# Patient Record
Sex: Female | Born: 1935
Health system: Southern US, Community
[De-identification: ages and names within clinical notes are randomized; demographics above are authoritative.]

## PROBLEM LIST (undated history)

## (undated) DIAGNOSIS — J309 Allergic rhinitis, unspecified: Secondary | ICD-10-CM

## (undated) DIAGNOSIS — H8110 Benign paroxysmal vertigo, unspecified ear: Secondary | ICD-10-CM

## (undated) DIAGNOSIS — K869 Disease of pancreas, unspecified: Secondary | ICD-10-CM

## (undated) DIAGNOSIS — Z780 Asymptomatic menopausal state: Secondary | ICD-10-CM

## (undated) DIAGNOSIS — I10 Essential (primary) hypertension: Secondary | ICD-10-CM

## (undated) DIAGNOSIS — K219 Gastro-esophageal reflux disease without esophagitis: Secondary | ICD-10-CM

## (undated) DIAGNOSIS — E119 Type 2 diabetes mellitus without complications: Secondary | ICD-10-CM

## (undated) DIAGNOSIS — I2510 Atherosclerotic heart disease of native coronary artery without angina pectoris: Secondary | ICD-10-CM

## (undated) DIAGNOSIS — M6580 Other synovitis and tenosynovitis, unspecified site: Secondary | ICD-10-CM

## (undated) DIAGNOSIS — I350 Nonrheumatic aortic (valve) stenosis: Secondary | ICD-10-CM

## (undated) DIAGNOSIS — M8580 Other specified disorders of bone density and structure, unspecified site: Secondary | ICD-10-CM

## (undated) DIAGNOSIS — M069 Rheumatoid arthritis, unspecified: Secondary | ICD-10-CM

## (undated) DIAGNOSIS — D649 Anemia, unspecified: Secondary | ICD-10-CM

## (undated) DIAGNOSIS — M1990 Unspecified osteoarthritis, unspecified site: Secondary | ICD-10-CM

## (undated) DIAGNOSIS — M4800 Spinal stenosis, site unspecified: Secondary | ICD-10-CM

## (undated) DIAGNOSIS — E782 Mixed hyperlipidemia: Secondary | ICD-10-CM

## (undated) DIAGNOSIS — M659 Synovitis and tenosynovitis, unspecified: Secondary | ICD-10-CM

## (undated) DIAGNOSIS — I219 Acute myocardial infarction, unspecified: Secondary | ICD-10-CM

## (undated) DIAGNOSIS — R918 Other nonspecific abnormal finding of lung field: Secondary | ICD-10-CM

## (undated) HISTORY — DX: Other synovitis and tenosynovitis, unspecified site: M65.80

## (undated) HISTORY — DX: Mixed hyperlipidemia: E78.2

## (undated) HISTORY — PX: APPENDECTOMY: SHX54

## (undated) HISTORY — DX: Essential (primary) hypertension: I10

## (undated) HISTORY — DX: Asymptomatic menopausal state: Z78.0

## (undated) HISTORY — DX: Benign paroxysmal vertigo, unspecified ear: H81.10

## (undated) HISTORY — DX: Allergic rhinitis, unspecified: J30.9

## (undated) HISTORY — PX: TONSILLECTOMY: SUR1361

## (undated) HISTORY — DX: Spinal stenosis, site unspecified: M48.00

## (undated) HISTORY — DX: Atherosclerotic heart disease of native coronary artery without angina pectoris: I25.10

## (undated) HISTORY — DX: Synovitis and tenosynovitis, unspecified: M65.9

## (undated) HISTORY — DX: Other specified disorders of bone density and structure, unspecified site: M85.80

## (undated) HISTORY — PX: EYE SURGERY: SHX253

## (undated) HISTORY — PX: OTHER SURGICAL HISTORY: SHX169

## (undated) HISTORY — PX: DILATION AND CURETTAGE OF UTERUS: SHX78

## (undated) HISTORY — DX: Rheumatoid arthritis, unspecified: M06.9

## (undated) HISTORY — PX: ABDOMINAL HYSTERECTOMY: SHX81

---

## 1999-02-17 ENCOUNTER — Ambulatory Visit (HOSPITAL_BASED_OUTPATIENT_CLINIC_OR_DEPARTMENT_OTHER): Admission: RE | Admit: 1999-02-17 | Discharge: 1999-02-17 | Payer: Self-pay | Admitting: Orthopedic Surgery

## 2001-09-07 ENCOUNTER — Inpatient Hospital Stay (HOSPITAL_COMMUNITY): Admission: RE | Admit: 2001-09-07 | Discharge: 2001-09-08 | Payer: Self-pay | Admitting: Internal Medicine

## 2001-09-07 ENCOUNTER — Encounter (INDEPENDENT_AMBULATORY_CARE_PROVIDER_SITE_OTHER): Payer: Self-pay | Admitting: Specialist

## 2001-09-07 ENCOUNTER — Encounter: Payer: Self-pay | Admitting: Internal Medicine

## 2001-10-02 ENCOUNTER — Encounter: Payer: Self-pay | Admitting: Surgery

## 2001-10-02 ENCOUNTER — Ambulatory Visit (HOSPITAL_COMMUNITY): Admission: RE | Admit: 2001-10-02 | Discharge: 2001-10-02 | Payer: Self-pay | Admitting: Surgery

## 2002-02-18 ENCOUNTER — Encounter: Payer: Self-pay | Admitting: Surgery

## 2002-02-18 ENCOUNTER — Ambulatory Visit (HOSPITAL_COMMUNITY): Admission: RE | Admit: 2002-02-18 | Discharge: 2002-02-18 | Payer: Self-pay | Admitting: Surgery

## 2002-05-30 DIAGNOSIS — I219 Acute myocardial infarction, unspecified: Secondary | ICD-10-CM

## 2002-05-30 HISTORY — PX: CARDIAC CATHETERIZATION: SHX172

## 2002-05-30 HISTORY — PX: CORONARY ANGIOPLASTY: SHX604

## 2002-05-30 HISTORY — DX: Acute myocardial infarction, unspecified: I21.9

## 2003-01-14 ENCOUNTER — Inpatient Hospital Stay (HOSPITAL_COMMUNITY): Admission: RE | Admit: 2003-01-14 | Discharge: 2003-01-15 | Payer: Self-pay | Admitting: *Deleted

## 2003-01-27 ENCOUNTER — Encounter (HOSPITAL_COMMUNITY): Admission: RE | Admit: 2003-01-27 | Discharge: 2003-02-27 | Payer: Self-pay | Admitting: *Deleted

## 2004-09-27 ENCOUNTER — Encounter (INDEPENDENT_AMBULATORY_CARE_PROVIDER_SITE_OTHER): Payer: Self-pay | Admitting: *Deleted

## 2004-09-27 ENCOUNTER — Ambulatory Visit (HOSPITAL_COMMUNITY): Admission: RE | Admit: 2004-09-27 | Discharge: 2004-09-27 | Payer: Self-pay | Admitting: Gastroenterology

## 2004-10-28 ENCOUNTER — Encounter: Admission: RE | Admit: 2004-10-28 | Discharge: 2004-10-28 | Payer: Self-pay | Admitting: Orthopaedic Surgery

## 2004-11-16 ENCOUNTER — Ambulatory Visit (HOSPITAL_COMMUNITY): Admission: RE | Admit: 2004-11-16 | Discharge: 2004-11-16 | Payer: Self-pay | Admitting: Orthopaedic Surgery

## 2008-05-09 ENCOUNTER — Encounter: Admission: RE | Admit: 2008-05-09 | Discharge: 2008-05-09 | Payer: Self-pay | Admitting: Internal Medicine

## 2008-10-09 ENCOUNTER — Ambulatory Visit (HOSPITAL_COMMUNITY): Admission: RE | Admit: 2008-10-09 | Discharge: 2008-10-09 | Payer: Self-pay | Admitting: Orthopaedic Surgery

## 2010-01-05 ENCOUNTER — Encounter: Admission: RE | Admit: 2010-01-05 | Discharge: 2010-01-05 | Payer: Self-pay | Admitting: Internal Medicine

## 2010-06-20 ENCOUNTER — Encounter: Payer: Self-pay | Admitting: Internal Medicine

## 2010-09-07 LAB — BASIC METABOLIC PANEL
BUN: 12 mg/dL (ref 6–23)
CO2: 30 mEq/L (ref 19–32)
Calcium: 9.6 mg/dL (ref 8.4–10.5)
Chloride: 99 mEq/L (ref 96–112)
Creatinine, Ser: 0.81 mg/dL (ref 0.4–1.2)
GFR calc Af Amer: >60 mL/min (ref >60)
GFR calc non Af Amer: >60 mL/min (ref >60)
Glucose, Bld: 132 mg/dL — ABNORMAL HIGH (ref 70–99)
Potassium: 3.9 mEq/L (ref 3.5–5.1)
Sodium: 137 mEq/L (ref 135–145)

## 2010-09-07 LAB — CBC
HCT: 42.8 % (ref 36.0–46.0)
Hemoglobin: 14.5 g/dL (ref 12.0–15.0)
MCHC: 33.8 g/dL (ref 30.0–36.0)
MCV: 88.5 fL (ref 78.0–100.0)
Platelets: 297 10*3/uL (ref 150–400)
RBC: 4.83 MIL/uL (ref 3.87–5.11)
RDW: 13.3 % (ref 11.5–15.5)
WBC: 12.3 10*3/uL — ABNORMAL HIGH (ref 4.0–10.5)

## 2010-10-12 NOTE — Op Note (Signed)
Sophia Ward, Sophia Ward            ACCOUNT NO.:  [id number]   MEDICAL RECORD NO.:  [id number]          PATIENT TYPE:  AMB   LOCATION:  SDS                          FACILITY:  [facility]   PHYSICIAN:  Lubertha Basque. Dalldorf, M.D.DATE OF BIRTH:  03-03-1933   DATE OF PROCEDURE:  10/09/2008  DATE OF DISCHARGE:  10/09/2008                               OPERATIVE REPORT   POSTOPERATIVE DIAGNOSES:  1. Right knee torn medial meniscus.  2. Right knee chondromalacia.   POSTOPERATIVE DIAGNOSES:  1. Right knee torn medial and torn lateral meniscus.  2. Right knee chondromalacia.   PROCEDURES:  1. Right knee partial medial and partial lateral meniscectomy.  2. Right knee abrasion chondroplasty patellofemoral.   ANESTHESIA:  General.   ATTENDING SURGEON:  Lubertha Basque. Jerl Santos, MD   ASSISTANT:  Lindwood Qua, PA   INDICATIONS FOR PROCEDURE:  The patient is a 78 year old woman with many  months of right knee pain and swelling.  This has persisted despite oral  anti-inflammatories and rest and bracing.  She has undergone an MRI scan  which shows a medial meniscus tear and some mild degenerative changes.  She had good joint space maintenance on plain films and our hope is that  an arthroscopy will improve her situation significantly.  Informed  operative consent was obtained after discussion of possible  complications including reaction to anesthesia and infection.   SUMMARY OF FINDINGS AND PROCEDURE:  Under general anesthesia, an  arthroscopy of the right knee was performed.  This was accomplished in  the main operating room due to some cardiac concerns.  She had a  posterior horn medial meniscus tear and a free edge lateral meniscus  tear both of which were addressed with partial meniscectomies.  She had  some chondromalacia grade 3 medial and grade 4 patellofemoral addressed  with thorough chondroplasties and abrasion to bleeding bone  patellofemoral.   DESCRIPTION OF PROCEDURE:  The patient was  taken to the operating suite  where a general anesthetic was applied without difficulty.  She was  positioned supine and prepped and draped in normal sterile fashion.  After the administration of IV Kefzol, an arthroscopy of the right knee  was performed with two portals.  Findings were as noted above and  procedure consisted of a partial medial and partial lateral  meniscectomies done with basket and shaver removing no more than 5% of  these structures.  A thorough chondroplasty was done medial and  patellofemoral along with abrasion to bleeding bone patellofemoral in  some small areas.  The knee was thoroughly irrigated followed by  placement of a sterile dressing and Ace wrap.  Estimated blood loss and  intraoperative fluid obtained from anesthesia records.   DISPOSITION:  The patient was extubated in the operating room and taken  to recovery room in stable addition.  Plans were for her to go home the  same-day and follow up in the office next week.  I will contact her by  phone tonight.      Lubertha Basque Jerl Santos, M.D.  Electronically Signed     PGD/MEDQ  D:  10/09/2008  T:  10/09/2008  Job:  [id number]

## 2010-10-15 NOTE — Op Note (Signed)
Sophia Ward, Sophia Ward            ACCOUNT NO.:  [id number]   MEDICAL RECORD NO.:  [id number]          PATIENT TYPE:  OIB   LOCATION:  2892                         FACILITY:  [facility]   PHYSICIAN:  Lubertha Basque. Dalldorf, M.D.DATE OF BIRTH:  1932-09-22   DATE OF PROCEDURE:  11/16/2004  DATE OF DISCHARGE:                                 OPERATIVE REPORT   PREOPERATIVE DIAGNOSES:  1. Right shoulder impingement.  2. Right shoulder partial rotator cuff tear.   POSTOPERATIVE DIAGNOSES:  1. Right shoulder impingement.  2. Right shoulder partial rotator cuff tear.   PROCEDURES:  1. Right shoulder arthroscopic acromioplasty.  2. Right shoulder arthroscopic debridement of partial rotator cuff tear      and partial subscapularis tear.   ANESTHESIA:  General.   ATTENDING SURGEON:  Lubertha Basque. Jerl Santos, M.D.   INDICATIONS FOR PROCEDURE:  The patient is a 77 year old woman with a long  history of bilateral shoulder pain. The left shoulder has done well with two  subacromial injections and an exercise program while on the right she has  not responded to subacromial injection nor to the exercise program. She has  persisted with pain at rest and pain with activities, especially with  overhead use. She has undergone an MRI scan of the right shoulder which  shows some irritation of her rotator cuff consistent with impingement, but  certainly no full-thickness rotator cuff tear. She is offered an  arthroscopy. Informed operative consent was obtained after discussion of  possible complications of reaction to anesthesia and infection.   DESCRIPTION OF PROCEDURE:  The patient was taken to the operating suite  where general anesthetic was supplied without difficulty. She was positioned  in a beach chair position and prepped and draped in normal sterile fashion.  After the administration of IV antibiotic, which was clindamycin, an  arthroscopy of the right shoulder was performed through a total of three  portals. The glenohumeral joint showed no degenerative changes and the  biceps tendon appeared intact. She did have some partial tearing of her  subscapularis and a debridement was done through the additional anterior  portal, sacrificing about 10% of this tendon back to stable tissues. In the  rotator cuff at the insertion site, there was also some fraying consistent  with a partial-thickness tear constituting approximately 25% of the  thickness of the rotator cuff. This too was debrided. In the subacromial  space, no significant tearing could be seen. She had a prominent subacromial  morphology addressed with an acromioplasty back to a flat surface. This was  done with a bur in the lateral position followed by transfer of the bur to  the posterior position. A bursectomy was performed and the cuff again was  examined. No full-thickness nor significant partial-thickness tear from the  bursal side was seen. She did have a mild prominence of the distal clavicle,  which I excised, but I did not perform an AC decompression formally. The  shoulder was thoroughly irrigated, followed by placement of  Marcaine  without epinephrine. Simple sutures of nylon were used to loosely  reapproximate the portals, followed by Adaptic and  dry gauze dressing with  tape. The estimated blood loss and intraoperative fluids can be obtained  from the anesthesia records.   DISPOSITION:  The patient was extubated in the operating room and taken to  the recovery room in stable addition. Plans were for her to go home the same  day and follow up in the office in less than a week. I will contact her by  phone tonight.       PGD/MEDQ  D:  11/16/2004  T:  11/16/2004  Job:  [id number]

## 2010-10-15 NOTE — Discharge Summary (Signed)
Sophia Ward, Sophia Ward                      ACCOUNT NO.:  [id number]   MEDICAL RECORD NO.:  [id number]                   PATIENT TYPE:  INP   LOCATION:  6527                                 FACILITY:  [facility]   PHYSICIAN:  Meade Maw, M.D.                 DATE OF BIRTH:  1933-02-13   DATE OF ADMISSION:  01/13/2003  DATE OF DISCHARGE:  01/15/2003                                 DISCHARGE SUMMARY   ADMISSION DIAGNOSES:  1. Recurrent angina.  2. Coronary artery disease status post left anterior descending and right     coronary artery PCI.  3. Hyperlipidemia.  4. Hypertension.   DISCHARGE DIAGNOSES:  1. Cardiac catheterization, elective, revealing left anterior descending in-     stent restenosis as well as progressive ________ left anterior descending     disease.  2. Vasovagal syncope, resolved.  3. Recurrent angina.  4. Coronary artery disease status post left anterior descending and right     coronary artery PCI.  5. Hyperlipidemia.  6. Hypertension.   HISTORY OF PRESENT ILLNESS:  Sophia Ward is a very pleasant 77 year old  married white female with known coronary artery disease.  She has had a  Cypher stent placed in the LAD and RCA Vision stenting.  She had a stress  test following these interventions (which were performed in [place], [place])  on December 04, 2002 and this revealed minimal decreased uptake in the anterior  wall with minimal reversal noted.  May be a residual effect from her  previously occluded LAD.  Normal wall motion with an EF of 75%.  In that the  patient had not had any typical angina, will not proceed with further work-  up at this time.   The patient was seen again on January 07, 2003 by Meade Maw, M.D. and at  this time was complaining of recurrent chest pain with exertion resolved  with rest.  No associated nausea, vomiting, or diaphoresis.  The pain lasts  less than five minutes in nature and she has not required any nitroglycerin.  However,  this has been ongoing for the last two to three weeks.   Based on this information, abnormal Cardiolite in June, and known coronary  artery disease, Meade Maw, M.D. recommended repeat cardiac  catheterization.  She also recommended Imdur therapy and continuation of  aspirin and Plavix.  The patient was to make a decision about whether or not  she wanted to proceed with repeat catheterization.  She ultimately decided  to do so.  The risks and indications were reviewed with the patient and she  agreed to proceed.   PROCEDURE:  1. Cardiac catheterization by Meade Maw, M.D. on January 13, 2003     revealing left anterior descending disease.  2. Percutaneous intervention by Francisca December, M.D. on January 13, 2003 to     the left anterior descending.   COMPLICATIONS:  None.   CONSULTATIONS:  Cardiac rehabilitation.   HOSPITAL COURSE:  Sophia Ward was admitted to [facility] for  elective cardiac catheterization on January 13, 2003 by Meade Maw, M.D.  Pre procedure laboratory studies were within acceptable limits.   Cardiac catheterization performed by Meade Maw, M.D. revealed a left  main that bifurcated into the LAD and circumflex.  The LAD had 80-90% in-  stent restenosis and had progression of disease towards the proximal portion  of the LAD involving a diagonal 1.  The circumflex had a 20-30% proximal  lesion.  The RCA had 20% proximal lesion.  The stented segment remained  patent.  LV gram revealed an EF of 65-70% and aortic pressures were 158/74.   She consulted with Francisca December, M.D. who agreed to perform intervention  to the LAD.  He performed balloon angioplasty to the in-stent restenosis and  placed a Taxus 3.0 x 20 mm stent in the proximal portion of the LAD.  Typical angina was reproduced.  The patient tolerated procedure well and had  good distal pulses.  Angiomax was used during the procedure.   On January 14, 2003 the patient stated that she was  feeling okay, felt a  little bit gassy/indigestion type symptoms.  Right groin was stable without  hematoma or bruit and minimal ecchymosis.   The patient ambulated in the hallway and tolerated this well.  Because she  did, she was allowed to go and visit her husband on the 5[address] unit by  wheelchair with family.  He was admitted on the same day for abdominal pain.  Apparently, when the patient returned she felt lightheaded, pale, and  clammy.  The patient became hot and nauseated.  When the patient was  returned to her room she had a syncopal spell while sitting in the  wheelchair.  She was placed back in bed by the nursing staff and put in  Trendelenburg.  Blood pressure was 138/57.  Heart rate was 48.  Meade Maw, M.D. was called to see the patient.  Meade Maw, M.D. felt the  patient had a vasovagal episode and recommended additional 24-hour inpatient  monitoring.   The patient had no further episodes of lightheadedness, dizziness, or  syncope.   On January 14, 2003 she was seen by cardiac rehabilitation and ambulated well  without any chest discomfort, lightheadedness, or dizziness.  Hemodynamically stable.   On January 15, 2003 the patient was felt stable for discharge to home by  Meade Maw, M.D.  Right groin stable with trace ecchymosis.   DISCHARGE MEDICATIONS:  1. Altace 10 mg b.i.d.  2. Toprol XL 100 mg daily.  3. Hydrochlorothiazide 25 mg.  4. Aspirin 325 mg.  5. Plavix 75 mg.  6. Prevacid 30 mg.  7. Zocor 40 mg at bedtime.  8. Nitroglycerin 0.4 mg one under the tongue every five minutes as needed     for chest pain.  9. TriCor 160 mg daily.   ACTIVITY:  No strenuous activity, lifting more than 5 pounds, or driving for  two days.   DIET:  Low fat, low cholesterol, low salt diet.   WOUND CARE:  May shower.   DISCHARGE INSTRUCTIONS:  Call the office with problems or questions.   FOLLOWUP:  With Dr Fraser Din on Tuesday, August 31 at 1:00.      Georgiann Cocker  Jernejcic, P.A.                   Meade Maw, M.D.  TCJ/MEDQ  D:  01/15/2003  T:  01/16/2003  Job:  [id number]   cc:   Theressa Millard, M.D.  [address] [place]  [place] [address]  Fax: [phone]

## 2010-10-15 NOTE — Op Note (Signed)
[name], [name]            ACCOUNT NO.:  [id number]   MEDICAL RECORD NO.:  [id number]          PATIENT TYPE:  AMB   LOCATION:  ENDO                         FACILITY:  [facility]   PHYSICIAN:  Danise Edge, M.D.   DATE OF BIRTH:  1932-07-02   DATE OF PROCEDURE:  09/27/2004  DATE OF DISCHARGE:                                 OPERATIVE REPORT   PROCEDURE:  Esophagogastroduodenoscopy colonoscopy and sigmoid polypectomy.   INDICATIONS:  Ms. Sophia Ward is a 77 year old female born May 25, 1933. Sophia Ward has chronic gastroesophageal reflux disease (heartburn)  associated with intermittent dysphagia. She is currently taking Prevacid and  for the most part is heartburn free. She is also experiencing right lower  quadrant abdominal discomfort and is due for a screening colonoscopy with  polypectomy to prevent colon cancer.   ENDOSCOPIST:  Danise Edge, M.D.   PREMEDICATION:  Versed 7 mg, Demerol 70 mg.   PROCEDURE:  DIAGNOSTIC ESOPHAGOGASTRODUODENOSCOPY:  After obtaining informed  consent, Sophia Ward was placed in the left lateral decubitus position. I  administered intravenous Demerol and intravenous Versed to achieve conscious  sedation for the procedure. The patient's blood pressure, oxygen saturation  and cardiac rhythm were monitored throughout the procedure and documented in  the medical record.   The Olympus gastroscope was passed through the posterior hypopharynx into  the proximal esophagus without difficulty. The hypopharynx, larynx and vocal  cords appeared normal.   ESOPHAGOSCOPY:  The proximal, mid and lower segments of the esophageal  mucosa appear completely normal. The squamocolumnar junction and  esophagogastric junction are noted at 38 cm from the incisor teeth. There is  no endoscopic evidence for the presence of Barrett's esophagus, erosive  esophagitis or esophageal mucosal scarring.   GASTROSCOPY:  Retroflex view of the gastric cardia and  fundus was normal.  The gastric body, antrum and pylorus appeared normal.   DUODENOSCOPY:  The duodenal bulb and descending duodenum appear normal.   ASSESSMENT:  Chronic gastroesophageal reflux associated with a completely  normal esophagogastroduodenoscopy.   PROCEDURE:  COLONOSCOPY AND SIGMOID POLYPECTOMY:  Anal inspection and  digital rectal exam were normal. The Olympus adjustable pediatric  colonoscope was introduced into the rectum and easily advanced to the cecum.  A normal appearing ileocecal valve and appendiceal orifice were identified.  Colonic preparation for the exam today was excellent.   RECTUM:  Normal. Retroflexed view of the distal rectum normal.  SIGMOID COLON AND DESCENDING COLON:  From the distal sigmoid colon at 15 cm  from the anal verge, a 1 mm - 2 mm size polyp was removed with cold biopsy  forceps.  SPLENIC FLEXURE:  Normal.  TRANSVERSE COLON:  Normal.  HEPATIC FLEXURE:  Normal.  ASCENDING COLON:  Normal.  CECUM AND ILEOCECAL VALVE:  Normal.   ASSESSMENT:  A diminutive polyp was removed from the distal sigmoid colon;  otherwise normal proctocolonoscopy to the cecum.      MJ/MEDQ  D:  09/27/2004  T:  09/27/2004  Job:  [id number]   cc:   Theressa Millard, M.D.  [address] [place]  [place] [address]  Fax: [phone]

## 2010-10-15 NOTE — H&P (Signed)
NAMEFRONIE, HOLSTEIN                      ACCOUNT NO.:  [id number]   MEDICAL RECORD NO.:  [id number]                   PATIENT TYPE:  INP   LOCATION:  6527                                 FACILITY:  [facility]   PHYSICIAN:  Meade Maw, M.D.                 DATE OF BIRTH:  03/06/1933   DATE OF ADMISSION:  01/13/2003  DATE OF DISCHARGE:  01/15/2003                                HISTORY & PHYSICAL   ADMISSION DIAGNOSES:  1. Recurrent angina.  2. Abnormal Cardiolite in June 2004.  3. Known coronary artery disease.  4. Hyperlipidemia.  5. Hypertension.   HISTORY OF PRESENT ILLNESS:  Ms. Sophia Ward is a very pleasant 77 year old  married white female with known coronary artery disease.  She actually had  LAD and RCA intervention in [place], [place] in March 2004.  The patient then  became established with Meade Maw, M.D. at the end of March who  recommended Cardiolite stress testing in June prior to recommending an  exercise program.  She also recommended continuation on Plavix for six  months in view of her Cypher stent deployment.   The stress test was performed on November 04, 2002 and revealed minimal decreased  uptake in the mid anterior wall with no reversal noted.  This may be  residual effect from a previous occluded LAD.  Normal wall motion with EF  75%.  In that the patient had not had any typical angina, would not proceed  with further work-up at this time.  Of note, the patient exercised for a  total of 5 minutes, 31 seconds and baseline EKG revealed sinus rhythm with  65 beats per minute.  There was 1 mm horizontal ST segment depression noted  with exercise.   The patient remained stable throughout the summer, but presented to Meade Maw, M.D. on January 07, 2003 with complaints of recurrent exertional  chest pain for the last two to three weeks.  Resolved with rest.  No  associated nausea, vomiting, or diaphoresis.  She did not require any  nitroglycerin.   Based  on this information Meade Maw, M.D. recommended proceeding with  cardiac catheterization to define coronary anatomy.  However, the patient  wanted to continue medical therapy at this time.  We asked her to contact us  when she was ready to proceed with catheterization.  In the interim Meade Maw, M.D. started Imdur, continued Plavix and Ecotrin.   The patient ultimately decided to undergo cardiac catheterization and this  was scheduled for January 13, 2003 with Meade Maw, M.D.   ALLERGIES:  CODEINE, PENICILLIN.   MEDICATIONS:  1. Altace 10 mg __________.  2. Plavix 75 mg.  3. Premarin 1.25 mg.  4. Ecotrin 325 mg daily.  5. Toprol XL 100 mg daily.  6. Zocor 40 mg.  7. Prevacid 30 mg.  8. Vitamin E.  9. Hydrochlorothiazide 25 mg daily.  10.  TriCor 160 mg daily.  11.      Imdur 30 mg daily.   PAST MEDICAL HISTORY:  1. Coronary artery disease, catheterization in [place], [place] March 2004     with placement of a 2.5 x 18 mm Cypher stent in the LAD and a 4 x 12 mm     Vision stent placed in the RCA.  2. Hypertension.  3. Dyslipidemia.   PAST SURGICAL HISTORY:  1. Tonsillectomy.  2. Hysterectomy.  3. Right carpal tunnel syndrome surgery.  4. Appendectomy.   SOCIAL HISTORY:  The patient is married.  Lives with her husband.  They  enjoy RV'ing.  They have traveled over most of the country.   FAMILY HISTORY:  Significant for cardiovascular disease.  Mother had an MI  at 58.  Father had an MI at 66.   REVIEW OF SYSTEMS:  The patient does have positive indigestion and joint  pain.  She has had exertional chest pain, but no nausea, vomiting, or  diaphoresis.  No bleeding or blood dyscrasias.  Otherwise negative.   PHYSICAL EXAMINATION:  Performed by Meade Maw, M.D. on January 07, 2003  VITAL SIGNS:  Blood pressure 148/72, heart rate 68, weight 174.  HEENT:  Normocephalic, atraumatic.  PERL.  EOMI.  NECK:  Supple without bruits or masses.  PULMONARY:  Reveals  breath sounds which are equal and clear to auscultation.  No use of accessory muscles.  ABDOMEN:  Soft, nontender.  Positive bowel sounds.  CARDIOVASCULAR:  Regular rate and rhythm without murmur, rub, or gallop.  EXTREMITIES:  Without edema.  Distal pulses intact.    IMPRESSION:  Meade Maw, M.D. recommends proceeding with cardiac  catheterization in view of angina, abnormal stress test in August, and known  coronary artery disease.  Risks and indications for the procedure have been  reviewed and the patient agrees to proceed.      Georgiann Cocker Jernejcic, P.A.                   Meade Maw, M.D.    TCJ/MEDQ  D:  01/15/2003  T:  01/15/2003  Job:  [id number]   cc:   Theressa Millard, M.D.  [address] [place]  [place] [address]  Fax: [phone]

## 2010-10-15 NOTE — Cardiovascular Report (Signed)
[name], Sophia Ward                      ACCOUNT NO.:  [id number]   MEDICAL RECORD NO.:  [id number]                   PATIENT TYPE:  OIB   LOCATION:  6527                                 FACILITY:  [facility]   PHYSICIAN:  Meade Maw, M.D.                 DATE OF BIRTH:  09-07-1932   DATE OF PROCEDURE:  01/13/2003  DATE OF DISCHARGE:                              CARDIAC CATHETERIZATION   INDICATION FOR PROCEDURE:  Recurrent angina.  The patient has known coronary  artery disease status post Cypher stent deployment in her proximal LAD and a  Vision stent in the proximal portion of her right.  Stress Cardiolite  reveals reversible ischemia in the anterior wall.   PROCEDURE:  After obtaining written informed consent, the patient was  brought to the cardiac catheterization lab in a postabsorptive state.  Preop  sedation was achieved using IV Versed.  The patient was also given 50 mcg of  fentanyl.  The right groin was prepped and draped in the usual sterile  fashion.  Local anesthesia was achieved using 1% Xylocaine.  A 6 French  hemostasis sheath was placed into the right femoral artery using the  modified Seldinger technique.  Selective coronary angiography was performed  using a JL-4 and a non-Torque right.  Single plane ventriculogram was  performed in the RAO position using a 6 French pigtail curved catheter.  All catheter exchanges were made over guidewire.  The hemostasis sheath was  flushed following each engagement.   FINDINGS:  1. Aortic pressure was 158/74.  2. LV pressure 162/5.  3. Single plane ventriculogram revealed normal wall motion.  Ejection     fraction of 65-70%.   CORONARY ANGIOGRAPHY:  1. The left main coronary artery bifurcates into the left anterior     descending and circumflex vessel.  There is no disease noted in the left     main coronary artery.  2. Left anterior descending:  Left anterior descending gives rise to a large     D1, goes on in as an  apical branch.  There is an 80-90% in-stent stenosis     which progresses to the proximal LAD.  This also involves a large first     diagonal.  3. Circumflex vessel:  Circumflex vessel is a moderate size vessel.  There     is 20-30% ostial lesion noted.  4. Right coronary artery:  Right coronary artery is a large dominant artery     giving rise to two RV marginals, PDA and PL branch.  There was     significant spasm noted of the ostium of the right coronary artery.     There was a 20% ostial lesion noted.   FINAL IMPRESSION:  1. In-stent stenosis of a Cypher stent in the proximal left anterior     descending.  This extends into the proximal left anterior descending.  2. Patent stent in the right  coronary artery.  3. Normal circumflex.  4. Preserved left ventricular function.    RECOMMENDATIONS:  The films have been reviewed with Dr. Amil Amen.  He will  proceed with percutaneous revascularization.                                                Meade Maw, M.D.    HP/MEDQ  D:  01/13/2003  T:  01/14/2003  Job:  [id number]

## 2010-12-10 ENCOUNTER — Other Ambulatory Visit: Payer: Self-pay | Admitting: Internal Medicine

## 2010-12-10 DIAGNOSIS — Z1231 Encounter for screening mammogram for malignant neoplasm of breast: Secondary | ICD-10-CM

## 2011-01-10 ENCOUNTER — Ambulatory Visit
Admission: RE | Admit: 2011-01-10 | Discharge: 2011-01-10 | Disposition: A | Discharge status: Home / Self Care | Payer: Medicare Other | Source: Ambulatory Visit | Attending: Internal Medicine | Admitting: Internal Medicine

## 2011-01-10 DIAGNOSIS — Z1231 Encounter for screening mammogram for malignant neoplasm of breast: Secondary | ICD-10-CM

## 2011-06-22 DIAGNOSIS — R059 Cough, unspecified: Secondary | ICD-10-CM | POA: Diagnosis not present

## 2011-06-22 DIAGNOSIS — R05 Cough: Secondary | ICD-10-CM | POA: Diagnosis not present

## 2011-06-22 DIAGNOSIS — J1189 Influenza due to unidentified influenza virus with other manifestations: Secondary | ICD-10-CM | POA: Diagnosis not present

## 2011-07-04 DIAGNOSIS — R059 Cough, unspecified: Secondary | ICD-10-CM | POA: Diagnosis not present

## 2011-07-04 DIAGNOSIS — R05 Cough: Secondary | ICD-10-CM | POA: Diagnosis not present

## 2011-07-07 DIAGNOSIS — R059 Cough, unspecified: Secondary | ICD-10-CM | POA: Diagnosis not present

## 2011-07-07 DIAGNOSIS — R05 Cough: Secondary | ICD-10-CM | POA: Diagnosis not present

## 2011-10-17 DIAGNOSIS — Z1331 Encounter for screening for depression: Secondary | ICD-10-CM | POA: Diagnosis not present

## 2011-10-17 DIAGNOSIS — Z0000 Encounter for general adult medical examination without abnormal findings: Secondary | ICD-10-CM | POA: Diagnosis not present

## 2011-10-17 DIAGNOSIS — I10 Essential (primary) hypertension: Secondary | ICD-10-CM | POA: Diagnosis not present

## 2011-10-17 DIAGNOSIS — E119 Type 2 diabetes mellitus without complications: Secondary | ICD-10-CM | POA: Diagnosis not present

## 2011-10-17 DIAGNOSIS — M899 Disorder of bone, unspecified: Secondary | ICD-10-CM | POA: Diagnosis not present

## 2011-11-03 DIAGNOSIS — L821 Other seborrheic keratosis: Secondary | ICD-10-CM | POA: Diagnosis not present

## 2011-11-03 DIAGNOSIS — L570 Actinic keratosis: Secondary | ICD-10-CM | POA: Diagnosis not present

## 2011-11-14 DIAGNOSIS — E119 Type 2 diabetes mellitus without complications: Secondary | ICD-10-CM | POA: Diagnosis not present

## 2011-12-12 DIAGNOSIS — E782 Mixed hyperlipidemia: Secondary | ICD-10-CM | POA: Diagnosis not present

## 2011-12-12 DIAGNOSIS — I2510 Atherosclerotic heart disease of native coronary artery without angina pectoris: Secondary | ICD-10-CM | POA: Diagnosis not present

## 2011-12-12 DIAGNOSIS — I10 Essential (primary) hypertension: Secondary | ICD-10-CM | POA: Diagnosis not present

## 2011-12-12 DIAGNOSIS — R0989 Other specified symptoms and signs involving the circulatory and respiratory systems: Secondary | ICD-10-CM | POA: Diagnosis not present

## 2011-12-20 ENCOUNTER — Other Ambulatory Visit: Payer: Self-pay | Admitting: Internal Medicine

## 2011-12-20 DIAGNOSIS — Z1231 Encounter for screening mammogram for malignant neoplasm of breast: Secondary | ICD-10-CM

## 2012-01-06 DIAGNOSIS — R0989 Other specified symptoms and signs involving the circulatory and respiratory systems: Secondary | ICD-10-CM | POA: Diagnosis not present

## 2012-01-11 ENCOUNTER — Ambulatory Visit
Admission: RE | Admit: 2012-01-11 | Discharge: 2012-01-11 | Disposition: A | Discharge status: Home / Self Care | Payer: Medicare Other | Source: Ambulatory Visit | Attending: Internal Medicine | Admitting: Internal Medicine

## 2012-01-11 DIAGNOSIS — Z1231 Encounter for screening mammogram for malignant neoplasm of breast: Secondary | ICD-10-CM | POA: Diagnosis not present

## 2012-05-01 ENCOUNTER — Other Ambulatory Visit: Payer: Self-pay | Admitting: Internal Medicine

## 2012-05-01 DIAGNOSIS — R109 Unspecified abdominal pain: Secondary | ICD-10-CM

## 2012-05-01 DIAGNOSIS — D649 Anemia, unspecified: Secondary | ICD-10-CM | POA: Diagnosis not present

## 2012-05-01 DIAGNOSIS — I10 Essential (primary) hypertension: Secondary | ICD-10-CM | POA: Diagnosis not present

## 2012-05-01 DIAGNOSIS — E119 Type 2 diabetes mellitus without complications: Secondary | ICD-10-CM | POA: Diagnosis not present

## 2012-05-04 ENCOUNTER — Other Ambulatory Visit: Payer: Medicare Other

## 2012-05-04 ENCOUNTER — Ambulatory Visit
Admission: RE | Admit: 2012-05-04 | Discharge: 2012-05-04 | Disposition: A | Discharge status: Home / Self Care | Payer: Medicare Other | Source: Ambulatory Visit | Attending: Internal Medicine | Admitting: Internal Medicine

## 2012-05-04 DIAGNOSIS — L570 Actinic keratosis: Secondary | ICD-10-CM | POA: Diagnosis not present

## 2012-05-04 DIAGNOSIS — L820 Inflamed seborrheic keratosis: Secondary | ICD-10-CM | POA: Diagnosis not present

## 2012-05-04 DIAGNOSIS — R109 Unspecified abdominal pain: Secondary | ICD-10-CM

## 2012-05-04 DIAGNOSIS — K5730 Diverticulosis of large intestine without perforation or abscess without bleeding: Secondary | ICD-10-CM | POA: Diagnosis not present

## 2012-05-04 MED ORDER — IOHEXOL 300 MG/ML  SOLN
100.0000 mL | Freq: Once | INTRAMUSCULAR | Status: AC | PRN
  Administered 2012-05-04: 100 mL via INTRAVENOUS

## 2012-10-09 ENCOUNTER — Other Ambulatory Visit: Payer: Self-pay | Admitting: Gastroenterology

## 2012-10-15 ENCOUNTER — Encounter (HOSPITAL_COMMUNITY): Payer: Self-pay | Admitting: Pharmacy Technician

## 2012-10-17 ENCOUNTER — Encounter (HOSPITAL_COMMUNITY): Payer: Self-pay | Admitting: *Deleted

## 2012-10-17 DIAGNOSIS — D509 Iron deficiency anemia, unspecified: Secondary | ICD-10-CM | POA: Diagnosis not present

## 2012-10-17 DIAGNOSIS — E119 Type 2 diabetes mellitus without complications: Secondary | ICD-10-CM | POA: Diagnosis not present

## 2012-10-17 DIAGNOSIS — Z0000 Encounter for general adult medical examination without abnormal findings: Secondary | ICD-10-CM | POA: Diagnosis not present

## 2012-10-17 DIAGNOSIS — L608 Other nail disorders: Secondary | ICD-10-CM | POA: Diagnosis not present

## 2012-10-17 DIAGNOSIS — I10 Essential (primary) hypertension: Secondary | ICD-10-CM | POA: Diagnosis not present

## 2012-10-17 DIAGNOSIS — M899 Disorder of bone, unspecified: Secondary | ICD-10-CM | POA: Diagnosis not present

## 2012-10-17 DIAGNOSIS — M949 Disorder of cartilage, unspecified: Secondary | ICD-10-CM | POA: Diagnosis not present

## 2012-10-17 DIAGNOSIS — Z1331 Encounter for screening for depression: Secondary | ICD-10-CM | POA: Diagnosis not present

## 2012-10-17 DIAGNOSIS — M2550 Pain in unspecified joint: Secondary | ICD-10-CM | POA: Diagnosis not present

## 2012-10-17 NOTE — Progress Notes (Signed)
Office visit note from 10/17/2012 from Dr. Margie Ege from [facility] 12/12/2011, Chest PA/LAT from [facility] 05/04/2011, labs from 10/17/2012-Hemoglobin A1C, CBC with diff.,Comp Metabolic Panel,Lipid panel w/direct LDL, Microalbumin panel all on chart.

## 2012-10-18 NOTE — Progress Notes (Signed)
lov note dr Gloriajean Dell cardiology 12-12-2011 on chart ekg 12-02-2011 dr Gloriajean Dell on chart Carotid  Artery duplex 01-06-2012 [facility] physicians on chart

## 2012-10-26 DIAGNOSIS — L570 Actinic keratosis: Secondary | ICD-10-CM | POA: Diagnosis not present

## 2012-10-26 DIAGNOSIS — D485 Neoplasm of uncertain behavior of skin: Secondary | ICD-10-CM | POA: Diagnosis not present

## 2012-10-26 DIAGNOSIS — L821 Other seborrheic keratosis: Secondary | ICD-10-CM | POA: Diagnosis not present

## 2012-10-29 NOTE — Anesthesia Preprocedure Evaluation (Addendum)
Anesthesia Evaluation  Patient identified by MRN, date of birth, ID band Patient awake    Reviewed: Allergy & Precautions, H&P , NPO status , Patient's Chart, lab work & pertinent test results  Airway Mallampati: II TM Distance: >3 FB Neck ROM: Full    Dental  (+) Caps, Teeth Intact and Dental Advisory Given   Pulmonary neg pulmonary ROS,  breath sounds clear to auscultation  Pulmonary exam normal       Cardiovascular hypertension, Pt. on medications + CAD, + Past MI and + Cardiac Stents Rhythm:Regular Rate:Normal     Neuro/Psych negative neurological ROS  negative psych ROS   GI/Hepatic negative GI ROS, Neg liver ROS,   Endo/Other  diabetes, Type 2, Oral Hypoglycemic Agents  Renal/GU negative Renal ROS  negative genitourinary   Musculoskeletal negative musculoskeletal ROS (+)   Abdominal   Peds  Hematology  (+) Blood dyscrasia, anemia ,   Anesthesia Other Findings Multiple caps  Reproductive/Obstetrics                         Anesthesia Physical Anesthesia Plan  ASA: III  Anesthesia Plan: MAC   Post-op Pain Management:    Induction: Intravenous  Airway Management Planned: Simple Face Mask  Additional Equipment:   Intra-op Plan:   Post-operative Plan:   Informed Consent: I have reviewed the patients History and Physical, chart, labs and discussed the procedure including the risks, benefits and alternatives for the proposed anesthesia with the patient or authorized representative who has indicated his/her understanding and acceptance.   Dental advisory given  Plan Discussed with: CRNA  Anesthesia Plan Comments:         Anesthesia Quick Evaluation

## 2012-10-30 ENCOUNTER — Encounter (HOSPITAL_COMMUNITY): Payer: Self-pay | Admitting: Anesthesiology

## 2012-10-30 ENCOUNTER — Encounter (HOSPITAL_COMMUNITY)
Admission: RE | Disposition: A | Discharge status: Home / Self Care | Payer: Self-pay | Source: Ambulatory Visit | Attending: Gastroenterology

## 2012-10-30 ENCOUNTER — Ambulatory Visit (HOSPITAL_COMMUNITY)
Admission: RE | Admit: 2012-10-30 | Discharge: 2012-10-30 | Disposition: A | Discharge status: Home / Self Care | Payer: Medicare Other | Source: Ambulatory Visit | Attending: Gastroenterology | Admitting: Gastroenterology

## 2012-10-30 ENCOUNTER — Ambulatory Visit (HOSPITAL_COMMUNITY): Payer: Medicare Other | Admitting: Anesthesiology

## 2012-10-30 ENCOUNTER — Encounter (HOSPITAL_COMMUNITY): Payer: Self-pay | Admitting: *Deleted

## 2012-10-30 DIAGNOSIS — Z7982 Long term (current) use of aspirin: Secondary | ICD-10-CM | POA: Diagnosis not present

## 2012-10-30 DIAGNOSIS — D509 Iron deficiency anemia, unspecified: Secondary | ICD-10-CM | POA: Diagnosis not present

## 2012-10-30 DIAGNOSIS — K219 Gastro-esophageal reflux disease without esophagitis: Secondary | ICD-10-CM | POA: Insufficient documentation

## 2012-10-30 DIAGNOSIS — E119 Type 2 diabetes mellitus without complications: Secondary | ICD-10-CM | POA: Diagnosis not present

## 2012-10-30 DIAGNOSIS — Z7902 Long term (current) use of antithrombotics/antiplatelets: Secondary | ICD-10-CM | POA: Diagnosis not present

## 2012-10-30 DIAGNOSIS — I10 Essential (primary) hypertension: Secondary | ICD-10-CM | POA: Insufficient documentation

## 2012-10-30 HISTORY — DX: Essential (primary) hypertension: I10

## 2012-10-30 HISTORY — DX: Gastro-esophageal reflux disease without esophagitis: K21.9

## 2012-10-30 HISTORY — DX: Type 2 diabetes mellitus without complications: E11.9

## 2012-10-30 HISTORY — PX: ESOPHAGOGASTRODUODENOSCOPY (EGD) WITH PROPOFOL: SHX5813

## 2012-10-30 HISTORY — DX: Anemia, unspecified: D64.9

## 2012-10-30 HISTORY — DX: Unspecified osteoarthritis, unspecified site: M19.90

## 2012-10-30 HISTORY — DX: Atherosclerotic heart disease of native coronary artery without angina pectoris: I25.10

## 2012-10-30 HISTORY — DX: Acute myocardial infarction, unspecified: I21.9

## 2012-10-30 HISTORY — PX: COLONOSCOPY WITH PROPOFOL: SHX5780

## 2012-10-30 LAB — GLUCOSE, CAPILLARY: Glucose-Capillary: 146 mg/dL — ABNORMAL HIGH (ref 70–99)

## 2012-10-30 SURGERY — COLONOSCOPY WITH PROPOFOL
Anesthesia: Monitor Anesthesia Care

## 2012-10-30 MED ORDER — PROPOFOL 10 MG/ML IV BOLUS
INTRAVENOUS | Status: DC | PRN
  Administered 2012-10-30 (×4): 20 mg via INTRAVENOUS

## 2012-10-30 MED ORDER — SODIUM CHLORIDE 0.9 % IV SOLN: INTRAVENOUS | Status: DC

## 2012-10-30 MED ORDER — KETAMINE HCL 50 MG/ML IJ SOLN
INTRAMUSCULAR | Status: DC | PRN
  Administered 2012-10-30 (×2): 10 mg via INTRAMUSCULAR

## 2012-10-30 MED ORDER — LACTATED RINGERS IV SOLN
INTRAVENOUS | Status: DC
  Administered 2012-10-30: 1000 mL via INTRAVENOUS

## 2012-10-30 MED ORDER — LABETALOL HCL 5 MG/ML IV SOLN
INTRAVENOUS | Status: DC | PRN
  Administered 2012-10-30: 5 mg via INTRAVENOUS

## 2012-10-30 MED ORDER — PROMETHAZINE HCL 25 MG/ML IJ SOLN: 6.2500 mg | INTRAMUSCULAR | Status: DC | PRN

## 2012-10-30 MED ORDER — BUTAMBEN-TETRACAINE-BENZOCAINE 2-2-14 % EX AERO
INHALATION_SPRAY | CUTANEOUS | Status: DC | PRN
  Administered 2012-10-30: 2 via TOPICAL

## 2012-10-30 MED ORDER — PROPOFOL INFUSION 10 MG/ML OPTIME
INTRAVENOUS | Status: DC | PRN
  Administered 2012-10-30: 75 ug/kg/min via INTRAVENOUS

## 2012-10-30 SURGICAL SUPPLY — 22 items

## 2012-10-30 NOTE — Op Note (Signed)
Problem: Iron deficiency associated with a normal hemoglobin while taking aspirin, Plavix, and Aleve.  Procedure: Diagnostic esophagogastroduodenoscopy  Endoscopist: Danise Edge  Premedication: Propofol administered by anesthesia  Procedure: The patient was placed in the left lateral decubitus position. The Pentax gastroscope was passed through the posterior hypopharynx into the proximal esophagus without difficulty. The hypopharynx, larynx, and vocal cords appeared normal.  Esophagoscopy: The proximal, mid, and lower segments of the esophageal mucosa appear normal. The squamocolumnar junction is noted at 40 cm from the incisor teeth. There is no endoscopic evidence for the presence of Barrett's esophagus.  Gastroscopy: Retroflex view of the gastric cardia and fundus was normal. The gastric body, antrum, and pylorus appeared normal.  Duodenoscopy: The duodenal bulb and descending duodenum appeared normal.  Assessment: Normal esophagogastroduodenoscopy.  Procedure: Diagnostic colonoscopy.  Procedure: Anal inspection was normal. The Pentax pediatric colonoscope was introduced into the rectum and easily advanced to the cecum. A normal-appearing ileocecal valve and appendiceal orifice were identified. Colonic preparation for the exam today was good.  Rectum. Normal. Retroflex view of the distal rectum normal.  Sigmoid colon and descending colon. Normal.  Splenic flexure. Normal.  Transverse colon. Normal.  Hepatic flexure. Normal.  Ascending colon. Normal.  Cecum and ileocecal valve. Normal.  Assessment: Normal proctocolonoscopy to the cecum.  Recommendations: Continue aspirin 81 mg daily to prevent coronary artery stent occlusion. Stop Plavix. Start oral iron sulfate 325 mg twice daily for 50 days (100 tablets). Recheck serum ferritin and serum iron saturation in 6 weeks.

## 2012-10-30 NOTE — Anesthesia Postprocedure Evaluation (Signed)
Anesthesia Post Note  Patient: Sophia Ward  Procedure(s) Performed: Procedure(s) (LRB): COLONOSCOPY WITH PROPOFOL (N/A) ESOPHAGOGASTRODUODENOSCOPY (EGD) WITH PROPOFOL (N/A)  Anesthesia type: MAC  Patient location: PACU  Post pain: Pain level controlled  Post assessment: Post-op Vital signs reviewed  Last Vitals:  Filed Vitals:   10/30/12 0843  BP: 164/62  Temp:   Resp: 16    Post vital signs: Reviewed  Level of consciousness: sedated  Complications: No apparent anesthesia complications

## 2012-10-30 NOTE — Transfer of Care (Signed)
Immediate Anesthesia Transfer of Care Note  Patient: Sophia Ward  Procedure(s) Performed: Procedure(s) (LRB): COLONOSCOPY WITH PROPOFOL (N/A) ESOPHAGOGASTRODUODENOSCOPY (EGD) WITH PROPOFOL (N/A)  Patient Location: PACU  Anesthesia Type: MAC  Level of Consciousness: sedated, patient cooperative and responds to stimulaton  Airway & Oxygen Therapy: Patient Spontanous Breathing and Patient connected to face mask oxgen  Post-op Assessment: Report given to PACU RN and Post -op Vital signs reviewed and stable  Post vital signs: Reviewed and stable  Complications: No apparent anesthesia complications

## 2012-10-30 NOTE — H&P (Signed)
  Problem: Iron deficiency anemia on aspirin, Plavix, and Aleve. Low serum ferritin and low serum iron saturation. Hemoglobin 12 g.  History: The patient is a 79 year old female born [birth year]. The patient underwent a normal esophagogastroduodenoscopy and colonoscopy on 09/27/2004.  The patient chronically takes aspirin, Plavix, and Aleve along with her pantoprazole. She tells me she was diagnosed with a duodenal ulcer years ago.  The patient was recently diagnosed with iron deficiency anemia associated with a low serum ferritin, low serum iron saturation, and hemoglobin 12 g.  The patient intermittently has discomfort in her lower abdomen but denies gastrointestinal bleeding.  The patient is scheduled to undergo a diagnostic esophagogastroduodenoscopy and colonoscopy to evaluate iron deficiency anemia.  Chronic medications: Nitroglycerin. Fluticasone. Glimepiride. Amlodipine. Plavix. Hydrochlorothiazide. Ultrase. Atorvastatin. Pantoprazole. WelChol. Premarin. Metoprolol. Metformin. Fish oil. Vitamin D. Calcium. Aleve. Aspirin.  Past medical and surgical history: Gastroesophageal reflux. Allergic rhinitis. Osteopenia. Hypertension. Hypercholesterolemia. Type 2 diabetes mellitus. Carpal tunnel release surgery. Tonsillectomy. Right shoulder surgery. Bilateral cataract surgery.  Allergies: Penicillin and codeine.  Exam: The patient is alert and lying comfortably on the endoscopy stretcher. Abdomen is soft, flat, and nontender to palpation in all quadrants. Cardiac exam reveals a regular rhythm. Lungs are clear to auscultation.  Plan: Proceed with diagnostic esophagogastroduodenoscopy and colonoscopy to evaluate iron deficiency anemia.

## 2012-10-31 ENCOUNTER — Encounter (HOSPITAL_COMMUNITY): Payer: Self-pay | Admitting: Gastroenterology

## 2012-11-15 DIAGNOSIS — E119 Type 2 diabetes mellitus without complications: Secondary | ICD-10-CM | POA: Diagnosis not present

## 2012-12-05 DIAGNOSIS — R5381 Other malaise: Secondary | ICD-10-CM | POA: Diagnosis not present

## 2012-12-05 DIAGNOSIS — M25569 Pain in unspecified knee: Secondary | ICD-10-CM | POA: Diagnosis not present

## 2012-12-05 DIAGNOSIS — M2550 Pain in unspecified joint: Secondary | ICD-10-CM | POA: Diagnosis not present

## 2012-12-05 DIAGNOSIS — M1990 Unspecified osteoarthritis, unspecified site: Secondary | ICD-10-CM | POA: Diagnosis not present

## 2012-12-05 DIAGNOSIS — R5383 Other fatigue: Secondary | ICD-10-CM | POA: Diagnosis not present

## 2012-12-05 DIAGNOSIS — D649 Anemia, unspecified: Secondary | ICD-10-CM | POA: Diagnosis not present

## 2012-12-05 DIAGNOSIS — M19049 Primary osteoarthritis, unspecified hand: Secondary | ICD-10-CM | POA: Diagnosis not present

## 2012-12-17 DIAGNOSIS — I10 Essential (primary) hypertension: Secondary | ICD-10-CM | POA: Diagnosis not present

## 2012-12-17 DIAGNOSIS — I2510 Atherosclerotic heart disease of native coronary artery without angina pectoris: Secondary | ICD-10-CM | POA: Diagnosis not present

## 2012-12-17 DIAGNOSIS — E782 Mixed hyperlipidemia: Secondary | ICD-10-CM | POA: Diagnosis not present

## 2012-12-26 ENCOUNTER — Other Ambulatory Visit: Payer: Self-pay

## 2012-12-26 DIAGNOSIS — Z1231 Encounter for screening mammogram for malignant neoplasm of breast: Secondary | ICD-10-CM

## 2013-01-23 ENCOUNTER — Ambulatory Visit
Admission: RE | Admit: 2013-01-23 | Discharge: 2013-01-23 | Disposition: A | Discharge status: Home / Self Care | Payer: Medicare Other | Source: Ambulatory Visit

## 2013-01-23 DIAGNOSIS — Z1231 Encounter for screening mammogram for malignant neoplasm of breast: Secondary | ICD-10-CM | POA: Diagnosis not present

## 2013-01-29 DIAGNOSIS — R7989 Other specified abnormal findings of blood chemistry: Secondary | ICD-10-CM | POA: Diagnosis not present

## 2013-01-29 DIAGNOSIS — M2550 Pain in unspecified joint: Secondary | ICD-10-CM | POA: Diagnosis not present

## 2013-01-29 DIAGNOSIS — M1990 Unspecified osteoarthritis, unspecified site: Secondary | ICD-10-CM | POA: Diagnosis not present

## 2013-02-24 DIAGNOSIS — Z23 Encounter for immunization: Secondary | ICD-10-CM | POA: Diagnosis not present

## 2013-05-06 DIAGNOSIS — I252 Old myocardial infarction: Secondary | ICD-10-CM | POA: Diagnosis not present

## 2013-05-06 DIAGNOSIS — D509 Iron deficiency anemia, unspecified: Secondary | ICD-10-CM | POA: Diagnosis not present

## 2013-05-06 DIAGNOSIS — M1990 Unspecified osteoarthritis, unspecified site: Secondary | ICD-10-CM | POA: Diagnosis not present

## 2013-05-06 DIAGNOSIS — E119 Type 2 diabetes mellitus without complications: Secondary | ICD-10-CM | POA: Diagnosis not present

## 2013-05-06 DIAGNOSIS — I2510 Atherosclerotic heart disease of native coronary artery without angina pectoris: Secondary | ICD-10-CM | POA: Diagnosis not present

## 2013-05-06 DIAGNOSIS — R7989 Other specified abnormal findings of blood chemistry: Secondary | ICD-10-CM | POA: Diagnosis not present

## 2013-05-06 DIAGNOSIS — B009 Herpesviral infection, unspecified: Secondary | ICD-10-CM | POA: Diagnosis not present

## 2013-05-06 DIAGNOSIS — M2550 Pain in unspecified joint: Secondary | ICD-10-CM | POA: Diagnosis not present

## 2013-06-14 DIAGNOSIS — M79609 Pain in unspecified limb: Secondary | ICD-10-CM | POA: Diagnosis not present

## 2013-06-14 DIAGNOSIS — M7750 Other enthesopathy of unspecified foot: Secondary | ICD-10-CM | POA: Diagnosis not present

## 2013-06-14 DIAGNOSIS — E785 Hyperlipidemia, unspecified: Secondary | ICD-10-CM | POA: Diagnosis not present

## 2013-06-14 DIAGNOSIS — M109 Gout, unspecified: Secondary | ICD-10-CM | POA: Diagnosis not present

## 2013-06-14 DIAGNOSIS — M8708 Idiopathic aseptic necrosis of bone, other site: Secondary | ICD-10-CM | POA: Diagnosis not present

## 2013-10-25 ENCOUNTER — Other Ambulatory Visit: Payer: Self-pay | Admitting: Dermatology

## 2013-10-25 DIAGNOSIS — D239 Other benign neoplasm of skin, unspecified: Secondary | ICD-10-CM | POA: Diagnosis not present

## 2013-10-25 DIAGNOSIS — D1801 Hemangioma of skin and subcutaneous tissue: Secondary | ICD-10-CM | POA: Diagnosis not present

## 2013-10-25 DIAGNOSIS — D0470 Carcinoma in situ of skin of unspecified lower limb, including hip: Secondary | ICD-10-CM | POA: Diagnosis not present

## 2013-10-25 DIAGNOSIS — L821 Other seborrheic keratosis: Secondary | ICD-10-CM | POA: Diagnosis not present

## 2013-10-25 DIAGNOSIS — L819 Disorder of pigmentation, unspecified: Secondary | ICD-10-CM | POA: Diagnosis not present

## 2013-10-25 DIAGNOSIS — L570 Actinic keratosis: Secondary | ICD-10-CM | POA: Diagnosis not present

## 2013-10-25 DIAGNOSIS — D485 Neoplasm of uncertain behavior of skin: Secondary | ICD-10-CM | POA: Diagnosis not present

## 2013-10-31 DIAGNOSIS — H26499 Other secondary cataract, unspecified eye: Secondary | ICD-10-CM | POA: Diagnosis not present

## 2013-10-31 DIAGNOSIS — E119 Type 2 diabetes mellitus without complications: Secondary | ICD-10-CM | POA: Diagnosis not present

## 2013-11-05 DIAGNOSIS — E119 Type 2 diabetes mellitus without complications: Secondary | ICD-10-CM | POA: Diagnosis not present

## 2013-11-05 DIAGNOSIS — I252 Old myocardial infarction: Secondary | ICD-10-CM | POA: Diagnosis not present

## 2013-11-05 DIAGNOSIS — I10 Essential (primary) hypertension: Secondary | ICD-10-CM | POA: Diagnosis not present

## 2013-11-05 DIAGNOSIS — M109 Gout, unspecified: Secondary | ICD-10-CM | POA: Diagnosis not present

## 2013-11-21 ENCOUNTER — Encounter: Payer: Self-pay | Admitting: Interventional Cardiology

## 2013-12-17 ENCOUNTER — Ambulatory Visit: Payer: Medicare Other | Admitting: Interventional Cardiology

## 2014-01-06 ENCOUNTER — Other Ambulatory Visit: Payer: Self-pay

## 2014-01-06 DIAGNOSIS — Z1231 Encounter for screening mammogram for malignant neoplasm of breast: Secondary | ICD-10-CM

## 2014-01-24 ENCOUNTER — Encounter (INDEPENDENT_AMBULATORY_CARE_PROVIDER_SITE_OTHER): Payer: Self-pay

## 2014-01-24 ENCOUNTER — Ambulatory Visit
Admission: RE | Admit: 2014-01-24 | Discharge: 2014-01-24 | Disposition: A | Discharge status: Home / Self Care | Payer: Medicare Other | Source: Ambulatory Visit

## 2014-01-24 DIAGNOSIS — Z1231 Encounter for screening mammogram for malignant neoplasm of breast: Secondary | ICD-10-CM | POA: Diagnosis not present

## 2014-01-30 ENCOUNTER — Encounter: Payer: Self-pay | Admitting: Cardiology

## 2014-01-30 ENCOUNTER — Encounter: Payer: Self-pay | Admitting: Interventional Cardiology

## 2014-01-30 ENCOUNTER — Ambulatory Visit (INDEPENDENT_AMBULATORY_CARE_PROVIDER_SITE_OTHER): Payer: Medicare Other | Admitting: Interventional Cardiology

## 2014-01-30 VITALS — BP 140/62 | HR 87 | Ht 62.0 in | Wt 179.8 lb

## 2014-01-30 DIAGNOSIS — I2510 Atherosclerotic heart disease of native coronary artery without angina pectoris: Secondary | ICD-10-CM

## 2014-01-30 DIAGNOSIS — E782 Mixed hyperlipidemia: Secondary | ICD-10-CM | POA: Diagnosis not present

## 2014-01-30 DIAGNOSIS — I10 Essential (primary) hypertension: Secondary | ICD-10-CM

## 2014-01-30 DIAGNOSIS — E669 Obesity, unspecified: Secondary | ICD-10-CM | POA: Insufficient documentation

## 2014-01-30 HISTORY — DX: Mixed hyperlipidemia: E78.2

## 2014-01-30 HISTORY — DX: Essential (primary) hypertension: I10

## 2014-01-30 NOTE — Patient Instructions (Signed)
Your physician recommends that you continue on your current medications as directed. Please refer to the Current Medication list given to you today.  Your physician wants you to follow-up in: 1 year with Dr. Varanasi. You will receive a reminder letter in the mail two months in advance. If you don't receive a letter, please call our office to schedule the follow-up appointment.  

## 2014-01-30 NOTE — Progress Notes (Signed)
Patient ID: Sophia Ward, female   DOB: 04/09/1933, 80 y.o.   MRN: [id number]    [place], [address] [place], [place]  [address] Phone: [phone] Fax:  [phone]  Date:  01/30/2014   ID:  Sophia Ward, DOB 10/07/1932, MRN [id number]  PCP:  Horton Finer, MD      History of Present Illness: Sophia Ward is a 80 y.o. female w/ h/o CAD. She had an LAD stent placed in 2004 and has done well since then. She has been exercising, 25 minutes per session 3 days a week. She had been diagnosed with spinal stenosis and has had some injections with some pain relief. None recently. She has not needed any injections. She has seen Dr. Rhona Raider and will follow up with him. Her PLavix was stopped recently due to anemia. CAD/ASCVD:  c/o Leg edema more at the end of the day , feet.  Denies : Chest pain.  Diaphoresis.  Dizziness.  Dyspnea on exertion.  Nitroglycerin.  Orthopnea.  Palpitations.  Paroxysmal nocturnal dyspnea.  Syncope.  prior to stents, she had exertional chest pain and SOB. She has not had any of this recently.    Wt Readings from Last 3 Encounters:  01/30/14 179 lb 12.8 oz (81.557 kg)  10/17/12 184 lb (83.462 kg)  10/17/12 184 lb (83.462 kg)     Past Medical History  Diagnosis Date  . Coronary artery disease   . Hypertension   . Diabetes mellitus without complication   . Myocardial infarction 2004    mild MI-no damage- had stents  . Anemia   . GERD (gastroesophageal reflux disease)   . Arthritis     hands  . Allergic rhinitis   . Stenosing tenosynovitis   . Coronary atherosclerosis of native coronary artery 01/30/2014  . Mixed hyperlipidemia 01/30/2014  . Essential hypertension, benign 01/30/2014  . Post-menopausal   . Osteopenia   . BPPV (benign paroxysmal positional vertigo)     Current Outpatient Prescriptions  Medication Sig Dispense Refill  . amLODipine (NORVASC) 5 MG tablet Take 5 mg by mouth every morning.      Marland Kitchen aspirin EC  81 MG tablet Take 81 mg by mouth daily after supper.      Marland Kitchen atorvastatin (LIPITOR) 40 MG tablet Take 40 mg by mouth at bedtime.      . Calcium Carbonate-Vitamin D (CALCIUM + D PO) Take 1 tablet by mouth every morning.      . Cholecalciferol (VITAMIN D3) 1000 UNITS CAPS Take 1,000 Units by mouth every morning.      . colesevelam (WELCHOL) 625 MG tablet Take 625 mg by mouth every morning.      . estrogens, conjugated, (PREMARIN) 0.625 MG tablet Take 0.625 mg by mouth every morning.      Marland Kitchen glimepiride (AMARYL) 4 MG tablet Take 4 mg by mouth every morning.      . hydrochlorothiazide (HYDRODIURIL) 25 MG tablet Take 25 mg by mouth every morning.      . metFORMIN (GLUCOPHAGE) 500 MG tablet Take 500 mg by mouth 2 (two) times daily. She takes in the morning and at lunchtime.      . metoprolol succinate (TOPROL-XL) 100 MG 24 hr tablet Take 100 mg by mouth every morning.      . naproxen sodium (ALEVE) 220 MG tablet Take 220 mg by mouth every 8 (eight) hours as needed (For arthritis, headache or pain.).      Marland Kitchen nitroGLYCERIN (NITROSTAT) 0.4 MG  SL tablet Place 0.4 mg under the tongue every 5 (five) minutes as needed for chest pain.      . Omega-3 Fatty Acids (FISH OIL PO) Take 1 capsule by mouth every morning.      . pantoprazole (PROTONIX) 40 MG tablet Take 40 mg by mouth daily after lunch.      . ramipril (ALTACE) 10 MG tablet Take 20 mg by mouth every morning.       No current facility-administered medications for this visit.    Allergies:    Allergies  Allergen Reactions  . Codeine Nausea And Vomiting  . Penicillins Rash    Social History:  The patient  reports that she has never smoked. She does not have any smokeless tobacco history on file. She reports that she does not drink alcohol or use illicit drugs.   Family History:  The patient's family history includes CAD in her father and mother; Heart attack in her mother.   ROS:  Please see the history of present illness.  No nausea, vomiting.  No  fevers, chills.  No focal weakness.  No dysuria.    All other systems reviewed and negative.   PHYSICAL EXAM: VS:  BP 140/62  Pulse 87  Ht 5\' 2"  (1.575 m)  Wt 179 lb 12.8 oz (81.557 kg)  BMI 32.88 kg/m2 Well nourished, well developed, in no acute distress HEENT: normal Neck: no JVD, no carotid bruits Cardiac:  normal S1, S2; RRR;  Lungs:  clear to auscultation bilaterally, no wheezing, rhonchi or rales Abd: soft, nontender, no hepatomegaly Ext: no edema Skin: warm and dry Neuro:   no focal abnormalities noted  EKG:  normal     ASSESSMENT AND PLAN:  Coronary atherosclerosis of native coronary artery  Continue Aspirin Tablet Delayed Release, 81 MG, 1 tablet, Orally, Once a day Refill Nitroglycerin Tablet Sublingual, 0.4 MG, 1 tablet under the tongue, Sublingual, as directed, 30 days, 25, Refills 6 IMAGING: EKG    Harward,Amy 12/17/2012 09:45:18 AM > Sophia Ward,JAY 12/17/2012 09:59:48 AM > Normal   Notes: No angina like prior CAD.  Restenosis of LAD stent. Restented with DES.    2. Mixed hyperlipidemia  Continue Fish Oil Capsule, 1000 MG, 2 capsules, Orally, Once a day Continue Atorvastatin Calcium Tablet, 40 MG, 1 tablet, Orally, Once a day Notes: Lipids well controlled in 5/14. LDL 72.  Lipids to be checked by PMD.     3. Essential hypertension, benign  Continue Altace Tablet, 10 MG, 1 capsule, Orally, Twice a day Continue HCTZ 25 MG Tablet, 25 MG, 1 tablet, Orally, Once a day Increase Amlodipine Besylate Tablet, 10 MG, 1 tablet, Orally, Once a day, 30, Refills 11 Notes: Check BP at home. Readings in the 355 range systolic. BP target < 130/80 given diabetes.    4. Obesity: She has lost a little weight.  She should continue to try losing weight.   Preventive Medicine  Adult topics discussed:  Diet: healthy diet.  Exercise: 5 days a week, at least 30 minutes of aerobic exercise.      Signed, Mina Marble, MD, [facility] 01/30/2014 2:35 PM

## 2014-03-06 DIAGNOSIS — M4806 Spinal stenosis, lumbar region: Secondary | ICD-10-CM | POA: Diagnosis not present

## 2014-03-06 DIAGNOSIS — M545 Low back pain: Secondary | ICD-10-CM | POA: Diagnosis not present

## 2014-03-09 DIAGNOSIS — Z23 Encounter for immunization: Secondary | ICD-10-CM | POA: Diagnosis not present

## 2014-03-18 DIAGNOSIS — J019 Acute sinusitis, unspecified: Secondary | ICD-10-CM | POA: Diagnosis not present

## 2014-03-21 ENCOUNTER — Encounter: Payer: Self-pay | Admitting: *Deleted

## 2014-03-26 DIAGNOSIS — M4806 Spinal stenosis, lumbar region: Secondary | ICD-10-CM | POA: Diagnosis not present

## 2014-03-27 DIAGNOSIS — M5126 Other intervertebral disc displacement, lumbar region: Secondary | ICD-10-CM | POA: Diagnosis not present

## 2014-03-27 DIAGNOSIS — M4806 Spinal stenosis, lumbar region: Secondary | ICD-10-CM | POA: Diagnosis not present

## 2014-03-27 DIAGNOSIS — M47816 Spondylosis without myelopathy or radiculopathy, lumbar region: Secondary | ICD-10-CM | POA: Diagnosis not present

## 2014-03-31 DIAGNOSIS — M545 Low back pain: Secondary | ICD-10-CM | POA: Diagnosis not present

## 2014-03-31 DIAGNOSIS — Z79899 Other long term (current) drug therapy: Secondary | ICD-10-CM | POA: Diagnosis not present

## 2014-03-31 DIAGNOSIS — M5136 Other intervertebral disc degeneration, lumbar region: Secondary | ICD-10-CM | POA: Diagnosis not present

## 2014-03-31 DIAGNOSIS — M5126 Other intervertebral disc displacement, lumbar region: Secondary | ICD-10-CM | POA: Diagnosis not present

## 2014-03-31 DIAGNOSIS — M5116 Intervertebral disc disorders with radiculopathy, lumbar region: Secondary | ICD-10-CM | POA: Diagnosis not present

## 2014-04-29 ENCOUNTER — Other Ambulatory Visit: Payer: Self-pay | Admitting: Dermatology

## 2014-04-29 DIAGNOSIS — L218 Other seborrheic dermatitis: Secondary | ICD-10-CM | POA: Diagnosis not present

## 2014-04-29 DIAGNOSIS — Z85828 Personal history of other malignant neoplasm of skin: Secondary | ICD-10-CM | POA: Diagnosis not present

## 2014-04-29 DIAGNOSIS — L570 Actinic keratosis: Secondary | ICD-10-CM | POA: Diagnosis not present

## 2014-04-29 DIAGNOSIS — D485 Neoplasm of uncertain behavior of skin: Secondary | ICD-10-CM | POA: Diagnosis not present

## 2014-04-29 DIAGNOSIS — L720 Epidermal cyst: Secondary | ICD-10-CM | POA: Diagnosis not present

## 2014-04-29 DIAGNOSIS — D0462 Carcinoma in situ of skin of left upper limb, including shoulder: Secondary | ICD-10-CM | POA: Diagnosis not present

## 2014-05-14 DIAGNOSIS — E782 Mixed hyperlipidemia: Secondary | ICD-10-CM | POA: Diagnosis not present

## 2014-05-14 DIAGNOSIS — E119 Type 2 diabetes mellitus without complications: Secondary | ICD-10-CM | POA: Diagnosis not present

## 2014-05-14 DIAGNOSIS — Z8709 Personal history of other diseases of the respiratory system: Secondary | ICD-10-CM | POA: Diagnosis not present

## 2014-05-14 DIAGNOSIS — K219 Gastro-esophageal reflux disease without esophagitis: Secondary | ICD-10-CM | POA: Diagnosis not present

## 2014-05-14 DIAGNOSIS — I25119 Atherosclerotic heart disease of native coronary artery with unspecified angina pectoris: Secondary | ICD-10-CM | POA: Diagnosis not present

## 2014-05-14 DIAGNOSIS — I10 Essential (primary) hypertension: Secondary | ICD-10-CM | POA: Diagnosis not present

## 2014-05-14 DIAGNOSIS — Z23 Encounter for immunization: Secondary | ICD-10-CM | POA: Diagnosis not present

## 2014-05-14 DIAGNOSIS — Z0000 Encounter for general adult medical examination without abnormal findings: Secondary | ICD-10-CM | POA: Diagnosis not present

## 2014-06-06 DIAGNOSIS — M4806 Spinal stenosis, lumbar region: Secondary | ICD-10-CM | POA: Diagnosis not present

## 2014-06-06 DIAGNOSIS — M4726 Other spondylosis with radiculopathy, lumbar region: Secondary | ICD-10-CM | POA: Diagnosis not present

## 2014-06-06 DIAGNOSIS — Z7901 Long term (current) use of anticoagulants: Secondary | ICD-10-CM | POA: Diagnosis not present

## 2014-06-06 DIAGNOSIS — M5406 Panniculitis affecting regions of neck and back, lumbar region: Secondary | ICD-10-CM | POA: Diagnosis not present

## 2014-06-06 DIAGNOSIS — M5136 Other intervertebral disc degeneration, lumbar region: Secondary | ICD-10-CM | POA: Diagnosis not present

## 2014-06-06 DIAGNOSIS — M5126 Other intervertebral disc displacement, lumbar region: Secondary | ICD-10-CM | POA: Diagnosis not present

## 2014-06-06 DIAGNOSIS — M5116 Intervertebral disc disorders with radiculopathy, lumbar region: Secondary | ICD-10-CM | POA: Diagnosis not present

## 2014-08-21 DIAGNOSIS — E785 Hyperlipidemia, unspecified: Secondary | ICD-10-CM | POA: Diagnosis not present

## 2014-08-21 DIAGNOSIS — I10 Essential (primary) hypertension: Secondary | ICD-10-CM | POA: Diagnosis not present

## 2014-09-10 DIAGNOSIS — M5126 Other intervertebral disc displacement, lumbar region: Secondary | ICD-10-CM | POA: Diagnosis not present

## 2014-09-10 DIAGNOSIS — Z7901 Long term (current) use of anticoagulants: Secondary | ICD-10-CM | POA: Diagnosis not present

## 2014-09-10 DIAGNOSIS — M5136 Other intervertebral disc degeneration, lumbar region: Secondary | ICD-10-CM | POA: Diagnosis not present

## 2014-09-10 DIAGNOSIS — M4806 Spinal stenosis, lumbar region: Secondary | ICD-10-CM | POA: Diagnosis not present

## 2014-09-10 DIAGNOSIS — M5116 Intervertebral disc disorders with radiculopathy, lumbar region: Secondary | ICD-10-CM | POA: Diagnosis not present

## 2014-09-10 DIAGNOSIS — M5406 Panniculitis affecting regions of neck and back, lumbar region: Secondary | ICD-10-CM | POA: Diagnosis not present

## 2014-09-10 DIAGNOSIS — M4726 Other spondylosis with radiculopathy, lumbar region: Secondary | ICD-10-CM | POA: Diagnosis not present

## 2014-11-04 DIAGNOSIS — K130 Diseases of lips: Secondary | ICD-10-CM | POA: Diagnosis not present

## 2014-11-04 DIAGNOSIS — D1801 Hemangioma of skin and subcutaneous tissue: Secondary | ICD-10-CM | POA: Diagnosis not present

## 2014-11-04 DIAGNOSIS — D485 Neoplasm of uncertain behavior of skin: Secondary | ICD-10-CM | POA: Diagnosis not present

## 2014-11-04 DIAGNOSIS — L821 Other seborrheic keratosis: Secondary | ICD-10-CM | POA: Diagnosis not present

## 2014-11-04 DIAGNOSIS — L570 Actinic keratosis: Secondary | ICD-10-CM | POA: Diagnosis not present

## 2014-11-04 DIAGNOSIS — L812 Freckles: Secondary | ICD-10-CM | POA: Diagnosis not present

## 2014-11-10 DIAGNOSIS — R300 Dysuria: Secondary | ICD-10-CM | POA: Diagnosis not present

## 2014-11-10 DIAGNOSIS — N390 Urinary tract infection, site not specified: Secondary | ICD-10-CM | POA: Diagnosis not present

## 2014-11-10 DIAGNOSIS — I10 Essential (primary) hypertension: Secondary | ICD-10-CM | POA: Diagnosis not present

## 2014-11-10 DIAGNOSIS — E119 Type 2 diabetes mellitus without complications: Secondary | ICD-10-CM | POA: Diagnosis not present

## 2014-11-10 DIAGNOSIS — M5416 Radiculopathy, lumbar region: Secondary | ICD-10-CM | POA: Diagnosis not present

## 2014-11-13 DIAGNOSIS — H524 Presbyopia: Secondary | ICD-10-CM | POA: Diagnosis not present

## 2014-11-13 DIAGNOSIS — E119 Type 2 diabetes mellitus without complications: Secondary | ICD-10-CM | POA: Diagnosis not present

## 2014-11-13 DIAGNOSIS — H26493 Other secondary cataract, bilateral: Secondary | ICD-10-CM | POA: Diagnosis not present

## 2014-11-24 DIAGNOSIS — M4807 Spinal stenosis, lumbosacral region: Secondary | ICD-10-CM | POA: Diagnosis not present

## 2014-11-24 DIAGNOSIS — M4806 Spinal stenosis, lumbar region: Secondary | ICD-10-CM | POA: Diagnosis not present

## 2014-12-02 ENCOUNTER — Telehealth: Payer: Self-pay | Admitting: Interventional Cardiology

## 2014-12-02 NOTE — Telephone Encounter (Signed)
Pt calling being off Plavix-has questions re back injections and being off the med

## 2014-12-02 NOTE — Telephone Encounter (Signed)
Returned call to patient.  She currently takes Plavix 75 mg a day.  She is having trouble with her back again and is going to receive a series of epidural injections.  Her consultation is 7/15 with injection scheduled for 7/26th.  Andreia would like to know how many days she needs to not take plavix before each injection.  Does she need to stay off it any length of time after epidural injection.  Please advise.

## 2014-12-05 NOTE — Telephone Encounter (Signed)
Spoke with pt and informed her that Dr. Irish Lack said ok to hold plavix 5 days prior to injection and discuss bleeding risk with MD doing injection to decide on when to restart Plavix. Pt states that Dr. Mina Marble is doing the injections and that he had spoke about possibly doing a series of injections (1 each week for 3 weeks). Pt states that she has an appt with Dr. Mina Marble on 7/15 to discuss and finalize what they are actually going to do. Informed the pt to have Dr. Leland Her office contact us once plans are finalized with what they plan to do exactly so that we can properly clear her. Pt verbalized understanding and was in agreement with this plan.

## 2014-12-05 NOTE — Telephone Encounter (Signed)
She should stay off Plavix 5 days prior to the injections.  Would have to discuss bleeding risk with MD doing injections to decide when to restart after injections.

## 2014-12-12 DIAGNOSIS — M4806 Spinal stenosis, lumbar region: Secondary | ICD-10-CM | POA: Diagnosis not present

## 2014-12-23 DIAGNOSIS — M4806 Spinal stenosis, lumbar region: Secondary | ICD-10-CM | POA: Diagnosis not present

## 2015-01-30 ENCOUNTER — Ambulatory Visit (INDEPENDENT_AMBULATORY_CARE_PROVIDER_SITE_OTHER): Payer: Medicare Other | Admitting: Interventional Cardiology

## 2015-01-30 ENCOUNTER — Encounter: Payer: Self-pay | Admitting: Interventional Cardiology

## 2015-01-30 VITALS — BP 130/80 | HR 79 | Ht 62.0 in | Wt 184.4 lb

## 2015-01-30 DIAGNOSIS — I2510 Atherosclerotic heart disease of native coronary artery without angina pectoris: Secondary | ICD-10-CM

## 2015-01-30 DIAGNOSIS — I10 Essential (primary) hypertension: Secondary | ICD-10-CM

## 2015-01-30 DIAGNOSIS — E782 Mixed hyperlipidemia: Secondary | ICD-10-CM

## 2015-01-30 NOTE — Patient Instructions (Signed)
Medication Instructions:  1) DISCONTINUE Aspirin 2) When you have your back injections and they request that you go off of Plavix for a short time, take Aspirin 81mg  while off of Plavix.  Labwork: None  Testing/Procedures: None  Follow-Up: Your physician wants you to follow-up in: 1 year with Dr. Irish Lack. You will receive a reminder letter in the mail two months in advance. If you don't receive a letter, please call our office to schedule the follow-up appointment.   Any Other Special Instructions Will Be Listed Below (If Applicable).

## 2015-01-30 NOTE — Progress Notes (Signed)
Patient ID: Sophia Ward, female   DOB: August 14, 1932, 81 y.o.   MRN: [id number]     Cardiology Office Note   Date:  01/30/2015   ID:  Sophia Ward, DOB November 29, 1932, MRN [id number]  PCP:  Horton Finer, MD    No chief complaint on file.  follow-up CAD   Wt Readings from Last 3 Encounters:  01/30/15 184 lb 6.4 oz (83.643 kg)  01/30/14 179 lb 12.8 oz (81.557 kg)  10/17/12 184 lb (83.462 kg)       History of Present Illness: Sophia Ward is a 81 y.o. female   w/ h/o CAD. She had an LAD stent placed in 2004 (drug-eluting stent for restenosis) and has done well since then. She is no longer exercising, 25 minutes per session 3 days a week due to the back pain. She had been diagnosed with spinal stenosis and has had some injections with some pain relief. None recently. She has not needed any injections. She has seen Dr. Rhona Raider and will follow up with him.   Her PLavix was stopped due to anemia.  Iron dose was decreased recently.  Plavix was restarted in the past 2 years after anemia was stable.  She will be starting with Dr. Lysle Rubens since Dr. Maxwell Caul left.     Past Medical History  Diagnosis Date  . Coronary artery disease   . Hypertension   . Diabetes mellitus without complication   . Myocardial infarction 2004    mild MI-no damage- had stents  . Anemia   . GERD (gastroesophageal reflux disease)   . Arthritis     hands  . Allergic rhinitis   . Stenosing tenosynovitis   . Coronary atherosclerosis of native coronary artery 01/30/2014  . Mixed hyperlipidemia 01/30/2014  . Essential hypertension, benign 01/30/2014  . Post-menopausal   . Osteopenia   . BPPV (benign paroxysmal positional vertigo)     Past Surgical History  Procedure Laterality Date  . Cardiac catheterization  2004  . Coronary angioplasty  2004  . Tonsillectomy    . Abdominal hysterectomy    . Left shouler surgery    . Appendectomy    . Eye surgery      bilateral cataract with lens  implant  . Dilation and curettage of uterus    . Carpel tunnel    . Colonoscopy with propofol N/A 10/30/2012    Procedure: COLONOSCOPY WITH PROPOFOL;  Surgeon: Garlan Fair, MD;  Location: [facility];  Service: Endoscopy;  Laterality: N/A;  . Esophagogastroduodenoscopy (egd) with propofol N/A 10/30/2012    Procedure: ESOPHAGOGASTRODUODENOSCOPY (EGD) WITH PROPOFOL;  Surgeon: Garlan Fair, MD;  Location: [facility];  Service: Endoscopy;  Laterality: N/A;     Current Outpatient Prescriptions  Medication Sig Dispense Refill  . amLODipine (NORVASC) 5 MG tablet Take 5 mg by mouth every morning.    Marland Kitchen atorvastatin (LIPITOR) 40 MG tablet Take 40 mg by mouth at bedtime.    . Calcium Carbonate-Vitamin D (CALCIUM + D PO) Take 1 tablet by mouth every morning.    . Cholecalciferol (VITAMIN D3) 1000 UNITS CAPS Take 1,000 Units by mouth every morning.    . clopidogrel (PLAVIX) 75 MG tablet Take 75 mg by mouth daily.    . colesevelam (WELCHOL) 625 MG tablet Take 625 mg by mouth every morning.    . estrogens, conjugated, (PREMARIN) 0.625 MG tablet Take 0.625 mg by mouth every morning.    Marland Kitchen glimepiride (AMARYL) 4 MG tablet Take 4 mg by  mouth every morning.    . hydrochlorothiazide (HYDRODIURIL) 25 MG tablet Take 25 mg by mouth every morning.    . metFORMIN (GLUCOPHAGE) 500 MG tablet Take 500 mg by mouth 2 (two) times daily. She takes in the morning and at lunchtime.    . metoprolol succinate (TOPROL-XL) 100 MG 24 hr tablet Take 100 mg by mouth every morning.    . naproxen sodium (ALEVE) 220 MG tablet Take 220 mg by mouth every 8 (eight) hours as needed (For arthritis, headache or pain.).    Marland Kitchen nitroGLYCERIN (NITROSTAT) 0.4 MG SL tablet Place 0.4 mg under the tongue every 5 (five) minutes as needed for chest pain.    . Omega-3 Fatty Acids (FISH OIL PO) Take 1 capsule by mouth every morning.    . pantoprazole (PROTONIX) 40 MG tablet Take 40 mg by mouth daily after lunch.    . ramipril (ALTACE) 10 MG  tablet Take 20 mg by mouth every morning.     No current facility-administered medications for this visit.    Allergies:   Codeine and Penicillins    Social History:  The patient  reports that she has never smoked. She does not have any smokeless tobacco history on file. She reports that she does not drink alcohol or use illicit drugs.   Family History:  The patient's family history includes CAD in her father and mother; Heart attack in her mother.    ROS:  Please see the history of present illness.   Otherwise, review of systems are positive for back pain- not responding to injections.   All other systems are reviewed and negative.    PHYSICAL EXAM: VS:  BP 130/80 mmHg  Pulse 79  Ht 5\' 2"  (1.575 m)  Wt 184 lb 6.4 oz (83.643 kg)  BMI 33.72 kg/m2  SpO2 95% , BMI Body mass index is 33.72 kg/(m^2). GEN: Well nourished, well developed, in no acute distress HEENT: normal Neck: no JVD, carotid bruits, or masses Cardiac: RRR; no murmurs, rubs, or gallops,no edema  Respiratory:  clear to auscultation bilaterally, normal work of breathing GI: soft, nontender, nondistended, + BS MS: no deformity or atrophy Skin: warm and dry, no rash Neuro:  Strength and sensation are intact Psych: euthymic mood, full affect   EKG:   The ekg ordered today demonstrates    Recent Labs: No results found for requested labs within last 365 days.   Lipid Panel No results found for: CHOL, TRIG, HDL, CHOLHDL, VLDL, LDLCALC, LDLDIRECT   Other studies Reviewed: Additional studies/ records that were reviewed today with results demonstrating: Stress test in 2007.   ASSESSMENT AND PLAN:  1. CAD: Continue Plavix 75 mg daily since she is back on this medicine. Will stop aspirin 81 mg daily. When she has back injections, she has to come off of the Plavix. Aspirin is allowed at the time of the back injections. Therefore, on the days where she is not allowed to take Plavix prior to the back injection, I've  asked her to take aspirin 81 mg daily. Otherwise, she will just take clopidogrel 75 mg daily on a regular basis. Hopefully, this will minimize bleeding risk.  2. Hyperlipidemia: Continue lipid-lowering therapy. Lipids to be checked in December. There have been well controlled of late. 3. Hypertension: Continue antihypertensives therapy. Blood pressure is well controlled. Try to increase exercise as her back tolerates.   Current medicines are reviewed at length with the patient today.  The patient concerns regarding her medicines were addressed.  The following changes have been made:  As above  Labs/ tests ordered today include:   Orders Placed This Encounter  Procedures  . EKG 12-Lead    Recommend 150 minutes/week of aerobic exercise Low fat, low carb, high fiber diet recommended  Disposition:   FU in one year   Teresita Madura., MD  01/30/2015 10:18 AM    [facility] [place], [place], [place]  [address] Phone: [phone]; Fax: [phone]

## 2015-02-03 ENCOUNTER — Other Ambulatory Visit: Payer: Self-pay

## 2015-02-03 DIAGNOSIS — Z1231 Encounter for screening mammogram for malignant neoplasm of breast: Secondary | ICD-10-CM

## 2015-02-05 ENCOUNTER — Ambulatory Visit
Admission: RE | Admit: 2015-02-05 | Discharge: 2015-02-05 | Disposition: A | Discharge status: Home / Self Care | Payer: Medicare Other | Source: Ambulatory Visit

## 2015-02-05 DIAGNOSIS — Z1231 Encounter for screening mammogram for malignant neoplasm of breast: Secondary | ICD-10-CM

## 2015-03-24 DIAGNOSIS — M5116 Intervertebral disc disorders with radiculopathy, lumbar region: Secondary | ICD-10-CM | POA: Diagnosis not present

## 2015-03-24 DIAGNOSIS — M5386 Other specified dorsopathies, lumbar region: Secondary | ICD-10-CM | POA: Diagnosis not present

## 2015-03-24 DIAGNOSIS — M4806 Spinal stenosis, lumbar region: Secondary | ICD-10-CM | POA: Diagnosis not present

## 2015-03-24 DIAGNOSIS — M545 Low back pain: Secondary | ICD-10-CM | POA: Diagnosis not present

## 2015-03-24 DIAGNOSIS — M4726 Other spondylosis with radiculopathy, lumbar region: Secondary | ICD-10-CM | POA: Diagnosis not present

## 2015-03-24 DIAGNOSIS — M5137 Other intervertebral disc degeneration, lumbosacral region: Secondary | ICD-10-CM | POA: Diagnosis not present

## 2015-03-24 DIAGNOSIS — Z7901 Long term (current) use of anticoagulants: Secondary | ICD-10-CM | POA: Diagnosis not present

## 2015-03-24 DIAGNOSIS — M5127 Other intervertebral disc displacement, lumbosacral region: Secondary | ICD-10-CM | POA: Diagnosis not present

## 2015-04-09 DIAGNOSIS — Z23 Encounter for immunization: Secondary | ICD-10-CM | POA: Diagnosis not present

## 2015-04-24 DIAGNOSIS — S0093XA Contusion of unspecified part of head, initial encounter: Secondary | ICD-10-CM | POA: Diagnosis not present

## 2015-04-24 DIAGNOSIS — W1800XA Striking against unspecified object with subsequent fall, initial encounter: Secondary | ICD-10-CM | POA: Diagnosis not present

## 2015-05-04 DIAGNOSIS — K219 Gastro-esophageal reflux disease without esophagitis: Secondary | ICD-10-CM | POA: Diagnosis not present

## 2015-05-04 DIAGNOSIS — I10 Essential (primary) hypertension: Secondary | ICD-10-CM | POA: Diagnosis not present

## 2015-05-04 DIAGNOSIS — Z1389 Encounter for screening for other disorder: Secondary | ICD-10-CM | POA: Diagnosis not present

## 2015-05-04 DIAGNOSIS — I252 Old myocardial infarction: Secondary | ICD-10-CM | POA: Diagnosis not present

## 2015-05-04 DIAGNOSIS — M5416 Radiculopathy, lumbar region: Secondary | ICD-10-CM | POA: Diagnosis not present

## 2015-05-04 DIAGNOSIS — Z0000 Encounter for general adult medical examination without abnormal findings: Secondary | ICD-10-CM | POA: Diagnosis not present

## 2015-05-04 DIAGNOSIS — E119 Type 2 diabetes mellitus without complications: Secondary | ICD-10-CM | POA: Diagnosis not present

## 2015-05-04 DIAGNOSIS — E782 Mixed hyperlipidemia: Secondary | ICD-10-CM | POA: Diagnosis not present

## 2015-05-04 DIAGNOSIS — I2510 Atherosclerotic heart disease of native coronary artery without angina pectoris: Secondary | ICD-10-CM | POA: Diagnosis not present

## 2015-05-05 DIAGNOSIS — C44311 Basal cell carcinoma of skin of nose: Secondary | ICD-10-CM | POA: Diagnosis not present

## 2015-05-05 DIAGNOSIS — C44319 Basal cell carcinoma of skin of other parts of face: Secondary | ICD-10-CM | POA: Diagnosis not present

## 2015-05-05 DIAGNOSIS — L570 Actinic keratosis: Secondary | ICD-10-CM | POA: Diagnosis not present

## 2015-05-05 DIAGNOSIS — D485 Neoplasm of uncertain behavior of skin: Secondary | ICD-10-CM | POA: Diagnosis not present

## 2015-05-05 DIAGNOSIS — Z85828 Personal history of other malignant neoplasm of skin: Secondary | ICD-10-CM | POA: Diagnosis not present

## 2015-05-06 DIAGNOSIS — M8589 Other specified disorders of bone density and structure, multiple sites: Secondary | ICD-10-CM | POA: Diagnosis not present

## 2015-05-06 DIAGNOSIS — M859 Disorder of bone density and structure, unspecified: Secondary | ICD-10-CM | POA: Diagnosis not present

## 2015-05-20 DIAGNOSIS — Z85828 Personal history of other malignant neoplasm of skin: Secondary | ICD-10-CM | POA: Diagnosis not present

## 2015-05-20 DIAGNOSIS — C44319 Basal cell carcinoma of skin of other parts of face: Secondary | ICD-10-CM | POA: Diagnosis not present

## 2015-05-28 DIAGNOSIS — L814 Other melanin hyperpigmentation: Secondary | ICD-10-CM | POA: Diagnosis not present

## 2015-05-28 DIAGNOSIS — L570 Actinic keratosis: Secondary | ICD-10-CM | POA: Diagnosis not present

## 2015-05-28 DIAGNOSIS — X32XXXA Exposure to sunlight, initial encounter: Secondary | ICD-10-CM | POA: Diagnosis not present

## 2015-06-16 DIAGNOSIS — M4726 Other spondylosis with radiculopathy, lumbar region: Secondary | ICD-10-CM | POA: Diagnosis not present

## 2015-06-16 DIAGNOSIS — M5137 Other intervertebral disc degeneration, lumbosacral region: Secondary | ICD-10-CM | POA: Diagnosis not present

## 2015-06-16 DIAGNOSIS — Z7901 Long term (current) use of anticoagulants: Secondary | ICD-10-CM | POA: Diagnosis not present

## 2015-06-16 DIAGNOSIS — M5127 Other intervertebral disc displacement, lumbosacral region: Secondary | ICD-10-CM | POA: Diagnosis not present

## 2015-06-16 DIAGNOSIS — M5386 Other specified dorsopathies, lumbar region: Secondary | ICD-10-CM | POA: Diagnosis not present

## 2015-06-16 DIAGNOSIS — M5116 Intervertebral disc disorders with radiculopathy, lumbar region: Secondary | ICD-10-CM | POA: Diagnosis not present

## 2015-06-16 DIAGNOSIS — M4806 Spinal stenosis, lumbar region: Secondary | ICD-10-CM | POA: Diagnosis not present

## 2015-08-05 DIAGNOSIS — R42 Dizziness and giddiness: Secondary | ICD-10-CM | POA: Diagnosis not present

## 2015-08-20 DIAGNOSIS — M7989 Other specified soft tissue disorders: Secondary | ICD-10-CM | POA: Diagnosis not present

## 2015-08-20 DIAGNOSIS — M25562 Pain in left knee: Secondary | ICD-10-CM | POA: Diagnosis not present

## 2015-08-20 DIAGNOSIS — R2240 Localized swelling, mass and lump, unspecified lower limb: Secondary | ICD-10-CM | POA: Diagnosis not present

## 2015-08-31 DIAGNOSIS — S86111D Strain of other muscle(s) and tendon(s) of posterior muscle group at lower leg level, right leg, subsequent encounter: Secondary | ICD-10-CM | POA: Diagnosis not present

## 2015-09-23 DIAGNOSIS — M25562 Pain in left knee: Secondary | ICD-10-CM | POA: Diagnosis not present

## 2015-09-24 DIAGNOSIS — M25562 Pain in left knee: Secondary | ICD-10-CM | POA: Diagnosis not present

## 2015-09-25 DIAGNOSIS — M25562 Pain in left knee: Secondary | ICD-10-CM | POA: Diagnosis not present

## 2015-10-01 DIAGNOSIS — M25562 Pain in left knee: Secondary | ICD-10-CM | POA: Diagnosis not present

## 2015-10-06 DIAGNOSIS — M25562 Pain in left knee: Secondary | ICD-10-CM | POA: Diagnosis not present

## 2015-10-08 DIAGNOSIS — M25562 Pain in left knee: Secondary | ICD-10-CM | POA: Diagnosis not present

## 2015-10-13 DIAGNOSIS — M25562 Pain in left knee: Secondary | ICD-10-CM | POA: Diagnosis not present

## 2015-10-15 DIAGNOSIS — M25562 Pain in left knee: Secondary | ICD-10-CM | POA: Diagnosis not present

## 2015-10-16 DIAGNOSIS — M25562 Pain in left knee: Secondary | ICD-10-CM | POA: Diagnosis not present

## 2015-10-19 DIAGNOSIS — M25562 Pain in left knee: Secondary | ICD-10-CM | POA: Diagnosis not present

## 2015-10-19 DIAGNOSIS — J302 Other seasonal allergic rhinitis: Secondary | ICD-10-CM | POA: Diagnosis not present

## 2015-11-06 DIAGNOSIS — M5416 Radiculopathy, lumbar region: Secondary | ICD-10-CM | POA: Diagnosis not present

## 2015-11-06 DIAGNOSIS — E1122 Type 2 diabetes mellitus with diabetic chronic kidney disease: Secondary | ICD-10-CM | POA: Diagnosis not present

## 2015-11-06 DIAGNOSIS — E782 Mixed hyperlipidemia: Secondary | ICD-10-CM | POA: Diagnosis not present

## 2015-11-06 DIAGNOSIS — I25119 Atherosclerotic heart disease of native coronary artery with unspecified angina pectoris: Secondary | ICD-10-CM | POA: Diagnosis not present

## 2015-11-06 DIAGNOSIS — Z7984 Long term (current) use of oral hypoglycemic drugs: Secondary | ICD-10-CM | POA: Diagnosis not present

## 2015-11-06 DIAGNOSIS — K219 Gastro-esophageal reflux disease without esophagitis: Secondary | ICD-10-CM | POA: Diagnosis not present

## 2015-11-06 DIAGNOSIS — I10 Essential (primary) hypertension: Secondary | ICD-10-CM | POA: Diagnosis not present

## 2015-11-06 DIAGNOSIS — N183 Chronic kidney disease, stage 3 (moderate): Secondary | ICD-10-CM | POA: Diagnosis not present

## 2015-11-10 DIAGNOSIS — L821 Other seborrheic keratosis: Secondary | ICD-10-CM | POA: Diagnosis not present

## 2015-11-10 DIAGNOSIS — L812 Freckles: Secondary | ICD-10-CM | POA: Diagnosis not present

## 2015-11-10 DIAGNOSIS — C4442 Squamous cell carcinoma of skin of scalp and neck: Secondary | ICD-10-CM | POA: Diagnosis not present

## 2015-11-10 DIAGNOSIS — L570 Actinic keratosis: Secondary | ICD-10-CM | POA: Diagnosis not present

## 2015-11-10 DIAGNOSIS — D485 Neoplasm of uncertain behavior of skin: Secondary | ICD-10-CM | POA: Diagnosis not present

## 2015-11-10 DIAGNOSIS — Z85828 Personal history of other malignant neoplasm of skin: Secondary | ICD-10-CM | POA: Diagnosis not present

## 2015-11-19 DIAGNOSIS — H26493 Other secondary cataract, bilateral: Secondary | ICD-10-CM | POA: Diagnosis not present

## 2015-11-19 DIAGNOSIS — E119 Type 2 diabetes mellitus without complications: Secondary | ICD-10-CM | POA: Diagnosis not present

## 2015-11-19 DIAGNOSIS — H524 Presbyopia: Secondary | ICD-10-CM | POA: Diagnosis not present

## 2015-11-26 DIAGNOSIS — H26492 Other secondary cataract, left eye: Secondary | ICD-10-CM | POA: Diagnosis not present

## 2016-01-08 DIAGNOSIS — R3915 Urgency of urination: Secondary | ICD-10-CM | POA: Diagnosis not present

## 2016-02-02 ENCOUNTER — Encounter: Payer: Self-pay | Admitting: Interventional Cardiology

## 2016-02-02 ENCOUNTER — Ambulatory Visit (INDEPENDENT_AMBULATORY_CARE_PROVIDER_SITE_OTHER): Payer: Medicare Other | Admitting: Interventional Cardiology

## 2016-02-02 VITALS — BP 130/60 | HR 67 | Ht 62.0 in | Wt 175.8 lb

## 2016-02-02 DIAGNOSIS — I10 Essential (primary) hypertension: Secondary | ICD-10-CM | POA: Diagnosis not present

## 2016-02-02 DIAGNOSIS — E782 Mixed hyperlipidemia: Secondary | ICD-10-CM

## 2016-02-02 DIAGNOSIS — I2510 Atherosclerotic heart disease of native coronary artery without angina pectoris: Secondary | ICD-10-CM

## 2016-02-02 MED ORDER — NITROGLYCERIN 0.4 MG SL SUBL: 0.4000 mg | SUBLINGUAL_TABLET | SUBLINGUAL | 3 refills | Status: DC | PRN

## 2016-02-02 NOTE — Progress Notes (Signed)
Patient ID: Sophia Ward, female   DOB: 06-29-1932, 82 y.o.   MRN: [id number]     Cardiology Office Note   Date:  02/02/2016   ID:  Sophia Ward, DOB 11/12/1932, MRN [id number]  PCP:  Sophia Low, MD    No chief complaint on file.  follow-up CAD   Wt Readings from Last 3 Encounters:  02/02/16 175 lb 12.8 oz (79.7 kg)  01/30/15 184 lb 6.4 oz (83.6 kg)  01/30/14 179 lb 12.8 oz (81.6 kg)       History of Present Illness: Sophia Ward is a 82 y.o. female   w/ h/o CAD. She had an LAD stent placed in 2004 (drug-eluting stent for restenosis) and has done well since then. She is no longer exercising, 25 minutes per session 3 days a week due to the back pain.   Her PLavix was stopped due to anemia.  Iron dose was decreased recently.  Plavix was restarted in the past 2 years after anemia was stable.  She has spinal stenosis and receives injections.  She had a problem after an injection a few weeks ago.  She had PT and this helped the legs and back.    She walks a little, not like she did as noted above.    She lost weight after starting metformin, she had diarrhea.  It has been stopped.      Past Medical History:  Diagnosis Date  . Allergic rhinitis   . Anemia   . Arthritis    hands  . BPPV (benign paroxysmal positional vertigo)   . Coronary artery disease   . Coronary atherosclerosis of native coronary artery 01/30/2014  . Diabetes mellitus without complication ([place])   . Essential hypertension, benign 01/30/2014  . GERD (gastroesophageal reflux disease)   . Hypertension   . Mixed hyperlipidemia 01/30/2014  . Myocardial infarction ([place]) 2004   mild MI-no damage- had stents  . Osteopenia   . Post-menopausal   . Stenosing tenosynovitis     Past Surgical History:  Procedure Laterality Date  . ABDOMINAL HYSTERECTOMY    . APPENDECTOMY    . CARDIAC CATHETERIZATION  2004  . carpel tunnel    . COLONOSCOPY WITH PROPOFOL N/A 10/30/2012   Procedure: COLONOSCOPY WITH  PROPOFOL;  Surgeon: Garlan Fair, MD;  Location: [facility];  Service: Endoscopy;  Laterality: N/A;  . CORONARY ANGIOPLASTY  2004  . DILATION AND CURETTAGE OF UTERUS    . ESOPHAGOGASTRODUODENOSCOPY (EGD) WITH PROPOFOL N/A 10/30/2012   Procedure: ESOPHAGOGASTRODUODENOSCOPY (EGD) WITH PROPOFOL;  Surgeon: Garlan Fair, MD;  Location: [facility];  Service: Endoscopy;  Laterality: N/A;  . EYE SURGERY     bilateral cataract with lens implant  . left shouler surgery    . TONSILLECTOMY       Current Outpatient Prescriptions  Medication Sig Dispense Refill  . amLODipine (NORVASC) 5 MG tablet Take 5 mg by mouth every morning.    Marland Kitchen atorvastatin (LIPITOR) 40 MG tablet Take 40 mg by mouth at bedtime.    . Calcium Carbonate-Vitamin D (CALCIUM + D PO) Take 1 tablet by mouth every morning.    . Cholecalciferol (VITAMIN D3) 1000 UNITS CAPS Take 1,000 Units by mouth every morning.    . clopidogrel (PLAVIX) 75 MG tablet Take 75 mg by mouth daily.    . colesevelam (WELCHOL) 625 MG tablet Take 625 mg by mouth every morning.    . estrogens, conjugated, (PREMARIN) 0.625 MG tablet Take 0.625 mg by mouth every  morning.    Marland Kitchen glimepiride (AMARYL) 4 MG tablet Take 4 mg by mouth every morning.    . hydrochlorothiazide (HYDRODIURIL) 25 MG tablet Take 25 mg by mouth every morning.    . metoprolol succinate (TOPROL-XL) 100 MG 24 hr tablet Take 100 mg by mouth every morning.    . naproxen sodium (ALEVE) 220 MG tablet Take 220 mg by mouth every 8 (eight) hours as needed (For arthritis, headache or pain.).    Marland Kitchen nitroGLYCERIN (NITROSTAT) 0.4 MG SL tablet Place 0.4 mg under the tongue every 5 (five) minutes as needed for chest pain (X3 DOSES MAX).    . Omega-3 Fatty Acids (FISH OIL PO) Take 1 capsule by mouth every morning.    . pantoprazole (PROTONIX) 40 MG tablet Take 40 mg by mouth daily after lunch.    . ramipril (ALTACE) 10 MG tablet Take 20 mg by mouth every morning.    . sitaGLIPtin (JANUVIA) 50 MG tablet  Take 50 mg by mouth daily.     No current facility-administered medications for this visit.     Allergies:   Codeine and Penicillins    Social History:  The patient  reports that she has never smoked. She does not have any smokeless tobacco history on file. She reports that she does not drink alcohol or use drugs.   Family History:  The patient's family history includes CAD in her father and mother; Heart attack in her mother.    ROS:  Please see the history of present illness.   Otherwise, review of systems are positive for back pain- not responding to injections.   All other systems are reviewed and negative.    PHYSICAL EXAM: VS:  BP 130/60   Pulse 67   Ht 5\' 2"  (1.575 m)   Wt 175 lb 12.8 oz (79.7 kg)   BMI 32.15 kg/m  , BMI Body mass index is 32.15 kg/m. GEN: Well nourished, well developed, in no acute distress  HEENT: normal  Neck: no JVD, carotid bruits, or masses Cardiac: RRR; no murmurs, rubs, or gallops,no edema  Respiratory:  clear to auscultation bilaterally, normal work of breathing GI: soft, nontender, nondistended, + BS MS: no deformity or atrophy  Skin: warm and dry, no rash Neuro:  Strength and sensation are intact Psych: euthymic mood, full affect   EKG:   The ekg ordered today demonstrates    Recent Labs: No results found for requested labs within last 8760 hours.   Lipid Panel No results found for: CHOL, TRIG, HDL, CHOLHDL, VLDL, LDLCALC, LDLDIRECT   Other studies Reviewed: Additional studies/ records that were reviewed today with results demonstrating: Stress test in 2007.   ASSESSMENT AND PLAN:  1. CAD: Continue Plavix 75 mg daily since she is back on this medicine. Will stop aspirin 81 mg daily. When she has back injections, she has to come off of the Plavix. Aspirin is allowed at the time of the back injections. Therefore, on the days where she is not allowed to take Plavix prior to the back injection, I've asked her to take aspirin 81 mg  daily. Otherwise, she will just take clopidogrel 75 mg daily on a regular basis. Hopefully, this will minimize bleeding risk.  THis has worked well.  No bleeding problems.  COntinue this plan. 2. Hyperlipidemia: Continue lipid-lowering therapy. Lipids to be checked in December. There have been well controlled of late. 3. Hypertension: Continue antihypertensives therapy. Blood pressure is well controlled. Try to increase exercise as her back  tolerates.  SHe has a recumbent bike. This may be easier on her back.   Current medicines are reviewed at length with the patient today.  The patient concerns regarding her medicines were addressed.  The following changes have been made:  As above  Labs/ tests ordered today include:   No orders of the defined types were placed in this encounter.   Recommend 150 minutes/week of aerobic exercise Ward fat, Ward carb, high fiber diet recommended  Disposition:   FU in one year   Signed, Larae Grooms, MD  02/02/2016 12:08 PM    [facility] [address], [place], [place]  [address] Phone: [phone]; Fax: [phone]

## 2016-02-02 NOTE — Patient Instructions (Signed)
**Note De-identified  Obfuscation** Medication Instructions:  Same-no changes  Labwork: None  Testing/Procedures: None  Follow-Up: Your physician wants you to follow-up in: 1 year. You will receive a reminder letter in the mail two months in advance. If you don't receive a letter, please call our office to schedule the follow-up appointment.      If you need a refill on your cardiac medications before your next appointment, please call your pharmacy.   

## 2016-02-08 ENCOUNTER — Other Ambulatory Visit: Payer: Self-pay | Admitting: Internal Medicine

## 2016-02-08 DIAGNOSIS — Z1231 Encounter for screening mammogram for malignant neoplasm of breast: Secondary | ICD-10-CM

## 2016-02-12 ENCOUNTER — Ambulatory Visit: Payer: Medicare Other

## 2016-03-16 DIAGNOSIS — Z23 Encounter for immunization: Secondary | ICD-10-CM | POA: Diagnosis not present

## 2016-04-29 DIAGNOSIS — L821 Other seborrheic keratosis: Secondary | ICD-10-CM | POA: Diagnosis not present

## 2016-04-29 DIAGNOSIS — D485 Neoplasm of uncertain behavior of skin: Secondary | ICD-10-CM | POA: Diagnosis not present

## 2016-04-29 DIAGNOSIS — L570 Actinic keratosis: Secondary | ICD-10-CM | POA: Diagnosis not present

## 2016-04-29 DIAGNOSIS — L280 Lichen simplex chronicus: Secondary | ICD-10-CM | POA: Diagnosis not present

## 2016-04-29 DIAGNOSIS — Z85828 Personal history of other malignant neoplasm of skin: Secondary | ICD-10-CM | POA: Diagnosis not present

## 2016-04-29 DIAGNOSIS — C4401 Basal cell carcinoma of skin of lip: Secondary | ICD-10-CM | POA: Diagnosis not present

## 2016-04-29 DIAGNOSIS — D0462 Carcinoma in situ of skin of left upper limb, including shoulder: Secondary | ICD-10-CM | POA: Diagnosis not present

## 2016-04-29 DIAGNOSIS — C44319 Basal cell carcinoma of skin of other parts of face: Secondary | ICD-10-CM | POA: Diagnosis not present

## 2016-05-05 DIAGNOSIS — E1122 Type 2 diabetes mellitus with diabetic chronic kidney disease: Secondary | ICD-10-CM | POA: Diagnosis not present

## 2016-05-05 DIAGNOSIS — J302 Other seasonal allergic rhinitis: Secondary | ICD-10-CM | POA: Diagnosis not present

## 2016-05-05 DIAGNOSIS — N183 Chronic kidney disease, stage 3 (moderate): Secondary | ICD-10-CM | POA: Diagnosis not present

## 2016-05-05 DIAGNOSIS — M109 Gout, unspecified: Secondary | ICD-10-CM | POA: Diagnosis not present

## 2016-05-05 DIAGNOSIS — Z1389 Encounter for screening for other disorder: Secondary | ICD-10-CM | POA: Diagnosis not present

## 2016-05-05 DIAGNOSIS — I10 Essential (primary) hypertension: Secondary | ICD-10-CM | POA: Diagnosis not present

## 2016-05-05 DIAGNOSIS — I252 Old myocardial infarction: Secondary | ICD-10-CM | POA: Diagnosis not present

## 2016-05-05 DIAGNOSIS — I25119 Atherosclerotic heart disease of native coronary artery with unspecified angina pectoris: Secondary | ICD-10-CM | POA: Diagnosis not present

## 2016-05-05 DIAGNOSIS — E782 Mixed hyperlipidemia: Secondary | ICD-10-CM | POA: Diagnosis not present

## 2016-05-05 DIAGNOSIS — Z0000 Encounter for general adult medical examination without abnormal findings: Secondary | ICD-10-CM | POA: Diagnosis not present

## 2016-05-05 DIAGNOSIS — Z23 Encounter for immunization: Secondary | ICD-10-CM | POA: Diagnosis not present

## 2016-05-05 DIAGNOSIS — Z7984 Long term (current) use of oral hypoglycemic drugs: Secondary | ICD-10-CM | POA: Diagnosis not present

## 2016-05-10 DIAGNOSIS — H35373 Puckering of macula, bilateral: Secondary | ICD-10-CM | POA: Diagnosis not present

## 2016-05-10 DIAGNOSIS — E119 Type 2 diabetes mellitus without complications: Secondary | ICD-10-CM | POA: Diagnosis not present

## 2016-05-10 DIAGNOSIS — H524 Presbyopia: Secondary | ICD-10-CM | POA: Diagnosis not present

## 2016-05-16 DIAGNOSIS — C4401 Basal cell carcinoma of skin of lip: Secondary | ICD-10-CM | POA: Diagnosis not present

## 2016-05-16 DIAGNOSIS — Z85828 Personal history of other malignant neoplasm of skin: Secondary | ICD-10-CM | POA: Diagnosis not present

## 2016-05-24 DIAGNOSIS — L570 Actinic keratosis: Secondary | ICD-10-CM | POA: Diagnosis not present

## 2016-05-31 DIAGNOSIS — E1165 Type 2 diabetes mellitus with hyperglycemia: Secondary | ICD-10-CM | POA: Diagnosis not present

## 2016-05-31 DIAGNOSIS — R05 Cough: Secondary | ICD-10-CM | POA: Diagnosis not present

## 2016-05-31 DIAGNOSIS — J209 Acute bronchitis, unspecified: Secondary | ICD-10-CM | POA: Diagnosis not present

## 2016-05-31 DIAGNOSIS — M2550 Pain in unspecified joint: Secondary | ICD-10-CM | POA: Diagnosis not present

## 2016-06-11 DIAGNOSIS — E790 Hyperuricemia without signs of inflammatory arthritis and tophaceous disease: Secondary | ICD-10-CM | POA: Diagnosis not present

## 2016-06-11 DIAGNOSIS — J301 Allergic rhinitis due to pollen: Secondary | ICD-10-CM | POA: Diagnosis not present

## 2016-06-11 DIAGNOSIS — E119 Type 2 diabetes mellitus without complications: Secondary | ICD-10-CM | POA: Diagnosis not present

## 2016-06-14 DIAGNOSIS — E119 Type 2 diabetes mellitus without complications: Secondary | ICD-10-CM | POA: Diagnosis not present

## 2016-06-15 DIAGNOSIS — E559 Vitamin D deficiency, unspecified: Secondary | ICD-10-CM | POA: Diagnosis not present

## 2016-06-15 DIAGNOSIS — E785 Hyperlipidemia, unspecified: Secondary | ICD-10-CM | POA: Diagnosis not present

## 2016-06-15 DIAGNOSIS — I10 Essential (primary) hypertension: Secondary | ICD-10-CM | POA: Diagnosis not present

## 2016-06-15 DIAGNOSIS — R7309 Other abnormal glucose: Secondary | ICD-10-CM | POA: Diagnosis not present

## 2016-06-15 DIAGNOSIS — E119 Type 2 diabetes mellitus without complications: Secondary | ICD-10-CM | POA: Diagnosis not present

## 2016-06-16 DIAGNOSIS — E1165 Type 2 diabetes mellitus with hyperglycemia: Secondary | ICD-10-CM | POA: Diagnosis not present

## 2016-06-16 DIAGNOSIS — R52 Pain, unspecified: Secondary | ICD-10-CM | POA: Diagnosis not present

## 2016-06-16 DIAGNOSIS — Z7409 Other reduced mobility: Secondary | ICD-10-CM | POA: Diagnosis not present

## 2016-06-16 DIAGNOSIS — M4800 Spinal stenosis, site unspecified: Secondary | ICD-10-CM | POA: Diagnosis not present

## 2016-06-16 DIAGNOSIS — R7989 Other specified abnormal findings of blood chemistry: Secondary | ICD-10-CM | POA: Diagnosis not present

## 2016-06-16 DIAGNOSIS — M6281 Muscle weakness (generalized): Secondary | ICD-10-CM | POA: Diagnosis not present

## 2016-06-16 DIAGNOSIS — Z885 Allergy status to narcotic agent status: Secondary | ICD-10-CM | POA: Diagnosis not present

## 2016-06-16 DIAGNOSIS — J069 Acute upper respiratory infection, unspecified: Secondary | ICD-10-CM | POA: Diagnosis not present

## 2016-06-16 DIAGNOSIS — Z7984 Long term (current) use of oral hypoglycemic drugs: Secondary | ICD-10-CM | POA: Diagnosis not present

## 2016-06-16 DIAGNOSIS — M109 Gout, unspecified: Secondary | ICD-10-CM | POA: Diagnosis not present

## 2016-06-16 DIAGNOSIS — K219 Gastro-esophageal reflux disease without esophagitis: Secondary | ICD-10-CM | POA: Diagnosis not present

## 2016-06-16 DIAGNOSIS — Z880 Allergy status to penicillin: Secondary | ICD-10-CM | POA: Diagnosis not present

## 2016-06-16 DIAGNOSIS — R079 Chest pain, unspecified: Secondary | ICD-10-CM | POA: Diagnosis not present

## 2016-06-16 DIAGNOSIS — N390 Urinary tract infection, site not specified: Secondary | ICD-10-CM | POA: Diagnosis not present

## 2016-06-16 DIAGNOSIS — F329 Major depressive disorder, single episode, unspecified: Secondary | ICD-10-CM | POA: Diagnosis not present

## 2016-06-16 DIAGNOSIS — Z7902 Long term (current) use of antithrombotics/antiplatelets: Secondary | ICD-10-CM | POA: Diagnosis not present

## 2016-06-16 DIAGNOSIS — E785 Hyperlipidemia, unspecified: Secondary | ICD-10-CM | POA: Diagnosis not present

## 2016-06-16 DIAGNOSIS — Z955 Presence of coronary angioplasty implant and graft: Secondary | ICD-10-CM | POA: Diagnosis not present

## 2016-06-16 DIAGNOSIS — E119 Type 2 diabetes mellitus without complications: Secondary | ICD-10-CM | POA: Diagnosis not present

## 2016-06-16 DIAGNOSIS — I2510 Atherosclerotic heart disease of native coronary artery without angina pectoris: Secondary | ICD-10-CM | POA: Diagnosis not present

## 2016-06-16 DIAGNOSIS — R918 Other nonspecific abnormal finding of lung field: Secondary | ICD-10-CM | POA: Diagnosis not present

## 2016-06-16 DIAGNOSIS — R531 Weakness: Secondary | ICD-10-CM | POA: Diagnosis not present

## 2016-06-16 DIAGNOSIS — I10 Essential (primary) hypertension: Secondary | ICD-10-CM | POA: Diagnosis not present

## 2016-06-16 DIAGNOSIS — Z79899 Other long term (current) drug therapy: Secondary | ICD-10-CM | POA: Diagnosis not present

## 2016-06-17 DIAGNOSIS — R531 Weakness: Secondary | ICD-10-CM | POA: Diagnosis not present

## 2016-06-17 DIAGNOSIS — R51 Headache: Secondary | ICD-10-CM | POA: Diagnosis not present

## 2016-06-17 DIAGNOSIS — R700 Elevated erythrocyte sedimentation rate: Secondary | ICD-10-CM | POA: Diagnosis not present

## 2016-06-17 DIAGNOSIS — Z7409 Other reduced mobility: Secondary | ICD-10-CM | POA: Diagnosis not present

## 2016-06-17 DIAGNOSIS — M353 Polymyalgia rheumatica: Secondary | ICD-10-CM | POA: Diagnosis not present

## 2016-06-18 DIAGNOSIS — Z7409 Other reduced mobility: Secondary | ICD-10-CM | POA: Diagnosis not present

## 2016-06-18 DIAGNOSIS — R531 Weakness: Secondary | ICD-10-CM | POA: Diagnosis not present

## 2016-06-18 DIAGNOSIS — M353 Polymyalgia rheumatica: Secondary | ICD-10-CM | POA: Diagnosis not present

## 2016-06-18 DIAGNOSIS — R700 Elevated erythrocyte sedimentation rate: Secondary | ICD-10-CM | POA: Diagnosis not present

## 2016-06-19 DIAGNOSIS — R531 Weakness: Secondary | ICD-10-CM | POA: Diagnosis not present

## 2016-06-19 DIAGNOSIS — Z7409 Other reduced mobility: Secondary | ICD-10-CM | POA: Diagnosis not present

## 2016-06-20 DIAGNOSIS — R2681 Unsteadiness on feet: Secondary | ICD-10-CM | POA: Diagnosis not present

## 2016-06-20 DIAGNOSIS — M109 Gout, unspecified: Secondary | ICD-10-CM | POA: Diagnosis not present

## 2016-06-20 DIAGNOSIS — E119 Type 2 diabetes mellitus without complications: Secondary | ICD-10-CM | POA: Diagnosis not present

## 2016-06-20 DIAGNOSIS — M4800 Spinal stenosis, site unspecified: Secondary | ICD-10-CM | POA: Diagnosis not present

## 2016-06-20 DIAGNOSIS — M069 Rheumatoid arthritis, unspecified: Secondary | ICD-10-CM | POA: Diagnosis not present

## 2016-06-20 DIAGNOSIS — I2510 Atherosclerotic heart disease of native coronary artery without angina pectoris: Secondary | ICD-10-CM | POA: Diagnosis not present

## 2016-06-20 DIAGNOSIS — J069 Acute upper respiratory infection, unspecified: Secondary | ICD-10-CM | POA: Diagnosis not present

## 2016-06-20 DIAGNOSIS — I10 Essential (primary) hypertension: Secondary | ICD-10-CM | POA: Diagnosis not present

## 2016-06-20 DIAGNOSIS — F329 Major depressive disorder, single episode, unspecified: Secondary | ICD-10-CM | POA: Diagnosis not present

## 2016-06-20 DIAGNOSIS — Z7901 Long term (current) use of anticoagulants: Secondary | ICD-10-CM | POA: Diagnosis not present

## 2016-06-21 DIAGNOSIS — J069 Acute upper respiratory infection, unspecified: Secondary | ICD-10-CM | POA: Diagnosis not present

## 2016-06-21 DIAGNOSIS — M069 Rheumatoid arthritis, unspecified: Secondary | ICD-10-CM | POA: Diagnosis not present

## 2016-06-21 DIAGNOSIS — E119 Type 2 diabetes mellitus without complications: Secondary | ICD-10-CM | POA: Diagnosis not present

## 2016-06-21 DIAGNOSIS — M353 Polymyalgia rheumatica: Secondary | ICD-10-CM | POA: Diagnosis not present

## 2016-06-21 DIAGNOSIS — R2681 Unsteadiness on feet: Secondary | ICD-10-CM | POA: Diagnosis not present

## 2016-06-21 DIAGNOSIS — Z758 Other problems related to medical facilities and other health care: Secondary | ICD-10-CM | POA: Diagnosis not present

## 2016-06-21 DIAGNOSIS — I2510 Atherosclerotic heart disease of native coronary artery without angina pectoris: Secondary | ICD-10-CM | POA: Diagnosis not present

## 2016-06-21 DIAGNOSIS — M4800 Spinal stenosis, site unspecified: Secondary | ICD-10-CM | POA: Diagnosis not present

## 2016-06-21 DIAGNOSIS — M8580 Other specified disorders of bone density and structure, unspecified site: Secondary | ICD-10-CM | POA: Diagnosis not present

## 2016-06-22 DIAGNOSIS — M4800 Spinal stenosis, site unspecified: Secondary | ICD-10-CM | POA: Diagnosis not present

## 2016-06-22 DIAGNOSIS — R2681 Unsteadiness on feet: Secondary | ICD-10-CM | POA: Diagnosis not present

## 2016-06-22 DIAGNOSIS — J069 Acute upper respiratory infection, unspecified: Secondary | ICD-10-CM | POA: Diagnosis not present

## 2016-06-22 DIAGNOSIS — I2510 Atherosclerotic heart disease of native coronary artery without angina pectoris: Secondary | ICD-10-CM | POA: Diagnosis not present

## 2016-06-22 DIAGNOSIS — E119 Type 2 diabetes mellitus without complications: Secondary | ICD-10-CM | POA: Diagnosis not present

## 2016-06-22 DIAGNOSIS — M069 Rheumatoid arthritis, unspecified: Secondary | ICD-10-CM | POA: Diagnosis not present

## 2016-06-23 DIAGNOSIS — M064 Inflammatory polyarthropathy: Secondary | ICD-10-CM | POA: Diagnosis not present

## 2016-06-23 DIAGNOSIS — Z7952 Long term (current) use of systemic steroids: Secondary | ICD-10-CM | POA: Diagnosis not present

## 2016-06-23 DIAGNOSIS — R700 Elevated erythrocyte sedimentation rate: Secondary | ICD-10-CM | POA: Diagnosis not present

## 2016-06-23 DIAGNOSIS — M353 Polymyalgia rheumatica: Secondary | ICD-10-CM | POA: Diagnosis not present

## 2016-06-24 DIAGNOSIS — E119 Type 2 diabetes mellitus without complications: Secondary | ICD-10-CM | POA: Diagnosis not present

## 2016-06-24 DIAGNOSIS — I2510 Atherosclerotic heart disease of native coronary artery without angina pectoris: Secondary | ICD-10-CM | POA: Diagnosis not present

## 2016-06-24 DIAGNOSIS — M4800 Spinal stenosis, site unspecified: Secondary | ICD-10-CM | POA: Diagnosis not present

## 2016-06-24 DIAGNOSIS — R2681 Unsteadiness on feet: Secondary | ICD-10-CM | POA: Diagnosis not present

## 2016-06-24 DIAGNOSIS — J069 Acute upper respiratory infection, unspecified: Secondary | ICD-10-CM | POA: Diagnosis not present

## 2016-06-24 DIAGNOSIS — M069 Rheumatoid arthritis, unspecified: Secondary | ICD-10-CM | POA: Diagnosis not present

## 2016-06-27 DIAGNOSIS — I2510 Atherosclerotic heart disease of native coronary artery without angina pectoris: Secondary | ICD-10-CM | POA: Diagnosis not present

## 2016-06-27 DIAGNOSIS — R2681 Unsteadiness on feet: Secondary | ICD-10-CM | POA: Diagnosis not present

## 2016-06-27 DIAGNOSIS — M069 Rheumatoid arthritis, unspecified: Secondary | ICD-10-CM | POA: Diagnosis not present

## 2016-06-27 DIAGNOSIS — J069 Acute upper respiratory infection, unspecified: Secondary | ICD-10-CM | POA: Diagnosis not present

## 2016-06-27 DIAGNOSIS — M4800 Spinal stenosis, site unspecified: Secondary | ICD-10-CM | POA: Diagnosis not present

## 2016-06-27 DIAGNOSIS — E119 Type 2 diabetes mellitus without complications: Secondary | ICD-10-CM | POA: Diagnosis not present

## 2016-06-28 DIAGNOSIS — R2681 Unsteadiness on feet: Secondary | ICD-10-CM | POA: Diagnosis not present

## 2016-06-28 DIAGNOSIS — M069 Rheumatoid arthritis, unspecified: Secondary | ICD-10-CM | POA: Diagnosis not present

## 2016-06-28 DIAGNOSIS — M4800 Spinal stenosis, site unspecified: Secondary | ICD-10-CM | POA: Diagnosis not present

## 2016-06-28 DIAGNOSIS — J069 Acute upper respiratory infection, unspecified: Secondary | ICD-10-CM | POA: Diagnosis not present

## 2016-06-28 DIAGNOSIS — E119 Type 2 diabetes mellitus without complications: Secondary | ICD-10-CM | POA: Diagnosis not present

## 2016-06-28 DIAGNOSIS — I2510 Atherosclerotic heart disease of native coronary artery without angina pectoris: Secondary | ICD-10-CM | POA: Diagnosis not present

## 2016-06-30 DIAGNOSIS — M069 Rheumatoid arthritis, unspecified: Secondary | ICD-10-CM | POA: Diagnosis not present

## 2016-06-30 DIAGNOSIS — R2681 Unsteadiness on feet: Secondary | ICD-10-CM | POA: Diagnosis not present

## 2016-06-30 DIAGNOSIS — M064 Inflammatory polyarthropathy: Secondary | ICD-10-CM | POA: Diagnosis not present

## 2016-06-30 DIAGNOSIS — J069 Acute upper respiratory infection, unspecified: Secondary | ICD-10-CM | POA: Diagnosis not present

## 2016-06-30 DIAGNOSIS — B199 Unspecified viral hepatitis without hepatic coma: Secondary | ICD-10-CM | POA: Diagnosis not present

## 2016-06-30 DIAGNOSIS — E119 Type 2 diabetes mellitus without complications: Secondary | ICD-10-CM | POA: Diagnosis not present

## 2016-06-30 DIAGNOSIS — I2510 Atherosclerotic heart disease of native coronary artery without angina pectoris: Secondary | ICD-10-CM | POA: Diagnosis not present

## 2016-06-30 DIAGNOSIS — M4800 Spinal stenosis, site unspecified: Secondary | ICD-10-CM | POA: Diagnosis not present

## 2016-07-01 DIAGNOSIS — M4800 Spinal stenosis, site unspecified: Secondary | ICD-10-CM | POA: Diagnosis not present

## 2016-07-01 DIAGNOSIS — E119 Type 2 diabetes mellitus without complications: Secondary | ICD-10-CM | POA: Diagnosis not present

## 2016-07-01 DIAGNOSIS — R2681 Unsteadiness on feet: Secondary | ICD-10-CM | POA: Diagnosis not present

## 2016-07-01 DIAGNOSIS — M069 Rheumatoid arthritis, unspecified: Secondary | ICD-10-CM | POA: Diagnosis not present

## 2016-07-01 DIAGNOSIS — J069 Acute upper respiratory infection, unspecified: Secondary | ICD-10-CM | POA: Diagnosis not present

## 2016-07-01 DIAGNOSIS — I2510 Atherosclerotic heart disease of native coronary artery without angina pectoris: Secondary | ICD-10-CM | POA: Diagnosis not present

## 2016-07-05 DIAGNOSIS — M069 Rheumatoid arthritis, unspecified: Secondary | ICD-10-CM | POA: Diagnosis not present

## 2016-07-05 DIAGNOSIS — I2510 Atherosclerotic heart disease of native coronary artery without angina pectoris: Secondary | ICD-10-CM | POA: Diagnosis not present

## 2016-07-05 DIAGNOSIS — E119 Type 2 diabetes mellitus without complications: Secondary | ICD-10-CM | POA: Diagnosis not present

## 2016-07-05 DIAGNOSIS — M4800 Spinal stenosis, site unspecified: Secondary | ICD-10-CM | POA: Diagnosis not present

## 2016-07-05 DIAGNOSIS — R2681 Unsteadiness on feet: Secondary | ICD-10-CM | POA: Diagnosis not present

## 2016-07-05 DIAGNOSIS — J069 Acute upper respiratory infection, unspecified: Secondary | ICD-10-CM | POA: Diagnosis not present

## 2016-07-06 DIAGNOSIS — M353 Polymyalgia rheumatica: Secondary | ICD-10-CM | POA: Diagnosis not present

## 2016-07-06 DIAGNOSIS — E790 Hyperuricemia without signs of inflammatory arthritis and tophaceous disease: Secondary | ICD-10-CM | POA: Diagnosis not present

## 2016-07-06 DIAGNOSIS — E119 Type 2 diabetes mellitus without complications: Secondary | ICD-10-CM | POA: Diagnosis not present

## 2016-07-07 DIAGNOSIS — E119 Type 2 diabetes mellitus without complications: Secondary | ICD-10-CM | POA: Diagnosis not present

## 2016-07-07 DIAGNOSIS — M069 Rheumatoid arthritis, unspecified: Secondary | ICD-10-CM | POA: Diagnosis not present

## 2016-07-07 DIAGNOSIS — R2681 Unsteadiness on feet: Secondary | ICD-10-CM | POA: Diagnosis not present

## 2016-07-07 DIAGNOSIS — M4800 Spinal stenosis, site unspecified: Secondary | ICD-10-CM | POA: Diagnosis not present

## 2016-07-07 DIAGNOSIS — I2510 Atherosclerotic heart disease of native coronary artery without angina pectoris: Secondary | ICD-10-CM | POA: Diagnosis not present

## 2016-07-07 DIAGNOSIS — J069 Acute upper respiratory infection, unspecified: Secondary | ICD-10-CM | POA: Diagnosis not present

## 2016-07-11 DIAGNOSIS — M069 Rheumatoid arthritis, unspecified: Secondary | ICD-10-CM | POA: Diagnosis not present

## 2016-07-11 DIAGNOSIS — I2510 Atherosclerotic heart disease of native coronary artery without angina pectoris: Secondary | ICD-10-CM | POA: Diagnosis not present

## 2016-07-11 DIAGNOSIS — R2681 Unsteadiness on feet: Secondary | ICD-10-CM | POA: Diagnosis not present

## 2016-07-11 DIAGNOSIS — M4800 Spinal stenosis, site unspecified: Secondary | ICD-10-CM | POA: Diagnosis not present

## 2016-07-11 DIAGNOSIS — J069 Acute upper respiratory infection, unspecified: Secondary | ICD-10-CM | POA: Diagnosis not present

## 2016-07-11 DIAGNOSIS — E119 Type 2 diabetes mellitus without complications: Secondary | ICD-10-CM | POA: Diagnosis not present

## 2016-07-12 DIAGNOSIS — M4800 Spinal stenosis, site unspecified: Secondary | ICD-10-CM | POA: Diagnosis not present

## 2016-07-12 DIAGNOSIS — M069 Rheumatoid arthritis, unspecified: Secondary | ICD-10-CM | POA: Diagnosis not present

## 2016-07-12 DIAGNOSIS — R2681 Unsteadiness on feet: Secondary | ICD-10-CM | POA: Diagnosis not present

## 2016-07-12 DIAGNOSIS — I2510 Atherosclerotic heart disease of native coronary artery without angina pectoris: Secondary | ICD-10-CM | POA: Diagnosis not present

## 2016-07-12 DIAGNOSIS — E119 Type 2 diabetes mellitus without complications: Secondary | ICD-10-CM | POA: Diagnosis not present

## 2016-07-12 DIAGNOSIS — J069 Acute upper respiratory infection, unspecified: Secondary | ICD-10-CM | POA: Diagnosis not present

## 2016-07-13 DIAGNOSIS — M0579 Rheumatoid arthritis with rheumatoid factor of multiple sites without organ or systems involvement: Secondary | ICD-10-CM | POA: Diagnosis not present

## 2016-07-13 DIAGNOSIS — Z7952 Long term (current) use of systemic steroids: Secondary | ICD-10-CM | POA: Diagnosis not present

## 2016-07-13 DIAGNOSIS — E790 Hyperuricemia without signs of inflammatory arthritis and tophaceous disease: Secondary | ICD-10-CM | POA: Diagnosis not present

## 2016-07-13 DIAGNOSIS — E119 Type 2 diabetes mellitus without complications: Secondary | ICD-10-CM | POA: Diagnosis not present

## 2016-07-19 DIAGNOSIS — E119 Type 2 diabetes mellitus without complications: Secondary | ICD-10-CM | POA: Diagnosis not present

## 2016-07-19 DIAGNOSIS — J069 Acute upper respiratory infection, unspecified: Secondary | ICD-10-CM | POA: Diagnosis not present

## 2016-07-19 DIAGNOSIS — R2681 Unsteadiness on feet: Secondary | ICD-10-CM | POA: Diagnosis not present

## 2016-07-19 DIAGNOSIS — M4800 Spinal stenosis, site unspecified: Secondary | ICD-10-CM | POA: Diagnosis not present

## 2016-07-19 DIAGNOSIS — I2510 Atherosclerotic heart disease of native coronary artery without angina pectoris: Secondary | ICD-10-CM | POA: Diagnosis not present

## 2016-07-19 DIAGNOSIS — M069 Rheumatoid arthritis, unspecified: Secondary | ICD-10-CM | POA: Diagnosis not present

## 2016-07-20 DIAGNOSIS — E119 Type 2 diabetes mellitus without complications: Secondary | ICD-10-CM | POA: Diagnosis not present

## 2016-07-20 DIAGNOSIS — M8580 Other specified disorders of bone density and structure, unspecified site: Secondary | ICD-10-CM | POA: Diagnosis not present

## 2016-07-20 DIAGNOSIS — N289 Disorder of kidney and ureter, unspecified: Secondary | ICD-10-CM | POA: Diagnosis not present

## 2016-07-20 DIAGNOSIS — M353 Polymyalgia rheumatica: Secondary | ICD-10-CM | POA: Diagnosis not present

## 2016-07-20 DIAGNOSIS — E559 Vitamin D deficiency, unspecified: Secondary | ICD-10-CM | POA: Diagnosis not present

## 2016-08-01 DIAGNOSIS — E119 Type 2 diabetes mellitus without complications: Secondary | ICD-10-CM | POA: Diagnosis not present

## 2016-08-01 DIAGNOSIS — M353 Polymyalgia rheumatica: Secondary | ICD-10-CM | POA: Diagnosis not present

## 2016-08-16 DIAGNOSIS — R5383 Other fatigue: Secondary | ICD-10-CM | POA: Diagnosis not present

## 2016-08-16 DIAGNOSIS — R740 Nonspecific elevation of levels of transaminase and lactic acid dehydrogenase [LDH]: Secondary | ICD-10-CM | POA: Diagnosis not present

## 2016-08-16 DIAGNOSIS — M0579 Rheumatoid arthritis with rheumatoid factor of multiple sites without organ or systems involvement: Secondary | ICD-10-CM | POA: Diagnosis not present

## 2016-09-12 DIAGNOSIS — Z9225 Personal history of immunosupression therapy: Secondary | ICD-10-CM | POA: Diagnosis not present

## 2016-09-12 DIAGNOSIS — M0579 Rheumatoid arthritis with rheumatoid factor of multiple sites without organ or systems involvement: Secondary | ICD-10-CM | POA: Diagnosis not present

## 2016-09-12 DIAGNOSIS — Z79899 Other long term (current) drug therapy: Secondary | ICD-10-CM | POA: Diagnosis not present

## 2016-09-12 DIAGNOSIS — Z7952 Long term (current) use of systemic steroids: Secondary | ICD-10-CM | POA: Diagnosis not present

## 2016-09-14 DIAGNOSIS — E785 Hyperlipidemia, unspecified: Secondary | ICD-10-CM | POA: Diagnosis not present

## 2016-09-14 DIAGNOSIS — M8580 Other specified disorders of bone density and structure, unspecified site: Secondary | ICD-10-CM | POA: Diagnosis not present

## 2016-09-14 DIAGNOSIS — E559 Vitamin D deficiency, unspecified: Secondary | ICD-10-CM | POA: Diagnosis not present

## 2016-09-14 DIAGNOSIS — E119 Type 2 diabetes mellitus without complications: Secondary | ICD-10-CM | POA: Diagnosis not present

## 2016-09-14 DIAGNOSIS — M353 Polymyalgia rheumatica: Secondary | ICD-10-CM | POA: Diagnosis not present

## 2016-09-14 DIAGNOSIS — E1165 Type 2 diabetes mellitus with hyperglycemia: Secondary | ICD-10-CM | POA: Diagnosis not present

## 2016-10-14 DIAGNOSIS — M0579 Rheumatoid arthritis with rheumatoid factor of multiple sites without organ or systems involvement: Secondary | ICD-10-CM | POA: Insufficient documentation

## 2016-10-14 DIAGNOSIS — Z79899 Other long term (current) drug therapy: Secondary | ICD-10-CM | POA: Insufficient documentation

## 2016-10-14 DIAGNOSIS — R7982 Elevated C-reactive protein (CRP): Secondary | ICD-10-CM | POA: Insufficient documentation

## 2016-10-14 DIAGNOSIS — R700 Elevated erythrocyte sedimentation rate: Secondary | ICD-10-CM | POA: Insufficient documentation

## 2016-10-14 NOTE — Progress Notes (Signed)
Office Visit Note  Patient: Sophia Ward             Date of Birth: 1933-01-25           MRN: [id number]             PCP: [name], MD Referring: [name], MD Visit Date: 10/28/2016 Occupation: @GUAROCC @    Subjective:  Pain hands    History of Present Illness: Sophia Ward is a 83 y.o. female with history of rheumatoid arthritis and gout. She is self-referred  herself for evaluation of her symptoms. According to patient in January 2018 she started having difficulty walking, difficulty getting out of the bed and getting out of the chair she also had difficulty raising her arms. She was having some discomfort in her hands. She went to the emergency room where she was diagnosed with polymyalgia rheumatica and was started on prednisone 15 mg a day. She states the next day she had almost no symptoms. After that she was referred to Dr. Philbert Riser who evaluated her and increase her prednisone to 20 mg a day he also did some lab work which came positive for rheumatoid factor. He diagnosed her with rheumatoid arthritis and started her on methotrexate 6 tablets per week on 07/13/2016. She is continued on the same dose of methotrexate. Her prednisone was decreased to 15 mg a day in March and then taper down gradually to 10 mg and then 5 mg. When she came down to 5 mg a day in May 2018 her symptoms started coming back with increased pain and discomfort in multiple joints. She had to increase her prednisone back to 10 mg a day. She's having some pain and discomfort in her bilateral hands intermittent swelling and pain in her right knee joint. Gives history of gout for the last 3 years. Her last episode of gout was in January 2018. She was on allopurinol in January and then after 30 days she stopped it. She feels depressed due to her current illness and was tearful during the conversation.  Activities of Daily Living:  Patient reports morning stiffness for 4-5 hours.   Patient Denies  nocturnal pain.  Difficulty dressing/grooming: Denies Difficulty climbing stairs: Reports Difficulty getting out of chair: Reports Difficulty using hands for taps, buttons, cutlery, and/or writing: Reports   Review of Systems  Constitutional: Positive for fatigue. Negative for night sweats, weight gain, weight loss and weakness.  HENT: Positive for mouth dryness. Negative for mouth sores, trouble swallowing, trouble swallowing and nose dryness.   Eyes: Positive for dryness. Negative for pain, redness and visual disturbance.  Respiratory: Negative for cough, shortness of breath and difficulty breathing.   Cardiovascular: Negative for chest pain, palpitations, hypertension, irregular heartbeat and swelling in legs/feet.  Gastrointestinal: Negative for blood in stool, constipation and diarrhea.  Endocrine: Negative for increased urination.  Genitourinary: Negative for vaginal dryness.  Musculoskeletal: Positive for arthralgias, joint pain, joint swelling, myalgias, morning stiffness and myalgias. Negative for muscle weakness and muscle tenderness.  Skin: Negative for color change, rash, hair loss, skin tightness, ulcers and sensitivity to sunlight.  Allergic/Immunologic: Negative for susceptible to infections.  Neurological: Negative for dizziness, memory loss and night sweats.  Hematological: Negative for swollen glands.  Psychiatric/Behavioral: Positive for depressed mood. Negative for sleep disturbance. The patient is nervous/anxious.     PMFS History:  Patient Active Problem List   Diagnosis Date Noted  . 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 Osteoarthritis of lumbar  spine 10/28/2016  . History of diabetes mellitus 10/27/2016  . History of gastroesophageal reflux (GERD) 10/27/2016  . Rheumatoid arthritis involving multiple sites with positive rheumatoid factor ([place]) 10/14/2016  . Elevated C-reactive protein (CRP) 10/14/2016  . Elevated sed rate 10/14/2016  . High  risk medication use 10/14/2016  . Coronary atherosclerosis of native coronary artery 01/30/2014  . Mixed hyperlipidemia 01/30/2014  . Essential hypertension, benign 01/30/2014  . Obesity, unspecified 01/30/2014    Past Medical History:  Diagnosis Date  . Allergic rhinitis   . Anemia   . Arthritis    hands  . BPPV (benign paroxysmal positional vertigo)   . Coronary artery disease   . Coronary atherosclerosis of native coronary artery 01/30/2014  . Diabetes mellitus without complication ([place])   . Essential hypertension, benign 01/30/2014  . GERD (gastroesophageal reflux disease)   . Hypertension   . Mixed hyperlipidemia 01/30/2014  . Myocardial infarction ([place]) 2004   mild MI-no damage- had stents  . Osteopenia   . Post-menopausal   . Stenosing tenosynovitis     Family History  Problem Relation Age of Onset  . CAD Mother   . Heart attack Mother   . CAD Father    Past Surgical History:  Procedure Laterality Date  . ABDOMINAL HYSTERECTOMY    . APPENDECTOMY    . CARDIAC CATHETERIZATION  2004  . carpel tunnel    . COLONOSCOPY WITH PROPOFOL N/A 10/30/2012   Procedure: COLONOSCOPY WITH PROPOFOL;  Surgeon: Garlan Fair, MD;  Location: [facility];  Service: Endoscopy;  Laterality: N/A;  . CORONARY ANGIOPLASTY  2004  . DILATION AND CURETTAGE OF UTERUS    . ESOPHAGOGASTRODUODENOSCOPY (EGD) WITH PROPOFOL N/A 10/30/2012   Procedure: ESOPHAGOGASTRODUODENOSCOPY (EGD) WITH PROPOFOL;  Surgeon: Garlan Fair, MD;  [facility];  Service: Endoscopy;  Laterality: N/A;  . EYE SURGERY     bilateral cataract with lens implant  . left shouler surgery    . TONSILLECTOMY     Social History   Social History Narrative  . No narrative on file     Objective: Vital Signs: BP (!) 152/62   Pulse 70   Resp 14   Ht 5' 2.5" (1.588 m)   Wt 177 lb (80.3 kg)   BMI 31.86 kg/m    Physical Exam  Constitutional: She is oriented to person, place, and time. She appears well-developed and  well-nourished.  HENT:  Head: Normocephalic and atraumatic.  Eyes: Conjunctivae and EOM are normal.  Neck: Normal range of motion.  Cardiovascular: Normal rate, regular rhythm, normal heart sounds and intact distal pulses.   Pulmonary/Chest: Effort normal and breath sounds normal.  Abdominal: Soft. Bowel sounds are normal.  Lymphadenopathy:    She has no cervical adenopathy.  Neurological: She is alert and oriented to person, place, and time.  Skin: Skin is warm and dry. Capillary refill takes less than 2 seconds.  Psychiatric: She has a normal mood and affect. Her behavior is normal.  Nursing note and vitals reviewed.    Musculoskeletal Exam: C-spine and thoracic spine good range of motion. Lumbar spine limited range of motion. Shoulder joints are good range of motion without any discomfort. Elbow joints are good range of motion her bilateral wrist joint has extension 30 and flexion 50 without any tenderness no swelling or synovitis over MCPs was noted. She has some DIP PIP thickening bilaterally consistent with osteoarthritis. Hip joints knee joints are good range of motion. No swelling or synovitis or ankles or MTP  joints were noted. She is warmth and swelling in her right knee joint.  CDAI Exam: CDAI Homunculus Exam:   Tenderness:  RLE: tibiofemoral  Swelling:  RLE: tibiofemoral  Joint Counts:  CDAI Tender Joint count: 1 CDAI Swollen Joint count: 1  Global Assessments:  Patient Global Assessment: 6 Provider Global Assessment: 6  CDAI Calculated Score: 14   Investigation: Findings:  06/23/2016 RF elevated 309 ANA negative 05/31/2016 uric acid 8.4 ESR 102 06/07/2016 ESR 91 06/30/2016 CCP less than 1614 33 eta less than 0.2 ESR 19 07/13/2016 Normal Creat. 0.81 GFR 68 Alk phos 37 AST 12 ALT 12, CBC normal,  CRP 5.8 05/06/2015 DEXA scans by Dr. use and showed T score of -1.2 in the left femoral neck    Imaging: Xr Foot 2 Views Left  Result Date: 10/28/2016 PIP/DIP  narrowing was noted. Left first MTP joint narrowing was noted. The posterior calcaneal spur was noted. No erosive changes were noted. Impression findings were consistent with osteoarthritis of the foot  Xr Foot 2 Views Right  Result Date: 10/28/2016 PIP/DIP narrowing was noted. Left first MTP joint narrowing was noted. The posterior calcaneal spur was noted. No erosive changes were noted. Impression findings were consistent with osteoarthritis of the foot  Xr Hand 2 View Left  Result Date: 10/28/2016 PIP/DIP narrowing without MCP or intercarpal joint space narrowing severe CMC narrowing. Impression findings consistent with osteoarthritis  Xr Hand 2 View Right  Result Date: 10/28/2016 PIP/DIP narrowing without MCP or intercarpal joint space narrowing severe CMC narrowing. Impression findings consistent with osteoarthritis  Xr Knee 3 View Left  Result Date: 10/28/2016 Moderate medial compartment narrowing with intercondylar osteophytes was noted. No chondrocalcinosis was noted. Moderate patellofemoral narrowing was noted. Impression: Findings are consistent with moderate osteoarthritis and moderate chondromalacia patella  Xr Knee 3 View Right  Result Date: 10/28/2016 Moderate medial compartment narrowing with intercondylar osteophytes was noted. No chondrocalcinosis was noted. Moderate patellofemoral narrowing was noted. Impression: Findings are consistent with moderate osteoarthritis and moderate chondromalacia patella   Speciality Comments: No specialty comments available.    Procedures:  No procedures performed Allergies: Codeine and Penicillins   Assessment / Plan:     Visit Diagnoses: Polymyalgia rheumatica ([place]): Appears to be most likely diagnosis. Patient initially presented with proximal muscle weakness and tenderness she had difficulty getting up from chair and has difficulty getting out of the bed. She also had difficulty raising her arms. Her symptoms responded to prednisone  overnight. She did really well on prednisone anywhere from 15-20 mg and when she came down to 5 mg her symptoms flared again with increased pain and discomfort in multiple joints. She is back on 10 mg of prednisone and still having proximal muscle pain and tenderness. The she's having flare of her symptoms. We will increase her prednisone to 15 mg by mouth daily for a month. She will get on a sliding scale with her PCP. After 1 month I will decrease her prednisone to 10 mg by mouth daily and then will taper by 1 mg every month. Prescriptions for prednisone and folic acid were given today.  Rheumatoid arthritis involving multiple sites with positive rheumatoid factor ([facility]) -  RF 309 , ANA negative, CRP 5.8. She has history of pain and discomfort in her bilateral hands and her right knee joint is swollen and wonder if she has an overlap with rheumatoid arthritis she also has positive rheumatoid factor.  High risk medication use - on MTX 6 tabs po q wk,  Rheumate i tab qd and Prednisone10 mg po qd from [facility] Rheumatologist Dr. Philbert Riser. I do not have any follow-up labs on her after starting methotrexate. We will check her labs today. She has had pneumococcal vaccine. We discussed getting Shingrix.  Elevated sed rate  Pain in both hands -she has PIP/DIP thickening in her bilateral hands consistent with osteoarthritis Plan: XR Hand 2 View Right, XR Hand 2 View Left. X-rays are consistent with moderate to severe osteoarthritis  Chronic pain of both knees -she is warmth and swelling in her right knee joint Plan: XR KNEE 3 VIEW RIGHT, XR KNEE 3 VIEW LEFT. X-rays were consistent with moderate osteoarthritis  Pain in both feet -doing better currently. Although she has had history of gout and had recurrent discomfort in her feet. She's not taking any treatment currently. Plan: XR Foot 2 Views Right, XR Foot 2 Views Left. Actions were consistent with osteoarthritis of feet  Chronic gout involving toe without  tophus, unspecified cause, unspecified laterality. She gives history of gout for the last 3 years. Her last episode was in January 2018. She was on allopurinol but isn't completely came off the medication. I will check her uric acid today and t plan on starting allopurinol if her uric acid is elevated.  DDD lumbar spine with spinal stenosis: According to patient she has had cortisone injections to her spine in the past.  Mixed hyperlipidemia  Atherosclerosis of native coronary artery  Essential hypertension, benign  History of diabetes mellitus: She is close monitoring of her blood sugar she on insulin. I've advised her to contact her PCP and get sliding scale. She also has appointment coming up with endocrinologist next month.  History of gastroesophageal reflux (GERD)  Osteopenia: T score minus 1.2 07/01/2014  Orders: Orders Placed This Encounter  Procedures  . XR Hand 2 View Right  . XR Hand 2 View Left  . XR KNEE 3 VIEW RIGHT  . XR KNEE 3 VIEW LEFT  . XR Foot 2 Views Right  . XR Foot 2 Views Left  . DG Chest 2 View  . CBC with Differential/Platelet  . COMPLETE METABOLIC PANEL WITH GFR  . Urinalysis, Routine w reflex microscopic  . Uric acid  . Quantiferon tb gold assay (blood)  . IgG, IgA, IgM  . Hepatitis B core antibody, IgM  . Sedimentation rate   Meds ordered this encounter  Medications  . predniSONE (DELTASONE) 5 MG tablet    Sig: Take 3 tablets (15 mg total) by mouth daily with breakfast. x1 month then 10 mg PO QD x1 month then continue to taper as instructed.    Dispense:  150 tablet    Refill:  0  . folic acid (FOLVITE) 1 MG tablet    Sig: Take 1 tablet (1 mg total) by mouth daily.    Dispense:  90 tablet    Refill:  3    Face-to-face time spent with patient was 90 minutes. 50% of time was spent in counseling and coordination of care.  Follow-Up Instructions: Return for Rheumatoid arthritis,PMR.   Bo Merino, MD  Note - This record has been  created using Editor, commissioning.  Chart creation errors have been sought, but may not always  have been located. Such creation errors do not reflect on  the standard of medical care.

## 2016-10-27 DIAGNOSIS — Z8719 Personal history of other diseases of the digestive system: Secondary | ICD-10-CM | POA: Insufficient documentation

## 2016-10-27 DIAGNOSIS — Z8639 Personal history of other endocrine, nutritional and metabolic disease: Secondary | ICD-10-CM | POA: Insufficient documentation

## 2016-10-28 ENCOUNTER — Ambulatory Visit (INDEPENDENT_AMBULATORY_CARE_PROVIDER_SITE_OTHER): Payer: Medicare Other | Admitting: Rheumatology

## 2016-10-28 ENCOUNTER — Ambulatory Visit (INDEPENDENT_AMBULATORY_CARE_PROVIDER_SITE_OTHER): Payer: Medicare Other

## 2016-10-28 ENCOUNTER — Encounter: Payer: Self-pay | Admitting: Rheumatology

## 2016-10-28 VITALS — BP 152/62 | HR 70 | Resp 14 | Ht 62.5 in | Wt 177.0 lb

## 2016-10-28 DIAGNOSIS — M79671 Pain in right foot: Secondary | ICD-10-CM

## 2016-10-28 DIAGNOSIS — R700 Elevated erythrocyte sedimentation rate: Secondary | ICD-10-CM | POA: Diagnosis not present

## 2016-10-28 DIAGNOSIS — M25562 Pain in left knee: Secondary | ICD-10-CM

## 2016-10-28 DIAGNOSIS — E782 Mixed hyperlipidemia: Secondary | ICD-10-CM | POA: Diagnosis not present

## 2016-10-28 DIAGNOSIS — I2510 Atherosclerotic heart disease of native coronary artery without angina pectoris: Secondary | ICD-10-CM

## 2016-10-28 DIAGNOSIS — M79642 Pain in left hand: Secondary | ICD-10-CM | POA: Diagnosis not present

## 2016-10-28 DIAGNOSIS — Z79899 Other long term (current) drug therapy: Secondary | ICD-10-CM | POA: Diagnosis not present

## 2016-10-28 DIAGNOSIS — M47816 Spondylosis without myelopathy or radiculopathy, lumbar region: Secondary | ICD-10-CM | POA: Diagnosis not present

## 2016-10-28 DIAGNOSIS — M1A9XX0 Chronic gout, unspecified, without tophus (tophi): Secondary | ICD-10-CM | POA: Diagnosis not present

## 2016-10-28 DIAGNOSIS — M353 Polymyalgia rheumatica: Secondary | ICD-10-CM | POA: Diagnosis not present

## 2016-10-28 DIAGNOSIS — M0579 Rheumatoid arthritis with rheumatoid factor of multiple sites without organ or systems involvement: Secondary | ICD-10-CM

## 2016-10-28 DIAGNOSIS — M25561 Pain in right knee: Secondary | ICD-10-CM

## 2016-10-28 DIAGNOSIS — M79641 Pain in right hand: Secondary | ICD-10-CM

## 2016-10-28 DIAGNOSIS — Z8639 Personal history of other endocrine, nutritional and metabolic disease: Secondary | ICD-10-CM

## 2016-10-28 DIAGNOSIS — M79672 Pain in left foot: Secondary | ICD-10-CM

## 2016-10-28 DIAGNOSIS — I10 Essential (primary) hypertension: Secondary | ICD-10-CM

## 2016-10-28 DIAGNOSIS — Z8719 Personal history of other diseases of the digestive system: Secondary | ICD-10-CM

## 2016-10-28 DIAGNOSIS — M19041 Primary osteoarthritis, right hand: Secondary | ICD-10-CM | POA: Diagnosis not present

## 2016-10-28 DIAGNOSIS — M19042 Primary osteoarthritis, left hand: Secondary | ICD-10-CM

## 2016-10-28 DIAGNOSIS — M8589 Other specified disorders of bone density and structure, multiple sites: Secondary | ICD-10-CM

## 2016-10-28 DIAGNOSIS — G8929 Other chronic pain: Secondary | ICD-10-CM

## 2016-10-28 LAB — CBC WITH DIFFERENTIAL/PLATELET
Basophils Absolute: 0 cells/uL (ref 0–200)
Basophils Relative: 0 %
Eosinophils Absolute: 184 cells/uL (ref 15–500)
Eosinophils Relative: 1 %
HCT: 41 % (ref 35.0–45.0)
Hemoglobin: 13.2 g/dL (ref 11.7–15.5)
Lymphocytes Relative: 10 %
Lymphs Abs: 1840 cells/uL (ref 850–3900)
MCH: 30.1 pg (ref 27.0–33.0)
MCHC: 32.2 g/dL (ref 32.0–36.0)
MCV: 93.4 fL (ref 80.0–100.0)
MPV: 9.3 fL (ref 7.5–12.5)
Monocytes Absolute: 1656 cells/uL — ABNORMAL HIGH (ref 200–950)
Monocytes Relative: 9 %
Neutro Abs: 14720 cells/uL — ABNORMAL HIGH (ref 1500–7800)
Neutrophils Relative %: 80 %
Platelets: 417 10*3/uL — ABNORMAL HIGH (ref 140–400)
RBC: 4.39 MIL/uL (ref 3.80–5.10)
RDW: 13.6 % (ref 11.0–15.0)
WBC: 18.4 10*3/uL — ABNORMAL HIGH (ref 3.8–10.8)

## 2016-10-28 LAB — COMPLETE METABOLIC PANEL WITH GFR
ALT: 9 U/L (ref 6–29)
AST: 10 U/L (ref 10–35)
Albumin: 4 g/dL (ref 3.6–5.1)
Alkaline Phosphatase: 38 U/L (ref 33–130)
BUN: 18 mg/dL (ref 7–25)
CO2: 24 mmol/L (ref 20–31)
Calcium: 9.8 mg/dL (ref 8.6–10.4)
Chloride: 102 mmol/L (ref 98–110)
Creat: 0.86 mg/dL (ref 0.60–0.88)
GFR, Est African American: 73 mL/min (ref >=60)
GFR, Est Non African American: 64 mL/min (ref >=60)
Glucose, Bld: 163 mg/dL — ABNORMAL HIGH (ref 65–99)
Potassium: 3.9 mmol/L (ref 3.5–5.3)
Sodium: 139 mmol/L (ref 135–146)
Total Bilirubin: 0.5 mg/dL (ref 0.2–1.2)
Total Protein: 7.3 g/dL (ref 6.1–8.1)

## 2016-10-28 LAB — SEDIMENTATION RATE: Sed Rate: 42 mm/hr — ABNORMAL HIGH (ref 0–30)

## 2016-10-28 LAB — HEPATITIS B CORE ANTIBODY, IGM: Hep B C IgM: NONREACTIVE

## 2016-10-28 MED ORDER — PREDNISONE 5 MG PO TABS: 15.0000 mg | ORAL_TABLET | Freq: Every day | ORAL | 0 refills | Status: DC

## 2016-10-28 MED ORDER — FOLIC ACID 1 MG PO TABS
1.0000 mg | ORAL_TABLET | Freq: Every day | ORAL | 3 refills | Status: DC
Start: 2016-10-28 — End: 2017-10-31

## 2016-10-28 NOTE — Patient Instructions (Addendum)
Please go to [facility] to get a chest x-ray  Please talk to your primary care provider about getting the Shingrix vaccine  Start taking prednisone 15 mg (three of the 5 mg tablets) by mouth daily for one month, then reduce dose to prednisone 10 mg (two of the 5 mg tablets) by mouth daily for one month.  We will then plan to decrease dose by 1 mg every month.    Continue methotrexate 6 tablets weekly Start taking folic acid 1 mg daily (replaced Rheumate)  Standing Labs We placed an order today for your standing lab work.    Please come back and get your standing labs in August 2018 and every 2 months.   We have open lab Monday through Friday from 8:30-11:30 AM and 1:30-4 PM at the office of Dr. Tresa Moore, PA.   The office is located at [address], [place], [place],  [address] No appointment is necessary.   Labs are drawn by [facility].  You may receive a bill from [facility] for your lab work. If you have any questions regarding directions or hours of operation,  please call [phone].    Methotrexate tablets What is this medicine? METHOTREXATE (METH oh TREX ate) is a chemotherapy drug used to treat cancer including breast cancer, leukemia, and lymphoma. This medicine can also be used to treat psoriasis and certain kinds of arthritis. This medicine may be used for other purposes; ask your health care provider or pharmacist if you have questions. COMMON BRAND NAME(S): Rheumatrex, Trexall What should I tell my health care provider before I take this medicine? They need to know if you have any of these conditions: -fluid in the stomach area or lungs -if you often drink alcohol -infection or immune system problems -kidney disease or on hemodialysis -liver disease -low blood counts, like low white cell, platelet, or red cell counts -lung disease -radiation therapy -stomach ulcers -ulcerative colitis -an unusual or allergic reaction to methotrexate, other  medicines, foods, dyes, or preservatives -pregnant or trying to get pregnant -breast-feeding How should I use this medicine? Take this medicine by mouth with a glass of water. Follow the directions on the prescription label. Take your medicine at regular intervals. Do not take it more often than directed. Do not stop taking except on your doctor's advice. Make sure you know why you are taking this medicine and how often you should take it. If this medicine is used for a condition that is not cancer, like arthritis or psoriasis, it should be taken weekly, NOT daily. Taking this medicine more often than directed can cause serious side effects, even death. Talk to your healthcare provider about safe handling and disposal of this medicine. You may need to take special precautions. Talk to your pediatrician regarding the use of this medicine in children. While this drug may be prescribed for selected conditions, precautions do apply. Overdosage: If you think you have taken too much of this medicine contact a poison control center or emergency room at once. NOTE: This medicine is only for you. Do not share this medicine with others. What if I miss a dose? If you miss a dose, talk with your doctor or health care professional. Do not take double or extra doses. What may interact with this medicine? This medicine may interact with the following medication: -acitretin -aspirin and aspirin-like medicines including salicylates -azathioprine -certain antibiotics like penicillins, tetracycline, and chloramphenicol -cyclosporine -gold -hydroxychloroquine -live virus vaccines -NSAIDs, medicines for pain and inflammation, like  ibuprofen or naproxen -other cytotoxic agents -penicillamine -phenylbutazone -phenytoin -probenecid -retinoids such as isotretinoin and tretinoin -steroid medicines like prednisone or cortisone -sulfonamides like sulfasalazine and trimethoprim/sulfamethoxazole -theophylline This  list may not describe all possible interactions. Give your health care provider a list of all the medicines, herbs, non-prescription drugs, or dietary supplements you use. Also tell them if you smoke, drink alcohol, or use illegal drugs. Some items may interact with your medicine. What should I watch for while using this medicine? Avoid alcoholic drinks. This medicine can make you more sensitive to the sun. Keep out of the sun. If you cannot avoid being in the sun, wear protective clothing and use sunscreen. Do not use sun lamps or tanning beds/booths. You may need blood work done while you are taking this medicine. Call your doctor or health care professional for advice if you get a fever, chills or sore throat, or other symptoms of a cold or flu. Do not treat yourself. This drug decreases your body's ability to fight infections. Try to avoid being around people who are sick. This medicine may increase your risk to bruise or bleed. Call your doctor or health care professional if you notice any unusual bleeding. Check with your doctor or health care professional if you get an attack of severe diarrhea, nausea and vomiting, or if you sweat a lot. The loss of too much body fluid can make it dangerous for you to take this medicine. Talk to your doctor about your risk of cancer. You may be more at risk for certain types of cancers if you take this medicine. Both men and women must use effective birth control with this medicine. Do not become pregnant while taking this medicine or until at least 1 normal menstrual cycle has occurred after stopping it. Women should inform their doctor if they wish to become pregnant or think they might be pregnant. Men should not father a child while taking this medicine and for 3 months after stopping it. There is a potential for serious side effects to an unborn child. Talk to your health care professional or pharmacist for more information. Do not breast-feed an infant while  taking this medicine. What side effects may I notice from receiving this medicine? Side effects that you should report to your doctor or health care professional as soon as possible: -allergic reactions like skin rash, itching or hives, swelling of the face, lips, or tongue -breathing problems or shortness of breath -diarrhea -dry, nonproductive cough -low blood counts - this medicine may decrease the number of white blood cells, red blood cells and platelets. You may be at increased risk for infections and bleeding. -mouth sores -redness, blistering, peeling or loosening of the skin, including inside the mouth -signs of infection - fever or chills, cough, sore throat, pain or trouble passing urine -signs and symptoms of bleeding such as bloody or black, tarry stools; red or dark-brown urine; spitting up blood or brown material that looks like coffee grounds; red spots on the skin; unusual bruising or bleeding from the eye, gums, or nose -signs and symptoms of kidney injury like trouble passing urine or change in the amount of urine -signs and symptoms of liver injury like dark yellow or brown urine; general ill feeling or flu-like symptoms; light-colored stools; loss of appetite; nausea; right upper belly pain; unusually weak or tired; yellowing of the eyes or skin Side effects that usually do not require medical attention (report to your doctor or health care professional if they  continue or are bothersome): -dizziness -hair loss -tiredness -upset stomach -vomiting This list may not describe all possible side effects. Call your doctor for medical advice about side effects. You may report side effects to FDA at 1-800-FDA-1088. Where should I keep my medicine? Keep out of the reach of children. Store at room temperature between 20 and 25 degrees C (68 and 77 degrees F). Protect from light. Throw away any unused medicine after the expiration date. NOTE: This sheet is a summary. It may not  cover all possible information. If you have questions about this medicine, talk to your doctor, pharmacist, or health care provider.  2018 Elsevier/Gold Standard (2015-01-19 05:39:22)  Prednisone tablets What is this medicine? PREDNISONE (PRED ni sone) is a corticosteroid. It is commonly used to treat inflammation of the skin, joints, lungs, and other organs. Common conditions treated include asthma, allergies, and arthritis. It is also used for other conditions, such as blood disorders and diseases of the adrenal glands. This medicine may be used for other purposes; ask your health care provider or pharmacist if you have questions. COMMON BRAND NAME(S): Deltasone, Predone, Sterapred, Sterapred DS What should I tell my health care provider before I take this medicine? They need to know if you have any of these conditions: -Cushing's syndrome -diabetes -glaucoma -heart disease -high blood pressure -infection (especially a virus infection such as chickenpox, cold sores, or herpes) -kidney disease -liver disease -mental illness -myasthenia gravis -osteoporosis -seizures -stomach or intestine problems -thyroid disease -an unusual or allergic reaction to lactose, prednisone, other medicines, foods, dyes, or preservatives -pregnant or trying to get pregnant -breast-feeding How should I use this medicine? Take this medicine by mouth with a glass of water. Follow the directions on the prescription label. Take this medicine with food. If you are taking this medicine once a day, take it in the morning. Do not take more medicine than you are told to take. Do not suddenly stop taking your medicine because you may develop a severe reaction. Your doctor will tell you how much medicine to take. If your doctor wants you to stop the medicine, the dose may be slowly lowered over time to avoid any side effects. Talk to your pediatrician regarding the use of this medicine in children. Special care may be  needed. Overdosage: If you think you have taken too much of this medicine contact a poison control center or emergency room at once. NOTE: This medicine is only for you. Do not share this medicine with others. What if I miss a dose? If you miss a dose, take it as soon as you can. If it is almost time for your next dose, talk to your doctor or health care professional. You may need to miss a dose or take an extra dose. Do not take double or extra doses without advice. What may interact with this medicine? Do not take this medicine with any of the following medications: -metyrapone -mifepristone This medicine may also interact with the following medications: -aminoglutethimide -amphotericin B -aspirin and aspirin-like medicines -barbiturates -certain medicines for diabetes, like glipizide or glyburide -cholestyramine -cholinesterase inhibitors -cyclosporine -digoxin -diuretics -ephedrine -female hormones, like estrogens and birth control pills -isoniazid -ketoconazole -NSAIDS, medicines for pain and inflammation, like ibuprofen or naproxen -phenytoin -rifampin -toxoids -vaccines -warfarin This list may not describe all possible interactions. Give your health care provider a list of all the medicines, herbs, non-prescription drugs, or dietary supplements you use. Also tell them if you smoke, drink alcohol, or use illegal  drugs. Some items may interact with your medicine. What should I watch for while using this medicine? Visit your doctor or health care professional for regular checks on your progress. If you are taking this medicine over a prolonged period, carry an identification card with your name and address, the type and dose of your medicine, and your doctor's name and address. This medicine may increase your risk of getting an infection. Tell your doctor or health care professional if you are around anyone with measles or chickenpox, or if you develop sores or blisters that do  not heal properly. If you are going to have surgery, tell your doctor or health care professional that you have taken this medicine within the last twelve months. Ask your doctor or health care professional about your diet. You may need to lower the amount of salt you eat. This medicine may affect blood sugar levels. If you have diabetes, check with your doctor or health care professional before you change your diet or the dose of your diabetic medicine. What side effects may I notice from receiving this medicine? Side effects that you should report to your doctor or health care professional as soon as possible: -allergic reactions like skin rash, itching or hives, swelling of the face, lips, or tongue -changes in emotions or moods -changes in vision -depressed mood -eye pain -fever or chills, cough, sore throat, pain or difficulty passing urine -increased thirst -swelling of ankles, feet Side effects that usually do not require medical attention (report to your doctor or health care professional if they continue or are bothersome): -confusion, excitement, restlessness -headache -nausea, vomiting -skin problems, acne, thin and shiny skin -trouble sleeping -weight gain This list may not describe all possible side effects. Call your doctor for medical advice about side effects. You may report side effects to FDA at 1-800-FDA-1088. Where should I keep my medicine? Keep out of the reach of children. Store at room temperature between 15 and 30 degrees C (59 and 86 degrees F). Protect from light. Keep container tightly closed. Throw away any unused medicine after the expiration date. NOTE: This sheet is a summary. It may not cover all possible information. If you have questions about this medicine, talk to your doctor, pharmacist, or health care provider.  2018 Elsevier/Gold Standard (2010-12-30 10:57:14)

## 2016-10-28 NOTE — Progress Notes (Signed)
Pharmacy Note  Subjective: Patient presents today to the [facility] to see Dr. Estanislado Pandy.  Patient is currently taking methotrexate 6 tablets by mouth weekly and Rheumate daily prescribed by previous rheumatologist.  Patient seen by the pharmacist for counseling on methotrexate and prednisone  Objective: CBC, CMP ordered today TB Gold: ordered today Hepatitis B surface antigen: non-reactive (06/30/16) Hepatitis C antibody: non-reactive (06/30/16) Hepatitis B core antibody: ordered today  Chest-xray:  Ordered today  Contraception: post-menopause/hysterectomy  Alcohol use: occasional   Assessment/Plan:  Patient was counseled on the purpose, proper use, and adverse effects of methotrexate including nausea, infection, and signs and symptoms of pneumonitis.  Reviewed instructions with patient to continue taking methotrexate 6 tablets weekly.  Patient reports Rheumate is expensive.  Advised patient to take folic aicd 1 mg daily instead of Rheumate.  Discussed the importance of frequent monitoring of kidney and liver function and blood counts, and provided patient with standing lab instructions.  Counseled patient to avoid NSAIDs and alcohol while on methotrexate.  Provided patient with educational materials on methotrexate and answered all questions.  Advised patient to get annual influenza vaccine and to get a pneumococcal vaccine if patient has not already had one.  Patient confirms she had a pneumonia vaccine.    Patient consented to methotrexate use.  Will upload into chart.    Patient was also given prednisone taper during today's visit.  Plan is to increase prednisone to 15 mg daily for one month, then reduce dose to 10 mg daily for one month, then continue to taper by 1 mg every month.  Counseled patient on purpose, proper use, and adverse effects of prednisone.  Advised patient to monitor blood pressure and blood sugar closely and follow up with her primary care  provider/endocrinologist closely while on prednisone.  Advised patient to take prednisone in the morning with food.  Patient voiced understanding.   Elisabeth Most, Pharm.D., BCPS Clinical Pharmacist Pager: [phone] Phone: [phone] 10/28/2016 10:45 AM

## 2016-10-29 LAB — URINALYSIS, ROUTINE W REFLEX MICROSCOPIC
Bilirubin Urine: NEGATIVE
Glucose, UA: NEGATIVE
Hgb urine dipstick: NEGATIVE
Ketones, ur: NEGATIVE
Leukocytes, UA: NEGATIVE
Nitrite: NEGATIVE
Protein, ur: NEGATIVE
Specific Gravity, Urine: 1.016 (ref 1.001–1.035)
pH: 5.5 (ref 5.0–8.0)

## 2016-10-29 LAB — URIC ACID: Uric Acid, Serum: 5.3 mg/dL (ref 2.5–7.0)

## 2016-10-31 LAB — IGG, IGA, IGM
IgA: 387 mg/dL (ref 81–463)
IgG (Immunoglobin G), Serum: 998 mg/dL (ref 694–1618)
IgM, Serum: 139 mg/dL (ref 48–271)

## 2016-11-01 LAB — QUANTIFERON TB GOLD ASSAY (BLOOD)
Mitogen-Nil: 0.08 IU/mL
Quantiferon Nil Value: 0.04 IU/mL
Quantiferon Tb Ag Minus Nil Value: <0 IU/mL

## 2016-11-01 NOTE — Progress Notes (Signed)
Will need repeat TB gold

## 2016-11-03 DIAGNOSIS — H35373 Puckering of macula, bilateral: Secondary | ICD-10-CM | POA: Diagnosis not present

## 2016-11-03 DIAGNOSIS — E119 Type 2 diabetes mellitus without complications: Secondary | ICD-10-CM | POA: Diagnosis not present

## 2016-11-03 DIAGNOSIS — H524 Presbyopia: Secondary | ICD-10-CM | POA: Diagnosis not present

## 2016-11-07 ENCOUNTER — Other Ambulatory Visit: Payer: Self-pay | Admitting: Rheumatology

## 2016-11-07 ENCOUNTER — Ambulatory Visit (HOSPITAL_COMMUNITY)
Admission: RE | Admit: 2016-11-07 | Discharge: 2016-11-07 | Disposition: A | Discharge status: Home / Self Care | Payer: Medicare Other | Source: Ambulatory Visit | Attending: Rheumatology | Admitting: Rheumatology

## 2016-11-07 ENCOUNTER — Other Ambulatory Visit: Payer: Self-pay | Admitting: Radiology

## 2016-11-07 DIAGNOSIS — Z0389 Encounter for observation for other suspected diseases and conditions ruled out: Secondary | ICD-10-CM | POA: Diagnosis not present

## 2016-11-07 DIAGNOSIS — E1122 Type 2 diabetes mellitus with diabetic chronic kidney disease: Secondary | ICD-10-CM | POA: Diagnosis not present

## 2016-11-07 DIAGNOSIS — Z79899 Other long term (current) drug therapy: Secondary | ICD-10-CM

## 2016-11-07 DIAGNOSIS — I2510 Atherosclerotic heart disease of native coronary artery without angina pectoris: Secondary | ICD-10-CM | POA: Diagnosis not present

## 2016-11-07 DIAGNOSIS — M069 Rheumatoid arthritis, unspecified: Secondary | ICD-10-CM | POA: Diagnosis not present

## 2016-11-07 DIAGNOSIS — N183 Chronic kidney disease, stage 3 (moderate): Secondary | ICD-10-CM | POA: Diagnosis not present

## 2016-11-07 DIAGNOSIS — I25119 Atherosclerotic heart disease of native coronary artery with unspecified angina pectoris: Secondary | ICD-10-CM | POA: Diagnosis not present

## 2016-11-07 DIAGNOSIS — I10 Essential (primary) hypertension: Secondary | ICD-10-CM | POA: Diagnosis not present

## 2016-11-07 DIAGNOSIS — M5416 Radiculopathy, lumbar region: Secondary | ICD-10-CM | POA: Diagnosis not present

## 2016-11-07 DIAGNOSIS — E782 Mixed hyperlipidemia: Secondary | ICD-10-CM | POA: Diagnosis not present

## 2016-11-07 DIAGNOSIS — M109 Gout, unspecified: Secondary | ICD-10-CM | POA: Diagnosis not present

## 2016-11-07 DIAGNOSIS — K219 Gastro-esophageal reflux disease without esophagitis: Secondary | ICD-10-CM | POA: Diagnosis not present

## 2016-11-07 DIAGNOSIS — I252 Old myocardial infarction: Secondary | ICD-10-CM | POA: Diagnosis not present

## 2016-11-07 DIAGNOSIS — M353 Polymyalgia rheumatica: Secondary | ICD-10-CM | POA: Diagnosis not present

## 2016-11-07 LAB — CBC WITH DIFFERENTIAL/PLATELET
Basophils Absolute: 0 cells/uL (ref 0–200)
Basophils Relative: 0 %
Eosinophils Absolute: 193 cells/uL (ref 15–500)
Eosinophils Relative: 1 %
HCT: 40.8 % (ref 35.0–45.0)
Hemoglobin: 13.4 g/dL (ref 11.7–15.5)
Lymphocytes Relative: 11 %
Lymphs Abs: 2123 cells/uL (ref 850–3900)
MCH: 30.7 pg (ref 27.0–33.0)
MCHC: 32.8 g/dL (ref 32.0–36.0)
MCV: 93.4 fL (ref 80.0–100.0)
MPV: 9.8 fL (ref 7.5–12.5)
Monocytes Absolute: 1158 cells/uL — ABNORMAL HIGH (ref 200–950)
Monocytes Relative: 6 %
Neutro Abs: 15826 cells/uL — ABNORMAL HIGH (ref 1500–7800)
Neutrophils Relative %: 82 %
Platelets: 363 10*3/uL (ref 140–400)
RBC: 4.37 MIL/uL (ref 3.80–5.10)
RDW: 13.8 % (ref 11.0–15.0)
WBC: 19.3 10*3/uL — ABNORMAL HIGH (ref 3.8–10.8)

## 2016-11-07 MED ORDER — METHOTREXATE 2.5 MG PO TABS: 15.0000 mg | ORAL_TABLET | ORAL | 0 refills | Status: DC

## 2016-11-07 NOTE — Telephone Encounter (Signed)
Last Visit: 10/28/16 Next Visit: 11/29/16 Labs: 10/28/16 elevated glucose  Okay to refill MTX?

## 2016-11-07 NOTE — Telephone Encounter (Signed)
Patient is requesting a refill on her MTX.  She uses [facility] at [facility] or she thinks it [facility] be [address].  CB#[phone].  Thank you.

## 2016-11-08 LAB — COMPLETE METABOLIC PANEL WITH GFR
ALT: 10 U/L (ref 6–29)
AST: 12 U/L (ref 10–35)
Albumin: 4.2 g/dL (ref 3.6–5.1)
Alkaline Phosphatase: 37 U/L (ref 33–130)
BUN: 18 mg/dL (ref 7–25)
CO2: 25 mmol/L (ref 20–31)
Calcium: 9.9 mg/dL (ref 8.6–10.4)
Chloride: 98 mmol/L (ref 98–110)
Creat: 1.02 mg/dL — ABNORMAL HIGH (ref 0.60–0.88)
GFR, Est African American: 60 mL/min (ref >=60)
GFR, Est Non African American: 52 mL/min — ABNORMAL LOW (ref >=60)
Glucose, Bld: 127 mg/dL — ABNORMAL HIGH (ref 65–99)
Potassium: 4.2 mmol/L (ref 3.5–5.3)
Sodium: 139 mmol/L (ref 135–146)
Total Bilirubin: 0.5 mg/dL (ref 0.2–1.2)
Total Protein: 7.6 g/dL (ref 6.1–8.1)

## 2016-11-08 NOTE — Progress Notes (Signed)
Will monitor

## 2016-11-08 NOTE — Progress Notes (Signed)
Normal

## 2016-11-21 DIAGNOSIS — L720 Epidermal cyst: Secondary | ICD-10-CM | POA: Diagnosis not present

## 2016-11-21 DIAGNOSIS — Z85828 Personal history of other malignant neoplasm of skin: Secondary | ICD-10-CM | POA: Diagnosis not present

## 2016-11-21 DIAGNOSIS — D692 Other nonthrombocytopenic purpura: Secondary | ICD-10-CM | POA: Diagnosis not present

## 2016-11-21 DIAGNOSIS — L570 Actinic keratosis: Secondary | ICD-10-CM | POA: Diagnosis not present

## 2016-11-21 DIAGNOSIS — L812 Freckles: Secondary | ICD-10-CM | POA: Diagnosis not present

## 2016-11-21 DIAGNOSIS — D485 Neoplasm of uncertain behavior of skin: Secondary | ICD-10-CM | POA: Diagnosis not present

## 2016-11-21 DIAGNOSIS — L821 Other seborrheic keratosis: Secondary | ICD-10-CM | POA: Diagnosis not present

## 2016-11-21 DIAGNOSIS — D1801 Hemangioma of skin and subcutaneous tissue: Secondary | ICD-10-CM | POA: Diagnosis not present

## 2016-11-25 DIAGNOSIS — M8589 Other specified disorders of bone density and structure, multiple sites: Secondary | ICD-10-CM | POA: Insufficient documentation

## 2016-11-25 DIAGNOSIS — M353 Polymyalgia rheumatica: Secondary | ICD-10-CM | POA: Insufficient documentation

## 2016-11-25 DIAGNOSIS — M19041 Primary osteoarthritis, right hand: Secondary | ICD-10-CM | POA: Insufficient documentation

## 2016-11-25 DIAGNOSIS — M19042 Primary osteoarthritis, left hand: Secondary | ICD-10-CM

## 2016-11-25 DIAGNOSIS — M170 Bilateral primary osteoarthritis of knee: Secondary | ICD-10-CM | POA: Insufficient documentation

## 2016-11-25 NOTE — Progress Notes (Signed)
Office Visit Note  Patient: Sophia Ward             Date of Birth: 08/07/1932           MRN: [id number]             PCP: [name], MD Referring: [name], MD Visit Date: 11/29/2016 Occupation: @GUAROCC @    Subjective:  Right hip pain   History of Present Illness: Sophia Ward is a 83 y.o. female with history of polymyalgia rheumatica and rheumatoid arthritis. She's been on tapering dose of prednisone. She took 50 mg of prednisone last month and now she is down to 10 mg by mouth daily since yesterday. She denies any joint pain or joint swelling. She's been tolerating methotrexate well. She's been having some discomfort in her right gluteal area.  Activities of Daily Living:  Patient reports morning stiffness for 30 rminutes.   Patient Denies nocturnal pain.  Difficulty dressing/grooming: Denies Difficulty climbing stairs: Denies Difficulty getting out of chair: Denies Difficulty using hands for taps, buttons, cutlery, and/or writing: Denies   Review of Systems  Constitutional: Negative.  Negative for fatigue.  HENT: Positive for mouth sores. Negative for mouth dryness.   Eyes: Positive for dryness.  Respiratory: Negative.  Negative for cough, shortness of breath and difficulty breathing.   Cardiovascular: Negative.  Negative for palpitations, hypertension and irregular heartbeat.  Gastrointestinal: Negative.  Negative for blood in stool, constipation and diarrhea.  Endocrine: Negative.   Genitourinary: Negative.  Negative for nocturia.  Musculoskeletal: Positive for arthralgias and joint pain. Negative for joint swelling, myalgias, muscle weakness, morning stiffness and myalgias.  Skin: Negative.  Negative for color change, rash, hair loss, ulcers and sensitivity to sunlight.  Neurological: Negative.  Negative for headaches.  Hematological: Negative.  Negative for swollen glands.  Psychiatric/Behavioral: Negative.  Negative for depressed mood and  sleep disturbance. The patient is not nervous/anxious.     PMFS History:  Patient Active Problem List   Diagnosis Date Noted  . Polymyalgia rheumatica ([place]) 11/25/2016  . Primary osteoarthritis of both hands 11/25/2016  . Bilateral primary osteoarthritis of knee 11/25/2016  . Osteopenia of multiple sites 11/25/2016  . 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 Osteoarthritis of lumbar spine 10/28/2016  . History of diabetes mellitus 10/27/2016  . History of gastroesophageal reflux (GERD) 10/27/2016  . Rheumatoid arthritis involving multiple sites with positive rheumatoid factor ([place]) 10/14/2016  . Elevated C-reactive protein (CRP) 10/14/2016  . Elevated sed rate 10/14/2016  . High risk medication use 10/14/2016  . Coronary atherosclerosis of native coronary artery 01/30/2014  . Mixed hyperlipidemia 01/30/2014  . Essential hypertension, benign 01/30/2014  . Obesity, unspecified 01/30/2014    Past Medical History:  Diagnosis Date  . Allergic rhinitis   . Anemia   . Arthritis    hands  . BPPV (benign paroxysmal positional vertigo)   . Coronary artery disease   . Coronary atherosclerosis of native coronary artery 01/30/2014  . Diabetes mellitus without complication ([place])   . Essential hypertension, benign 01/30/2014  . GERD (gastroesophageal reflux disease)   . Hypertension   . Mixed hyperlipidemia 01/30/2014  . Myocardial infarction ([place]) 2004   mild MI-no damage- had stents  . Osteopenia   . Post-menopausal   . Stenosing tenosynovitis     Family History  Problem Relation Age of Onset  . CAD Mother   . Heart attack Mother   . CAD Father    Past Surgical History:  Procedure Laterality Date  . ABDOMINAL HYSTERECTOMY    . APPENDECTOMY    . CARDIAC CATHETERIZATION  2004  . carpel tunnel    . COLONOSCOPY WITH PROPOFOL N/A 10/30/2012   Procedure: COLONOSCOPY WITH PROPOFOL;  Surgeon: Garlan Fair, MD;  Location: [facility];  Service: Endoscopy;  Laterality:  N/A;  . CORONARY ANGIOPLASTY  2004  . DILATION AND CURETTAGE OF UTERUS    . ESOPHAGOGASTRODUODENOSCOPY (EGD) WITH PROPOFOL N/A 10/30/2012   Procedure: ESOPHAGOGASTRODUODENOSCOPY (EGD) WITH PROPOFOL;  Surgeon: Garlan Fair, MD;  Location: [facility];  Service: Endoscopy;  Laterality: N/A;  . EYE SURGERY     bilateral cataract with lens implant  . left shouler surgery    . TONSILLECTOMY     Social History   Social History Narrative  . No narrative on file     Objective: Vital Signs: BP (!) 142/49   Pulse (!) 49   Resp 14   Ht 5\' 3"  (1.6 m)   Wt 179 lb (81.2 kg)   BMI 31.71 kg/m    Physical Exam  Constitutional: She is oriented to person, place, and time. She appears well-developed and well-nourished.  HENT:  Head: Normocephalic and atraumatic.  Eyes: Conjunctivae and EOM are normal.  Neck: Normal range of motion.  Cardiovascular: Normal rate, regular rhythm, normal heart sounds and intact distal pulses.   Pulmonary/Chest: Effort normal and breath sounds normal.  Abdominal: Soft. Bowel sounds are normal.  Lymphadenopathy:    She has no cervical adenopathy.  Neurological: She is alert and oriented to person, place, and time.  Skin: Skin is warm and dry. Capillary refill takes less than 2 seconds.  Psychiatric: She has a normal mood and affect. Her behavior is normal.  Nursing note and vitals reviewed.    Musculoskeletal Exam: C-spine and thoracic lumbar spine good range of motion. She does have thoracic kyphosis. Shoulder joints although joints wrist joints with good range of motion. She has no MCP or PIP swelling today on examination. She does have DIP PIP thickening in her hands and feet consistent with osteoarthritis. Hip joints knee joints ankles MTPs PIPs DIPs are good range of motion.  CDAI Exam: CDAI Homunculus Exam:   Joint Counts:  CDAI Tender Joint count: 0 CDAI Swollen Joint count: 0  Global Assessments:  Patient Global Assessment: 1 Provider Global  Assessment: 1  CDAI Calculated Score: 2    Investigation: Findings:  10/28/2016 Quantiferon tb gold assay indeterminate, Sedimentation rate elevated ,Hepatitis B core antibody negative ,IgG, IgA, IgM normal,Uric acid normal, and Urinalysis, normal /   CBC Latest Ref Rng & Units 11/07/2016 10/28/2016 10/06/2008  WBC 3.8 - 10.8 K/uL 19.3(H) 18.4(H) 12.3(H)  Hemoglobin 11.7 - 15.5 g/dL 13.4 13.2 14.5  Hematocrit 35.0 - 45.0 % 40.8 41.0 42.8  Platelets 140 - 400 K/uL 363 417(H) 297   CMP Latest Ref Rng & Units 11/07/2016 10/28/2016 10/06/2008  Glucose 65 - 99 mg/dL 127(H) 163(H) 132(H)  BUN 7 - 25 mg/dL 18 18 12   Creatinine 0.60 - 0.88 mg/dL 1.02(H) 0.86 0.81  Sodium 135 - 146 mmol/L 139 139 137  Potassium 3.5 - 5.3 mmol/L 4.2 3.9 3.9  Chloride 98 - 110 mmol/L 98 102 99  CO2 20 - 31 mmol/L 25 24 30   Calcium 8.6 - 10.4 mg/dL 9.9 9.8 9.6  Total Protein 6.1 - 8.1 g/dL 7.6 7.3 -  Total Bilirubin 0.2 - 1.2 mg/dL 0.5 0.5 -  Alkaline Phos 33 - 130 U/L 37 38 -  AST 10 - 35 U/L 12 10 -  ALT 6 - 29 U/L 10 9 -    Imaging: Dg Chest 2 View  Result Date: 11/07/2016 CLINICAL DATA:  Screening for immunosuppressive therapy EXAM: CHEST  2 VIEW COMPARISON:  05/04/2011 FINDINGS: The heart size and mediastinal contours are within normal limits. Both lungs are clear. The visualized skeletal structures are unremarkable. IMPRESSION: No active cardiopulmonary disease. Electronically Signed   By: Kathreen Devoid   On: 11/07/2016 13:55    Speciality Comments: No specialty comments available.    Procedures:  No procedures performed Allergies: Codeine and Penicillins   Assessment / Plan:     Visit Diagnoses: Polymyalgia rheumatica ([place]): She is clinically doing well without any muscle weakness or tenderness on examination today. She is on tapering dose of prednisone. Currently taking 10 mg by mouth daily.. Detailed discussion about her prednisone. I would like to taper her prednisone by 1 mg every month. We will  give her 5 mg and 1 mg tablets.  Rheumatoid arthritis involving multiple sites with positive rheumatoid factor ([facility]) - RF 309, ANA negative, CRP 5.8, synovitis, treated by Dr. Philbert Riser in [place]. She currently have no synovitis  High risk medication use - Methotrexate 6 tablets po qweek, Folic acid 1 mg po qd and Prednisone 10 mg po qd  Elevated sed rate  Primary osteoarthritis of both hands: She is a stiffness and discomfort  Bilateral primary osteoarthritis of knee: Chronic pain  Primary osteoarthritis of both feet: Proper fitting shoes were discussed.  Osteoarthritis of lumbar spine, unspecified spinal osteoarthritis complication status: Chronic pain  Osteopenia of multiple sites: She she has history of osteopenia. As she will be on long-term prednisone we will start her on Fosamax. Fosamax 70 mg by mouth every week prescription was given. Indications side effects contraindications were discussed.  History of diabetes mellitus: She will be seeing Dr. Buddy Duty and will need close monitoring of her blood sugar.  History of gastroesophageal reflux (GERD)  History of hyperlipidemia  History of hypertension    Orders: Orders Placed This Encounter  Procedures  . Sedimentation rate  . Quantiferon tb gold assay (blood)   Meds ordered this encounter  Medications  . predniSONE (DELTASONE) 5 MG tablet    Sig: Take 1 tablet (5 mg total) by mouth daily with breakfast.    Dispense:  90 tablet    Refill:  1  . predniSONE (DELTASONE) 1 MG tablet    Sig: Take with 5 mg tablets as directed with taper schedule.    Dispense:  300 tablet    Refill:  0  . alendronate (FOSAMAX) 70 MG tablet    Sig: Take 1 tablet (70 mg total) by mouth once a week. Take with a full glass of water on an empty stomach.    Dispense:  12 tablet    Refill:  1    Face-to-face time spent with patient was 30 minutes. 50% of time was spent in counseling and coordination of care.  Follow-Up Instructions:  Return in about 3 months (around 03/01/2017) for PMR RA OA .   Bo Merino, MD  Note - This record has been created using Editor, commissioning.  Chart creation errors have been sought, but may not always  have been located. Such creation errors do not reflect on  the standard of medical care.

## 2016-11-29 ENCOUNTER — Encounter: Payer: Self-pay | Admitting: Rheumatology

## 2016-11-29 ENCOUNTER — Ambulatory Visit (INDEPENDENT_AMBULATORY_CARE_PROVIDER_SITE_OTHER): Payer: Medicare Other | Admitting: Rheumatology

## 2016-11-29 VITALS — BP 142/49 | HR 49 | Resp 14 | Ht 63.0 in | Wt 179.0 lb

## 2016-11-29 DIAGNOSIS — Z8639 Personal history of other endocrine, nutritional and metabolic disease: Secondary | ICD-10-CM | POA: Diagnosis not present

## 2016-11-29 DIAGNOSIS — M19041 Primary osteoarthritis, right hand: Secondary | ICD-10-CM | POA: Diagnosis not present

## 2016-11-29 DIAGNOSIS — Z8719 Personal history of other diseases of the digestive system: Secondary | ICD-10-CM

## 2016-11-29 DIAGNOSIS — R700 Elevated erythrocyte sedimentation rate: Secondary | ICD-10-CM

## 2016-11-29 DIAGNOSIS — M19042 Primary osteoarthritis, left hand: Secondary | ICD-10-CM

## 2016-11-29 DIAGNOSIS — Z79899 Other long term (current) drug therapy: Secondary | ICD-10-CM | POA: Diagnosis not present

## 2016-11-29 DIAGNOSIS — M19071 Primary osteoarthritis, right ankle and foot: Secondary | ICD-10-CM

## 2016-11-29 DIAGNOSIS — M170 Bilateral primary osteoarthritis of knee: Secondary | ICD-10-CM

## 2016-11-29 DIAGNOSIS — M353 Polymyalgia rheumatica: Secondary | ICD-10-CM

## 2016-11-29 DIAGNOSIS — M8589 Other specified disorders of bone density and structure, multiple sites: Secondary | ICD-10-CM | POA: Diagnosis not present

## 2016-11-29 DIAGNOSIS — I2510 Atherosclerotic heart disease of native coronary artery without angina pectoris: Secondary | ICD-10-CM

## 2016-11-29 DIAGNOSIS — Z9225 Personal history of immunosupression therapy: Secondary | ICD-10-CM | POA: Diagnosis not present

## 2016-11-29 DIAGNOSIS — M0579 Rheumatoid arthritis with rheumatoid factor of multiple sites without organ or systems involvement: Secondary | ICD-10-CM

## 2016-11-29 DIAGNOSIS — M19072 Primary osteoarthritis, left ankle and foot: Secondary | ICD-10-CM

## 2016-11-29 DIAGNOSIS — M47816 Spondylosis without myelopathy or radiculopathy, lumbar region: Secondary | ICD-10-CM

## 2016-11-29 DIAGNOSIS — Z8679 Personal history of other diseases of the circulatory system: Secondary | ICD-10-CM

## 2016-11-29 MED ORDER — PREDNISONE 5 MG PO TABS: 5.0000 mg | ORAL_TABLET | Freq: Every day | ORAL | 1 refills | Status: DC

## 2016-11-29 MED ORDER — PREDNISONE 1 MG PO TABS: ORAL_TABLET | ORAL | 0 refills | Status: DC

## 2016-11-29 MED ORDER — ALENDRONATE SODIUM 70 MG PO TABS: 70.0000 mg | ORAL_TABLET | ORAL | 1 refills | Status: DC

## 2016-11-29 NOTE — Patient Instructions (Addendum)
Alendronate tablets What is this medicine? ALENDRONATE (a LEN droe nate) slows calcium loss from bones. It helps to make normal healthy bone and to slow bone loss in people with Paget's disease and osteoporosis. It may be used in others at risk for bone loss. This medicine may be used for other purposes; ask your health care provider or pharmacist if you have questions. COMMON BRAND NAME(S): Fosamax What should I tell my health care provider before I take this medicine? They need to know if you have any of these conditions: -dental disease -esophagus, stomach, or intestine problems, like acid reflux or GERD -kidney disease -low blood calcium -low vitamin D -problems sitting or standing 30 minutes -trouble swallowing -an unusual or allergic reaction to alendronate, other medicines, foods, dyes, or preservatives -pregnant or trying to get pregnant -breast-feeding How should I use this medicine? You must take this medicine exactly as directed or you will lower the amount of the medicine you absorb into your body or you may cause yourself harm. Take this medicine by mouth first thing in the morning, after you are up for the day. Do not eat or drink anything before you take your medicine. Swallow the tablet with a full glass (6 to 8 fluid ounces) of plain water. Do not take this medicine with any other drink. Do not chew or crush the tablet. After taking this medicine, do not eat breakfast, drink, or take any medicines or vitamins for at least 30 minutes. Sit or stand up for at least 30 minutes after you take this medicine; do not lie down. Do not take your medicine more often than directed. Talk to your pediatrician regarding the use of this medicine in children. Special care may be needed. Overdosage: If you think you have taken too much of this medicine contact a poison control center or emergency room at once. NOTE: This medicine is only for you. Do not share this medicine with others. What if I  miss a dose? If you miss a dose, do not take it later in the day. Continue your normal schedule starting the next morning. Do not take double or extra doses. What may interact with this medicine? -aluminum hydroxide -antacids -aspirin -calcium supplements -drugs for inflammation like ibuprofen, naproxen, and others -iron supplements -magnesium supplements -vitamins with minerals This list may not describe all possible interactions. Give your health care provider a list of all the medicines, herbs, non-prescription drugs, or dietary supplements you use. Also tell them if you smoke, drink alcohol, or use illegal drugs. Some items may interact with your medicine. What should I watch for while using this medicine? Visit your doctor or health care professional for regular checks ups. It may be some time before you see benefit from this medicine. Do not stop taking your medicine except on your doctor's advice. Your doctor or health care professional may order blood tests and other tests to see how you are doing. You should make sure you get enough calcium and vitamin D while you are taking this medicine, unless your doctor tells you not to. Discuss the foods you eat and the vitamins you take with your health care professional. Some people who take this medicine have severe bone, joint, and/or muscle pain. This medicine may also increase your risk for a broken thigh bone. Tell your doctor right away if you have pain in your upper leg or groin. Tell your doctor if you have any pain that does not go away or that gets worse.  This medicine can make you more sensitive to the sun. If you get a rash while taking this medicine, sunlight may cause the rash to get worse. Keep out of the sun. If you cannot avoid being in the sun, wear protective clothing and use sunscreen. Do not use sun lamps or tanning beds/booths. What side effects may I notice from receiving this medicine? Side effects that you should report to  your doctor or health care professional as soon as possible: -allergic reactions like skin rash, itching or hives, swelling of the face, lips, or tongue -black or tarry stools -bone, muscle or joint pain -changes in vision -chest pain -heartburn or stomach pain -jaw pain, especially after dental work -pain or trouble when swallowing -redness, blistering, peeling or loosening of the skin, including inside the mouth Side effects that usually do not require medical attention (report to your doctor or health care professional if they continue or are bothersome): -changes in taste -diarrhea or constipation -eye pain or itching -headache -nausea or vomiting -stomach gas or fullness This list may not describe all possible side effects. Call your doctor for medical advice about side effects. You may report side effects to FDA at 1-800-FDA-1088. Where should I keep my medicine? Keep out of the reach of children. Store at room temperature of 15 and 30 degrees C (59 and 86 degrees F). Throw away any unused medicine after the expiration date. NOTE: This sheet is a summary. It may not cover all possible information. If you have questions about this medicine, talk to your doctor, pharmacist, or health care provider.  2018 Elsevier/Gold Standard (2010-11-12 08:56:09)      Prednisone Schedule Prednisone 9 mg for 1 month  Prednisone 8 mg for 1 month  Prednisone 7 mg for 1 month Prednisone 6 mg for 1 month Prednisone 5 mg for 1 month then stay on it  Standing Labs We placed an order today for your standing lab work.    Please come back and get your standing labs in September and every 3 months  We have open lab Monday through Friday from 8:30-11:30 AM and 1:30-4 PM at the office of Dr. Bo Merino.   The office is located at [address], [place], [place], [place] [address] No appointment is necessary.   Labs are drawn by [facility].  You may receive a bill from [facility] for your lab  work. If you have any questions regarding directions or hours of operation,  please call [phone].

## 2016-11-30 LAB — SEDIMENTATION RATE: Sed Rate: 9 mm/hr (ref 0–30)

## 2016-12-02 LAB — QUANTIFERON TB GOLD ASSAY (BLOOD)
Interferon Gamma Release Assay: NEGATIVE
Mitogen-Nil: 2.87 IU/mL
Quantiferon Nil Value: 0.02 IU/mL
Quantiferon Tb Ag Minus Nil Value: 0 IU/mL

## 2016-12-02 NOTE — Progress Notes (Signed)
wnl

## 2016-12-09 DIAGNOSIS — M2550 Pain in unspecified joint: Secondary | ICD-10-CM | POA: Diagnosis not present

## 2016-12-09 DIAGNOSIS — E119 Type 2 diabetes mellitus without complications: Secondary | ICD-10-CM | POA: Diagnosis not present

## 2016-12-09 DIAGNOSIS — Z794 Long term (current) use of insulin: Secondary | ICD-10-CM | POA: Diagnosis not present

## 2017-01-07 DIAGNOSIS — M545 Low back pain: Secondary | ICD-10-CM | POA: Diagnosis not present

## 2017-01-09 ENCOUNTER — Telehealth: Payer: Self-pay | Admitting: Rheumatology

## 2017-01-09 DIAGNOSIS — M533 Sacrococcygeal disorders, not elsewhere classified: Secondary | ICD-10-CM | POA: Diagnosis not present

## 2017-01-09 NOTE — Telephone Encounter (Signed)
Patient requesting a refill on Prednisone. Patient uses Paediatric nurse in [place] on [address].

## 2017-01-10 NOTE — Telephone Encounter (Signed)
Patient advised prescription was sent to pharmacy on 11/29/16. Patient will check with pharmacy. Patient to call tback if she has any problems.

## 2017-01-11 ENCOUNTER — Telehealth: Payer: Self-pay | Admitting: Radiology

## 2017-01-11 NOTE — Telephone Encounter (Signed)
Called patient to discuss she states she did get the Rx we sent in for her on July 3rd, she picked up today

## 2017-01-11 NOTE — Telephone Encounter (Signed)
Received refill request for prednisone taper, but patient is now tapering prednisone. This may be sent by the pharmacy in error will contact patient to see if she has prednisone, and where she is in her taper.

## 2017-01-17 DIAGNOSIS — M533 Sacrococcygeal disorders, not elsewhere classified: Secondary | ICD-10-CM | POA: Diagnosis not present

## 2017-01-31 ENCOUNTER — Other Ambulatory Visit: Payer: Self-pay | Admitting: Rheumatology

## 2017-01-31 DIAGNOSIS — R350 Frequency of micturition: Secondary | ICD-10-CM | POA: Diagnosis not present

## 2017-01-31 NOTE — Telephone Encounter (Signed)
Last Visit: 11/29/16 Next Visit: 02/22/17 Labs: 11/07/16 Creat. 1.02 GFR 52 Previously normal WBC 19.3 Previously 18.4  Okay to refill MTX?

## 2017-01-31 NOTE — Telephone Encounter (Signed)
Reduce methotrexate to 5 tablets per week

## 2017-02-06 DIAGNOSIS — M545 Low back pain: Secondary | ICD-10-CM | POA: Diagnosis not present

## 2017-02-10 DIAGNOSIS — M545 Low back pain: Secondary | ICD-10-CM | POA: Diagnosis not present

## 2017-02-10 NOTE — Progress Notes (Signed)
Office Visit Note  Patient: Sophia Ward             Date of Birth: [birth year]           MRN: [id number]             PCP: [name], MD Referring: [name], MD Visit Date: 02/22/2017 Occupation: @GUAROCC @    Subjective:  Lower back pain.   History of Present Illness: Sophia Ward is a 83 y.o. female with history of polymyalgia, rheumatoid arthritis and osteoarthritis. She states she has not had any joint pain or joint swelling in her hands or feet. She denies any muscle weakness or tenderness in the proximal muscles. She's been tapering her prednisone and is on 8 mg by mouth daily currently. She has reduced her methotrexate to 5 tablets by mouth every week. She continues to have lower back pain for which she was seen by Dr. Rhona Raider and Dr. Mina Marble. She had some cortisone injections to her lower back without much relief. She's been going to physical therapy she has not noticed much improvement so far. She'll be following up with Dr. Rhona Raider regarding this.  Activities of Daily Living:  Patient reports morning stiffness for 2 hours.   Patient Denies nocturnal pain.  Difficulty dressing/grooming: Denies Difficulty climbing stairs: Reports Difficulty getting out of chair: Reports Difficulty using hands for taps, buttons, cutlery, and/or writing: Denies   Review of Systems  Constitutional: Positive for fatigue. Negative for night sweats, weight gain, weight loss and weakness.  HENT: Positive for mouth dryness. Negative for mouth sores, trouble swallowing, trouble swallowing and nose dryness.   Eyes: Negative for pain, redness, visual disturbance and dryness.  Respiratory: Negative for cough, shortness of breath and difficulty breathing.   Cardiovascular: Negative for chest pain, palpitations, hypertension, irregular heartbeat and swelling in legs/feet.  Gastrointestinal: Negative for blood in stool, constipation and diarrhea.  Endocrine: Negative for increased  urination.  Genitourinary: Negative for vaginal dryness.  Musculoskeletal: Positive for arthralgias, joint pain and morning stiffness. Negative for joint swelling, myalgias, muscle weakness, muscle tenderness and myalgias.  Skin: Negative for color change, rash, hair loss, skin tightness, ulcers and sensitivity to sunlight.  Allergic/Immunologic: Negative for susceptible to infections.  Neurological: Negative for dizziness, memory loss and night sweats.  Hematological: Negative for swollen glands.  Psychiatric/Behavioral: Positive for depressed mood. Negative for sleep disturbance. The patient is not nervous/anxious.     PMFS History:  Patient Active Problem List   Diagnosis Date Noted  . Polymyalgia rheumatica ([place]) 11/25/2016  . Primary osteoarthritis of both hands 11/25/2016  . Bilateral primary osteoarthritis of knee 11/25/2016  . Osteopenia of multiple sites 11/25/2016  . 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 Osteoarthritis of lumbar spine 10/28/2016  . History of diabetes mellitus 10/27/2016  . History of gastroesophageal reflux (GERD) 10/27/2016  . Rheumatoid arthritis involving multiple sites with positive rheumatoid factor ([place]) 10/14/2016  . Elevated C-reactive protein (CRP) 10/14/2016  . Elevated sed rate 10/14/2016  . High risk medication use 10/14/2016  . Coronary atherosclerosis of native coronary artery 01/30/2014  . Mixed hyperlipidemia 01/30/2014  . Essential hypertension, benign 01/30/2014  . Obesity, unspecified 01/30/2014    Past Medical History:  Diagnosis Date  . Allergic rhinitis   . Anemia   . Arthritis    hands  . BPPV (benign paroxysmal positional vertigo)   . Coronary artery disease   . Coronary atherosclerosis of native coronary artery 01/30/2014  . Diabetes mellitus  without complication ([place])   . Essential hypertension, benign 01/30/2014  . GERD (gastroesophageal reflux disease)   . Hypertension   . Mixed hyperlipidemia 01/30/2014    . Myocardial infarction ([place]) 2004   mild MI-no damage- had stents  . Osteopenia   . Post-menopausal   . Stenosing tenosynovitis     Family History  Problem Relation Age of Onset  . CAD Mother   . Heart attack Mother   . CAD Father    Past Surgical History:  Procedure Laterality Date  . ABDOMINAL HYSTERECTOMY    . APPENDECTOMY    . CARDIAC CATHETERIZATION  2004  . carpel tunnel    . COLONOSCOPY WITH PROPOFOL N/A 10/30/2012   Procedure: COLONOSCOPY WITH PROPOFOL;  Surgeon: Garlan Fair, MD;  Location: [facility];  Service: Endoscopy;  Laterality: N/A;  . CORONARY ANGIOPLASTY  2004  . DILATION AND CURETTAGE OF UTERUS    . ESOPHAGOGASTRODUODENOSCOPY (EGD) WITH PROPOFOL N/A 10/30/2012   Procedure: ESOPHAGOGASTRODUODENOSCOPY (EGD) WITH PROPOFOL;  Surgeon: Garlan Fair, MD;  Location: [facility];  Service: Endoscopy;  Laterality: N/A;  . EYE SURGERY     bilateral cataract with lens implant  . left shouler surgery    . TONSILLECTOMY     Social History   Social History Narrative  . No narrative on file     Objective: Vital Signs: BP 137/62 (BP Location: Left Arm, Patient Position: Sitting, Cuff Size: Large)   Pulse 63   Resp 12   Ht 5\' 2"  (1.575 m)   Wt 181 lb (82.1 kg)   BMI 33.11 kg/m    Physical Exam  Constitutional: She is oriented to person, place, and time. She appears well-developed and well-nourished.  HENT:  Head: Normocephalic and atraumatic.  Eyes: Conjunctivae and EOM are normal.  Neck: Normal range of motion.  Cardiovascular: Normal rate, regular rhythm, normal heart sounds and intact distal pulses.   Pulmonary/Chest: Effort normal and breath sounds normal.  Abdominal: Soft. Bowel sounds are normal.  Lymphadenopathy:    She has no cervical adenopathy.  Neurological: She is alert and oriented to person, place, and time.  Skin: Skin is warm and dry. Capillary refill takes less than 2 seconds.  Psychiatric: She has a normal mood and affect. Her  behavior is normal.  Nursing note and vitals reviewed.    Musculoskeletal Exam: C-spine and thoracic lumbar spine good range of motion. Shoulder joints although joints wrist joints are good range of motion. MCPs PIPs DIPs with good range of motion she has some DIP PIP thickening. No synovitis over MCPs and wrists was noted. Hip joints knee joints ankles MTPs with good range of motion with no synovitis. She had no proximal muscle weakness or tenderness.  CDAI Exam: CDAI Homunculus Exam:   Joint Counts:  CDAI Tender Joint count: 0 CDAI Swollen Joint count: 0  Global Assessments:  Patient Global Assessment: 1 Provider Global Assessment: 1  CDAI Calculated Score: 2    Investigation: No additional findings.TB Gold: Negative in 11/2016 CBC Latest Ref Rng & Units 11/07/2016 10/28/2016 10/06/2008  WBC 3.8 - 10.8 K/uL 19.3(H) 18.4(H) 12.3(H)  Hemoglobin 11.7 - 15.5 g/dL 13.4 13.2 14.5  Hematocrit 35.0 - 45.0 % 40.8 41.0 42.8  Platelets 140 - 400 K/uL 363 417(H) 297   CMP Latest Ref Rng & Units 11/07/2016 10/28/2016 10/06/2008  Glucose 65 - 99 mg/dL 127(H) 163(H) 132(H)  BUN 7 - 25 mg/dL 18 18 12   Creatinine 0.60 - 0.88 mg/dL 1.02(H) 0.86 0.81  Sodium 135 - 146 mmol/L 139 139 137  Potassium 3.5 - 5.3 mmol/L 4.2 3.9 3.9  Chloride 98 - 110 mmol/L 98 102 99  CO2 20 - 31 mmol/L 25 24 30   Calcium 8.6 - 10.4 mg/dL 9.9 9.8 9.6  Total Protein 6.1 - 8.1 g/dL 7.6 7.3 -  Total Bilirubin 0.2 - 1.2 mg/dL 0.5 0.5 -  Alkaline Phos 33 - 130 U/L 37 38 -  AST 10 - 35 U/L 12 10 -  ALT 6 - 29 U/L 10 9 -    Imaging: No results found.  Speciality Comments: No specialty comments available.    Procedures:  No procedures performed Allergies: Codeine and Penicillins   Assessment / Plan:     Visit Diagnoses: Polymyalgia rheumatica ([place]): She has no proximal muscle weakness or tenderness. She's tapering her prednisone by 1 mg every month. She is currently on 8 mg by mouth daily.  Rheumatoid arthritis  involving multiple sites with positive rheumatoid factor ([facility]) - RF 309, ANA negative, CRP 5.8, synovitis, treated by Dr. Philbert Riser in [place].. She has no synovitis on examination today. She will continue on current dose of methotrexate.  High risk medication use - Methotrexate 5 tablets po qweek, Folic acid 1 mg po qd and Prednisone 8 mg po qd - Plan: CBC with Differential/Platelet, COMPLETE METABOLIC PANEL WITH GFR WE will check her labs today and then every 3 months to monitor for drug toxicity.  Primary osteoarthritis of both hands: Joint protection and muscle strengthening discussed.  Primary osteoarthritis of both knees: Chronic pain  Primary osteoarthritis of both feet: Proper fitting shoes were discussed.  Osteoarthritis of lumbar spine, unspecified spinal osteoarthritis complication status: She continues to have a lot of pain and discomfort in her lower back. She will be following with Dr. Mina Marble.  Osteopenia of multiple sites - She she has history of osteopenia. She is on Fosamax.  Other medical problems are listed as follows:  Elevated C-reactive protein (CRP)  History of diabetes mellitus - She will be seeing Dr. Buddy Duty and will need close monitoring of her blood sugar  History of gastroesophageal reflux (GERD)  History of hyperlipidemia  History of hypertension  High risk medication use - Plan: CBC with Differential/Platelet, COMPLETE METABOLIC PANEL WITH GFR    Orders: No orders of the defined types were placed in this encounter.  No orders of the defined types were placed in this encounter.     Follow-Up Instructions: Return in about 4 months (around 06/24/2017) for Rheumatoid arthritis, Osteoarthritis DDD.   Bo Merino, MD  Note - This record has been created using Editor, commissioning.  Chart creation errors have been sought, but may not always  have been located. Such creation errors do not reflect on  the standard of medical care.

## 2017-02-13 DIAGNOSIS — M545 Low back pain: Secondary | ICD-10-CM | POA: Diagnosis not present

## 2017-02-15 DIAGNOSIS — M545 Low back pain: Secondary | ICD-10-CM | POA: Diagnosis not present

## 2017-02-16 ENCOUNTER — Ambulatory Visit (INDEPENDENT_AMBULATORY_CARE_PROVIDER_SITE_OTHER): Payer: Medicare Other | Admitting: Interventional Cardiology

## 2017-02-16 ENCOUNTER — Encounter: Payer: Self-pay | Admitting: Interventional Cardiology

## 2017-02-16 VITALS — BP 122/64 | HR 64 | Ht 63.0 in | Wt 181.6 lb

## 2017-02-16 DIAGNOSIS — I25119 Atherosclerotic heart disease of native coronary artery with unspecified angina pectoris: Secondary | ICD-10-CM | POA: Diagnosis not present

## 2017-02-16 DIAGNOSIS — I2510 Atherosclerotic heart disease of native coronary artery without angina pectoris: Secondary | ICD-10-CM | POA: Diagnosis not present

## 2017-02-16 DIAGNOSIS — E782 Mixed hyperlipidemia: Secondary | ICD-10-CM | POA: Diagnosis not present

## 2017-02-16 DIAGNOSIS — I209 Angina pectoris, unspecified: Secondary | ICD-10-CM | POA: Diagnosis not present

## 2017-02-16 DIAGNOSIS — I10 Essential (primary) hypertension: Secondary | ICD-10-CM

## 2017-02-16 NOTE — Patient Instructions (Signed)

## 2017-02-16 NOTE — Progress Notes (Signed)
Cardiology Office Note   Date:  02/16/2017   ID:  Sophia Ward, DOB 1933/05/24, MRN [id number]  PCP:  [name], MD    No chief complaint on file. CAD   Wt Readings from Last 3 Encounters:  02/16/17 181 lb 9.6 oz (82.4 kg)  11/29/16 179 lb (81.2 kg)  10/28/16 177 lb (80.3 kg)       History of Present Illness: Sophia Ward is a 83 y.o. female  h/o CAD. She had an LAD stent placed in 2004 (drug-eluting stent for restenosis) and has done well since then.  Her PLavix was stopped due to anemia in the past.   Plavix was restarted later after anemia was stable.  She has Chronic back problems and has received injections in the past.  She has been intolerant of metformin in the past due to diarrhea.   In [name] 2018, she was in the hospital in [place] and was diagnosed with PMR.  She was later found to have rheumatoid arthritis.  MTX was started.  Prednisone is being tapered gradually.  Since the last visit, she has had more back problems and injections.  She is taking a muscle relaxant.  She has some difficulty with walking and exercise.   Denies : Chest pain. Dizziness. Leg edema. Nitroglycerin use. Orthopnea. Palpitations. Paroxysmal nocturnal dyspnea. Shortness of breath. Syncope.      Past Medical History:  Diagnosis Date  . Allergic rhinitis   . Anemia   . Arthritis    hands  . BPPV (benign paroxysmal positional vertigo)   . Coronary artery disease   . Coronary atherosclerosis of native coronary artery 01/30/2014  . Diabetes mellitus without complication ([place])   . Essential hypertension, benign 01/30/2014  . GERD (gastroesophageal reflux disease)   . Hypertension   . Mixed hyperlipidemia 01/30/2014  . Myocardial infarction ([place]) 2004   mild MI-no damage- had stents  . Osteopenia   . Post-menopausal   . Stenosing tenosynovitis     Past Surgical History:  Procedure Laterality Date  . ABDOMINAL HYSTERECTOMY    . APPENDECTOMY    . CARDIAC  CATHETERIZATION  2004  . carpel tunnel    . COLONOSCOPY WITH PROPOFOL N/A 10/30/2012   Procedure: COLONOSCOPY WITH PROPOFOL;  Surgeon: Garlan Fair, MD;  [facility];  Service: Endoscopy;  Laterality: N/A;  . CORONARY ANGIOPLASTY  2004  . DILATION AND CURETTAGE OF UTERUS    . ESOPHAGOGASTRODUODENOSCOPY (EGD) WITH PROPOFOL N/A 10/30/2012   Procedure: ESOPHAGOGASTRODUODENOSCOPY (EGD) WITH PROPOFOL;  Surgeon: Garlan Fair, MD;  [facility];  Service: Endoscopy;  Laterality: N/A;  . EYE SURGERY     bilateral cataract with lens implant  . left shouler surgery    . TONSILLECTOMY       Current Outpatient Prescriptions  Medication Sig Dispense Refill  . alendronate (FOSAMAX) 70 MG tablet Take 1 tablet (70 mg total) by mouth once a week. Take with a full glass of water on an empty stomach. 12 tablet 1  . amLODipine (NORVASC) 5 MG tablet Take 5 mg by mouth every morning.    Marland Kitchen atorvastatin (LIPITOR) 40 MG tablet Take 40 mg by mouth at bedtime.    . Calcium Carbonate-Vitamin D (CALCIUM + D PO) Take 1 tablet by mouth every morning.    . Cholecalciferol (VITAMIN D3) 1000 UNITS CAPS Take 1,000 Units by mouth every morning.    . clopidogrel (PLAVIX) 75 MG tablet Take 75 mg by mouth daily.    Marland Kitchen  desonide (DESOWEN) 0.05 % lotion Apply 1 application topically as needed.    . folic acid (FOLVITE) 1 MG tablet Take 1 tablet (1 mg total) by mouth daily. 90 tablet 3  . insulin lispro (HUMALOG KWIKPEN) 100 UNIT/ML KiwkPen Humalog KwikPen (U-100) Insulin 100 unit/mL subcutaneous  Injecting 7 at breakfast and 12 at lunch an dinner    . LEVEMIR FLEXTOUCH 100 UNIT/ML Pen   0  . methotrexate 2.5 MG tablet Take 5 tablets (12.5 mg total) by mouth once a week. 60 tablet 0  . metoprolol succinate (TOPROL-XL) 100 MG 24 hr tablet Take 100 mg by mouth every morning.    . nitroGLYCERIN (NITROSTAT) 0.4 MG SL tablet Place 1 tablet (0.4 mg total) under the tongue every 5 (five) minutes as needed for  chest pain (X3 DOSES MAX). 25 tablet 3  . Omega-3 Fatty Acids (FISH OIL PO) Take 1 capsule by mouth every morning.    . pantoprazole (PROTONIX) 40 MG tablet Take 40 mg by mouth daily after lunch.    . predniSONE (DELTASONE) 1 MG tablet Take with 5 mg tablets as directed with taper schedule. 300 tablet 0  . predniSONE (DELTASONE) 5 MG tablet Take 1 tablet (5 mg total) by mouth daily with breakfast. 90 tablet 1  . ramipril (ALTACE) 10 MG tablet Take 20 mg by mouth every morning.     No current facility-administered medications for this visit.     Allergies:   Codeine and Penicillins    Social History:  The patient  reports that she has never smoked. She has never used smokeless tobacco. She reports that she does not drink alcohol or use drugs.   Family History:  The patient's family history includes CAD in her father and mother; Heart attack in her mother.    ROS:  Please see the history of present illness.   Otherwise, review of systems are positive for back pain.   All other systems are reviewed and negative.    PHYSICAL EXAM: VS:  BP 122/64   Pulse 64   Ht 5\' 3"  (1.6 m)   Wt 181 lb 9.6 oz (82.4 kg)   SpO2 94%   BMI 32.17 kg/m  , BMI Body mass index is 32.17 kg/m. GEN: Well nourished, well developed, in no acute distress  HEENT: normal  Neck: no JVD, carotid bruits, or masses Cardiac: RRR; 1/6 early systolic murmur, no rubs, or gallops,no edema  Respiratory:  clear to auscultation bilaterally, normal work of breathing GI: soft, nontender, nondistended, + BS MS: no deformity or atrophy  Skin: warm and dry, no rash Neuro:  Strength and sensation are intact Psych: euthymic mood, full affect   EKG:   The ekg ordered today demonstrates NSR, nonspecific ST changes   Recent Labs: 11/07/2016: ALT 10; BUN 18; Creat 1.02; Hemoglobin 13.4; Platelets 363; Potassium 4.2; Sodium 139   Lipid Panel No results found for: CHOL, TRIG, HDL, CHOLHDL, VLDL, LDLCALC, LDLDIRECT   Other  studies Reviewed: Additional studies/ records that were reviewed today with results demonstrating: 2007 stress test showed low risk.   ASSESSMENT AND PLAN:  1. Coronary artery disease: No angina on medical therapy.  Continue Plavix. She is not taking aspirin these days. Since her last PCI was many years ago, she can stop her Plavix prior to any future back injections that are needed.  DES for restenosis in 2004. 2. Hyperlipidemia: Continue lipid-lowering therapy, atorvastatin. 3. Hypertension: Continue current antihypertensives. 4. She travels to [place] in the winter.  She has back rehab so may go in October this year.  I suggested water aerobics for her since she has access to a pool there in [place].   Current medicines are reviewed at length with the patient today.  The patient concerns regarding her medicines were addressed.  The following changes have been made:  No change  Labs/ tests ordered today include:  No orders of the defined types were placed in this encounter.   Recommend 150 minutes/week of aerobic exercise Low fat, low carb, high fiber diet recommended  Disposition:   FU in 1 year   Signed, Larae Grooms, MD  02/16/2017 10:24 AM    [facility] [place], [place], [place]  [address] Phone: [phone]; Fax: [phone]

## 2017-02-20 DIAGNOSIS — M545 Low back pain: Secondary | ICD-10-CM | POA: Diagnosis not present

## 2017-02-21 ENCOUNTER — Other Ambulatory Visit: Payer: Self-pay | Admitting: Interventional Cardiology

## 2017-02-21 DIAGNOSIS — E119 Type 2 diabetes mellitus without complications: Secondary | ICD-10-CM | POA: Diagnosis not present

## 2017-02-21 DIAGNOSIS — M2550 Pain in unspecified joint: Secondary | ICD-10-CM | POA: Diagnosis not present

## 2017-02-21 DIAGNOSIS — Z794 Long term (current) use of insulin: Secondary | ICD-10-CM | POA: Diagnosis not present

## 2017-02-22 ENCOUNTER — Telehealth (INDEPENDENT_AMBULATORY_CARE_PROVIDER_SITE_OTHER): Payer: Self-pay | Admitting: Radiology

## 2017-02-22 ENCOUNTER — Ambulatory Visit (INDEPENDENT_AMBULATORY_CARE_PROVIDER_SITE_OTHER): Payer: Medicare Other | Admitting: Rheumatology

## 2017-02-22 ENCOUNTER — Encounter: Payer: Self-pay | Admitting: Rheumatology

## 2017-02-22 VITALS — BP 137/62 | HR 63 | Resp 12 | Ht 62.0 in | Wt 181.0 lb

## 2017-02-22 DIAGNOSIS — M353 Polymyalgia rheumatica: Secondary | ICD-10-CM

## 2017-02-22 DIAGNOSIS — M19041 Primary osteoarthritis, right hand: Secondary | ICD-10-CM

## 2017-02-22 DIAGNOSIS — M19071 Primary osteoarthritis, right ankle and foot: Secondary | ICD-10-CM

## 2017-02-22 DIAGNOSIS — Z8679 Personal history of other diseases of the circulatory system: Secondary | ICD-10-CM | POA: Diagnosis not present

## 2017-02-22 DIAGNOSIS — M0579 Rheumatoid arthritis with rheumatoid factor of multiple sites without organ or systems involvement: Secondary | ICD-10-CM

## 2017-02-22 DIAGNOSIS — Z79899 Other long term (current) drug therapy: Secondary | ICD-10-CM

## 2017-02-22 DIAGNOSIS — M47816 Spondylosis without myelopathy or radiculopathy, lumbar region: Secondary | ICD-10-CM

## 2017-02-22 DIAGNOSIS — I2510 Atherosclerotic heart disease of native coronary artery without angina pectoris: Secondary | ICD-10-CM

## 2017-02-22 DIAGNOSIS — M8589 Other specified disorders of bone density and structure, multiple sites: Secondary | ICD-10-CM | POA: Diagnosis not present

## 2017-02-22 DIAGNOSIS — Z8719 Personal history of other diseases of the digestive system: Secondary | ICD-10-CM | POA: Diagnosis not present

## 2017-02-22 DIAGNOSIS — M19042 Primary osteoarthritis, left hand: Secondary | ICD-10-CM

## 2017-02-22 DIAGNOSIS — M170 Bilateral primary osteoarthritis of knee: Secondary | ICD-10-CM

## 2017-02-22 DIAGNOSIS — Z8639 Personal history of other endocrine, nutritional and metabolic disease: Secondary | ICD-10-CM | POA: Diagnosis not present

## 2017-02-22 DIAGNOSIS — R7982 Elevated C-reactive protein (CRP): Secondary | ICD-10-CM

## 2017-02-22 DIAGNOSIS — M19072 Primary osteoarthritis, left ankle and foot: Secondary | ICD-10-CM

## 2017-02-22 LAB — COMPLETE METABOLIC PANEL WITH GFR
AG Ratio: 1.7 (calc) (ref 1.0–2.5)
ALT: 19 U/L (ref 6–29)
AST: 20 U/L (ref 10–35)
Albumin: 4.3 g/dL (ref 3.6–5.1)
Alkaline phosphatase (APISO): 33 U/L (ref 33–130)
BUN/Creatinine Ratio: 19 (calc) (ref 6–22)
BUN: 17 mg/dL (ref 7–25)
CO2: 27 mmol/L (ref 20–32)
Calcium: 9.9 mg/dL (ref 8.6–10.4)
Chloride: 101 mmol/L (ref 98–110)
Creat: 0.9 mg/dL — ABNORMAL HIGH (ref 0.60–0.88)
GFR, Est African American: 69 mL/min/{1.73_m2} (ref >=60)
GFR, Est Non African American: 60 mL/min/{1.73_m2} (ref >=60)
Globulin: 2.6 g/dL (calc) (ref 1.9–3.7)
Glucose, Bld: 139 mg/dL — ABNORMAL HIGH (ref 65–99)
Potassium: 4.3 mmol/L (ref 3.5–5.3)
Sodium: 140 mmol/L (ref 135–146)
Total Bilirubin: 0.8 mg/dL (ref 0.2–1.2)
Total Protein: 6.9 g/dL (ref 6.1–8.1)

## 2017-02-22 LAB — CBC WITH DIFFERENTIAL/PLATELET
Basophils Absolute: 88 cells/uL (ref 0–200)
Basophils Relative: 0.6 %
Eosinophils Absolute: 118 cells/uL (ref 15–500)
Eosinophils Relative: 0.8 %
HCT: 41 % (ref 35.0–45.0)
Hemoglobin: 13.8 g/dL (ref 11.7–15.5)
Lymphs Abs: 1940 cells/uL (ref 850–3900)
MCH: 31.2 pg (ref 27.0–33.0)
MCHC: 33.7 g/dL (ref 32.0–36.0)
MCV: 92.6 fL (ref 80.0–100.0)
MPV: 10.2 fL (ref 7.5–12.5)
Monocytes Relative: 7.5 %
Neutro Abs: 11451 cells/uL — ABNORMAL HIGH (ref 1500–7800)
Neutrophils Relative %: 77.9 %
Platelets: 342 10*3/uL (ref 140–400)
RBC: 4.43 10*6/uL (ref 3.80–5.10)
RDW: 13.3 % (ref 11.0–15.0)
Total Lymphocyte: 13.2 %
WBC mixed population: 1103 cells/uL — ABNORMAL HIGH (ref 200–950)
WBC: 14.7 10*3/uL — ABNORMAL HIGH (ref 3.8–10.8)

## 2017-02-22 NOTE — Telephone Encounter (Signed)
Patient called and states Dr. Estanislado Pandy wanted her to call back with the physician's name that treated her in [place], [place] so the office note could be sent.  Physician name is Seabron Spates.  Pt requests call back to let her know that you have sent the office note and received the message. Thanks.

## 2017-02-22 NOTE — Telephone Encounter (Signed)
Please let Pt. Know after you fax the note. Thanks

## 2017-02-22 NOTE — Patient Instructions (Signed)
Standing Labs We placed an order today for your standing lab work.    Please come back and get your standing labs in December and every 3 months  We have open lab Monday through Friday from 8:30-11:30 AM and 1:30-4 PM at the office of Dr. Iktan Aikman.   The office is located at [address], [address], [place], [place] [address] No appointment is necessary.   Labs are drawn by [facility].  You may receive a bill from [facility] for your lab work. If you have any questions regarding directions or hours of operation,  please call [phone].    

## 2017-02-23 DIAGNOSIS — M545 Low back pain: Secondary | ICD-10-CM | POA: Diagnosis not present

## 2017-02-23 NOTE — Telephone Encounter (Signed)
Fax Number [phone]  Office Note Faxed.

## 2017-02-23 NOTE — Telephone Encounter (Signed)
Ok to stop Fosamax and observe if symptoms improve.

## 2017-02-25 DIAGNOSIS — Z23 Encounter for immunization: Secondary | ICD-10-CM | POA: Diagnosis not present

## 2017-02-27 DIAGNOSIS — M48061 Spinal stenosis, lumbar region without neurogenic claudication: Secondary | ICD-10-CM | POA: Diagnosis not present

## 2017-02-27 DIAGNOSIS — M545 Low back pain: Secondary | ICD-10-CM | POA: Diagnosis not present

## 2017-03-14 DIAGNOSIS — M48061 Spinal stenosis, lumbar region without neurogenic claudication: Secondary | ICD-10-CM | POA: Diagnosis not present

## 2017-04-04 ENCOUNTER — Telehealth: Payer: Self-pay

## 2017-04-04 MED ORDER — PREDNISONE 1 MG PO TABS: ORAL_TABLET | ORAL | 0 refills | Status: DC

## 2017-04-04 MED ORDER — METHOTREXATE SODIUM 2.5 MG PO TABS: 12.5000 mg | ORAL_TABLET | ORAL | 0 refills | Status: DC

## 2017-04-04 NOTE — Addendum Note (Signed)
Addended by: Carole Binning on: 04/04/2017 10:10 AM   Modules accepted: Orders

## 2017-04-04 NOTE — Telephone Encounter (Signed)
Prescriptions sent to the pharmacy and patient advised.

## 2017-04-04 NOTE — Telephone Encounter (Signed)
Last Visit: 02/22/17 Next Visit: 05/17/17 Labs: 02/22/17 Labs consistent with previous  Okay to refill MTX and prednisone per Dr. Estanislado Pandy

## 2017-04-04 NOTE — Telephone Encounter (Signed)
Patient would like a Rx refill on MTX 2.5mg  and a Rx for Prednisone for 1 mg tablet sent to [facility] in [facility].  CB# is [phone].  Please advise.  Thank You.

## 2017-05-01 DIAGNOSIS — M48061 Spinal stenosis, lumbar region without neurogenic claudication: Secondary | ICD-10-CM | POA: Diagnosis not present

## 2017-05-04 DIAGNOSIS — Z85828 Personal history of other malignant neoplasm of skin: Secondary | ICD-10-CM | POA: Diagnosis not present

## 2017-05-04 DIAGNOSIS — D0439 Carcinoma in situ of skin of other parts of face: Secondary | ICD-10-CM | POA: Diagnosis not present

## 2017-05-04 DIAGNOSIS — D0462 Carcinoma in situ of skin of left upper limb, including shoulder: Secondary | ICD-10-CM | POA: Diagnosis not present

## 2017-05-04 DIAGNOSIS — D1801 Hemangioma of skin and subcutaneous tissue: Secondary | ICD-10-CM | POA: Diagnosis not present

## 2017-05-04 DIAGNOSIS — L570 Actinic keratosis: Secondary | ICD-10-CM | POA: Diagnosis not present

## 2017-05-04 DIAGNOSIS — D692 Other nonthrombocytopenic purpura: Secondary | ICD-10-CM | POA: Diagnosis not present

## 2017-05-04 DIAGNOSIS — L821 Other seborrheic keratosis: Secondary | ICD-10-CM | POA: Diagnosis not present

## 2017-05-04 DIAGNOSIS — L812 Freckles: Secondary | ICD-10-CM | POA: Diagnosis not present

## 2017-05-04 DIAGNOSIS — D485 Neoplasm of uncertain behavior of skin: Secondary | ICD-10-CM | POA: Diagnosis not present

## 2017-05-05 DIAGNOSIS — M545 Low back pain: Secondary | ICD-10-CM | POA: Diagnosis not present

## 2017-05-06 NOTE — Progress Notes (Signed)
Office Visit Note  Patient: Sophia Ward             Date of Birth: 1932-06-04           MRN: [id number]             PCP: [name], MD Referring: [name], MD Visit Date: 05/17/2017 Occupation: @GUAROCC @    Subjective:  Lower back pain    History of Present Illness: Sophia ESPE is a 83 y.o. female with history of rheumatoid arthritis, osteoarthritis, and PMR.  Patient states her rheumatoid arthritis has been well controlled on MTX 5 tablets weekly, folic acid 1 mg daily.  She denies any PMR flares recently.  She is on Prednisone 5 mg and tapering by 1 mg each month.  She denies any joint swelling or pain.  She reports joint stiffness, especially in her hands.  Patient reports she discontinued Fosamax. The patients main concern has been her lower back, which has spinal stenosis.  She has been seeing Dr. Mina Marble at [facility] for epidural sterioid injection, which has helped minmially.  She has had several MRIs as well.  She is going to continue to follow along with Dr. Mina Marble and has another epidural steroid injection 06/08/16.  She states the pain radiates down her right thigh.       Activities of Daily Living:  Patient reports morning stiffness for 2 hours.   Patient Denies nocturnal pain.  Difficulty dressing/grooming: Denies Difficulty climbing stairs: Reports Difficulty getting out of chair: Denies Difficulty using hands for taps, buttons, cutlery, and/or writing: Denies   Review of Systems  Constitutional: Positive for fatigue. Negative for weakness.  HENT: Positive for mouth dryness (Uses biotene). Negative for mouth sores and nose dryness.   Eyes: Positive for dryness. Negative for redness.  Respiratory: Negative for cough, hemoptysis and shortness of breath.   Cardiovascular: Negative.  Negative for chest pain, palpitations, hypertension and swelling in legs/feet.  Gastrointestinal: Positive for diarrhea. Negative for blood in stool and  constipation.  Endocrine: Negative for increased urination.  Genitourinary: Negative for painful urination.  Musculoskeletal: Negative for arthralgias, joint pain, joint swelling, myalgias, muscle weakness, morning stiffness, muscle tenderness and myalgias.  Skin: Negative for color change, pallor, rash, hair loss, nodules/bumps, redness, skin tightness, ulcers and sensitivity to sunlight.  Neurological: Negative for dizziness, numbness and headaches.  Psychiatric/Behavioral: Negative.  Negative for depressed mood and sleep disturbance. The patient is not nervous/anxious.     PMFS History:  Patient Active Problem List   Diagnosis Date Noted  . Polymyalgia rheumatica ([place]) 11/25/2016  . Primary osteoarthritis of both hands 11/25/2016  . Bilateral primary osteoarthritis of knee 11/25/2016  . Osteopenia of multiple sites 11/25/2016  . 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 Osteoarthritis of lumbar spine 10/28/2016  . History of diabetes mellitus 10/27/2016  . History of gastroesophageal reflux (GERD) 10/27/2016  . Rheumatoid arthritis involving multiple sites with positive rheumatoid factor ([place]) 10/14/2016  . Elevated C-reactive protein (CRP) 10/14/2016  . Elevated sed rate 10/14/2016  . High risk medication use 10/14/2016  . Coronary atherosclerosis of native coronary artery 01/30/2014  . Mixed hyperlipidemia 01/30/2014  . Essential hypertension, benign 01/30/2014  . Obesity, unspecified 01/30/2014    Past Medical History:  Diagnosis Date  . Allergic rhinitis   . Anemia   . Arthritis    hands  . BPPV (benign paroxysmal positional vertigo)   . Coronary artery disease   . Coronary atherosclerosis of native  coronary artery 01/30/2014  . Diabetes mellitus without complication ([place])   . Essential hypertension, benign 01/30/2014  . GERD (gastroesophageal reflux disease)   . Hypertension   . Mixed hyperlipidemia 01/30/2014  . Myocardial infarction ([place]) 2004   mild  MI-no damage- had stents  . Osteopenia   . Post-menopausal   . Stenosing tenosynovitis     Family History  Problem Relation Age of Onset  . CAD Mother   . Heart attack Mother   . CAD Father   . Heart Problems Brother        open heart surgery   . Arthritis Daughter        psoriatic arthritis    Past Surgical History:  Procedure Laterality Date  . ABDOMINAL HYSTERECTOMY    . APPENDECTOMY    . CARDIAC CATHETERIZATION  2004  . carpel tunnel    . COLONOSCOPY WITH PROPOFOL N/A 10/30/2012   Procedure: COLONOSCOPY WITH PROPOFOL;  Surgeon: Garlan Fair, MD;  Location: [facility];  Service: Endoscopy;  Laterality: N/A;  . CORONARY ANGIOPLASTY  2004  . DILATION AND CURETTAGE OF UTERUS    . ESOPHAGOGASTRODUODENOSCOPY (EGD) WITH PROPOFOL N/A 10/30/2012   Procedure: ESOPHAGOGASTRODUODENOSCOPY (EGD) WITH PROPOFOL;  Surgeon: Garlan Fair, MD;  Location: [facility];  Service: Endoscopy;  Laterality: N/A;  . EYE SURGERY     bilateral cataract with lens implant  . left shouler surgery    . TONSILLECTOMY     Social History   Social History Narrative  . Not on file     Objective: Vital Signs: BP 126/62 (BP Location: Left Arm, Patient Position: Sitting, Cuff Size: Normal)   Pulse 70   Ht 5' 2.5" (1.588 m)   Wt 186 lb (84.4 kg)   BMI 33.48 kg/m    Physical Exam  Constitutional: She is oriented to person, place, and time. She appears well-developed and well-nourished.  HENT:  Head: Normocephalic and atraumatic.  Eyes: Conjunctivae and EOM are normal.  Neck: Normal range of motion.  Cardiovascular: Normal rate, regular rhythm, normal heart sounds and intact distal pulses.  Pulmonary/Chest: Effort normal and breath sounds normal.  Abdominal: Soft. Bowel sounds are normal.  Lymphadenopathy:    She has no cervical adenopathy.  Neurological: She is alert and oriented to person, place, and time.  Skin: Skin is warm and dry. Capillary refill takes less than 2 seconds.    Psychiatric: She has a normal mood and affect. Her behavior is normal.  Nursing note and vitals reviewed.    Musculoskeletal Exam: C-spine good ROM.  Lumbar limited ROM with significant discomfort.  Tenderness upon palpation of lumbar spine. Shoulder joints, elbow joints, wrist joints good ROM with no synovitis.  MCPs, PIPs, and DIPs good ROM with no synovitis.  Bilateral 2nd and 3rd MCP thickening.  DIP/PIP synovial thickening consistent with ostearthritis.  Hip joints, knee joints, and ankle joints good ROM. MTPs, PIPs, and DIPs good ROM with no synovitis. No trochanteric bursitis.    CDAI Exam: CDAI Homunculus Exam:   Joint Counts:  CDAI Tender Joint count: 0 CDAI Swollen Joint count: 0  Global Assessments:  Patient Global Assessment: 1 Provider Global Assessment: 1  CDAI Calculated Score: 2    Investigation: No additional findings. CBC Latest Ref Rng & Units 02/22/2017 11/07/2016 10/28/2016  WBC 3.8 - 10.8 Thousand/uL 14.7(H) 19.3(H) 18.4(H)  Hemoglobin 11.7 - 15.5 g/dL 13.8 13.4 13.2  Hematocrit 35.0 - 45.0 % 41.0 40.8 41.0  Platelets 140 - 400 Thousand/uL 342 363  417(H)   CMP Latest Ref Rng & Units 02/22/2017 11/07/2016 10/28/2016  Glucose 65 - 99 mg/dL 139(H) 127(H) 163(H)  BUN 7 - 25 mg/dL 17 18 18   Creatinine 0.60 - 0.88 mg/dL 0.90(H) 1.02(H) 0.86  Sodium 135 - 146 mmol/L 140 139 139  Potassium 3.5 - 5.3 mmol/L 4.3 4.2 3.9  Chloride 98 - 110 mmol/L 101 98 102  CO2 20 - 32 mmol/L 27 25 24   Calcium 8.6 - 10.4 mg/dL 9.9 9.9 9.8  Total Protein 6.1 - 8.1 g/dL 6.9 7.6 7.3  Total Bilirubin 0.2 - 1.2 mg/dL 0.8 0.5 0.5  Alkaline Phos 33 - 130 U/L - 37 38  AST 10 - 35 U/L 20 12 10   ALT 6 - 29 U/L 19 10 9     Imaging: No results found.  Speciality Comments: No specialty comments available.    Procedures:  No procedures performed Allergies: Codeine and Penicillins   Assessment / Plan:     Visit Diagnoses: Polymyalgia rheumatica ([place]): No recent flares.  She is taking  Prednisone 5 mg and tapering by 1 mg each month. I discussed with patient today she has a flare she can increase her prednisone back up by 2 mg and then taper gradually. She is also supposed to notify us in case she has a flare.  Rheumatoid arthritis involving multiple sites with positive rheumatoid factor (HCC) - RF 309, ANA negative, CRP 5.8, synovitis, treated by Dr. Philbert Riser in [place]: No synovitis on exam.  She is taking MTX 5 tablets weekly and folic acid 1 mg daily.  She will continue on this current treatment regimen.  She is clinically doing very well. Date going to [place] and will be back after 5 months.   High risk medication use - MTX 5 tablets weekly, prednisone 5 mg.  She discontinued Fosamax.- CBC and CMP were drawn today.  She will have repeat labs in March and every 3 months following to monitor for drug toxicity.  Plan: CBC with Differential/Platelet, COMPLETE METABOLIC PANEL WITH GFR, COMPLETE METABOLIC PANEL WITH GFR, CBC with Differential/Platelet  Primary osteoarthritis of both hands: DIP/PIP synovial thickening consistent with osteoarthritis.  No synovitis on exam.    Primary osteoarthritis of both knees: Doing well.  No effusion or warmth on exam.    Primary osteoarthritis of both feet: Doing well. Discussed the importance of proper fitting shoes.  Osteoarthritis of lumbar spine, unspecified spinal osteoarthritis complication status - She continues to have a lot of pain and discomfort in her lower back. She will be following with Dr. Mina Marble.  She has a second epidural injection 06/08/16.    Osteopenia of multiple sites - She she has history of osteopenia. Discontinued Fosamax  Elevated C-reactive protein (CRP): WNL on 11/29/16  Other medical conditions are listed as follows:   History of gastroesophageal reflux (GERD)  History of diabetes mellitus - She will be seeing Dr. Buddy Duty and will need close monitoring of her blood sugar  History of hypertension  History of  hyperlipidemia    Orders: No orders of the defined types were placed in this encounter.  No orders of the defined types were placed in this encounter.  Bo Merino, MD   Follow-Up Instructions: Return in about 5 months (around 10/15/2017) for Rheumatoid arthritis, Osteoarthritis, Polymyalgia Rheumatica.   Note - This record has been created using [employer].  Chart creation errors have been sought, but may not always  have been located. Such creation errors do not reflect on  the standard of medical care. 

## 2017-05-10 DIAGNOSIS — M545 Low back pain: Secondary | ICD-10-CM | POA: Diagnosis not present

## 2017-05-11 DIAGNOSIS — M069 Rheumatoid arthritis, unspecified: Secondary | ICD-10-CM | POA: Diagnosis not present

## 2017-05-11 DIAGNOSIS — E1122 Type 2 diabetes mellitus with diabetic chronic kidney disease: Secondary | ICD-10-CM | POA: Diagnosis not present

## 2017-05-11 DIAGNOSIS — E119 Type 2 diabetes mellitus without complications: Secondary | ICD-10-CM | POA: Diagnosis not present

## 2017-05-11 DIAGNOSIS — I252 Old myocardial infarction: Secondary | ICD-10-CM | POA: Diagnosis not present

## 2017-05-11 DIAGNOSIS — K219 Gastro-esophageal reflux disease without esophagitis: Secondary | ICD-10-CM | POA: Diagnosis not present

## 2017-05-11 DIAGNOSIS — I10 Essential (primary) hypertension: Secondary | ICD-10-CM | POA: Diagnosis not present

## 2017-05-11 DIAGNOSIS — E782 Mixed hyperlipidemia: Secondary | ICD-10-CM | POA: Diagnosis not present

## 2017-05-11 DIAGNOSIS — I2510 Atherosclerotic heart disease of native coronary artery without angina pectoris: Secondary | ICD-10-CM | POA: Diagnosis not present

## 2017-05-11 DIAGNOSIS — H524 Presbyopia: Secondary | ICD-10-CM | POA: Diagnosis not present

## 2017-05-11 DIAGNOSIS — M5416 Radiculopathy, lumbar region: Secondary | ICD-10-CM | POA: Diagnosis not present

## 2017-05-11 DIAGNOSIS — M353 Polymyalgia rheumatica: Secondary | ICD-10-CM | POA: Diagnosis not present

## 2017-05-11 DIAGNOSIS — E669 Obesity, unspecified: Secondary | ICD-10-CM | POA: Diagnosis not present

## 2017-05-11 DIAGNOSIS — H35373 Puckering of macula, bilateral: Secondary | ICD-10-CM | POA: Diagnosis not present

## 2017-05-11 DIAGNOSIS — Z961 Presence of intraocular lens: Secondary | ICD-10-CM | POA: Diagnosis not present

## 2017-05-11 DIAGNOSIS — Z1389 Encounter for screening for other disorder: Secondary | ICD-10-CM | POA: Diagnosis not present

## 2017-05-11 DIAGNOSIS — N183 Chronic kidney disease, stage 3 (moderate): Secondary | ICD-10-CM | POA: Diagnosis not present

## 2017-05-11 DIAGNOSIS — M48061 Spinal stenosis, lumbar region without neurogenic claudication: Secondary | ICD-10-CM | POA: Diagnosis not present

## 2017-05-11 DIAGNOSIS — M109 Gout, unspecified: Secondary | ICD-10-CM | POA: Diagnosis not present

## 2017-05-11 DIAGNOSIS — Z0000 Encounter for general adult medical examination without abnormal findings: Secondary | ICD-10-CM | POA: Diagnosis not present

## 2017-05-11 DIAGNOSIS — M8588 Other specified disorders of bone density and structure, other site: Secondary | ICD-10-CM | POA: Diagnosis not present

## 2017-05-12 DIAGNOSIS — Z961 Presence of intraocular lens: Secondary | ICD-10-CM | POA: Diagnosis not present

## 2017-05-12 DIAGNOSIS — H35371 Puckering of macula, right eye: Secondary | ICD-10-CM | POA: Diagnosis not present

## 2017-05-12 DIAGNOSIS — H53452 Other localized visual field defect, left eye: Secondary | ICD-10-CM | POA: Diagnosis not present

## 2017-05-12 DIAGNOSIS — H33191 Other retinoschisis and retinal cysts, right eye: Secondary | ICD-10-CM | POA: Diagnosis not present

## 2017-05-12 DIAGNOSIS — H3342 Traction detachment of retina, left eye: Secondary | ICD-10-CM | POA: Diagnosis not present

## 2017-05-12 DIAGNOSIS — H35433 Paving stone degeneration of retina, bilateral: Secondary | ICD-10-CM | POA: Diagnosis not present

## 2017-05-12 DIAGNOSIS — H33192 Other retinoschisis and retinal cysts, left eye: Secondary | ICD-10-CM | POA: Diagnosis not present

## 2017-05-12 DIAGNOSIS — H31092 Other chorioretinal scars, left eye: Secondary | ICD-10-CM | POA: Diagnosis not present

## 2017-05-16 DIAGNOSIS — M48061 Spinal stenosis, lumbar region without neurogenic claudication: Secondary | ICD-10-CM | POA: Diagnosis not present

## 2017-05-17 ENCOUNTER — Encounter: Payer: Self-pay | Admitting: Rheumatology

## 2017-05-17 ENCOUNTER — Ambulatory Visit (INDEPENDENT_AMBULATORY_CARE_PROVIDER_SITE_OTHER): Payer: Medicare Other | Admitting: Rheumatology

## 2017-05-17 ENCOUNTER — Telehealth: Payer: Self-pay | Admitting: Interventional Cardiology

## 2017-05-17 VITALS — BP 126/62 | HR 70 | Ht 62.5 in | Wt 186.0 lb

## 2017-05-17 DIAGNOSIS — M0579 Rheumatoid arthritis with rheumatoid factor of multiple sites without organ or systems involvement: Secondary | ICD-10-CM | POA: Diagnosis not present

## 2017-05-17 DIAGNOSIS — M170 Bilateral primary osteoarthritis of knee: Secondary | ICD-10-CM | POA: Diagnosis not present

## 2017-05-17 DIAGNOSIS — Z8679 Personal history of other diseases of the circulatory system: Secondary | ICD-10-CM

## 2017-05-17 DIAGNOSIS — M19042 Primary osteoarthritis, left hand: Secondary | ICD-10-CM

## 2017-05-17 DIAGNOSIS — I2510 Atherosclerotic heart disease of native coronary artery without angina pectoris: Secondary | ICD-10-CM

## 2017-05-17 DIAGNOSIS — R7982 Elevated C-reactive protein (CRP): Secondary | ICD-10-CM

## 2017-05-17 DIAGNOSIS — Z8719 Personal history of other diseases of the digestive system: Secondary | ICD-10-CM

## 2017-05-17 DIAGNOSIS — M47816 Spondylosis without myelopathy or radiculopathy, lumbar region: Secondary | ICD-10-CM

## 2017-05-17 DIAGNOSIS — M8589 Other specified disorders of bone density and structure, multiple sites: Secondary | ICD-10-CM

## 2017-05-17 DIAGNOSIS — M19072 Primary osteoarthritis, left ankle and foot: Secondary | ICD-10-CM

## 2017-05-17 DIAGNOSIS — M19071 Primary osteoarthritis, right ankle and foot: Secondary | ICD-10-CM | POA: Diagnosis not present

## 2017-05-17 DIAGNOSIS — Z8639 Personal history of other endocrine, nutritional and metabolic disease: Secondary | ICD-10-CM | POA: Diagnosis not present

## 2017-05-17 DIAGNOSIS — M353 Polymyalgia rheumatica: Secondary | ICD-10-CM | POA: Diagnosis not present

## 2017-05-17 DIAGNOSIS — Z79899 Other long term (current) drug therapy: Secondary | ICD-10-CM

## 2017-05-17 DIAGNOSIS — M19041 Primary osteoarthritis, right hand: Secondary | ICD-10-CM

## 2017-05-17 LAB — CBC WITH DIFFERENTIAL/PLATELET
Basophils Absolute: 85 cells/uL (ref 0–200)
Basophils Relative: 0.6 %
Eosinophils Absolute: 270 cells/uL (ref 15–500)
Eosinophils Relative: 1.9 %
HCT: 39.2 % (ref 35.0–45.0)
Hemoglobin: 13.4 g/dL (ref 11.7–15.5)
Lymphs Abs: 1619 cells/uL (ref 850–3900)
MCH: 31.4 pg (ref 27.0–33.0)
MCHC: 34.2 g/dL (ref 32.0–36.0)
MCV: 91.8 fL (ref 80.0–100.0)
MPV: 10.4 fL (ref 7.5–12.5)
Monocytes Relative: 9.3 %
Neutro Abs: 10906 cells/uL — ABNORMAL HIGH (ref 1500–7800)
Neutrophils Relative %: 76.8 %
Platelets: 342 10*3/uL (ref 140–400)
RBC: 4.27 10*6/uL (ref 3.80–5.10)
RDW: 12.6 % (ref 11.0–15.0)
Total Lymphocyte: 11.4 %
WBC mixed population: 1321 cells/uL — ABNORMAL HIGH (ref 200–950)
WBC: 14.2 10*3/uL — ABNORMAL HIGH (ref 3.8–10.8)

## 2017-05-17 LAB — COMPLETE METABOLIC PANEL WITH GFR
AG Ratio: 1.7 (calc) (ref 1.0–2.5)
ALT: 16 U/L (ref 6–29)
AST: 15 U/L (ref 10–35)
Albumin: 4.4 g/dL (ref 3.6–5.1)
Alkaline phosphatase (APISO): 44 U/L (ref 33–130)
BUN/Creatinine Ratio: 17 (calc) (ref 6–22)
BUN: 20 mg/dL (ref 7–25)
CO2: 26 mmol/L (ref 20–32)
Calcium: 9.6 mg/dL (ref 8.6–10.4)
Chloride: 103 mmol/L (ref 98–110)
Creat: 1.17 mg/dL — ABNORMAL HIGH (ref 0.60–0.88)
GFR, Est African American: 51 mL/min/{1.73_m2} — ABNORMAL LOW (ref >=60)
GFR, Est Non African American: 44 mL/min/{1.73_m2} — ABNORMAL LOW (ref >=60)
Globulin: 2.6 g/dL (calc) (ref 1.9–3.7)
Glucose, Bld: 125 mg/dL — ABNORMAL HIGH (ref 65–99)
Potassium: 4.1 mmol/L (ref 3.5–5.3)
Sodium: 140 mmol/L (ref 135–146)
Total Bilirubin: 0.4 mg/dL (ref 0.2–1.2)
Total Protein: 7 g/dL (ref 6.1–8.1)

## 2017-05-17 NOTE — Telephone Encounter (Signed)
° °  [facility] Pre-operative Risk Assessment    Request for surgical clearance:  1. What type of surgery is being performed? Epidural   2. When is this surgery scheduled? 05/31/2017  3. Are there any medications that need to be held prior to surgery and how long? plavix for 7 days  4. Practice name and name of physician performing surgery? Dr.Hao Jacelyn Grip  5. What is your office phone and fax number?  [phone]  Fax [phone]  6. Anesthesia type (None, local, MAC, general) ? Local    Sophia Ward 05/17/2017, 2:27 PM  _________________________________________________________________   (provider comments below)

## 2017-05-17 NOTE — Patient Instructions (Signed)
Standing Labs We placed an order today for your standing lab work.    Please come back and get your standing labs in March and every 3 months following  We have open lab Monday through Friday from 8:30-11:30 AM and 1:30-4 PM at the office of Dr. Bo Merino.   The office is located at [address], [place], [place], [place] [address] No appointment is necessary.   Labs are drawn by [facility].  You may receive a bill from [facility] for your lab work. If you have any questions regarding directions or hours of operation,  please call [phone].

## 2017-05-17 NOTE — Telephone Encounter (Signed)
Left the patient message for her to call back regarding clearance

## 2017-05-18 ENCOUNTER — Telehealth: Payer: Self-pay

## 2017-05-18 DIAGNOSIS — E119 Type 2 diabetes mellitus without complications: Secondary | ICD-10-CM | POA: Diagnosis not present

## 2017-05-18 DIAGNOSIS — I2510 Atherosclerotic heart disease of native coronary artery without angina pectoris: Secondary | ICD-10-CM | POA: Diagnosis not present

## 2017-05-18 DIAGNOSIS — E1122 Type 2 diabetes mellitus with diabetic chronic kidney disease: Secondary | ICD-10-CM | POA: Diagnosis not present

## 2017-05-18 DIAGNOSIS — Z794 Long term (current) use of insulin: Secondary | ICD-10-CM | POA: Diagnosis not present

## 2017-05-18 DIAGNOSIS — I10 Essential (primary) hypertension: Secondary | ICD-10-CM | POA: Diagnosis not present

## 2017-05-18 DIAGNOSIS — I25119 Atherosclerotic heart disease of native coronary artery with unspecified angina pectoris: Secondary | ICD-10-CM | POA: Diagnosis not present

## 2017-05-18 DIAGNOSIS — E782 Mixed hyperlipidemia: Secondary | ICD-10-CM | POA: Diagnosis not present

## 2017-05-18 DIAGNOSIS — I252 Old myocardial infarction: Secondary | ICD-10-CM | POA: Diagnosis not present

## 2017-05-18 DIAGNOSIS — N183 Chronic kidney disease, stage 3 (moderate): Secondary | ICD-10-CM | POA: Diagnosis not present

## 2017-05-18 DIAGNOSIS — M069 Rheumatoid arthritis, unspecified: Secondary | ICD-10-CM | POA: Diagnosis not present

## 2017-05-18 NOTE — Telephone Encounter (Signed)
   [facility] Pre-operative Risk Assessment    Request for surgical clearance:  1. What type of surgery is being performed? Lumbar spine epidural injection   2. When is this surgery scheduled? TBD  3. Are there any medications that need to be held prior to surgery and how long? D/C plavix 7 days prior to injection   4. Practice name and name of physician performing surgery?  1. [facility] 2. Dr. Normajean Glasgow  5. What is your office phone and fax number?  1. Phone [phone] 2. Fax: [phone]   6. Anesthesia type (None, local, MAC, general) ? None specified    _________________________________________________________________   (provider comments below)

## 2017-05-18 NOTE — Telephone Encounter (Signed)
Clearance note faxed to Dr.Hao Mina Marble at fax # [phone].

## 2017-05-18 NOTE — Telephone Encounter (Signed)
   Primary Cardiologist: No primary care provider on file.  Chart reviewed as part of pre-operative protocol coverage. Given past medical history, time since last visit, and my phone call to the patient today, based on ACC/AHA guidelines, DANNA SEWELL would be at acceptable risk for the planned procedure without further cardiovascular testing.   I will route this recommendation to the requesting party via Epic fax function and remove from pre-op pool.  Please call with questions.  Kerin Ransom, PA-C 05/18/2017, 4:18 PM

## 2017-05-18 NOTE — Progress Notes (Signed)
Due to decreasing GFR place decrease methotrexate to 3 tablets by mouth every week. WBCs are high due to prednisone use.

## 2017-05-26 NOTE — Telephone Encounter (Signed)
   Primary Cardiologist: No primary care provider on file.  Chart reviewed as part of pre-operative protocol coverage. Patient was contacted 05/26/2017 in reference to pre-operative risk assessment for pending surgery as outlined below.  Sophia Ward was last seen on 02/16/17 by Dr. Irish Lack.  Since that day, Sophia Ward has done well w/o angina. No exertional symptoms. Dr. Irish Lack outlined in office note 02/16/17 that it is ok for her to hold Plavix x 5 days prior to spinal injection.   Therefore, based on ACC/AHA guidelines, the patient would be at acceptable risk for the planned procedure without further cardiovascular testing. Pt notified.   I will route this recommendation to the requesting party via Epic fax function and remove from pre-op pool.  Please call with questions.  Lyda Jester, PA-C 05/26/2017, 2:14 PM

## 2017-06-02 DIAGNOSIS — H35371 Puckering of macula, right eye: Secondary | ICD-10-CM | POA: Diagnosis not present

## 2017-06-02 DIAGNOSIS — H33192 Other retinoschisis and retinal cysts, left eye: Secondary | ICD-10-CM | POA: Diagnosis not present

## 2017-06-02 DIAGNOSIS — H35352 Cystoid macular degeneration, left eye: Secondary | ICD-10-CM | POA: Diagnosis not present

## 2017-06-02 DIAGNOSIS — Z961 Presence of intraocular lens: Secondary | ICD-10-CM | POA: Diagnosis not present

## 2017-06-06 DIAGNOSIS — H3342 Traction detachment of retina, left eye: Secondary | ICD-10-CM | POA: Diagnosis not present

## 2017-06-07 ENCOUNTER — Telehealth: Payer: Self-pay | Admitting: Rheumatology

## 2017-06-07 DIAGNOSIS — Z79899 Other long term (current) drug therapy: Secondary | ICD-10-CM

## 2017-06-07 NOTE — Telephone Encounter (Signed)
Patient's daughter [name] called to see if her mom could wait until April to get her labs done.  She will be in [place] in March when her labs are due and it is difficult for her to have them done there.  CB [phone]

## 2017-06-08 DIAGNOSIS — M48061 Spinal stenosis, lumbar region without neurogenic claudication: Secondary | ICD-10-CM | POA: Diagnosis not present

## 2017-06-08 NOTE — Telephone Encounter (Signed)
[name] advised that labs need to be done in March in order to keep her mothers medication refilled. Lab orders faxed to [name] so she may get them to her mother.

## 2017-06-13 DIAGNOSIS — H35352 Cystoid macular degeneration, left eye: Secondary | ICD-10-CM | POA: Diagnosis not present

## 2017-06-13 DIAGNOSIS — H3342 Traction detachment of retina, left eye: Secondary | ICD-10-CM | POA: Diagnosis not present

## 2017-06-28 DIAGNOSIS — I10 Essential (primary) hypertension: Secondary | ICD-10-CM | POA: Diagnosis not present

## 2017-06-28 DIAGNOSIS — M069 Rheumatoid arthritis, unspecified: Secondary | ICD-10-CM | POA: Diagnosis not present

## 2017-06-28 DIAGNOSIS — N183 Chronic kidney disease, stage 3 (moderate): Secondary | ICD-10-CM | POA: Diagnosis not present

## 2017-06-28 DIAGNOSIS — I25119 Atherosclerotic heart disease of native coronary artery with unspecified angina pectoris: Secondary | ICD-10-CM | POA: Diagnosis not present

## 2017-06-28 DIAGNOSIS — E1122 Type 2 diabetes mellitus with diabetic chronic kidney disease: Secondary | ICD-10-CM | POA: Diagnosis not present

## 2017-06-28 DIAGNOSIS — I2510 Atherosclerotic heart disease of native coronary artery without angina pectoris: Secondary | ICD-10-CM | POA: Diagnosis not present

## 2017-06-28 DIAGNOSIS — E782 Mixed hyperlipidemia: Secondary | ICD-10-CM | POA: Diagnosis not present

## 2017-06-28 DIAGNOSIS — Z794 Long term (current) use of insulin: Secondary | ICD-10-CM | POA: Diagnosis not present

## 2017-06-28 DIAGNOSIS — I252 Old myocardial infarction: Secondary | ICD-10-CM | POA: Diagnosis not present

## 2017-06-28 DIAGNOSIS — E119 Type 2 diabetes mellitus without complications: Secondary | ICD-10-CM | POA: Diagnosis not present

## 2017-07-05 ENCOUNTER — Telehealth: Payer: Self-pay

## 2017-07-05 NOTE — Telephone Encounter (Signed)
   [facility] Pre-operative Risk Assessment    Request for surgical clearance:  1. What type of surgery is being performed? Interventional injection for therapeutic pain management   2. When is this surgery scheduled? TBD   3. What type of clearance is required (medical clearance vs. Pharmacy clearance to hold med vs. Both)? Medical clearance to hold PLAVIX  4. Are there any medications that need to be held prior to surgery and how long? Plavix for 7 days   5. Practice name and name of physician performing surgery? [facility]//Dr. Tommi Rumps   6. What is your office phone and fax number?  1. Phone: not listed 2. Fax: [phone]   7. Anesthesia type (None, local, MAC, general) ? Not specified  **Clearance states: "We will consider this approval valid for one year unless you state otherwise.**

## 2017-07-05 NOTE — Telephone Encounter (Signed)
Patient of Dr. Irish Lack who had PCI in 2004. Last seen by Dr. Irish Lack in Sept 2018 at which time she was doing well except for back pain. Dr. Irish Lack cleared the patient to hold plavix prior to any procedure at that time. Since then, we have also cleared the patient for back injection in Dec 2018.   Now we received another preop clearance for back injection. I left message for the patient to call us back to talk to on call preop APP. If no recent chest pain or SOB, I think she can be cleared for injection.   Hilbert Corrigan PA Pager: [phone]

## 2017-07-05 NOTE — Telephone Encounter (Signed)
   Primary Cardiologist: Larae Grooms, MD  Chart reviewed as part of pre-operative protocol coverage. Patient was contacted 07/05/2017 in reference to pre-operative risk assessment for pending surgery as outlined below.  Sophia Ward was last seen on 02/16/2017 by Dr. Larae Grooms.  Since that day, Sophia Ward has done very well, denying any chest pain and SOB. She was cleared for back injection in Dec 2018. Given stable symptom, she is cleared again for back injection if needed after holding plavix for 7 days. She will need to restart plavix after the procedure as soon as possible at the discretion of the physician who performed the procedure.  Therefore, based on ACC/AHA guidelines, the patient would be at acceptable risk for the planned procedure without further cardiovascular testing.   I will route this recommendation to the requesting party via Epic fax function and remove from pre-op pool.  Please call with questions.  [place], [place] 07/05/2017, 4:17 PM

## 2017-07-06 NOTE — Telephone Encounter (Signed)
Clearance routed via Epic fax. 

## 2017-07-13 ENCOUNTER — Telehealth: Payer: Self-pay | Admitting: Rheumatology

## 2017-07-13 MED ORDER — PREDNISONE 1 MG PO TABS
ORAL_TABLET | ORAL | 0 refills | Status: DC
Start: 2017-07-13 — End: 2017-10-31

## 2017-07-13 NOTE — Telephone Encounter (Signed)
Last Visit: 05/17/17 Next Visit: 10/31/17

## 2017-07-13 NOTE — Telephone Encounter (Signed)
Patient is currently on Prednisone 3 mg. Patient will be tapering to 2 mg in March and then 1 mg in April. Prescription sent to the pharmacy.

## 2017-07-13 NOTE — Telephone Encounter (Signed)
Patient called requesting prescription refill of Prednisone.  Patient wants the prescription sent to [facility] in [place], [place].  Phone  [phone]

## 2017-07-14 DIAGNOSIS — Z961 Presence of intraocular lens: Secondary | ICD-10-CM | POA: Diagnosis not present

## 2017-07-14 DIAGNOSIS — H31092 Other chorioretinal scars, left eye: Secondary | ICD-10-CM | POA: Diagnosis not present

## 2017-07-14 DIAGNOSIS — H43811 Vitreous degeneration, right eye: Secondary | ICD-10-CM | POA: Diagnosis not present

## 2017-07-19 ENCOUNTER — Telehealth: Payer: Self-pay | Admitting: Rheumatology

## 2017-07-19 NOTE — Telephone Encounter (Signed)
Patient calling in reference to Prednisone. Patient wants you to know [facility] has Prednisone RX, but they are holding it until insurance will cover it. Patient just wanted to let you know.

## 2017-08-08 DIAGNOSIS — Z79899 Other long term (current) drug therapy: Secondary | ICD-10-CM | POA: Diagnosis not present

## 2017-08-28 ENCOUNTER — Telehealth: Payer: Self-pay | Admitting: Rheumatology

## 2017-08-28 NOTE — Telephone Encounter (Signed)
Patient called requesting prescription refill of Methotrexate.   Patient states they are currently in [place] and need the prescription sent to the [facility] in [place], [place].  Their phone # [phone]

## 2017-08-28 NOTE — Telephone Encounter (Signed)
Patient called stating that she had her bloodwork done in [place] on 08/08/17.  Patient states the results were faxed to our office on 08/10/17 at 2:31 and their office received confirmation.  Patient states she is having the lab resend the results today.

## 2017-08-28 NOTE — Telephone Encounter (Signed)
Last Visit: 05/07/17 Next Visit: 10/31/17 Labs: 05/17/17 Due to decreasing GFR place decrease methotrexate to 3 tablets by mouth every week. WBCs are high due to prednisone use

## 2017-08-29 ENCOUNTER — Telehealth: Payer: Self-pay | Admitting: Rheumatology

## 2017-08-29 MED ORDER — METHOTREXATE SODIUM 2.5 MG PO TABS: 7.5000 mg | ORAL_TABLET | ORAL | 0 refills | Status: DC

## 2017-08-29 NOTE — Telephone Encounter (Signed)
Received lab results drawn 08/08/17 and labs are stable.   Okay to refill MTX per Dr. Estanislado Pandy

## 2017-08-29 NOTE — Telephone Encounter (Signed)
Patient advised we have received lab results.

## 2017-08-29 NOTE — Telephone Encounter (Signed)
Patient called to see if we received her lab results.  Patient states they were being faxed to the office.

## 2017-09-05 DIAGNOSIS — I2510 Atherosclerotic heart disease of native coronary artery without angina pectoris: Secondary | ICD-10-CM | POA: Diagnosis not present

## 2017-09-05 DIAGNOSIS — N183 Chronic kidney disease, stage 3 (moderate): Secondary | ICD-10-CM | POA: Diagnosis not present

## 2017-09-05 DIAGNOSIS — I25119 Atherosclerotic heart disease of native coronary artery with unspecified angina pectoris: Secondary | ICD-10-CM | POA: Diagnosis not present

## 2017-09-05 DIAGNOSIS — I10 Essential (primary) hypertension: Secondary | ICD-10-CM | POA: Diagnosis not present

## 2017-09-05 DIAGNOSIS — Z7984 Long term (current) use of oral hypoglycemic drugs: Secondary | ICD-10-CM | POA: Diagnosis not present

## 2017-09-05 DIAGNOSIS — M069 Rheumatoid arthritis, unspecified: Secondary | ICD-10-CM | POA: Diagnosis not present

## 2017-09-05 DIAGNOSIS — E782 Mixed hyperlipidemia: Secondary | ICD-10-CM | POA: Diagnosis not present

## 2017-09-05 DIAGNOSIS — E1122 Type 2 diabetes mellitus with diabetic chronic kidney disease: Secondary | ICD-10-CM | POA: Diagnosis not present

## 2017-09-05 DIAGNOSIS — I252 Old myocardial infarction: Secondary | ICD-10-CM | POA: Diagnosis not present

## 2017-09-18 DIAGNOSIS — Z7901 Long term (current) use of anticoagulants: Secondary | ICD-10-CM | POA: Diagnosis not present

## 2017-09-18 DIAGNOSIS — M5416 Radiculopathy, lumbar region: Secondary | ICD-10-CM | POA: Diagnosis not present

## 2017-09-18 DIAGNOSIS — M4726 Other spondylosis with radiculopathy, lumbar region: Secondary | ICD-10-CM | POA: Diagnosis not present

## 2017-09-18 DIAGNOSIS — M5136 Other intervertebral disc degeneration, lumbar region: Secondary | ICD-10-CM | POA: Diagnosis not present

## 2017-09-18 DIAGNOSIS — M5126 Other intervertebral disc displacement, lumbar region: Secondary | ICD-10-CM | POA: Diagnosis not present

## 2017-09-18 DIAGNOSIS — M48061 Spinal stenosis, lumbar region without neurogenic claudication: Secondary | ICD-10-CM | POA: Diagnosis not present

## 2017-09-18 DIAGNOSIS — M545 Low back pain: Secondary | ICD-10-CM | POA: Diagnosis not present

## 2017-09-18 DIAGNOSIS — M5380 Other specified dorsopathies, site unspecified: Secondary | ICD-10-CM | POA: Diagnosis not present

## 2017-09-27 DIAGNOSIS — E119 Type 2 diabetes mellitus without complications: Secondary | ICD-10-CM | POA: Diagnosis not present

## 2017-09-27 DIAGNOSIS — I2510 Atherosclerotic heart disease of native coronary artery without angina pectoris: Secondary | ICD-10-CM | POA: Diagnosis not present

## 2017-09-27 DIAGNOSIS — Z794 Long term (current) use of insulin: Secondary | ICD-10-CM | POA: Diagnosis not present

## 2017-09-27 DIAGNOSIS — M2550 Pain in unspecified joint: Secondary | ICD-10-CM | POA: Diagnosis not present

## 2017-10-09 DIAGNOSIS — H53452 Other localized visual field defect, left eye: Secondary | ICD-10-CM | POA: Diagnosis not present

## 2017-10-09 DIAGNOSIS — H35433 Paving stone degeneration of retina, bilateral: Secondary | ICD-10-CM | POA: Diagnosis not present

## 2017-10-09 DIAGNOSIS — H35352 Cystoid macular degeneration, left eye: Secondary | ICD-10-CM | POA: Diagnosis not present

## 2017-10-09 DIAGNOSIS — H33191 Other retinoschisis and retinal cysts, right eye: Secondary | ICD-10-CM | POA: Diagnosis not present

## 2017-10-09 DIAGNOSIS — H3342 Traction detachment of retina, left eye: Secondary | ICD-10-CM | POA: Diagnosis not present

## 2017-10-09 DIAGNOSIS — H33192 Other retinoschisis and retinal cysts, left eye: Secondary | ICD-10-CM | POA: Diagnosis not present

## 2017-10-09 DIAGNOSIS — H35371 Puckering of macula, right eye: Secondary | ICD-10-CM | POA: Diagnosis not present

## 2017-10-12 DIAGNOSIS — I10 Essential (primary) hypertension: Secondary | ICD-10-CM | POA: Diagnosis not present

## 2017-10-12 DIAGNOSIS — E119 Type 2 diabetes mellitus without complications: Secondary | ICD-10-CM | POA: Diagnosis not present

## 2017-10-19 NOTE — Progress Notes (Signed)
Office Visit Note  Patient: Sophia Ward             Date of Birth: 10/27/1932           MRN: [id number]             PCP: [name], MD Referring: [name], MD Visit Date: 10/31/2017 Occupation: @GUAROCC @    Subjective:  Pain in multiple joints   History of Present Illness: Sophia Ward is a 84 y.o. female with history of polymyalgia rheumatica, seropositive rheumatoid arthritis, and osteoarthritis.  She continues to take methotrexate 3 tablets once weekly and folic acid 1 mg daily.  She is no longer on prednisone and discontinued at the end of April.  She states around that same time she started developing hip pain and shoulder pain.  She states that she has been having difficulty getting up from a chair getting out of a car.  States she is also been having difficulty lifting her shoulders.  She reports that the pain and weakness is similar to when she had a PMR flare.  She denies any pain in her temples, headaches, blurred vision.  She states that her rheumatoid arthritis ulcerative flare about 1 week ago.  She is having pain and swelling in bilateral hands.  She states that she is also having stiffness in multiple joints is lasting 3 to 4 hours in the morning.  She reports that she had an epidural injection x2 and her last injection was in April which help with her lower back pain.    Activities of Daily Living:  Patient reports morning stiffness for 3-4 hours.   Patient Reports nocturnal pain.  Difficulty dressing/grooming: Denies Difficulty climbing stairs: Reports Difficulty getting out of chair: Reports Difficulty using hands for taps, buttons, cutlery, and/or writing: Reports   Review of Systems  Constitutional: Positive for fatigue.  HENT: Positive for mouth dryness. Negative for mouth sores and nose dryness.   Eyes: Positive for redness. Negative for pain, visual disturbance and dryness.  Respiratory: Negative for cough, hemoptysis, shortness of  breath and difficulty breathing.   Cardiovascular: Negative for chest pain, palpitations, hypertension and swelling in legs/feet.  Gastrointestinal: Negative for blood in stool, constipation and diarrhea.  Endocrine: Negative for increased urination.  Genitourinary: Negative for painful urination.  Musculoskeletal: Positive for arthralgias, joint pain, joint swelling, myalgias, muscle weakness, morning stiffness and myalgias. Negative for muscle tenderness.  Skin: Negative for color change, pallor, rash, hair loss, nodules/bumps, skin tightness, ulcers and sensitivity to sunlight.  Allergic/Immunologic: Negative for susceptible to infections.  Neurological: Negative for dizziness, numbness, headaches and weakness.  Hematological: Negative for swollen glands.  Psychiatric/Behavioral: Positive for depressed mood. Negative for sleep disturbance. The patient is not nervous/anxious.     PMFS History:  Patient Active Problem List   Diagnosis Date Noted  . Polymyalgia rheumatica ([place]) 11/25/2016  . Primary osteoarthritis of both hands 11/25/2016  . Bilateral primary osteoarthritis of knee 11/25/2016  . Osteopenia of multiple sites 11/25/2016  . 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 Osteoarthritis of lumbar spine 10/28/2016  . History of diabetes mellitus 10/27/2016  . History of gastroesophageal reflux (GERD) 10/27/2016  . Rheumatoid arthritis involving multiple sites with positive rheumatoid factor ([place]) 10/14/2016  . Elevated C-reactive protein (CRP) 10/14/2016  . Elevated sed rate 10/14/2016  . High risk medication use 10/14/2016  . Coronary atherosclerosis of native coronary artery 01/30/2014  . Mixed hyperlipidemia 01/30/2014  . Essential hypertension, benign 01/30/2014  .  Obesity, unspecified 01/30/2014    Past Medical History:  Diagnosis Date  . Allergic rhinitis   . Anemia   . Arthritis    hands  . BPPV (benign paroxysmal positional vertigo)   . Coronary  artery disease   . Coronary atherosclerosis of native coronary artery 01/30/2014  . Diabetes mellitus without complication ([place])   . Essential hypertension, benign 01/30/2014  . GERD (gastroesophageal reflux disease)   . Hypertension   . Mixed hyperlipidemia 01/30/2014  . Myocardial infarction ([place]) 2004   mild MI-no damage- had stents  . Osteopenia   . Post-menopausal   . Stenosing tenosynovitis     Family History  Problem Relation Age of Onset  . CAD Mother   . Heart attack Mother   . CAD Father   . Heart Problems Brother        open heart surgery   . Arthritis Daughter        psoriatic arthritis    Past Surgical History:  Procedure Laterality Date  . ABDOMINAL HYSTERECTOMY    . APPENDECTOMY    . CARDIAC CATHETERIZATION  2004  . carpel tunnel    . COLONOSCOPY WITH PROPOFOL N/A 10/30/2012   Procedure: COLONOSCOPY WITH PROPOFOL;  Surgeon: Garlan Fair, MD;  Location: [facility];  Service: Endoscopy;  Laterality: N/A;  . CORONARY ANGIOPLASTY  2004  . DILATION AND CURETTAGE OF UTERUS    . ESOPHAGOGASTRODUODENOSCOPY (EGD) WITH PROPOFOL N/A 10/30/2012   Procedure: ESOPHAGOGASTRODUODENOSCOPY (EGD) WITH PROPOFOL;  Surgeon: Garlan Fair, MD;  Location: [facility];  Service: Endoscopy;  Laterality: N/A;  . EYE SURGERY     bilateral cataract with lens implant  . EYE SURGERY    . left shouler surgery    . TONSILLECTOMY     Social History   Social History Narrative  . Not on file     Objective: Vital Signs: BP 132/69 (BP Location: Left Arm, Patient Position: Sitting, Cuff Size: Large)   Pulse 77   Resp 14   Ht 5' 2.5" (1.588 m)   Wt 181 lb (82.1 kg)   BMI 32.58 kg/m    Physical Exam  Constitutional: She is oriented to person, place, and time. She appears well-developed and well-nourished.  HENT:  Head: Normocephalic and atraumatic.  Eyes: Conjunctivae and EOM are normal.  Neck: Normal range of motion.  Cardiovascular: Normal rate, regular rhythm and intact distal  pulses.  Murmur heard. Pulmonary/Chest: Effort normal and breath sounds normal.  Abdominal: Soft. Bowel sounds are normal.  Lymphadenopathy:    She has no cervical adenopathy.  Neurological: She is alert and oriented to person, place, and time.  Skin: Skin is warm and dry. Capillary refill takes less than 2 seconds.  Psychiatric: She has a normal mood and affect. Her behavior is normal.  Nursing note and vitals reviewed.    Musculoskeletal Exam: C-spine good range of motion.  Thoracic lumbar spine limited range of motion.  No midline spinal tenderness.  No SI joint tenderness.  Limited shoulder abduction.  Elbow joints good range of motion.  She has slightly limited range of motion of bilateral wrists.  No synovitis or tenderness of MCP joints.  She is synovitis of several PIPs as well as tenderness.  She has DIP synovial thickening consistent with osteoarthritic changes.  She has discomfort with hip range of motion.  Knee joints, ankle joints, MTPs, PIPs, DIPs good range of motion no synovitis.  She has pedal edema bilaterally.  No warmth or effusion of  bilateral knees.  Tenderness of trochanter bursa.  CDAI Exam: CDAI Homunculus Exam:   Tenderness:  Right hand: 2nd PIP, 3rd PIP, 4th PIP and 5th PIP Left hand: 2nd PIP, 3rd PIP, 4th PIP and 5th PIP  Swelling:  Right hand: 2nd PIP, 3rd PIP and 4th PIP Left hand: 2nd PIP, 3rd PIP and 4th PIP  Joint Counts:  CDAI Tender Joint count: 8 CDAI Swollen Joint count: 6  Global Assessments:  Patient Global Assessment: 7 Provider Global Assessment: 7  CDAI Calculated Score: 28    Investigation: No additional findings. Labs: 08/08/2017 stable   Imaging: No results found.  Speciality Comments: No specialty comments available.    Procedures:  No procedures performed Allergies: Codeine and Penicillins   Assessment / Plan:     Visit Diagnoses: Polymyalgia rheumatica ([place]) -she has been having increased muscle tenderness and muscle  weakness in upper and lower extremities.  She has been having difficulty getting up from a chair as well as lifting her shoulders.  She has been off of prednisone since the end of April 2019.  She reports that her symptoms began shortly after finishing her prednisone taper.  We will check a sedimentation rate today.  We will start her on prednisone 10 mg p.o. daily with breakfast.  Potential side effects were discussed.  She also continue taking methotrexate 3 tablets once weekly.  She was advised to notify us if she develops any new or worsening symptoms.  Plan: Sedimentation rate  Rheumatoid arthritis involving multiple sites with positive rheumatoid factor (HCC) - RF 309, ANA negative, CRP 5.8, synovitis, treated by Dr. Philbert Riser in [place]: She has active synovitis of several PIP joints on exam.  She reports that she started having rheumatoid arthritis flare 1 week ago.  She has been taking methotrexate 3 tablets once weekly and folic acid 1 mg daily.  She discontinued prednisone use at the end of April 2019.  Due to her low GFR she is only been taking methotrexate 3 tablets once weekly.  We discussed switching her to [place] but she would not like to make a change at this time.  She will be started on prednisone 10 mg daily for management of PMR.  High risk medication use - MTX, folic acid, prednisone  - CBC and CMP were ordered today to monitor for drug toxicity.  She will return in September and every 3 months for lab work.  Plan: CBC with Differential/Platelet, COMPLETE METABOLIC PANEL WITH GFR  Primary osteoarthritis of both hands: She has PIP and DIP synovial thickening consistent with osteoarthritis of bilateral hands.  She has PIP tenderness and synovitis today.  Joint protection and muscle strengthening were discussed.  Primary osteoarthritis of both knees: No warmth or effusion.  She has some discomfort in her knees especially if she is climbing steps.  Primary osteoarthritis of both feet:  She has PIP and DIP synovial thickening consistent with osteoarthritis of bilateral feet.  The importance of wearing proper fitting shoes was discussed.  Osteoarthritis of lumbar spine, unspecified spinal osteoarthritis complication status: Chronic pain.  She recently had an epidural injection which provided significant relief.  Osteopenia of multiple sites - She has history of osteopenia. Discontinued Fosamax.  She takes calcium and vitamin D on a daily basis.  Other medical conditions are listed as follows:  Elevated C-reactive protein (CRP)  History of hyperlipidemia  History of diabetes mellitus  History of gastroesophageal reflux (GERD)  History of hypertension    Orders: Orders Placed  This Encounter  Procedures  . CBC with Differential/Platelet  . COMPLETE METABOLIC PANEL WITH GFR  . Sedimentation rate   Meds ordered this encounter  Medications  . folic acid (FOLVITE) 1 MG tablet    Sig: Take 1 tablet (1 mg total) by mouth daily.    Dispense:  90 tablet    Refill:  3  . methotrexate 2.5 MG tablet    Sig: Take 3 tablets (7.5 mg total) by mouth once a week.    Dispense:  36 tablet    Refill:  0    Please consider 90 day supplies to promote better adherence  . diclofenac sodium (VOLTAREN) 1 % GEL    Sig: Apply 3 g to 3 large joints up to 3 times daily    Dispense:  3 Tube    Refill:  3  . predniSONE (DELTASONE) 10 MG tablet    Sig: Take 1 tablet (10 mg total) by mouth daily with breakfast.    Dispense:  30 tablet    Refill:  2    Face-to-face time spent with patient was 30 minutes.> 50% of time was spent in counseling and coordination of care.  Follow-Up Instructions: Return in about 5 months (around 04/02/2018) for Polymyalgia Rheumatica, Rheumatoid arthritis, Osteoarthritis.   Ofilia Neas, PA-C I examined and evaluated the patient with Hazel Sams PA.  Patient appears to be having polymyalgia rheumatica flare.  We will check her sed rate today.  We will  start her on prednisone 10 mg p.o. daily.  If her sed rate is not elevated I may consider a quick taper.  She will continue methotrexate for right now.  The plan of care was discussed as noted above.  Bo Merino, MD Note - This record has been created using Editor, commissioning.  Chart creation errors have been sought, but may not always  have been located. Such creation errors do not reflect on  the standard of medical care.

## 2017-10-24 DIAGNOSIS — H33022 Retinal detachment with multiple breaks, left eye: Secondary | ICD-10-CM | POA: Diagnosis not present

## 2017-10-24 DIAGNOSIS — H44752 Retained (nonmagnetic) (old) foreign body in vitreous body, left eye: Secondary | ICD-10-CM | POA: Diagnosis not present

## 2017-10-24 DIAGNOSIS — T85398A Other mechanical complication of other ocular prosthetic devices, implants and grafts, initial encounter: Secondary | ICD-10-CM | POA: Diagnosis not present

## 2017-10-30 ENCOUNTER — Encounter: Payer: Self-pay | Admitting: Interventional Cardiology

## 2017-10-30 DIAGNOSIS — D485 Neoplasm of uncertain behavior of skin: Secondary | ICD-10-CM | POA: Diagnosis not present

## 2017-10-30 DIAGNOSIS — Z85828 Personal history of other malignant neoplasm of skin: Secondary | ICD-10-CM | POA: Diagnosis not present

## 2017-10-30 DIAGNOSIS — L821 Other seborrheic keratosis: Secondary | ICD-10-CM | POA: Diagnosis not present

## 2017-10-30 DIAGNOSIS — L570 Actinic keratosis: Secondary | ICD-10-CM | POA: Diagnosis not present

## 2017-10-30 DIAGNOSIS — D1801 Hemangioma of skin and subcutaneous tissue: Secondary | ICD-10-CM | POA: Diagnosis not present

## 2017-10-30 DIAGNOSIS — D0462 Carcinoma in situ of skin of left upper limb, including shoulder: Secondary | ICD-10-CM | POA: Diagnosis not present

## 2017-10-30 DIAGNOSIS — L812 Freckles: Secondary | ICD-10-CM | POA: Diagnosis not present

## 2017-10-31 ENCOUNTER — Ambulatory Visit (INDEPENDENT_AMBULATORY_CARE_PROVIDER_SITE_OTHER): Payer: Medicare Other | Admitting: Rheumatology

## 2017-10-31 ENCOUNTER — Encounter: Payer: Self-pay | Admitting: Rheumatology

## 2017-10-31 VITALS — BP 132/69 | HR 77 | Resp 14 | Ht 62.5 in | Wt 181.0 lb

## 2017-10-31 DIAGNOSIS — M170 Bilateral primary osteoarthritis of knee: Secondary | ICD-10-CM

## 2017-10-31 DIAGNOSIS — M353 Polymyalgia rheumatica: Secondary | ICD-10-CM | POA: Diagnosis not present

## 2017-10-31 DIAGNOSIS — Z8679 Personal history of other diseases of the circulatory system: Secondary | ICD-10-CM | POA: Diagnosis not present

## 2017-10-31 DIAGNOSIS — M19041 Primary osteoarthritis, right hand: Secondary | ICD-10-CM | POA: Diagnosis not present

## 2017-10-31 DIAGNOSIS — M8589 Other specified disorders of bone density and structure, multiple sites: Secondary | ICD-10-CM | POA: Diagnosis not present

## 2017-10-31 DIAGNOSIS — M19071 Primary osteoarthritis, right ankle and foot: Secondary | ICD-10-CM

## 2017-10-31 DIAGNOSIS — M19042 Primary osteoarthritis, left hand: Secondary | ICD-10-CM

## 2017-10-31 DIAGNOSIS — M47816 Spondylosis without myelopathy or radiculopathy, lumbar region: Secondary | ICD-10-CM

## 2017-10-31 DIAGNOSIS — Z79899 Other long term (current) drug therapy: Secondary | ICD-10-CM | POA: Diagnosis not present

## 2017-10-31 DIAGNOSIS — R7982 Elevated C-reactive protein (CRP): Secondary | ICD-10-CM | POA: Diagnosis not present

## 2017-10-31 DIAGNOSIS — Z8719 Personal history of other diseases of the digestive system: Secondary | ICD-10-CM | POA: Diagnosis not present

## 2017-10-31 DIAGNOSIS — Z8639 Personal history of other endocrine, nutritional and metabolic disease: Secondary | ICD-10-CM | POA: Diagnosis not present

## 2017-10-31 DIAGNOSIS — M0579 Rheumatoid arthritis with rheumatoid factor of multiple sites without organ or systems involvement: Secondary | ICD-10-CM | POA: Diagnosis not present

## 2017-10-31 DIAGNOSIS — M19072 Primary osteoarthritis, left ankle and foot: Secondary | ICD-10-CM

## 2017-10-31 MED ORDER — FOLIC ACID 1 MG PO TABS: 1.0000 mg | ORAL_TABLET | Freq: Every day | ORAL | 3 refills | Status: DC

## 2017-10-31 MED ORDER — METHOTREXATE SODIUM 2.5 MG PO TABS: 7.5000 mg | ORAL_TABLET | ORAL | 0 refills | Status: DC

## 2017-10-31 MED ORDER — PREDNISONE 10 MG PO TABS: 10.0000 mg | ORAL_TABLET | Freq: Every day | ORAL | 2 refills | Status: DC

## 2017-10-31 MED ORDER — DICLOFENAC SODIUM 1 % TD GEL
TRANSDERMAL | 3 refills | Status: DC
Start: 2017-10-31 — End: 2019-10-27

## 2017-11-01 DIAGNOSIS — H35352 Cystoid macular degeneration, left eye: Secondary | ICD-10-CM | POA: Diagnosis not present

## 2017-11-01 LAB — CBC WITH DIFFERENTIAL/PLATELET
Basophils Absolute: 94 cells/uL (ref 0–200)
Basophils Relative: 0.7 %
Eosinophils Absolute: 201 cells/uL (ref 15–500)
Eosinophils Relative: 1.5 %
HCT: 37.4 % (ref 35.0–45.0)
Hemoglobin: 12.8 g/dL (ref 11.7–15.5)
Lymphs Abs: 2278 cells/uL (ref 850–3900)
MCH: 30.3 pg (ref 27.0–33.0)
MCHC: 34.2 g/dL (ref 32.0–36.0)
MCV: 88.4 fL (ref 80.0–100.0)
MPV: 10 fL (ref 7.5–12.5)
Monocytes Relative: 10.4 %
Neutro Abs: 9434 cells/uL — ABNORMAL HIGH (ref 1500–7800)
Neutrophils Relative %: 70.4 %
Platelets: 484 10*3/uL — ABNORMAL HIGH (ref 140–400)
RBC: 4.23 10*6/uL (ref 3.80–5.10)
RDW: 13.6 % (ref 11.0–15.0)
Total Lymphocyte: 17 %
WBC mixed population: 1394 cells/uL — ABNORMAL HIGH (ref 200–950)
WBC: 13.4 10*3/uL — ABNORMAL HIGH (ref 3.8–10.8)

## 2017-11-01 LAB — COMPLETE METABOLIC PANEL WITH GFR
AG Ratio: 1.4 (calc) (ref 1.0–2.5)
ALT: 12 U/L (ref 6–29)
AST: 16 U/L (ref 10–35)
Albumin: 4.2 g/dL (ref 3.6–5.1)
Alkaline phosphatase (APISO): 50 U/L (ref 33–130)
BUN/Creatinine Ratio: 12 (calc) (ref 6–22)
BUN: 11 mg/dL (ref 7–25)
CO2: 27 mmol/L (ref 20–32)
Calcium: 10.1 mg/dL (ref 8.6–10.4)
Chloride: 102 mmol/L (ref 98–110)
Creat: 0.95 mg/dL — ABNORMAL HIGH (ref 0.60–0.88)
GFR, Est African American: 65 mL/min/{1.73_m2} (ref >=60)
GFR, Est Non African American: 56 mL/min/{1.73_m2} — ABNORMAL LOW (ref >=60)
Globulin: 2.9 g/dL (calc) (ref 1.9–3.7)
Glucose, Bld: 122 mg/dL — ABNORMAL HIGH (ref 65–99)
Potassium: 4.6 mmol/L (ref 3.5–5.3)
Sodium: 141 mmol/L (ref 135–146)
Total Bilirubin: 0.6 mg/dL (ref 0.2–1.2)
Total Protein: 7.1 g/dL (ref 6.1–8.1)

## 2017-11-01 LAB — SEDIMENTATION RATE: Sed Rate: 84 mm/h — ABNORMAL HIGH (ref 0–30)

## 2017-11-01 NOTE — Progress Notes (Signed)
WBC is elevated but stable. Plt mildly elevated.  We will continue to monitor.  Glucose is 122.  Creatinine and GFR are improving.  Sed rate is 84.  Please advise patient to increase dose of Prednisone to 15 mg daily.  She was started on 10 mg yesterday.  We will recheck sed rate and BMP in 1 month.

## 2017-11-02 ENCOUNTER — Telehealth: Payer: Self-pay

## 2017-11-02 NOTE — Telephone Encounter (Signed)
Received a prior authorization request for Diclofenac gel from [employer]. Submitted a prior auth to pts insurance via cover my meds. Will update once we have a response.   Angeleen Horney, [employer], CPhT 9:40 AM

## 2017-11-03 ENCOUNTER — Telehealth: Payer: Self-pay | Admitting: Rheumatology

## 2017-11-03 DIAGNOSIS — Z79899 Other long term (current) drug therapy: Secondary | ICD-10-CM

## 2017-11-03 DIAGNOSIS — M353 Polymyalgia rheumatica: Secondary | ICD-10-CM

## 2017-11-03 NOTE — Telephone Encounter (Signed)
Patient called stating she was returning your call.   °

## 2017-11-03 NOTE — Telephone Encounter (Signed)
Patient advised of lab results and will increase Prednisone to 15 mg. Will come to office in one month th have sed rate and BMP checked. Patient also advised that the Diclofenac Gel has been approved through 11/03/2018.

## 2017-11-07 DIAGNOSIS — F341 Dysthymic disorder: Secondary | ICD-10-CM | POA: Diagnosis not present

## 2017-11-07 DIAGNOSIS — E1122 Type 2 diabetes mellitus with diabetic chronic kidney disease: Secondary | ICD-10-CM | POA: Diagnosis not present

## 2017-11-07 DIAGNOSIS — I252 Old myocardial infarction: Secondary | ICD-10-CM | POA: Diagnosis not present

## 2017-11-07 DIAGNOSIS — I2510 Atherosclerotic heart disease of native coronary artery without angina pectoris: Secondary | ICD-10-CM | POA: Diagnosis not present

## 2017-11-07 DIAGNOSIS — I10 Essential (primary) hypertension: Secondary | ICD-10-CM | POA: Diagnosis not present

## 2017-11-07 DIAGNOSIS — N183 Chronic kidney disease, stage 3 (moderate): Secondary | ICD-10-CM | POA: Diagnosis not present

## 2017-11-07 DIAGNOSIS — K219 Gastro-esophageal reflux disease without esophagitis: Secondary | ICD-10-CM | POA: Diagnosis not present

## 2017-11-07 DIAGNOSIS — M069 Rheumatoid arthritis, unspecified: Secondary | ICD-10-CM | POA: Diagnosis not present

## 2017-11-07 DIAGNOSIS — E782 Mixed hyperlipidemia: Secondary | ICD-10-CM | POA: Diagnosis not present

## 2017-11-07 DIAGNOSIS — M353 Polymyalgia rheumatica: Secondary | ICD-10-CM | POA: Diagnosis not present

## 2017-11-08 ENCOUNTER — Telehealth: Payer: Self-pay | Admitting: Rheumatology

## 2017-11-08 MED ORDER — PREDNISONE 5 MG PO TABS: 5.0000 mg | ORAL_TABLET | Freq: Every day | ORAL | 0 refills | Status: DC

## 2017-11-08 NOTE — Telephone Encounter (Signed)
Patient called stating Dr. Estanislado Pandy increased her Prednisone from 10 mg to 15 mg and she is waiting for it to be called into the pharmacy so she can pick it up.  [facility] [address] in [place].

## 2017-11-13 DIAGNOSIS — H5712 Ocular pain, left eye: Secondary | ICD-10-CM | POA: Diagnosis not present

## 2017-11-13 DIAGNOSIS — H16122 Filamentary keratitis, left eye: Secondary | ICD-10-CM | POA: Diagnosis not present

## 2017-11-23 DIAGNOSIS — N183 Chronic kidney disease, stage 3 (moderate): Secondary | ICD-10-CM | POA: Diagnosis not present

## 2017-11-23 DIAGNOSIS — I2510 Atherosclerotic heart disease of native coronary artery without angina pectoris: Secondary | ICD-10-CM | POA: Diagnosis not present

## 2017-11-23 DIAGNOSIS — M069 Rheumatoid arthritis, unspecified: Secondary | ICD-10-CM | POA: Diagnosis not present

## 2017-11-23 DIAGNOSIS — E119 Type 2 diabetes mellitus without complications: Secondary | ICD-10-CM | POA: Diagnosis not present

## 2017-11-23 DIAGNOSIS — I25119 Atherosclerotic heart disease of native coronary artery with unspecified angina pectoris: Secondary | ICD-10-CM | POA: Diagnosis not present

## 2017-11-23 DIAGNOSIS — Z7984 Long term (current) use of oral hypoglycemic drugs: Secondary | ICD-10-CM | POA: Diagnosis not present

## 2017-11-23 DIAGNOSIS — I10 Essential (primary) hypertension: Secondary | ICD-10-CM | POA: Diagnosis not present

## 2017-11-23 DIAGNOSIS — E1122 Type 2 diabetes mellitus with diabetic chronic kidney disease: Secondary | ICD-10-CM | POA: Diagnosis not present

## 2017-11-23 DIAGNOSIS — F341 Dysthymic disorder: Secondary | ICD-10-CM | POA: Diagnosis not present

## 2017-11-23 DIAGNOSIS — I252 Old myocardial infarction: Secondary | ICD-10-CM | POA: Diagnosis not present

## 2017-11-23 DIAGNOSIS — E782 Mixed hyperlipidemia: Secondary | ICD-10-CM | POA: Diagnosis not present

## 2017-11-27 ENCOUNTER — Other Ambulatory Visit: Payer: Self-pay

## 2017-11-27 ENCOUNTER — Other Ambulatory Visit: Payer: Self-pay | Admitting: *Deleted

## 2017-11-27 DIAGNOSIS — Z79899 Other long term (current) drug therapy: Secondary | ICD-10-CM

## 2017-11-27 DIAGNOSIS — M353 Polymyalgia rheumatica: Secondary | ICD-10-CM

## 2017-11-28 ENCOUNTER — Telehealth: Payer: Self-pay | Admitting: Rheumatology

## 2017-11-28 LAB — BASIC METABOLIC PANEL WITH GFR
BUN/Creatinine Ratio: 15 (calc) (ref 6–22)
BUN: 15 mg/dL (ref 7–25)
CO2: 31 mmol/L (ref 20–32)
Calcium: 9.8 mg/dL (ref 8.6–10.4)
Chloride: 103 mmol/L (ref 98–110)
Creat: 1.02 mg/dL — ABNORMAL HIGH (ref 0.60–0.88)
GFR, Est African American: 59 mL/min/{1.73_m2} — ABNORMAL LOW (ref >=60)
GFR, Est Non African American: 51 mL/min/{1.73_m2} — ABNORMAL LOW (ref >=60)
Glucose, Bld: 151 mg/dL — ABNORMAL HIGH (ref 65–99)
Potassium: 4 mmol/L (ref 3.5–5.3)
Sodium: 141 mmol/L (ref 135–146)

## 2017-11-28 LAB — URINALYSIS, ROUTINE W REFLEX MICROSCOPIC
Bilirubin Urine: NEGATIVE
Glucose, UA: NEGATIVE
Hyaline Cast: NONE SEEN /LPF
Ketones, ur: NEGATIVE
Nitrite: NEGATIVE
Specific Gravity, Urine: 1.014 (ref 1.001–1.03)
Squamous Epithelial / LPF: NONE SEEN /HPF (ref <=5)
pH: 6 (ref 5.0–8.0)

## 2017-11-28 LAB — SEDIMENTATION RATE: Sed Rate: 22 mm/h (ref 0–30)

## 2017-11-28 NOTE — Telephone Encounter (Signed)
Patient called requesting her urine test report be faxed to Dr. Lysle Rubens at # [phone]

## 2017-11-28 NOTE — Telephone Encounter (Signed)
Patient called back stating that Dr. Lysle Rubens would like to receive the report this afternoon if possible and to include Attention Elmyra Ricks on the fax.

## 2017-11-28 NOTE — Telephone Encounter (Signed)
Re-faxed the labs attn Elmyra Ricks.

## 2017-12-25 ENCOUNTER — Telehealth: Payer: Self-pay | Admitting: Rheumatology

## 2017-12-25 DIAGNOSIS — M353 Polymyalgia rheumatica: Secondary | ICD-10-CM

## 2017-12-25 NOTE — Telephone Encounter (Signed)
Returned patient's call and advised patient she is due to have a sed rate rechecked on 12/28/2017. Patient verbalized understanding and will come by the office on Thursday. Lab order has been placed.

## 2017-12-25 NOTE — Telephone Encounter (Signed)
Patient left a message requesting a call to let her know if she is due for labs this month. Please call to advise.

## 2017-12-28 ENCOUNTER — Other Ambulatory Visit: Payer: Self-pay

## 2017-12-28 DIAGNOSIS — M353 Polymyalgia rheumatica: Secondary | ICD-10-CM

## 2017-12-29 LAB — SEDIMENTATION RATE: Sed Rate: 14 mm/h (ref 0–30)

## 2018-01-11 DIAGNOSIS — H35373 Puckering of macula, bilateral: Secondary | ICD-10-CM | POA: Diagnosis not present

## 2018-01-11 DIAGNOSIS — E119 Type 2 diabetes mellitus without complications: Secondary | ICD-10-CM | POA: Diagnosis not present

## 2018-01-11 DIAGNOSIS — Z961 Presence of intraocular lens: Secondary | ICD-10-CM | POA: Diagnosis not present

## 2018-01-11 DIAGNOSIS — H524 Presbyopia: Secondary | ICD-10-CM | POA: Diagnosis not present

## 2018-01-17 ENCOUNTER — Telehealth: Payer: Self-pay | Admitting: Rheumatology

## 2018-01-17 NOTE — Telephone Encounter (Signed)
Is okay to taper prednisone by 1 mg every month.  She is on methotrexate.  She requires labs every 3 months.  Last labs were in June 2019.  She should get labs in September and then every 3 months.

## 2018-01-17 NOTE — Telephone Encounter (Signed)
Patient advised of plan and verbalized understanding. Patient will come in for labs before leaving town and then will be due again when her appointment comes in December.

## 2018-01-17 NOTE — Telephone Encounter (Signed)
Prednisone to 10 mg by mouth daily with breakfast for 1 month. We will recheck sed rate in 1 month. If it continues to be WNL, we will taper prednisone by 1 mg every month.   Sed rate 11/27/17 WNL Sed Rate 12/28/17 WNL  Patient would like to know if this is still the plan and if she will need more blood work prior to her appointment in December. Patient will be travelling to [place] approximately 02/16/18. Please advise.

## 2018-01-17 NOTE — Telephone Encounter (Signed)
Patient called requesting a return call for her labwork results.

## 2018-01-22 ENCOUNTER — Encounter: Payer: Self-pay | Admitting: Interventional Cardiology

## 2018-01-22 ENCOUNTER — Ambulatory Visit (INDEPENDENT_AMBULATORY_CARE_PROVIDER_SITE_OTHER): Payer: Medicare Other | Admitting: Interventional Cardiology

## 2018-01-22 VITALS — BP 140/62 | HR 62 | Ht 62.5 in | Wt 181.8 lb

## 2018-01-22 DIAGNOSIS — E782 Mixed hyperlipidemia: Secondary | ICD-10-CM | POA: Diagnosis not present

## 2018-01-22 DIAGNOSIS — I25119 Atherosclerotic heart disease of native coronary artery with unspecified angina pectoris: Secondary | ICD-10-CM | POA: Diagnosis not present

## 2018-01-22 DIAGNOSIS — R011 Cardiac murmur, unspecified: Secondary | ICD-10-CM | POA: Diagnosis not present

## 2018-01-22 DIAGNOSIS — I10 Essential (primary) hypertension: Secondary | ICD-10-CM

## 2018-01-22 MED ORDER — NITROGLYCERIN 0.4 MG SL SUBL: SUBLINGUAL_TABLET | SUBLINGUAL | 5 refills | Status: DC

## 2018-01-22 NOTE — Progress Notes (Signed)
Cardiology Office Note   Date:  01/22/2018   ID:  Sophia Ward, DOB 1932-06-24, MRN [id number]  PCP:  [name], MD    No chief complaint on file.  CAD  Wt Readings from Last 3 Encounters:  01/22/18 181 lb 12.8 oz (82.5 kg)  10/31/17 181 lb (82.1 kg)  05/17/17 186 lb (84.4 kg)       History of Present Illness: Sophia Ward is a 84 y.o. female   h/o CAD. She had an LAD stent placed in 2004 (drug-eluting stent for restenosis) and has done well since then.  Her PLavix was stopped due to anemia in the past.  Plavix was restarted later after anemia was stable.  She has chronic back problems and has received injections in the past.  She has been intolerant of metformin in the past due to diarrhea.   In [name] 2018, she was in the hospital in [place] and was diagnosed with PMR.  She was later found to have rheumatoid arthritis.  MTX was started.  Prednisone is being tapered gradually.  She has chronic back pain treated with injections.  She travels to [place] in the winter time.   In May 2019, she had a PMR flare.  She is on chronic Prednisone.    No sx like what she had before her stent.  At that time, she had DOE with minimal exertion.    Back pain limits her exercise.  She does have access to a recumbent bike.  Walking in grocery store is her most strenuous activity.  No sx with that activity.    Denies : Chest pain. Dizziness. Leg edema. Nitroglycerin use. Orthopnea. Palpitations. Paroxysmal nocturnal dyspnea. Shortness of breath. Syncope.   She has some numbness and pain in her legs not related to exertion.      Past Medical History:  Diagnosis Date  . Allergic rhinitis   . Anemia   . Arthritis    hands  . BPPV (benign paroxysmal positional vertigo)   . Coronary artery disease   . Coronary atherosclerosis of native coronary artery 01/30/2014  . Diabetes mellitus without complication ([place])   . Essential hypertension, benign 01/30/2014  . GERD  (gastroesophageal reflux disease)   . Hypertension   . Mixed hyperlipidemia 01/30/2014  . Myocardial infarction ([place]) 2004   mild MI-no damage- had stents  . Osteopenia   . Post-menopausal   . Stenosing tenosynovitis     Past Surgical History:  Procedure Laterality Date  . ABDOMINAL HYSTERECTOMY    . APPENDECTOMY    . CARDIAC CATHETERIZATION  2004  . carpel tunnel    . COLONOSCOPY WITH PROPOFOL N/A 10/30/2012   Procedure: COLONOSCOPY WITH PROPOFOL;  Surgeon: Garlan Fair, MD;  Location: [facility];  Service: Endoscopy;  Laterality: N/A;  . CORONARY ANGIOPLASTY  2004  . DILATION AND CURETTAGE OF UTERUS    . ESOPHAGOGASTRODUODENOSCOPY (EGD) WITH PROPOFOL N/A 10/30/2012   Procedure: ESOPHAGOGASTRODUODENOSCOPY (EGD) WITH PROPOFOL;  Surgeon: Garlan Fair, MD;  Location[facility];  Service: Endoscopy;  Laterality: N/A;  . EYE SURGERY     bilateral cataract with lens implant  . EYE SURGERY    . left shouler surgery    . TONSILLECTOMY       Current Outpatient Medications  Medication Sig Dispense Refill  . amLODipine (NORVASC) 5 MG tablet Take 10 mg by mouth every morning.     Marland Kitchen atorvastatin (LIPITOR) 40 MG tablet Take 40 mg by mouth at bedtime.    Marland Kitchen  Calcium Carbonate-Vitamin D (CALCIUM + D PO) Take 1 tablet by mouth every morning.    . Cholecalciferol (VITAMIN D3) 1000 UNITS CAPS Take 1,000 Units by mouth 2 (two) times daily.     . clopidogrel (PLAVIX) 75 MG tablet Take 75 mg by mouth daily.    . Coenzyme Q10 (COQ10) 200 MG CAPS Take by mouth daily.    Marland Kitchen desonide (DESOWEN) 0.05 % lotion Apply 1 application topically as needed.    . diclofenac sodium (VOLTAREN) 1 % GEL Apply 3 g to 3 large joints up to 3 times daily 3 Tube 3  . folic acid (FOLVITE) 1 MG tablet Take 1 tablet (1 mg total) by mouth daily. 90 tablet 3  . insulin lispro (HUMALOG KWIKPEN) 100 UNIT/ML KiwkPen Humalog KwikPen (U-100) Insulin 100 unit/mL subcutaneous  Injecting 7 at breakfast and 12 at lunch an dinner     . LEVEMIR FLEXTOUCH 100 UNIT/ML Pen 25 Units.   0  . methotrexate 2.5 MG tablet Take 3 tablets (7.5 mg total) by mouth once a week. 36 tablet 0  . metoprolol succinate (TOPROL-XL) 100 MG 24 hr tablet Take 100 mg by mouth every morning.    . nitroGLYCERIN (NITROSTAT) 0.4 MG SL tablet DISSOLVE ONE TABLET UNDER THE TONGUE EVERY 5 MINUTES AS NEEDED FOR CHEST PAIN.  DO NOT EXCEED A TOTAL OF 3 DOSES IN 15 MINUTES 25 tablet 5  . Omega-3 Fatty Acids (FISH OIL PO) Take 1 capsule by mouth every morning.    . pantoprazole (PROTONIX) 40 MG tablet Take 40 mg by mouth daily after lunch.    . predniSONE (DELTASONE) 10 MG tablet Take 1 tablet (10 mg total) by mouth daily with breakfast. 30 tablet 2  . ramipril (ALTACE) 10 MG tablet Take 20 mg by mouth every morning.     No current facility-administered medications for this visit.     Allergies:   Codeine and Penicillins    Social History:  The patient  reports that she has never smoked. She has never used smokeless tobacco. She reports that she does not drink alcohol or use drugs.   Family History:  The patient's family history includes Arthritis in her daughter; CAD in her father and mother; Heart Problems in her brother; Heart attack in her mother.    ROS:  Please see the history of present illness.   Otherwise, review of systems are positive for back pain.   All other systems are reviewed and negative.    PHYSICAL EXAM: VS:  BP 140/62   Pulse 62   Ht 5' 2.5" (1.588 m)   Wt 181 lb 12.8 oz (82.5 kg)   SpO2 93%   BMI 32.72 kg/m  , BMI Body mass index is 32.72 kg/m. GEN: Well nourished, well developed, in no acute distress  HEENT: normal  Neck: no JVD, carotid bruits, or masses Cardiac: RRR; 3/6 systolic murmur, no rubs, or gallops,no edema  Respiratory:  clear to auscultation bilaterally, normal work of breathing GI: soft, nontender, nondistended, + BS MS: no deformity or atrophy , 2+ PT pulses bilaterally Skin: warm and dry, no  rash Neuro:  Strength and sensation are intact Psych: euthymic mood, full affect   EKG:   The ekg ordered today demonstrates Normal sinus, no ST changes   Recent Labs: 10/31/2017: ALT 12; Hemoglobin 12.8; Platelets 484 11/27/2017: BUN 15; Creat 1.02; Potassium 4.0; Sodium 141   Lipid Panel No results found for: CHOL, TRIG, HDL, CHOLHDL, VLDL, LDLCALC, LDLDIRECT  Other studies Reviewed: Additional studies/ records that were reviewed today with results demonstrating: .   ASSESSMENT AND PLAN:  1. CAD: No typical angina on medical therapy.  COntinue aggressive secondary prevention.  Refill NTG.  If she has sx that remind her of 2004, she will let us know.  Try a recumbent bike and gradually increase exercise.  2. Hyperlipidemia: LDL 61 in 1/18.  TG slightly increased.  INcrease exercise.  Reduce sugar intake as well.  Continue lipid lowering therapy.  3. HTN: COntrolled. Check at home on occasion.   4. Leg pain: Palpable LE pulses.  I don't think this is vascular in origin.   5. Murmur has progressed so will check an echo.    Current medicines are reviewed at length with the patient today.  The patient concerns regarding her medicines were addressed.  The following changes have been made:  No change  Labs/ tests ordered today include:  No orders of the defined types were placed in this encounter.   Recommend 150 minutes/week of aerobic exercise Low fat, low carb, high fiber diet recommended  Disposition:   FU in 1 year   Signed, Larae Grooms, MD  01/22/2018 9:08 AM    [facility] [address], [place], [place]  [address] Phone: [phone]; Fax: [phone]

## 2018-01-22 NOTE — Patient Instructions (Addendum)
Medication Instructions:  Your physician recommends that you continue on your current medications as directed. Please refer to the Current Medication list given to you today.   Labwork: None ordered  Testing/Procedures: Your physician has requested that you have an echocardiogram. Echocardiography is a painless test that uses sound waves to create images of your heart. It provides your doctor with information about the size and shape of your heart and how well your heart's chambers and valves are working. This procedure takes approximately one hour. There are no restrictions for this procedure.  Follow-Up: Your physician wants you to follow-up in: 1 year with Dr. Irish Lack. You will receive a reminder letter in the mail two months in advance. If you don't receive a letter, please call our office to schedule the follow-up appointment.   Any Other Special Instructions Will Be Listed Below (If Applicable).  Echocardiogram An echocardiogram, or echocardiography, uses sound waves (ultrasound) to produce an image of your heart. The echocardiogram is simple, painless, obtained within a short period of time, and offers valuable information to your health care provider. The images from an echocardiogram can provide information such as:  Evidence of coronary artery disease (CAD).  Heart size.  Heart muscle function.  Heart valve function.  Aneurysm detection.  Evidence of a past heart attack.  Fluid buildup around the heart.  Heart muscle thickening.  Assess heart valve function.  Tell a health care provider about:  Any allergies you have.  All medicines you are taking, including vitamins, herbs, eye drops, creams, and over-the-counter medicines.  Any problems you or family members have had with anesthetic medicines.  Any blood disorders you have.  Any surgeries you have had.  Any medical conditions you have.  Whether you are pregnant or may be pregnant. What happens before  the procedure? No special preparation is needed. Eat and drink normally. What happens during the procedure?  In order to produce an image of your heart, gel will be applied to your chest and a wand-like tool (transducer) will be moved over your chest. The gel will help transmit the sound waves from the transducer. The sound waves will harmlessly bounce off your heart to allow the heart images to be captured in real-time motion. These images will then be recorded.  You may need an IV to receive a medicine that improves the quality of the pictures. What happens after the procedure? You may return to your normal schedule including diet, activities, and medicines, unless your health care provider tells you otherwise. This information is not intended to replace advice given to you by your health care provider. Make sure you discuss any questions you have with your health care provider. Document Released: 05/13/2000 Document Revised: 01/02/2016 Document Reviewed: 01/21/2013 Elsevier Interactive Patient Education  2017 [place] Heart-healthy meal planning includes:  Limiting unhealthy fats.  Increasing healthy fats.  Making other small dietary changes.  You may need to talk with your doctor or a diet specialist (dietitian) to create an eating plan that is right for you. What types of fat should I choose?  Choose healthy fats. These include olive oil and canola oil, flaxseeds, walnuts, almonds, and seeds.  Eat more omega-3 fats. These include salmon, mackerel, sardines, tuna, flaxseed oil, and ground flaxseeds. Try to eat fish at least twice each week.  Limit saturated fats. ? Saturated fats are often found in animal products, such as meats, butter, and cream. ? Plant sources of saturated fats include palm  oil, palm kernel oil, and coconut oil.  Avoid foods with partially hydrogenated oils in them. These include stick margarine, some tub margarines,  cookies, crackers, and other baked goods. These contain trans fats. What general guidelines do I need to follow?  Check food labels carefully. Identify foods with trans fats or high amounts of saturated fat.  Fill one half of your plate with vegetables and green salads. Eat 4-5 servings of vegetables per day. A serving of vegetables is: ? 1 cup of raw leafy vegetables. ?  cup of raw or cooked cut-up vegetables. ?  cup of vegetable juice.  Fill one fourth of your plate with whole grains. Look for the word "whole" as the first word in the ingredient list.  Fill one fourth of your plate with lean protein foods.  Eat 4-5 servings of fruit per day. A serving of fruit is: ? One medium whole fruit. ?  cup of dried fruit. ?  cup of fresh, frozen, or canned fruit. ?  cup of 100% fruit juice.  Eat more foods that contain soluble fiber. These include apples, broccoli, carrots, beans, peas, and barley. Try to get 20-30 g of fiber per day.  Eat more home-cooked food. Eat less restaurant, buffet, and fast food.  Limit or avoid alcohol.  Limit foods high in starch and sugar.  Avoid fried foods.  Avoid frying your food. Try baking, boiling, grilling, or broiling it instead. You can also reduce fat by: ? Removing the skin from poultry. ? Removing all visible fats from meats. ? Skimming the fat off of stews, soups, and gravies before serving them. ? Steaming vegetables in water or broth.  Lose weight if you are overweight.  Eat 4-5 servings of nuts, legumes, and seeds per week: ? One serving of dried beans or legumes equals  cup after being cooked. ? One serving of nuts equals 1 ounces. ? One serving of seeds equals  ounce or one tablespoon.  You may need to keep track of how much salt or sodium you eat. This is especially true if you have high blood pressure. Talk with your doctor or dietitian to get more information. What foods can I eat? Grains Breads, including [place], white,  pita, wheat, raisin, rye, oatmeal, and [place]. Tortillas that are neither fried nor made with lard or trans fat. Low-fat rolls, including hotdog and hamburger buns and English muffins. Biscuits. Muffins. Waffles. Pancakes. Light popcorn. Whole-grain cereals. Flatbread. Melba toast. Pretzels. Breadsticks. Rusks. Low-fat snacks. Low-fat crackers, including oyster, saltine, matzo, graham, animal, and rye. Rice and pasta, including brown rice and pastas that are made with whole wheat. Vegetables All vegetables. Fruits All fruits, but limit coconut. Meats and Other Protein Sources Lean, well-trimmed beef, veal, pork, and lamb. Chicken and [place] without skin. All fish and shellfish. Wild duck, rabbit, pheasant, and venison. Egg whites or low-cholesterol egg substitutes. Dried beans, peas, lentils, and tofu. Seeds and most nuts. Dairy Low-fat or nonfat cheeses, including ricotta, string, and mozzarella. Skim or 1% milk that is liquid, powdered, or evaporated. Buttermilk that is made with low-fat milk. Nonfat or low-fat yogurt. Beverages Mineral water. Diet carbonated beverages. Sweets and Desserts Sherbets and fruit ices. Honey, jam, marmalade, jelly, and syrups. Meringues and gelatins. Pure sugar candy, such as hard candy, jelly beans, gumdrops, mints, marshmallows, and small amounts of dark chocolate. [employer] Eat all sweets and desserts in moderation. Fats and Oils Nonhydrogenated (trans-free) margarines. Vegetable oils, including soybean, sesame, sunflower, olive, peanut, safflower, corn,  canola, and cottonseed. Salad dressings or mayonnaise made with a vegetable oil. Limit added fats and oils that you use for cooking, baking, salads, and as spreads. Other Cocoa powder. Coffee and tea. All seasonings and condiments. The items listed above may not be a complete list of recommended foods or beverages. Contact your dietitian for more options. What foods are not recommended? Grains Breads  that are made with saturated or trans fats, oils, or whole milk. Croissants. Butter rolls. Cheese breads. Sweet rolls. Donuts. Buttered popcorn. Chow mein noodles. High-fat crackers, such as cheese or butter crackers. Meats and Other Protein Sources Fatty meats, such as hotdogs, short ribs, sausage, spareribs, bacon, rib eye roast or steak, and mutton. High-fat deli meats, such as salami and bologna. Caviar. Domestic duck and goose. Organ meats, such as kidney, liver, sweetbreads, and heart. Dairy Cream, sour cream, cream cheese, and creamed cottage cheese. Whole-milk cheeses, including blue (bleu), Monterey Jack, Hypericum, [place], American, [place], Swiss, cheddar, [place], and [place]. Whole or 2% milk that is liquid, evaporated, or condensed. Whole buttermilk. Cream sauce or high-fat cheese sauce. Yogurt that is made from whole milk. Beverages Regular sodas and juice drinks with added sugar. Sweets and Desserts Frosting. Pudding. Cookies. Cakes other than angel food cake. Candy that has milk chocolate or white chocolate, hydrogenated fat, butter, coconut, or unknown ingredients. Buttered syrups. Full-fat ice cream or ice cream drinks. Fats and Oils Gravy that has suet, meat fat, or shortening. Cocoa butter, hydrogenated oils, palm oil, coconut oil, palm kernel oil. These can often be found in baked products, candy, fried foods, nondairy creamers, and whipped toppings. Solid fats and shortenings, including bacon fat, salt pork, lard, and butter. Nondairy cream substitutes, such as coffee creamers and sour cream substitutes. Salad dressings that are made of unknown oils, cheese, or sour cream. The items listed above may not be a complete list of foods and beverages to avoid. Contact your dietitian for more information. This information is not intended to replace advice given to you by your health care provider. Make sure you discuss any questions you have with your health care provider. Document  Released: 11/15/2011 Document Revised: 10/22/2015 Document Reviewed: 11/07/2013 Elsevier Interactive Patient Education  [employer].   If you need a refill on your cardiac medications before your next appointment, please call your pharmacy.  r

## 2018-01-30 ENCOUNTER — Ambulatory Visit: Payer: Medicare Other | Admitting: Cardiology

## 2018-01-31 ENCOUNTER — Other Ambulatory Visit: Payer: Self-pay | Admitting: Physician Assistant

## 2018-01-31 ENCOUNTER — Other Ambulatory Visit: Payer: Self-pay | Admitting: Rheumatology

## 2018-01-31 DIAGNOSIS — Z79899 Other long term (current) drug therapy: Secondary | ICD-10-CM

## 2018-01-31 NOTE — Telephone Encounter (Signed)
Last visit: 10/31/2017 Next visit: 05/04/2018 Labs: BMP 11/27/2017  CBC, CMP 10/31/2017  Advised patient she is due to update labs. Patient verbalized understanding.   Okay to refill per Dr. Estanislado Pandy.

## 2018-02-02 ENCOUNTER — Other Ambulatory Visit: Payer: Self-pay

## 2018-02-02 ENCOUNTER — Ambulatory Visit (HOSPITAL_COMMUNITY): Payer: Medicare Other | Attending: Cardiovascular Disease

## 2018-02-02 DIAGNOSIS — Z79899 Other long term (current) drug therapy: Secondary | ICD-10-CM

## 2018-02-02 DIAGNOSIS — E785 Hyperlipidemia, unspecified: Secondary | ICD-10-CM | POA: Diagnosis not present

## 2018-02-02 DIAGNOSIS — I252 Old myocardial infarction: Secondary | ICD-10-CM | POA: Insufficient documentation

## 2018-02-02 DIAGNOSIS — E669 Obesity, unspecified: Secondary | ICD-10-CM | POA: Insufficient documentation

## 2018-02-02 DIAGNOSIS — E119 Type 2 diabetes mellitus without complications: Secondary | ICD-10-CM | POA: Insufficient documentation

## 2018-02-02 DIAGNOSIS — I119 Hypertensive heart disease without heart failure: Secondary | ICD-10-CM | POA: Diagnosis not present

## 2018-02-02 DIAGNOSIS — Z6832 Body mass index (BMI) 32.0-32.9, adult: Secondary | ICD-10-CM | POA: Insufficient documentation

## 2018-02-02 DIAGNOSIS — R011 Cardiac murmur, unspecified: Secondary | ICD-10-CM | POA: Insufficient documentation

## 2018-02-02 DIAGNOSIS — I2510 Atherosclerotic heart disease of native coronary artery without angina pectoris: Secondary | ICD-10-CM | POA: Insufficient documentation

## 2018-02-02 DIAGNOSIS — I083 Combined rheumatic disorders of mitral, aortic and tricuspid valves: Secondary | ICD-10-CM | POA: Insufficient documentation

## 2018-02-02 MED ORDER — PREDNISONE 1 MG PO TABS: 4.0000 mg | ORAL_TABLET | Freq: Every day | ORAL | 0 refills | Status: DC

## 2018-02-02 NOTE — Telephone Encounter (Signed)
Last visit: 10/31/2017 Next visit: 05/04/2018  Prednisone is at 9 mg. And tapering by 1 mg monthly.   Okay to refill per Dr. Estanislado Pandy

## 2018-02-03 LAB — CBC WITH DIFFERENTIAL/PLATELET
Basophils Absolute: 97 cells/uL (ref 0–200)
Basophils Relative: 0.6 %
Eosinophils Absolute: 243 cells/uL (ref 15–500)
Eosinophils Relative: 1.5 %
HCT: 42.1 % (ref 35.0–45.0)
Hemoglobin: 14 g/dL (ref 11.7–15.5)
Lymphs Abs: 2041 cells/uL (ref 850–3900)
MCH: 30.3 pg (ref 27.0–33.0)
MCHC: 33.3 g/dL (ref 32.0–36.0)
MCV: 91.1 fL (ref 80.0–100.0)
MPV: 10.3 fL (ref 7.5–12.5)
Monocytes Relative: 7.8 %
Neutro Abs: 12555 cells/uL — ABNORMAL HIGH (ref 1500–7800)
Neutrophils Relative %: 77.5 %
Platelets: 301 10*3/uL (ref 140–400)
RBC: 4.62 10*6/uL (ref 3.80–5.10)
RDW: 13.8 % (ref 11.0–15.0)
Total Lymphocyte: 12.6 %
WBC mixed population: 1264 cells/uL — ABNORMAL HIGH (ref 200–950)
WBC: 16.2 10*3/uL — ABNORMAL HIGH (ref 3.8–10.8)

## 2018-02-03 LAB — COMPLETE METABOLIC PANEL WITH GFR
AG Ratio: 1.5 (calc) (ref 1.0–2.5)
ALT: 10 U/L (ref 6–29)
AST: 12 U/L (ref 10–35)
Albumin: 4.3 g/dL (ref 3.6–5.1)
Alkaline phosphatase (APISO): 39 U/L (ref 33–130)
BUN/Creatinine Ratio: 17 (calc) (ref 6–22)
BUN: 18 mg/dL (ref 7–25)
CO2: 28 mmol/L (ref 20–32)
Calcium: 10.1 mg/dL (ref 8.6–10.4)
Chloride: 102 mmol/L (ref 98–110)
Creat: 1.06 mg/dL — ABNORMAL HIGH (ref 0.60–0.88)
GFR, Est African American: 57 mL/min/{1.73_m2} — ABNORMAL LOW (ref >=60)
GFR, Est Non African American: 49 mL/min/{1.73_m2} — ABNORMAL LOW (ref >=60)
Globulin: 2.9 g/dL (calc) (ref 1.9–3.7)
Glucose, Bld: 148 mg/dL — ABNORMAL HIGH (ref 65–99)
Potassium: 4 mmol/L (ref 3.5–5.3)
Sodium: 142 mmol/L (ref 135–146)
Total Bilirubin: 0.6 mg/dL (ref 0.2–1.2)
Total Protein: 7.2 g/dL (ref 6.1–8.1)

## 2018-02-07 DIAGNOSIS — I10 Essential (primary) hypertension: Secondary | ICD-10-CM | POA: Diagnosis not present

## 2018-02-07 DIAGNOSIS — R300 Dysuria: Secondary | ICD-10-CM | POA: Diagnosis not present

## 2018-02-07 DIAGNOSIS — E782 Mixed hyperlipidemia: Secondary | ICD-10-CM | POA: Diagnosis not present

## 2018-02-07 DIAGNOSIS — E119 Type 2 diabetes mellitus without complications: Secondary | ICD-10-CM | POA: Diagnosis not present

## 2018-02-07 DIAGNOSIS — I2510 Atherosclerotic heart disease of native coronary artery without angina pectoris: Secondary | ICD-10-CM | POA: Diagnosis not present

## 2018-02-07 DIAGNOSIS — Z794 Long term (current) use of insulin: Secondary | ICD-10-CM | POA: Diagnosis not present

## 2018-02-07 DIAGNOSIS — E1122 Type 2 diabetes mellitus with diabetic chronic kidney disease: Secondary | ICD-10-CM | POA: Diagnosis not present

## 2018-02-07 DIAGNOSIS — I252 Old myocardial infarction: Secondary | ICD-10-CM | POA: Diagnosis not present

## 2018-02-07 DIAGNOSIS — K219 Gastro-esophageal reflux disease without esophagitis: Secondary | ICD-10-CM | POA: Diagnosis not present

## 2018-02-07 DIAGNOSIS — N183 Chronic kidney disease, stage 3 (moderate): Secondary | ICD-10-CM | POA: Diagnosis not present

## 2018-02-07 DIAGNOSIS — M8580 Other specified disorders of bone density and structure, unspecified site: Secondary | ICD-10-CM | POA: Diagnosis not present

## 2018-02-09 ENCOUNTER — Encounter

## 2018-02-22 DIAGNOSIS — M5136 Other intervertebral disc degeneration, lumbar region: Secondary | ICD-10-CM | POA: Diagnosis not present

## 2018-02-22 DIAGNOSIS — M4726 Other spondylosis with radiculopathy, lumbar region: Secondary | ICD-10-CM | POA: Diagnosis not present

## 2018-02-22 DIAGNOSIS — M5126 Other intervertebral disc displacement, lumbar region: Secondary | ICD-10-CM | POA: Diagnosis not present

## 2018-02-22 DIAGNOSIS — Z7901 Long term (current) use of anticoagulants: Secondary | ICD-10-CM | POA: Diagnosis not present

## 2018-02-22 DIAGNOSIS — M48061 Spinal stenosis, lumbar region without neurogenic claudication: Secondary | ICD-10-CM | POA: Diagnosis not present

## 2018-02-22 DIAGNOSIS — M5416 Radiculopathy, lumbar region: Secondary | ICD-10-CM | POA: Diagnosis not present

## 2018-02-22 DIAGNOSIS — M5380 Other specified dorsopathies, site unspecified: Secondary | ICD-10-CM | POA: Diagnosis not present

## 2018-02-22 DIAGNOSIS — M545 Low back pain: Secondary | ICD-10-CM | POA: Diagnosis not present

## 2018-02-26 DIAGNOSIS — Z794 Long term (current) use of insulin: Secondary | ICD-10-CM | POA: Diagnosis not present

## 2018-02-26 DIAGNOSIS — E1122 Type 2 diabetes mellitus with diabetic chronic kidney disease: Secondary | ICD-10-CM | POA: Diagnosis not present

## 2018-02-26 DIAGNOSIS — I2510 Atherosclerotic heart disease of native coronary artery without angina pectoris: Secondary | ICD-10-CM | POA: Diagnosis not present

## 2018-02-26 DIAGNOSIS — F341 Dysthymic disorder: Secondary | ICD-10-CM | POA: Diagnosis not present

## 2018-02-26 DIAGNOSIS — I252 Old myocardial infarction: Secondary | ICD-10-CM | POA: Diagnosis not present

## 2018-02-26 DIAGNOSIS — I10 Essential (primary) hypertension: Secondary | ICD-10-CM | POA: Diagnosis not present

## 2018-02-26 DIAGNOSIS — E782 Mixed hyperlipidemia: Secondary | ICD-10-CM | POA: Diagnosis not present

## 2018-02-26 DIAGNOSIS — N183 Chronic kidney disease, stage 3 (moderate): Secondary | ICD-10-CM | POA: Diagnosis not present

## 2018-02-26 DIAGNOSIS — M069 Rheumatoid arthritis, unspecified: Secondary | ICD-10-CM | POA: Diagnosis not present

## 2018-03-01 ENCOUNTER — Other Ambulatory Visit: Payer: Self-pay | Admitting: Rheumatology

## 2018-03-01 MED ORDER — PREDNISONE 5 MG PO TABS: ORAL_TABLET | ORAL | 0 refills | Status: DC

## 2018-03-01 MED ORDER — PREDNISONE 1 MG PO TABS: 3.0000 mg | ORAL_TABLET | Freq: Every day | ORAL | 0 refills | Status: DC

## 2018-03-01 MED ORDER — METHOTREXATE SODIUM 2.5 MG PO TABS: ORAL_TABLET | ORAL | 0 refills | Status: DC

## 2018-03-01 NOTE — Telephone Encounter (Signed)
Patient is in [place], and needs a refill on Prednisone 5mg , and Prednisone 1mg . Patient currently taking 8mg  total. Also, will be out of MTX in 1 week. Please send all refills to [facility] in [place]. Ph [phone].

## 2018-03-01 NOTE — Telephone Encounter (Signed)
Last visit: 10/31/2017 Next visit: 05/04/2018 Labs: 02/02/18 Glucose is 148. Creatinine and GFR are stable. WBC is elevated. We will continue to monitor lab work.  Patient is tapering by 1 mg per month.- 8 mg this month.  Okay to refill per Dr. Estanislado Pandy

## 2018-03-09 ENCOUNTER — Other Ambulatory Visit: Payer: Self-pay | Admitting: Internal Medicine

## 2018-03-09 DIAGNOSIS — M8588 Other specified disorders of bone density and structure, other site: Secondary | ICD-10-CM

## 2018-03-20 DIAGNOSIS — I2510 Atherosclerotic heart disease of native coronary artery without angina pectoris: Secondary | ICD-10-CM | POA: Diagnosis not present

## 2018-03-20 DIAGNOSIS — Z23 Encounter for immunization: Secondary | ICD-10-CM | POA: Diagnosis not present

## 2018-03-20 DIAGNOSIS — E1122 Type 2 diabetes mellitus with diabetic chronic kidney disease: Secondary | ICD-10-CM | POA: Diagnosis not present

## 2018-03-20 DIAGNOSIS — N183 Chronic kidney disease, stage 3 (moderate): Secondary | ICD-10-CM | POA: Diagnosis not present

## 2018-03-20 DIAGNOSIS — E782 Mixed hyperlipidemia: Secondary | ICD-10-CM | POA: Diagnosis not present

## 2018-03-20 DIAGNOSIS — I10 Essential (primary) hypertension: Secondary | ICD-10-CM | POA: Diagnosis not present

## 2018-03-20 DIAGNOSIS — I252 Old myocardial infarction: Secondary | ICD-10-CM | POA: Diagnosis not present

## 2018-03-20 DIAGNOSIS — F341 Dysthymic disorder: Secondary | ICD-10-CM | POA: Diagnosis not present

## 2018-03-20 DIAGNOSIS — M069 Rheumatoid arthritis, unspecified: Secondary | ICD-10-CM | POA: Diagnosis not present

## 2018-04-02 ENCOUNTER — Telehealth: Payer: Self-pay | Admitting: Rheumatology

## 2018-04-02 MED ORDER — PREDNISONE 5 MG PO TABS: ORAL_TABLET | ORAL | 0 refills | Status: DC

## 2018-04-02 MED ORDER — PREDNISONE 1 MG PO TABS: 2.0000 mg | ORAL_TABLET | Freq: Every day | ORAL | 0 refills | Status: DC

## 2018-04-02 NOTE — Telephone Encounter (Signed)
Patient in [place], and needs a refill on Prednisone 1mg . And Prednisone 5mg  sent to [facility] phone # [phone].

## 2018-04-02 NOTE — Telephone Encounter (Signed)
Last visit: 10/31/2017 Next visit: 05/04/2018  Okay to refill per Dr. Estanislado Pandy

## 2018-04-18 DIAGNOSIS — I252 Old myocardial infarction: Secondary | ICD-10-CM | POA: Diagnosis not present

## 2018-04-18 DIAGNOSIS — E1122 Type 2 diabetes mellitus with diabetic chronic kidney disease: Secondary | ICD-10-CM | POA: Diagnosis not present

## 2018-04-18 DIAGNOSIS — I10 Essential (primary) hypertension: Secondary | ICD-10-CM | POA: Diagnosis not present

## 2018-04-18 DIAGNOSIS — M069 Rheumatoid arthritis, unspecified: Secondary | ICD-10-CM | POA: Diagnosis not present

## 2018-04-18 DIAGNOSIS — N183 Chronic kidney disease, stage 3 (moderate): Secondary | ICD-10-CM | POA: Diagnosis not present

## 2018-04-18 DIAGNOSIS — I2510 Atherosclerotic heart disease of native coronary artery without angina pectoris: Secondary | ICD-10-CM | POA: Diagnosis not present

## 2018-04-18 DIAGNOSIS — E782 Mixed hyperlipidemia: Secondary | ICD-10-CM | POA: Diagnosis not present

## 2018-04-18 DIAGNOSIS — F341 Dysthymic disorder: Secondary | ICD-10-CM | POA: Diagnosis not present

## 2018-04-20 NOTE — Progress Notes (Signed)
Office Visit Note  Patient: Sophia Ward             Date of Birth: [birth year]           MRN: [id number]             PCP: [name], MD Referring: [name], MD Visit Date: 05/04/2018 Occupation: @GUAROCC @  Subjective:  Right trochanteric bursitis   History of Present Illness: Sophia Ward is a 84 y.o. female with history of polymyalgia rheumatica, seropositive rheumatoid arthritis, and osteoarthritis.  She is currently on MTX 3 tablets by mouth once weekly, folic acid 1 mg by mouth daily, and prednisone 6 mg by mouth daily.  She denies any recent rheumatoid arthritis flares.  She has no joint swelling at this time.  She has chronic lower back pain,  She receives injections for lumbar stenosis by a physician in [place], [place].  She denies any muscle weakness at this time.  She denies any difficulty getting up from a chair.  She has right trochanteric bursitis that causes discomfort at night.      Activities of Daily Living:  Patient reports morning stiffness for 2  minutes.   Patient Reports nocturnal pain.  Difficulty dressing/grooming: Denies Difficulty climbing stairs: Reports Difficulty getting out of chair: Denies Difficulty using hands for taps, buttons, cutlery, and/or writing: Reports  Review of Systems  Constitutional: Positive for fatigue.  HENT: Positive for mouth dryness. Negative for mouth sores and nose dryness.   Eyes: Positive for dryness. Negative for pain and visual disturbance.  Respiratory: Negative for cough, hemoptysis, shortness of breath and difficulty breathing.   Cardiovascular: Negative for chest pain, palpitations, hypertension and swelling in legs/feet.  Gastrointestinal: Negative for blood in stool, constipation and diarrhea.  Endocrine: Negative for increased urination.  Genitourinary: Negative for painful urination.  Musculoskeletal: Positive for arthralgias, joint pain, muscle weakness and morning stiffness. Negative for joint  swelling, myalgias, muscle tenderness and myalgias.  Skin: Negative for color change, pallor, rash, hair loss, nodules/bumps, skin tightness, ulcers and sensitivity to sunlight.  Allergic/Immunologic: Negative for susceptible to infections.  Neurological: Negative for dizziness, numbness, headaches and weakness.  Hematological: Negative for swollen glands.  Psychiatric/Behavioral: Negative for depressed mood and sleep disturbance. The patient is not nervous/anxious.     PMFS History:  Patient Active Problem List   Diagnosis Date Noted  . Polymyalgia rheumatica ([place]) 11/25/2016  . Primary osteoarthritis of both hands 11/25/2016  . Bilateral primary osteoarthritis of knee 11/25/2016  . Osteopenia of multiple sites 11/25/2016  . 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 Osteoarthritis of lumbar spine 10/28/2016  . History of diabetes mellitus 10/27/2016  . History of gastroesophageal reflux (GERD) 10/27/2016  . Rheumatoid arthritis involving multiple sites with positive rheumatoid factor ([place]) 10/14/2016  . Elevated C-reactive protein (CRP) 10/14/2016  . Elevated sed rate 10/14/2016  . High risk medication use 10/14/2016  . Coronary atherosclerosis of native coronary artery 01/30/2014  . Mixed hyperlipidemia 01/30/2014  . Essential hypertension, benign 01/30/2014  . Obesity, unspecified 01/30/2014    Past Medical History:  Diagnosis Date  . Allergic rhinitis   . Anemia   . Arthritis    hands  . BPPV (benign paroxysmal positional vertigo)   . Coronary artery disease   . Coronary atherosclerosis of native coronary artery 01/30/2014  . Diabetes mellitus without complication ([place])   . Essential hypertension, benign 01/30/2014  . GERD (gastroesophageal reflux disease)   . Hypertension   . Mixed  hyperlipidemia 01/30/2014  . Myocardial infarction ([place]) 2004   mild MI-no damage- had stents  . Osteopenia   . Post-menopausal   . Stenosing tenosynovitis     Family History    Problem Relation Age of Onset  . CAD Mother   . Heart attack Mother   . CAD Father   . Heart Problems Brother        open heart surgery   . Arthritis Daughter        psoriatic arthritis    Past Surgical History:  Procedure Laterality Date  . ABDOMINAL HYSTERECTOMY    . APPENDECTOMY    . CARDIAC CATHETERIZATION  2004  . carpel tunnel    . COLONOSCOPY WITH PROPOFOL N/A 10/30/2012   Procedure: COLONOSCOPY WITH PROPOFOL;  Surgeon: Garlan Fair, MD;  Location: [facility] END[facility]COPY;  Service: Endoscopy;  Laterality: N/A;  . CORONARY ANGIOPLASTY  2004  . DILATION AND CURETTAGE OF UTERUS    . ESOPHAGOGASTRODUODENOSCOPY (EGD) WITH PROPOFOL N/A 10/30/2012   Procedure: ESOPHAGOGASTRODUODENOSCOPY (EGD) WITH PROPOFOL;  Surgeon: Garlan Fair, MD;  Location: [facility] ENDOSCOPY;  Service: Endoscopy;  Laterality: N/A;  . EYE SURGERY     bilateral cataract with lens implant  . EYE SURGERY    . left shouler surgery    . TONSILLECTOMY     Social History   Social History Narrative  . Not on file    Objective: Vital Signs: BP 134/73 (BP Location: Left Arm, Patient Position: Sitting, Cuff Size: Large)   Pulse 76   Resp 14   Ht 5' 2.5" (1.588 m)   Wt 179 lb (81.2 kg)   BMI 32.22 kg/m    Physical Exam  Constitutional: She is oriented to person, place, and time. She appears well-developed and well-nourished.  HENT:  Head: Normocephalic and atraumatic.  Eyes: Conjunctivae and EOM are normal.  Neck: Normal range of motion.  Cardiovascular: Normal rate, regular rhythm and intact distal pulses.  Murmur (Blowing, grade 3) heard. Pulmonary/Chest: Effort normal and breath sounds normal.  Abdominal: Soft. Bowel sounds are normal.  Lymphadenopathy:    She has no cervical adenopathy.  Neurological: She is alert and oriented to person, place, and time.  Skin: Skin is warm and dry. Capillary refill takes less than 2 seconds.  Psychiatric: She has a normal mood and affect. Her behavior is normal.  Nursing  note and vitals reviewed.    Musculoskeletal Exam: C-spine, thoracic spine, and lumbar spine good ROM.  No midline spinal tenderness.  Shoulder joints, elbow joints, MCPs, PIPs, and DIPs good ROM with no synovitis.  Limited wrist joint extension. PIP and DIP synovial thickening.  Hip joints, knee joints, ankle joints, MTPs, PIPs, and DIPs good ROM with no synovitis.  No warmth or effusion of knee joints.  No tenderness or swelling of ankle joints.  Tenderness of right trochanteric bursa.   CDAI Exam: CDAI Score: 0.4  Patient Global Assessment: 2 (mm); Provider Global Assessment: 2 (mm) Swollen: 0 ; Tender: 0  Joint Exam   Not documented   There is currently no information documented on the homunculus. Go to the Rheumatology activity and complete the homunculus joint exam.  Investigation: No additional findings.  Imaging: No results found.  Recent Labs: Lab Results  Component Value Date   WBC 16.2 (H) 02/02/2018   HGB 14.0 02/02/2018   PLT 301 02/02/2018   NA 142 02/02/2018   K 4.0 02/02/2018   CL 102 02/02/2018   CO2 28 02/02/2018   GLUCOSE  148 (H) 02/02/2018   BUN 18 02/02/2018   CREATININE 1.06 (H) 02/02/2018   BILITOT 0.6 02/02/2018   ALKPHOS 37 11/07/2016   AST 12 02/02/2018   ALT 10 02/02/2018   PROT 7.2 02/02/2018   ALBUMIN 4.2 11/07/2016   CALCIUM 10.1 02/02/2018   GFRAA 57 (L) 02/02/2018    Speciality Comments: No specialty comments available.  Procedures:  Large Joint Inj: R greater trochanter on 05/04/2018 10:12 AM Indications: pain Details: 27 G 1.5 in needle, lateral approach  Arthrogram: No  Medications: 1 mL lidocaine 1 %; 40 mg triamcinolone acetonide 40 MG/ML Aspirate: 0 mL Outcome: tolerated well, no immediate complications Procedure, treatment alternatives, risks and benefits explained, specific risks discussed. Consent was given by the patient. Immediately prior to procedure a time out was called to verify the correct patient, procedure,  equipment, support staff and site/side marked as required. Patient was prepped and draped in the usual sterile fashion.     Allergies: Codeine and Penicillins      Assessment / Plan:     Visit Diagnoses: Rheumatoid arthritis involving multiple sites with positive rheumatoid factor (HCC) - RF 309, ANA negative, CRP 5.8, synovitis, treated by Dr. Philbert Riser in [place]: She has no synovitis on exam.  She has not had any recent rheumatoid arthritis flares.  She is clinically doing well on Methotrexate 3 tablets by mouth once weekly and prednisone 6 mg by mouth daily.  She is tapering prednisone 1 mg every 1 month.  Refills of MTX and prednisone will be sent to the pharmacy.  She will continue on this current treatment regimen.  She was advised to notify us if she develops increased joint pain or joint swelling.  She will follow up in 5 months.    Polymyalgia rheumatica ([place]) - She is currently on prednisone 6 mg by mouth daily.  She is tapering by 1 mg every month.  She has not had any recent signs or symptoms of a flare.  She has no difficulty raising her arms above her head or getting up from a chair.  No increased muscle aches, muscle tenderness, or muscle weakness.  A refill of prednisone will be sent to the pharmacy.  She was advised to notify us if she develops signs or symptoms of a flare.   High risk medication use - MTX 3 tablets by mouth once weekly and folic acid 1 mg by mouth daily. CBC and CMP were drawn today to monitor for drug toxicity.  She will require lab work in March and then every 3 months. - Plan: COMPLETE METABOLIC PANEL WITH GFR, CBC with Differential/Platelet  Primary osteoarthritis of both hands: She has PIP and DIP synovial thickening.  Complete fist formation bilaterally.  No synovitis.  Joint protection and muscle strengthening were discussed.   Primary osteoarthritis of both knees: No warmth or effusion of knee joints.  She has good ROM with no discomfort.  She has  difficulty going up and down steps due to discomfort in both knees.   Primary osteoarthritis of both feet: She has no discomfort at this time.  She wears proper fitting shoes.   Osteoarthritis of lumbar spine, unspecified spinal osteoarthritis complication status: According to the patient she has injections on a regular basis for management of lumbar stenosis.  Her most recent injection was in November 2019.    Osteopenia of multiple sites: She takes a calcium and vitamin D supplement daily.   Trochanteric bursitis, right hip: She has tenderness of the  right trochanteric bursa on exam.  She requested a right trochanteric bursa cortisone injection today.  She tolerated the procedure well. Procedure note completed above.  She was advised to monitor blood pressure and blood glucose closely following the cortisone injection today. She was given a handout of exercises that she can perform at home.   Other medical conditions are listed as follows:   History of hyperlipidemia  History of diabetes mellitus  History of gastroesophageal reflux (GERD)  History of hypertension   Orders: Orders Placed This Encounter  Procedures  . Large Joint Inj  . COMPLETE METABOLIC PANEL WITH GFR  . CBC with Differential/Platelet   No orders of the defined types were placed in this encounter.     Follow-Up Instructions: Return in about 5 months (around 10/03/2018) for Rheumatoid arthritis.   Ofilia Neas, PA-C   I examined and evaluated the patient with Hazel Sams PA.  Patient has no synovitis on my examination.  She has been doing well on prednisone taper and low-dose methotrexate.  We will continue prednisone taper as planned.  She does have some discomfort underlying osteoarthritis.  The plan of care was discussed as noted above.  Bo Merino, MD  Note - This record has been created using Editor, commissioning.  Chart creation errors have been sought, but may not always  have been located. Such  creation errors do not reflect on  the standard of medical care.

## 2018-05-01 DIAGNOSIS — Z85828 Personal history of other malignant neoplasm of skin: Secondary | ICD-10-CM | POA: Diagnosis not present

## 2018-05-01 DIAGNOSIS — L570 Actinic keratosis: Secondary | ICD-10-CM | POA: Diagnosis not present

## 2018-05-01 DIAGNOSIS — L821 Other seborrheic keratosis: Secondary | ICD-10-CM | POA: Diagnosis not present

## 2018-05-01 DIAGNOSIS — L812 Freckles: Secondary | ICD-10-CM | POA: Diagnosis not present

## 2018-05-02 DIAGNOSIS — H35371 Puckering of macula, right eye: Secondary | ICD-10-CM | POA: Diagnosis not present

## 2018-05-02 DIAGNOSIS — H31092 Other chorioretinal scars, left eye: Secondary | ICD-10-CM | POA: Diagnosis not present

## 2018-05-02 DIAGNOSIS — H35352 Cystoid macular degeneration, left eye: Secondary | ICD-10-CM | POA: Diagnosis not present

## 2018-05-02 DIAGNOSIS — H35372 Puckering of macula, left eye: Secondary | ICD-10-CM | POA: Diagnosis not present

## 2018-05-04 ENCOUNTER — Encounter: Payer: Self-pay | Admitting: Rheumatology

## 2018-05-04 ENCOUNTER — Ambulatory Visit (INDEPENDENT_AMBULATORY_CARE_PROVIDER_SITE_OTHER): Payer: Medicare Other | Admitting: Rheumatology

## 2018-05-04 VITALS — BP 134/73 | HR 76 | Resp 14 | Ht 62.5 in | Wt 179.0 lb

## 2018-05-04 DIAGNOSIS — M8589 Other specified disorders of bone density and structure, multiple sites: Secondary | ICD-10-CM

## 2018-05-04 DIAGNOSIS — M7061 Trochanteric bursitis, right hip: Secondary | ICD-10-CM

## 2018-05-04 DIAGNOSIS — M47816 Spondylosis without myelopathy or radiculopathy, lumbar region: Secondary | ICD-10-CM

## 2018-05-04 DIAGNOSIS — Z79899 Other long term (current) drug therapy: Secondary | ICD-10-CM | POA: Diagnosis not present

## 2018-05-04 DIAGNOSIS — M47817 Spondylosis without myelopathy or radiculopathy, lumbosacral region: Secondary | ICD-10-CM

## 2018-05-04 DIAGNOSIS — Z8639 Personal history of other endocrine, nutritional and metabolic disease: Secondary | ICD-10-CM

## 2018-05-04 DIAGNOSIS — M353 Polymyalgia rheumatica: Secondary | ICD-10-CM

## 2018-05-04 DIAGNOSIS — Z8679 Personal history of other diseases of the circulatory system: Secondary | ICD-10-CM

## 2018-05-04 DIAGNOSIS — M19042 Primary osteoarthritis, left hand: Secondary | ICD-10-CM

## 2018-05-04 DIAGNOSIS — Z8719 Personal history of other diseases of the digestive system: Secondary | ICD-10-CM

## 2018-05-04 DIAGNOSIS — M19071 Primary osteoarthritis, right ankle and foot: Secondary | ICD-10-CM | POA: Diagnosis not present

## 2018-05-04 DIAGNOSIS — M19041 Primary osteoarthritis, right hand: Secondary | ICD-10-CM

## 2018-05-04 DIAGNOSIS — M0579 Rheumatoid arthritis with rheumatoid factor of multiple sites without organ or systems involvement: Secondary | ICD-10-CM | POA: Diagnosis not present

## 2018-05-04 DIAGNOSIS — M170 Bilateral primary osteoarthritis of knee: Secondary | ICD-10-CM

## 2018-05-04 DIAGNOSIS — M19072 Primary osteoarthritis, left ankle and foot: Secondary | ICD-10-CM

## 2018-05-04 DIAGNOSIS — I25119 Atherosclerotic heart disease of native coronary artery with unspecified angina pectoris: Secondary | ICD-10-CM

## 2018-05-04 MED ORDER — PREDNISONE 5 MG PO TABS: ORAL_TABLET | ORAL | 0 refills | Status: DC

## 2018-05-04 MED ORDER — PREDNISONE 1 MG PO TABS: 2.0000 mg | ORAL_TABLET | Freq: Every day | ORAL | 0 refills | Status: DC

## 2018-05-04 MED ORDER — TRIAMCINOLONE ACETONIDE 40 MG/ML IJ SUSP
40.0000 mg | INTRAMUSCULAR | Status: AC | PRN
  Administered 2018-05-04: 40 mg via INTRA_ARTICULAR

## 2018-05-04 MED ORDER — METHOTREXATE SODIUM 2.5 MG PO TABS: ORAL_TABLET | ORAL | 0 refills | Status: DC

## 2018-05-04 MED ORDER — LIDOCAINE HCL 1 % IJ SOLN
1.0000 mL | INTRAMUSCULAR | Status: AC | PRN
  Administered 2018-05-04: 1 mL

## 2018-05-04 NOTE — Patient Instructions (Addendum)
Standing Labs We placed an order today for your standing lab work.    Please come back and get your standing labs in March and every 3 months   CBC and CMP  We have open lab Monday through Friday from 8:30-11:30 AM and 1:30-4:00 PM  at the office of Dr. Bo Merino.   You may experience shorter wait times on Monday and Friday afternoons. The office is located at [address] [place], [place], [place] [address] No appointment is necessary.   Labs are drawn by [facility].  You may receive a bill from [facility] for your lab work. If you have any questions regarding directions or hours of operation,  please call [phone].   Just as a reminder please drink plenty of water prior to coming for your lab work. Thanks!    Trochanteric Bursitis Rehab Ask your health care provider which exercises are safe for you. Do exercises exactly as told by your health care provider and adjust them as directed. It is normal to feel mild stretching, pulling, tightness, or discomfort as you do these exercises, but you should stop right away if you feel sudden pain or your pain gets worse.Do not begin these exercises until told by your health care provider. Stretching exercises These exercises warm up your muscles and joints and improve the movement and flexibility of your hip. These exercises also help to relieve pain and stiffness. Exercise A: Iliotibial band stretch  1. Lie on your side with your left / right leg in the top position. 2. Bend your left / right knee and grab your ankle. 3. Slowly bring your knee back so your thigh is behind your body. 4. Slowly lower your knee toward the floor until you feel a gentle stretch on the outside of your left / right thigh. If you do not feel a stretch and your knee will not fall farther, place the heel of your other foot on top of your outer knee and pull your thigh down farther. 5. Hold this position for __________ seconds. 6. Slowly return to the starting  position. Repeat __________ times. Complete this exercise __________ times a day. Strengthening exercises These exercises build strength and endurance in your hip and pelvis. Endurance is the ability to use your muscles for a long time, even after they get tired. Exercise B: Bridge ( hip extensors) 1. Lie on your back on a firm surface with your knees bent and your feet flat on the floor. 2. Tighten your buttocks muscles and lift your buttocks off the floor until your trunk is level with your thighs. You should feel the muscles working in your buttocks and the back of your thighs. If this exercise is too easy, try doing it with your arms crossed over your chest. 3. Hold this position for __________ seconds. 4. Slowly return to the starting position. 5. Let your muscles relax completely between repetitions. Repeat __________ times. Complete this exercise __________ times a day. Exercise C: Squats ( knee extensors and  quadriceps) 1. Stand in front of a table, with your feet and knees pointing straight ahead. You may rest your hands on the table for balance but not for support. 2. Slowly bend your knees and lower your hips like you are going to sit in a chair. ? Keep your weight over your heels, not over your toes. ? Keep your lower legs upright so they are parallel with the table legs. ? Do not let your hips go lower than your knees. ?  Do not bend lower than told by your health care provider. ? If your hip pain increases, do not bend as low. 3. Hold this position for __________ seconds. 4. Slowly push with your legs to return to standing. Do not use your hands to pull yourself to standing. Repeat __________ times. Complete this exercise __________ times a day. Exercise D: Hip hike 1. Stand sideways on a bottom step. Stand on your left / right leg with your other foot unsupported next to the step. You can hold onto the railing or wall if needed for balance. 2. Keeping your knees straight and  your torso square, lift your left / right hip up toward the ceiling. 3. Hold this position for __________ seconds. 4. Slowly let your left / right hip lower toward the floor, past the starting position. Your foot should get closer to the floor. Do not lean or bend your knees. Repeat __________ times. Complete this exercise __________ times a day. Exercise E: Single leg stand 1. Stand near a counter or door frame that you can hold onto for balance as needed. It is helpful to stand in front of a mirror for this exercise so you can watch your hip. 2. Squeeze your left / right buttock muscles then lift up your other foot. Do not let your left / right hip push out to the side. 3. Hold this position for __________ seconds. Repeat __________ times. Complete this exercise __________ times a day. This information is not intended to replace advice given to you by your health care provider. Make sure you discuss any questions you have with your health care provider. Document Released: 06/23/2004 Document Revised: 01/21/2016 Document Reviewed: 05/01/2015 Elsevier Interactive Patient Education  [employer].

## 2018-05-05 LAB — CBC WITH DIFFERENTIAL/PLATELET
Basophils Absolute: 154 cells/uL (ref 0–200)
Basophils Relative: 0.7 %
Eosinophils Absolute: 616 cells/uL — ABNORMAL HIGH (ref 15–500)
Eosinophils Relative: 2.8 %
HCT: 40.2 % (ref 35.0–45.0)
Hemoglobin: 13.1 g/dL (ref 11.7–15.5)
Lymphs Abs: 2464 cells/uL (ref 850–3900)
MCH: 31 pg (ref 27.0–33.0)
MCHC: 32.6 g/dL (ref 32.0–36.0)
MCV: 95.3 fL (ref 80.0–100.0)
MPV: 10.1 fL (ref 7.5–12.5)
Monocytes Relative: 8.8 %
Neutro Abs: 16830 cells/uL — ABNORMAL HIGH (ref 1500–7800)
Neutrophils Relative %: 76.5 %
Platelets: 434 10*3/uL — ABNORMAL HIGH (ref 140–400)
RBC: 4.22 10*6/uL (ref 3.80–5.10)
RDW: 13.4 % (ref 11.0–15.0)
Total Lymphocyte: 11.2 %
WBC mixed population: 1936 cells/uL — ABNORMAL HIGH (ref 200–950)
WBC: 22 10*3/uL — ABNORMAL HIGH (ref 3.8–10.8)

## 2018-05-05 LAB — COMPLETE METABOLIC PANEL WITH GFR
AG Ratio: 1.3 (calc) (ref 1.0–2.5)
ALT: 9 U/L (ref 6–29)
AST: 12 U/L (ref 10–35)
Albumin: 3.8 g/dL (ref 3.6–5.1)
Alkaline phosphatase (APISO): 48 U/L (ref 33–130)
BUN/Creatinine Ratio: 13 (calc) (ref 6–22)
BUN: 14 mg/dL (ref 7–25)
CO2: 25 mmol/L (ref 20–32)
Calcium: 9.6 mg/dL (ref 8.6–10.4)
Chloride: 103 mmol/L (ref 98–110)
Creat: 1.06 mg/dL — ABNORMAL HIGH (ref 0.60–0.88)
GFR, Est African American: 57 mL/min/{1.73_m2} — ABNORMAL LOW (ref >=60)
GFR, Est Non African American: 49 mL/min/{1.73_m2} — ABNORMAL LOW (ref >=60)
Globulin: 3 g/dL (calc) (ref 1.9–3.7)
Glucose, Bld: 184 mg/dL — ABNORMAL HIGH (ref 65–99)
Potassium: 4.4 mmol/L (ref 3.5–5.3)
Sodium: 141 mmol/L (ref 135–146)
Total Bilirubin: 0.5 mg/dL (ref 0.2–1.2)
Total Protein: 6.8 g/dL (ref 6.1–8.1)

## 2018-05-07 ENCOUNTER — Other Ambulatory Visit: Payer: Self-pay

## 2018-05-07 DIAGNOSIS — Z79899 Other long term (current) drug therapy: Secondary | ICD-10-CM

## 2018-05-07 NOTE — Progress Notes (Signed)
Glucose is elevated at 184.  Creatinine and GFR are stable.  Please advise patient to avoid use of NSAIDs.   WBC count continues to trend up. Plts are elevated. She is currently on prednisone 6 mg po daily. She is tapering by 1 mg every month. Discussed labs with Dr. Estanislado Pandy and she would like the patient to have a CBC drawn in 1 month.

## 2018-05-10 DIAGNOSIS — E119 Type 2 diabetes mellitus without complications: Secondary | ICD-10-CM | POA: Diagnosis not present

## 2018-05-10 DIAGNOSIS — Z794 Long term (current) use of insulin: Secondary | ICD-10-CM | POA: Diagnosis not present

## 2018-05-10 DIAGNOSIS — I2510 Atherosclerotic heart disease of native coronary artery without angina pectoris: Secondary | ICD-10-CM | POA: Diagnosis not present

## 2018-05-15 ENCOUNTER — Ambulatory Visit
Admission: RE | Admit: 2018-05-15 | Discharge: 2018-05-15 | Disposition: A | Discharge status: Home / Self Care | Payer: Medicare Other | Source: Ambulatory Visit | Attending: Internal Medicine | Admitting: Internal Medicine

## 2018-05-15 DIAGNOSIS — Z780 Asymptomatic menopausal state: Secondary | ICD-10-CM | POA: Diagnosis not present

## 2018-05-15 DIAGNOSIS — M85852 Other specified disorders of bone density and structure, left thigh: Secondary | ICD-10-CM | POA: Diagnosis not present

## 2018-05-15 DIAGNOSIS — M8588 Other specified disorders of bone density and structure, other site: Secondary | ICD-10-CM

## 2018-05-18 DIAGNOSIS — K219 Gastro-esophageal reflux disease without esophagitis: Secondary | ICD-10-CM | POA: Diagnosis not present

## 2018-05-18 DIAGNOSIS — D72829 Elevated white blood cell count, unspecified: Secondary | ICD-10-CM | POA: Diagnosis not present

## 2018-05-18 DIAGNOSIS — M8588 Other specified disorders of bone density and structure, other site: Secondary | ICD-10-CM | POA: Diagnosis not present

## 2018-05-18 DIAGNOSIS — R05 Cough: Secondary | ICD-10-CM | POA: Diagnosis not present

## 2018-05-18 DIAGNOSIS — M069 Rheumatoid arthritis, unspecified: Secondary | ICD-10-CM | POA: Diagnosis not present

## 2018-05-18 DIAGNOSIS — I10 Essential (primary) hypertension: Secondary | ICD-10-CM | POA: Diagnosis not present

## 2018-05-18 DIAGNOSIS — I2510 Atherosclerotic heart disease of native coronary artery without angina pectoris: Secondary | ICD-10-CM | POA: Diagnosis not present

## 2018-05-18 DIAGNOSIS — N183 Chronic kidney disease, stage 3 (moderate): Secondary | ICD-10-CM | POA: Diagnosis not present

## 2018-05-18 DIAGNOSIS — M109 Gout, unspecified: Secondary | ICD-10-CM | POA: Diagnosis not present

## 2018-05-18 DIAGNOSIS — Z1389 Encounter for screening for other disorder: Secondary | ICD-10-CM | POA: Diagnosis not present

## 2018-05-18 DIAGNOSIS — Z0000 Encounter for general adult medical examination without abnormal findings: Secondary | ICD-10-CM | POA: Diagnosis not present

## 2018-05-18 DIAGNOSIS — I252 Old myocardial infarction: Secondary | ICD-10-CM | POA: Diagnosis not present

## 2018-05-18 DIAGNOSIS — M353 Polymyalgia rheumatica: Secondary | ICD-10-CM | POA: Diagnosis not present

## 2018-05-18 DIAGNOSIS — F341 Dysthymic disorder: Secondary | ICD-10-CM | POA: Diagnosis not present

## 2018-05-18 DIAGNOSIS — E1122 Type 2 diabetes mellitus with diabetic chronic kidney disease: Secondary | ICD-10-CM | POA: Diagnosis not present

## 2018-05-18 DIAGNOSIS — Z794 Long term (current) use of insulin: Secondary | ICD-10-CM | POA: Diagnosis not present

## 2018-05-18 DIAGNOSIS — E782 Mixed hyperlipidemia: Secondary | ICD-10-CM | POA: Diagnosis not present

## 2018-06-02 ENCOUNTER — Inpatient Hospital Stay: Payer: Medicare Other | Attending: Hematology | Admitting: Hematology

## 2018-06-02 VITALS — BP 162/66 | HR 85 | Temp 98.0°F | Resp 18 | Ht 62.0 in | Wt 176.9 lb

## 2018-06-02 DIAGNOSIS — D72828 Other elevated white blood cell count: Secondary | ICD-10-CM

## 2018-06-02 DIAGNOSIS — Z794 Long term (current) use of insulin: Secondary | ICD-10-CM | POA: Diagnosis not present

## 2018-06-02 DIAGNOSIS — R7982 Elevated C-reactive protein (CRP): Secondary | ICD-10-CM | POA: Insufficient documentation

## 2018-06-02 DIAGNOSIS — M129 Arthropathy, unspecified: Secondary | ICD-10-CM

## 2018-06-02 DIAGNOSIS — D72821 Monocytosis (symptomatic): Secondary | ICD-10-CM | POA: Diagnosis not present

## 2018-06-02 DIAGNOSIS — I10 Essential (primary) hypertension: Secondary | ICD-10-CM

## 2018-06-02 DIAGNOSIS — E782 Mixed hyperlipidemia: Secondary | ICD-10-CM

## 2018-06-02 DIAGNOSIS — E669 Obesity, unspecified: Secondary | ICD-10-CM | POA: Insufficient documentation

## 2018-06-02 DIAGNOSIS — I2510 Atherosclerotic heart disease of native coronary artery without angina pectoris: Secondary | ICD-10-CM | POA: Insufficient documentation

## 2018-06-02 DIAGNOSIS — R700 Elevated erythrocyte sedimentation rate: Secondary | ICD-10-CM | POA: Insufficient documentation

## 2018-06-02 DIAGNOSIS — I252 Old myocardial infarction: Secondary | ICD-10-CM | POA: Diagnosis not present

## 2018-06-02 DIAGNOSIS — E119 Type 2 diabetes mellitus without complications: Secondary | ICD-10-CM | POA: Diagnosis not present

## 2018-06-02 DIAGNOSIS — E785 Hyperlipidemia, unspecified: Secondary | ICD-10-CM

## 2018-06-02 DIAGNOSIS — Z79899 Other long term (current) drug therapy: Secondary | ICD-10-CM

## 2018-06-02 DIAGNOSIS — M8580 Other specified disorders of bone density and structure, unspecified site: Secondary | ICD-10-CM | POA: Diagnosis not present

## 2018-06-02 DIAGNOSIS — D729 Disorder of white blood cells, unspecified: Secondary | ICD-10-CM

## 2018-06-02 DIAGNOSIS — M353 Polymyalgia rheumatica: Secondary | ICD-10-CM | POA: Insufficient documentation

## 2018-06-02 DIAGNOSIS — K219 Gastro-esophageal reflux disease without esophagitis: Secondary | ICD-10-CM

## 2018-06-02 DIAGNOSIS — D72829 Elevated white blood cell count, unspecified: Secondary | ICD-10-CM

## 2018-06-02 NOTE — Progress Notes (Signed)
[facility]    HEMATOLOGY/ONCOLOGY CONSULTATION NOTE  Date of Service: 06/02/2018  Patient Care Team: [name], MD as PCP - General (Internal Medicine) Jettie Booze, MD as PCP - Cardiology (Cardiology)  CHIEF COMPLAINTS/PURPOSE OF CONSULTATION:  Elevated WBC counts  HISTORY OF PRESENTING ILLNESS:   Sophia Ward is a wonderful 85 y.o. female who has been referred to [place] by Dr .[name], MD for evaluation and management of leucocytosis.  Patient has a h/o RA on MTX and prednisone taper, CAD,htn who was referral for evaluation of leucocytosis. Patient notes that she has had elevated WBC counts for "years".  Based on available labs patient has had chronic neutrophilia and monocytosis since at least 10/2016 with WBC counts ranging form 14k to 22k. Recent labs on 12/6 showed WBC counts of 22k which has since improved to 14.1l on 06/05/2018. hgb is normal at 13.4 and platelets are normal at 331k.  She notes no fevers/chills/nightsweats/or unexpected weight loss. Has joint pain issues from RA.  No other acute new focal symptoms.   MEDICAL HISTORY:  Past Medical History:  Diagnosis Date  . Allergic rhinitis   . Anemia   . Arthritis    hands  . BPPV (benign paroxysmal positional vertigo)   . Coronary artery disease   . Coronary atherosclerosis of native coronary artery 01/30/2014  . Diabetes mellitus without complication ([place])   . Essential hypertension, benign 01/30/2014  . GERD (gastroesophageal reflux disease)   . Hypertension   . Mixed hyperlipidemia 01/30/2014  . Myocardial infarction ([place]) 2004   mild MI-no damage- had stents  . Osteopenia   . Post-menopausal   . Stenosing tenosynovitis     SURGICAL HISTORY: Past Surgical History:  Procedure Laterality Date  . ABDOMINAL HYSTERECTOMY    . APPENDECTOMY    . CARDIAC CATHETERIZATION  2004  . carpel tunnel    . COLONOSCOPY WITH PROPOFOL N/A 10/30/2012   Procedure: COLONOSCOPY WITH PROPOFOL;  Surgeon: Garlan Fair, MD;  Location: [facility];  Service: Endoscopy;  Laterality: N/A;  . CORONARY ANGIOPLASTY  2004  . DILATION AND CURETTAGE OF UTERUS    . ESOPHAGOGASTRODUODENOSCOPY (EGD) WITH PROPOFOL N/A 10/30/2012   Procedure: ESOPHAGOGASTRODUODENOSCOPY (EGD) WITH PROPOFOL;  Surgeon: Garlan Fair, MD;  Location: [facility];  Service: Endoscopy;  Laterality: N/A;  . EYE SURGERY     bilateral cataract with lens implant  . EYE SURGERY    . left shouler surgery    . TONSILLECTOMY      SOCIAL HISTORY: Social History   Socioeconomic History  . Marital status: Married    Spouse name: Not on file  . Number of children: Not on file  . Years of education: Not on file  . Highest education level: Not on file  Occupational History  . Not on file  Social Needs  . Financial resource strain: Not on file  . Food insecurity:    Worry: Not on file    Inability: Not on file  . Transportation needs:    Medical: Not on file    Non-medical: Not on file  Tobacco Use  . Smoking status: Never Smoker  . Smokeless tobacco: Never Used  Substance and Sexual Activity  . Alcohol use: No  . Drug use: No  . Sexual activity: Not on file  Lifestyle  . Physical activity:    Days per week: Not on file    Minutes per session: Not on file  . Stress: Not on file  Relationships  .  Social connections:    Talks on phone: Not on file    Gets together: Not on file    Attends religious service: Not on file    Active member of club or organization: Not on file    Attends meetings of clubs or organizations: Not on file    Relationship status: Not on file  . Intimate partner violence:    Fear of current or ex partner: Not on file    Emotionally abused: Not on file    Physically abused: Not on file    Forced sexual activity: Not on file  Other Topics Concern  . Not on file  Social History Narrative  . Not on file    FAMILY HISTORY: Family History  Problem Relation Age of Onset  . CAD Mother   . Heart  attack Mother   . CAD Father   . Heart Problems Brother        open heart surgery   . Arthritis Daughter        psoriatic arthritis     ALLERGIES:  is allergic to codeine and penicillins.  MEDICATIONS:  Current Outpatient Medications  Medication Sig Dispense Refill  . Acetaminophen (TYLENOL ARTHRITIS PAIN PO) Take by mouth as needed.    Marland Kitchen amLODipine (NORVASC) 5 MG tablet Take 10 mg by mouth every morning.     Marland Kitchen atorvastatin (LIPITOR) 40 MG tablet Take 40 mg by mouth at bedtime.    . Calcium Carbonate-Vitamin D (CALCIUM + D PO) Take 1 tablet by mouth every morning.    . Cholecalciferol (VITAMIN D3) 1000 UNITS CAPS Take 1,000 Units by mouth 2 (two) times daily.     . clopidogrel (PLAVIX) 75 MG tablet Take 75 mg by mouth daily.    . Coenzyme Q10 (COQ10) 200 MG CAPS Take by mouth daily.    Marland Kitchen desonide (DESOWEN) 0.05 % lotion Apply 1 application topically as needed.    . diclofenac sodium (VOLTAREN) 1 % GEL Apply 3 g to 3 large joints up to 3 times daily 3 Tube 3  . escitalopram (LEXAPRO) 5 MG tablet Take 5 mg by mouth daily.    . folic acid (FOLVITE) 1 MG tablet Take 1 tablet (1 mg total) by mouth daily. 90 tablet 3  . insulin lispro (HUMALOG KWIKPEN) 100 UNIT/ML KiwkPen Humalog KwikPen (U-100) Insulin 100 unit/mL subcutaneous  Injecting 7 at breakfast and 12 at lunch an dinner    . LEVEMIR FLEXTOUCH 100 UNIT/ML Pen 25 Units.   0  . methotrexate 2.5 MG tablet TAKE 3 TABLETS BY MOUTH ONCE A WEEK 36 tablet 0  . metoprolol succinate (TOPROL-XL) 100 MG 24 hr tablet Take 100 mg by mouth every morning.    . nitroGLYCERIN (NITROSTAT) 0.4 MG SL tablet DISSOLVE ONE TABLET UNDER THE TONGUE EVERY 5 MINUTES AS NEEDED FOR CHEST PAIN.  DO NOT EXCEED A TOTAL OF 3 DOSES IN 15 MINUTES 25 tablet 5  . Omega-3 Fatty Acids (FISH OIL PO) Take 1 capsule by mouth every morning.    . pantoprazole (PROTONIX) 40 MG tablet Take 40 mg by mouth daily after lunch.    . predniSONE (DELTASONE) 1 MG tablet Take 2 tablets  (2 mg total) by mouth daily with breakfast. 60 tablet 0  . predniSONE (DELTASONE) 5 MG tablet TAKE 1 TABLET BY MOUTH ONCE DAILY WITH BREAKFAST 30 tablet 0  . ramipril (ALTACE) 10 MG tablet Take 20 mg by mouth every morning.    . traMADol (ULTRAM) 50 MG  tablet Take by mouth every 6 (six) hours as needed.     No current facility-administered medications for this visit.     REVIEW OF SYSTEMS:    10 Point review of Systems was done is negative except as noted above.  PHYSICAL EXAMINATION: ECOG PERFORMANCE STATUS: 1 - Symptomatic but completely ambulatory  . Vitals:   06/02/18 1157  BP: (!) 162/66  Pulse: 85  Resp: 18  Temp: 98 F (36.7 C)  SpO2: 94%   Filed Weights   06/02/18 1157  Weight: 176 lb 14.4 oz (80.2 kg)   .Body mass index is 32.36 kg/m.  GENERAL:alert, in no acute distress and comfortable SKIN: no acute rashes, no significant lesions EYES: conjunctiva are pink and non-injected, sclera anicteric OROPHARYNX: MMM, no exudates, no oropharyngeal erythema or ulceration NECK: supple, no JVD LYMPH:  no palpable lymphadenopathy in the cervical, axillary or inguinal regions LUNGS: clear to auscultation b/l with normal respiratory effort HEART: regular rate & rhythm ABDOMEN:  normoactive bowel sounds , non tender, not distended. Extremity: no pedal edema PSYCH: alert & oriented x 3 with fluent speech NEURO: no focal motor/sensory deficits  LABORATORY DATA:  I have reviewed the data as listed  . CBC Latest Ref Rng & Units 06/05/2018 06/05/2018 05/04/2018  WBC 3.8 - 10.8 Thousand/uL 16.3(H) 14.1(H) 22.0(H)  Hemoglobin 11.7 - 15.5 g/dL 13.5 13.4 13.1  Hematocrit 35.0 - 45.0 % 40.0 41.6 40.2  Platelets 140 - 400 Thousand/uL 372 331 434(H)   . CBC    Component Value Date/Time   WBC 16.3 (H) 06/05/2018 1040   RBC 4.34 06/05/2018 1040   HGB 13.5 06/05/2018 1040   HCT 40.0 06/05/2018 1040   PLT 372 06/05/2018 1040   MCV 92.2 06/05/2018 1040   MCH 31.1 06/05/2018 1040     MCHC 33.8 06/05/2018 1040   RDW 13.2 06/05/2018 1040   LYMPHSABS 2,249 06/05/2018 1040   MONOABS 1.5 (H) 06/05/2018 0853   EOSABS 408 06/05/2018 1040   BASOSABS 130 06/05/2018 1040    . CMP Latest Ref Rng & Units 05/04/2018 02/02/2018 11/27/2017  Glucose 65 - 99 mg/dL 184(H) 148(H) 151(H)  BUN 7 - 25 mg/dL 14 18 15   Creatinine 0.60 - 0.88 mg/dL 1.06(H) 1.06(H) 1.02(H)  Sodium 135 - 146 mmol/L 141 142 141  Potassium 3.5 - 5.3 mmol/L 4.4 4.0 4.0  Chloride 98 - 110 mmol/L 103 102 103  CO2 20 - 32 mmol/L 25 28 31   Calcium 8.6 - 10.4 mg/dL 9.6 10.1 9.8  Total Protein 6.1 - 8.1 g/dL 6.8 7.2 -  Total Bilirubin 0.2 - 1.2 mg/dL 0.5 0.6 -  Alkaline Phos 33 - 130 U/L - - -  AST 10 - 35 U/L 12 12 -  ALT 6 - 29 U/L 9 10 -   . Lab Results  Component Value Date   LDH 233 (H) 06/05/2018   Sed rate 14    RADIOGRAPHIC STUDIES: I have personally reviewed the radiological images as listed and agreed with the findings in the report. Dg Bone Density (dxa)  Result Date: 05/15/2018 EXAM: DUAL X-RAY ABSORPTIOMETRY (DXA) FOR BONE MINERAL DENSITY IMPRESSION: Referring Physician:  JEFFREY KERR Your patient completed a BMD test using Lunar IDXA DXA system ( analysis version: 16 ) manufactured by [employer]. Technologist:AW PATIENT: Name: Preet, Mangano Patient ID: [id number] Birth Date: Jun 05, 1932 Height: 62.0 in. Sex: Female Measured: 05/15/2018 Weight: 175.8 lbs. Indications: Advanced Age, Caucasian, Estrogen Deficient, History of Osteopenia, Insulin for Diabetes, osteoarthiritis, Postmenopausal,  Rheumatoid Arthritis (714.0) Fractures: None Treatments: Calcium (E943.0), Vitamin D (E933.5) ASSESSMENT: The BMD measured at Femur Neck Left is 0.820 g/cm2 with a T-score of -1.6. This patient is considered osteopenic according to [facility]) criteria. The scan quality is good. Site Region Measured Date Measured Age [age]A T-score BMD Significant CHANGE DualFemur Neck Left 05/15/2018 82.9  -1.6 0.820 g/cm2 AP Spine L1-L4 05/15/2018 82.9 1.1 1.309 g/cm2 DualFemur Total Mean 05/15/2018 82.9 -0.4 0.955 g/cm2 World Health Organization [facility]) criteria for post-menopausal, Caucasian Women: Normal T-score at or above -1 SD Osteopenia T-score between -1 and -2.5 SD Osteoporosis T-score at or below -2.5 SD RECOMMENDATION: 1. All patients should optimize calcium and vitamin D intake. 2. Consider FDA approved medical therapies in postmenopausal women and men aged 33 years and older, based on the following: a. A hip or vertebral (clinical or morphometric) fracture b. T- score < or = -2.5 at the femoral neck or spine after appropriate evaluation to exclude secondary causes c. Low bone mass (T-score between -1.0 and -2.5 at the femoral neck or spine) and a 10 year probability of a hip fracture > or = 3% or a 10 year probability of a major osteoporosis-related fracture > or = 20% based on the [place]-adapted WHO algorithm d. Clinician judgment and/or patient preferences may indicate treatment for people with 10-year fracture probabilities above or below these levels FOLLOW-UP: People with diagnosed cases of osteoporosis or at high risk for fracture should have regular bone mineral density tests. For patients eligible for Medicare, routine testing is allowed once every 2 years. The testing frequency can be increased to one year for patients who have rapidly progressing disease, those who are receiving or discontinuing medical therapy to restore bone mass, or have additional risk factors. I have reviewed this report and agree with the above findings. [facility] FRAX* 10-year Probability of Fracture Based on femoral neck BMD: DualFemur (Left) Major Osteoporotic Fracture: 17.0% Hip Fracture: 4.9% Population: [place] (Caucasian) Risk Factors: Rheumatoid Arthritis (714.0) *FRAX is a Materials engineer of the [employer] for Metabolic Bone Disease, a [place] (WHO) [employer]. ASSESSMENT: The probability of a major osteoporotic fracture is 17.0% within the next ten years. The probability of hip fracture is  4.9% within the next 10 years. Electronically Signed   By: Rolm Baptise M.D.   On: 05/15/2018 10:43    ASSESSMENT & PLAN:   85 yo with   1) Chronic leucocytosis from atleast 10/2016 in the 14-22k range. Primarily neutrophilia and monocytosis. Cannot r/o CMML. R/o CML (less likely) Reactive process due to inflammation from RA Does on report any recent fevers or fevers. PLAN Discussed lab results with patient. -discussed diagnostic possibilities with the patient -labs ordered for further evaluation -clonal markers to r/o a clonal BM disorder. . Orders Placed This Encounter  Procedures  . CBC with Differential/Platelet    Standing Status:   Future    Number of Occurrences:   1    Standing Expiration Date:   07/07/2019  . CMP ([place] only)    Standing Status:   Future    Number of Occurrences:   1    Standing Expiration Date:   06/03/2019  . BCR ABL1 FISH ([facility])    Standing Status:   Future    Number of Occurrences:   1    Standing Expiration Date:   06/03/2019  . JAK2 (including V617F and Exon 12), MPL, and CALR-Next Generation Sequencing  Standing Status:   Future    Number of Occurrences:   1    Standing Expiration Date:   06/02/2019  . Sedimentation rate    Standing Status:   Future    Number of Occurrences:   1    Standing Expiration Date:   06/03/2019  . Lactate dehydrogenase    Standing Status:   Future    Number of Occurrences:   1    Standing Expiration Date:   06/03/2019    2)  Patient Active Problem List   Diagnosis Date Noted  . Polymyalgia rheumatica ([place]) 11/25/2016  . Primary osteoarthritis of both hands 11/25/2016  . Bilateral primary osteoarthritis of knee 11/25/2016  . Osteopenia of multiple sites 11/25/2016  . 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 Osteoarthritis of lumbar spine 10/28/2016  .  History of diabetes mellitus 10/27/2016  . History of gastroesophageal reflux (GERD) 10/27/2016  . Rheumatoid arthritis involving multiple sites with positive rheumatoid factor ([place]) 10/14/2016  . Elevated C-reactive protein (CRP) 10/14/2016  . Elevated sed rate 10/14/2016  . High risk medication use 10/14/2016  . Coronary atherosclerosis of native coronary artery 01/30/2014  . Mixed hyperlipidemia 01/30/2014  . Essential hypertension, benign 01/30/2014  . Obesity, unspecified 01/30/2014  -continue mx with PCP.   All of the patients questions were answered with apparent satisfaction. The patient knows to call the clinic with any problems, questions or concerns.  I spent 35 minutes counseling the patient face to face. The total time spent in the appointment was 45 minutes and more than 50% was on counseling and direct patient cares.    Sullivan Lone MD [place] AAHIVMS [facility] Hematology/Oncology Physician [facility]  (Office):       [phone] (Work cell):  [phone] (Fax):           [phone]  06/02/2018 12:04 PM

## 2018-06-04 ENCOUNTER — Telehealth: Payer: Self-pay

## 2018-06-04 NOTE — Telephone Encounter (Signed)
Spoke with patient concerning her upcoming appointment. Will pick up calender on 4/7 if not will mail follow up appointment to current address on file. Per 1/4 los

## 2018-06-05 ENCOUNTER — Other Ambulatory Visit: Payer: Self-pay

## 2018-06-05 ENCOUNTER — Other Ambulatory Visit: Payer: Self-pay | Admitting: *Deleted

## 2018-06-05 ENCOUNTER — Inpatient Hospital Stay: Payer: Medicare Other

## 2018-06-05 DIAGNOSIS — D729 Disorder of white blood cells, unspecified: Secondary | ICD-10-CM | POA: Diagnosis not present

## 2018-06-05 DIAGNOSIS — D72829 Elevated white blood cell count, unspecified: Secondary | ICD-10-CM | POA: Diagnosis not present

## 2018-06-05 DIAGNOSIS — R7982 Elevated C-reactive protein (CRP): Secondary | ICD-10-CM | POA: Diagnosis not present

## 2018-06-05 DIAGNOSIS — Z79899 Other long term (current) drug therapy: Secondary | ICD-10-CM

## 2018-06-05 DIAGNOSIS — R700 Elevated erythrocyte sedimentation rate: Secondary | ICD-10-CM | POA: Diagnosis not present

## 2018-06-05 DIAGNOSIS — M353 Polymyalgia rheumatica: Secondary | ICD-10-CM | POA: Diagnosis not present

## 2018-06-05 DIAGNOSIS — M129 Arthropathy, unspecified: Secondary | ICD-10-CM | POA: Diagnosis not present

## 2018-06-05 DIAGNOSIS — D72821 Monocytosis (symptomatic): Secondary | ICD-10-CM | POA: Diagnosis not present

## 2018-06-05 DIAGNOSIS — I2510 Atherosclerotic heart disease of native coronary artery without angina pectoris: Secondary | ICD-10-CM | POA: Diagnosis not present

## 2018-06-05 LAB — CBC WITH DIFFERENTIAL/PLATELET
Abs Immature Granulocytes: 0.07 10*3/uL (ref 0.00–0.07)
Basophils Absolute: 0.1 10*3/uL (ref 0.0–0.1)
Basophils Relative: 1 %
Eosinophils Absolute: 0.4 10*3/uL (ref 0.0–0.5)
Eosinophils Relative: 3 %
HCT: 41.6 % (ref 36.0–46.0)
Hemoglobin: 13.4 g/dL (ref 12.0–15.0)
Immature Granulocytes: 1 %
Lymphocytes Relative: 21 %
Lymphs Abs: 2.9 10*3/uL (ref 0.7–4.0)
MCH: 30.5 pg (ref 26.0–34.0)
MCHC: 32.2 g/dL (ref 30.0–36.0)
MCV: 94.5 fL (ref 80.0–100.0)
Monocytes Absolute: 1.5 10*3/uL — ABNORMAL HIGH (ref 0.1–1.0)
Monocytes Relative: 11 %
Neutro Abs: 9.1 10*3/uL — ABNORMAL HIGH (ref 1.7–7.7)
Neutrophils Relative %: 63 %
Platelets: 331 10*3/uL (ref 150–400)
RBC: 4.4 MIL/uL (ref 3.87–5.11)
RDW: 14.1 % (ref 11.5–15.5)
WBC: 14.1 10*3/uL — ABNORMAL HIGH (ref 4.0–10.5)
nRBC: 0 % (ref 0.0–0.2)

## 2018-06-05 LAB — CMP (CANCER CENTER ONLY)
ALT: 12 U/L (ref 0–44)
AST: 10 U/L — ABNORMAL LOW (ref 15–41)
Albumin: 3.4 g/dL — ABNORMAL LOW (ref 3.5–5.0)
Alkaline Phosphatase: 45 U/L (ref 38–126)
Anion gap: 13 (ref 5–15)
BUN: 15 mg/dL (ref 8–23)
CO2: 22 mmol/L (ref 22–32)
Calcium: 9.4 mg/dL (ref 8.9–10.3)
Chloride: 108 mmol/L (ref 98–111)
Creatinine: 1.07 mg/dL — ABNORMAL HIGH (ref 0.44–1.00)
GFR, Est AFR Am: 56 mL/min — ABNORMAL LOW (ref >60)
GFR, Estimated: 48 mL/min — ABNORMAL LOW (ref >60)
Glucose, Bld: 123 mg/dL — ABNORMAL HIGH (ref 70–99)
Potassium: 3.3 mmol/L — ABNORMAL LOW (ref 3.5–5.1)
Sodium: 143 mmol/L (ref 135–145)
Total Bilirubin: 0.6 mg/dL (ref 0.3–1.2)
Total Protein: 7.6 g/dL (ref 6.5–8.1)

## 2018-06-05 LAB — SEDIMENTATION RATE: Sed Rate: 45 mm/hr — ABNORMAL HIGH (ref 0–22)

## 2018-06-05 LAB — SAVE SMEAR(SSMR), FOR PROVIDER SLIDE REVIEW

## 2018-06-05 LAB — LACTATE DEHYDROGENASE: LDH: 233 U/L — ABNORMAL HIGH (ref 98–192)

## 2018-06-06 LAB — COMPLETE METABOLIC PANEL WITH GFR
AG Ratio: 1.2 (calc) (ref 1.0–2.5)
ALT: 10 U/L (ref 6–29)
AST: 11 U/L (ref 10–35)
Albumin: 4 g/dL (ref 3.6–5.1)
Alkaline phosphatase (APISO): 43 U/L (ref 33–130)
BUN/Creatinine Ratio: 17 (calc) (ref 6–22)
BUN: 17 mg/dL (ref 7–25)
CO2: 27 mmol/L (ref 20–32)
Calcium: 9.5 mg/dL (ref 8.6–10.4)
Chloride: 105 mmol/L (ref 98–110)
Creat: 0.98 mg/dL — ABNORMAL HIGH (ref 0.60–0.88)
GFR, Est African American: 62 mL/min/{1.73_m2} (ref >=60)
GFR, Est Non African American: 53 mL/min/{1.73_m2} — ABNORMAL LOW (ref >=60)
Globulin: 3.3 g/dL (calc) (ref 1.9–3.7)
Glucose, Bld: 119 mg/dL — ABNORMAL HIGH (ref 65–99)
Potassium: 3.9 mmol/L (ref 3.5–5.3)
Sodium: 142 mmol/L (ref 135–146)
Total Bilirubin: 0.5 mg/dL (ref 0.2–1.2)
Total Protein: 7.3 g/dL (ref 6.1–8.1)

## 2018-06-06 LAB — CBC WITH DIFFERENTIAL/PLATELET
Absolute Monocytes: 1907 cells/uL — ABNORMAL HIGH (ref 200–950)
Basophils Absolute: 130 cells/uL (ref 0–200)
Basophils Relative: 0.8 %
Eosinophils Absolute: 408 cells/uL (ref 15–500)
Eosinophils Relative: 2.5 %
HCT: 40 % (ref 35.0–45.0)
Hemoglobin: 13.5 g/dL (ref 11.7–15.5)
Lymphs Abs: 2249 cells/uL (ref 850–3900)
MCH: 31.1 pg (ref 27.0–33.0)
MCHC: 33.8 g/dL (ref 32.0–36.0)
MCV: 92.2 fL (ref 80.0–100.0)
MPV: 10.1 fL (ref 7.5–12.5)
Monocytes Relative: 11.7 %
Neutro Abs: 11606 cells/uL — ABNORMAL HIGH (ref 1500–7800)
Neutrophils Relative %: 71.2 %
Platelets: 372 10*3/uL (ref 140–400)
RBC: 4.34 10*6/uL (ref 3.80–5.10)
RDW: 13.2 % (ref 11.0–15.0)
Total Lymphocyte: 13.8 %
WBC: 16.3 10*3/uL — ABNORMAL HIGH (ref 3.8–10.8)

## 2018-06-06 NOTE — Progress Notes (Signed)
Labs are stable.

## 2018-06-14 DIAGNOSIS — N390 Urinary tract infection, site not specified: Secondary | ICD-10-CM | POA: Diagnosis not present

## 2018-06-14 DIAGNOSIS — B9620 Unspecified Escherichia coli [E. coli] as the cause of diseases classified elsewhere: Secondary | ICD-10-CM | POA: Diagnosis not present

## 2018-06-22 LAB — JAK2 (INCLUDING V617F AND EXON 12), MPL,& CALR-NEXT GEN SEQ

## 2018-06-22 LAB — BCR ABL1 FISH (GENPATH)

## 2018-06-23 DIAGNOSIS — N390 Urinary tract infection, site not specified: Secondary | ICD-10-CM | POA: Diagnosis not present

## 2018-06-25 DIAGNOSIS — M4726 Other spondylosis with radiculopathy, lumbar region: Secondary | ICD-10-CM | POA: Diagnosis not present

## 2018-06-25 DIAGNOSIS — M5416 Radiculopathy, lumbar region: Secondary | ICD-10-CM | POA: Diagnosis not present

## 2018-06-25 DIAGNOSIS — M5126 Other intervertebral disc displacement, lumbar region: Secondary | ICD-10-CM | POA: Diagnosis not present

## 2018-06-25 DIAGNOSIS — M545 Low back pain: Secondary | ICD-10-CM | POA: Diagnosis not present

## 2018-06-25 DIAGNOSIS — Z7901 Long term (current) use of anticoagulants: Secondary | ICD-10-CM | POA: Diagnosis not present

## 2018-06-25 DIAGNOSIS — M5380 Other specified dorsopathies, site unspecified: Secondary | ICD-10-CM | POA: Diagnosis not present

## 2018-06-25 DIAGNOSIS — M5136 Other intervertebral disc degeneration, lumbar region: Secondary | ICD-10-CM | POA: Diagnosis not present

## 2018-06-25 DIAGNOSIS — M48061 Spinal stenosis, lumbar region without neurogenic claudication: Secondary | ICD-10-CM | POA: Diagnosis not present

## 2018-07-05 ENCOUNTER — Telehealth: Payer: Self-pay | Admitting: Rheumatology

## 2018-07-05 MED ORDER — PREDNISONE 1 MG PO TABS: 2.0000 mg | ORAL_TABLET | Freq: Every day | ORAL | 0 refills | Status: DC

## 2018-07-05 NOTE — Telephone Encounter (Signed)
Last Visit: 05/04/18 Next Visit: 10/17/18  Okay to refill per Dr. Estanislado Pandy

## 2018-07-05 NOTE — Telephone Encounter (Signed)
Patient called requesting prescription refill of Prednisone 1 mg tablets sent to [facility] on [address] in [place], [place]

## 2018-07-17 ENCOUNTER — Other Ambulatory Visit: Payer: Self-pay

## 2018-07-17 MED ORDER — PREDNISONE 1 MG PO TABS: 4.0000 mg | ORAL_TABLET | Freq: Every day | ORAL | 0 refills | Status: DC

## 2018-07-17 NOTE — Progress Notes (Signed)
Received refill clarification request via fax.   Patient is supposed to be on 4mg  of prednisone daily (patient is tapering by 1mg  each month).   Last visit: 05/04/2018 Next visit: 10/17/2018  Okay to refill per Dr. Estanislado Pandy.

## 2018-07-18 DIAGNOSIS — I2510 Atherosclerotic heart disease of native coronary artery without angina pectoris: Secondary | ICD-10-CM | POA: Diagnosis not present

## 2018-07-18 DIAGNOSIS — E782 Mixed hyperlipidemia: Secondary | ICD-10-CM | POA: Diagnosis not present

## 2018-07-18 DIAGNOSIS — N183 Chronic kidney disease, stage 3 (moderate): Secondary | ICD-10-CM | POA: Diagnosis not present

## 2018-07-18 DIAGNOSIS — F341 Dysthymic disorder: Secondary | ICD-10-CM | POA: Diagnosis not present

## 2018-07-18 DIAGNOSIS — I252 Old myocardial infarction: Secondary | ICD-10-CM | POA: Diagnosis not present

## 2018-07-18 DIAGNOSIS — I10 Essential (primary) hypertension: Secondary | ICD-10-CM | POA: Diagnosis not present

## 2018-07-18 DIAGNOSIS — M069 Rheumatoid arthritis, unspecified: Secondary | ICD-10-CM | POA: Diagnosis not present

## 2018-07-18 DIAGNOSIS — E1122 Type 2 diabetes mellitus with diabetic chronic kidney disease: Secondary | ICD-10-CM | POA: Diagnosis not present

## 2018-07-25 DIAGNOSIS — B9620 Unspecified Escherichia coli [E. coli] as the cause of diseases classified elsewhere: Secondary | ICD-10-CM | POA: Diagnosis not present

## 2018-07-25 DIAGNOSIS — N390 Urinary tract infection, site not specified: Secondary | ICD-10-CM | POA: Diagnosis not present

## 2018-08-03 DIAGNOSIS — Z79899 Other long term (current) drug therapy: Secondary | ICD-10-CM | POA: Diagnosis not present

## 2018-08-03 DIAGNOSIS — E1122 Type 2 diabetes mellitus with diabetic chronic kidney disease: Secondary | ICD-10-CM | POA: Diagnosis not present

## 2018-08-03 DIAGNOSIS — F341 Dysthymic disorder: Secondary | ICD-10-CM | POA: Diagnosis not present

## 2018-08-03 DIAGNOSIS — I2510 Atherosclerotic heart disease of native coronary artery without angina pectoris: Secondary | ICD-10-CM | POA: Diagnosis not present

## 2018-08-03 DIAGNOSIS — M069 Rheumatoid arthritis, unspecified: Secondary | ICD-10-CM | POA: Diagnosis not present

## 2018-08-03 DIAGNOSIS — I10 Essential (primary) hypertension: Secondary | ICD-10-CM | POA: Diagnosis not present

## 2018-08-03 DIAGNOSIS — E782 Mixed hyperlipidemia: Secondary | ICD-10-CM | POA: Diagnosis not present

## 2018-08-03 DIAGNOSIS — I252 Old myocardial infarction: Secondary | ICD-10-CM | POA: Diagnosis not present

## 2018-08-03 DIAGNOSIS — N183 Chronic kidney disease, stage 3 (moderate): Secondary | ICD-10-CM | POA: Diagnosis not present

## 2018-08-03 DIAGNOSIS — Z794 Long term (current) use of insulin: Secondary | ICD-10-CM | POA: Diagnosis not present

## 2018-08-06 DIAGNOSIS — N390 Urinary tract infection, site not specified: Secondary | ICD-10-CM | POA: Diagnosis not present

## 2018-08-08 DIAGNOSIS — Z08 Encounter for follow-up examination after completed treatment for malignant neoplasm: Secondary | ICD-10-CM | POA: Diagnosis not present

## 2018-08-08 DIAGNOSIS — Z85828 Personal history of other malignant neoplasm of skin: Secondary | ICD-10-CM | POA: Diagnosis not present

## 2018-08-08 DIAGNOSIS — C44329 Squamous cell carcinoma of skin of other parts of face: Secondary | ICD-10-CM | POA: Diagnosis not present

## 2018-08-08 DIAGNOSIS — D485 Neoplasm of uncertain behavior of skin: Secondary | ICD-10-CM | POA: Diagnosis not present

## 2018-08-08 DIAGNOSIS — Z23 Encounter for immunization: Secondary | ICD-10-CM | POA: Diagnosis not present

## 2018-08-09 ENCOUNTER — Telehealth: Payer: Self-pay | Admitting: Rheumatology

## 2018-08-09 DIAGNOSIS — M0579 Rheumatoid arthritis with rheumatoid factor of multiple sites without organ or systems involvement: Secondary | ICD-10-CM

## 2018-08-09 DIAGNOSIS — M353 Polymyalgia rheumatica: Secondary | ICD-10-CM

## 2018-08-09 NOTE — Telephone Encounter (Signed)
Patient called stating her pain has increased since she decreased her prescription of Prednisone to 3 mg.  Patient is requesting a return call to discuss increasing her dose.

## 2018-08-10 MED ORDER — PREDNISONE 5 MG PO TABS: 10.0000 mg | ORAL_TABLET | Freq: Every day | ORAL | 0 refills | Status: DC

## 2018-08-10 NOTE — Telephone Encounter (Signed)
Patient states she will increase to 10 mg as she was not having pain at 10 mg. Prescription has been sent to the pharmacy for patient. Patient advised she should get a sed rate. Lab order released for patient. Patient is currently in [place] and will not be home until the end of April or early May. Patient advised to get an appointment with her rheumatologist in [place]. Patient advised to let us know how about that appointment. Patient verbalized understanding.

## 2018-08-10 NOTE — Telephone Encounter (Signed)
She should increase the prednisone dose at which she was not hurting.  I would also recommend getting a sedimentation rate.  Please to schedule a follow-up appointment so we can see if she is having polymyalgia flare or rheumatoid arthritis flare.

## 2018-08-10 NOTE — Telephone Encounter (Signed)
Patient states she has been tapering her Prednisone. Patient is on 3 mg. Patient states she is having trouble walking around, getting up and down from a sitting position. Patient states she had started feeling pain before she tapered to the 3 mg. Patient would like to know what she should do. Please advise.

## 2018-08-13 ENCOUNTER — Telehealth: Payer: Self-pay | Admitting: *Deleted

## 2018-08-13 NOTE — Telephone Encounter (Signed)
Patient called to advise she will be having sed rate done tomorrow. Patient asked if she could wait until she gets home to be seen by Dr. Estanislado Pandy instead of getting an appointment with her rheumatologist in [place]. Patient advised per Dr. Estanislado Pandy she needs to go ahead and schedule an appointment. Patient verbalized understanding.

## 2018-08-14 ENCOUNTER — Telehealth: Payer: Self-pay | Admitting: Rheumatology

## 2018-08-14 NOTE — Telephone Encounter (Signed)
Patient advised lab orders have been released as well as faxed.

## 2018-08-14 NOTE — Telephone Encounter (Signed)
Patient called requesting labwork orders for her SED rate be faxed to  [facility] in [place].  Patient states they have not received the fax.  Patient is also requesting to be sent the orders so she has them.  [facility] Fax [phone]  [facility] Fax [phone]  Please call back when they have been faxed to both.  Phone [phone]

## 2018-08-15 DIAGNOSIS — M0579 Rheumatoid arthritis with rheumatoid factor of multiple sites without organ or systems involvement: Secondary | ICD-10-CM | POA: Diagnosis not present

## 2018-08-15 DIAGNOSIS — M353 Polymyalgia rheumatica: Secondary | ICD-10-CM | POA: Diagnosis not present

## 2018-08-17 ENCOUNTER — Telehealth: Payer: Self-pay | Admitting: *Deleted

## 2018-08-17 DIAGNOSIS — M25551 Pain in right hip: Secondary | ICD-10-CM | POA: Diagnosis not present

## 2018-08-17 DIAGNOSIS — M0579 Rheumatoid arthritis with rheumatoid factor of multiple sites without organ or systems involvement: Secondary | ICD-10-CM | POA: Diagnosis not present

## 2018-08-17 DIAGNOSIS — M1611 Unilateral primary osteoarthritis, right hip: Secondary | ICD-10-CM | POA: Diagnosis not present

## 2018-08-17 DIAGNOSIS — Z7952 Long term (current) use of systemic steroids: Secondary | ICD-10-CM | POA: Diagnosis not present

## 2018-08-17 DIAGNOSIS — Z79899 Other long term (current) drug therapy: Secondary | ICD-10-CM | POA: Diagnosis not present

## 2018-08-17 NOTE — Telephone Encounter (Signed)
I spoke with patient and discussed that she should taper prednisone by 2.5 mg every 4 days.  I also advised her to get her labs done in couple of weeks after increasing her methotrexate dose as her creatinine was elevated.  She will discuss this further with her rheumatologist in [place].

## 2018-08-17 NOTE — Telephone Encounter (Signed)
Patient states she went to the rheumatologist in [place]. Patient states that she is being sent for a Right hip x-ray. Patient states the doctor that he believes the pain is coming from her right hip. Patient states that the doctor is wanting her to stop the Prednisone completely and increase MTX to 6 tabs weekly. Patient would like to know what your opinion is before making a decision.

## 2018-08-18 ENCOUNTER — Encounter: Payer: Self-pay | Admitting: Rheumatology

## 2018-08-23 ENCOUNTER — Telehealth: Payer: Self-pay

## 2018-08-23 NOTE — Telephone Encounter (Signed)
Received faxed from [facility].   08/15/2018 Sed Rate: 29  Within normal limits, Reviewed by Hazel Sams, PA-C.   Will send document to scan center.

## 2018-09-06 ENCOUNTER — Telehealth: Payer: Self-pay | Admitting: *Deleted

## 2018-09-06 MED ORDER — PREDNISONE 5 MG PO TABS: 5.0000 mg | ORAL_TABLET | Freq: Every day | ORAL | 2 refills | Status: DC

## 2018-09-06 NOTE — Telephone Encounter (Signed)
I spoke with patient and reviewed the symptoms she is experiencing.  I also spoke with Dr. Estanislado Pandy and she recommended restarting the patient on the lowest dose that is effective for her.  She will restart on prednisone 6 mg po daily and taper by 1 mg every month.  She will continue on MTX 6 tablets po once weekly as prescribed.  She was advised to notify us if her symptoms persist or worsen.  All questions were addressed.  A refill of prednisone was sent to the pharmacy.    Hazel Sams, PA-C

## 2018-09-06 NOTE — Telephone Encounter (Signed)
Patient states that she was taken off the prednisone as a request from her rheumatologist in [facility]. Patient states she has been off the Prednisone since 08/31/18. Patient states she is having trouble getting up and down as well as getting around. Patient states her rheumatologist in [place] increased MTX to 6 tabs weekly. Patient states she had a hip x-ray that showed OA. Patient is requesting prednisone. Please advise.

## 2018-09-17 ENCOUNTER — Telehealth: Payer: Self-pay

## 2018-09-17 DIAGNOSIS — M545 Low back pain: Secondary | ICD-10-CM | POA: Diagnosis not present

## 2018-09-17 DIAGNOSIS — M5380 Other specified dorsopathies, site unspecified: Secondary | ICD-10-CM | POA: Diagnosis not present

## 2018-09-17 DIAGNOSIS — M5126 Other intervertebral disc displacement, lumbar region: Secondary | ICD-10-CM | POA: Diagnosis not present

## 2018-09-17 DIAGNOSIS — M5416 Radiculopathy, lumbar region: Secondary | ICD-10-CM | POA: Diagnosis not present

## 2018-09-17 DIAGNOSIS — Z7901 Long term (current) use of anticoagulants: Secondary | ICD-10-CM | POA: Diagnosis not present

## 2018-09-17 DIAGNOSIS — M48061 Spinal stenosis, lumbar region without neurogenic claudication: Secondary | ICD-10-CM | POA: Diagnosis not present

## 2018-09-17 DIAGNOSIS — M5136 Other intervertebral disc degeneration, lumbar region: Secondary | ICD-10-CM | POA: Diagnosis not present

## 2018-09-17 DIAGNOSIS — M4726 Other spondylosis with radiculopathy, lumbar region: Secondary | ICD-10-CM | POA: Diagnosis not present

## 2018-09-17 NOTE — Telephone Encounter (Signed)
Dr. Irish Lack, [place] to hold plavix for 7 days prior to back injection?

## 2018-09-17 NOTE — Telephone Encounter (Signed)
   [facility] Pre-operative Risk Assessment    Request for surgical clearance:  1. What type of surgery is being performed? Interventional injection for pain management    2. When is this surgery scheduled?  TBD   3. What type of clearance is required (medical clearance vs. Pharmacy clearance to hold med vs. Both)? Pharmacy  4. Are there any medications that need to be held prior to surgery and how long? Plavix for 7 days    5. Practice name and name of physician performing surgery? [facility]         6.What is your office phone number  [phone]   7.   What is your office fax number [phone]  8.   Anesthesia type (None, local, MAC, general) ? None listed   ________________________________________________   (provider comments below)

## 2018-09-18 ENCOUNTER — Telehealth: Payer: Self-pay

## 2018-09-18 NOTE — Telephone Encounter (Signed)
   Primary Cardiologist: Larae Grooms, MD  Chart reviewed as part of pre-operative protocol coverage.   [place] requests guidance on holding plavix for back injection, not medical clearance.  Per Dr. Irish Lack,  [place] to hold plavix for 7 days prior to injection.  I will route this recommendation to the requesting party via Epic fax function and remove from pre-op pool.  Please call with questions.  Tami Lin Miliani Deike, PA 09/18/2018, 12:41 PM

## 2018-09-18 NOTE — Telephone Encounter (Signed)
OK to hold Plavix for 7 days prior to injection.

## 2018-10-10 NOTE — Progress Notes (Signed)
Office Visit Note  Patient: Sophia Ward             Date of Birth: 1932/06/20           MRN: [id number]             PCP: [name], MD Referring: [name], MD Visit Date: 10/17/2018 Occupation: @GUAROCC @  Subjective:  Other (right hip pain )    History of Present Illness: Sophia Ward is a 85 y.o. female with history of rheumatoid arthritis and polymyalgia rheumatica.  She states that while she was in [place] she was taken off the prednisone but her symptoms flare.  Now she is back on prednisone 5 mg p.o. daily the plan is to taper by 1 mg every month.  She has been also taking methotrexate 6 tablets/week.  She states after her last right trochanteric bursa injection she did not have much relief.  She was given a spinal injection by pain management in [place] which helped her symptoms.  She has been getting injections in her lower back by pain management.  He denies any joint pain or joint swelling at this time.  Denies any muscle weakness.  She also had x-ray by Dr. Philbert Riser in [place] of her right hip joint which showed only mild osteoarthritic changes.  Activities of Daily Living:  Patient reports morning stiffness for 0 minutes.   Patient Reports nocturnal pain.  Difficulty dressing/grooming: Denies Difficulty climbing stairs: Reports Difficulty getting out of chair: Reports Difficulty using hands for taps, buttons, cutlery, and/or writing: Denies  Review of Systems  Constitutional: Positive for fatigue. Negative for night sweats, weight gain and weight loss.  HENT: Positive for mouth dryness. Negative for mouth sores, trouble swallowing, trouble swallowing and nose dryness.   Eyes: Positive for dryness. Negative for pain, redness, itching and visual disturbance.  Respiratory: Negative for cough, shortness of breath, wheezing and difficulty breathing.   Cardiovascular: Negative for chest pain, palpitations, hypertension, irregular heartbeat and swelling in  legs/feet.  Gastrointestinal: Negative for abdominal pain, blood in stool, constipation and diarrhea.  Endocrine: Negative for increased urination.  Genitourinary: Negative for painful urination, pelvic pain and vaginal dryness.  Musculoskeletal: Negative for arthralgias, joint pain, joint swelling, myalgias, muscle weakness, morning stiffness, muscle tenderness and myalgias.  Skin: Negative for color change, rash, hair loss, redness, skin tightness, ulcers and sensitivity to sunlight.  Allergic/Immunologic: Negative for susceptible to infections.  Neurological: Negative for dizziness, light-headedness, headaches, memory loss, night sweats and weakness.  Hematological: Negative for bruising/bleeding tendency and swollen glands.  Psychiatric/Behavioral: Negative for depressed mood, confusion and sleep disturbance. The patient is not nervous/anxious.     PMFS History:  Patient Active Problem List   Diagnosis Date Noted  . Polymyalgia rheumatica ([place]) 11/25/2016  . Primary osteoarthritis of both hands 11/25/2016  . Bilateral primary osteoarthritis of knee 11/25/2016  . Osteopenia of multiple sites 11/25/2016  . 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 Osteoarthritis of lumbar spine 10/28/2016  . History of diabetes mellitus 10/27/2016  . History of gastroesophageal reflux (GERD) 10/27/2016  . Rheumatoid arthritis involving multiple sites with positive rheumatoid factor ([place]) 10/14/2016  . Elevated C-reactive protein (CRP) 10/14/2016  . Elevated sed rate 10/14/2016  . High risk medication use 10/14/2016  . Coronary atherosclerosis of native coronary artery 01/30/2014  . Mixed hyperlipidemia 01/30/2014  . Essential hypertension, benign 01/30/2014  . Obesity, unspecified 01/30/2014    Past Medical History:  Diagnosis Date  . Allergic rhinitis   .  Anemia   . Arthritis    hands  . BPPV (benign paroxysmal positional vertigo)   . Coronary artery disease   . Coronary  atherosclerosis of native coronary artery 01/30/2014  . Diabetes mellitus without complication ([place])   . Essential hypertension, benign 01/30/2014  . GERD (gastroesophageal reflux disease)   . Hypertension   . Mixed hyperlipidemia 01/30/2014  . Myocardial infarction ([place]) 2004   mild MI-no damage- had stents  . Osteopenia   . Post-menopausal   . Stenosing tenosynovitis     Family History  Problem Relation Age of Onset  . CAD Mother   . Heart attack Mother   . CAD Father   . Heart Problems Brother        open heart surgery   . Arthritis Daughter        psoriatic arthritis    Past Surgical History:  Procedure Laterality Date  . ABDOMINAL HYSTERECTOMY    . APPENDECTOMY    . CARDIAC CATHETERIZATION  2004  . carpel tunnel    . COLONOSCOPY WITH PROPOFOL N/A 10/30/2012   Procedure: COLONOSCOPY WITH PROPOFOL;  Surgeon: Garlan Fair, MD;  Location: [facility];  Service: Endoscopy;  Laterality: N/A;  . CORONARY ANGIOPLASTY  2004  . DILATION AND CURETTAGE OF UTERUS    . ESOPHAGOGASTRODUODENOSCOPY (EGD) WITH PROPOFOL N/A 10/30/2012   Procedure: ESOPHAGOGASTRODUODENOSCOPY (EGD) WITH PROPOFOL;  Surgeon: Garlan Fair, MD;  Location: [facility];  Service: Endoscopy;  Laterality: N/A;  . EYE SURGERY     bilateral cataract with lens implant  . EYE SURGERY    . left shouler surgery    . TONSILLECTOMY     Social History   Social History Narrative  . Not on file   Immunization History  Administered Date(s) Administered  . Influenza, High Dose Seasonal PF 03/09/2014, 04/09/2015, 03/16/2016, 02/25/2017, 03/20/2018  . Zoster Recombinat (Shingrix) 02/07/2018, 06/04/2018     Objective: Vital Signs: BP 136/67 (BP Location: Left Arm, Patient Position: Sitting, Cuff Size: Large)   Pulse 73   Resp 14   Ht 5' 2.5" (1.588 m)   Wt 178 lb 6.4 oz (80.9 kg)   BMI 32.11 kg/m    Physical Exam Vitals signs and nursing note reviewed.  Constitutional:      Appearance: She is well-developed.   HENT:     Head: Normocephalic and atraumatic.  Eyes:     Conjunctiva/sclera: Conjunctivae normal.  Neck:     Musculoskeletal: Normal range of motion.  Cardiovascular:     Rate and Rhythm: Normal rate and regular rhythm.     Heart sounds: Normal heart sounds.  Pulmonary:     Effort: Pulmonary effort is normal.     Breath sounds: Normal breath sounds.  Abdominal:     General: Bowel sounds are normal.     Palpations: Abdomen is soft.  Lymphadenopathy:     Cervical: No cervical adenopathy.  Skin:    General: Skin is warm and dry.     Capillary Refill: Capillary refill takes less than 2 seconds.  Neurological:     Mental Status: She is alert and oriented to person, place, and time.  Psychiatric:        Behavior: Behavior normal.      Musculoskeletal Exam: C-spine with some good range of motion.  She has some thoracic kyphosis.  Shoulder joints elbow joints wrist joint MCPs PIPs DIPs been good range of motion with no synovitis.  Hip joints knee joints ankles MTPs PIPs been good  range of motion with no synovitis.  She had good upper and lower extremity muscle strength.  She had no difficulty getting up from the chair.  CDAI Exam: CDAI Score: 0.2  Patient Global Assessment: 1 (mm); Provider Global Assessment: 1 (mm) Swollen: 0 ; Tender: 0  Joint Exam   Not documented   There is currently no information documented on the homunculus. Go to the Rheumatology activity and complete the homunculus joint exam.  Investigation: No additional findings.  Imaging: No results found.  Recent Labs: Lab Results  Component Value Date   WBC 16.3 (H) 06/05/2018   HGB 13.5 06/05/2018   PLT 372 06/05/2018   NA 142 06/05/2018   K 3.9 06/05/2018   CL 105 06/05/2018   CO2 27 06/05/2018   GLUCOSE 119 (H) 06/05/2018   BUN 17 06/05/2018   CREATININE 0.98 (H) 06/05/2018   BILITOT 0.5 06/05/2018   ALKPHOS 45 06/05/2018   AST 11 06/05/2018   ALT 10 06/05/2018   PROT 7.3 06/05/2018   ALBUMIN  3.4 (L) 06/05/2018   CALCIUM 9.5 06/05/2018   GFRAA 62 06/05/2018    Speciality Comments: No specialty comments available.  Procedures:  No procedures performed Allergies: Codeine and Penicillins   Assessment / Plan:     Visit Diagnoses: Rheumatoid arthritis involving multiple sites with positive rheumatoid factor (HCC) -  RF 309, ANA negative, CRP 5.8, synovitis, treated by Dr. Philbert Riser in [place] during the winter months.  She is clinically doing well on methotrexate.  She has been taking 6 tablets/week.  Polymyalgia rheumatica (HCC) - Prednisone 5 mg p.o. daily currently.  She is supposed to taper by 1 mg every month.  She had a flare with rapid taper in the past.  High risk medication use - Methotrexate 6 tablets weekly and folic acid 1mg  2 tablets daily? Most recent CBC/CMP stable on 08/03/2018 and will monitor every 3 months. Standing orders are in place.  She received her flu vaccine in October and had Shingrix in 2019. Recommend Pneumovax 23 and Prevnar 13 as indicated.  Primary osteoarthritis of both hands-joint protection muscle strengthening was discussed.  Primary osteoarthritis of both knees-she has some discomfort in her knee joints.  Trochanteric bursitis, right hip-she has intermittent discomfort currently.  Primary osteoarthritis of both feet-not having much discomfort currently.  Osteopenia of multiple sites-her most recent bone density was December 2019 by her PCP.  She is on calcium and vitamin D.  Other medical problems are listed as follows:  History of hyperlipidemia  History of diabetes mellitus  History of gastroesophageal reflux (GERD)  History of hypertension   Orders: Orders Placed This Encounter  Procedures  . CBC with Differential/Platelet  . COMPLETE METABOLIC PANEL WITH GFR   Meds ordered this encounter  Medications  . methotrexate 2.5 MG tablet    Sig: TAKE 6 TABLETS BY MOUTH ONCE A WEEK    Dispense:  72 tablet    Refill:  0     Please consider 90 day supplies to promote better adherence  . folic acid (FOLVITE) 1 MG tablet    Sig: Take 2 tablets (2 mg total) by mouth daily.    Dispense:  180 tablet    Refill:  2  . predniSONE (DELTASONE) 1 MG tablet    Sig: Take 4 tablets (4 mg total) by mouth daily with breakfast.    Dispense:  120 tablet    Refill:  0    Face-to-face time spent with patient was 30 minutes.  Greater than 50% of time was spent in counseling and coordination of care.  Follow-Up Instructions: Return in about 4 months (around 02/17/2019) for Rheumatoid arthritis,PMR.   Bo Merino, MD  Note - This record has been created using Editor, commissioning.  Chart creation errors have been sought, but may not always  have been located. Such creation errors do not reflect on  the standard of medical care.

## 2018-10-12 ENCOUNTER — Ambulatory Visit: Payer: Medicare Other | Admitting: Rheumatology

## 2018-10-15 DIAGNOSIS — I2510 Atherosclerotic heart disease of native coronary artery without angina pectoris: Secondary | ICD-10-CM | POA: Diagnosis not present

## 2018-10-15 DIAGNOSIS — E119 Type 2 diabetes mellitus without complications: Secondary | ICD-10-CM | POA: Diagnosis not present

## 2018-10-15 DIAGNOSIS — Z794 Long term (current) use of insulin: Secondary | ICD-10-CM | POA: Diagnosis not present

## 2018-10-17 ENCOUNTER — Other Ambulatory Visit: Payer: Self-pay

## 2018-10-17 ENCOUNTER — Encounter: Payer: Self-pay | Admitting: Rheumatology

## 2018-10-17 ENCOUNTER — Ambulatory Visit (INDEPENDENT_AMBULATORY_CARE_PROVIDER_SITE_OTHER): Payer: Medicare Other | Admitting: Rheumatology

## 2018-10-17 VITALS — BP 136/67 | HR 73 | Resp 14 | Ht 62.5 in | Wt 178.4 lb

## 2018-10-17 DIAGNOSIS — M19041 Primary osteoarthritis, right hand: Secondary | ICD-10-CM

## 2018-10-17 DIAGNOSIS — M19071 Primary osteoarthritis, right ankle and foot: Secondary | ICD-10-CM | POA: Diagnosis not present

## 2018-10-17 DIAGNOSIS — M353 Polymyalgia rheumatica: Secondary | ICD-10-CM | POA: Diagnosis not present

## 2018-10-17 DIAGNOSIS — Z8679 Personal history of other diseases of the circulatory system: Secondary | ICD-10-CM

## 2018-10-17 DIAGNOSIS — M8589 Other specified disorders of bone density and structure, multiple sites: Secondary | ICD-10-CM

## 2018-10-17 DIAGNOSIS — M19072 Primary osteoarthritis, left ankle and foot: Secondary | ICD-10-CM

## 2018-10-17 DIAGNOSIS — M7061 Trochanteric bursitis, right hip: Secondary | ICD-10-CM | POA: Diagnosis not present

## 2018-10-17 DIAGNOSIS — Z8639 Personal history of other endocrine, nutritional and metabolic disease: Secondary | ICD-10-CM

## 2018-10-17 DIAGNOSIS — M0579 Rheumatoid arthritis with rheumatoid factor of multiple sites without organ or systems involvement: Secondary | ICD-10-CM

## 2018-10-17 DIAGNOSIS — M170 Bilateral primary osteoarthritis of knee: Secondary | ICD-10-CM

## 2018-10-17 DIAGNOSIS — Z8719 Personal history of other diseases of the digestive system: Secondary | ICD-10-CM

## 2018-10-17 DIAGNOSIS — Z79899 Other long term (current) drug therapy: Secondary | ICD-10-CM | POA: Diagnosis not present

## 2018-10-17 DIAGNOSIS — M19042 Primary osteoarthritis, left hand: Secondary | ICD-10-CM | POA: Diagnosis not present

## 2018-10-17 MED ORDER — FOLIC ACID 1 MG PO TABS: 2.0000 mg | ORAL_TABLET | Freq: Every day | ORAL | 2 refills | Status: DC

## 2018-10-17 MED ORDER — METHOTREXATE SODIUM 2.5 MG PO TABS: ORAL_TABLET | ORAL | 0 refills | Status: DC

## 2018-10-17 MED ORDER — PREDNISONE 1 MG PO TABS: 4.0000 mg | ORAL_TABLET | Freq: Every day | ORAL | 0 refills | Status: DC

## 2018-10-17 NOTE — Patient Instructions (Signed)
Standing Labs We placed an order today for your standing lab work.    Please come back and get your standing labs in August and every 3 months  We have open lab Monday through Friday from 8:30-11:30 AM and 1:30-4:00 PM  at the office of Dr. Bo Merino.   You may experience shorter wait times on Monday and Friday afternoons. The office is located at [address], [place], [place], [place] [address] No appointment is necessary.   Labs are drawn by [facility].  You may receive a bill from [facility] for your lab work.  If you wish to have your labs drawn at another location, please call the office 24 hours in advance to send orders.  If you have any questions regarding directions or hours of operation,  please call [phone].   Just as a reminder please drink plenty of water prior to coming for your lab work. Thanks!

## 2018-10-18 LAB — CBC WITH DIFFERENTIAL/PLATELET
Absolute Monocytes: 1220 cells/uL — ABNORMAL HIGH (ref 200–950)
Basophils Absolute: 79 cells/uL (ref 0–200)
Basophils Relative: 0.7 %
Eosinophils Absolute: 441 cells/uL (ref 15–500)
Eosinophils Relative: 3.9 %
HCT: 38.1 % (ref 35.0–45.0)
Hemoglobin: 12.6 g/dL (ref 11.7–15.5)
Lymphs Abs: 2904 cells/uL (ref 850–3900)
MCH: 31.3 pg (ref 27.0–33.0)
MCHC: 33.1 g/dL (ref 32.0–36.0)
MCV: 94.8 fL (ref 80.0–100.0)
MPV: 9.6 fL (ref 7.5–12.5)
Monocytes Relative: 10.8 %
Neutro Abs: 6656 cells/uL (ref 1500–7800)
Neutrophils Relative %: 58.9 %
Platelets: 321 10*3/uL (ref 140–400)
RBC: 4.02 10*6/uL (ref 3.80–5.10)
RDW: 14.5 % (ref 11.0–15.0)
Total Lymphocyte: 25.7 %
WBC: 11.3 10*3/uL — ABNORMAL HIGH (ref 3.8–10.8)

## 2018-10-18 LAB — COMPLETE METABOLIC PANEL WITH GFR
AG Ratio: 1.4 (calc) (ref 1.0–2.5)
ALT: 14 U/L (ref 6–29)
AST: 14 U/L (ref 10–35)
Albumin: 3.9 g/dL (ref 3.6–5.1)
Alkaline phosphatase (APISO): 34 U/L — ABNORMAL LOW (ref 37–153)
BUN/Creatinine Ratio: 17 (calc) (ref 6–22)
BUN: 17 mg/dL (ref 7–25)
CO2: 27 mmol/L (ref 20–32)
Calcium: 9.4 mg/dL (ref 8.6–10.4)
Chloride: 105 mmol/L (ref 98–110)
Creat: 1.03 mg/dL — ABNORMAL HIGH (ref 0.60–0.88)
GFR, Est African American: 58 mL/min/{1.73_m2} — ABNORMAL LOW (ref >=60)
GFR, Est Non African American: 50 mL/min/{1.73_m2} — ABNORMAL LOW (ref >=60)
Globulin: 2.8 g/dL (calc) (ref 1.9–3.7)
Glucose, Bld: 108 mg/dL — ABNORMAL HIGH (ref 65–99)
Potassium: 4.2 mmol/L (ref 3.5–5.3)
Sodium: 143 mmol/L (ref 135–146)
Total Bilirubin: 0.6 mg/dL (ref 0.2–1.2)
Total Protein: 6.7 g/dL (ref 6.1–8.1)

## 2018-10-18 NOTE — Progress Notes (Signed)
stable °

## 2018-10-30 DIAGNOSIS — L308 Other specified dermatitis: Secondary | ICD-10-CM | POA: Diagnosis not present

## 2018-10-30 DIAGNOSIS — L821 Other seborrheic keratosis: Secondary | ICD-10-CM | POA: Diagnosis not present

## 2018-10-30 DIAGNOSIS — C44329 Squamous cell carcinoma of skin of other parts of face: Secondary | ICD-10-CM | POA: Diagnosis not present

## 2018-10-30 DIAGNOSIS — D485 Neoplasm of uncertain behavior of skin: Secondary | ICD-10-CM | POA: Diagnosis not present

## 2018-10-30 DIAGNOSIS — C44629 Squamous cell carcinoma of skin of left upper limb, including shoulder: Secondary | ICD-10-CM | POA: Diagnosis not present

## 2018-10-30 DIAGNOSIS — L570 Actinic keratosis: Secondary | ICD-10-CM | POA: Diagnosis not present

## 2018-10-30 DIAGNOSIS — Z85828 Personal history of other malignant neoplasm of skin: Secondary | ICD-10-CM | POA: Diagnosis not present

## 2018-10-30 DIAGNOSIS — L812 Freckles: Secondary | ICD-10-CM | POA: Diagnosis not present

## 2018-10-30 DIAGNOSIS — B029 Zoster without complications: Secondary | ICD-10-CM | POA: Diagnosis not present

## 2018-10-31 DIAGNOSIS — H11221 Conjunctival granuloma, right eye: Secondary | ICD-10-CM | POA: Diagnosis not present

## 2018-11-06 DIAGNOSIS — C44329 Squamous cell carcinoma of skin of other parts of face: Secondary | ICD-10-CM | POA: Diagnosis not present

## 2018-11-06 DIAGNOSIS — Z85828 Personal history of other malignant neoplasm of skin: Secondary | ICD-10-CM | POA: Diagnosis not present

## 2018-11-09 DIAGNOSIS — H11221 Conjunctival granuloma, right eye: Secondary | ICD-10-CM | POA: Diagnosis not present

## 2018-11-19 ENCOUNTER — Telehealth: Payer: Self-pay | Admitting: Rheumatology

## 2018-11-19 NOTE — Telephone Encounter (Signed)
I spoke with Dr. Estanislado Pandy and she recommended splitting the dose of Methotrexate to 3 tablets by mouth on Wednesdays and 3 tablets by mouth on Saturdays.  If her fatigue persists, she can reduce to 5 tablets po once weekly. Patient verbalized understanding. Patient will contact the office if she continues to have fatigue.

## 2018-11-19 NOTE — Telephone Encounter (Signed)
Patient states she feels like she does not have any energy. Patient states she saw her rheumatologist in [facility], Dr. Philbert Riser. Patient states he increased her MTX from 3 tablets to 6 tablets. Patient states she has been sleeping a lot more than normal. Patient states she feels like her legs will not carry her, feeling like her legs are weak. Patient states she has been on the increased dose of MTX since March 2020. Patient states the fatigue and loss  Of energy has increased since March 2020. Patient states she is also on Prednisone 3 mg daily. Patient would like to know if decreasing her MTX might help with this. Please advise.

## 2018-11-19 NOTE — Telephone Encounter (Signed)
Patient called stating she has been feeling very tired since Dr. Estanislado Pandy increased her Methotrexate.  Patient states when she first increased the dosage she was tired in the morning, but would feel better by the afternoon.  Now she is tired all day and when she sits down she falls asleep.  Patient is requesting a return call.

## 2018-11-19 NOTE — Telephone Encounter (Signed)
I spoke with Dr. Estanislado Pandy and she recommended splitting the dose of Methotrexate to 3 tablets by mouth on Wednesdays and 3 tablets by mouth on Saturdays.  If her fatigue persists, she can reduce to 5 tablets po once weekly.

## 2018-11-21 DIAGNOSIS — N390 Urinary tract infection, site not specified: Secondary | ICD-10-CM | POA: Diagnosis not present

## 2018-11-21 DIAGNOSIS — R399 Unspecified symptoms and signs involving the genitourinary system: Secondary | ICD-10-CM | POA: Diagnosis not present

## 2018-12-06 DIAGNOSIS — N390 Urinary tract infection, site not specified: Secondary | ICD-10-CM | POA: Diagnosis not present

## 2018-12-06 DIAGNOSIS — E1122 Type 2 diabetes mellitus with diabetic chronic kidney disease: Secondary | ICD-10-CM | POA: Diagnosis not present

## 2018-12-06 DIAGNOSIS — I2510 Atherosclerotic heart disease of native coronary artery without angina pectoris: Secondary | ICD-10-CM | POA: Diagnosis not present

## 2018-12-06 DIAGNOSIS — N183 Chronic kidney disease, stage 3 (moderate): Secondary | ICD-10-CM | POA: Diagnosis not present

## 2018-12-06 DIAGNOSIS — E782 Mixed hyperlipidemia: Secondary | ICD-10-CM | POA: Diagnosis not present

## 2018-12-06 DIAGNOSIS — Z794 Long term (current) use of insulin: Secondary | ICD-10-CM | POA: Diagnosis not present

## 2018-12-06 DIAGNOSIS — I10 Essential (primary) hypertension: Secondary | ICD-10-CM | POA: Diagnosis not present

## 2018-12-06 DIAGNOSIS — M353 Polymyalgia rheumatica: Secondary | ICD-10-CM | POA: Diagnosis not present

## 2018-12-06 DIAGNOSIS — N898 Other specified noninflammatory disorders of vagina: Secondary | ICD-10-CM | POA: Diagnosis not present

## 2018-12-06 DIAGNOSIS — M069 Rheumatoid arthritis, unspecified: Secondary | ICD-10-CM | POA: Diagnosis not present

## 2018-12-10 DIAGNOSIS — M48061 Spinal stenosis, lumbar region without neurogenic claudication: Secondary | ICD-10-CM | POA: Diagnosis not present

## 2018-12-12 ENCOUNTER — Telehealth: Payer: Self-pay | Admitting: Rheumatology

## 2018-12-12 NOTE — Telephone Encounter (Signed)
Patient left a message requesting a refill on Prednisone 1mg . 3 per month for 3 months.

## 2018-12-12 NOTE — Telephone Encounter (Signed)
Attempted to contact the patient and left message for patient to call the office.  

## 2018-12-13 MED ORDER — PREDNISONE 1 MG PO TABS: ORAL_TABLET | ORAL | 0 refills | Status: DC

## 2018-12-13 NOTE — Telephone Encounter (Signed)
Patient states she is currently on Prednisone 3 mg and sue to taper to 2 mg on 12/29/18. Patient advised will send prescription to the pharmacy.

## 2018-12-13 NOTE — Addendum Note (Signed)
Addended by: Carole Binning on: 12/13/2018 10:56 AM   Modules accepted: Orders

## 2018-12-16 NOTE — Progress Notes (Signed)
Marland Kitchen    HEMATOLOGY/ONCOLOGY CLINIC NOTE  Date of Service: 12/17/2018  Patient Care Team: [name], MD as PCP - General (Internal Medicine) Jettie Booze, MD as PCP - Cardiology (Cardiology) Brunetta Genera, MD as Consulting Physician (Hematology) Bo Merino, MD as Consulting Physician (Rheumatology)   CHIEF COMPLAINTS/PURPOSE OF CONSULTATION:  Elevated WBC counts   HISTORY OF PRESENTING ILLNESS:  Sophia Ward is a wonderful 85 y.o. female who has been referred to [place] by Dr .[name], MD for evaluation and management of leucocytosis.  Patient has a h/o RA on MTX and prednisone taper, CAD,htn who was referral for evaluation of leucocytosis. Patient notes that she has had elevated WBC counts for "years".  Based on available labs patient has had chronic neutrophilia and monocytosis since at least 10/2016 with WBC counts ranging form 14k to 22k. Recent labs on 12/6 showed WBC counts of 22k which has since improved to 14.1l on 06/05/2018. hgb is normal at 13.4 and platelets are normal at 331k.  She notes no fevers/chills/nightsweats/or unexpected weight loss. Has joint pain issues from RA.  No other acute new focal symptoms.   INTERVAL HISTORY:  Sophia Ward is a 85 y.o. female here today for follow up and treatment of her leucocytosis. The patient's last visit with [place] was on 06/02/2018. The pt reports that she is doing well overall.  The pt reports that she has been getting steroid shots every three to four months for her arthritis.  Of note since the patient's last visit, pt has had a hip xray completed on 11/20/2018 with results revealing minimal degenerative osteoarthritis of the right hip.  Lab results today (12/17/18) of CBC w/diff and CMP is as follows: all values are WNL except for WBC at 12.8K, neutro abs at 8.6K, monocytes at 1.6K, glucose at 158, creatinine at 1.12, GFR est non af at 45, and GFR est AFR at 53. LDH from 12/17/18 at 228.    On review of systems, pt denies any new lumps or bumps or any other symptoms.    MEDICAL HISTORY:  Past Medical History:  Diagnosis Date  . Allergic rhinitis   . Anemia   . Arthritis    hands  . BPPV (benign paroxysmal positional vertigo)   . Coronary artery disease   . Coronary atherosclerosis of native coronary artery 01/30/2014  . Diabetes mellitus without complication ([place])   . Essential hypertension, benign 01/30/2014  . GERD (gastroesophageal reflux disease)   . Hypertension   . Mixed hyperlipidemia 01/30/2014  . Myocardial infarction ([place]) 2004   mild MI-no damage- had stents  . Osteopenia   . Post-menopausal   . Stenosing tenosynovitis     SURGICAL HISTORY: Past Surgical History:  Procedure Laterality Date  . ABDOMINAL HYSTERECTOMY    . APPENDECTOMY    . CARDIAC CATHETERIZATION  2004  . carpel tunnel    . COLONOSCOPY WITH PROPOFOL N/A 10/30/2012   Procedure: COLONOSCOPY WITH PROPOFOL;  Surgeon: Garlan Fair, MD;  Location: [facility];  Service: Endoscopy;  Laterality: N/A;  . CORONARY ANGIOPLASTY  2004  . DILATION AND CURETTAGE OF UTERUS    . ESOPHAGOGASTRODUODENOSCOPY (EGD) WITH PROPOFOL N/A 10/30/2012   Procedure: ESOPHAGOGASTRODUODENOSCOPY (EGD) WITH PROPOFOL;  Surgeon: Garlan Fair, MD;  Location: [facility];  Service: Endoscopy;  Laterality: N/A;  . EYE SURGERY     bilateral cataract with lens implant  . EYE SURGERY    . left shouler surgery    . TONSILLECTOMY  SOCIAL HISTORY: Social History   Socioeconomic History  . Marital status: Married    Spouse name: Not on file  . Number of children: Not on file  . Years of education: Not on file  . Highest education level: Not on file  Occupational History  . Not on file  Social Needs  . Financial resource strain: Not on file  . Food insecurity    Worry: Not on file    Inability: Not on file  . Transportation needs    Medical: Not on file    Non-medical: Not on file  Tobacco Use  .  Smoking status: Never Smoker  . Smokeless tobacco: Never Used  Substance and Sexual Activity  . Alcohol use: No  . Drug use: No  . Sexual activity: Not on file  Lifestyle  . Physical activity    Days per week: Not on file    Minutes per session: Not on file  . Stress: Not on file  Relationships  . Social Herbalist on phone: Not on file    Gets together: Not on file    Attends religious service: Not on file    Active member of club or organization: Not on file    Attends meetings of clubs or organizations: Not on file    Relationship status: Not on file  . Intimate partner violence    Fear of current or ex partner: Not on file    Emotionally abused: Not on file    Physically abused: Not on file    Forced sexual activity: Not on file  Other Topics Concern  . Not on file  Social History Narrative  . Not on file    FAMILY HISTORY: Family History  Problem Relation Age of Onset  . CAD Mother   . Heart attack Mother   . CAD Father   . Heart Problems Brother        open heart surgery   . Arthritis Daughter        psoriatic arthritis     ALLERGIES:  is allergic to codeine and penicillins.   MEDICATIONS:  Current Outpatient Medications  Medication Sig Dispense Refill  . Acetaminophen (TYLENOL ARTHRITIS PAIN PO) Take by mouth as needed.    Marland Kitchen amLODipine (NORVASC) 5 MG tablet Take 10 mg by mouth every morning.     Marland Kitchen atorvastatin (LIPITOR) 40 MG tablet Take 40 mg by mouth at bedtime.    . Calcium Carbonate-Vitamin D (CALCIUM + D PO) Take 1 tablet by mouth every morning.    . Cholecalciferol (VITAMIN D3) 1000 UNITS CAPS Take 1,000 Units by mouth 2 (two) times daily.     . clopidogrel (PLAVIX) 75 MG tablet Take 75 mg by mouth daily.    . Coenzyme Q10 (COQ10) 200 MG CAPS Take by mouth daily.    Marland Kitchen desonide (DESOWEN) 0.05 % lotion Apply 1 application topically as needed.    . diclofenac sodium (VOLTAREN) 1 % GEL Apply 3 g to 3 large joints up to 3 times daily 3 Tube 3   . escitalopram (LEXAPRO) 5 MG tablet Take 5 mg by mouth daily.    . folic acid (FOLVITE) 1 MG tablet Take 2 tablets (2 mg total) by mouth daily. 180 tablet 2  . insulin lispro (HUMALOG KWIKPEN) 100 UNIT/ML KiwkPen Humalog KwikPen (U-100) Insulin 100 unit/mL subcutaneous  Injecting 7 at breakfast and 12 at lunch an dinner    . LEVEMIR FLEXTOUCH 100 UNIT/ML Pen 25 Units.  0  . methotrexate 2.5 MG tablet TAKE 6 TABLETS BY MOUTH ONCE A WEEK 72 tablet 0  . metoprolol succinate (TOPROL-XL) 100 MG 24 hr tablet Take 100 mg by mouth every morning.    . nitroGLYCERIN (NITROSTAT) 0.4 MG SL tablet DISSOLVE ONE TABLET UNDER THE TONGUE EVERY 5 MINUTES AS NEEDED FOR CHEST PAIN.  DO NOT EXCEED A TOTAL OF 3 DOSES IN 15 MINUTES 25 tablet 5  . Omega-3 Fatty Acids (FISH OIL PO) Take 1 capsule by mouth every morning.    . pantoprazole (PROTONIX) 40 MG tablet Take 40 mg by mouth daily after lunch.    . predniSONE (DELTASONE) 1 MG tablet Take 4 tablets (4 mg total) by mouth daily with breakfast. 120 tablet 0  . predniSONE (DELTASONE) 1 MG tablet Take 3 tablets (3 mg total) by mouth daily with breakfast for 15 days, THEN 2 tablets (2 mg total) daily with breakfast for 31 days, THEN 1 tablet (1 mg total) daily with breakfast. 137 tablet 0  . predniSONE (DELTASONE) 5 MG tablet Take 2 tablets (10 mg total) by mouth daily with breakfast. (Patient not taking: Reported on 10/17/2018) 60 tablet 0  . predniSONE (DELTASONE) 5 MG tablet Take 1 tablet (5 mg total) by mouth daily with breakfast. 30 tablet 2  . ramipril (ALTACE) 10 MG tablet Take 20 mg by mouth every morning.    . traMADol (ULTRAM) 50 MG tablet Take by mouth every 6 (six) hours as needed.     No current facility-administered medications for this visit.     REVIEW OF SYSTEMS:   A 10+ POINT REVIEW OF SYSTEMS WAS OBTAINED including neurology, dermatology, psychiatry, cardiac, respiratory, lymph, extremities, GI, GU, Musculoskeletal, constitutional, breasts,  reproductive, HEENT.  All pertinent positives are noted in the HPI.  All others are negative.     PHYSICAL EXAMINATION: ECOG PERFORMANCE STATUS: 1 - Symptomatic but completely ambulatory  Vitals:   12/17/18 1219  BP: (!) 143/52  Pulse: 70  Resp: 18  Temp: 98.3 F (36.8 C)  SpO2: 96%   Filed Weights   12/17/18 1219  Weight: 172 lb 1.6 oz (78.1 kg)   Body mass index is 30.98 kg/m.  GENERAL:alert, in no acute distress and comfortable SKIN: no acute rashes, no significant lesions EYES: conjunctiva are pink and non-injected, sclera anicteric OROPHARYNX: MMM, no exudates, no oropharyngeal erythema or ulceration NECK: supple, no JVD LYMPH:  no palpable lymphadenopathy in the cervical, axillary or inguinal regions LUNGS: clear to auscultation b/l with normal respiratory effort HEART: regular rate & rhythm ABDOMEN:  normoactive bowel sounds , non tender, not distended. Extremity: no pedal edema PSYCH: alert & oriented x 3 with fluent speech NEURO: no focal motor/sensory deficits    LABORATORY DATA:  I have reviewed the data as listed  CBC Latest Ref Rng & Units 12/17/2018 10/17/2018 06/05/2018  WBC 4.0 - 10.5 K/uL 12.8(H) 11.3(H) 16.3(H)  Hemoglobin 12.0 - 15.0 g/dL 12.5 12.6 13.5  Hematocrit 36.0 - 46.0 % 38.1 38.1 40.0  Platelets 150 - 400 K/uL 336 321 372   CBC    Component Value Date/Time   WBC 12.8 (H) 12/17/2018 1133   WBC 11.3 (H) 10/17/2018 1012   RBC 3.92 12/17/2018 1133   HGB 12.5 12/17/2018 1133   HCT 38.1 12/17/2018 1133   PLT 336 12/17/2018 1133   MCV 97.2 12/17/2018 1133   MCH 31.9 12/17/2018 1133   MCHC 32.8 12/17/2018 1133   RDW 15.1 12/17/2018 1133   LYMPHSABS 2.1  12/17/2018 1133   MONOABS 1.6 (H) 12/17/2018 1133   EOSABS 0.3 12/17/2018 1133   BASOSABS 0.1 12/17/2018 1133    . CMP Latest Ref Rng & Units 12/17/2018 10/17/2018 06/05/2018  Glucose 70 - 99 mg/dL 158(H) 108(H) 119(H)  BUN 8 - 23 mg/dL 16 17 17   Creatinine 0.44 - 1.00 mg/dL 1.12(H)  1.03(H) 0.98(H)  Sodium 135 - 145 mmol/L 141 143 142  Potassium 3.5 - 5.1 mmol/L 4.1 4.2 3.9  Chloride 98 - 111 mmol/L 105 105 105  CO2 22 - 32 mmol/L 24 27 27   Calcium 8.9 - 10.3 mg/dL 9.3 9.4 9.5  Total Protein 6.5 - 8.1 g/dL 7.1 6.7 7.3  Total Bilirubin 0.3 - 1.2 mg/dL 0.5 0.6 0.5  Alkaline Phos 38 - 126 U/L 47 - -  AST 15 - 41 U/L 24 14 11   ALT 0 - 44 U/L 31 14 10    Lab Results  Component Value Date   LDH 228 (H) 12/17/2018   Sed rate 14  06/05/2018 [facility] FISH BCR/ABL    06/05/2018 [facility] OnkoSite NGS JAK2 MPL and CALR    RADIOGRAPHIC STUDIES: I have personally reviewed the radiological images as listed and agreed with the findings in the report. No results found.   ASSESSMENT & PLAN:   85 yo with   1) Chronic leucocytosis from atleast 10/2016 in the 14-22k range. Primarily neutrophilia and monocytosis. Cannot r/o CMML. R/o CML (less likely) Reactive process due to inflammation from RA Does on report any recent fevers or fevers.  No orders of the defined types were placed in this encounter.  2)  Patient Active Problem List   Diagnosis Date Noted  . Polymyalgia rheumatica ([place]) 11/25/2016  . Primary osteoarthritis of both hands 11/25/2016  . Bilateral primary osteoarthritis of knee 11/25/2016  . Osteopenia of multiple sites 11/25/2016  . 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 Osteoarthritis of lumbar spine 10/28/2016  . History of diabetes mellitus 10/27/2016  . History of gastroesophageal reflux (GERD) 10/27/2016  . Rheumatoid arthritis involving multiple sites with positive rheumatoid factor ([place]) 10/14/2016  . Elevated C-reactive protein (CRP) 10/14/2016  . Elevated sed rate 10/14/2016  . High risk medication use 10/14/2016  . Coronary atherosclerosis of native coronary artery 01/30/2014  . Mixed hyperlipidemia 01/30/2014  . Essential hypertension, benign 01/30/2014  . Obesity, unspecified 01/30/2014  -continue mx with PCP.    PLAN: -Discussed pt labwork today, 12/17/18; all values are WNL except for WBC at 12.8K, neutro abs at 8.6K, monocytes at 1.6K, glucose at 158, creatinine at 1.12, GFR est non af at 45, and GFR est AFR at 53. Discussed LDH from 12/17/18 at 228.  -clonal markers neg for BCR-ABL, Jak2,MPL and CALR Discussed increase in WBC likely due to rheumatoid arthritis. And steroid injections  -Discussed follow up with rheumatologist and PCP.  RTC with Dr Irene Limbo as needed  All of the patients questions were answered with apparent satisfaction. The patient knows to call the clinic with any problems, questions or concerns.   The total time spent in the appointment was 15 minutes and more than 50% was on counseling and direct patient cares.  Sullivan Lone MD MS AAHIVMS [facility] Hematology/Oncology Physician [facility]  (Office):       [phone] (Work cell):  [phone] (Fax):           [phone]  12/17/2018 2:49 PM  I, Jacqualyn Posey, am acting as a Education administrator for Dr. Sullivan Lone.   Marland Kitchen  I have reviewed the above documentation for accuracy and completeness, and I agree with the above. Brunetta Genera MD

## 2018-12-17 ENCOUNTER — Other Ambulatory Visit: Payer: Self-pay | Admitting: *Deleted

## 2018-12-17 ENCOUNTER — Inpatient Hospital Stay (HOSPITAL_BASED_OUTPATIENT_CLINIC_OR_DEPARTMENT_OTHER): Payer: Medicare Other | Admitting: Hematology

## 2018-12-17 ENCOUNTER — Inpatient Hospital Stay: Payer: Medicare Other | Attending: Hematology

## 2018-12-17 ENCOUNTER — Other Ambulatory Visit: Payer: Self-pay

## 2018-12-17 VITALS — BP 143/52 | HR 70 | Temp 98.3°F | Resp 18 | Ht 62.5 in | Wt 172.1 lb

## 2018-12-17 DIAGNOSIS — E119 Type 2 diabetes mellitus without complications: Secondary | ICD-10-CM | POA: Insufficient documentation

## 2018-12-17 DIAGNOSIS — M353 Polymyalgia rheumatica: Secondary | ICD-10-CM

## 2018-12-17 DIAGNOSIS — M129 Arthropathy, unspecified: Secondary | ICD-10-CM | POA: Diagnosis not present

## 2018-12-17 DIAGNOSIS — I10 Essential (primary) hypertension: Secondary | ICD-10-CM

## 2018-12-17 DIAGNOSIS — D72829 Elevated white blood cell count, unspecified: Secondary | ICD-10-CM | POA: Diagnosis not present

## 2018-12-17 DIAGNOSIS — M8580 Other specified disorders of bone density and structure, unspecified site: Secondary | ICD-10-CM

## 2018-12-17 DIAGNOSIS — I2510 Atherosclerotic heart disease of native coronary artery without angina pectoris: Secondary | ICD-10-CM

## 2018-12-17 DIAGNOSIS — D72819 Decreased white blood cell count, unspecified: Secondary | ICD-10-CM | POA: Insufficient documentation

## 2018-12-17 DIAGNOSIS — I252 Old myocardial infarction: Secondary | ICD-10-CM

## 2018-12-17 DIAGNOSIS — K219 Gastro-esophageal reflux disease without esophagitis: Secondary | ICD-10-CM | POA: Insufficient documentation

## 2018-12-17 DIAGNOSIS — Z79899 Other long term (current) drug therapy: Secondary | ICD-10-CM

## 2018-12-17 DIAGNOSIS — H8110 Benign paroxysmal vertigo, unspecified ear: Secondary | ICD-10-CM | POA: Insufficient documentation

## 2018-12-17 DIAGNOSIS — E785 Hyperlipidemia, unspecified: Secondary | ICD-10-CM | POA: Diagnosis not present

## 2018-12-17 DIAGNOSIS — D649 Anemia, unspecified: Secondary | ICD-10-CM

## 2018-12-17 DIAGNOSIS — M1990 Unspecified osteoarthritis, unspecified site: Secondary | ICD-10-CM

## 2018-12-17 LAB — CBC WITH DIFFERENTIAL (CANCER CENTER ONLY)
Abs Immature Granulocytes: 0.03 10*3/uL (ref 0.00–0.07)
Basophils Absolute: 0.1 10*3/uL (ref 0.0–0.1)
Basophils Relative: 1 %
Eosinophils Absolute: 0.3 10*3/uL (ref 0.0–0.5)
Eosinophils Relative: 3 %
HCT: 38.1 % (ref 36.0–46.0)
Hemoglobin: 12.5 g/dL (ref 12.0–15.0)
Immature Granulocytes: 0 %
Lymphocytes Relative: 17 %
Lymphs Abs: 2.1 10*3/uL (ref 0.7–4.0)
MCH: 31.9 pg (ref 26.0–34.0)
MCHC: 32.8 g/dL (ref 30.0–36.0)
MCV: 97.2 fL (ref 80.0–100.0)
Monocytes Absolute: 1.6 10*3/uL — ABNORMAL HIGH (ref 0.1–1.0)
Monocytes Relative: 13 %
Neutro Abs: 8.6 10*3/uL — ABNORMAL HIGH (ref 1.7–7.7)
Neutrophils Relative %: 66 %
Platelet Count: 336 10*3/uL (ref 150–400)
RBC: 3.92 MIL/uL (ref 3.87–5.11)
RDW: 15.1 % (ref 11.5–15.5)
WBC Count: 12.8 10*3/uL — ABNORMAL HIGH (ref 4.0–10.5)
nRBC: 0 % (ref 0.0–0.2)

## 2018-12-17 LAB — CMP (CANCER CENTER ONLY)
ALT: 31 U/L (ref 0–44)
AST: 24 U/L (ref 15–41)
Albumin: 3.6 g/dL (ref 3.5–5.0)
Alkaline Phosphatase: 47 U/L (ref 38–126)
Anion gap: 12 (ref 5–15)
BUN: 16 mg/dL (ref 8–23)
CO2: 24 mmol/L (ref 22–32)
Calcium: 9.3 mg/dL (ref 8.9–10.3)
Chloride: 105 mmol/L (ref 98–111)
Creatinine: 1.12 mg/dL — ABNORMAL HIGH (ref 0.44–1.00)
GFR, Est AFR Am: 53 mL/min — ABNORMAL LOW (ref >60)
GFR, Estimated: 45 mL/min — ABNORMAL LOW (ref >60)
Glucose, Bld: 158 mg/dL — ABNORMAL HIGH (ref 70–99)
Potassium: 4.1 mmol/L (ref 3.5–5.1)
Sodium: 141 mmol/L (ref 135–145)
Total Bilirubin: 0.5 mg/dL (ref 0.3–1.2)
Total Protein: 7.1 g/dL (ref 6.5–8.1)

## 2018-12-17 LAB — LACTATE DEHYDROGENASE: LDH: 228 U/L — ABNORMAL HIGH (ref 98–192)

## 2018-12-18 ENCOUNTER — Telehealth: Payer: Self-pay | Admitting: Hematology

## 2018-12-18 DIAGNOSIS — M545 Low back pain: Secondary | ICD-10-CM | POA: Diagnosis not present

## 2018-12-18 NOTE — Telephone Encounter (Signed)
No los per 7/20.

## 2018-12-24 DIAGNOSIS — M48061 Spinal stenosis, lumbar region without neurogenic claudication: Secondary | ICD-10-CM | POA: Diagnosis not present

## 2019-01-04 ENCOUNTER — Other Ambulatory Visit: Payer: Self-pay | Admitting: Rheumatology

## 2019-01-04 NOTE — Telephone Encounter (Signed)
Last Visit: 10/17/18 Next Visit: 02/12/19 Labs: 12/17/18 WBC 12.8 Neutro Abs 8.6 Monocytes absolute 1.6 Glucose 158 Creat. 1.12 GFR 45 Per Dr. Estanislado Pandy reduce to 5 tabs  Patient advised.   Okay to refill per Dr. Estanislado Pandy

## 2019-01-08 DIAGNOSIS — M48061 Spinal stenosis, lumbar region without neurogenic claudication: Secondary | ICD-10-CM | POA: Diagnosis not present

## 2019-01-11 ENCOUNTER — Other Ambulatory Visit: Payer: Self-pay

## 2019-01-11 DIAGNOSIS — Z79899 Other long term (current) drug therapy: Secondary | ICD-10-CM

## 2019-01-12 LAB — COMPLETE METABOLIC PANEL WITH GFR
AG Ratio: 1.5 (calc) (ref 1.0–2.5)
ALT: 14 U/L (ref 6–29)
AST: 13 U/L (ref 10–35)
Albumin: 4.1 g/dL (ref 3.6–5.1)
Alkaline phosphatase (APISO): 44 U/L (ref 37–153)
BUN: 20 mg/dL (ref 7–25)
CO2: 26 mmol/L (ref 20–32)
Calcium: 9.8 mg/dL (ref 8.6–10.4)
Chloride: 103 mmol/L (ref 98–110)
Creat: 0.83 mg/dL (ref 0.60–0.88)
GFR, Est African American: 76 mL/min/{1.73_m2} (ref >=60)
GFR, Est Non African American: 65 mL/min/{1.73_m2} (ref >=60)
Globulin: 2.8 g/dL (calc) (ref 1.9–3.7)
Glucose, Bld: 150 mg/dL — ABNORMAL HIGH (ref 65–99)
Potassium: 4.7 mmol/L (ref 3.5–5.3)
Sodium: 139 mmol/L (ref 135–146)
Total Bilirubin: 0.6 mg/dL (ref 0.2–1.2)
Total Protein: 6.9 g/dL (ref 6.1–8.1)

## 2019-01-12 LAB — CBC WITH DIFFERENTIAL/PLATELET
Absolute Monocytes: 1325 cells/uL — ABNORMAL HIGH (ref 200–950)
Basophils Absolute: 21 cells/uL (ref 0–200)
Basophils Relative: 0.2 %
Eosinophils Absolute: 74 cells/uL (ref 15–500)
Eosinophils Relative: 0.7 %
HCT: 38.3 % (ref 35.0–45.0)
Hemoglobin: 13 g/dL (ref 11.7–15.5)
Lymphs Abs: 2141 cells/uL (ref 850–3900)
MCH: 32.7 pg (ref 27.0–33.0)
MCHC: 33.9 g/dL (ref 32.0–36.0)
MCV: 96.2 fL (ref 80.0–100.0)
MPV: 10 fL (ref 7.5–12.5)
Monocytes Relative: 12.5 %
Neutro Abs: 7038 cells/uL (ref 1500–7800)
Neutrophils Relative %: 66.4 %
Platelets: 378 10*3/uL (ref 140–400)
RBC: 3.98 10*6/uL (ref 3.80–5.10)
RDW: 13.5 % (ref 11.0–15.0)
Total Lymphocyte: 20.2 %
WBC: 10.6 10*3/uL (ref 3.8–10.8)

## 2019-01-14 NOTE — Progress Notes (Signed)
WBC count returned to WNL.  Absolute monocytes remain mildly elevated.  Rest of CBC WNL.  We will continue to monitor.

## 2019-01-14 NOTE — Progress Notes (Signed)
Glucose is elevated.  Please notify patient and forward labs to her PCP.

## 2019-01-17 DIAGNOSIS — R195 Other fecal abnormalities: Secondary | ICD-10-CM | POA: Diagnosis not present

## 2019-01-17 DIAGNOSIS — Z794 Long term (current) use of insulin: Secondary | ICD-10-CM | POA: Diagnosis not present

## 2019-01-17 DIAGNOSIS — I2510 Atherosclerotic heart disease of native coronary artery without angina pectoris: Secondary | ICD-10-CM | POA: Diagnosis not present

## 2019-01-17 DIAGNOSIS — E119 Type 2 diabetes mellitus without complications: Secondary | ICD-10-CM | POA: Diagnosis not present

## 2019-01-21 NOTE — Progress Notes (Signed)
Absolute monocytes mildly elevated but stable.  Rest of CBC WNL.  We will continue to monitor.

## 2019-01-29 NOTE — Progress Notes (Signed)
Office Visit Note  Patient: Sophia Ward             Date of Birth: 10/23/1932           MRN: [id number]             PCP: [name], MD Referring: [name], MD Visit Date: 02/12/2019 Occupation: @GUAROCC @  Subjective:  Pain in bilateral trochanteric bursa.   She is on calcium and vitamin D supplement daily. Last DEXA on 05/15/2018, ordered by Dr. Buddy Duty, showed T score of -1.6 at left femur neck and no scan available for BMD comparison.  History of Present Illness: Sophia Ward is a 85 y.o. female with history of osteoarthritis and rheumatoid arthritis.  She has been taking methotrexate 5 tablets/week without any issues.  Patient states that she tried to decrease her prednisone and was unable to.  She increase her prednisone back to 5 mg and is feeling better.  She states her rheumatoid arthritis is well controlled.  She continues to have some discomfort in her bilateral trochanteric bursa.  None of the other joints are painful.  Activities of Daily Living:  Patient reports morning stiffness for 2 hours.   Patient Denies nocturnal pain.  Difficulty dressing/grooming: Denies Difficulty climbing stairs: Reports Difficulty getting out of chair: Reports Difficulty using hands for taps, buttons, cutlery, and/or writing: Denies  Review of Systems  Constitutional: Positive for fatigue. Negative for night sweats, weight gain and weight loss.  HENT: Positive for mouth dryness. Negative for mouth sores, trouble swallowing, trouble swallowing and nose dryness.   Eyes: Negative for pain, redness, itching, visual disturbance and dryness.  Respiratory: Negative for cough, shortness of breath, wheezing and difficulty breathing.   Cardiovascular: Negative for chest pain, palpitations, hypertension, irregular heartbeat and swelling in legs/feet.  Gastrointestinal: Positive for diarrhea. Negative for blood in stool and constipation.  Endocrine: Negative for excessive thirst and  increased urination.  Genitourinary: Positive for difficulty urinating and painful urination. Negative for vaginal dryness.  Musculoskeletal: Positive for arthralgias, joint pain and morning stiffness. Negative for joint swelling, myalgias, muscle weakness, muscle tenderness and myalgias.  Skin: Negative for color change, rash, hair loss, skin tightness, ulcers and sensitivity to sunlight.  Allergic/Immunologic: Negative for susceptible to infections.  Neurological: Negative for dizziness, headaches, memory loss, night sweats and weakness.  Hematological: Positive for bruising/bleeding tendency. Negative for swollen glands.  Psychiatric/Behavioral: Negative for depressed mood, confusion and sleep disturbance. The patient is not nervous/anxious.     PMFS History:  Patient Active Problem List   Diagnosis Date Noted  . Polymyalgia rheumatica ([place]) 11/25/2016  . Primary osteoarthritis of both hands 11/25/2016  . Bilateral primary osteoarthritis of knee 11/25/2016  . Osteopenia of multiple sites 11/25/2016  . 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 Osteoarthritis of lumbar spine 10/28/2016  . History of diabetes mellitus 10/27/2016  . History of gastroesophageal reflux (GERD) 10/27/2016  . Rheumatoid arthritis involving multiple sites with positive rheumatoid factor ([place]) 10/14/2016  . Elevated C-reactive protein (CRP) 10/14/2016  . Elevated sed rate 10/14/2016  . High risk medication use 10/14/2016  . Coronary atherosclerosis of native coronary artery 01/30/2014  . Mixed hyperlipidemia 01/30/2014  . Essential hypertension, benign 01/30/2014  . Obesity, unspecified 01/30/2014    Past Medical History:  Diagnosis Date  . Allergic rhinitis   . Anemia   . Arthritis    hands  . BPPV (benign paroxysmal positional vertigo)   . Coronary artery disease   .  Coronary atherosclerosis of native coronary artery 01/30/2014  . Diabetes mellitus without complication ([place])   .  Essential hypertension, benign 01/30/2014  . GERD (gastroesophageal reflux disease)   . Hypertension   . Mixed hyperlipidemia 01/30/2014  . Myocardial infarction ([place]) 2004   mild MI-no damage- had stents  . Osteopenia   . Post-menopausal   . Stenosing tenosynovitis     Family History  Problem Relation Age of Onset  . CAD Mother   . Heart attack Mother   . CAD Father   . Heart Problems Brother        open heart surgery   . Arthritis Daughter        psoriatic arthritis    Past Surgical History:  Procedure Laterality Date  . ABDOMINAL HYSTERECTOMY    . APPENDECTOMY    . CARDIAC CATHETERIZATION  2004  . carpel tunnel    . COLONOSCOPY WITH PROPOFOL N/A 10/30/2012   Procedure: COLONOSCOPY WITH PROPOFOL;  Surgeon: Garlan Fair, MD;  Location: [facility];  Service: Endoscopy;  Laterality: N/A;  . CORONARY ANGIOPLASTY  2004  . DILATION AND CURETTAGE OF UTERUS    . ESOPHAGOGASTRODUODENOSCOPY (EGD) WITH PROPOFOL N/A 10/30/2012   Procedure: ESOPHAGOGASTRODUODENOSCOPY (EGD) WITH PROPOFOL;  Surgeon: Garlan Fair, MD;  Location: [facility];  Service: Endoscopy;  Laterality: N/A;  . EYE SURGERY     bilateral cataract with lens implant  . EYE SURGERY    . left shouler surgery    . TONSILLECTOMY     Social History   Social History Narrative  . Not on file   Immunization History  Administered Date(s) Administered  . Influenza, High Dose Seasonal PF 03/09/2014, 04/09/2015, 03/16/2016, 02/25/2017, 03/20/2018  . Zoster Recombinat (Shingrix) 02/07/2018, 06/04/2018     Objective: Vital Signs: BP (!) 162/73 (BP Location: Left Arm, Patient Position: Sitting, Cuff Size: Large)   Pulse 72   Resp 14   Ht 5' 2.5" (1.588 m)   Wt 172 lb (78 kg)   BMI 30.96 kg/m    Physical Exam Vitals signs and nursing note reviewed.  Constitutional:      Appearance: She is well-developed.  HENT:     Head: Normocephalic and atraumatic.  Eyes:     Conjunctiva/sclera: Conjunctivae normal.  Neck:      Musculoskeletal: Normal range of motion.  Cardiovascular:     Rate and Rhythm: Normal rate and regular rhythm.     Heart sounds: Normal heart sounds.  Pulmonary:     Effort: Pulmonary effort is normal.     Breath sounds: Normal breath sounds.  Abdominal:     General: Bowel sounds are normal.     Palpations: Abdomen is soft.  Lymphadenopathy:     Cervical: No cervical adenopathy.  Skin:    General: Skin is warm and dry.     Capillary Refill: Capillary refill takes less than 2 seconds.  Neurological:     Mental Status: She is alert and oriented to person, place, and time.  Psychiatric:        Behavior: Behavior normal.      Musculoskeletal Exam: C-spine was full range of motion.  Shoulder joints, elbow joints, wrist joints with good range of motion.  She has DIP and PIP thickening with no synovitis.  She had tenderness on palpation of bilateral trochanteric bursa.  Knee joints ankles MTPs with good range of motion with no synovitis.  CDAI Exam: CDAI Score: 0.4  Patient Global: 2 mm; Provider Global: 2 mm Swollen:  0 ; Tender: 0  Joint Exam   No joint exam has been documented for this visit   There is currently no information documented on the homunculus. Go to the Rheumatology activity and complete the homunculus joint exam.  Investigation: No additional findings.  Imaging: No results found.  Recent Labs: Lab Results  Component Value Date   WBC 10.6 01/11/2019   HGB 13.0 01/11/2019   PLT 378 01/11/2019   NA 139 01/11/2019   K 4.7 01/11/2019   CL 103 01/11/2019   CO2 26 01/11/2019   GLUCOSE 150 (H) 01/11/2019   BUN 20 01/11/2019   CREATININE 0.83 01/11/2019   BILITOT 0.6 01/11/2019   ALKPHOS 47 12/17/2018   AST 13 01/11/2019   ALT 14 01/11/2019   PROT 6.9 01/11/2019   ALBUMIN 3.6 12/17/2018   CALCIUM 9.8 01/11/2019   GFRAA 76 01/11/2019    Speciality Comments: No specialty comments available.  Procedures:  No procedures performed Allergies: Codeine  and Penicillins   Assessment / Plan:     Visit Diagnoses: Rheumatoid arthritis involving multiple sites with positive rheumatoid factor (HCC) - RF 309, ANA negative, CRP 5.8, synovitis, treated by Dr. Philbert Riser in [place] during the winter months.  Patient's rheumatoid arthritis is well controlled with methotrexate 5 tablets p.o. weekly.  High risk medication use - Methotrexate 2.5 mg 5 tablet every 7 days, folic acid 1 mg 2 tablets daily, and prednisone (current dose?).  Most recent CBC/CMP within normal limits on 01/11/2019.  Next CBC/CMP due in November and will monitor every 3 months. Standing orders are in place.   Polymyalgia rheumatica (HCC) -patient is increase prednisone to 5 mg p.o. daily.  She states she could not taper the medication.  Side effects of prednisone were again discussed at length.  Have advised her to try to taper to 4 mg p.o. daily.  A prescription refill for prednisone was given.  She will be moving to [place] for the next 6 months.  Primary osteoarthritis of both hands-she has some stiffness but no synovitis.  Primary osteoarthritis of both knees-doing well.  Primary osteoarthritis of both feet  Trochanteric bursitis, right hip-she has tenderness over bilateral trochanteric bursa consistent with trochanteric bursitis.  She had  inadequate response to cortisone injection in the past.  I offered physical therapy but she declined.  Osteopenia of multiple sites - her most recent bone density was December 2019 by her PCP.  History of hypertension-her systolic blood pressure was elevated today.  She believes is related to the anxiety she is having from the surgery she is going to have this afternoon.  History of diabetes mellitus  History of hyperlipidemia  History of gastroesophageal reflux (GERD)  Orders: Orders Placed This Encounter  Procedures  . CBC with Differential/Platelet  . COMPLETE METABOLIC PANEL WITH GFR   Meds ordered this encounter   Medications  . predniSONE (DELTASONE) 1 MG tablet    Sig: Take 4 tablets (4 mg total) by mouth daily with breakfast.    Dispense:  360 tablet    Refill:  0     Follow-Up Instructions: Return in about 5 months (around 07/15/2019) for Rheumatoid arthritis.   Bo Merino, MD  Note - This record has been created using Editor, commissioning.  Chart creation errors have been sought, but may not always  have been located. Such creation errors do not reflect on  the standard of medical care.

## 2019-01-31 DIAGNOSIS — Z961 Presence of intraocular lens: Secondary | ICD-10-CM | POA: Diagnosis not present

## 2019-01-31 DIAGNOSIS — E119 Type 2 diabetes mellitus without complications: Secondary | ICD-10-CM | POA: Diagnosis not present

## 2019-01-31 DIAGNOSIS — H11221 Conjunctival granuloma, right eye: Secondary | ICD-10-CM | POA: Diagnosis not present

## 2019-01-31 DIAGNOSIS — M0570 Rheumatoid arthritis with rheumatoid factor of unspecified site without organ or systems involvement: Secondary | ICD-10-CM | POA: Diagnosis not present

## 2019-01-31 DIAGNOSIS — H524 Presbyopia: Secondary | ICD-10-CM | POA: Diagnosis not present

## 2019-02-11 DIAGNOSIS — H35372 Puckering of macula, left eye: Secondary | ICD-10-CM | POA: Diagnosis not present

## 2019-02-11 DIAGNOSIS — H3522 Other non-diabetic proliferative retinopathy, left eye: Secondary | ICD-10-CM | POA: Diagnosis not present

## 2019-02-11 DIAGNOSIS — H31092 Other chorioretinal scars, left eye: Secondary | ICD-10-CM | POA: Diagnosis not present

## 2019-02-11 DIAGNOSIS — H3581 Retinal edema: Secondary | ICD-10-CM | POA: Diagnosis not present

## 2019-02-11 DIAGNOSIS — H35371 Puckering of macula, right eye: Secondary | ICD-10-CM | POA: Diagnosis not present

## 2019-02-11 DIAGNOSIS — H53452 Other localized visual field defect, left eye: Secondary | ICD-10-CM | POA: Diagnosis not present

## 2019-02-11 DIAGNOSIS — H3342 Traction detachment of retina, left eye: Secondary | ICD-10-CM | POA: Diagnosis not present

## 2019-02-11 DIAGNOSIS — Z961 Presence of intraocular lens: Secondary | ICD-10-CM | POA: Diagnosis not present

## 2019-02-12 ENCOUNTER — Ambulatory Visit (INDEPENDENT_AMBULATORY_CARE_PROVIDER_SITE_OTHER): Payer: Medicare Other | Admitting: Rheumatology

## 2019-02-12 ENCOUNTER — Inpatient Hospital Stay (HOSPITAL_COMMUNITY): Payer: Medicare Other | Admitting: Anesthesiology

## 2019-02-12 ENCOUNTER — Encounter: Payer: Self-pay | Admitting: Rheumatology

## 2019-02-12 ENCOUNTER — Ambulatory Visit (HOSPITAL_COMMUNITY)
Admission: RE | Admit: 2019-02-12 | Discharge: 2019-02-12 | Disposition: A | Discharge status: Home / Self Care | Payer: Medicare Other | Source: Ambulatory Visit | Attending: Ophthalmology | Admitting: Ophthalmology

## 2019-02-12 ENCOUNTER — Other Ambulatory Visit: Payer: Self-pay

## 2019-02-12 ENCOUNTER — Encounter (HOSPITAL_COMMUNITY): Payer: Self-pay

## 2019-02-12 ENCOUNTER — Encounter (HOSPITAL_COMMUNITY)
Admission: RE | Disposition: A | Discharge status: Home / Self Care | Payer: Self-pay | Source: Ambulatory Visit | Attending: Ophthalmology

## 2019-02-12 VITALS — BP 162/73 | HR 72 | Resp 14 | Ht 62.5 in | Wt 172.0 lb

## 2019-02-12 DIAGNOSIS — Z955 Presence of coronary angioplasty implant and graft: Secondary | ICD-10-CM | POA: Diagnosis not present

## 2019-02-12 DIAGNOSIS — M19041 Primary osteoarthritis, right hand: Secondary | ICD-10-CM | POA: Diagnosis not present

## 2019-02-12 DIAGNOSIS — Z8639 Personal history of other endocrine, nutritional and metabolic disease: Secondary | ICD-10-CM | POA: Diagnosis not present

## 2019-02-12 DIAGNOSIS — M353 Polymyalgia rheumatica: Secondary | ICD-10-CM | POA: Diagnosis not present

## 2019-02-12 DIAGNOSIS — E119 Type 2 diabetes mellitus without complications: Secondary | ICD-10-CM | POA: Diagnosis not present

## 2019-02-12 DIAGNOSIS — Z8679 Personal history of other diseases of the circulatory system: Secondary | ICD-10-CM | POA: Diagnosis not present

## 2019-02-12 DIAGNOSIS — M8580 Other specified disorders of bone density and structure, unspecified site: Secondary | ICD-10-CM | POA: Insufficient documentation

## 2019-02-12 DIAGNOSIS — I252 Old myocardial infarction: Secondary | ICD-10-CM | POA: Diagnosis not present

## 2019-02-12 DIAGNOSIS — M19042 Primary osteoarthritis, left hand: Secondary | ICD-10-CM

## 2019-02-12 DIAGNOSIS — E782 Mixed hyperlipidemia: Secondary | ICD-10-CM | POA: Diagnosis not present

## 2019-02-12 DIAGNOSIS — Z79899 Other long term (current) drug therapy: Secondary | ICD-10-CM

## 2019-02-12 DIAGNOSIS — K219 Gastro-esophageal reflux disease without esophagitis: Secondary | ICD-10-CM | POA: Diagnosis not present

## 2019-02-12 DIAGNOSIS — Z880 Allergy status to penicillin: Secondary | ICD-10-CM | POA: Insufficient documentation

## 2019-02-12 DIAGNOSIS — M7061 Trochanteric bursitis, right hip: Secondary | ICD-10-CM

## 2019-02-12 DIAGNOSIS — M8589 Other specified disorders of bone density and structure, multiple sites: Secondary | ICD-10-CM | POA: Diagnosis not present

## 2019-02-12 DIAGNOSIS — M170 Bilateral primary osteoarthritis of knee: Secondary | ICD-10-CM | POA: Diagnosis not present

## 2019-02-12 DIAGNOSIS — M19071 Primary osteoarthritis, right ankle and foot: Secondary | ICD-10-CM | POA: Diagnosis not present

## 2019-02-12 DIAGNOSIS — H3342 Traction detachment of retina, left eye: Secondary | ICD-10-CM | POA: Insufficient documentation

## 2019-02-12 DIAGNOSIS — I10 Essential (primary) hypertension: Secondary | ICD-10-CM | POA: Diagnosis not present

## 2019-02-12 DIAGNOSIS — I2510 Atherosclerotic heart disease of native coronary artery without angina pectoris: Secondary | ICD-10-CM | POA: Diagnosis not present

## 2019-02-12 DIAGNOSIS — M19072 Primary osteoarthritis, left ankle and foot: Secondary | ICD-10-CM

## 2019-02-12 DIAGNOSIS — M0579 Rheumatoid arthritis with rheumatoid factor of multiple sites without organ or systems involvement: Secondary | ICD-10-CM

## 2019-02-12 DIAGNOSIS — Z885 Allergy status to narcotic agent status: Secondary | ICD-10-CM | POA: Insufficient documentation

## 2019-02-12 DIAGNOSIS — H3522 Other non-diabetic proliferative retinopathy, left eye: Secondary | ICD-10-CM | POA: Diagnosis not present

## 2019-02-12 DIAGNOSIS — Z8719 Personal history of other diseases of the digestive system: Secondary | ICD-10-CM

## 2019-02-12 DIAGNOSIS — Z20828 Contact with and (suspected) exposure to other viral communicable diseases: Secondary | ICD-10-CM | POA: Insufficient documentation

## 2019-02-12 HISTORY — PX: INJECTION OF SILICONE OIL: SHX6422

## 2019-02-12 HISTORY — PX: REPAIR OF COMPLEX TRACTION RETINAL DETACHMENT: SHX6217

## 2019-02-12 HISTORY — PX: VITRECTOMY 25 GAUGE WITH SCLERAL BUCKLE: SHX6183

## 2019-02-12 HISTORY — PX: PHOTOCOAGULATION WITH LASER: SHX6027

## 2019-02-12 LAB — BASIC METABOLIC PANEL
Anion gap: 15 (ref 5–15)
BUN: 15 mg/dL (ref 8–23)
CO2: 22 mmol/L (ref 22–32)
Calcium: 9.4 mg/dL (ref 8.9–10.3)
Chloride: 102 mmol/L (ref 98–111)
Creatinine, Ser: 0.94 mg/dL (ref 0.44–1.00)
GFR calc Af Amer: >60 mL/min (ref >60)
GFR calc non Af Amer: 56 mL/min — ABNORMAL LOW (ref >60)
Glucose, Bld: 152 mg/dL — ABNORMAL HIGH (ref 70–99)
Potassium: 3.5 mmol/L (ref 3.5–5.1)
Sodium: 139 mmol/L (ref 135–145)

## 2019-02-12 LAB — CBC
HCT: 41.6 % (ref 36.0–46.0)
Hemoglobin: 13.4 g/dL (ref 12.0–15.0)
MCH: 31.8 pg (ref 26.0–34.0)
MCHC: 32.2 g/dL (ref 30.0–36.0)
MCV: 98.8 fL (ref 80.0–100.0)
Platelets: 437 10*3/uL — ABNORMAL HIGH (ref 150–400)
RBC: 4.21 MIL/uL (ref 3.87–5.11)
RDW: 15 % (ref 11.5–15.5)
WBC: 16.9 10*3/uL — ABNORMAL HIGH (ref 4.0–10.5)
nRBC: 0 % (ref 0.0–0.2)

## 2019-02-12 LAB — GLUCOSE, CAPILLARY
Glucose-Capillary: 143 mg/dL — ABNORMAL HIGH (ref 70–99)
Glucose-Capillary: 119 mg/dL — ABNORMAL HIGH (ref 70–99)
Glucose-Capillary: 136 mg/dL — ABNORMAL HIGH (ref 70–99)

## 2019-02-12 LAB — SARS CORONAVIRUS 2 BY RT PCR (HOSPITAL ORDER, PERFORMED IN ~~LOC~~ HOSPITAL LAB): SARS Coronavirus 2: NEGATIVE

## 2019-02-12 SURGERY — REPAIR, RETINAL DETACHMENT, COMPLEX
Anesthesia: Monitor Anesthesia Care | Site: Eye | Laterality: Left

## 2019-02-12 MED ORDER — CYCLOPENTOLATE HCL 1 % OP SOLN
1.0000 [drp] | OPHTHALMIC | Status: AC | PRN
  Administered 2019-02-12 (×3): 1 [drp] via OPHTHALMIC
  Filled 2019-02-12: qty 2

## 2019-02-12 MED ORDER — PROPOFOL 10 MG/ML IV BOLUS
INTRAVENOUS | Status: AC
  Filled 2019-02-12: qty 40

## 2019-02-12 MED ORDER — BSS IO SOLN
INTRAOCULAR | Status: AC
  Filled 2019-02-12: qty 15

## 2019-02-12 MED ORDER — TOBRAMYCIN-DEXAMETHASONE 0.3-0.1 % OP OINT
TOPICAL_OINTMENT | OPHTHALMIC | Status: AC
  Filled 2019-02-12: qty 3.5

## 2019-02-12 MED ORDER — SODIUM CHLORIDE 0.9 % IV SOLN
INTRAVENOUS | Status: DC
  Administered 2019-02-12: 15:00:00 via INTRAVENOUS

## 2019-02-12 MED ORDER — HYPROMELLOSE (GONIOSCOPIC) 2.5 % OP SOLN
OPHTHALMIC | Status: AC
  Filled 2019-02-12: qty 15

## 2019-02-12 MED ORDER — LIDOCAINE 2% (20 MG/ML) 5 ML SYRINGE
INTRAMUSCULAR | Status: AC
  Filled 2019-02-12: qty 5

## 2019-02-12 MED ORDER — EPINEPHRINE PF 1 MG/ML IJ SOLN
INTRAOCULAR | Status: DC | PRN
  Administered 2019-02-12: 500 mL

## 2019-02-12 MED ORDER — BUPIVACAINE HCL (PF) 0.75 % IJ SOLN
INTRAMUSCULAR | Status: AC
  Filled 2019-02-12: qty 10

## 2019-02-12 MED ORDER — ONDANSETRON HCL 4 MG/2ML IJ SOLN
INTRAMUSCULAR | Status: AC
  Filled 2019-02-12: qty 2

## 2019-02-12 MED ORDER — 0.9 % SODIUM CHLORIDE (POUR BTL) OPTIME
TOPICAL | Status: DC | PRN
  Administered 2019-02-12: 1000 mL

## 2019-02-12 MED ORDER — LIDOCAINE 2% (20 MG/ML) 5 ML SYRINGE
INTRAMUSCULAR | Status: DC | PRN
  Administered 2019-02-12: 60 mg via INTRAVENOUS

## 2019-02-12 MED ORDER — HYPROMELLOSE (GONIOSCOPIC) 2.5 % OP SOLN
OPHTHALMIC | Status: DC | PRN
  Administered 2019-02-12: 5 [drp] via OPHTHALMIC

## 2019-02-12 MED ORDER — TOBRAMYCIN-DEXAMETHASONE 0.3-0.1 % OP OINT
TOPICAL_OINTMENT | OPHTHALMIC | Status: DC | PRN
  Administered 2019-02-12: 1 via OPHTHALMIC

## 2019-02-12 MED ORDER — VANCOMYCIN SUBCONJUNCTIVAL INJECTION 25 MG/0.5 ML
INTRAOCULAR | Status: DC | PRN
  Administered 2019-02-12: 25 mg via SUBCONJUNCTIVAL

## 2019-02-12 MED ORDER — GLYCOPYRROLATE PF 0.2 MG/ML IJ SOSY
PREFILLED_SYRINGE | INTRAMUSCULAR | Status: DC | PRN
  Administered 2019-02-12: .2 mg via INTRAVENOUS

## 2019-02-12 MED ORDER — ATROPINE SULFATE 1 % OP SOLN
OPHTHALMIC | Status: AC
  Filled 2019-02-12: qty 5

## 2019-02-12 MED ORDER — ONDANSETRON HCL 4 MG/2ML IJ SOLN
INTRAMUSCULAR | Status: DC | PRN
  Administered 2019-02-12: 4 mg via INTRAVENOUS

## 2019-02-12 MED ORDER — METOPROLOL TARTRATE 5 MG/5ML IV SOLN
3.0000 mg | Freq: Once | INTRAVENOUS | Status: AC
  Administered 2019-02-12: 14:00:00 3 mg via INTRAVENOUS

## 2019-02-12 MED ORDER — FENTANYL CITRATE (PF) 250 MCG/5ML IJ SOLN
INTRAMUSCULAR | Status: DC | PRN
  Administered 2019-02-12: 50 ug via INTRAVENOUS
  Administered 2019-02-12: 100 ug via INTRAVENOUS

## 2019-02-12 MED ORDER — FENTANYL CITRATE (PF) 100 MCG/2ML IJ SOLN: 25.0000 ug | INTRAMUSCULAR | Status: DC | PRN

## 2019-02-12 MED ORDER — TETRACAINE HCL 0.5 % OP SOLN
OPHTHALMIC | Status: AC
  Filled 2019-02-12: qty 4

## 2019-02-12 MED ORDER — HYALURONIDASE HUMAN 150 UNIT/ML IJ SOLN
INTRAMUSCULAR | Status: AC
  Filled 2019-02-12: qty 1

## 2019-02-12 MED ORDER — BSS PLUS IO SOLN
INTRAOCULAR | Status: AC
  Filled 2019-02-12: qty 500

## 2019-02-12 MED ORDER — PHENYLEPHRINE HCL 2.5 % OP SOLN
1.0000 [drp] | OPHTHALMIC | Status: AC | PRN
  Administered 2019-02-12 (×3): 1 [drp] via OPHTHALMIC
  Filled 2019-02-12: qty 2

## 2019-02-12 MED ORDER — EPINEPHRINE PF 1 MG/ML IJ SOLN
INTRAMUSCULAR | Status: AC
  Filled 2019-02-12: qty 1

## 2019-02-12 MED ORDER — STERILE WATER FOR IRRIGATION IR SOLN
Status: DC | PRN
  Administered 2019-02-12: 1000 mL

## 2019-02-12 MED ORDER — LIDOCAINE HCL 2 % IJ SOLN
INTRAMUSCULAR | Status: AC
  Filled 2019-02-12: qty 20

## 2019-02-12 MED ORDER — ONDANSETRON HCL 4 MG/2ML IJ SOLN: 4.0000 mg | Freq: Four times a day (QID) | INTRAMUSCULAR | Status: DC | PRN

## 2019-02-12 MED ORDER — OXYCODONE HCL 5 MG/5ML PO SOLN: 5.0000 mg | Freq: Once | ORAL | Status: DC | PRN

## 2019-02-12 MED ORDER — LIDOCAINE HCL 2 % IJ SOLN
INTRAMUSCULAR | Status: DC | PRN
  Administered 2019-02-12: 7 mL via RETROBULBAR

## 2019-02-12 MED ORDER — OXYCODONE HCL 5 MG PO TABS: 5.0000 mg | ORAL_TABLET | Freq: Once | ORAL | Status: DC | PRN

## 2019-02-12 MED ORDER — BSS IO SOLN
INTRAOCULAR | Status: DC | PRN
  Administered 2019-02-12: 15 mL via INTRAOCULAR

## 2019-02-12 MED ORDER — GLYCOPYRROLATE PF 0.2 MG/ML IJ SOSY
PREFILLED_SYRINGE | INTRAMUSCULAR | Status: AC
  Filled 2019-02-12: qty 1

## 2019-02-12 MED ORDER — DEXAMETHASONE SODIUM PHOSPHATE 10 MG/ML IJ SOLN
INTRAMUSCULAR | Status: DC | PRN
  Administered 2019-02-12: 10 mg

## 2019-02-12 MED ORDER — PREDNISONE 1 MG PO TABS: 4.0000 mg | ORAL_TABLET | Freq: Every day | ORAL | 0 refills | Status: DC

## 2019-02-12 MED ORDER — DEXAMETHASONE SODIUM PHOSPHATE 10 MG/ML IJ SOLN
INTRAMUSCULAR | Status: AC
  Filled 2019-02-12: qty 1

## 2019-02-12 MED ORDER — VANCOMYCIN SUBCONJUNCTIVAL INJECTION 25 MG/0.5 ML
25.0000 mg | INTRAOCULAR | Status: DC
  Filled 2019-02-12: qty 0.5

## 2019-02-12 MED ORDER — SUCCINYLCHOLINE CHLORIDE 200 MG/10ML IV SOSY
PREFILLED_SYRINGE | INTRAVENOUS | Status: DC | PRN
  Administered 2019-02-12: 100 mg via INTRAVENOUS

## 2019-02-12 MED ORDER — SUCCINYLCHOLINE CHLORIDE 200 MG/10ML IV SOSY
PREFILLED_SYRINGE | INTRAVENOUS | Status: AC
  Filled 2019-02-12: qty 10

## 2019-02-12 MED ORDER — PROPOFOL 10 MG/ML IV BOLUS
INTRAVENOUS | Status: DC | PRN
  Administered 2019-02-12: 140 mg via INTRAVENOUS

## 2019-02-12 MED ORDER — METOPROLOL TARTRATE 5 MG/5ML IV SOLN
INTRAVENOUS | Status: AC
  Administered 2019-02-12: 3 mg via INTRAVENOUS
  Filled 2019-02-12: qty 5

## 2019-02-12 MED ORDER — FENTANYL CITRATE (PF) 250 MCG/5ML IJ SOLN
INTRAMUSCULAR | Status: AC
  Filled 2019-02-12: qty 5

## 2019-02-12 SURGICAL SUPPLY — 50 items
APL SWBSTK 6 STRL LF DISP (MISCELLANEOUS) ×2
APPLICATOR COTTON TIP 6 STRL (MISCELLANEOUS) ×1 IMPLANT
APPLICATOR COTTON TIP 6IN STRL (MISCELLANEOUS) ×4
BAND SCLERAL BUCKLING TYPE 42 (Ophthalmic Related) ×1 IMPLANT
CABLE BIPOLOR RESECTION CORD (MISCELLANEOUS) ×1 IMPLANT
CANNULA DUALBORE 25G (CANNULA) ×1 IMPLANT
CANNULA VLV SOFT TIP 25G (OPHTHALMIC) ×1 IMPLANT
CANNULA VLV SOFT TIP 25GA (OPHTHALMIC) ×2 IMPLANT
CLSR STERI-STRIP ANTIMIC 1/2X4 (GAUZE/BANDAGES/DRESSINGS) ×2 IMPLANT
DRAPE HALF SHEET 40X57 (DRAPES) ×2 IMPLANT
FORCEPS GRIESHABER ILM 25G A (INSTRUMENTS) ×1 IMPLANT
GLOVE SURG SYN 7.5  E (GLOVE) ×1
GLOVE SURG SYN 7.5 E (GLOVE) ×1 IMPLANT
GLOVE SURG SYN 7.5 PF PI (GLOVE) ×1 IMPLANT
GOWN STRL REUS W/ TWL LRG LVL3 (GOWN DISPOSABLE) ×1 IMPLANT
GOWN STRL REUS W/TWL LRG LVL3 (GOWN DISPOSABLE) ×6
KIT BASIN OR (CUSTOM PROCEDURE TRAY) ×2 IMPLANT
KIT PERFLUORON PROCEDURE 5ML (MISCELLANEOUS) ×1 IMPLANT
KIT TURNOVER KIT B (KITS) ×2 IMPLANT
LENS BIOM SUPER VIEW SET DISP (MISCELLANEOUS) ×2 IMPLANT
NDL 18GX1X1/2 (RX/OR ONLY) (NEEDLE) ×1 IMPLANT
NDL 25GX 5/8IN NON SAFETY (NEEDLE) ×1 IMPLANT
NDL FILTER BLUNT 18X1 1/2 (NEEDLE) ×1 IMPLANT
NDL HYPO 30X.5 LL (NEEDLE) ×2 IMPLANT
NDL RETROBULBAR 25GX1.5 (NEEDLE) ×1 IMPLANT
NEEDLE 18GX1X1/2 (RX/OR ONLY) (NEEDLE) ×4 IMPLANT
NEEDLE 25GX 5/8IN NON SAFETY (NEEDLE) ×2 IMPLANT
NEEDLE FILTER BLUNT 18X 1/2SAF (NEEDLE) ×1
NEEDLE FILTER BLUNT 18X1 1/2 (NEEDLE) ×1 IMPLANT
NEEDLE HYPO 30X.5 LL (NEEDLE) ×4 IMPLANT
NEEDLE RETROBULBAR 25GX1.5 (NEEDLE) ×2 IMPLANT
NS IRRIG 1000ML POUR BTL (IV SOLUTION) ×2 IMPLANT
OIL SILICONE OPHTHALMIC ADAPTO (Ophthalmic Related) ×1 IMPLANT
PACK VITRECTOMY CUSTOM (CUSTOM PROCEDURE TRAY) ×2 IMPLANT
PAD ARMBOARD 7.5X6 YLW CONV (MISCELLANEOUS) ×4 IMPLANT
PAK PIK VITRECTOMY CVS 25GA (OPHTHALMIC) ×2 IMPLANT
PENCIL BIPOLAR 25GA STR DISP (OPHTHALMIC RELATED) ×1 IMPLANT
PROBE LASER ILLUM FLEX CVD 25G (OPHTHALMIC) ×1 IMPLANT
SET INJECTOR OIL FLUID CONSTEL (OPHTHALMIC) ×1 IMPLANT
SOL ANTI FOG 6CC (MISCELLANEOUS) ×1 IMPLANT
SOLUTION ANTI FOG 6CC (MISCELLANEOUS) ×1
SUT MERSILENE 5-0 (SUTURE) ×2 IMPLANT
SUT SILK 2 0 TIES 17X18 (SUTURE) ×2
SUT SILK 2-0 18XBRD TIE BLK (SUTURE) IMPLANT
SUT VICRYL 7 0 TG140 8 (SUTURE) ×1 IMPLANT
SUT VICRYL 8 0 TG140 8 (SUTURE) ×1 IMPLANT
SYR 20ML LL LF (SYRINGE) ×2 IMPLANT
SYR 5ML LL (SYRINGE) ×2 IMPLANT
SYR TB 1ML LUER SLIP (SYRINGE) ×1 IMPLANT
WATER STERILE IRR 1000ML POUR (IV SOLUTION) ×2 IMPLANT

## 2019-02-12 NOTE — Anesthesia Preprocedure Evaluation (Signed)
Anesthesia Evaluation  Patient identified by MRN, date of birth, ID band Patient awake    Reviewed: Allergy & Precautions, H&P , NPO status , Patient's Chart, lab work & pertinent test results  Airway Mallampati: II   Neck ROM: full    Dental   Pulmonary neg pulmonary ROS,    breath sounds clear to auscultation       Cardiovascular hypertension, + CAD, + Past MI and + Cardiac Stents   Rhythm:regular Rate:Normal     Neuro/Psych    GI/Hepatic GERD  ,  Endo/Other  diabetes  Renal/GU      Musculoskeletal  (+) Arthritis ,   Abdominal   Peds  Hematology   Anesthesia Other Findings   Reproductive/Obstetrics                             Anesthesia Physical Anesthesia Plan  ASA: III  Anesthesia Plan: MAC   Post-op Pain Management:    Induction: Intravenous  PONV Risk Score and Plan: 2 and Ondansetron, Propofol infusion and Treatment may vary due to age or medical condition  Airway Management Planned: Nasal Cannula  Additional Equipment:   Intra-op Plan:   Post-operative Plan:   Informed Consent: I have reviewed the patients History and Physical, chart, labs and discussed the procedure including the risks, benefits and alternatives for the proposed anesthesia with the patient or authorized representative who has indicated his/her understanding and acceptance.       Plan Discussed with: CRNA, Anesthesiologist and Surgeon  Anesthesia Plan Comments:         Anesthesia Quick Evaluation

## 2019-02-12 NOTE — Anesthesia Procedure Notes (Signed)
Procedure Name: Intubation Date/Time: 02/12/2019 3:07 PM Performed by: Alain Marion, CRNA Pre-anesthesia Checklist: Patient identified, Emergency Drugs available, Suction available and Patient being monitored Patient Re-evaluated:Patient Re-evaluated prior to induction Oxygen Delivery Method: Circle System Utilized Preoxygenation: Pre-oxygenation with 100% oxygen Induction Type: IV induction and Rapid sequence Ventilation: Mask ventilation without difficulty Laryngoscope Size: Miller and 2 Grade View: Grade I Tube type: Oral Tube size: 7.0 mm Number of attempts: 1 Airway Equipment and Method: Stylet Placement Confirmation: ETT inserted through vocal cords under direct vision,  positive ETCO2 and breath sounds checked- equal and bilateral Secured at: 21 cm Tube secured with: Tape Dental Injury: Teeth and Oropharynx as per pre-operative assessment

## 2019-02-12 NOTE — Discharge Instructions (Signed)
DO NOT SLEEP ON BACK, THE EYE PRESSURE CAN GO UP AND CAUSE VISION LOSS   SLEEP ON SIDE WITH NOSE TO PILLOW  DURING DAY KEEP UPRIGHT 

## 2019-02-12 NOTE — Brief Op Note (Signed)
02/12/2019  6:22 PM  PATIENT:  Sophia Ward  85 y.o. 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 PROCEDURE:  Procedure(s): REPAIR OF COMPLEX TRACTION RETINAL DETACHMENT (Left) Photocoagulation With Laser (Left) Vitrectomy 25 Gauge With Scleral Buckle (Left) Injection Of Silicone Oil (Left) Perflouron (Left) Retinectomy (Left) Membranectomy  (Left)  SURGEON:  Surgeon(s) and Role:    * Jalene Mullet, MD - Primary  PHYSICIAN ASSISTANT:   ASSISTANTS: none   ANESTHESIA:   local and general  EBL:  minimal  BLOOD ADMINISTERED:none  DRAINS: none   LOCAL MEDICATIONS USED:  MARCAINE    and LIDOCAINE   SPECIMEN:  No Specimen  DISPOSITION OF SPECIMEN:  N/A  COUNTS:  YES  TOURNIQUET:  * No tourniquets in log *  DICTATION: .Note written in EPIC  PLAN OF CARE: Discharge to home after PACU  PATIENT DISPOSITION:  PACU - hemodynamically stable.   Delay start of Pharmacological VTE agent (>24hrs) due to surgical blood loss or risk of bleeding: not applicable

## 2019-02-12 NOTE — Patient Instructions (Signed)
Standing Labs We placed an order today for your standing lab work.    Please come back and get your standing labs in November and every 5 months   We have open lab daily Monday through Thursday from 8:30-12:30 PM and 1:30-4:30 PM and Friday from 8:30-12:30 PM and 1:30 -4:00 PM at the office of Dr. Vic Esco.   You may experience shorter wait times on Monday and Friday afternoons. The office is located at [address], [address], [place], [place] [address] No appointment is necessary.   Labs are drawn by [facility].  You may receive a bill from [facility] for your lab work.  If you wish to have your labs drawn at another location, please call the office 24 hours in advance to send orders.  If you have any questions regarding directions or hours of operation,  please call [phone].   Just as a reminder please drink plenty of water prior to coming for your lab work. Thanks!   

## 2019-02-12 NOTE — H&P (Signed)
Date of examination:  02/12/19  Indication for surgery: TRD left eye  Pertinent past medical history:  Past Medical History:  Diagnosis Date  . Allergic rhinitis   . Anemia   . Arthritis    hands  . BPPV (benign paroxysmal positional vertigo)   . Coronary artery disease   . Coronary atherosclerosis of native coronary artery 01/30/2014  . Diabetes mellitus without complication ([place])   . Essential hypertension, benign 01/30/2014  . GERD (gastroesophageal reflux disease)   . Hypertension   . Mixed hyperlipidemia 01/30/2014  . Myocardial infarction ([place]) 2004   mild MI-no damage- had stents  . Osteopenia   . Post-menopausal   . Stenosing tenosynovitis     Pertinent ocular history:  Tractional retinal detachment left eye  Pertinent family history:  Family History  Problem Relation Age of Onset  . CAD Mother   . Heart attack Mother   . CAD Father   . Heart Problems Brother        open heart surgery   . Arthritis Daughter        psoriatic arthritis     General:  Healthy appearing patient in no distress.    Eyes:    Acuity OD 20/100  Anterior segment: Within normal limits       Fundus: TRD with inferior anterior loop traction left eye    Impression: Inferior tractional retinal detachment left eye  Plan: TRD repair with scleral buckle and silicone oil  Royston Cowper

## 2019-02-12 NOTE — Transfer of Care (Signed)
Immediate Anesthesia Transfer of Care Note  Patient: SANGEETA MORPHIS  Procedure(s) Performed: REPAIR OF COMPLEX TRACTION RETINAL DETACHMENT (Left Eye) Photocoagulation With Laser (Left Eye) Vitrectomy 25 Gauge With Scleral Buckle (Left Eye) Injection Of Silicone Oil (Left Eye)  Patient Location: PACU  Anesthesia Type:General  Level of Consciousness: awake, alert  and oriented  Airway & Oxygen Therapy: Patient Spontanous Breathing and Patient connected to face mask oxygen  Post-op Assessment: Report given to RN and Post -op Vital signs reviewed and stable  Post vital signs: Reviewed and stable  Last Vitals:  Vitals Value Taken Time  BP 151/57 02/12/19 1809  Temp    Pulse 93 02/12/19 1817  Resp 29 02/12/19 1817  SpO2 96 % 02/12/19 1817  Vitals shown include unvalidated device data.  Last Pain:  Vitals:   02/12/19 1250  TempSrc: Oral  PainSc: 0-No pain         Complications: No apparent anesthesia complications

## 2019-02-13 ENCOUNTER — Encounter (HOSPITAL_COMMUNITY): Payer: Self-pay | Admitting: Ophthalmology

## 2019-02-14 NOTE — Anesthesia Postprocedure Evaluation (Signed)
Anesthesia Post Note  Patient: Sophia Ward  Procedure(s) Performed: REPAIR OF COMPLEX TRACTION RETINAL DETACHMENT (Left Eye) Photocoagulation With Laser (Left Eye) Vitrectomy 25 Gauge With Scleral Buckle (Left Eye) Injection Of Silicone Oil (Left Eye)     Patient location during evaluation: PACU Anesthesia Type: MAC Level of consciousness: awake and alert Pain management: pain level controlled Vital Signs Assessment: post-procedure vital signs reviewed and stable Respiratory status: spontaneous breathing, nonlabored ventilation, respiratory function stable and patient connected to nasal cannula oxygen Cardiovascular status: stable and blood pressure returned to baseline Postop Assessment: no apparent nausea or vomiting Anesthetic complications: no    Last Vitals:  Vitals:   02/12/19 1825 02/12/19 1840  BP: (!) 144/64 (!) 139/54  Pulse: 93 88  Resp: 20 18  Temp:    SpO2: 97% 97%    Last Pain:  Vitals:   02/12/19 1840  TempSrc:   PainSc: 0-No pain                 Kyannah Climer

## 2019-02-20 ENCOUNTER — Telehealth: Payer: Self-pay | Admitting: Rheumatology

## 2019-02-20 DIAGNOSIS — H35372 Puckering of macula, left eye: Secondary | ICD-10-CM | POA: Diagnosis not present

## 2019-02-20 DIAGNOSIS — H35352 Cystoid macular degeneration, left eye: Secondary | ICD-10-CM | POA: Diagnosis not present

## 2019-02-20 DIAGNOSIS — H59812 Chorioretinal scars after surgery for detachment, left eye: Secondary | ICD-10-CM | POA: Diagnosis not present

## 2019-02-20 DIAGNOSIS — H40052 Ocular hypertension, left eye: Secondary | ICD-10-CM | POA: Diagnosis not present

## 2019-02-20 DIAGNOSIS — H31092 Other chorioretinal scars, left eye: Secondary | ICD-10-CM | POA: Diagnosis not present

## 2019-02-20 NOTE — Telephone Encounter (Signed)
Patient called requesting to speak with you directly regarding her Prednisone.  Patient states at her last appointment with Dr. Estanislado Pandy she recommended that she taper her dosage and another medication was mentioned (doesn't remember name of medication).  Patient states she will be leaving for [place] in 4 weeks and won't return until April and wants to make sure she understands what Dr. Estanislado Pandy wants her to do before she leaves.

## 2019-02-20 NOTE — Telephone Encounter (Signed)
Patient advised per office notes she is to taper to 4 mg of Prednisone. Patient sates she will call the office when she needs a refill while in [place].

## 2019-03-07 DIAGNOSIS — Z23 Encounter for immunization: Secondary | ICD-10-CM | POA: Diagnosis not present

## 2019-03-12 NOTE — Op Note (Addendum)
Sophia Ward 9/15//2018 Diagnosis: Tractional retinal detachment left eye  Procedure: Complex retinal detachment repair with scleral buckel repair, vitrectomy, membranectomy, retinectomy, endolaser, and silicone oil tamponade left eye Operative Eye:  left eye  Surgeon: Royston Cowper Estimated Blood Loss: minimal Specimens for Pathology:  None Complications: none    After informed consent was obtained, the patient was brought to the operating room and a time-out confirmed the correct operative eye as the left eye. General anesthesia was obtained in the left eye without complication  The  patient was prepped and draped in the usual fashion for ocular surgery on the  left eye .  A lid speculum was placed.  360 degree periotomy was made and tenons dissected back in each quadrant.  The sclera was free of massess,, ectasia or prominent vessels.  Each rectus muscle was isolated with muscle hooks and silk suture.  Mattress sutures of 5-0 Mersilene were placed in each quadrant approximately 2.5 mm from the muscle inseration and 5.5 mm apart to accomade a 41 band.  Each mattress suture was secured with bulldogs and a 41 band was placed under the rectus muscles and the preplaced mattress sutures.  The free ends were secured with a Watzke sleeve in the inferotempoal quadrant.  Adequate imbrication was attained.  Attention was directed to the intraocular portion of the procedure.    An infusion line and trocar was placed at the 4 o'clock position approximately 3.5 mm from the surgical limbus.   The infusion line was allowed to run and then clamped when placed at the cannula opening. The line was inserted and secured to the drape with an adhesive strip.   Active trocars/cannula were placed at the 10 and 2 o'clock positions approximately 3.5 mm from the surgical limbus. The cannula was visualized in the vitreous cavity.  The light pipe and vitreous cutter were inserted into the vitreous cavity and  any remained peripheral vitreous adjacent to the inferior tracional retinal detachment was removed.   Additional adjustment of the scleral buckle was performed to ensure adequate imbrication.  Care was taken to identify the tractional component and endocuatery used to delineate the area for retinectomy.  After adeqate endocautery, the vitector was used the make a retinectomy.  The inferior detachment tension was relieved and with the aid of the scleral buckle imbrication, the retina appeared to lay flat against the sclera.  The associated overlying membranes in the inferior retina was removed to allow additional mobilization and relief of tractional forces.  After membranectomy, the retina was flattened with a complete air/fluid exchange.  Endolaser was applied surrounding the retinectomy and inferiorly adjacent to the retinectomy in the area of the slceral buckle to ensure adequate support.  Retrobulbar block was placed for post-operative analgesia with a blunt cannula in the subtenons space.  99991111 centistoke silicone oil was injected in the eye.  Once a near complete fill was achieved, the trocars were sequentially removed and the sclerotomies closed with 8-0 Vicryl.   The silk suture was cut and removed.  Tenons and conjunctiva were reapproximated to the limbus with 8-0 Vicryl.  Subconjunctival injections of antibiotic and Decadron were placed.   The speculum and drapes were removed and the eye was patched with Polymixin/Bacitracin ophthalmic ointment. An eye shield was placed and the patient was revived from general anesthesia and transferred with stable vital signs to the post operative recovery area.  The patient tolerated the procedure well and no complications were noted.

## 2019-03-15 DIAGNOSIS — H35371 Puckering of macula, right eye: Secondary | ICD-10-CM | POA: Diagnosis not present

## 2019-03-15 DIAGNOSIS — H31092 Other chorioretinal scars, left eye: Secondary | ICD-10-CM | POA: Diagnosis not present

## 2019-04-04 ENCOUNTER — Encounter: Payer: Self-pay | Admitting: Rheumatology

## 2019-04-04 DIAGNOSIS — Z79899 Other long term (current) drug therapy: Secondary | ICD-10-CM | POA: Diagnosis not present

## 2019-04-08 ENCOUNTER — Telehealth: Payer: Self-pay | Admitting: Rheumatology

## 2019-04-08 NOTE — Telephone Encounter (Signed)
Patient called requesting prescription refill of Methotrexate to be sent to [facility] in [place], [place].   Patient states she only has enough pills for Wednesday, 04/10/19.

## 2019-04-09 DIAGNOSIS — M545 Low back pain: Secondary | ICD-10-CM | POA: Diagnosis not present

## 2019-04-09 DIAGNOSIS — M48061 Spinal stenosis, lumbar region without neurogenic claudication: Secondary | ICD-10-CM | POA: Diagnosis not present

## 2019-04-09 DIAGNOSIS — M5380 Other specified dorsopathies, site unspecified: Secondary | ICD-10-CM | POA: Diagnosis not present

## 2019-04-09 DIAGNOSIS — Z7901 Long term (current) use of anticoagulants: Secondary | ICD-10-CM | POA: Diagnosis not present

## 2019-04-09 DIAGNOSIS — M4726 Other spondylosis with radiculopathy, lumbar region: Secondary | ICD-10-CM | POA: Diagnosis not present

## 2019-04-09 DIAGNOSIS — M5416 Radiculopathy, lumbar region: Secondary | ICD-10-CM | POA: Diagnosis not present

## 2019-04-09 DIAGNOSIS — M5126 Other intervertebral disc displacement, lumbar region: Secondary | ICD-10-CM | POA: Diagnosis not present

## 2019-04-09 MED ORDER — METHOTREXATE SODIUM 2.5 MG PO TABS: ORAL_TABLET | ORAL | 0 refills | Status: DC

## 2019-04-09 NOTE — Telephone Encounter (Signed)
Last Visit: 02/12/19 Next Visit: 09/30/19 Labs: 01/11/19 Glucose is elevated  Okay to refill per Dr. Estanislado Pandy

## 2019-04-28 NOTE — Progress Notes (Signed)
Cardiology Office Note   Date:  04/30/2019   ID:  JANUS RAAD, DOB 06/20/1932, MRN [id number]  PCP:  [name], MD    No chief complaint on file.  CAD  Wt Readings from Last 3 Encounters:  04/30/19 169 lb 1.9 oz (76.7 kg)  02/12/19 172 lb (78 kg)  02/12/19 172 lb (78 kg)       History of Present Illness: Sophia Ward is a 85 y.o. female  With h/o CAD. She had an LAD stent placed in 2004 (drug-eluting stent for restenosis) and has done well since then.  Her PLavix was stopped due to anemiain the past. Plavix was restartedlaterafter anemia was stable.  She haschronic back problems and has received injections in the past.  She has been intolerant of metformin in the pastdue to diarrhea.  In [name] 2018, she was in the hospital in [place] and was diagnosed with PMR. She was later found to have rheumatoid arthritis. MTX was started. Prednisone is being tapered gradually.  She has chronic back pain treated with injections.  She travels to [place] in the winter time.   In May 2019, she had a PMR flare.  She is on chronic Prednisone.    No sx like what she had before her stent.  At that time, she had DOE with minimal exertion.    Back pain limits her exercise.  She does have access to a recumbent bike.  Walking in grocery store is her most strenuous activity.  No sx with that activity.    In the past, She has some numbness and pain in her legs not related to exertion.    Denies : Chest pain. Dizziness. Leg edema. Nitroglycerin use. Orthopnea. Palpitations. Paroxysmal nocturnal dyspnea. Shortness of breath. Syncope.   She does walk occasionally for exercise, 4x/week.  She also uses a recumbent bike. NO cardiac sx with those activities.   She had some eye surgery for a detached retina, general anesthesia- no cardiac issues.  She reports bruising on her arms- prednisone is being tapered.    Past Medical History:  Diagnosis Date  . Allergic  rhinitis   . Anemia   . Arthritis    hands  . BPPV (benign paroxysmal positional vertigo)   . Coronary artery disease   . Coronary atherosclerosis of native coronary artery 01/30/2014  . Diabetes mellitus without complication ([place])   . Essential hypertension, benign 01/30/2014  . GERD (gastroesophageal reflux disease)   . Hypertension   . Mixed hyperlipidemia 01/30/2014  . Myocardial infarction ([place]) 2004   mild MI-no damage- had stents  . Osteopenia   . Post-menopausal   . Stenosing tenosynovitis     Past Surgical History:  Procedure Laterality Date  . ABDOMINAL HYSTERECTOMY    . APPENDECTOMY    . CARDIAC CATHETERIZATION  2004  . carpel tunnel    . COLONOSCOPY WITH PROPOFOL N/A 10/30/2012   Procedure: COLONOSCOPY WITH PROPOFOL;  Surgeon: Garlan Fair, MD;  Location: [facility];  Service: Endoscopy;  Laterality: N/A;  . CORONARY ANGIOPLASTY  2004  . DILATION AND CURETTAGE OF UTERUS    . ESOPHAGOGASTRODUODENOSCOPY (EGD) WITH PROPOFOL N/A 10/30/2012   Procedure: ESOPHAGOGASTRODUODENOSCOPY (EGD) WITH PROPOFOL;  Surgeon: Garlan Fair, MD;  Location: [facility];  Service: Endoscopy;  Laterality: N/A;  . EYE SURGERY     bilateral cataract with lens implant  . EYE SURGERY    . INJECTION OF SILICONE OIL Left [id number]   Procedure: Injection Of Silicone  Oil;  Surgeon: Jalene Mullet, MD;  Location: [facility];  Service: Ophthalmology;  Laterality: Left;  . left shouler surgery    . PHOTOCOAGULATION WITH LASER Left 02/12/2019   Procedure: Photocoagulation With Laser;  Surgeon: Jalene Mullet, MD;  Location: [facility];  Service: Ophthalmology;  Laterality: Left;  . 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 02/12/2019   Procedure: REPAIR OF COMPLEX TRACTION RETINAL DETACHMENT;  Surgeon: Jalene Mullet, MD;  Location: [facility];  Service: Ophthalmology;  Laterality: Left;  . TONSILLECTOMY    . VITRECTOMY 25 GAUGE WITH SCLERAL BUCKLE Left 02/12/2019   Procedure: Vitrectomy 25 Gauge With  Scleral Buckle;  Surgeon: Jalene Mullet, MD;  Location: [facility];  Service: Ophthalmology;  Laterality: Left;     Current Outpatient Medications  Medication Sig Dispense Refill  . Acetaminophen (TYLENOL ARTHRITIS PAIN PO) Take by mouth as needed.    Marland Kitchen amLODipine (NORVASC) 5 MG tablet Take 10 mg by mouth every morning.     Marland Kitchen atorvastatin (LIPITOR) 40 MG tablet Take 40 mg by mouth at bedtime.    . Calcium Carbonate-Vitamin D (CALCIUM + D PO) Take 1 tablet by mouth every morning.    . Cholecalciferol (VITAMIN D3) 1000 UNITS CAPS Take 1,000 Units by mouth 2 (two) times daily.     . clopidogrel (PLAVIX) 75 MG tablet Take 75 mg by mouth daily.    . Coenzyme Q10 (COQ10) 200 MG CAPS Take by mouth daily.    Marland Kitchen desonide (DESOWEN) 0.05 % lotion Apply 1 application topically as needed.    . diclofenac sodium (VOLTAREN) 1 % GEL Apply 3 g to 3 large joints up to 3 times daily 3 Tube 3  . folic acid (FOLVITE) 1 MG tablet Take 2 tablets (2 mg total) by mouth daily. 180 tablet 2  . insulin lispro (HUMALOG KWIKPEN) 100 UNIT/ML KiwkPen Humalog KwikPen (U-100) Insulin 100 unit/mL subcutaneous  Injecting 7 at breakfast and 12 at lunch an dinner    . LEVEMIR FLEXTOUCH 100 UNIT/ML Pen 25 Units.   0  . methotrexate 2.5 MG tablet TAKE 5 TABLETS BY MOUTH ONCE A WEEK 20 tablet 0  . metoprolol succinate (TOPROL-XL) 100 MG 24 hr tablet Take 100 mg by mouth every morning.    . nitroGLYCERIN (NITROSTAT) 0.4 MG SL tablet DISSOLVE ONE TABLET UNDER THE TONGUE EVERY 5 MINUTES AS NEEDED FOR CHEST PAIN.  DO NOT EXCEED A TOTAL OF 3 DOSES IN 15 MINUTES 25 tablet 5  . Omega-3 Fatty Acids (FISH OIL PO) Take 1 capsule by mouth every morning.    . pantoprazole (PROTONIX) 40 MG tablet Take 40 mg by mouth daily after lunch.    . predniSONE (DELTASONE) 1 MG tablet Take 4 tablets (4 mg total) by mouth daily with breakfast. 360 tablet 0  . ramipril (ALTACE) 10 MG tablet Take 20 mg by mouth every morning.    . traMADol (ULTRAM) 50 MG tablet  Take by mouth every 6 (six) hours as needed.     No current facility-administered medications for this visit.     Allergies:   Codeine and Penicillins    Social History:  The patient  reports that she has never smoked. She has never used smokeless tobacco. She reports that she does not drink alcohol or use drugs.   Family History:  The patient's family history includes Arthritis in her daughter; CAD in her father and mother; Heart Problems in her brother; Heart attack in her mother.    ROS:  Please  see the history of present illness.   Otherwise, review of systems are positive for bruising.   All other systems are reviewed and negative.    PHYSICAL EXAM: VS:  BP (!) 108/56   Pulse 80   Ht 5' 2.5" (1.588 m)   Wt 169 lb 1.9 oz (76.7 kg)   SpO2 95%   BMI 30.44 kg/m  , BMI Body mass index is 30.44 kg/m. GEN: Well nourished, well developed, in no acute distress  HEENT: normal  Neck: no JVD, carotid bruits, or masses Cardiac: RRR; 2/6 late peaking systolic murmur, no rubs, or gallops,no edema  Respiratory:  clear to auscultation bilaterally, normal work of breathing GI: soft, nontender, nondistended, + BS MS: no deformity or atrophy  Skin: warm and dry, no rash Neuro:  Strength and sensation are intact Psych: euthymic mood, full affect   EKG:   The ekg ordered today demonstrates NSR, no ST changes   Recent Labs: 01/11/2019: ALT 14 02/12/2019: BUN 15; Creatinine, Ser 0.94; Hemoglobin 13.4; Platelets 437; Potassium 3.5; Sodium 139   Lipid Panel No results found for: CHOL, TRIG, HDL, CHOLHDL, VLDL, LDLCALC, LDLDIRECT   Other studies Reviewed: Additional studies/ records that were reviewed today with results demonstrating: 2019 echo: "Left ventricle: The cavity size was normal. Systolic function was   normal. The estimated ejection fraction was in the range of 60%   to 65%. Wall motion was normal; there were no regional wall   motion abnormalities. Features are consistent with  a pseudonormal   left ventricular filling pattern, with concomitant abnormal   relaxation and increased filling pressure (grade 2 diastolic   dysfunction). Doppler parameters are consistent with high   ventricular filling pressure. - Aortic valve: Valve mobility was restricted. There was moderate   stenosis. There was no regurgitation. Peak velocity (S): 340   cm/s. Mean gradient (S): 22 mm Hg. - Mitral valve: Calcified annulus. Transvalvular velocity was   within the normal range. There was no evidence for stenosis.   There was trivial regurgitation. - Left atrium: The atrium was moderately dilated. - Right ventricle: The cavity size was normal. Wall thickness was   normal. Systolic function was normal. - Right atrium: The atrium was mildly dilated. - Atrial septum: No defect or patent foramen ovale was identified   by color flow Doppler. - Tricuspid valve: There was mild regurgitation. - Pulmonary arteries: Systolic pressure was within the normal   range. PA peak pressure: 39 mm Hg (S). .   ASSESSMENT AND PLAN:  1. CAD: No angina. Continue aggressive secondary prevention. Continue Plavix monotherapy despite bruising.  No internal bleeding issues noted.  2. Hyperlipidemia: LDL 74 in 12/19.  Normal LFT. COntinue statin.  3. HTN: The current medical regimen is effective;  continue present plan and medications. 4. Moderate aortic stenosis: Murmur present.  No sx of severe AS.    Current medicines are reviewed at length with the patient today.  The patient concerns regarding her medicines were addressed.  The following changes have been made:  No change  Labs/ tests ordered today include:  No orders of the defined types were placed in this encounter.   Recommend 150 minutes/week of aerobic exercise Low fat, low carb, high fiber diet recommended  Disposition:   FU in 1 year   Signed, Larae Grooms, MD  04/30/2019 9:14 AM    [facility] [address], [place], [place]  [address] Phone: [phone]; Fax: [phone]

## 2019-04-30 ENCOUNTER — Other Ambulatory Visit: Payer: Self-pay

## 2019-04-30 ENCOUNTER — Ambulatory Visit (INDEPENDENT_AMBULATORY_CARE_PROVIDER_SITE_OTHER): Payer: Medicare Other | Admitting: Interventional Cardiology

## 2019-04-30 ENCOUNTER — Encounter: Payer: Self-pay | Admitting: Interventional Cardiology

## 2019-04-30 VITALS — BP 108/56 | HR 80 | Ht 62.5 in | Wt 169.1 lb

## 2019-04-30 DIAGNOSIS — I25119 Atherosclerotic heart disease of native coronary artery with unspecified angina pectoris: Secondary | ICD-10-CM

## 2019-04-30 DIAGNOSIS — I10 Essential (primary) hypertension: Secondary | ICD-10-CM | POA: Diagnosis not present

## 2019-04-30 DIAGNOSIS — E782 Mixed hyperlipidemia: Secondary | ICD-10-CM | POA: Diagnosis not present

## 2019-04-30 DIAGNOSIS — R011 Cardiac murmur, unspecified: Secondary | ICD-10-CM | POA: Diagnosis not present

## 2019-04-30 MED ORDER — ATORVASTATIN CALCIUM 40 MG PO TABS: 40.0000 mg | ORAL_TABLET | Freq: Every day | ORAL | 3 refills | Status: AC

## 2019-04-30 MED ORDER — AMLODIPINE BESYLATE 5 MG PO TABS: 10.0000 mg | ORAL_TABLET | Freq: Every morning | ORAL | 3 refills | Status: DC

## 2019-04-30 MED ORDER — METOPROLOL SUCCINATE ER 100 MG PO TB24: 100.0000 mg | ORAL_TABLET | Freq: Every morning | ORAL | 3 refills | Status: DC

## 2019-04-30 MED ORDER — NITROGLYCERIN 0.4 MG SL SUBL: SUBLINGUAL_TABLET | SUBLINGUAL | 5 refills | Status: DC

## 2019-04-30 NOTE — Patient Instructions (Signed)

## 2019-05-01 ENCOUNTER — Other Ambulatory Visit: Payer: Self-pay | Admitting: Rheumatology

## 2019-05-01 DIAGNOSIS — L821 Other seborrheic keratosis: Secondary | ICD-10-CM | POA: Diagnosis not present

## 2019-05-01 DIAGNOSIS — D692 Other nonthrombocytopenic purpura: Secondary | ICD-10-CM | POA: Diagnosis not present

## 2019-05-01 DIAGNOSIS — D485 Neoplasm of uncertain behavior of skin: Secondary | ICD-10-CM | POA: Diagnosis not present

## 2019-05-01 DIAGNOSIS — L570 Actinic keratosis: Secondary | ICD-10-CM | POA: Diagnosis not present

## 2019-05-01 DIAGNOSIS — Z85828 Personal history of other malignant neoplasm of skin: Secondary | ICD-10-CM | POA: Diagnosis not present

## 2019-05-02 NOTE — Telephone Encounter (Signed)
Last Visit: 02/12/2019 Next Visit: 09/30/2019 Labs: 04/04/2019 elevated glucose, WBC 11.1, MCHC 31.2, abs monocytes 1166.  Okay to refill per Dr. Estanislado Pandy.

## 2019-05-03 DIAGNOSIS — H31092 Other chorioretinal scars, left eye: Secondary | ICD-10-CM | POA: Diagnosis not present

## 2019-05-03 DIAGNOSIS — H338 Other retinal detachments: Secondary | ICD-10-CM | POA: Diagnosis not present

## 2019-05-03 DIAGNOSIS — H33322 Round hole, left eye: Secondary | ICD-10-CM | POA: Diagnosis not present

## 2019-05-03 DIAGNOSIS — H35372 Puckering of macula, left eye: Secondary | ICD-10-CM | POA: Diagnosis not present

## 2019-05-06 NOTE — Telephone Encounter (Signed)
Received fax from [facility] that states MFG that patient has been on previously is unavailable. Called pharmacy and okayed change in MFG that is available.

## 2019-05-08 DIAGNOSIS — H33322 Round hole, left eye: Secondary | ICD-10-CM | POA: Diagnosis not present

## 2019-05-13 ENCOUNTER — Other Ambulatory Visit: Payer: Self-pay | Admitting: Rheumatology

## 2019-05-13 ENCOUNTER — Telehealth: Payer: Self-pay | Admitting: Rheumatology

## 2019-05-13 NOTE — Telephone Encounter (Signed)
Prednisone was sent to the pharmacy on 05/02/2019 which is why the request was denied today. I called the pharmacy and they received the prednisone prescription on 05/02/2019 but patient did not pick it up. They will have it ready for her tomorrow. I advised patient and she verbalized understanding.

## 2019-05-13 NOTE — Telephone Encounter (Signed)
Patient left a voicemail requesting prescription refill of Prednisone to be sent to [facility] in [facility].  Patient states when she called [facility] she was told Dr. Estanislado Pandy denied her renewal.

## 2019-05-28 ENCOUNTER — Telehealth: Payer: Self-pay | Admitting: Rheumatology

## 2019-05-28 NOTE — Telephone Encounter (Signed)
Patient left a voicemail stating she will be leaving for [place] next week.  Patient states she plans to have her labwork in [place] and would like to stop by the office this morning to pick up her labwork orders.

## 2019-05-28 NOTE — Telephone Encounter (Signed)
Lab order prescription is at the front desk ready for pick up.

## 2019-06-04 DIAGNOSIS — H3342 Traction detachment of retina, left eye: Secondary | ICD-10-CM | POA: Diagnosis not present

## 2019-06-04 DIAGNOSIS — T85398A Other mechanical complication of other ocular prosthetic devices, implants and grafts, initial encounter: Secondary | ICD-10-CM | POA: Diagnosis not present

## 2019-06-04 DIAGNOSIS — H338 Other retinal detachments: Secondary | ICD-10-CM | POA: Diagnosis not present

## 2019-06-11 ENCOUNTER — Ambulatory Visit: Payer: Medicare Other | Attending: Internal Medicine

## 2019-06-11 DIAGNOSIS — Z23 Encounter for immunization: Secondary | ICD-10-CM | POA: Insufficient documentation

## 2019-06-11 NOTE — Progress Notes (Signed)
   Covid-19 Vaccination Clinic  Name:  Sophia Ward    MRN: [id number] DOB: [birth year]  06/11/2019  [name] was observed post Covid-19 immunization for 30 minutes based on pre-vaccination screening without incidence. She was provided with Vaccine Information Sheet and instruction to access the V-Safe system.   [name] was instructed to call 911 with any severe reactions post vaccine: Marland Kitchen Difficulty breathing  . Swelling of your face and throat  . A fast heartbeat  . A bad rash all over your body  . Dizziness and weakness    Immunizations Administered    Name Date Dose VIS Date Route   Pfizer COVID-19 Vaccine 06/11/2019  9:32 AM 0.3 mL 05/10/2019 Intramuscular   Manufacturer: [place]   Lot: [id number]   [place]: [id number]

## 2019-06-18 DIAGNOSIS — H3522 Other non-diabetic proliferative retinopathy, left eye: Secondary | ICD-10-CM | POA: Diagnosis not present

## 2019-06-18 DIAGNOSIS — H4312 Vitreous hemorrhage, left eye: Secondary | ICD-10-CM | POA: Diagnosis not present

## 2019-06-18 DIAGNOSIS — H35371 Puckering of macula, right eye: Secondary | ICD-10-CM | POA: Diagnosis not present

## 2019-06-28 DIAGNOSIS — Z961 Presence of intraocular lens: Secondary | ICD-10-CM | POA: Diagnosis not present

## 2019-06-28 DIAGNOSIS — H35352 Cystoid macular degeneration, left eye: Secondary | ICD-10-CM | POA: Diagnosis not present

## 2019-06-28 DIAGNOSIS — H59812 Chorioretinal scars after surgery for detachment, left eye: Secondary | ICD-10-CM | POA: Diagnosis not present

## 2019-06-28 DIAGNOSIS — H35371 Puckering of macula, right eye: Secondary | ICD-10-CM | POA: Diagnosis not present

## 2019-07-01 ENCOUNTER — Ambulatory Visit: Payer: Medicare Other | Attending: Internal Medicine

## 2019-07-01 DIAGNOSIS — Z23 Encounter for immunization: Secondary | ICD-10-CM

## 2019-07-01 NOTE — Progress Notes (Signed)
   Covid-19 Vaccination Clinic  Name:  Sophia Ward    MRN: [id number] DOB: 01/04/1933  07/01/2019  [name] was observed post Covid-19 immunization for 15 minutes without incidence. She was provided with Vaccine Information Sheet and instruction to access the V-Safe system.   [name] was instructed to call 911 with any severe reactions post vaccine: Marland Kitchen Difficulty breathing  . Swelling of your face and throat  . A fast heartbeat  . A bad rash all over your body  . Dizziness and weakness    Immunizations Administered    Name Date Dose VIS Date Route   Pfizer COVID-19 Vaccine 07/01/2019  9:58 AM 0.3 mL 05/10/2019 Intramuscular   Manufacturer: [place]   Lot: [id number]   [place]: [id number]

## 2019-07-09 DIAGNOSIS — R300 Dysuria: Secondary | ICD-10-CM | POA: Diagnosis not present

## 2019-07-10 DIAGNOSIS — R300 Dysuria: Secondary | ICD-10-CM | POA: Diagnosis not present

## 2019-07-10 DIAGNOSIS — N390 Urinary tract infection, site not specified: Secondary | ICD-10-CM | POA: Diagnosis not present

## 2019-07-11 ENCOUNTER — Encounter: Payer: Self-pay | Admitting: Rheumatology

## 2019-07-11 DIAGNOSIS — Z79899 Other long term (current) drug therapy: Secondary | ICD-10-CM | POA: Diagnosis not present

## 2019-07-12 ENCOUNTER — Telehealth: Payer: Self-pay

## 2019-07-12 MED ORDER — METHOTREXATE SODIUM 2.5 MG PO TABS: ORAL_TABLET | ORAL | 0 refills | Status: DC

## 2019-07-12 MED ORDER — FOLIC ACID 1 MG PO TABS: 2.0000 mg | ORAL_TABLET | Freq: Every day | ORAL | 2 refills | Status: DC

## 2019-07-12 NOTE — Telephone Encounter (Signed)
Lab results received via fax from [facility] in [place] (ordred by Dr. Estanislado Pandy).  07/11/2019  CBC & CMP Creat 0.97, GFR 54, WBC 11.2, RBC 3.79, hemoglobin 10.6, hematocrit 32.8, absolute monocytes 1434.   Reviewed by Hazel Sams, PA-C and she indicated "stable". Will send to the scan center.   Advised patient of lab results and patient verbalized understanding. Patient requested a refill of MTX and folic acid to be sent to [facility] in [place], [place].

## 2019-07-15 DIAGNOSIS — M5380 Other specified dorsopathies, site unspecified: Secondary | ICD-10-CM | POA: Diagnosis not present

## 2019-07-15 DIAGNOSIS — M48061 Spinal stenosis, lumbar region without neurogenic claudication: Secondary | ICD-10-CM | POA: Diagnosis not present

## 2019-07-15 DIAGNOSIS — M5126 Other intervertebral disc displacement, lumbar region: Secondary | ICD-10-CM | POA: Diagnosis not present

## 2019-07-15 DIAGNOSIS — M545 Low back pain: Secondary | ICD-10-CM | POA: Diagnosis not present

## 2019-07-15 DIAGNOSIS — M4726 Other spondylosis with radiculopathy, lumbar region: Secondary | ICD-10-CM | POA: Diagnosis not present

## 2019-07-15 DIAGNOSIS — Z7901 Long term (current) use of anticoagulants: Secondary | ICD-10-CM | POA: Diagnosis not present

## 2019-07-15 DIAGNOSIS — M5416 Radiculopathy, lumbar region: Secondary | ICD-10-CM | POA: Diagnosis not present

## 2019-07-15 DIAGNOSIS — M5136 Other intervertebral disc degeneration, lumbar region: Secondary | ICD-10-CM | POA: Diagnosis not present

## 2019-07-18 DIAGNOSIS — N390 Urinary tract infection, site not specified: Secondary | ICD-10-CM | POA: Diagnosis not present

## 2019-08-13 DIAGNOSIS — B9620 Unspecified Escherichia coli [E. coli] as the cause of diseases classified elsewhere: Secondary | ICD-10-CM | POA: Diagnosis not present

## 2019-08-13 DIAGNOSIS — N390 Urinary tract infection, site not specified: Secondary | ICD-10-CM | POA: Diagnosis not present

## 2019-08-19 ENCOUNTER — Telehealth: Payer: Self-pay | Admitting: Rheumatology

## 2019-08-19 MED ORDER — PREDNISONE 1 MG PO TABS: 4.0000 mg | ORAL_TABLET | Freq: Every day | ORAL | 0 refills | Status: DC

## 2019-08-19 NOTE — Telephone Encounter (Signed)
Patient called requesting prescription refill of Prednisone 1 mg to be sent to [facility] at [address] in [place], [place].  Their phone [phone]

## 2019-08-19 NOTE — Telephone Encounter (Signed)
Last Visit: 02/12/2019 Next Visit: 09/30/2019  Spoke with patient and verified with patient she is on Prednisone 4 mg daily.   Okay to refill per Dr. Estanislado Pandy

## 2019-08-21 DIAGNOSIS — N390 Urinary tract infection, site not specified: Secondary | ICD-10-CM | POA: Diagnosis not present

## 2019-09-13 DIAGNOSIS — R300 Dysuria: Secondary | ICD-10-CM | POA: Diagnosis not present

## 2019-09-13 DIAGNOSIS — N390 Urinary tract infection, site not specified: Secondary | ICD-10-CM | POA: Diagnosis not present

## 2019-09-16 ENCOUNTER — Telehealth: Payer: Self-pay | Admitting: *Deleted

## 2019-09-16 DIAGNOSIS — M5416 Radiculopathy, lumbar region: Secondary | ICD-10-CM | POA: Diagnosis not present

## 2019-09-16 DIAGNOSIS — M5126 Other intervertebral disc displacement, lumbar region: Secondary | ICD-10-CM | POA: Diagnosis not present

## 2019-09-16 DIAGNOSIS — M48061 Spinal stenosis, lumbar region without neurogenic claudication: Secondary | ICD-10-CM | POA: Diagnosis not present

## 2019-09-16 DIAGNOSIS — M5380 Other specified dorsopathies, site unspecified: Secondary | ICD-10-CM | POA: Diagnosis not present

## 2019-09-16 DIAGNOSIS — M5136 Other intervertebral disc degeneration, lumbar region: Secondary | ICD-10-CM | POA: Diagnosis not present

## 2019-09-16 DIAGNOSIS — Z7901 Long term (current) use of anticoagulants: Secondary | ICD-10-CM | POA: Diagnosis not present

## 2019-09-16 DIAGNOSIS — M545 Low back pain: Secondary | ICD-10-CM | POA: Diagnosis not present

## 2019-09-16 DIAGNOSIS — M4726 Other spondylosis with radiculopathy, lumbar region: Secondary | ICD-10-CM | POA: Diagnosis not present

## 2019-09-16 NOTE — Telephone Encounter (Signed)
   Primary Cardiologist: Larae Grooms, MD  Chart reviewed as part of pre-operative protocol coverage.  Hx of CAD s/p LAD stent placed in 2004 (drug-eluting stent for restenosis). Remained on monotherapy with Plavix.   Dr. Irish Lack, can patient hold Plavix and how long? Please forward your response to P [facility] PREOP.   Thank you   Leanor Kail, PA 09/16/2019, 4:06 PM

## 2019-09-16 NOTE — Telephone Encounter (Signed)
   [facility] Pre-operative Risk Assessment    Request for surgical clearance:  1. What type of surgery is being performed? INTERVENTIONAL INJECTIONS FOR PAIN MANAGEMENT; SPINAL INJECTIONS   2. When is this surgery scheduled? TBD   3. What type of clearance is required (medical clearance vs. Pharmacy clearance to hold med vs. Both)? MEDICAL  4. Are there any medications that need to be held prior to surgery and how long? PLAVIX x 7 DAYS PRIOR TO INJECTION   5. Practice name and name of physician performing surgery? [facility]; DR. Angela Adam   6. What is your office phone number [phone]   7.   What is your office fax number [phone]  8.   Anesthesia type (None, local, MAC, general) ? NONE LISTED    [name] 09/16/2019, 11:44 AM  _________________________________________________________________   (provider comments below)

## 2019-09-16 NOTE — Telephone Encounter (Signed)
Ok to hold Plavix 5 days prior to procedure.  JV

## 2019-09-17 NOTE — Telephone Encounter (Signed)
   Primary Cardiologist: Larae Grooms, MD  Chart reviewed as part of pre-operative protocol coverage. Given past medical history and time since last visit, based on ACC/AHA guidelines, ALISSE TWARDZIK would be at acceptable risk for the planned procedure without further cardiovascular testing.   She may hold her Plavix 5 days prior to the procedure and restart as soon as hemostasis is achieved.  I will route this recommendation to the requesting party via Epic fax function and remove from pre-op pool.  Please call with questions.  Jossie Ng. [facility] [address] Office [phone] Fax [phone]

## 2019-09-25 NOTE — Progress Notes (Signed)
Office Visit Note  Patient: Sophia Ward             Date of Birth: 13-Jul-1932           MRN: [id number]             PCP: [name], MD Referring: [name], MD Visit Date: 09/30/2019 Occupation: @GUAROCC @  Subjective:  Medication monitoring   History of Present Illness: Sophia Ward is a 86 y.o. female with history of seropositive rheumatoid arthritis and osteoarthritis.  She is taking MTX 5 tablets po once weekly and folic acid 2 mg po daily.  She is on long term prednisone 4 mg po daily.  She denies any recent rheumatoid arthritis flares.  She is occasional stiffness in both hands but performs hand exercises which relieves her discomfort.  She has not needed to use Voltaren gel recently.  She states that yesterday she was experiencing significant discomfort due to right trochanter bursitis but massage has relieved her symptoms.  She denies any signs or symptoms of a PMR flare.  She has been walking for exercise.  She has not had any difficulty getting up from a chair or raising her arms above her head recently. She states that she had an updated DEXA in December 2019.  She is taking calcium and vitamin D supplements daily. According to the patient she has been experiencing recurrent UTIs.  She has been followed by ID.   Activities of Daily Living:  Patient reports morning stiffness for 5 hours.   Patient Denies nocturnal pain.  Difficulty dressing/grooming: Denies Difficulty climbing stairs: Reports Difficulty getting out of chair: Reports Difficulty using hands for taps, buttons, cutlery, and/or writing: Denies  Review of Systems  Constitutional: Positive for fatigue.  HENT: Positive for mouth dryness. Negative for mouth sores and nose dryness.   Eyes: Positive for dryness. Negative for pain and visual disturbance.  Respiratory: Positive for shortness of breath. Negative for cough, hemoptysis and difficulty breathing.   Cardiovascular: Positive for swelling  in legs/feet. Negative for chest pain, palpitations and hypertension.  Gastrointestinal: Positive for diarrhea. Negative for blood in stool and constipation.  Musculoskeletal: Positive for arthralgias, joint pain, muscle weakness and morning stiffness. Negative for joint swelling, myalgias, muscle tenderness and myalgias.  Skin: Negative for color change, pallor, rash, hair loss, nodules/bumps, skin tightness, ulcers and sensitivity to sunlight.  Neurological: Negative for dizziness, numbness and headaches.  Hematological: Negative for swollen glands.  Psychiatric/Behavioral: Positive for sleep disturbance. Negative for depressed mood. The patient is not nervous/anxious.     PMFS History:  Patient Active Problem List   Diagnosis Date Noted  . Polymyalgia rheumatica ([place]) 11/25/2016  . Primary osteoarthritis of both hands 11/25/2016  . Bilateral primary osteoarthritis of knee 11/25/2016  . Osteopenia of multiple sites 11/25/2016  . 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 Osteoarthritis of lumbar spine 10/28/2016  . History of diabetes mellitus 10/27/2016  . History of gastroesophageal reflux (GERD) 10/27/2016  . Rheumatoid arthritis involving multiple sites with positive rheumatoid factor ([place]) 10/14/2016  . Elevated C-reactive protein (CRP) 10/14/2016  . Elevated sed rate 10/14/2016  . High risk medication use 10/14/2016  . Coronary atherosclerosis of native coronary artery 01/30/2014  . Mixed hyperlipidemia 01/30/2014  . Essential hypertension, benign 01/30/2014  . Obesity, unspecified 01/30/2014    Past Medical History:  Diagnosis Date  . Allergic rhinitis   . Anemia   . Arthritis    hands  . BPPV (benign paroxysmal  positional vertigo)   . Coronary artery disease   . Coronary atherosclerosis of native coronary artery 01/30/2014  . Diabetes mellitus without complication ([place])   . Essential hypertension, benign 01/30/2014  . GERD (gastroesophageal reflux disease)     . Hypertension   . Mixed hyperlipidemia 01/30/2014  . Myocardial infarction ([place]) 2004   mild MI-no damage- had stents  . Osteopenia   . Post-menopausal   . Rheumatoid arthritis ([place])   . Spinal stenosis   . Stenosing tenosynovitis     Family History  Problem Relation Age of Onset  . CAD Mother   . Heart attack Mother   . CAD Father   . Heart Problems Brother        open heart surgery   . Arthritis Daughter        psoriatic arthritis    Past Surgical History:  Procedure Laterality Date  . ABDOMINAL HYSTERECTOMY    . APPENDECTOMY    . CARDIAC CATHETERIZATION  2004  . carpel tunnel    . COLONOSCOPY WITH PROPOFOL N/A 10/30/2012   Procedure: COLONOSCOPY WITH PROPOFOL;  Surgeon: Garlan Fair, MD;  Location: [facility];  Service: Endoscopy;  Laterality: N/A;  . CORONARY ANGIOPLASTY  2004  . DILATION AND CURETTAGE OF UTERUS    . ESOPHAGOGASTRODUODENOSCOPY (EGD) WITH PROPOFOL N/A 10/30/2012   Procedure: ESOPHAGOGASTRODUODENOSCOPY (EGD) WITH PROPOFOL;  Surgeon: Garlan Fair, MD;  Location: [facility];  Service: Endoscopy;  Laterality: N/A;  . EYE SURGERY     bilateral cataract with lens implant  . EYE SURGERY    . INJECTION OF SILICONE OIL Left [id number]   Procedure: Injection Of Silicone Oil;  Surgeon: Jalene Mullet, MD;  Location: [facility];  Service: Ophthalmology;  Laterality: Left;  . left shouler surgery    . PHOTOCOAGULATION WITH LASER Left 02/12/2019   Procedure: Photocoagulation With Laser;  Surgeon: Jalene Mullet, MD;  Location: [facility];  Service: Ophthalmology;  Laterality: Left;  . 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 02/12/2019   Procedure: REPAIR OF COMPLEX TRACTION RETINAL DETACHMENT;  Surgeon: Jalene Mullet, MD;  Location: [facility];  Service: Ophthalmology;  Laterality: Left;  . TONSILLECTOMY    . VITRECTOMY 25 GAUGE WITH SCLERAL BUCKLE Left 02/12/2019   Procedure: Vitrectomy 25 Gauge With Scleral Buckle;  Surgeon: Jalene Mullet, MD;  Location: [facility];  Service: Ophthalmology;  Laterality: Left;   Social History   Social History Narrative  . Not on file   Immunization History  Administered Date(s) Administered  . Influenza, High Dose Seasonal PF 03/09/2014, 04/09/2015, 03/16/2016, 02/25/2017, 03/20/2018  . PFIZER SARS-COV-2 Vaccination 06/11/2019, 07/01/2019  . Zoster Recombinat (Shingrix) 02/07/2018, 06/04/2018     Objective: Vital Signs: BP 136/60 (BP Location: Left Arm, Patient Position: Sitting, Cuff Size: Normal)   Pulse 71   Resp 18   Ht 5' 2.5" (1.588 m)   Wt 171 lb 6.4 oz (77.7 kg)   BMI 30.85 kg/m    Physical Exam Vitals and nursing note reviewed.  Constitutional:      Appearance: She is well-developed.  HENT:     Head: Normocephalic and atraumatic.  Eyes:     Conjunctiva/sclera: Conjunctivae normal.  Pulmonary:     Effort: Pulmonary effort is normal.  Abdominal:     General: Bowel sounds are normal.     Palpations: Abdomen is soft.  Musculoskeletal:     Cervical back: Normal range of motion.  Lymphadenopathy:     Cervical: No cervical adenopathy.  Skin:  General: Skin is warm and dry.     Capillary Refill: Capillary refill takes less than 2 seconds.  Neurological:     Mental Status: She is alert and oriented to person, place, and time.  Psychiatric:        Behavior: Behavior normal.      Musculoskeletal Exam: C-spine good range of motion.  Thoracic kyphosis noted.  Lumbar spine has painful range of motion.  Shoulder joints have good range of motion with no discomfort.  Elbow joints have good range of motion no tenderness or inflammation.  Wrist joints have good range of motion with no synovitis.  She has PIP and DIP thickening consistent with osteoarthritis of both hands.  CMC joint thickening bilaterally.  Hip joints have good range of motion with no discomfort.  She is tenderness over the right trochanteric bursa.  Knee joints have good range of motion no warmth or effusion.  Ankle joints have  good range of motion no tenderness or inflammation.  No tenderness of the MTP joints.  CDAI Exam: CDAI Score: 1  Patient Global: 5 mm; Provider Global: 5 mm Swollen: 0 ; Tender: 0  Joint Exam 09/30/2019   No joint exam has been documented for this visit   There is currently no information documented on the homunculus. Go to the Rheumatology activity and complete the homunculus joint exam.  Investigation: No additional findings.  Imaging: No results found.  Recent Labs: Lab Results  Component Value Date   WBC 16.9 (H) 02/12/2019   HGB 13.4 02/12/2019   PLT 437 (H) 02/12/2019   NA 139 02/12/2019   K 3.5 02/12/2019   CL 102 02/12/2019   CO2 22 02/12/2019   GLUCOSE 152 (H) 02/12/2019   BUN 15 02/12/2019   CREATININE 0.94 02/12/2019   BILITOT 0.6 01/11/2019   ALKPHOS 47 12/17/2018   AST 13 01/11/2019   ALT 14 01/11/2019   PROT 6.9 01/11/2019   ALBUMIN 3.6 12/17/2018   CALCIUM 9.4 02/12/2019   GFRAA >60 02/12/2019    Speciality Comments: No specialty comments available.  Procedures:  No procedures performed Allergies: Codeine and Penicillins   Assessment / Plan:     Visit Diagnoses: Rheumatoid arthritis involving multiple sites with positive rheumatoid factor (HCC) - RF 309, ANA negative, CRP 5.8, synovitis, treated by Dr. Philbert Riser in [place] during the winter months: She has no synovitis or tenderness on exam.  She is not had any recent rheumatoid arthritis flares.  She is clinically doing well on methotrexate 5 tablets by mouth once weekly and folic acid 2 mg by mouth daily.  She is on long-term prednisone 4 mg daily.  She experiences occasional arthralgias but has not noticed any joint inflammation.  She will continue on the current treatment regimen.  A refill of methotrexate and folic acid were sent to the pharmacy today.  Updated lab work was obtained.  She was advised to notify us if she develops increased joint pain or joint swelling.  She will follow-up in the  office in 4 months.  High risk medication use - Methotrexate 2.5 mg 5 tablet every 7 days, folic acid 1 mg 2 tablets daily, and prednisone 4 mg po daily.  CBC and CMP were stable on 07/11/2019.  We will update lab work today.  Orders were released.  She will be due to update lab work in August and every 3 months to monitor for drug toxicity.  She has received both COVID-19 vaccinations.- Plan: COMPLETE METABOLIC PANEL WITH  GFR, CBC with Differential/Platelet  Polymyalgia rheumatica ([place]) - She has not had any signs or symptoms of a PMR flare.  She has no muscle weakness or muscle tenderness on exam today.  She is on long-term prednisone 4 mg by mouth daily and methotrexate 5 tablets by mouth once weekly.  She will continue on the current treatment regimen.  She was advised to notify us if she develops signs or symptoms of a flare.   Primary osteoarthritis of both hands: She has PIP and DIP thickening consistent with osteoarthritis of both hands.  She has subluxation of the right first DIP joint.  We discussed the importance of joint protection and muscle strengthening.  We discussed the importance of hand exercises.  She was given a handout of hand exercises to perform.  She uses Voltaren gel topically as needed for pain relief.  Primary osteoarthritis of both knees: She is not experiencing any discomfort in her knee joints at this time.  She has good range of motion of both knees with no discomfort.  No warmth or effusion was noted.  She has been walking for exercise.  Primary osteoarthritis of both feet: She has osteoarthritic changes in both feet.  She has no discomfort in her feet at this time.  She wears proper fitting shoes.  Trochanteric bursitis, right hip: She has tenderness over the right trochanter bursa on exam.  She states pain significant discomfort yesterday which has been relieved by massage.  We discussed the importance of performing stretching exercises on a daily basis.  She was given a  handout of these exercises to perform.  Osteopenia of multiple sites: DEXA 05/15/18: The BMD measured at Femur Neck Left is 0.820 g/cm2 with a T-score of -1.6.  She is taking a calcium and vitamin D supplement.   Other medical conditions are listed as follows:   History of hypertension  History of diabetes mellitus  History of hyperlipidemia  History of gastroesophageal reflux (GERD)  Orders: Orders Placed This Encounter  Procedures  . COMPLETE METABOLIC PANEL WITH GFR  . CBC with Differential/Platelet   Meds ordered this encounter  Medications  . folic acid (FOLVITE) 1 MG tablet    Sig: Take 2 tablets (2 mg total) by mouth daily.    Dispense:  180 tablet    Refill:  2  . methotrexate 2.5 MG tablet    Sig: TAKE 5 TABLETS BY MOUTH ONCE A WEEK. Caution:Chemotherapy. Protect from light.    Dispense:  60 tablet    Refill:  0    Follow-Up Instructions: Return in about 4 months (around 01/31/2020) for Rheumatoid arthritis, Osteoarthritis.   Hazel Sams, PA-C  I examined and evaluated the patient with Hazel Sams PA.  Patient has been doing well on combination of low-dose methotrexate and low-dose prednisone.  She had no synovitis on examination.  She does have underlying osteoarthritis affecting multiple joints.  She will continue current regimen.  The plan of care was discussed as noted above.  Bo Merino, MD Note - This record has been created using Editor, commissioning.  Chart creation errors have been sought, but may not always  have been located. Such creation errors do not reflect on  the standard of medical care.

## 2019-09-28 DIAGNOSIS — D649 Anemia, unspecified: Secondary | ICD-10-CM

## 2019-09-28 HISTORY — DX: Anemia, unspecified: D64.9

## 2019-09-30 ENCOUNTER — Encounter: Payer: Self-pay | Admitting: Rheumatology

## 2019-09-30 ENCOUNTER — Other Ambulatory Visit: Payer: Self-pay

## 2019-09-30 ENCOUNTER — Ambulatory Visit (INDEPENDENT_AMBULATORY_CARE_PROVIDER_SITE_OTHER): Payer: Medicare Other | Admitting: Rheumatology

## 2019-09-30 VITALS — BP 136/60 | HR 71 | Resp 18 | Ht 62.5 in | Wt 171.4 lb

## 2019-09-30 DIAGNOSIS — M0579 Rheumatoid arthritis with rheumatoid factor of multiple sites without organ or systems involvement: Secondary | ICD-10-CM

## 2019-09-30 DIAGNOSIS — Z8679 Personal history of other diseases of the circulatory system: Secondary | ICD-10-CM

## 2019-09-30 DIAGNOSIS — Z8639 Personal history of other endocrine, nutritional and metabolic disease: Secondary | ICD-10-CM

## 2019-09-30 DIAGNOSIS — M170 Bilateral primary osteoarthritis of knee: Secondary | ICD-10-CM

## 2019-09-30 DIAGNOSIS — Z79899 Other long term (current) drug therapy: Secondary | ICD-10-CM

## 2019-09-30 DIAGNOSIS — M19071 Primary osteoarthritis, right ankle and foot: Secondary | ICD-10-CM

## 2019-09-30 DIAGNOSIS — M353 Polymyalgia rheumatica: Secondary | ICD-10-CM | POA: Diagnosis not present

## 2019-09-30 DIAGNOSIS — M19041 Primary osteoarthritis, right hand: Secondary | ICD-10-CM | POA: Diagnosis not present

## 2019-09-30 DIAGNOSIS — Z8719 Personal history of other diseases of the digestive system: Secondary | ICD-10-CM

## 2019-09-30 DIAGNOSIS — M19042 Primary osteoarthritis, left hand: Secondary | ICD-10-CM | POA: Diagnosis not present

## 2019-09-30 DIAGNOSIS — M8589 Other specified disorders of bone density and structure, multiple sites: Secondary | ICD-10-CM | POA: Diagnosis not present

## 2019-09-30 DIAGNOSIS — M7061 Trochanteric bursitis, right hip: Secondary | ICD-10-CM | POA: Diagnosis not present

## 2019-09-30 DIAGNOSIS — M19072 Primary osteoarthritis, left ankle and foot: Secondary | ICD-10-CM

## 2019-09-30 MED ORDER — METHOTREXATE SODIUM 2.5 MG PO TABS: ORAL_TABLET | ORAL | 0 refills | Status: DC

## 2019-09-30 MED ORDER — FOLIC ACID 1 MG PO TABS: 2.0000 mg | ORAL_TABLET | Freq: Every day | ORAL | 2 refills | Status: DC

## 2019-09-30 NOTE — Patient Instructions (Addendum)
Standing Labs We placed an order today for your standing lab work.    Please come back and get your standing labs in August and every 3 months   We have open lab daily Monday through Thursday from 8:30-12:30 PM and 1:30-4:30 PM and Friday from 8:30-12:30 PM and 1:30-4:00 PM at the office of Dr. Bo Merino.   You may experience shorter wait times on Monday and Friday afternoons. The office is located at [address], [place], [place], [place] [address] No appointment is necessary.   Labs are drawn by [facility].  You may receive a bill from [facility] for your lab work.  If you wish to have your labs drawn at another location, please call the office 24 hours in advance to send orders.  If you have any questions regarding directions or hours of operation,  please call [phone].   Just as a reminder please drink plenty of water prior to coming for your lab work. Thanks!    Hip Bursitis Rehab Ask your health care provider which exercises are safe for you. Do exercises exactly as told by your health care provider and adjust them as directed. It is normal to feel mild stretching, pulling, tightness, or discomfort as you do these exercises. Stop right away if you feel sudden pain or your pain gets worse. Do not begin these exercises until told by your health care provider. Stretching exercise This exercise warms up your muscles and joints and improves the movement and flexibility of your hip. This exercise also helps to relieve pain and stiffness. Iliotibial band stretch An iliotibial band is a strong band of muscle tissue that runs from the outer side of your hip to the outer side of your thigh and knee. 1. Lie on your side with your left / right leg in the top position. 2. Bend your left / right knee and grab your ankle. Stretch out your bottom arm to help you balance. 3. Slowly bring your knee back so your thigh is behind your body. 4. Slowly lower your knee toward the floor until  you feel a gentle stretch on the outside of your left / right thigh. If you do not feel a stretch and your knee will not fall farther, place the heel of your other foot on top of your knee and pull your knee down toward the floor with your foot. 5. Hold this position for __________ seconds. 6. Slowly return to the starting position. Repeat __________ times. Complete this exercise __________ times a day. Strengthening exercises These exercises build strength and endurance in your hip and pelvis. Endurance is the ability to use your muscles for a long time, even after they get tired. Bridge This exercise strengthens the muscles that move your thigh backward (hip extensors). 1. Lie on your back on a firm surface with your knees bent and your feet flat on the floor. 2. Tighten your buttocks muscles and lift your buttocks off the floor until your trunk is level with your thighs. ? Do not arch your back. ? You should feel the muscles working in your buttocks and the back of your thighs. If you do not feel these muscles, slide your feet 1-2 inches (2.5-5 cm) farther away from your buttocks. ? If this exercise is too easy, try doing it with your arms crossed over your chest. 3. Hold this position for __________ seconds. 4. Slowly lower your hips to the starting position. 5. Let your muscles relax completely after each repetition. Repeat __________  times. Complete this exercise __________ times a day. Squats This exercise strengthens the muscles in front of your thigh and knee (quadriceps). 1. Stand in front of a table, with your feet and knees pointing straight ahead. You may rest your hands on the table for balance but not for support. 2. Slowly bend your knees and lower your hips like you are going to sit in a chair. ? Keep your weight over your heels, not over your toes. ? Keep your lower legs upright so they are parallel with the table legs. ? Do not let your hips go lower than your knees. ? Do  not bend lower than told by your health care provider. ? If your hip pain increases, do not bend as low. 3. Hold the squat position for __________ seconds. 4. Slowly push with your legs to return to standing. Do not use your hands to pull yourself to standing. Repeat __________ times. Complete this exercise __________ times a day. Hip hike 1. Stand sideways on a bottom step. Stand on your left / right leg with your other foot unsupported next to the step. You can hold on to the railing or wall for balance if needed. 2. Keep your knees straight and your torso square. Then lift your left / right hip up toward the ceiling. 3. Hold this position for __________ seconds. 4. Slowly let your left / right hip lower toward the floor, past the starting position. Your foot should get closer to the floor. Do not lean or bend your knees. Repeat __________ times. Complete this exercise __________ times a day. Single leg stand 1. Without shoes, stand near a railing or in a doorway. You may hold on to the railing or door frame as needed for balance. 2. Squeeze your left / right buttock muscles, then lift up your other foot. ? Do not let your left / right hip push out to the side. ? It is helpful to stand in front of a mirror for this exercise so you can watch your hip. 3. Hold this position for __________ seconds. Repeat __________ times. Complete this exercise __________ times a day. This information is not intended to replace advice given to you by your health care provider. Make sure you discuss any questions you have with your health care provider. Document Revised: 09/10/2018 Document Reviewed: 09/10/2018 Elsevier Patient Education  [place].  Hand Exercises Hand exercises can be helpful for almost anyone. These exercises can strengthen the hands, improve flexibility and movement, and increase blood flow to the hands. These results can make work and daily tasks easier. Hand exercises can be  especially helpful for people who have joint pain from arthritis or have nerve damage from overuse (carpal tunnel syndrome). These exercises can also help people who have injured a hand. Exercises Most of these hand exercises are gentle stretching and motion exercises. It is usually safe to do them often throughout the day. Warming up your hands before exercise may help to reduce stiffness. You can do this with gentle massage or by placing your hands in warm water for 10-15 minutes. It is normal to feel some stretching, pulling, tightness, or mild discomfort as you begin new exercises. This will gradually improve. Stop an exercise right away if you feel sudden, severe pain or your pain gets worse. Ask your health care provider which exercises are best for you. Knuckle bend or "claw" fist 1. Stand or sit with your arm, hand, and all five fingers pointed straight up.  Make sure to keep your wrist straight during the exercise. 2. Gently bend your fingers down toward your palm until the tips of your fingers are touching the top of your palm. Keep your big knuckle straight and just bend the small knuckles in your fingers. 3. Hold this position for __________ seconds. 4. Straighten (extend) your fingers back to the starting position. Repeat this exercise 5-10 times with each hand. Full finger fist 1. Stand or sit with your arm, hand, and all five fingers pointed straight up. Make sure to keep your wrist straight during the exercise. 2. Gently bend your fingers into your palm until the tips of your fingers are touching the middle of your palm. 3. Hold this position for __________ seconds. 4. Extend your fingers back to the starting position, stretching every joint fully. Repeat this exercise 5-10 times with each hand. Straight fist 1. Stand or sit with your arm, hand, and all five fingers pointed straight up. Make sure to keep your wrist straight during the exercise. 2. Gently bend your fingers at the big  knuckle, where your fingers meet your hand, and the middle knuckle. Keep the knuckle at the tips of your fingers straight and try to touch the bottom of your palm. 3. Hold this position for __________ seconds. 4. Extend your fingers back to the starting position, stretching every joint fully. Repeat this exercise 5-10 times with each hand. Tabletop 1. Stand or sit with your arm, hand, and all five fingers pointed straight up. Make sure to keep your wrist straight during the exercise. 2. Gently bend your fingers at the big knuckle, where your fingers meet your hand, as far down as you can while keeping the small knuckles in your fingers straight. Think of forming a tabletop with your fingers. 3. Hold this position for __________ seconds. 4. Extend your fingers back to the starting position, stretching every joint fully. Repeat this exercise 5-10 times with each hand. Finger spread 1. Place your hand flat on a table with your palm facing down. Make sure your wrist stays straight as you do this exercise. 2. Spread your fingers and thumb apart from each other as far as you can until you feel a gentle stretch. Hold this position for __________ seconds. 3. Bring your fingers and thumb tight together again. Hold this position for __________ seconds. Repeat this exercise 5-10 times with each hand. Making circles 1. Stand or sit with your arm, hand, and all five fingers pointed straight up. Make sure to keep your wrist straight during the exercise. 2. Make a circle by touching the tip of your thumb to the tip of your index finger. 3. Hold for __________ seconds. Then open your hand wide. 4. Repeat this motion with your thumb and each finger on your hand. Repeat this exercise 5-10 times with each hand. Thumb motion 1. Sit with your forearm resting on a table and your wrist straight. Your thumb should be facing up toward the ceiling. Keep your fingers relaxed as you move your thumb. 2. Lift your thumb up  as high as you can toward the ceiling. Hold for __________ seconds. 3. Bend your thumb across your palm as far as you can, reaching the tip of your thumb for the small finger (pinkie) side of your palm. Hold for __________ seconds. Repeat this exercise 5-10 times with each hand. Grip strengthening  1. Hold a stress ball or other soft ball in the middle of your hand. 2. Slowly increase the pressure, squeezing  the ball as much as you can without causing pain. Think of bringing the tips of your fingers into the middle of your palm. All of your finger joints should bend when doing this exercise. 3. Hold your squeeze for __________ seconds, then relax. Repeat this exercise 5-10 times with each hand. Contact a health care provider if:  Your hand pain or discomfort gets much worse when you do an exercise.  Your hand pain or discomfort does not improve within 2 hours after you exercise. If you have any of these problems, stop doing these exercises right away. Do not do them again unless your health care provider says that you can. Get help right away if:  You develop sudden, severe hand pain or swelling. If this happens, stop doing these exercises right away. Do not do them again unless your health care provider says that you can. This information is not intended to replace advice given to you by your health care provider. Make sure you discuss any questions you have with your health care provider. Document Revised: 09/06/2018 Document Reviewed: 05/17/2018 Elsevier Patient Education  [employer].

## 2019-10-01 DIAGNOSIS — E782 Mixed hyperlipidemia: Secondary | ICD-10-CM | POA: Diagnosis not present

## 2019-10-01 DIAGNOSIS — Z136 Encounter for screening for cardiovascular disorders: Secondary | ICD-10-CM | POA: Diagnosis not present

## 2019-10-01 DIAGNOSIS — N390 Urinary tract infection, site not specified: Secondary | ICD-10-CM | POA: Diagnosis not present

## 2019-10-01 DIAGNOSIS — E1122 Type 2 diabetes mellitus with diabetic chronic kidney disease: Secondary | ICD-10-CM | POA: Diagnosis not present

## 2019-10-01 DIAGNOSIS — M353 Polymyalgia rheumatica: Secondary | ICD-10-CM | POA: Diagnosis not present

## 2019-10-01 DIAGNOSIS — M069 Rheumatoid arthritis, unspecified: Secondary | ICD-10-CM | POA: Diagnosis not present

## 2019-10-01 DIAGNOSIS — I252 Old myocardial infarction: Secondary | ICD-10-CM | POA: Diagnosis not present

## 2019-10-01 DIAGNOSIS — M109 Gout, unspecified: Secondary | ICD-10-CM | POA: Diagnosis not present

## 2019-10-01 DIAGNOSIS — Z1389 Encounter for screening for other disorder: Secondary | ICD-10-CM | POA: Diagnosis not present

## 2019-10-01 DIAGNOSIS — I10 Essential (primary) hypertension: Secondary | ICD-10-CM | POA: Diagnosis not present

## 2019-10-01 DIAGNOSIS — Z0000 Encounter for general adult medical examination without abnormal findings: Secondary | ICD-10-CM | POA: Diagnosis not present

## 2019-10-01 DIAGNOSIS — K219 Gastro-esophageal reflux disease without esophagitis: Secondary | ICD-10-CM | POA: Diagnosis not present

## 2019-10-01 DIAGNOSIS — I2510 Atherosclerotic heart disease of native coronary artery without angina pectoris: Secondary | ICD-10-CM | POA: Diagnosis not present

## 2019-10-01 LAB — CBC WITH DIFFERENTIAL/PLATELET
Absolute Monocytes: 1703 cells/uL — ABNORMAL HIGH (ref 200–950)
Basophils Absolute: 79 cells/uL (ref 0–200)
Basophils Relative: 0.6 %
Eosinophils Absolute: 224 cells/uL (ref 15–500)
Eosinophils Relative: 1.7 %
HCT: 27.1 % — ABNORMAL LOW (ref 35.0–45.0)
Hemoglobin: 7.9 g/dL — ABNORMAL LOW (ref 11.7–15.5)
Lymphs Abs: 1888 cells/uL (ref 850–3900)
MCH: 23.4 pg — ABNORMAL LOW (ref 27.0–33.0)
MCHC: 29.2 g/dL — ABNORMAL LOW (ref 32.0–36.0)
MCV: 80.2 fL (ref 80.0–100.0)
MPV: 9.9 fL (ref 7.5–12.5)
Monocytes Relative: 12.9 %
Neutro Abs: 9306 cells/uL — ABNORMAL HIGH (ref 1500–7800)
Neutrophils Relative %: 70.5 %
Platelets: 448 10*3/uL — ABNORMAL HIGH (ref 140–400)
RBC: 3.38 10*6/uL — ABNORMAL LOW (ref 3.80–5.10)
RDW: 17.5 % — ABNORMAL HIGH (ref 11.0–15.0)
Total Lymphocyte: 14.3 %
WBC: 13.2 10*3/uL — ABNORMAL HIGH (ref 3.8–10.8)

## 2019-10-01 LAB — COMPLETE METABOLIC PANEL WITH GFR
AG Ratio: 1.6 (calc) (ref 1.0–2.5)
ALT: 9 U/L (ref 6–29)
AST: 8 U/L — ABNORMAL LOW (ref 10–35)
Albumin: 3.9 g/dL (ref 3.6–5.1)
Alkaline phosphatase (APISO): 42 U/L (ref 37–153)
BUN/Creatinine Ratio: 18 (calc) (ref 6–22)
BUN: 17 mg/dL (ref 7–25)
CO2: 25 mmol/L (ref 20–32)
Calcium: 9.4 mg/dL (ref 8.6–10.4)
Chloride: 106 mmol/L (ref 98–110)
Creat: 0.95 mg/dL — ABNORMAL HIGH (ref 0.60–0.88)
GFR, Est African American: 64 mL/min/{1.73_m2} (ref >=60)
GFR, Est Non African American: 55 mL/min/{1.73_m2} — ABNORMAL LOW (ref >=60)
Globulin: 2.5 g/dL (calc) (ref 1.9–3.7)
Glucose, Bld: 193 mg/dL — ABNORMAL HIGH (ref 65–99)
Potassium: 4.1 mmol/L (ref 3.5–5.3)
Sodium: 141 mmol/L (ref 135–146)
Total Bilirubin: 0.4 mg/dL (ref 0.2–1.2)
Total Protein: 6.4 g/dL (ref 6.1–8.1)

## 2019-10-01 NOTE — Progress Notes (Signed)
Glucose is elevated-193.  Creatinine is borderline elevated-0.95.  GFR is low-55 but stable.  Please advise the patient to avoid NSAIDs.   Hgb and hct are extremely low.  Please notify patient and call PCPs office to report lab values.  She will require urgent further evaluation.   WBC count is elevated-13.2.  she is taking long term prednisone.  Plts are mildly elevated but stable.

## 2019-10-02 ENCOUNTER — Telehealth: Payer: Self-pay | Admitting: *Deleted

## 2019-10-02 DIAGNOSIS — H35371 Puckering of macula, right eye: Secondary | ICD-10-CM | POA: Diagnosis not present

## 2019-10-02 DIAGNOSIS — H35372 Puckering of macula, left eye: Secondary | ICD-10-CM | POA: Diagnosis not present

## 2019-10-02 DIAGNOSIS — H35352 Cystoid macular degeneration, left eye: Secondary | ICD-10-CM | POA: Diagnosis not present

## 2019-10-02 NOTE — Telephone Encounter (Signed)
Received results from [facility] at [facility].  Results reviewed by Hazel Sams, PA-C  Hgb A1C 7.3 WBC 11.1 RBC 3.65 Hgb 8.6 Hct 27.8 MCV 76.2 MCH 23.6 MCHC 31.0 RDW 20.3 Platelet 450 Glucose 133 GFR 59 MA/CR Ratio 54.0 UMA 4.75 Urine Microscopic: URWBC >20/HPF U EPI few U BAC few  Patient is on MTX 5 tabs weekly. Prednisone 4 mg daily.   Per Hazel Sams, PA-C Hgb 7.9 on 09/30/19. Called patient's PCP and patient was already in their office. Hgb improving on recheck.

## 2019-10-07 DIAGNOSIS — D1801 Hemangioma of skin and subcutaneous tissue: Secondary | ICD-10-CM | POA: Diagnosis not present

## 2019-10-07 DIAGNOSIS — L570 Actinic keratosis: Secondary | ICD-10-CM | POA: Diagnosis not present

## 2019-10-07 DIAGNOSIS — C44622 Squamous cell carcinoma of skin of right upper limb, including shoulder: Secondary | ICD-10-CM | POA: Diagnosis not present

## 2019-10-07 DIAGNOSIS — C44722 Squamous cell carcinoma of skin of right lower limb, including hip: Secondary | ICD-10-CM | POA: Diagnosis not present

## 2019-10-07 DIAGNOSIS — D485 Neoplasm of uncertain behavior of skin: Secondary | ICD-10-CM | POA: Diagnosis not present

## 2019-10-07 DIAGNOSIS — L812 Freckles: Secondary | ICD-10-CM | POA: Diagnosis not present

## 2019-10-07 DIAGNOSIS — Z85828 Personal history of other malignant neoplasm of skin: Secondary | ICD-10-CM | POA: Diagnosis not present

## 2019-10-07 DIAGNOSIS — L821 Other seborrheic keratosis: Secondary | ICD-10-CM | POA: Diagnosis not present

## 2019-10-14 DIAGNOSIS — I2510 Atherosclerotic heart disease of native coronary artery without angina pectoris: Secondary | ICD-10-CM | POA: Diagnosis not present

## 2019-10-14 DIAGNOSIS — E119 Type 2 diabetes mellitus without complications: Secondary | ICD-10-CM | POA: Diagnosis not present

## 2019-10-14 DIAGNOSIS — Z794 Long term (current) use of insulin: Secondary | ICD-10-CM | POA: Diagnosis not present

## 2019-10-22 ENCOUNTER — Observation Stay (HOSPITAL_COMMUNITY): Payer: Medicare Other

## 2019-10-22 ENCOUNTER — Encounter (HOSPITAL_COMMUNITY): Payer: Self-pay | Admitting: Emergency Medicine

## 2019-10-22 ENCOUNTER — Other Ambulatory Visit: Payer: Self-pay | Admitting: Physician Assistant

## 2019-10-22 ENCOUNTER — Inpatient Hospital Stay (HOSPITAL_COMMUNITY)
Admission: EM | Admit: 2019-10-22 | Discharge: 2019-10-27 | DRG: 812 | Disposition: A | Discharge status: Home / Self Care | Payer: Medicare Other | Attending: Internal Medicine | Admitting: Internal Medicine

## 2019-10-22 ENCOUNTER — Other Ambulatory Visit: Payer: Self-pay

## 2019-10-22 DIAGNOSIS — M069 Rheumatoid arthritis, unspecified: Secondary | ICD-10-CM | POA: Diagnosis present

## 2019-10-22 DIAGNOSIS — K5730 Diverticulosis of large intestine without perforation or abscess without bleeding: Secondary | ICD-10-CM | POA: Diagnosis not present

## 2019-10-22 DIAGNOSIS — E782 Mixed hyperlipidemia: Secondary | ICD-10-CM | POA: Diagnosis present

## 2019-10-22 DIAGNOSIS — D509 Iron deficiency anemia, unspecified: Principal | ICD-10-CM | POA: Diagnosis present

## 2019-10-22 DIAGNOSIS — D649 Anemia, unspecified: Secondary | ICD-10-CM | POA: Diagnosis present

## 2019-10-22 DIAGNOSIS — Z79891 Long term (current) use of opiate analgesic: Secondary | ICD-10-CM

## 2019-10-22 DIAGNOSIS — R059 Cough, unspecified: Secondary | ICD-10-CM

## 2019-10-22 DIAGNOSIS — M1A9XX0 Chronic gout, unspecified, without tophus (tophi): Secondary | ICD-10-CM | POA: Diagnosis present

## 2019-10-22 DIAGNOSIS — R0602 Shortness of breath: Secondary | ICD-10-CM | POA: Diagnosis not present

## 2019-10-22 DIAGNOSIS — I350 Nonrheumatic aortic (valve) stenosis: Secondary | ICD-10-CM | POA: Diagnosis present

## 2019-10-22 DIAGNOSIS — Z87440 Personal history of urinary (tract) infections: Secondary | ICD-10-CM

## 2019-10-22 DIAGNOSIS — Z6830 Body mass index (BMI) 30.0-30.9, adult: Secondary | ICD-10-CM

## 2019-10-22 DIAGNOSIS — Z955 Presence of coronary angioplasty implant and graft: Secondary | ICD-10-CM

## 2019-10-22 DIAGNOSIS — Z880 Allergy status to penicillin: Secondary | ICD-10-CM

## 2019-10-22 DIAGNOSIS — M353 Polymyalgia rheumatica: Secondary | ICD-10-CM | POA: Diagnosis present

## 2019-10-22 DIAGNOSIS — I2510 Atherosclerotic heart disease of native coronary artery without angina pectoris: Secondary | ICD-10-CM | POA: Diagnosis present

## 2019-10-22 DIAGNOSIS — I10 Essential (primary) hypertension: Secondary | ICD-10-CM | POA: Diagnosis present

## 2019-10-22 DIAGNOSIS — Z79899 Other long term (current) drug therapy: Secondary | ICD-10-CM

## 2019-10-22 DIAGNOSIS — Z7952 Long term (current) use of systemic steroids: Secondary | ICD-10-CM

## 2019-10-22 DIAGNOSIS — B952 Enterococcus as the cause of diseases classified elsewhere: Secondary | ICD-10-CM | POA: Diagnosis present

## 2019-10-22 DIAGNOSIS — Z794 Long term (current) use of insulin: Secondary | ICD-10-CM

## 2019-10-22 DIAGNOSIS — T45525A Adverse effect of antithrombotic drugs, initial encounter: Secondary | ICD-10-CM | POA: Diagnosis present

## 2019-10-22 DIAGNOSIS — N390 Urinary tract infection, site not specified: Secondary | ICD-10-CM | POA: Diagnosis not present

## 2019-10-22 DIAGNOSIS — E876 Hypokalemia: Secondary | ICD-10-CM | POA: Diagnosis not present

## 2019-10-22 DIAGNOSIS — J309 Allergic rhinitis, unspecified: Secondary | ICD-10-CM | POA: Diagnosis present

## 2019-10-22 DIAGNOSIS — K633 Ulcer of intestine: Secondary | ICD-10-CM | POA: Diagnosis present

## 2019-10-22 DIAGNOSIS — Z7902 Long term (current) use of antithrombotics/antiplatelets: Secondary | ICD-10-CM

## 2019-10-22 DIAGNOSIS — E119 Type 2 diabetes mellitus without complications: Secondary | ICD-10-CM | POA: Diagnosis present

## 2019-10-22 DIAGNOSIS — I480 Paroxysmal atrial fibrillation: Secondary | ICD-10-CM | POA: Diagnosis not present

## 2019-10-22 DIAGNOSIS — E669 Obesity, unspecified: Secondary | ICD-10-CM | POA: Diagnosis present

## 2019-10-22 DIAGNOSIS — Z8249 Family history of ischemic heart disease and other diseases of the circulatory system: Secondary | ICD-10-CM

## 2019-10-22 DIAGNOSIS — Z90710 Acquired absence of both cervix and uterus: Secondary | ICD-10-CM

## 2019-10-22 DIAGNOSIS — Z66 Do not resuscitate: Secondary | ICD-10-CM | POA: Diagnosis not present

## 2019-10-22 DIAGNOSIS — Z79890 Hormone replacement therapy (postmenopausal): Secondary | ICD-10-CM

## 2019-10-22 DIAGNOSIS — Z20822 Contact with and (suspected) exposure to covid-19: Secondary | ICD-10-CM | POA: Diagnosis present

## 2019-10-22 DIAGNOSIS — D508 Other iron deficiency anemias: Secondary | ICD-10-CM | POA: Diagnosis not present

## 2019-10-22 DIAGNOSIS — Z885 Allergy status to narcotic agent status: Secondary | ICD-10-CM

## 2019-10-22 DIAGNOSIS — K219 Gastro-esophageal reflux disease without esophagitis: Secondary | ICD-10-CM | POA: Diagnosis present

## 2019-10-22 DIAGNOSIS — K648 Other hemorrhoids: Secondary | ICD-10-CM | POA: Diagnosis present

## 2019-10-22 DIAGNOSIS — I252 Old myocardial infarction: Secondary | ICD-10-CM

## 2019-10-22 LAB — RETICULOCYTES
Immature Retic Fract: 34.2 % — ABNORMAL HIGH (ref 2.3–15.9)
RBC.: 3.32 MIL/uL — ABNORMAL LOW (ref 3.87–5.11)
Retic Count, Absolute: 95.3 10*3/uL (ref 19.0–186.0)
Retic Ct Pct: 2.9 % (ref 0.4–3.1)

## 2019-10-22 LAB — COMPREHENSIVE METABOLIC PANEL
ALT: 13 U/L (ref 0–44)
AST: 14 U/L — ABNORMAL LOW (ref 15–41)
Albumin: 3.4 g/dL — ABNORMAL LOW (ref 3.5–5.0)
Alkaline Phosphatase: 44 U/L (ref 38–126)
Anion gap: 11 (ref 5–15)
BUN: 18 mg/dL (ref 8–23)
CO2: 23 mmol/L (ref 22–32)
Calcium: 9 mg/dL (ref 8.9–10.3)
Chloride: 105 mmol/L (ref 98–111)
Creatinine, Ser: 1.1 mg/dL — ABNORMAL HIGH (ref 0.44–1.00)
GFR calc Af Amer: 53 mL/min — ABNORMAL LOW (ref >60)
GFR calc non Af Amer: 46 mL/min — ABNORMAL LOW (ref >60)
Glucose, Bld: 217 mg/dL — ABNORMAL HIGH (ref 70–99)
Potassium: 4.3 mmol/L (ref 3.5–5.1)
Sodium: 139 mmol/L (ref 135–145)
Total Bilirubin: 0.6 mg/dL (ref 0.3–1.2)
Total Protein: 6.5 g/dL (ref 6.5–8.1)

## 2019-10-22 LAB — CBC
HCT: 24 % — ABNORMAL LOW (ref 36.0–46.0)
Hemoglobin: 6.9 g/dL — CL (ref 12.0–15.0)
MCH: 22.3 pg — ABNORMAL LOW (ref 26.0–34.0)
MCHC: 28.8 g/dL — ABNORMAL LOW (ref 30.0–36.0)
MCV: 77.4 fL — ABNORMAL LOW (ref 80.0–100.0)
Platelets: 458 10*3/uL — ABNORMAL HIGH (ref 150–400)
RBC: 3.1 MIL/uL — ABNORMAL LOW (ref 3.87–5.11)
RDW: 19.7 % — ABNORMAL HIGH (ref 11.5–15.5)
WBC: 12.4 10*3/uL — ABNORMAL HIGH (ref 4.0–10.5)
nRBC: 0.2 % (ref 0.0–0.2)

## 2019-10-22 LAB — HEMOGLOBIN A1C
Hgb A1c MFr Bld: 7.2 % — ABNORMAL HIGH (ref 4.8–5.6)
Mean Plasma Glucose: 159.94 mg/dL

## 2019-10-22 LAB — IRON AND TIBC
Iron: 9 ug/dL — ABNORMAL LOW (ref 28–170)
Saturation Ratios: 2 % — ABNORMAL LOW (ref 10.4–31.8)
TIBC: 452 ug/dL — ABNORMAL HIGH (ref 250–450)
UIBC: 443 ug/dL

## 2019-10-22 LAB — HEMOGLOBIN: Hemoglobin: 7.2 g/dL — ABNORMAL LOW (ref 12.0–15.0)

## 2019-10-22 LAB — VITAMIN B12: Vitamin B-12: 343 pg/mL (ref 180–914)

## 2019-10-22 LAB — POC OCCULT BLOOD, ED: Fecal Occult Bld: NEGATIVE

## 2019-10-22 LAB — SARS CORONAVIRUS 2 BY RT PCR (HOSPITAL ORDER, PERFORMED IN ~~LOC~~ HOSPITAL LAB): SARS Coronavirus 2: NEGATIVE

## 2019-10-22 LAB — PREPARE RBC (CROSSMATCH)

## 2019-10-22 LAB — LACTATE DEHYDROGENASE: LDH: 197 U/L — ABNORMAL HIGH (ref 98–192)

## 2019-10-22 LAB — CBG MONITORING, ED: Glucose-Capillary: 128 mg/dL — ABNORMAL HIGH (ref 70–99)

## 2019-10-22 LAB — FERRITIN: Ferritin: 8 ng/mL — ABNORMAL LOW (ref 11–307)

## 2019-10-22 LAB — TSH: TSH: 2.153 u[IU]/mL (ref 0.350–4.500)

## 2019-10-22 LAB — ABO/RH: ABO/RH(D): O POS

## 2019-10-22 MED ORDER — ALBUTEROL SULFATE (2.5 MG/3ML) 0.083% IN NEBU: 2.5000 mg | INHALATION_SOLUTION | RESPIRATORY_TRACT | Status: DC | PRN

## 2019-10-22 MED ORDER — METHOTREXATE 2.5 MG PO TABS: 12.5000 mg | ORAL_TABLET | ORAL | Status: DC

## 2019-10-22 MED ORDER — INSULIN ASPART 100 UNIT/ML ~~LOC~~ SOLN
0.0000 [IU] | SUBCUTANEOUS | Status: DC
  Administered 2019-10-22: 1 [IU] via SUBCUTANEOUS
  Administered 2019-10-23: 5 [IU] via SUBCUTANEOUS
  Administered 2019-10-23 (×2): 1 [IU] via SUBCUTANEOUS
  Administered 2019-10-23: 5 [IU] via SUBCUTANEOUS
  Administered 2019-10-24 (×3): 1 [IU] via SUBCUTANEOUS
  Administered 2019-10-24: 3 [IU] via SUBCUTANEOUS
  Administered 2019-10-25 – 2019-10-26 (×3): 2 [IU] via SUBCUTANEOUS
  Administered 2019-10-26: 3 [IU] via SUBCUTANEOUS
  Administered 2019-10-27 (×2): 1 [IU] via SUBCUTANEOUS
  Administered 2019-10-27: 3 [IU] via SUBCUTANEOUS
  Administered 2019-10-27: 2 [IU] via SUBCUTANEOUS

## 2019-10-22 MED ORDER — ATORVASTATIN CALCIUM 40 MG PO TABS
40.0000 mg | ORAL_TABLET | Freq: Every day | ORAL | Status: DC
  Administered 2019-10-22 – 2019-10-26 (×5): 40 mg via ORAL
  Filled 2019-10-22 (×5): qty 1

## 2019-10-22 MED ORDER — INSULIN DETEMIR 100 UNIT/ML FLEXPEN: 25.0000 [IU] | PEN_INJECTOR | Freq: Every day | SUBCUTANEOUS | Status: DC

## 2019-10-22 MED ORDER — INSULIN DETEMIR 100 UNIT/ML ~~LOC~~ SOLN
14.0000 [IU] | Freq: Every day | SUBCUTANEOUS | Status: DC
  Administered 2019-10-23 – 2019-10-25 (×3): 14 [IU] via SUBCUTANEOUS
  Filled 2019-10-22 (×4): qty 0.14

## 2019-10-22 MED ORDER — INSULIN DETEMIR 100 UNIT/ML ~~LOC~~ SOLN: 25.0000 [IU] | Freq: Every day | SUBCUTANEOUS | Status: DC

## 2019-10-22 MED ORDER — PREDNISONE 1 MG PO TABS
4.0000 mg | ORAL_TABLET | Freq: Every day | ORAL | Status: DC
  Administered 2019-10-23 – 2019-10-27 (×5): 4 mg via ORAL
  Filled 2019-10-22 (×6): qty 4

## 2019-10-22 MED ORDER — ACETAMINOPHEN 325 MG PO TABS: 650.0000 mg | ORAL_TABLET | Freq: Four times a day (QID) | ORAL | Status: DC | PRN

## 2019-10-22 MED ORDER — ONDANSETRON HCL 4 MG PO TABS: 4.0000 mg | ORAL_TABLET | Freq: Four times a day (QID) | ORAL | Status: DC | PRN

## 2019-10-22 MED ORDER — TRAMADOL HCL 50 MG PO TABS: 50.0000 mg | ORAL_TABLET | Freq: Four times a day (QID) | ORAL | Status: DC | PRN

## 2019-10-22 MED ORDER — SODIUM CHLORIDE 0.9% FLUSH
3.0000 mL | Freq: Two times a day (BID) | INTRAVENOUS | Status: DC
  Administered 2019-10-23 – 2019-10-27 (×7): 3 mL via INTRAVENOUS

## 2019-10-22 MED ORDER — DORZOLAMIDE HCL 2 % OP SOLN: 1.0000 [drp] | Freq: Two times a day (BID) | OPHTHALMIC | Status: DC

## 2019-10-22 MED ORDER — SODIUM CHLORIDE 0.9 % IV SOLN: INTRAVENOUS | Status: DC

## 2019-10-22 MED ORDER — PANTOPRAZOLE SODIUM 40 MG PO TBEC: 40.0000 mg | DELAYED_RELEASE_TABLET | Freq: Every day | ORAL | Status: DC

## 2019-10-22 MED ORDER — ESTRADIOL 0.1 MG/GM VA CREA
1.0000 | TOPICAL_CREAM | Freq: Every day | VAGINAL | Status: DC
  Administered 2019-10-23 – 2019-10-27 (×5): 1 via VAGINAL
  Filled 2019-10-22 (×3): qty 42.5

## 2019-10-22 MED ORDER — SODIUM CHLORIDE 0.9% IV SOLUTION: Freq: Once | INTRAVENOUS | Status: AC

## 2019-10-22 MED ORDER — FOLIC ACID 1 MG PO TABS
2.0000 mg | ORAL_TABLET | Freq: Every day | ORAL | Status: DC
  Administered 2019-10-23 – 2019-10-27 (×5): 2 mg via ORAL
  Filled 2019-10-22 (×5): qty 2

## 2019-10-22 MED ORDER — ONDANSETRON HCL 4 MG/2ML IJ SOLN: 4.0000 mg | Freq: Four times a day (QID) | INTRAMUSCULAR | Status: DC | PRN

## 2019-10-22 MED ORDER — ACETAMINOPHEN 650 MG RE SUPP: 650.0000 mg | Freq: Four times a day (QID) | RECTAL | Status: DC | PRN

## 2019-10-22 NOTE — ED Triage Notes (Signed)
Pt sent by PCP for hemoglobin of 7.3. Pt denies obvious bleeding, reports fatigue.

## 2019-10-22 NOTE — H&P (Addendum)
History and Physical    Sophia Ward [id number] DOB: April 08, 1933 DOA: 10/22/2019  PCP: [name], MD  Patient coming from: Home  I have personally briefly reviewed patient's old medical records in [place]  Chief Complaint: symptomatic anemia   HPI: Sophia Ward is a 86 y.o. female with medical history significant of  GERD, CAD s/p MI s/p stent on plavix, DMII, HTN , HLD,RA on MTX, PMR on chronic prednisone, spinal stenosis managed with epidural injections who has had interim history of increase fatigue x 2-3 weeks. Patient states she followed up with her primary for routine visit and on evaluation was found to have drop in hgb to 7.9 from base of 13.4 on 10/02/18. Plan was made for continued monitoring  And recheck in 2 weeks. Patient states during that time her fatigued continued and she began to have increasing shortness of breath with exertion. Patient also noted that she has increase loose stools that were dark but states this has been chronic for some time. She notes no brbpr, no blood in her urine, nose bleeds, abdominal or back pain. She however noted associated symptoms of palpitations and mild presyncope. Due to patient progressive symptoms and noted decrease on recheck of labs by pcp patient was referred to ed for further evaluation and treatment of symptomatic anemia.  Of note patient on further ros notes that she had difficulty with recurrent uti and was last treated with macrobid Oct 13, 2019. She notes she has had macrobid one prior without any untoward effects.    ED Course:  Vitals: afeb, bp 142/57, hr 80, rr 16 ,s at 96% Labs: Wbc: of 12.4 hgb 6.9 down form 7.9 with base of 13.4, MCV 77 Heme negative stools  CT negative for  RP bleed  Cr 1.10 at baseline  Patient admitted for further work and treatment of symptomatic anemia  Review of Systems: As per HPI otherwise 10 point review of systems negative.   Past Medical History:  Diagnosis Date  .  Allergic rhinitis   . Anemia   . Arthritis    hands  . BPPV (benign paroxysmal positional vertigo)   . Coronary artery disease   . Coronary atherosclerosis of native coronary artery 01/30/2014  . Diabetes mellitus without complication ([place])   . Essential hypertension, benign 01/30/2014  . GERD (gastroesophageal reflux disease)   . Hypertension   . Mixed hyperlipidemia 01/30/2014  . Myocardial infarction ([place]) 2004   mild MI-no damage- had stents  . Osteopenia   . Post-menopausal   . Rheumatoid arthritis ([place])   . Spinal stenosis   . Stenosing tenosynovitis     Past Surgical History:  Procedure Laterality Date  . ABDOMINAL HYSTERECTOMY    . APPENDECTOMY    . CARDIAC CATHETERIZATION  2004  . carpel tunnel    . COLONOSCOPY WITH PROPOFOL N/A 10/30/2012   Procedure: COLONOSCOPY WITH PROPOFOL;  Surgeon: Garlan Fair, MD;  Location: [facility];  Service: Endoscopy;  Laterality: N/A;  . CORONARY ANGIOPLASTY  2004  . DILATION AND CURETTAGE OF UTERUS    . ESOPHAGOGASTRODUODENOSCOPY (EGD) WITH PROPOFOL N/A 10/30/2012   Procedure: ESOPHAGOGASTRODUODENOSCOPY (EGD) WITH PROPOFOL;  Surgeon: Garlan Fair, MD;  [facility];  Service: Endoscopy;  Laterality: N/A;  . EYE SURGERY     bilateral cataract with lens implant  . EYE SURGERY    . INJECTION OF SILICONE OIL Left [id number]   Procedure: Injection Of Silicone Oil;  Surgeon: Jalene Mullet, MD;  Location:  [facility];  Service: Ophthalmology;  Laterality: Left;  . left shouler surgery    . PHOTOCOAGULATION WITH LASER Left 02/12/2019   Procedure: Photocoagulation With Laser;  Surgeon: Jalene Mullet, MD;  Location: [facility];  Service: Ophthalmology;  Laterality: Left;  . 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 02/12/2019   Procedure: REPAIR OF COMPLEX TRACTION RETINAL DETACHMENT;  Surgeon: Jalene Mullet, MD;  Location: [facility];  Service: Ophthalmology;  Laterality: Left;  . TONSILLECTOMY    . VITRECTOMY 25 GAUGE WITH SCLERAL  BUCKLE Left 02/12/2019   Procedure: Vitrectomy 25 Gauge With Scleral Buckle;  Surgeon: Jalene Mullet, MD;  Location: [facility];  Service: Ophthalmology;  Laterality: Left;     reports that she has never smoked. She has never used smokeless tobacco. She reports that she does not drink alcohol or use drugs.  Allergies  Allergen Reactions  . Codeine Nausea And Vomiting    MAKES HER SICK  . Glimepiride Other (See Comments)    Can't remember  . Metformin And Related Diarrhea  . Penicillins Rash    Family History  Problem Relation Age of Onset  . CAD Mother   . Heart attack Mother   . CAD Father   . Heart Problems Brother        open heart surgery   . Arthritis Daughter        psoriatic arthritis     Prior to Admission medications   Medication Sig Start Date End Date Taking? Authorizing Provider  amLODipine (NORVASC) 5 MG tablet Take 2 tablets (10 mg total) by mouth every morning. 04/30/19   Jettie Booze, MD  atorvastatin (LIPITOR) 40 MG tablet Take 1 tablet (40 mg total) by mouth at bedtime. 04/30/19   Jettie Booze, MD  B-D ULTRAFINE III SHORT PEN 31G X 8 MM MISC 1 each by Other route in the morning, at noon, in the evening, and at bedtime.  07/29/19   [provider]  Calcium Carbonate-Vitamin D (CALCIUM + D PO) Take 1 tablet by mouth every morning.    [provider]  Cholecalciferol (VITAMIN D3) 1000 UNITS CAPS Take 1,000 Units by mouth 2 (two) times daily.     [provider]  clopidogrel (PLAVIX) 75 MG tablet Take 75 mg by mouth daily.    [provider]  Coenzyme Q10 (COQ10) 200 MG CAPS Take 1 capsule by mouth daily.     [provider]  desonide (DESOWEN) 0.05 % lotion Apply 1 application topically as needed.    [provider]  diclofenac sodium (VOLTAREN) 1 % GEL Apply 3 g to 3 large joints up to 3 times daily 10/31/17   Ofilia Neas, PA-C  dorzolamide (TRUSOPT) 2 % ophthalmic solution Place 1 drop into the left  eye 2 (two) times daily. 10/02/19   [provider]  estradiol (ESTRACE) 0.1 MG/GM vaginal cream Place 1 Applicatorful vaginally daily.  08/21/19   [provider]  folic acid (FOLVITE) 1 MG tablet Take 2 tablets (2 mg total) by mouth daily. 09/30/19   Ofilia Neas, PA-C  insulin lispro (HUMALOG KWIKPEN) 100 UNIT/ML KiwkPen Inject 7 Units into the skin 3 (three) times daily.     [provider]  ketorolac (ACULAR) 0.4 % SOLN Place 1 drop into the left eye in the morning, at noon, and at bedtime.  08/29/19   [provider]  LEVEMIR FLEXTOUCH 100 UNIT/ML Pen Inject 25 Units into the skin daily.  08/25/16  [provider]  methotrexate 2.5 MG tablet TAKE 5 TABLETS BY MOUTH ONCE A WEEK. Caution:Chemotherapy. Protect from light. Patient taking differently: Take 2.5 mg by mouth See admin instructions. TAKE 5 TABLETS BY MOUTH ONCE A WEEK. Caution:Chemotherapy. Protect from light. 09/30/19   Ofilia Neas, PA-C  metoprolol succinate (TOPROL-XL) 100 MG 24 hr tablet Take 1 tablet (100 mg total) by mouth every morning. 04/30/19   Jettie Booze, MD  nitrofurantoin, macrocrystal-monohydrate, (MACROBID) 100 MG capsule Take 100 mg by mouth 2 (two) times daily. 10/04/19   [provider]  nitroGLYCERIN (NITROSTAT) 0.4 MG SL tablet DISSOLVE ONE TABLET UNDER THE TONGUE EVERY 5 MINUTES AS NEEDED FOR CHEST PAIN.  DO NOT EXCEED A TOTAL OF 3 DOSES IN 15 MINUTES Patient taking differently: Place 0.4 mg under the tongue every 5 (five) minutes as needed for chest pain.  04/30/19   Jettie Booze, MD  Omega-3 Fatty Acids (FISH OIL PO) Take 1 capsule by mouth every morning.    [provider]  pantoprazole (PROTONIX) 40 MG tablet Take 40 mg by mouth daily after lunch.    [provider]  predniSONE (DELTASONE) 1 MG tablet Take 4 tablets (4 mg total) by mouth daily with breakfast. 08/19/19   Bo Merino, MD  ramipril (ALTACE) 10 MG tablet Take 20 mg  by mouth every morning.    [provider]  traMADol (ULTRAM) 50 MG tablet Take 50 mg by mouth every 6 (six) hours as needed for moderate pain.     [provider]    Physical Exam: Vitals:   10/22/19 1702 10/22/19 1954 10/22/19 1955 10/22/19 1958  BP:  (!) 128/92    Pulse:   81   Resp:   (!) 26   Temp:    98 F (36.7 C)  TempSrc:    Oral  SpO2:   98%   Weight: 78 kg     Height: 5' 2.5" (1.588 m)       Constitutional: NAD, calm, comfortable Vitals:   10/22/19 1702 10/22/19 1954 10/22/19 1955 10/22/19 1958  BP:  (!) 128/92    Pulse:   81   Resp:   (!) 26   Temp:    98 F (36.7 C)  TempSrc:    Oral  SpO2:   98%   Weight: 78 kg     Height: 5' 2.5" (1.588 m)      Eyes: PERRL, lids and conjunctivae normal ENMT: Mucous membranes are moist. Posterior pharynx clear of any exudate or lesions.Normal dentition.  Neck: normal, supple, no masses, no thyromegaly Respiratory: clear to auscultation bilaterally, no wheezing, no crackles. Normal respiratory effort. No accessory muscle use.  Cardiovascular: Regular rate and rhythm, no murmurs / rubs / gallops. No extremity edema. 2+ pedal pulses. No carotid bruits.  Abdomen: no tenderness, no masses palpated. No hepatosplenomegaly. Bowel sounds positive.  Musculoskeletal: no clubbing / cyanosis. No joint deformity upper and lower extremities. Good ROM, no contractures. Normal muscle tone.  Skin: no rashes, lesions, ulcers. No induration Neurologic: CN 2-12 grossly intact. Sensation intact, DTR normal. Strength 5/5 in all 4.  Psychiatric: Normal judgment and insight. Alert and oriented x 3. Normal mood.    Labs on Admission: I have personally reviewed following labs and imaging studies  CBC: Recent Labs  Lab 10/22/19 1720  WBC 12.4*  HGB 6.9*  HCT 24.0*  MCV 77.4*  PLT [id number]*   Basic Metabolic Panel: Recent Labs  Lab 10/22/19 1720  NA  139  K 4.3  CL 105  CO2 23  GLUCOSE 217*  BUN 18  CREATININE 1.10*    CALCIUM 9.0   GFR: Estimated Creatinine Clearance: 37.3 mL/min (A) (by C-G formula based on SCr of 1.1 mg/dL (H)). Liver Function Tests: Recent Labs  Lab 10/22/19 1720  AST 14*  ALT 13  ALKPHOS 44  BILITOT 0.6  PROT 6.5  ALBUMIN 3.4*   No results for input(s): LIPASE, AMYLASE in the last 168 hours. No results for input(s): AMMONIA in the last 168 hours. Coagulation Profile: No results for input(s): INR, PROTIME in the last 168 hours. Cardiac Enzymes: No results for input(s): CKTOTAL, CKMB, CKMBINDEX, TROPONINI in the last 168 hours. BNP (last 3 results) No results for input(s): PROBNP in the last 8760 hours. HbA1C: No results for input(s): HGBA1C in the last 72 hours. CBG: No results for input(s): GLUCAP in the last 168 hours. Lipid Profile: No results for input(s): CHOL, HDL, LDLCALC, TRIG, CHOLHDL, LDLDIRECT in the last 72 hours. Thyroid Function Tests: No results for input(s): TSH, T4TOTAL, FREET4, T3FREE, THYROIDAB in the last 72 hours. Anemia Panel: No results for input(s): VITAMINB12, FOLATE, FERRITIN, TIBC, IRON, RETICCTPCT in the last 72 hours. Urine analysis:    Component Value Date/Time   COLORURINE YELLOW 11/27/2017 1024   APPEARANCEUR TURBID (A) 11/27/2017 1024   LABSPEC 1.014 11/27/2017 1024   PHURINE 6.0 11/27/2017 1024   GLUCOSEU NEGATIVE 11/27/2017 1024   HGBUR 1+ (A) 11/27/2017 1024   BILIRUBINUR NEGATIVE 10/28/2016 1124   KETONESUR NEGATIVE 11/27/2017 1024   PROTEINUR 1+ (A) 11/27/2017 1024   NITRITE NEGATIVE 11/27/2017 1024   LEUKOCYTESUR 3+ (A) 11/27/2017 1024    Radiological Exams on Admission: No results found.  EKG: Independently reviewed. pending  Assessment/Plan Symptomatic Microcytic Anemia -concern for possible prior gi bleed with risk factor of plavix hx of gerd and intermittent dark stools  -of note in ed had brown heme negative stools , however symptoms initial started weeks prior -possible related to drug reaction /hemolytic  anemia due to macrobid  -possible related to RA , MTX  -anemia panel/G6PD testing -hold MTX for now /resume as able - transfuse to abuse 7 -cycle h/h  -place on ppi -gi consult to assist with further evaluation ( please call in am ) -if gi work up negative consider heme consult    Hx of recurrent UTI  -followed by I D  -last episode early may s/p tx may 16    Leukocytosis  -r/o uti/pna  -ua/cxr pending  -of note patient denies any urinary symptoms at this time   GERD -ppi   CAD s/p MI s/p stent -hold  plavix for now  -gi to resume s/p evaluation   DMII -is/fs    HTN -borderline bp  -resume bp meds as blood pressure tolerates    HLD -resume home meds once tolerating diet   RA /PMR -hold MTX for now resume as able  -continue prednisone     spinal stenosis - managed with epidural injections -no active issues   DVT prophylaxis:scd Code Status: DNR/DNI Family Communication:  Disposition Plan: discussed with dtr and patient  at bed side onsults called: Gi consult [facility] requested  Admission status: inpatient   Clance Boll MD [facility]  If 7PM-7AM, please contact night-coverage [email or web address] Password [facility]  10/22/2019, 9:01 PM

## 2019-10-22 NOTE — ED Triage Notes (Signed)
Hgb 6.9  

## 2019-10-22 NOTE — ED Provider Notes (Signed)
[facility] EMERGENCY DEPARTMENT Provider Note   CSN: [id number] Arrival date & time: 10/22/19  1658     History Chief Complaint  Patient presents with  . Abnormal Lab    Sophia Ward is a 86 y.o. female.  HPI Patient presents with anemia.  Sent in for an anemia from PCP.  Reportedly had hemoglobin of 7.3 from PCP.  Was 7.93 weeks ago but baseline a months ago appears to be around 13.  Has had dark stool but not black stool.  No known bleeding although she does bruise easily due to being on Plavix.  No chest pain.  States she has had more shortness of breath and dizziness the last 4 days.  No abdominal pain.    Past Medical History:  Diagnosis Date  . Allergic rhinitis   . Anemia   . Arthritis    hands  . BPPV (benign paroxysmal positional vertigo)   . Coronary artery disease   . Coronary atherosclerosis of native coronary artery 01/30/2014  . Diabetes mellitus without complication ([place])   . Essential hypertension, benign 01/30/2014  . GERD (gastroesophageal reflux disease)   . Hypertension   . Mixed hyperlipidemia 01/30/2014  . Myocardial infarction ([place]) 2004   mild MI-no damage- had stents  . Osteopenia   . Post-menopausal   . Rheumatoid arthritis ([place])   . Spinal stenosis   . Stenosing tenosynovitis     Patient Active Problem List   Diagnosis Date Noted  . Polymyalgia rheumatica ([place]) 11/25/2016  . Primary osteoarthritis of both hands 11/25/2016  . Bilateral primary osteoarthritis of knee 11/25/2016  . Osteopenia of multiple sites 11/25/2016  . 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 Osteoarthritis of lumbar spine 10/28/2016  . History of diabetes mellitus 10/27/2016  . History of gastroesophageal reflux (GERD) 10/27/2016  . Rheumatoid arthritis involving multiple sites with positive rheumatoid factor ([place]) 10/14/2016  . Elevated C-reactive protein (CRP) 10/14/2016  . Elevated sed rate 10/14/2016  . High risk medication use  10/14/2016  . Coronary atherosclerosis of native coronary artery 01/30/2014  . Mixed hyperlipidemia 01/30/2014  . Essential hypertension, benign 01/30/2014  . Obesity, unspecified 01/30/2014    Past Surgical History:  Procedure Laterality Date  . ABDOMINAL HYSTERECTOMY    . APPENDECTOMY    . CARDIAC CATHETERIZATION  2004  . carpel tunnel    . COLONOSCOPY WITH PROPOFOL N/A 10/30/2012   Procedure: COLONOSCOPY WITH PROPOFOL;  Surgeon: Garlan Fair, MD;  [facility];  Service: Endoscopy;  Laterality: N/A;  . CORONARY ANGIOPLASTY  2004  . DILATION AND CURETTAGE OF UTERUS    . ESOPHAGOGASTRODUODENOSCOPY (EGD) WITH PROPOFOL N/A 10/30/2012   Procedure: ESOPHAGOGASTRODUODENOSCOPY (EGD) WITH PROPOFOL;  Surgeon: Garlan Fair, MD;  [facility];  Service: Endoscopy;  Laterality: N/A;  . EYE SURGERY     bilateral cataract with lens implant  . EYE SURGERY    . INJECTION OF SILICONE OIL Left [id number]   Procedure: Injection Of Silicone Oil;  Surgeon: Jalene Mullet, MD;  Location: [facility];  Service: Ophthalmology;  Laterality: Left;  . left shouler surgery    . PHOTOCOAGULATION WITH LASER Left 02/12/2019   Procedure: Photocoagulation With Laser;  Surgeon: Jalene Mullet, MD;  Location: [facility];  Service: Ophthalmology;  Laterality: Left;  . 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 02/12/2019   Procedure: REPAIR OF COMPLEX TRACTION RETINAL DETACHMENT;  Surgeon: Jalene Mullet, MD;  Location: [facility];  Service: Ophthalmology;  Laterality: Left;  .  TONSILLECTOMY    . VITRECTOMY 25 GAUGE WITH SCLERAL BUCKLE Left 02/12/2019   Procedure: Vitrectomy 25 Gauge With Scleral Buckle;  Surgeon: Jalene Mullet, MD;  Location: [facility];  Service: Ophthalmology;  Laterality: Left;     OB History   No obstetric history on file.     Family History  Problem Relation Age of Onset  . CAD Mother   . Heart attack Mother   . CAD Father   . Heart Problems Brother        open heart  surgery   . Arthritis Daughter        psoriatic arthritis     Social History   Tobacco Use  . Smoking status: Never Smoker  . Smokeless tobacco: Never Used  Substance Use Topics  . Alcohol use: No  . Drug use: No    Home Medications Prior to Admission medications   Medication Sig Start Date End Date Taking? Authorizing Provider  amLODipine (NORVASC) 5 MG tablet Take 2 tablets (10 mg total) by mouth every morning. 04/30/19   Jettie Booze, MD  atorvastatin (LIPITOR) 40 MG tablet Take 1 tablet (40 mg total) by mouth at bedtime. 04/30/19   Jettie Booze, MD  B-D ULTRAFINE III SHORT PEN 31G X 8 MM MISC SMARTSIG:1 Needle Injection 4 Times Daily 07/29/19   [provider]  Calcium Carbonate-Vitamin D (CALCIUM + D PO) Take 1 tablet by mouth every morning.    [provider]  Cholecalciferol (VITAMIN D3) 1000 UNITS CAPS Take 1,000 Units by mouth 2 (two) times daily.     [provider]  clopidogrel (PLAVIX) 75 MG tablet Take 75 mg by mouth daily.    [provider]  Coenzyme Q10 (COQ10) 200 MG CAPS Take by mouth daily.    [provider]  desonide (DESOWEN) 0.05 % lotion Apply 1 application topically as needed.    [provider]  diclofenac sodium (VOLTAREN) 1 % GEL Apply 3 g to 3 large joints up to 3 times daily 10/31/17   Ofilia Neas, PA-C  estradiol (ESTRACE) 0.1 MG/GM vaginal cream APPLY TO THE INTRAVAGINAL VAULT DAILY 08/21/19   [provider]  folic acid (FOLVITE) 1 MG tablet Take 2 tablets (2 mg total) by mouth daily. 09/30/19   Ofilia Neas, PA-C  insulin lispro (HUMALOG KWIKPEN) 100 UNIT/ML KiwkPen Humalog KwikPen (U-100) Insulin 100 unit/mL subcutaneous  Injecting 7 at breakfast and 12 at lunch an dinner    [provider]  ketorolac (ACULAR) 0.4 % SOLN INSTILL 1 DROP INTO LEFT EYE THREE TIMES DAILY FOR 3 MONTHS 08/29/19   [provider]  LEVEMIR FLEXTOUCH 100 UNIT/ML Pen 25 Units.  08/25/16    [provider]  methotrexate 2.5 MG tablet TAKE 5 TABLETS BY MOUTH ONCE A WEEK. Caution:Chemotherapy. Protect from light. 09/30/19   Ofilia Neas, PA-C  metoprolol succinate (TOPROL-XL) 100 MG 24 hr tablet Take 1 tablet (100 mg total) by mouth every morning. 04/30/19   Jettie Booze, MD  nitroGLYCERIN (NITROSTAT) 0.4 MG SL tablet DISSOLVE ONE TABLET UNDER THE TONGUE EVERY 5 MINUTES AS NEEDED FOR CHEST PAIN.  DO NOT EXCEED A TOTAL OF 3 DOSES IN 15 MINUTES 04/30/19   Jettie Booze, MD  Omega-3 Fatty Acids (FISH OIL PO) Take 1 capsule by mouth every morning.    [provider]  pantoprazole (PROTONIX) 40 MG tablet Take 40 mg by mouth daily after lunch.    [provider]  predniSONE (DELTASONE) 1 MG tablet Take 4 tablets (4 mg total) by mouth daily with breakfast. 08/19/19   Bo Merino, MD  ramipril (ALTACE) 10 MG tablet Take 20 mg by mouth every morning.    [provider]  traMADol (ULTRAM) 50 MG tablet Take by mouth every 6 (six) hours as needed.    [provider]    Allergies    Codeine and Penicillins  Review of Systems   Review of Systems  Constitutional: Negative for appetite change.  HENT: Negative for congestion.   Respiratory: Positive for shortness of breath.   Gastrointestinal: Negative for abdominal pain and blood in stool.  Genitourinary: Negative for flank pain.  Musculoskeletal: Negative for back pain.  Skin: Negative for rash.  Neurological: Positive for light-headedness.  Psychiatric/Behavioral: Negative for confusion.    Physical Exam Updated Vital Signs BP (!) 128/92   Pulse 81   Temp 98 F (36.7 C) (Oral)   Resp (!) 26   Ht 5' 2.5" (1.588 m)   Wt 78 kg   SpO2 98%   BMI 30.96 kg/m   Physical Exam Vitals reviewed.  HENT:     Head: Normocephalic.     Nose: Nose normal.  Eyes:     Extraocular Movements: Extraocular movements intact.  Cardiovascular:     Rate and Rhythm: Regular rhythm.    Pulmonary:     Effort: Pulmonary effort is normal.  Abdominal:     Tenderness: There is no abdominal tenderness.  Musculoskeletal:        General: No tenderness.     Cervical back: Neck supple.  Skin:    Capillary Refill: Capillary refill takes less than 2 seconds.     Coloration: Skin is pale.     Comments: Some bruising on lower extremities.  Neurological:     Mental Status: She is alert and oriented to person, place, and time.     ED Results / Procedures / Treatments   Labs (all labs ordered are listed, but only abnormal results are displayed) Labs Reviewed  COMPREHENSIVE METABOLIC PANEL - Abnormal; Notable for the following components:      Result Value   Glucose, Bld 217 (*)    Creatinine, Ser 1.10 (*)    Albumin 3.4 (*)    AST 14 (*)    GFR calc non Af Amer 46 (*)    GFR calc Af Amer 53 (*)    All other components within normal limits  CBC - Abnormal; Notable for the following components:   WBC 12.4 (*)    RBC 3.10 (*)    Hemoglobin 6.9 (*)    HCT 24.0 (*)    MCV 77.4 (*)    MCH 22.3 (*)    MCHC 28.8 (*)    RDW 19.7 (*)    Platelets 458 (*)    All other components within normal limits  POC OCCULT BLOOD, ED  TYPE AND SCREEN  ABO/RH    EKG None  Radiology No results found.  Procedures Procedures (including critical care time)  Medications Ordered in ED Medications - No data to display  ED Course  I have reviewed the triage vital signs and the nursing notes.  Pertinent labs & imaging results that were available during my care of the patient were reviewed by me and considered in my medical decision making (see chart for details).    MDM Rules/Calculators/A&P  Patient with symptomatic anemia.  Hemoglobin is 6.9 here.  Patient is guaiac negative with brown stool.  Is on Plavix.  States she has been more short of breath for the last few days.  This is likely symptomatic with anemia although I think likely she has had a gradual  decrease and she is not even more symptomatic.  Blood pressure maintained.  Will admit to hospitalist for further evaluation and treatment.  Will hold off on transfusion until they have their work-up so would not change the results. Final Clinical Impression(s) / ED Diagnoses Final diagnoses:  Symptomatic anemia    Rx / DC Orders ED Discharge Orders    None       Davonna Belling, MD 10/22/19 2003

## 2019-10-23 ENCOUNTER — Encounter (HOSPITAL_COMMUNITY): Payer: Self-pay | Admitting: Internal Medicine

## 2019-10-23 ENCOUNTER — Observation Stay (HOSPITAL_COMMUNITY): Payer: Medicare Other

## 2019-10-23 DIAGNOSIS — I480 Paroxysmal atrial fibrillation: Secondary | ICD-10-CM | POA: Diagnosis not present

## 2019-10-23 DIAGNOSIS — K633 Ulcer of intestine: Secondary | ICD-10-CM | POA: Diagnosis not present

## 2019-10-23 DIAGNOSIS — D649 Anemia, unspecified: Secondary | ICD-10-CM | POA: Diagnosis not present

## 2019-10-23 DIAGNOSIS — E782 Mixed hyperlipidemia: Secondary | ICD-10-CM | POA: Diagnosis not present

## 2019-10-23 DIAGNOSIS — K5000 Crohn's disease of small intestine without complications: Secondary | ICD-10-CM | POA: Diagnosis not present

## 2019-10-23 DIAGNOSIS — K228 Other specified diseases of esophagus: Secondary | ICD-10-CM | POA: Diagnosis not present

## 2019-10-23 DIAGNOSIS — Z90710 Acquired absence of both cervix and uterus: Secondary | ICD-10-CM | POA: Diagnosis not present

## 2019-10-23 DIAGNOSIS — R0602 Shortness of breath: Secondary | ICD-10-CM | POA: Diagnosis not present

## 2019-10-23 DIAGNOSIS — K3189 Other diseases of stomach and duodenum: Secondary | ICD-10-CM | POA: Diagnosis not present

## 2019-10-23 DIAGNOSIS — K219 Gastro-esophageal reflux disease without esophagitis: Secondary | ICD-10-CM | POA: Diagnosis not present

## 2019-10-23 DIAGNOSIS — M1A9XX0 Chronic gout, unspecified, without tophus (tophi): Secondary | ICD-10-CM | POA: Diagnosis present

## 2019-10-23 DIAGNOSIS — Z7902 Long term (current) use of antithrombotics/antiplatelets: Secondary | ICD-10-CM | POA: Diagnosis not present

## 2019-10-23 DIAGNOSIS — Z20822 Contact with and (suspected) exposure to covid-19: Secondary | ICD-10-CM | POA: Diagnosis not present

## 2019-10-23 DIAGNOSIS — I10 Essential (primary) hypertension: Secondary | ICD-10-CM | POA: Diagnosis not present

## 2019-10-23 DIAGNOSIS — R05 Cough: Secondary | ICD-10-CM | POA: Diagnosis not present

## 2019-10-23 DIAGNOSIS — I2510 Atherosclerotic heart disease of native coronary artery without angina pectoris: Secondary | ICD-10-CM | POA: Diagnosis not present

## 2019-10-23 DIAGNOSIS — E119 Type 2 diabetes mellitus without complications: Secondary | ICD-10-CM

## 2019-10-23 DIAGNOSIS — Z794 Long term (current) use of insulin: Secondary | ICD-10-CM | POA: Diagnosis not present

## 2019-10-23 DIAGNOSIS — K653 Choleperitonitis: Secondary | ICD-10-CM | POA: Diagnosis not present

## 2019-10-23 DIAGNOSIS — D509 Iron deficiency anemia, unspecified: Secondary | ICD-10-CM | POA: Diagnosis not present

## 2019-10-23 DIAGNOSIS — E669 Obesity, unspecified: Secondary | ICD-10-CM | POA: Diagnosis present

## 2019-10-23 DIAGNOSIS — M069 Rheumatoid arthritis, unspecified: Secondary | ICD-10-CM | POA: Diagnosis present

## 2019-10-23 DIAGNOSIS — I350 Nonrheumatic aortic (valve) stenosis: Secondary | ICD-10-CM | POA: Diagnosis not present

## 2019-10-23 DIAGNOSIS — N390 Urinary tract infection, site not specified: Secondary | ICD-10-CM | POA: Diagnosis not present

## 2019-10-23 DIAGNOSIS — I252 Old myocardial infarction: Secondary | ICD-10-CM | POA: Diagnosis not present

## 2019-10-23 DIAGNOSIS — K5730 Diverticulosis of large intestine without perforation or abscess without bleeding: Secondary | ICD-10-CM | POA: Diagnosis not present

## 2019-10-23 DIAGNOSIS — Z66 Do not resuscitate: Secondary | ICD-10-CM | POA: Diagnosis not present

## 2019-10-23 DIAGNOSIS — I4891 Unspecified atrial fibrillation: Secondary | ICD-10-CM | POA: Diagnosis not present

## 2019-10-23 DIAGNOSIS — B952 Enterococcus as the cause of diseases classified elsewhere: Secondary | ICD-10-CM | POA: Diagnosis present

## 2019-10-23 DIAGNOSIS — E876 Hypokalemia: Secondary | ICD-10-CM | POA: Diagnosis not present

## 2019-10-23 DIAGNOSIS — K2980 Duodenitis without bleeding: Secondary | ICD-10-CM | POA: Diagnosis not present

## 2019-10-23 DIAGNOSIS — K648 Other hemorrhoids: Secondary | ICD-10-CM | POA: Diagnosis not present

## 2019-10-23 DIAGNOSIS — J309 Allergic rhinitis, unspecified: Secondary | ICD-10-CM | POA: Diagnosis not present

## 2019-10-23 DIAGNOSIS — I361 Nonrheumatic tricuspid (valve) insufficiency: Secondary | ICD-10-CM | POA: Diagnosis not present

## 2019-10-23 DIAGNOSIS — M353 Polymyalgia rheumatica: Secondary | ICD-10-CM | POA: Diagnosis present

## 2019-10-23 LAB — URINALYSIS, ROUTINE W REFLEX MICROSCOPIC
Bilirubin Urine: NEGATIVE
Glucose, UA: NEGATIVE mg/dL
Hgb urine dipstick: NEGATIVE
Ketones, ur: 5 mg/dL — AB
Leukocytes,Ua: NEGATIVE
Nitrite: NEGATIVE
Protein, ur: NEGATIVE mg/dL
Specific Gravity, Urine: 1.01 (ref 1.005–1.030)
pH: 7 (ref 5.0–8.0)

## 2019-10-23 LAB — HEMOGLOBIN AND HEMATOCRIT, BLOOD
HCT: 28.1 % — ABNORMAL LOW (ref 36.0–46.0)
HCT: 27 % — ABNORMAL LOW (ref 36.0–46.0)
Hemoglobin: 8.3 g/dL — ABNORMAL LOW (ref 12.0–15.0)
Hemoglobin: 8 g/dL — ABNORMAL LOW (ref 12.0–15.0)

## 2019-10-23 LAB — CBG MONITORING, ED
Glucose-Capillary: 109 mg/dL — ABNORMAL HIGH (ref 70–99)
Glucose-Capillary: 142 mg/dL — ABNORMAL HIGH (ref 70–99)
Glucose-Capillary: 143 mg/dL — ABNORMAL HIGH (ref 70–99)
Glucose-Capillary: 276 mg/dL — ABNORMAL HIGH (ref 70–99)

## 2019-10-23 LAB — GLUCOSE, CAPILLARY
Glucose-Capillary: 102 mg/dL — ABNORMAL HIGH (ref 70–99)
Glucose-Capillary: 120 mg/dL — ABNORMAL HIGH (ref 70–99)
Glucose-Capillary: 270 mg/dL — ABNORMAL HIGH (ref 70–99)

## 2019-10-23 LAB — OCCULT BLOOD X 1 CARD TO LAB, STOOL: Fecal Occult Bld: NEGATIVE

## 2019-10-23 LAB — FOLATE: Folate: 54.6 ng/mL (ref >5.9)

## 2019-10-23 MED ORDER — SODIUM CHLORIDE 0.9 % IV SOLN: INTRAVENOUS | Status: DC

## 2019-10-23 MED ORDER — PANTOPRAZOLE SODIUM 40 MG IV SOLR
40.0000 mg | Freq: Two times a day (BID) | INTRAVENOUS | Status: DC
  Administered 2019-10-23 – 2019-10-26 (×7): 40 mg via INTRAVENOUS
  Filled 2019-10-23 (×7): qty 40

## 2019-10-23 NOTE — Progress Notes (Signed)
We were text overnight about this consult.  Patient has an [facility] primary care physician and Dr. Alessandra Bevels from [facility] will be covering.  I did call him about this consult.  (Of note, I did go speak to the patient this morning, so she will need to be told that [facility] is the practice covering for her given her PCP.)  Ellouise Newer, PA-C

## 2019-10-23 NOTE — ED Notes (Signed)
Lunch Tray Ordered @ 1035. 

## 2019-10-23 NOTE — ED Notes (Signed)
Gi at bedside

## 2019-10-23 NOTE — ED Notes (Addendum)
Pt readjusted in bed, HOB lowered for comfort, pillow provided. O2 increased to 2L/min for pt comfort

## 2019-10-23 NOTE — Progress Notes (Signed)
[facility] PROGRESS NOTE   Sophia Ward [id number] DOB: 1933/01/18 DOA: 10/22/2019  PCP: [name], MD  Brief History/Interval Summary: Sophia Ward is a 86 y.o. female with medical history significant of  GERD, CAD s/p MI s/p stent on plavix, DMII, HTN , HLD,RA on MTX, PMR on chronic prednisone, spinal stenosis managed with epidural injections who has had interim history of increase fatigue x 2-3 weeks. Patient states she followed up with her primary for routine visit and on evaluation was found to have drop in hgb to 7.9 from base of 13.4 on 10/02/18. Plan was made for continued monitoring and recheck in 2 weeks. Patient states during that time her fatigued continued and she began to have increasing shortness of breath with exertion. Patient also noted that she has increase loose stools that were dark but states this has been chronic for some time.  Due to ongoing symptoms she was asked to go to the emergency department.  Reason for Visit: Fatigue  Consultants: [facility]  Procedures: None yet  Antibiotics: Anti-infectives (From admission, onward)   None      Subjective/Interval History: Patient states that he is feeling a little stronger after receiving blood transfusion overnight.  Denies any fresh red blood per rectum.  No nausea vomiting.  No abdominal pain.  Last colonoscopy was 2014.  ROS: Denies any shortness of breath currently    Assessment/Plan:  Symptomatic microcytic anemia Significant drop in hemoglobin noted from her usual baseline of 12-13.  Patient was transfused 1 unit of blood due to her symptoms.  She did notice dark to black-colored stool recently.  Denies any fresh red blood per rectum.  She is on Plavix for her heart disease.  Last colonoscopy was in 2014.  No concerning findings were noted per patient.  Gastroenterology has been consulted.  LFTs are normal.  Hemolytic anemia seems less likely.  Hemoglobin improved to  8.3.  Anemia panel shows iron deficiency.  CT abdomen showed diverticulosis.  No other concerning findings noted.  History of recurrent UTI on chronic suppressive treatment Followed by ID.  Stable.  On Macrobid.  No evidence for UTI on UA.  Leukocytosis UA does not show infection.  Chest x-ray is clear.  Continue to monitor for now.  History of GERD Continue PPI  History of coronary artery disease status post MI status post stent Patient on Plavix at home.  This is currently being held due to concern for GI bleed.  Diabetes mellitus type 2, controlled Continue SSI.  HbA1c 7.2.  Also on Levemir.  Essential hypertension Monitor blood pressures closely.  History of rheumatoid arthritis/polymyalgia rheumatica Patient on methotrexate and prednisone at home.  Methotrexate on hold currently.  Prednisone being continued.  History of spinal stenosis Managed with epidural injections  Obesity Estimated body mass index is 30.96 kg/m as calculated from the following:   Height as of this encounter: 5' 2.5" (1.588 m).   Weight as of this encounter: 78 kg.   DVT Prophylaxis: SCDs Code Status: Full code Family Communication: No family at bedside. Disposition Plan:  Status is: Observation  The patient will require care spanning > 2 midnights and should be moved to inpatient because: Ongoing diagnostic testing needed not appropriate for outpatient work up  Dispo: The patient is from: Home              Anticipated d/c is to: Home              Anticipated d/c  date is: 2 days              Patient currently is not medically stable to d/c.       Medications:  Scheduled: . atorvastatin  40 mg Oral QHS  . estradiol  1 Applicatorful Vaginal Daily  . folic acid  2 mg Oral Daily  . insulin aspart  0-9 Units Subcutaneous Q4H  . insulin detemir  14 Units Subcutaneous Daily  . pantoprazole (PROTONIX) IV  40 mg Intravenous Q12H  . predniSONE  4 mg Oral Q breakfast  . sodium chloride flush   3 mL Intravenous Q12H   Continuous: . sodium chloride 75 mL/hr at 10/23/19 0233   [id number] **OR** acetaminophen, albuterol, ondansetron **OR** ondansetron (ZOFRAN) IV   Objective:  Vital Signs  Vitals:   10/23/19 0600 10/23/19 0800 10/23/19 0930 10/23/19 1000  BP: (!) 162/62 (!) 147/64  137/62  Pulse: 98 91 85 82  Resp: 20 (!) 26 (!) 24 (!) 30  Temp:      TempSrc:      SpO2: 95% 93% 95% 95%  Weight:      Height:        Intake/Output Summary (Last 24 hours) at 10/23/2019 1107 Last data filed at 10/23/2019 0231 Gross per 24 hour  Intake 1130 ml  Output --  Net 1130 ml   Filed Weights   10/22/19 1702  Weight: 78 kg    General appearance: Awake alert.  In no distress Resp: Clear to auscultation bilaterally.  Normal effort Cardio: S1-S2 is normal regular.  No S3-S4.  No rubs murmurs or bruit GI: Abdomen is soft.  Nontender nondistended.  Bowel sounds are present normal.  No masses organomegaly Extremities: No edema.  Full range of motion of lower extremities. Neurologic: Alert and oriented x3.  No focal neurological deficits.    Lab Results:  Data Reviewed: I have personally reviewed following labs and imaging studies  CBC: Recent Labs  Lab 10/22/19 1720 10/22/19 2158 10/23/19 0841  WBC 12.4*  --   --   HGB 6.9* 7.2* 8.3*  HCT 24.0*  --  28.1*  MCV 77.4*  --   --   PLT 458*  --   --     Basic Metabolic Panel: Recent Labs  Lab 10/22/19 1720  NA 139  K 4.3  CL 105  CO2 23  GLUCOSE 217*  BUN 18  CREATININE 1.10*  CALCIUM 9.0    GFR: Estimated Creatinine Clearance: 37.3 mL/min (A) (by C-G formula based on SCr of 1.1 mg/dL (H)).  Liver Function Tests: Recent Labs  Lab 10/22/19 1720  AST 14*  ALT 13  ALKPHOS 44  BILITOT 0.6  PROT 6.5  ALBUMIN 3.4*    HbA1C: Recent Labs    10/22/19 2158  HGBA1C 7.2*    CBG: Recent Labs  Lab 10/22/19 2155 10/23/19 0205 10/23/19 0535 10/23/19 0831  GLUCAP 128* 109* 142* 143*      Thyroid Function Tests: Recent Labs    10/22/19 2158  TSH 2.153    Anemia Panel: Recent Labs    10/22/19 2158  VITAMINB12 343  FOLATE >40.0  FERRITIN 8*  TIBC 452*  IRON 9*  RETICCTPCT 2.9    Recent Results (from the past 240 hour(s))  SARS Coronavirus 2 by RT PCR (hospital order, performed in [facility] hospital lab) Nasopharyngeal Nasopharyngeal Swab     Status: None   Collection Time: 10/22/19  8:21 PM   Specimen: Nasopharyngeal Swab  Result Value  Ref Range Status   SARS Coronavirus 2 NEGATIVE NEGATIVE Final    Comment: (NOTE) SARS-CoV-2 target nucleic acids are NOT DETECTED. The SARS-CoV-2 RNA is generally detectable in upper and lower respiratory specimens during the acute phase of infection. The lowest concentration of SARS-CoV-2 viral copies this assay can detect is 250 copies / mL. A negative result does not preclude SARS-CoV-2 infection and should not be used as the sole basis for treatment or other patient management decisions.  A negative result may occur with improper specimen collection / handling, submission of specimen other than nasopharyngeal swab, presence of viral mutation(s) within the areas targeted by this assay, and inadequate number of viral copies (<250 copies / mL). A negative result must be combined with clinical observations, patient history, and epidemiological information. Fact Sheet for Patients:   StrictlyIdeas.no Fact Sheet for Healthcare Providers: BankingDealers.co.za This test is not yet approved or cleared  by the [place] FDA and has been authorized for detection and/or diagnosis of SARS-CoV-2 by FDA under an Emergency Use Authorization (EUA).  This EUA will remain in effect (meaning this test can be used) for the duration of the COVID-19 declaration under Section 564(b)(1) of the Act, 21 U.S.C. section 360bbb-3(b)(1), unless the authorization is terminated or revoked  sooner. Performed at [facility], [place] [address] [place], [place] [address]       Radiology Studies: CT ABDOMEN PELVIS WO CONTRAST  Result Date: 10/22/2019 CLINICAL DATA:  86 year old with symptomatic anemia. EXAM: CT ABDOMEN AND PELVIS WITHOUT CONTRAST TECHNIQUE: Multidetector CT imaging of the abdomen and pelvis was performed following the standard protocol without IV contrast. COMPARISON:  CT 05/04/2012 FINDINGS: Lower chest: Mild cardiomegaly. Mitral annulus calcifications. Small right pleural effusion and basilar atelectasis. Trace left pleural thickening. No pericardial effusion. Lower lobe bronchial thickening. Hepatobiliary: Previous hepatic steatosis is less prominent on the current exam. No evidence of focal hepatic lesion allowing for lack of IV contrast. Punctate granuloma in the left lobe. Gallbladder is unremarkable. Pancreas: No ductal dilatation or inflammation. Spleen: Normal in size without focal abnormality. Adrenals/Urinary Tract: Left adrenal thickening without dominant nodule. Normal right adrenal gland. No hydronephrosis. Minor symmetric perinephric edema. Slight prominence of the renal pelvis ease without frank hydronephrosis, also unchanged. No renal or ureteral calculi. Urinary bladder is partially distended. No bladder wall thickening. Stomach/Bowel: Small hiatal hernia. Stomach is decompressed and not well evaluated. No small bowel obstruction or evidence of inflammation. Surgical clips at the base of the cecum from presumed appendectomy. Appendix not seen. Moderate volume of stool throughout the colon. Mild distal colonic diverticulosis without diverticulitis. No colonic wall thickening. No obvious colonic mass. Vascular/Lymphatic: Small clustered ileocolic nodes measuring up to 6 mm. A few prominent central mesenteric nodes that are not enlarged by size criteria. Aortic and branch atherosclerosis. No aortic aneurysm. Reproductive: Status post hysterectomy. No adnexal  masses. Other: No free air or free fluid. Patchy soft tissue densities in the anterior abdominal wall typical of medication injection sites. No evidence of omental disease or intra-abdominal mass. Musculoskeletal: Multilevel degenerative change in the lumbar spine. No focal bone lesion or acute osseous abnormality. IMPRESSION: 1. No explanation for anemia. 2. Small clustered ileocolic nodes measuring up to 6 mm, nonspecific but likely reactive. 3. Mild distal colonic diverticulosis without diverticulitis. 4. Lung base findings include mild cardiomegaly. Small right pleural effusion and basilar atelectasis. Trace left pleural thickening. Lower lobe bronchial thickening. Aortic Atherosclerosis (ICD10-I70.0). Electronically Signed   By: Aurther Loft.D.  On: 10/22/2019 21:58   DG Chest Port 1 View  Result Date: 10/23/2019 CLINICAL DATA:  Low hemoglobin levels. Cough. EXAM: PORTABLE CHEST 1 VIEW COMPARISON:  11/07/2016 FINDINGS: Aortic atherosclerosis. Moderate cardiac enlargement. No pleural effusion or edema identified. No airspace densities. The visualized osseous structures appear intact. IMPRESSION: No acute cardiopulmonary abnormalities. Aortic Atherosclerosis (ICD10-I70.0). Electronically Signed   By: Kerby Moors M.D.   On: 10/23/2019 09:55       LOS: 0 days   [facility] Pager on [email or web address]  10/23/2019, 11:07 AM

## 2019-10-23 NOTE — Consult Note (Addendum)
Referring Provider: [facility] Primary Care Physician:  [name], MD Primary Gastroenterologist:  [facility] (Dr. Hassell Done)  Reason for Consultation:  Iron deficiency anemia  HPI: Sophia Ward is a 86 y.o. female with history of CAD, MI (s/p stent, on Plavix), polymyalgia rheumatica (on methotrexate and prednisone) presenting with iron deficiency anemia.  Patient has noticed increasing fatigue for the past few weeks, which worsened this week and was associated with shortness of breath on exertion.  This caused her to present to the ED.  She was found to be anemic with hemoglobin of 6.9 yesterday, decreased from 7.9 on 5/3, and 13.4 on 02/12/19.  Baseline hemoglobin is 12.5-13.5.  Patient believes she has had some dark, "almost" black stools over the last week or so.  Denies any hematochezia.  Denies constipation or diarrhea.  Denies any abdominal pain.  She has a history of heartburn for which she takes Protonix 40mg  qd.  She further denies any dysphagia, nausea, vomiting, changes in appetite, unexplained weight loss.  Denies aspirin or NSAID use.  Denies any history of colon cancer or gastrointestinal malignancies.  Colonoscopy and EGD 10/2012 was normal.  Past Medical History:  Diagnosis Date  . Allergic rhinitis   . Anemia   . Arthritis    hands  . BPPV (benign paroxysmal positional vertigo)   . Coronary artery disease   . Coronary atherosclerosis of native coronary artery 01/30/2014  . Diabetes mellitus without complication ([place])   . Essential hypertension, benign 01/30/2014  . GERD (gastroesophageal reflux disease)   . Hypertension   . Mixed hyperlipidemia 01/30/2014  . Myocardial infarction ([place]) 2004   mild MI-no damage- had stents  . Osteopenia   . Post-menopausal   . Rheumatoid arthritis ([place])   . Spinal stenosis   . Stenosing tenosynovitis     Past Surgical History:  Procedure Laterality Date  . ABDOMINAL HYSTERECTOMY    . APPENDECTOMY    . CARDIAC  CATHETERIZATION  2004  . carpel tunnel    . COLONOSCOPY WITH PROPOFOL N/A 10/30/2012   Procedure: COLONOSCOPY WITH PROPOFOL;  Surgeon: Garlan Fair, MD;  Location: [facility];  Service: Endoscopy;  Laterality: N/A;  . CORONARY ANGIOPLASTY  2004  . DILATION AND CURETTAGE OF UTERUS    . ESOPHAGOGASTRODUODENOSCOPY (EGD) WITH PROPOFOL N/A 10/30/2012   Procedure: ESOPHAGOGASTRODUODENOSCOPY (EGD) WITH PROPOFOL;  Surgeon: Garlan Fair, MD;  Location: [facility];  Service: Endoscopy;  Laterality: N/A;  . EYE SURGERY     bilateral cataract with lens implant  . EYE SURGERY    . INJECTION OF SILICONE OIL Left [id number]   Procedure: Injection Of Silicone Oil;  Surgeon: Jalene Mullet, MD;  Location: [place];  Service: Ophthalmology;  Laterality: Left;  . left shouler surgery    . PHOTOCOAGULATION WITH LASER Left 02/12/2019   Procedure: Photocoagulation With Laser;  Surgeon: Jalene Mullet, MD;  Location: [place];  Service: Ophthalmology;  Laterality: Left;  . 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 02/12/2019   Procedure: REPAIR OF COMPLEX TRACTION RETINAL DETACHMENT;  Surgeon: Jalene Mullet, MD;  Location: [place];  Service: Ophthalmology;  Laterality: Left;  . TONSILLECTOMY    . VITRECTOMY 25 GAUGE WITH SCLERAL BUCKLE Left 02/12/2019   Procedure: Vitrectomy 25 Gauge With Scleral Buckle;  Surgeon: Jalene Mullet, MD;  Location: [place];  Service: Ophthalmology;  Laterality: Left;    Prior to Admission medications   Medication Sig Start Date End Date Taking? Authorizing Provider  acetaminophen (TYLENOL) 650 MG CR  tablet Take 1,300 mg by mouth at bedtime.   Yes [provider]  amLODipine (NORVASC) 5 MG tablet Take 2 tablets (10 mg total) by mouth every morning. 04/30/19  Yes Jettie Booze, MD  atorvastatin (LIPITOR) 40 MG tablet Take 1 tablet (40 mg total) by mouth at bedtime. 04/30/19  Yes Jettie Booze, MD  Calcium Carbonate-Vitamin D (CALCIUM + D PO) Take 1 tablet  by mouth every morning.   Yes [provider]  Cholecalciferol (VITAMIN D3) 125 MCG (5000 UT) CAPS Take 5,000 Units by mouth daily.    Yes [provider]  clopidogrel (PLAVIX) 75 MG tablet Take 75 mg by mouth daily.   Yes [provider]  Coenzyme Q10 (COQ10) 200 MG CAPS Take 200 mg by mouth daily.    Yes [provider]  CRANBERRY PO Take 1 tablet by mouth daily.   Yes [provider]  D-Mannose 500 MG CAPS Take 1,000 mg by mouth 2 (two) times daily.   Yes [provider]  estradiol (ESTRACE) 0.1 MG/GM vaginal cream Place 1 Applicatorful vaginally daily.  08/21/19  Yes [provider]  folic acid (FOLVITE) 1 MG tablet Take 2 tablets (2 mg total) by mouth daily. 09/30/19  Yes Ofilia Neas, PA-C  insulin lispro (HUMALOG KWIKPEN) 100 UNIT/ML KiwkPen Inject 6 Units into the skin 3 (three) times daily.    Yes [provider]  LEVEMIR FLEXTOUCH 100 UNIT/ML Pen Inject 14 Units into the skin every morning.  08/25/16  Yes [provider]  methotrexate 2.5 MG tablet TAKE 5 TABLETS BY MOUTH ONCE A WEEK. Caution:Chemotherapy. Protect from light. Patient taking differently: Take 12.5 mg by mouth every Wednesday.  09/30/19  Yes Ofilia Neas, PA-C  metoprolol succinate (TOPROL-XL) 100 MG 24 hr tablet Take 1 tablet (100 mg total) by mouth every morning. 04/30/19  Yes Jettie Booze, MD  nitroGLYCERIN (NITROSTAT) 0.4 MG SL tablet DISSOLVE ONE TABLET UNDER THE TONGUE EVERY 5 MINUTES AS NEEDED FOR CHEST PAIN.  DO NOT EXCEED A TOTAL OF 3 DOSES IN 15 MINUTES Patient taking differently: Place 0.4 mg under the tongue every 5 (five) minutes as needed for chest pain.  04/30/19  Yes Jettie Booze, MD  Omega-3 Fatty Acids (FISH OIL PO) Take 1 capsule by mouth every morning.   Yes [provider]  pantoprazole (PROTONIX) 40 MG tablet Take 40 mg by mouth daily after lunch.   Yes [provider]  predniSONE (DELTASONE) 1  MG tablet Take 4 tablets (4 mg total) by mouth daily with breakfast. 08/19/19  Yes Deveshwar, Abel Presto, MD  ramipril (ALTACE) 10 MG tablet Take 10 mg by mouth 2 (two) times daily.    Yes [provider]  B-D ULTRAFINE III SHORT PEN 31G X 8 MM MISC 1 each by Other route in the morning, at noon, in the evening, and at bedtime.  07/29/19   [provider]  diclofenac sodium (VOLTAREN) 1 % GEL Apply 3 g to 3 large joints up to 3 times daily Patient not taking: Reported on 10/22/2019 10/31/17   Ofilia Neas, PA-C  nitrofurantoin, macrocrystal-monohydrate, (MACROBID) 100 MG capsule Take 100 mg by mouth 2 (two) times daily. 10/04/19   [provider]    Scheduled Meds: . atorvastatin  40 mg Oral QHS  . estradiol  1 Applicatorful Vaginal Daily  . folic acid  2 mg Oral Daily  . insulin aspart  0-9 Units Subcutaneous Q4H  . insulin  detemir  14 Units Subcutaneous Daily  . pantoprazole (PROTONIX) IV  40 mg Intravenous Q12H  . predniSONE  4 mg Oral Q breakfast  . sodium chloride flush  3 mL Intravenous Q12H   Continuous Infusions: PRN Meds:.acetaminophen **OR** acetaminophen, albuterol, ondansetron **OR** ondansetron (ZOFRAN) IV  Allergies as of 10/22/2019 - Review Complete 10/22/2019  Allergen Reaction Noted  . Codeine Nausea And Vomiting 10/15/2012  . Glimepiride Other (See Comments) 10/22/2019  . Jardiance [empagliflozin] Other (See Comments) 10/22/2019  . Metformin and related Diarrhea 10/22/2019  . Penicillins Rash 10/15/2012    Family History  Problem Relation Age of Onset  . CAD Mother   . Heart attack Mother   . CAD Father   . Heart Problems Brother        open heart surgery   . Arthritis Daughter        psoriatic arthritis     Social History   Socioeconomic History  . Marital status: Married    Spouse name: Not on file  . Number of children: Not on file  . Years of education: Not on file  . Highest education level: Not on file  Occupational History  .  Not on file  Tobacco Use  . Smoking status: Never Smoker  . Smokeless tobacco: Never Used  Substance and Sexual Activity  . Alcohol use: No  . Drug use: No  . Sexual activity: Not on file  Other Topics Concern  . Not on file  Social History Narrative  . Not on file   Social Determinants of Health   Financial Resource Strain:   . Difficulty of Paying Living Expenses:   Food Insecurity:   . Worried About Charity fundraiser in the Last Year:   . Arboriculturist in the Last Year:   Transportation Needs:   . Film/video editor (Medical):   Marland Kitchen Lack of Transportation (Non-Medical):   Physical Activity:   . Days of Exercise per Week:   . Minutes of Exercise per Session:   Stress:   . Feeling of Stress :   Social Connections:   . Frequency of Communication with Friends and Family:   . Frequency of Social Gatherings with Friends and Family:   . Attends Religious Services:   . Active Member of Clubs or Organizations:   . Attends Archivist Meetings:   Marland Kitchen Marital Status:   Intimate Partner Violence:   . Fear of Current or Ex-Partner:   . Emotionally Abused:   Marland Kitchen Physically Abused:   . Sexually Abused:     Review of Systems: Review of Systems  Constitutional: Positive for malaise/fatigue. Negative for chills, fever and weight loss.  HENT: Negative for nosebleeds and sore throat.   Eyes: Negative for blurred vision and pain.  Respiratory: Positive for shortness of breath. Negative for cough.   Cardiovascular: Negative for chest pain and palpitations.  Gastrointestinal: Positive for heartburn and melena. Negative for abdominal pain, blood in stool, constipation, diarrhea, nausea and vomiting.  Genitourinary: Negative for dysuria and hematuria.  Musculoskeletal: Negative for back pain and neck pain.  Skin: Negative for itching and rash.  Neurological: Negative for seizures and loss of consciousness.  Endo/Heme/Allergies: Negative for polydipsia. Does not bruise/bleed  easily.  Psychiatric/Behavioral: Negative for substance abuse. The patient is nervous/anxious.      Physical Exam: Vital signs: Vitals:   10/23/19 1100 10/23/19 1200  BP: (!) 132/55   Pulse: 82 89  Resp: 18 (!) 26  Temp:  SpO2: 94% 95%     Physical Exam  Constitutional: She is oriented to person, place, and time. She appears well-developed and well-nourished. No distress.  HENT:  Head: Normocephalic and atraumatic.  Eyes: EOM are normal. No scleral icterus.  conjunctival pallor  Cardiovascular: Normal rate, regular rhythm and normal heart sounds.  Pulmonary/Chest: Effort normal. No respiratory distress.  Abdominal: Soft. Bowel sounds are normal. She exhibits no distension and no mass. There is no abdominal tenderness. There is no rebound and no guarding.  Musculoskeletal:        General: No deformity or edema.     Cervical back: Normal range of motion.  Neurological: She is alert and oriented to person, place, and time.  Skin: Skin is warm and dry.  Psychiatric: She has a normal mood and affect. Her behavior is normal.     GI:  Lab Results: Recent Labs    10/22/19 1720 10/22/19 2158 10/23/19 0841  WBC 12.4*  --   --   HGB 6.9* 7.2* 8.3*  HCT 24.0*  --  28.1*  PLT 458*  --   --    BMET Recent Labs    10/22/19 1720  NA 139  K 4.3  CL 105  CO2 23  GLUCOSE 217*  BUN 18  CREATININE 1.10*  CALCIUM 9.0   LFT Recent Labs    10/22/19 1720  PROT 6.5  ALBUMIN 3.4*  AST 14*  ALT 13  ALKPHOS 44  BILITOT 0.6   PT/INR No results for input(s): LABPROT, INR in the last 72 hours.   Studies/Results: CT ABDOMEN PELVIS WO CONTRAST  Result Date: 10/22/2019 CLINICAL DATA:  86 year old with symptomatic anemia. EXAM: CT ABDOMEN AND PELVIS WITHOUT CONTRAST TECHNIQUE: Multidetector CT imaging of the abdomen and pelvis was performed following the standard protocol without IV contrast. COMPARISON:  CT 05/04/2012 FINDINGS: Lower chest: Mild cardiomegaly. Mitral  annulus calcifications. Small right pleural effusion and basilar atelectasis. Trace left pleural thickening. No pericardial effusion. Lower lobe bronchial thickening. Hepatobiliary: Previous hepatic steatosis is less prominent on the current exam. No evidence of focal hepatic lesion allowing for lack of IV contrast. Punctate granuloma in the left lobe. Gallbladder is unremarkable. Pancreas: No ductal dilatation or inflammation. Spleen: Normal in size without focal abnormality. Adrenals/Urinary Tract: Left adrenal thickening without dominant nodule. Normal right adrenal gland. No hydronephrosis. Minor symmetric perinephric edema. Slight prominence of the renal pelvis ease without frank hydronephrosis, also unchanged. No renal or ureteral calculi. Urinary bladder is partially distended. No bladder wall thickening. Stomach/Bowel: Small hiatal hernia. Stomach is decompressed and not well evaluated. No small bowel obstruction or evidence of inflammation. Surgical clips at the base of the cecum from presumed appendectomy. Appendix not seen. Moderate volume of stool throughout the colon. Mild distal colonic diverticulosis without diverticulitis. No colonic wall thickening. No obvious colonic mass. Vascular/Lymphatic: Small clustered ileocolic nodes measuring up to 6 mm. A few prominent central mesenteric nodes that are not enlarged by size criteria. Aortic and branch atherosclerosis. No aortic aneurysm. Reproductive: Status post hysterectomy. No adnexal masses. Other: No free air or free fluid. Patchy soft tissue densities in the anterior abdominal wall typical of medication injection sites. No evidence of omental disease or intra-abdominal mass. Musculoskeletal: Multilevel degenerative change in the lumbar spine. No focal bone lesion or acute osseous abnormality. IMPRESSION: 1. No explanation for anemia. 2. Small clustered ileocolic nodes measuring up to 6 mm, nonspecific but likely reactive. 3. Mild distal colonic  diverticulosis without diverticulitis. 4. Lung base findings  include mild cardiomegaly. Small right pleural effusion and basilar atelectasis. Trace left pleural thickening. Lower lobe bronchial thickening. Aortic Atherosclerosis (ICD10-I70.0). Electronically Signed   By: Keith Rake M.D.   On: 10/22/2019 21:58   DG Chest Port 1 View  Result Date: 10/23/2019 CLINICAL DATA:  Low hemoglobin levels. Cough. EXAM: PORTABLE CHEST 1 VIEW COMPARISON:  11/07/2016 FINDINGS: Aortic atherosclerosis. Moderate cardiac enlargement. No pleural effusion or edema identified. No airspace densities. The visualized osseous structures appear intact. IMPRESSION: No acute cardiopulmonary abnormalities. Aortic Atherosclerosis (ICD10-I70.0). Electronically Signed   By: Kerby Moors M.D.   On: 10/23/2019 09:55    Impression: Iron deficiency anemia in the setting of Plavix use (last dose of Plavix 5/25).  Hgb 6.9 on arrival, decreased from baseline 12.5-13.5. -Ferritin decreased (8), iron low (9), TIBC elevated (452), iron saturation decreased (25): all consistent with iron-deficiency anemia -Folate and B12 normal -Mildly elevated LDH, less likely hemolytic anemia -BUN 18/ Cr 1.10 as of 5/25 -Possible melenic stools -FOBT negative  Plan: Advised patient that the best plan would be to proceed with EGD and colonoscopy to determine if she has any gastrointestinal bleeding which will aid in clearance to resume Plavix.  Patient states she is "leery" of having any procedures done, as her husband (similar age) was told he did not need to continue colon cancer screening as there was increased risks due to thinning of the tissue.  Thoroughly explained to patient that her situation is different from a screening colonoscopy, and that these procedures would be diagnostic in the setting of iron deficiency anemia.  Explained that there is a risk of going back on Plavix with possible GI bleed.  Recommended that we proceed with the EGD  and colonoscopy; however, patient is adamant that she does not want colonoscopy.  Advised we could consider proceeding with EGD alone for initial evaluation.  I thoroughly educated the patient on the risks (to including bleeding, perforation, infection, anesthesia) and benefits of the procedure and in this situation I do believe the benefits outweigh the risks.  Patient states she will think about her options and let us know this afternoon if she wants to proceed.  Continue to monitor Hgb and transfuse as needed to maintain Hgb >7-8.  Continue IV Protonix BID.  Continue to hold Plavix.  Addendum: Patient gave verbal consent to Dr. Alessandra Bevels to proceed with EGD tomorrow.  She wishes to hold off on colonoscopy.  [facility] will follow.   LOS: 0 days   Salley Slaughter  PA-C 10/23/2019, 12:13 PM  Contact #  [phone]

## 2019-10-23 NOTE — ED Notes (Signed)
GI at bedside

## 2019-10-24 ENCOUNTER — Encounter (HOSPITAL_COMMUNITY)
Admission: EM | Disposition: A | Discharge status: Home / Self Care | Payer: Self-pay | Source: Home / Self Care | Attending: Internal Medicine

## 2019-10-24 ENCOUNTER — Encounter (HOSPITAL_COMMUNITY): Payer: Self-pay | Admitting: Anesthesiology

## 2019-10-24 ENCOUNTER — Inpatient Hospital Stay (HOSPITAL_COMMUNITY): Payer: Medicare Other

## 2019-10-24 DIAGNOSIS — I361 Nonrheumatic tricuspid (valve) insufficiency: Secondary | ICD-10-CM

## 2019-10-24 DIAGNOSIS — I4891 Unspecified atrial fibrillation: Secondary | ICD-10-CM

## 2019-10-24 DIAGNOSIS — I350 Nonrheumatic aortic (valve) stenosis: Secondary | ICD-10-CM

## 2019-10-24 DIAGNOSIS — E876 Hypokalemia: Secondary | ICD-10-CM

## 2019-10-24 LAB — BASIC METABOLIC PANEL
Anion gap: 11 (ref 5–15)
Anion gap: 10 (ref 5–15)
BUN: 7 mg/dL — ABNORMAL LOW (ref 8–23)
BUN: 9 mg/dL (ref 8–23)
CO2: 22 mmol/L (ref 22–32)
CO2: 22 mmol/L (ref 22–32)
Calcium: 8.9 mg/dL (ref 8.9–10.3)
Calcium: 8.3 mg/dL — ABNORMAL LOW (ref 8.9–10.3)
Chloride: 105 mmol/L (ref 98–111)
Chloride: 105 mmol/L (ref 98–111)
Creatinine, Ser: 0.95 mg/dL (ref 0.44–1.00)
Creatinine, Ser: 0.93 mg/dL (ref 0.44–1.00)
GFR calc Af Amer: >60 mL/min (ref >60)
GFR calc Af Amer: >60 mL/min (ref >60)
GFR calc non Af Amer: 55 mL/min — ABNORMAL LOW (ref >60)
GFR calc non Af Amer: 56 mL/min — ABNORMAL LOW (ref >60)
Glucose, Bld: 126 mg/dL — ABNORMAL HIGH (ref 70–99)
Glucose, Bld: 152 mg/dL — ABNORMAL HIGH (ref 70–99)
Potassium: 2.9 mmol/L — ABNORMAL LOW (ref 3.5–5.1)
Potassium: 4.4 mmol/L (ref 3.5–5.1)
Sodium: 138 mmol/L (ref 135–145)
Sodium: 137 mmol/L (ref 135–145)

## 2019-10-24 LAB — GLUCOSE, CAPILLARY
Glucose-Capillary: 115 mg/dL — ABNORMAL HIGH (ref 70–99)
Glucose-Capillary: 117 mg/dL — ABNORMAL HIGH (ref 70–99)
Glucose-Capillary: 135 mg/dL — ABNORMAL HIGH (ref 70–99)
Glucose-Capillary: 137 mg/dL — ABNORMAL HIGH (ref 70–99)
Glucose-Capillary: 148 mg/dL — ABNORMAL HIGH (ref 70–99)
Glucose-Capillary: 242 mg/dL — ABNORMAL HIGH (ref 70–99)

## 2019-10-24 LAB — CBC
HCT: 28.1 % — ABNORMAL LOW (ref 36.0–46.0)
Hemoglobin: 8.1 g/dL — ABNORMAL LOW (ref 12.0–15.0)
MCH: 22.6 pg — ABNORMAL LOW (ref 26.0–34.0)
MCHC: 28.8 g/dL — ABNORMAL LOW (ref 30.0–36.0)
MCV: 78.3 fL — ABNORMAL LOW (ref 80.0–100.0)
Platelets: 431 10*3/uL — ABNORMAL HIGH (ref 150–400)
RBC: 3.59 MIL/uL — ABNORMAL LOW (ref 3.87–5.11)
RDW: 19.4 % — ABNORMAL HIGH (ref 11.5–15.5)
WBC: 18.4 10*3/uL — ABNORMAL HIGH (ref 4.0–10.5)
nRBC: 0.1 % (ref 0.0–0.2)

## 2019-10-24 LAB — ECHOCARDIOGRAM COMPLETE
Height: 62 in
Weight: 2666.68 oz

## 2019-10-24 LAB — MAGNESIUM: Magnesium: 1.5 mg/dL — ABNORMAL LOW (ref 1.7–2.4)

## 2019-10-24 LAB — GLUCOSE 6 PHOSPHATE DEHYDROGENASE
G6PDH: 21.4 U/g{Hb} — ABNORMAL HIGH (ref 4.8–15.7)
Hemoglobin: 7.2 g/dL — ABNORMAL LOW (ref 11.1–15.9)

## 2019-10-24 SURGERY — CANCELLED PROCEDURE

## 2019-10-24 MED ORDER — POTASSIUM CHLORIDE 10 MEQ/100ML IV SOLN
10.0000 meq | INTRAVENOUS | Status: AC
  Administered 2019-10-24 (×4): 10 meq via INTRAVENOUS
  Filled 2019-10-24 (×4): qty 100

## 2019-10-24 MED ORDER — METOPROLOL TARTRATE 5 MG/5ML IV SOLN
5.0000 mg | INTRAVENOUS | Status: AC | PRN
  Administered 2019-10-24: 5 mg via INTRAVENOUS
  Filled 2019-10-24: qty 5

## 2019-10-24 MED ORDER — METOPROLOL TARTRATE 5 MG/5ML IV SOLN
2.5000 mg | INTRAVENOUS | Status: AC | PRN
  Administered 2019-10-24: 2.5 mg via INTRAVENOUS
  Filled 2019-10-24: qty 5

## 2019-10-24 MED ORDER — METOPROLOL TARTRATE 50 MG PO TABS
50.0000 mg | ORAL_TABLET | Freq: Two times a day (BID) | ORAL | Status: DC
  Administered 2019-10-24 – 2019-10-27 (×7): 50 mg via ORAL
  Filled 2019-10-24 (×7): qty 1

## 2019-10-24 MED ORDER — POTASSIUM CHLORIDE CRYS ER 20 MEQ PO TBCR
40.0000 meq | EXTENDED_RELEASE_TABLET | Freq: Once | ORAL | Status: AC
  Administered 2019-10-24: 40 meq via ORAL
  Filled 2019-10-24: qty 2

## 2019-10-24 MED ORDER — METOPROLOL TARTRATE 5 MG/5ML IV SOLN
2.5000 mg | Freq: Four times a day (QID) | INTRAVENOUS | Status: DC | PRN
  Administered 2019-10-24: 2.5 mg via INTRAVENOUS
  Filled 2019-10-24: qty 5

## 2019-10-24 MED ORDER — SODIUM CHLORIDE 0.9 % IV BOLUS
250.0000 mL | Freq: Once | INTRAVENOUS | Status: AC
  Administered 2019-10-24: 250 mL via INTRAVENOUS

## 2019-10-24 MED ORDER — MAGNESIUM SULFATE 2 GM/50ML IV SOLN
2.0000 g | Freq: Once | INTRAVENOUS | Status: AC
  Administered 2019-10-24: 2 g via INTRAVENOUS
  Filled 2019-10-24: qty 50

## 2019-10-24 MED ORDER — NITROFURANTOIN MONOHYD MACRO 100 MG PO CAPS
100.0000 mg | ORAL_CAPSULE | Freq: Two times a day (BID) | ORAL | Status: DC
  Administered 2019-10-25: 100 mg via ORAL
  Filled 2019-10-24 (×3): qty 1

## 2019-10-24 NOTE — Progress Notes (Signed)
  Echocardiogram 2D Echocardiogram has been performed.  [name] M 10/24/2019, 3:32 PM

## 2019-10-24 NOTE — Progress Notes (Signed)
Patient's HR still in the 100's-130's. Pt denies any c/o. Denny,NP text paged. Will continue to monitor.

## 2019-10-24 NOTE — Plan of Care (Signed)
  Problem: Activity: Goal: Risk for activity intolerance will decrease Outcome: Progressing   

## 2019-10-24 NOTE — Progress Notes (Signed)
[facility]enter[facility] Progress Note  Sophia Ward 86 y.o. 04/15/1933  CC:  Iron deficiency anemia     Subjective: Patient feels well this morning.  Denies abdominal pain, nausea, or vomiting.  Last bowel movement was yesterday.  Denies melena or hematochezia.  EGD this morning was cancelled due to development of A Fib with RVR and hypokalemia (K 2.9).  ROS : Review of Systems  Respiratory: Negative for cough and shortness of breath.   Cardiovascular: Positive for palpitations. Negative for chest pain.  Gastrointestinal: Negative for abdominal pain, blood in stool, constipation, diarrhea, heartburn, melena, nausea and vomiting.    Objective: Vital signs in last 24 hours: Vitals:   10/24/19 0622 10/24/19 0726  BP: (!) 107/56 100/62  Pulse: (!) 124 (!) 112  Resp: 18 19  Temp: 98.9 F (37.2 C) 98.8 F (37.1 C)  SpO2: 96% 99%    Physical Exam:  General:  Alert, oriented, cooperative, no distress, appears stated age  Head:  Normocephalic, without obvious abnormality, atraumatic  Eyes:  Mild conjunctival pallor, EOMs intact  Lungs:   Clear to auscultation bilaterally, respirations unlabored  Heart:  Tachycardic with irregularly irregular rhythm (A fib)  Abdomen:   Soft, non-tender, nondistended, normoactive bowel sounds, no guarding or peritoneal signs  Extremities: Extremities normal, atraumatic, no  edema  Pulses: 2+ and symmetric    Lab Results: Recent Labs    10/22/19 1720 10/24/19 0326  NA 139 138  K 4.3 2.9*  CL 105 105  CO2 23 22  GLUCOSE 217* 126*  BUN 18 7*  CREATININE 1.10* 0.95  CALCIUM 9.0 8.9   Recent Labs    10/22/19 1720  AST 14*  ALT 13  ALKPHOS 44  BILITOT 0.6  PROT 6.5  ALBUMIN 3.4*   Recent Labs    10/22/19 1720 10/22/19 2158 10/23/19 1657 10/24/19 0326  WBC 12.4*  --   --  18.4*  HGB 6.9*   < > 8.0* 8.1*  HCT 24.0*   < > 27.0* 28.1*  MCV 77.4*  --   --  78.3*  PLT 458*  --   --  431*   < > = values in this interval not  displayed.   No results for input(s): LABPROT, INR in the last 72 hours.  Impression: Iron deficiency anemia in the setting of Plavix use (last dose of Plavix 5/25).  -Hgb 8.1, improved from 6.9 after 1u pRBCs 5/26 -BUN 7/ Cr 0.95, improved from BUN 18/ Cr 1.10 on 5/25  Hypokalemia, K+ of 2.9 this morning, receiving IV potassium  A fib with RVR: patient reports she hasn't been taking Altace at home but has been compliant with metoprolol and Plavix  Plan: EGD tomorrow if heart rate is controlled and hypokalemia improves.  I thoroughly educated the patient on the risks (to including bleeding, perforation, infection, anesthesia) and benefits of the procedure and she gave verbal consent to proceed with the EGD.  Continue to monitor Hgb and transfuse as needed to maintain Hgb >8.  Continue IV Protonix BID.  Continue to hold Plavix.  [facility] will follow.   Salley Slaughter PA-C 10/24/2019, 9:45 AM  Contact #  [phone]

## 2019-10-24 NOTE — Progress Notes (Signed)
[facility] PROGRESS NOTE   CYTLALI BIZZELL [id number] DOB: 1933/02/25 DOA: 10/22/2019  PCP: [name], MD  Brief History/Interval Summary: Sophia Ward is a 86 y.o. female with medical history significant of  GERD, CAD s/p MI s/p stent on plavix, DMII, HTN , HLD,RA on MTX, PMR on chronic prednisone, spinal stenosis managed with epidural injections who has had interim history of increase fatigue x 2-3 weeks. Patient states she followed up with her primary for routine visit and on evaluation was found to have drop in hgb to 7.9 from base of 13.4 on 10/02/18. Plan was made for continued monitoring and recheck in 2 weeks. Patient states during that time her fatigued continued and she began to have increasing shortness of breath with exertion. Patient also noted that she has increase loose stools that were dark but states this has been chronic for some time.  Due to ongoing symptoms she was asked to go to the emergency department.  Consultants: [facility]  Procedures: None yet  Antibiotics: Anti-infectives (From admission, onward)   None      Subjective/Interval History: Overnight events noted.  Patient went into atrial fibrillation with RVR.  She denies any chest pain.  She tells me that she has noticed palpitations every so often for the past 2 months.  Denies any dizziness or lightheadedness.  She last saw her cardiologist in December.  Had a brown bowel movement yesterday.  No other complaints offered.  ROS: Denies any shortness of breath.    Assessment/Plan:  Symptomatic microcytic anemia Significant drop in hemoglobin noted from her usual baseline of 12-13.  Patient was transfused 1 unit of blood due to her symptoms.  She did notice dark to black-colored stool recently.  Denies any fresh red blood per rectum.  She is on Plavix for her heart disease.  Last colonoscopy was in 2014.  No concerning findings were noted per patient.  Anemia panel shows  iron deficiency.  CT abdomen showed diverticulosis.  No other concerning findings noted. Patient seen by gastroenterology.  Plan was for upper endoscopy today.  However due to electrolyte abnormalities and atrial fibrillation this has been postponed. Hemoglobin is stable.  Had a brown bowel movement yesterday.      Atrial fibrillation with RVR This is a new diagnosis for her.  She has had palpitations on and off for the past 2 months.  TSH was 2.15.  No recent echocardiogram in EMR.  This will be ordered.  She is noted to be hypokalemic.  Potassium will be repleted.  Recheck labs later today.  She was on metoprolol at home.  This will be resumed.  Monitor on telemetry.  Dr. Irish Lack is her cardiologist.  No need to involve him at this time.  Risks of thromboembolism also discussed with patient and her family.  Anticoagulation will also depend on GI work-up.  Hypokalemia Will be repleted.  Check magnesium level.  History of recurrent UTI on chronic suppressive treatment Followed by ID.  Stable.  On Macrobid.  No evidence for UTI on UA.  Leukocytosis WBC noted to be 18.4 today.  Reason for this is not entirely clear.  She is afebrile.  Chest x-ray is unremarkable.  UA does not show any infection.  No other signs of infection noted.  Continue to monitor for now off of antibiotics.    History of GERD Continue PPI  History of coronary artery disease status post MI status post stent Patient on Plavix at home.  This  is currently being held due to concern for GI bleed.   Diabetes mellitus type 2, controlled Continue SSI.  HbA1c 7.2.  Also on Levemir.  Essential hypertension Blood pressure stable for the most part.  Continue to monitor.  History of rheumatoid arthritis/polymyalgia rheumatica Patient on methotrexate and prednisone at home.  Methotrexate on hold currently.  Prednisone being continued.  History of spinal stenosis Managed with epidural injections  Obesity Estimated body mass  index is 30.48 kg/m as calculated from the following:   Height as of this encounter: 5\' 2"  (1.575 m).   Weight as of this encounter: 75.6 kg.   DVT Prophylaxis: SCDs Code Status: Full code Family Communication: Discussed with the patient, her husband and daughter Disposition Plan:  Status is: Inpatient  The patient will require care spanning > 2 midnights and should be moved to inpatient because: Ongoing diagnostic testing needed not appropriate for outpatient work up  [place]: The patient is from: Home              Anticipated d/c is to: Home              Anticipated d/c date is: 2 days              Patient currently is not medically stable to d/c.       Medications:  Scheduled: . atorvastatin  40 mg Oral QHS  . estradiol  1 Applicatorful Vaginal Daily  . folic acid  2 mg Oral Daily  . insulin aspart  0-9 Units Subcutaneous Q4H  . insulin detemir  14 Units Subcutaneous Daily  . metoprolol tartrate  50 mg Oral BID  . pantoprazole (PROTONIX) IV  40 mg Intravenous Q12H  . predniSONE  4 mg Oral Q breakfast  . sodium chloride flush  3 mL Intravenous Q12H   Continuous: . sodium chloride    . potassium chloride 10 mEq (10/24/19 0956)   [id number] **OR** acetaminophen, albuterol, metoprolol tartrate, ondansetron **OR** ondansetron (ZOFRAN) IV   Objective:  Vital Signs  Vitals:   10/24/19 0532 10/24/19 0622 10/24/19 0726 10/24/19 0948  BP: (!) 111/52 (!) 107/56 100/62 130/62  Pulse: 76 (!) 124 (!) 112 (!) 118  Resp: 16 18 19 14   Temp: 99.1 F (37.3 C) 98.9 F (37.2 C) 98.8 F (37.1 C) 98.3 F (36.8 C)  TempSrc: Oral Oral Oral Oral  SpO2: 96% 96% 99% 96%  Weight:      Height:        Intake/Output Summary (Last 24 hours) at 10/24/2019 1046 Last data filed at 10/24/2019 0654 Gross per 24 hour  Intake 1263.3 ml  Output 900 ml  Net 363.3 ml   Filed Weights   10/22/19 1702 10/23/19 1252  Weight: 78 kg 75.6 kg    General appearance: Awake alert.  In no  distress Resp: Clear to auscultation bilaterally.  Normal effort Cardio: S1-S2 is irregularly irregular.  Systolic murmur appreciated over the precordium. GI: Abdomen is soft.  Nontender nondistended.  Bowel sounds are present normal.  No masses organomegaly Extremities: No edema.  Full range of motion of lower extremities. Neurologic: Alert and oriented x3.  No focal neurological deficits.     Lab Results:  Data Reviewed: I have personally reviewed following labs and imaging studies  CBC: Recent Labs  Lab 10/22/19 1720 10/22/19 2158 10/23/19 0841 10/23/19 1657 10/24/19 0326  WBC 12.4*  --   --   --  18.4*  HGB 6.9* 7.2* 8.3* 8.0* 8.1*  HCT 24.0*  --  28.1* 27.0* 28.1*  MCV 77.4*  --   --   --  78.3*  PLT 458*  --   --   --  431*    Basic Metabolic Panel: Recent Labs  Lab 10/22/19 1720 10/24/19 0326  NA 139 138  K 4.3 2.9*  CL 105 105  CO2 23 22  GLUCOSE 217* 126*  BUN 18 7*  CREATININE 1.10* 0.95  CALCIUM 9.0 8.9    GFR: Estimated Creatinine Clearance: 42 mL/min (by C-G formula based on SCr of 0.95 mg/dL).  Liver Function Tests: Recent Labs  Lab 10/22/19 1720  AST 14*  ALT 13  ALKPHOS 44  BILITOT 0.6  PROT 6.5  ALBUMIN 3.4*    HbA1C: Recent Labs    10/22/19 2158  HGBA1C 7.2*    CBG: Recent Labs  Lab 10/23/19 1630 10/23/19 2033 10/23/19 2343 10/24/19 0326 10/24/19 0640  GLUCAP 270* 120* 102* 135* 117*     Thyroid Function Tests: Recent Labs    10/22/19 2158  TSH 2.153    Anemia Panel: Recent Labs    10/22/19 2158  VITAMINB12 343  FOLATE 54.6  FERRITIN 8*  TIBC 452*  IRON 9*  RETICCTPCT 2.9    Recent Results (from the past 240 hour(s))  SARS Coronavirus 2 by RT PCR (hospital order, performed in [facility] hospital lab) Nasopharyngeal Nasopharyngeal Swab     Status: None   Collection Time: 10/22/19  8:21 PM   Specimen: Nasopharyngeal Swab  Result Value Ref Range Status   SARS Coronavirus 2 NEGATIVE NEGATIVE Final     Comment: (NOTE) SARS-CoV-2 target nucleic acids are NOT DETECTED. The SARS-CoV-2 RNA is generally detectable in upper and lower respiratory specimens during the acute phase of infection. The lowest concentration of SARS-CoV-2 viral copies this assay can detect is 250 copies / mL. A negative result does not preclude SARS-CoV-2 infection and should not be used as the sole basis for treatment or other patient management decisions.  A negative result may occur with improper specimen collection / handling, submission of specimen other than nasopharyngeal swab, presence of viral mutation(s) within the areas targeted by this assay, and inadequate number of viral copies (<250 copies / mL). A negative result must be combined with clinical observations, patient history, and epidemiological information. Fact Sheet for Patients:   StrictlyIdeas.no Fact Sheet for Healthcare Providers: BankingDealers.co.za This test is not yet approved or cleared  by the [place] FDA and has been authorized for detection and/or diagnosis of SARS-CoV-2 by FDA under an Emergency Use Authorization (EUA).  This EUA will remain in effect (meaning this test can be used) for the duration of the COVID-19 declaration under Section 564(b)(1) of the Act, 21 U.S.C. section 360bbb-3(b)(1), unless the authorization is terminated or revoked sooner. Performed at [facility], [place] [address] [place], [place] [address]   Urine culture     Status: None (Preliminary result)   Collection Time: 10/23/19  8:24 AM   Specimen: Urine, Random  Result Value Ref Range Status   Specimen Description URINE, RANDOM  Final   Special Requests NONE  Final   Culture   Final    CULTURE REINCUBATED FOR BETTER GROWTH Performed at [facility], [place] [address], [place], [place] [address]    Report Status PENDING  Incomplete      Radiology Studies: CT ABDOMEN PELVIS WO  CONTRAST  Result Date: 10/22/2019 CLINICAL DATA:  86 year old with symptomatic anemia. EXAM: CT ABDOMEN AND PELVIS WITHOUT  CONTRAST TECHNIQUE: Multidetector CT imaging of the abdomen and pelvis was performed following the standard protocol without IV contrast. COMPARISON:  CT 05/04/2012 FINDINGS: Lower chest: Mild cardiomegaly. Mitral annulus calcifications. Small right pleural effusion and basilar atelectasis. Trace left pleural thickening. No pericardial effusion. Lower lobe bronchial thickening. Hepatobiliary: Previous hepatic steatosis is less prominent on the current exam. No evidence of focal hepatic lesion allowing for lack of IV contrast. Punctate granuloma in the left lobe. Gallbladder is unremarkable. Pancreas: No ductal dilatation or inflammation. Spleen: Normal in size without focal abnormality. Adrenals/Urinary Tract: Left adrenal thickening without dominant nodule. Normal right adrenal gland. No hydronephrosis. Minor symmetric perinephric edema. Slight prominence of the renal pelvis ease without frank hydronephrosis, also unchanged. No renal or ureteral calculi. Urinary bladder is partially distended. No bladder wall thickening. Stomach/Bowel: Small hiatal hernia. Stomach is decompressed and not well evaluated. No small bowel obstruction or evidence of inflammation. Surgical clips at the base of the cecum from presumed appendectomy. Appendix not seen. Moderate volume of stool throughout the colon. Mild distal colonic diverticulosis without diverticulitis. No colonic wall thickening. No obvious colonic mass. Vascular/Lymphatic: Small clustered ileocolic nodes measuring up to 6 mm. A few prominent central mesenteric nodes that are not enlarged by size criteria. Aortic and branch atherosclerosis. No aortic aneurysm. Reproductive: Status post hysterectomy. No adnexal masses. Other: No free air or free fluid. Patchy soft tissue densities in the anterior abdominal wall typical of medication injection  sites. No evidence of omental disease or intra-abdominal mass. Musculoskeletal: Multilevel degenerative change in the lumbar spine. No focal bone lesion or acute osseous abnormality. IMPRESSION: 1. No explanation for anemia. 2. Small clustered ileocolic nodes measuring up to 6 mm, nonspecific but likely reactive. 3. Mild distal colonic diverticulosis without diverticulitis. 4. Lung base findings include mild cardiomegaly. Small right pleural effusion and basilar atelectasis. Trace left pleural thickening. Lower lobe bronchial thickening. Aortic Atherosclerosis (ICD10-I70.0). Electronically Signed   By: Keith Rake M.D.   On: 10/22/2019 21:58   DG Chest Port 1 View  Result Date: 10/23/2019 CLINICAL DATA:  Low hemoglobin levels. Cough. EXAM: PORTABLE CHEST 1 VIEW COMPARISON:  11/07/2016 FINDINGS: Aortic atherosclerosis. Moderate cardiac enlargement. No pleural effusion or edema identified. No airspace densities. The visualized osseous structures appear intact. IMPRESSION: No acute cardiopulmonary abnormalities. Aortic Atherosclerosis (ICD10-I70.0). Electronically Signed   By: Kerby Moors M.D.   On: 10/23/2019 09:55       LOS: 1 day   [facility] Pager on [email or web address]  10/24/2019, 10:46 AM

## 2019-10-24 NOTE — Anesthesia Preprocedure Evaluation (Deleted)
Anesthesia Evaluation    Reviewed: Allergy & Precautions, Patient's Chart, lab work & pertinent test results  Airway        Dental   Pulmonary neg pulmonary ROS,           Cardiovascular hypertension, Pt. on medications + CAD and + Past MI       Neuro/Psych negative neurological ROS  negative psych ROS   GI/Hepatic Neg liver ROS, GERD  Medicated,  Endo/Other  diabetes, Type 2, Insulin Dependent  Renal/GU negative Renal ROS     Musculoskeletal  (+) Arthritis ,   Abdominal   Peds  Hematology negative hematology ROS (+) anemia ,   Anesthesia Other Findings   Reproductive/Obstetrics                             Anesthesia Physical Anesthesia Plan  ASA: III  Anesthesia Plan: MAC   Post-op Pain Management:    Induction: Intravenous  PONV Risk Score and Plan: 0 and Propofol infusion  Airway Management Planned: Natural Airway and Nasal Cannula  Additional Equipment: None  Intra-op Plan:   Post-operative Plan:   Informed Consent:   Plan Discussed with: CRNA  Anesthesia Plan Comments: (Cancelled by proceduralist - Afib, Low K+)       Anesthesia Quick Evaluation

## 2019-10-24 NOTE — Progress Notes (Signed)
Informed by CCMD that patient converted to Atrial fib from NSR. HR 115-140's. Patient was asleep but easily arouses when RN went to check. Patient denies any c/o. Denny,NP text paged. Awaiting for orders.Will continue to monitor. Greenly Rarick, Wonda Cheng, Therapist, sports

## 2019-10-24 NOTE — Progress Notes (Signed)
10/24/19 0655 This RN found that  patient's K+ level 2.9, RN did not receive any call for panic lab result. Denny,NP text paged. NP came to unit and gave order for potassium  10 meq IV x4. Incoming RN made aware.

## 2019-10-24 NOTE — Plan of Care (Signed)
  Problem: Clinical Measurements: Goal: Cardiovascular complication will be avoided Outcome: Progressing   

## 2019-10-24 NOTE — H&P (View-Only) (Signed)
[facility] Progress Note  Sophia Ward 86 y.o. [birth year]  CC:  Iron deficiency anemia     Subjective: Patient feels well this morning.  Denies abdominal pain, nausea, or vomiting.  Last bowel movement was yesterday.  Denies melena or hematochezia.  EGD this morning was cancelled due to development of A Fib with RVR and hypokalemia (K 2.9).  ROS : Review of Systems  Respiratory: Negative for cough and shortness of breath.   Cardiovascular: Positive for palpitations. Negative for chest pain.  Gastrointestinal: Negative for abdominal pain, blood in stool, constipation, diarrhea, heartburn, melena, nausea and vomiting.    Objective: Vital signs in last 24 hours: Vitals:   10/24/19 0622 10/24/19 0726  BP: (!) 107/56 100/62  Pulse: (!) 124 (!) 112  Resp: 18 19  Temp: 98.9 F (37.2 C) 98.8 F (37.1 C)  SpO2: 96% 99%    Physical Exam:  General:  Alert, oriented, cooperative, no distress, appears stated age  Head:  Normocephalic, without obvious abnormality, atraumatic  Eyes:  Mild conjunctival pallor, EOMs intact  Lungs:   Clear to auscultation bilaterally, respirations unlabored  Heart:  Tachycardic with irregularly irregular rhythm (A fib)  Abdomen:   Soft, non-tender, nondistended, normoactive bowel sounds, no guarding or peritoneal signs  Extremities: Extremities normal, atraumatic, no  edema  Pulses: 2+ and symmetric    Lab Results: Recent Labs    10/22/19 1720 10/24/19 0326  NA 139 138  K 4.3 2.9*  CL 105 105  CO2 23 22  GLUCOSE 217* 126*  BUN 18 7*  CREATININE 1.10* 0.95  CALCIUM 9.0 8.9   Recent Labs    10/22/19 1720  AST 14*  ALT 13  ALKPHOS 44  BILITOT 0.6  PROT 6.5  ALBUMIN 3.4*   Recent Labs    10/22/19 1720 10/22/19 2158 10/23/19 1657 10/24/19 0326  WBC 12.4*  --   --  18.4*  HGB 6.9*   < > 8.0* 8.1*  HCT 24.0*   < > 27.0* 28.1*  MCV 77.4*  --   --  78.3*  PLT 458*  --   --  431*   < > = values in this interval not  displayed.   No results for input(s): LABPROT, INR in the last 72 hours.  Impression: Iron deficiency anemia in the setting of Plavix use (last dose of Plavix 5/25).  -Hgb 8.1, improved from 6.9 after 1u pRBCs 5/26 -BUN 7/ Cr 0.95, improved from BUN 18/ Cr 1.10 on 5/25  Hypokalemia, K+ of 2.9 this morning, receiving IV potassium  A fib with RVR: patient reports she hasn't been taking Altace at home but has been compliant with metoprolol and Plavix  Plan: EGD tomorrow if heart rate is controlled and hypokalemia improves.  I thoroughly educated the patient on the risks (to including bleeding, perforation, infection, anesthesia) and benefits of the procedure and she gave verbal consent to proceed with the EGD.  Continue to monitor Hgb and transfuse as needed to maintain Hgb >8.  Continue IV Protonix BID.  Continue to hold Plavix.  [facility] will follow.   Salley Slaughter PA-C 10/24/2019, 9:45 AM  Contact #  [phone]

## 2019-10-24 NOTE — Progress Notes (Signed)
Patient's HR still sustaining in the 120's-140's. Patient denies any c/o. Denny,NP text paged. Order received in epic. Will continue to monitor.

## 2019-10-25 ENCOUNTER — Encounter (HOSPITAL_COMMUNITY)
Admission: EM | Disposition: A | Discharge status: Home / Self Care | Payer: Self-pay | Source: Home / Self Care | Attending: Internal Medicine

## 2019-10-25 ENCOUNTER — Encounter (HOSPITAL_COMMUNITY): Payer: Self-pay | Admitting: Internal Medicine

## 2019-10-25 ENCOUNTER — Inpatient Hospital Stay (HOSPITAL_COMMUNITY): Payer: Medicare Other | Admitting: Certified Registered"

## 2019-10-25 DIAGNOSIS — I480 Paroxysmal atrial fibrillation: Secondary | ICD-10-CM

## 2019-10-25 DIAGNOSIS — I350 Nonrheumatic aortic (valve) stenosis: Secondary | ICD-10-CM

## 2019-10-25 HISTORY — PX: BIOPSY: SHX5522

## 2019-10-25 HISTORY — PX: ESOPHAGOGASTRODUODENOSCOPY: SHX5428

## 2019-10-25 LAB — CBC
HCT: 25.5 % — ABNORMAL LOW (ref 36.0–46.0)
Hemoglobin: 7.4 g/dL — ABNORMAL LOW (ref 12.0–15.0)
MCH: 22.8 pg — ABNORMAL LOW (ref 26.0–34.0)
MCHC: 29 g/dL — ABNORMAL LOW (ref 30.0–36.0)
MCV: 78.5 fL — ABNORMAL LOW (ref 80.0–100.0)
Platelets: 404 10*3/uL — ABNORMAL HIGH (ref 150–400)
RBC: 3.25 MIL/uL — ABNORMAL LOW (ref 3.87–5.11)
RDW: 19.9 % — ABNORMAL HIGH (ref 11.5–15.5)
WBC: 15.8 10*3/uL — ABNORMAL HIGH (ref 4.0–10.5)
nRBC: 0 % (ref 0.0–0.2)

## 2019-10-25 LAB — BASIC METABOLIC PANEL
Anion gap: 9 (ref 5–15)
BUN: 11 mg/dL (ref 8–23)
CO2: 23 mmol/L (ref 22–32)
Calcium: 8.8 mg/dL — ABNORMAL LOW (ref 8.9–10.3)
Chloride: 106 mmol/L (ref 98–111)
Creatinine, Ser: 1.02 mg/dL — ABNORMAL HIGH (ref 0.44–1.00)
GFR calc Af Amer: 58 mL/min — ABNORMAL LOW (ref >60)
GFR calc non Af Amer: 50 mL/min — ABNORMAL LOW (ref >60)
Glucose, Bld: 103 mg/dL — ABNORMAL HIGH (ref 70–99)
Potassium: 4.3 mmol/L (ref 3.5–5.1)
Sodium: 138 mmol/L (ref 135–145)

## 2019-10-25 LAB — GLUCOSE, CAPILLARY
Glucose-Capillary: 93 mg/dL (ref 70–99)
Glucose-Capillary: 103 mg/dL — ABNORMAL HIGH (ref 70–99)
Glucose-Capillary: 107 mg/dL — ABNORMAL HIGH (ref 70–99)
Glucose-Capillary: 196 mg/dL — ABNORMAL HIGH (ref 70–99)
Glucose-Capillary: 153 mg/dL — ABNORMAL HIGH (ref 70–99)

## 2019-10-25 LAB — URINE CULTURE: Culture: >=100000 — AB

## 2019-10-25 LAB — MAGNESIUM: Magnesium: 2 mg/dL (ref 1.7–2.4)

## 2019-10-25 LAB — PREPARE RBC (CROSSMATCH)

## 2019-10-25 SURGERY — EGD (ESOPHAGOGASTRODUODENOSCOPY)
Anesthesia: Monitor Anesthesia Care

## 2019-10-25 MED ORDER — PEG 3350-KCL-NA BICARB-NACL 420 G PO SOLR
4000.0000 mL | Freq: Once | ORAL | Status: AC
  Administered 2019-10-25: 4000 mL via ORAL
  Filled 2019-10-25 (×2): qty 4000

## 2019-10-25 MED ORDER — SODIUM CHLORIDE 0.9% IV SOLUTION: Freq: Once | INTRAVENOUS | Status: AC

## 2019-10-25 MED ORDER — LACTATED RINGERS IV SOLN: INTRAVENOUS | Status: DC

## 2019-10-25 MED ORDER — AMOXICILLIN 500 MG PO CAPS
500.0000 mg | ORAL_CAPSULE | Freq: Three times a day (TID) | ORAL | Status: DC
  Administered 2019-10-25 – 2019-10-27 (×6): 500 mg via ORAL
  Filled 2019-10-25 (×9): qty 1

## 2019-10-25 MED ORDER — PROPOFOL 500 MG/50ML IV EMUL
INTRAVENOUS | Status: DC | PRN
  Administered 2019-10-25: 150 ug/kg/min via INTRAVENOUS

## 2019-10-25 NOTE — Progress Notes (Signed)
[facility] PROGRESS NOTE   Sophia Ward [id number] DOB: 27-Nov-1932 DOA: 10/22/2019  PCP: [name], MD  Brief History/Interval Summary: Sophia Ward is a 86 y.o. female with medical history significant of  GERD, CAD s/p MI s/p stent on plavix, DMII, HTN , HLD,RA on MTX, PMR on chronic prednisone, spinal stenosis managed with epidural injections who has had interim history of increase fatigue x 2-3 weeks. Patient states she followed up with her primary for routine visit and on evaluation was found to have drop in hgb to 7.9 from base of 13.4 on 10/02/18. Plan was made for continued monitoring and recheck in 2 weeks. Patient states during that time her fatigued continued and she began to have increasing shortness of breath with exertion. Patient also noted that she has increase loose stools that were dark but states this has been chronic for some time.  Due to ongoing symptoms she was asked to go to the emergency department.  Consultants: [facility] gastroenterology  Procedures:  Transthoracic echocardiogram 5/27 1. Left ventricular ejection fraction, by estimation, is 60 to 65%. The  left ventricle has normal function. The left ventricle has no regional  wall motion abnormalities. Left ventricular diastolic function could not  be evaluated. Elevated left  ventricular end-diastolic pressure.  2. Right ventricular systolic function is normal. The right ventricular  size is normal. There is moderately elevated pulmonary artery systolic  pressure.  3. Left atrial size was mildly dilated.  4. The mitral valve is abnormal. Trivial mitral valve regurgitation.  5. The aortic valve is tricuspid. Aortic valve regurgitation is trivial.  Severe aortic valve stenosis. Aortic valve area, by VTI measures 0.76 cm.  Aortic valve mean gradient measures 32.0 mmHg. Aortic valve Vmax measures  3.77 m/s.  6. The inferior vena cava is dilated in size with <50% respiratory   variability, suggesting right atrial pressure of 15 mmHg.   Comparison(s): Changes from prior study are noted. 02/02/2018: LVEF 60-65%,  moderate AS - mean gradient 22 mmHg.   Antibiotics: Anti-infectives (From admission, onward)   Start     Dose/Rate Route Frequency Ordered Stop   10/24/19 1800  nitrofurantoin (macrocrystal-monohydrate) (MACROBID) capsule 100 mg     100 mg Oral Every 12 hours 10/24/19 1701        Subjective/Interval History: Patient denies any new complaints.  Denies any chest pain or shortness of breath.  She converted back to sinus rhythm in the afternoon yesterday.  She had a brown bowel movement.  Has not noticed any blood in the stool.    ROS: Denies any abdominal pain, nausea or vomiting    Assessment/Plan:  Symptomatic microcytic anemia Significant drop in hemoglobin to around 7 noted from her usual baseline of 12-13.  Patient was transfused 1 unit of blood due to her symptoms.  She did notice dark to black-colored stool recently.  Denies any fresh red blood per rectum.  She is on Plavix for her heart disease.  Last colonoscopy was in 2014.  No concerning findings were noted per patient.  Anemia panel shows iron deficiency.  CT abdomen showed diverticulosis.  No other concerning findings noted. Patient seen by gastroenterology.  Initially plan was for upper endoscopy on 5/27 however due to electrolyte abnormalities and new onset of atrial fibrillation this was postponed.  Plan is for it to be done today. Hemoglobin noted to be lower today compared to yesterday.  We will transfuse her 1 unit of blood today.  No active bleeding noted.  Paroxysmal atrial fibrillation This is a new diagnosis for her.  She has had palpitations on and off for the past 2 months.  TSH was 2.15.  Echocardiogram shows normal systolic function.  Trivial mitral regurgitation noted.  However more significantly has severe aortic stenosis noted.   Patient was started back on her beta-blocker  yesterday.  She converted to sinus rhythm yesterday afternoon.  Electrolytes have been corrected.  Her chads 2 vascular score is 6.  She is high risk for thromboembolism.  Anticoagulation was discussed with her.  We will also need to wait and see what her GI work-up is before initiating anticoagulation.    Severe aortic stenosis She had moderate aortic stenosis when echocardiogram was done in 2019.  Seems to have progressed.  Now she also has been found to have atrial fibrillation as discussed above.  She will need to see her primary cardiologist, Dr. Irish Lack, in the outpatient setting.  Discussed with him.  He agrees with plans for anticoagulation.  Patient will need repeat echocardiogram in the next few months.  Hypokalemia This has been corrected.  Magnesium level is also corrected.  History of recurrent UTI Apparently followed by ID find any progress notes in the computer.  Unclear if the patient is on chronic suppressive treatment or not.  Was apparently on nitrofurantoin for 10 days and was stopped on May 16.  Urine culture is growing Enterococcus faecalis sensitive to nitrofurantoin.  She was noted to have leukocytosis for which there was no other etiology found.  Patient was placed back on nitrofurantoin.  WBC slightly better today.    Leukocytosis, chronic She is followed by hematology/oncology for chronic leukocytosis which is thought to be due to rheumatoid arthritis.  Hematology notes reviewed.    History of GERD Continue PPI  History of coronary artery disease status post MI status post drug-eluting stent in 2004  Patient on Plavix at home.  This is currently being held due to concern for GI bleed.  May need to stop Plavix if the patient is started on anticoagulation.  Diabetes mellitus type 2, controlled Continue SSI.  HbA1c 7.2.  Also on Levemir.  Essential hypertension Blood pressure stable for the most part.  Continue to monitor.  History of rheumatoid  arthritis/polymyalgia rheumatica Patient on methotrexate and prednisone at home.  Methotrexate on hold currently.  Prednisone being continued.  History of spinal stenosis Managed with epidural injections  Obesity Estimated body mass index is 30.48 kg/m as calculated from the following:   Height as of this encounter: 5\' 2"  (1.575 m).   Weight as of this encounter: 75.6 kg.   DVT Prophylaxis: SCDs Code Status: Full code Family Communication: Discussed with the patient, her husband and daughter Disposition Plan:  Status is: Inpatient  The patient will require care spanning > 2 midnights and should be moved to inpatient because: Ongoing diagnostic testing needed not appropriate for outpatient work up  [place]: The patient is from: Home              Anticipated d/c is to: Home              Anticipated d/c date is: 2 days              Patient currently is not medically stable to d/c.       Medications:  Scheduled: . sodium chloride   Intravenous Once  . atorvastatin  40 mg Oral QHS  . estradiol  1 Applicatorful Vaginal Daily  . folic  acid  2 mg Oral Daily  . insulin aspart  0-9 Units Subcutaneous Q4H  . insulin detemir  14 Units Subcutaneous Daily  . metoprolol tartrate  50 mg Oral BID  . nitrofurantoin (macrocrystal-monohydrate)  100 mg Oral Q12H  . pantoprazole (PROTONIX) IV  40 mg Intravenous Q12H  . predniSONE  4 mg Oral Q breakfast  . sodium chloride flush  3 mL Intravenous Q12H   Continuous: . sodium chloride     [id number] **OR** acetaminophen, albuterol, metoprolol tartrate, ondansetron **OR** ondansetron (ZOFRAN) IV   Objective:  Vital Signs  Vitals:   10/24/19 1610 10/24/19 2141 10/25/19 0444 10/25/19 0920  BP: (!) 121/45 (!) 126/41 (!) 129/40 (!) 143/49  Pulse:  79 74 78  Resp: 18 18 17    Temp: 98.7 F (37.1 C) 98.4 F (36.9 C) 98.2 F (36.8 C) 98.5 F (36.9 C)  TempSrc: Oral  Oral Oral  SpO2: 97% 97% 98% 95%  Weight:      Height:         Intake/Output Summary (Last 24 hours) at 10/25/2019 0931 Last data filed at 10/25/2019 0902 Gross per 24 hour  Intake 120 ml  Output 750 ml  Net -630 ml   Filed Weights   10/22/19 1702 10/23/19 1252  Weight: 78 kg 75.6 kg   General appearance: Awake alert.  In no distress Resp: Clear to auscultation bilaterally.  Normal effort Cardio: S1-S2 is normal regular.  Systolic murmur appreciated over the aortic area GI: Abdomen is soft.  Nontender nondistended.  Bowel sounds are present normal.  No masses organomegaly Extremities: No edema.  Full range of motion of lower extremities. Neurologic: Alert and oriented x3.  No focal neurological deficits.       Lab Results:  Data Reviewed: I have personally reviewed following labs and imaging studies  CBC: Recent Labs  Lab 10/22/19 1720 10/22/19 1720 10/22/19 2158 10/23/19 0841 10/23/19 1657 10/24/19 0326 10/25/19 0356  WBC 12.4*  --   --   --   --  18.4* 15.8*  HGB 6.9*   < > 7.2*  7.2* 8.3* 8.0* 8.1* 7.4*  HCT 24.0*  --   --  28.1* 27.0* 28.1* 25.5*  MCV 77.4*  --   --   --   --  78.3* 78.5*  PLT 458*  --   --   --   --  431* 404*   < > = values in this interval not displayed.    Basic Metabolic Panel: Recent Labs  Lab 10/22/19 1720 10/24/19 0326 10/24/19 1643 10/25/19 0356  NA 139 138 137 138  K 4.3 2.9* 4.4 4.3  CL 105 105 105 106  CO2 23 22 22 23   GLUCOSE 217* 126* 152* 103*  BUN 18 7* 9 11  CREATININE 1.10* 0.95 0.93 1.02*  CALCIUM 9.0 8.9 8.3* 8.8*  MG  --  1.5*  --  2.0    GFR: Estimated Creatinine Clearance: 39.1 mL/min (A) (by C-G formula based on SCr of 1.02 mg/dL (H)).  Liver Function Tests: Recent Labs  Lab 10/22/19 1720  AST 14*  ALT 13  ALKPHOS 44  BILITOT 0.6  PROT 6.5  ALBUMIN 3.4*    HbA1C: Recent Labs    10/22/19 2158  HGBA1C 7.2*    CBG: Recent Labs  Lab 10/24/19 1608 10/24/19 1936 10/24/19 2338 10/25/19 0407 10/25/19 0717  GLUCAP 148* 242* 115* 93 103*     Thyroid  Function Tests: Recent Labs    10/22/19 2158  TSH 2.153    Anemia Panel: Recent Labs    10/22/19 2158  VITAMINB12 343  FOLATE 54.6  FERRITIN 8*  TIBC 452*  IRON 9*  RETICCTPCT 2.9    Recent Results (from the past 240 hour(s))  SARS Coronavirus 2 by RT PCR (hospital order, performed in [facility] [place] hospital lab) Nasopharyngeal Nasopharyngeal Swab     Status: None   Collection Time: 10/22/19  8:21 PM   Specimen: Nasopharyngeal Swab  Result Value Ref Range Status   SARS Coronavirus 2 NEGATIVE NEGATIVE Final    Comment: (NOTE) SARS-CoV-2 target nucleic acids are NOT DETECTED. The SARS-CoV-2 RNA is generally detectable in upper and lower respiratory specimens during the acute phase of infection. The lowest concentration of SARS-CoV-2 viral copies this assay can detect is 250 copies / mL. A negative result does not preclude SARS-CoV-2 infection and should not be used as the sole basis for treatment or other patient management decisions.  A negative result may occur with improper specimen collection / handling, submission of specimen other than nasopharyngeal swab, presence of viral mutation(s) within the areas targeted by this assay, and inadequate number of viral copies (<250 copies / mL). A negative result must be combined with clinical observations, patient history, and epidemiological information. Fact Sheet for Patients:   StrictlyIdeas.no Fact Sheet for Healthcare Providers: BankingDealers.co.za This test is not yet approved or cleared  by the [place] FDA and has been authorized for detection and/or diagnosis of SARS-CoV-2 by FDA under an Emergency Use Authorization (EUA).  This EUA will remain in effect (meaning this test can be used) for the duration of the COVID-19 declaration under Section 564(b)(1) of the Act, 21 U.S.C. section 360bbb-3(b)(1), unless the authorization is terminated or revoked sooner. Performed at  [facility], [place] [address] [place], [place] [address]   Urine culture     Status: Abnormal   Collection Time: 10/23/19  8:24 AM   Specimen: Urine, Random  Result Value Ref Range Status   Specimen Description URINE, RANDOM  Final   Special Requests   Final    NONE Performed at [facility], [place] [address] [place], [place] [address]    Culture >=100,000 COLONIES/mL ENTEROCOCCUS FAECALIS (A)  Final   Report Status 10/25/2019 FINAL  Final   Organism ID, Bacteria ENTEROCOCCUS FAECALIS (A)  Final      Susceptibility   Enterococcus faecalis - MIC*    AMPICILLIN <=2 SENSITIVE Sensitive     NITROFURANTOIN <=16 SENSITIVE Sensitive     VANCOMYCIN 1 SENSITIVE Sensitive     * >=100,000 COLONIES/mL ENTEROCOCCUS FAECALIS      Radiology Studies: DG Chest Port 1 View  Result Date: 10/23/2019 CLINICAL DATA:  Low hemoglobin levels. Cough. EXAM: PORTABLE CHEST 1 VIEW COMPARISON:  11/07/2016 FINDINGS: Aortic atherosclerosis. Moderate cardiac enlargement. No pleural effusion or edema identified. No airspace densities. The visualized osseous structures appear intact. IMPRESSION: No acute cardiopulmonary abnormalities. Aortic Atherosclerosis (ICD10-I70.0). Electronically Signed   By: Kerby Moors M.D.   On: 10/23/2019 09:55   ECHOCARDIOGRAM COMPLETE  Result Date: 10/24/2019    ECHOCARDIOGRAM REPORT   Patient Name:   ETTIE PICTON Date of Exam: 10/24/2019 Medical Rec #:  [id number]          Height:       62.0 in Accession #:    [id number]         Weight:       166.7 lb Date of Birth:  [birth year]  BSA:          1.769 m Patient Age:    33 years           BP:           111/70 mmHg Patient Gender: F                  HR:           87 bpm. Exam Location:  Inpatient Procedure: 2D Echo Indications:    Atrial Fibrillation 427.31 / I48.91  History:        Patient has prior history of Echocardiogram examinations, most                 recent 02/02/2018. CAD and Previous Myocardial Infarction,  Mitral                 Valve Disease; Risk Factors:Hypertension, Dyslipidemia and                 Diabetes.  Sonographer:    [name] RDCS Referring Phys: [place]  1. Left ventricular ejection fraction, by estimation, is 60 to 65%. The left ventricle has normal function. The left ventricle has no regional wall motion abnormalities. Left ventricular diastolic function could not be evaluated. Elevated left ventricular end-diastolic pressure.  2. Right ventricular systolic function is normal. The right ventricular size is normal. There is moderately elevated pulmonary artery systolic pressure.  3. Left atrial size was mildly dilated.  4. The mitral valve is abnormal. Trivial mitral valve regurgitation.  5. The aortic valve is tricuspid. Aortic valve regurgitation is trivial. Severe aortic valve stenosis. Aortic valve area, by VTI measures 0.76 cm. Aortic valve mean gradient measures 32.0 mmHg. Aortic valve Vmax measures 3.77 m/s.  6. The inferior vena cava is dilated in size with <50% respiratory variability, suggesting right atrial pressure of 15 mmHg. Comparison(s): Changes from prior study are noted. 02/02/2018: LVEF 60-65%, moderate AS - mean gradient 22 mmHg. FINDINGS  Left Ventricle: Left ventricular ejection fraction, by estimation, is 60 to 65%. The left ventricle has normal function. The left ventricle has no regional wall motion abnormalities. The left ventricular internal cavity size was normal in size. There is  no left ventricular hypertrophy. Left ventricular diastolic function could not be evaluated due to indeterminate diastolic function. Left ventricular diastolic function could not be evaluated. Elevated left ventricular end-diastolic pressure. Right Ventricle: The right ventricular size is normal. No increase in right ventricular wall thickness. Right ventricular systolic function is normal. There is moderately elevated pulmonary artery systolic pressure. The tricuspid  regurgitant velocity is 2.80 m/s, and with an assumed right atrial pressure of 15 mmHg, the estimated right ventricular systolic pressure is [id number] mmHg. Left Atrium: Left atrial size was mildly dilated. Right Atrium: Right atrial size was normal in size. Pericardium: There is no evidence of pericardial effusion. Mitral Valve: The mitral valve is abnormal. There is mild calcification of the posterior mitral valve leaflet(s). Mild to moderate mitral annular calcification. Trivial mitral valve regurgitation. Tricuspid Valve: The tricuspid valve is grossly normal. Tricuspid valve regurgitation is mild. Aortic Valve: The aortic valve is tricuspid. Aortic valve regurgitation is trivial. Severe aortic stenosis is present. Aortic valve mean gradient measures 32.0 mmHg. Aortic valve peak gradient measures 56.9 mmHg. Aortic valve area, by VTI measures 0.76 cm. Pulmonic Valve: The pulmonic valve was normal in structure. Pulmonic valve regurgitation is trivial. Aorta: The aortic root and ascending aorta are structurally normal, with no  evidence of dilitation. Venous: The inferior vena cava is dilated in size with less than 50% respiratory variability, suggesting right atrial pressure of 15 mmHg. IAS/Shunts: No atrial level shunt detected by color flow Doppler.  LEFT VENTRICLE PLAX 2D LVIDd:         4.50 cm  Diastology LVIDs:         3.20 cm  LV e' lateral:   4.62 cm/s LV PW:         1.00 cm  LV E/e' lateral: 27.2 LV IVS:        1.00 cm  LV e' medial:    4.70 cm/s LVOT diam:     2.00 cm  LV E/e' medial:  26.7 LV SV:         54 LV SV Index:   31 LVOT Area:     3.14 cm  RIGHT VENTRICLE RV S prime:     8.51 cm/s TAPSE (M-mode): 1.4 cm LEFT ATRIUM             Index       RIGHT ATRIUM           Index LA diam:        4.00 cm 2.26 cm/m  RA Area:     16.10 cm LA Vol (A2C):   44.4 ml 25.10 ml/m RA Volume:   41.80 ml  23.63 ml/m LA Vol (A4C):   63.4 ml 35.84 ml/m LA Biplane Vol: 54.5 ml 30.81 ml/m  AORTIC VALVE AV Area (Vmax):     0.80 cm AV Area (Vmean):   0.70 cm AV Area (VTI):     0.76 cm AV Vmax:           377.00 cm/s AV Vmean:          272.000 cm/s AV VTI:            0.713 m AV Peak Grad:      56.9 mmHg AV Mean Grad:      32.0 mmHg LVOT Vmax:         95.80 cm/s LVOT Vmean:        60.800 cm/s LVOT VTI:          0.172 m LVOT/AV VTI ratio: 0.24  AORTA Ao Root diam: 2.80 cm MITRAL VALVE                TRICUSPID VALVE MV Area (PHT): 3.90 cm     TR Peak grad:   31.4 mmHg MV Decel Time: 195 msec     TR Vmax:        280.00 cm/s MV E velocity: 125.50 cm/s MV A velocity: 62.25 cm/s   SHUNTS MV E/A ratio:  2.02         Systemic VTI:  0.17 m                             Systemic Diam: 2.00 cm Lyman Bishop MD Electronically signed by Lyman Bishop MD Signature Date/Time: 10/24/2019/5:10:34 PM    Final        LOS: 2 days   [facility] Pager on [email or web address]  10/25/2019, 9:31 AM

## 2019-10-25 NOTE — Anesthesia Preprocedure Evaluation (Addendum)
Anesthesia Evaluation  Patient identified by MRN, date of birth, ID band Patient awake    Reviewed: Allergy & Precautions, NPO status , Patient's Chart, lab work & pertinent test results, reviewed documented beta blocker date and time   Airway Mallampati: II  TM Distance: >3 FB Neck ROM: Full    Dental no notable dental hx. (+) Teeth Intact, Caps   Pulmonary neg pulmonary ROS,    Pulmonary exam normal breath sounds clear to auscultation       Cardiovascular hypertension, Pt. on medications + CAD, + Past MI and + Cardiac Stents  Normal cardiovascular exam+ dysrhythmias Atrial Fibrillation  Rhythm:Regular Rate:Normal  Stents x 2 2004   Neuro/Psych negative neurological ROS  negative psych ROS   GI/Hepatic Neg liver ROS, GERD  Medicated,  Endo/Other  diabetes, Well Controlled, Type 2, Insulin DependentHyperlipidemia  Renal/GU negative Renal ROS  negative genitourinary   Musculoskeletal  (+) Arthritis , Rheumatoid disorders,    Abdominal (+) + obese,   Peds  Hematology negative hematology ROS (+) anemia ,   Anesthesia Other Findings   Reproductive/Obstetrics                            Anesthesia Physical  Anesthesia Plan  ASA: III  Anesthesia Plan: MAC   Post-op Pain Management:    Induction: Intravenous  PONV Risk Score and Plan: 0 and Propofol infusion  Airway Management Planned: Natural Airway and Nasal Cannula  Additional Equipment: None  Intra-op Plan:   Post-operative Plan:   Informed Consent: I have reviewed the patients History and Physical, chart, labs and discussed the procedure including the risks, benefits and alternatives for the proposed anesthesia with the patient or authorized representative who has indicated his/her understanding and acceptance.     Dental advisory given  Plan Discussed with: CRNA  Anesthesia Plan Comments: (Cancelled by proceduralist -  Afib, Low K+)        Anesthesia Quick Evaluation

## 2019-10-25 NOTE — Interval H&P Note (Signed)
History and Physical Interval Note:  10/25/2019 2:25 PM  Sophia Ward  has presented today for surgery, with the diagnosis of anemia.  The various methods of treatment have been discussed with the patient and family. After consideration of risks, benefits and other options for treatment, the patient has consented to  Procedure(s): ESOPHAGOGASTRODUODENOSCOPY (EGD) (N/A) as a surgical intervention.  The patient's history has been reviewed, patient examined, no change in status, stable for surgery.  I have reviewed the patient's chart and labs.  Questions were answered to the patient's satisfaction.     Patient seen and examined at bedside in endoscopy unit.  Chart reviewed.  Converted back to sinus rhythm.  Denies any GI symptoms.  Labs reviewed.  Received 1 unit of PRBC today.  Risks (bleeding, infection, bowel perforation that could require surgery, sedation-related changes in cardiopulmonary systems), benefits (identification and possible treatment of source of symptoms, exclusion of certain causes of symptoms), and alternatives (watchful waiting, radiographic imaging studies, empiric medical treatment)  were explained to patient/family in detail and patient wishes to proceed.   Ia Leeb

## 2019-10-25 NOTE — Plan of Care (Signed)
  Problem: Activity: Goal: Risk for activity intolerance will decrease Outcome: Progressing   

## 2019-10-25 NOTE — Brief Op Note (Signed)
10/22/2019 - 10/25/2019  3:02 PM  PATIENT:  [name]  86 y.o. female  PRE-OPERATIVE DIAGNOSIS:  anemia  POST-OPERATIVE DIAGNOSIS:  biopsy small bowel r/o celiac, gastric biopsy r/o h. pylori, esophagus biopsy r/o candida  PROCEDURE:  Procedure(s): ESOPHAGOGASTRODUODENOSCOPY (EGD) (N/A) BIOPSY  SURGEON:  Surgeon(s) and Role:    * Daxson Reffett, MD - Primary  Findings ------------- -EGD negative for acute bleeding or ulcer disease.  Showed some scattered white specks in the esophagus which could be from medication use or could be from Candida esophagitis biopsies taken.  Recommendations ------------------------ -EGD findings discussed with the patient.  She is willing to undergo colonoscopy for tomorrow. -Clear liquid diet today -Start prep -Keep n.p.o. past midnight -Monitor H&H -GI will follow  Otis Brace MD, [place] 10/25/2019, 3:03 PM  Contact #  [phone]

## 2019-10-25 NOTE — Op Note (Signed)
[facility] Patient Name: Sophia Ward Procedure Date : 10/25/2019 MRN: [id number] Attending MD: Otis Brace , MD Date of Birth: 1932-11-28 CSN: [id number] Age: 86 Admit Type: Inpatient Procedure:                Upper GI endoscopy Indications:              Iron deficiency anemia Providers:                Otis Brace, MD, Clyde Lundborg, RN, Glori Bickers, RN, Elspeth Cho Tech., Technician, Lelon Perla, CRNA Referring MD:              Medicines:                Sedation Administered by an Anesthesia Professional Complications:            No immediate complications. Estimated Blood Loss:     Estimated blood loss was minimal. Procedure:                Pre-Anesthesia Assessment:                           - Prior to the procedure, a History and Physical                            was performed, and patient medications and                            allergies were reviewed. The patient's tolerance of                            previous anesthesia was also reviewed. The risks                            and benefits of the procedure and the sedation                            options and risks were discussed with the patient.                            All questions were answered, and informed consent                            was obtained. Prior Anticoagulants: The patient has                            taken Plavix (clopidogrel), last dose was 3 days                            prior to procedure. ASA Grade Assessment: III - A  patient with severe systemic disease. After                            reviewing the risks and benefits, the patient was                            deemed in satisfactory condition to undergo the                            procedure.                           After obtaining informed consent, the endoscope was                            passed under direct  vision. Throughout the                            procedure, the patient's blood pressure, pulse, and                            oxygen saturations were monitored continuously. The                            [id number]) Olympus gastroscope was                            introduced through the mouth, and advanced to the                            second part of duodenum. The upper GI endoscopy was                            accomplished without difficulty. The patient                            tolerated the procedure well. Scope In: Scope Out: Findings:      Scattered mild mucosal changes characterized by white specks were found       in the entire esophagus. Biopsies were taken with a cold forceps for       histology.      The Z-line was regular and was found 38 cm from the incisors.      Normal mucosa was found in the entire examined stomach. Biopsies were       taken with a cold forceps for histology.      The cardia and gastric fundus were normal on retroflexion.      The duodenal bulb, first portion of the duodenum and second portion of       the duodenum were normal. Biopsies for histology were taken with a cold       forceps for evaluation of celiac disease. Impression:               - White specked mucosa in the esophagus. Biopsied.                           -  Z-line regular, 38 cm from the incisors.                           - Normal mucosa was found in the entire stomach.                            Biopsied.                           - Normal duodenal bulb, first portion of the                            duodenum and second portion of the duodenum.                            Biopsied. Recommendation:           - Return patient to hospital ward for ongoing care.                           - Resume previous diet.                           - Continue present medications.                           - Await pathology results. Procedure Code(s):        --- Professional ---                            [phone], Esophagogastroduodenoscopy, flexible,                            transoral; with biopsy, single or multiple Diagnosis Code(s):        --- Professional ---                           K22.8, Other specified diseases of esophagus                           D50.9, Iron deficiency anemia, unspecified CPT copyright 2019 American [employer] Association. All rights reserved. The codes documented in this report are preliminary and upon coder review may  be revised to meet current compliance requirements. Otis Brace, MD Otis Brace, MD 10/25/2019 2:47:33 PM Number of Addenda: 0

## 2019-10-25 NOTE — Anesthesia Postprocedure Evaluation (Signed)
Anesthesia Post Note  Patient: Sophia Ward  Procedure(s) Performed: ESOPHAGOGASTRODUODENOSCOPY (EGD) (N/A ) BIOPSY     Patient location during evaluation: PACU Anesthesia Type: MAC Level of consciousness: awake and alert and oriented Pain management: pain level controlled Vital Signs Assessment: post-procedure vital signs reviewed and stable Respiratory status: spontaneous breathing, nonlabored ventilation and respiratory function stable Cardiovascular status: stable and blood pressure returned to baseline Postop Assessment: no apparent nausea or vomiting Anesthetic complications: no    Last Vitals:  Vitals:   10/25/19 1505 10/25/19 1515  BP: 119/85 (!) 146/75  Pulse: 81 78  Resp: (!) 27 (!) 24  Temp:    SpO2: 94% 98%    Last Pain:  Vitals:   10/25/19 1515  TempSrc:   PainSc: 0-No pain                 Terasa Orsini A.

## 2019-10-25 NOTE — Transfer of Care (Signed)
Immediate Anesthesia Transfer of Care Note  Patient: Sophia Ward  Procedure(s) Performed: ESOPHAGOGASTRODUODENOSCOPY (EGD) (N/A ) BIOPSY  Patient Location: PACU and Endoscopy Unit  Anesthesia Type:MAC  Level of Consciousness: awake and alert   Airway & Oxygen Therapy: Patient Spontanous Breathing and Patient connected to nasal cannula oxygen  Post-op Assessment: Post -op Vital signs reviewed and stable  Post vital signs: Reviewed and stable  Last Vitals:  Vitals Value Taken Time  BP 119/85 10/25/19 1505  Temp 37.1 C 10/25/19 1455  Pulse 79 10/25/19 1513  Resp 26 10/25/19 1513  SpO2 95 % 10/25/19 1513  Vitals shown include unvalidated device data.  Last Pain:  Vitals:   10/25/19 1505  TempSrc:   PainSc: 0-No pain         Complications: No apparent anesthesia complications

## 2019-10-26 ENCOUNTER — Inpatient Hospital Stay (HOSPITAL_COMMUNITY): Payer: Medicare Other | Admitting: Anesthesiology

## 2019-10-26 ENCOUNTER — Encounter (HOSPITAL_COMMUNITY)
Admission: EM | Disposition: A | Discharge status: Home / Self Care | Payer: Self-pay | Source: Home / Self Care | Attending: Internal Medicine

## 2019-10-26 HISTORY — PX: COLONOSCOPY WITH PROPOFOL: SHX5780

## 2019-10-26 HISTORY — PX: BIOPSY: SHX5522

## 2019-10-26 LAB — TYPE AND SCREEN
ABO/RH(D): O POS
Antibody Screen: NEGATIVE
Unit division: 0
Unit division: 0

## 2019-10-26 LAB — GLUCOSE, CAPILLARY
Glucose-Capillary: 107 mg/dL — ABNORMAL HIGH (ref 70–99)
Glucose-Capillary: 152 mg/dL — ABNORMAL HIGH (ref 70–99)
Glucose-Capillary: 230 mg/dL — ABNORMAL HIGH (ref 70–99)
Glucose-Capillary: 79 mg/dL (ref 70–99)
Glucose-Capillary: 86 mg/dL (ref 70–99)
Glucose-Capillary: 86 mg/dL (ref 70–99)

## 2019-10-26 LAB — BPAM RBC
Blood Product Expiration Date: 202105292359
Blood Product Expiration Date: 202106232359
ISSUE DATE / TIME: 202105260036
ISSUE DATE / TIME: 202105280919
Unit Type and Rh: 5100
Unit Type and Rh: 5100

## 2019-10-26 LAB — BASIC METABOLIC PANEL
Anion gap: 9 (ref 5–15)
BUN: 9 mg/dL (ref 8–23)
CO2: 23 mmol/L (ref 22–32)
Calcium: 8.8 mg/dL — ABNORMAL LOW (ref 8.9–10.3)
Chloride: 106 mmol/L (ref 98–111)
Creatinine, Ser: 0.91 mg/dL (ref 0.44–1.00)
GFR calc Af Amer: >60 mL/min (ref >60)
GFR calc non Af Amer: 58 mL/min — ABNORMAL LOW (ref >60)
Glucose, Bld: 97 mg/dL (ref 70–99)
Potassium: 4 mmol/L (ref 3.5–5.1)
Sodium: 138 mmol/L (ref 135–145)

## 2019-10-26 LAB — CBC
HCT: 29.3 % — ABNORMAL LOW (ref 36.0–46.0)
Hemoglobin: 8.9 g/dL — ABNORMAL LOW (ref 12.0–15.0)
MCH: 23.7 pg — ABNORMAL LOW (ref 26.0–34.0)
MCHC: 30.4 g/dL (ref 30.0–36.0)
MCV: 78.1 fL — ABNORMAL LOW (ref 80.0–100.0)
Platelets: 396 10*3/uL (ref 150–400)
RBC: 3.75 MIL/uL — ABNORMAL LOW (ref 3.87–5.11)
RDW: 19.9 % — ABNORMAL HIGH (ref 11.5–15.5)
WBC: 15.3 10*3/uL — ABNORMAL HIGH (ref 4.0–10.5)
nRBC: 0.2 % (ref 0.0–0.2)

## 2019-10-26 LAB — MAGNESIUM: Magnesium: 1.7 mg/dL (ref 1.7–2.4)

## 2019-10-26 SURGERY — COLONOSCOPY WITH PROPOFOL
Anesthesia: Monitor Anesthesia Care

## 2019-10-26 MED ORDER — APIXABAN 5 MG PO TABS
5.0000 mg | ORAL_TABLET | Freq: Two times a day (BID) | ORAL | Status: DC
  Administered 2019-10-26 – 2019-10-27 (×2): 5 mg via ORAL
  Filled 2019-10-26 (×2): qty 1

## 2019-10-26 MED ORDER — PANTOPRAZOLE SODIUM 40 MG PO TBEC
40.0000 mg | DELAYED_RELEASE_TABLET | Freq: Every day | ORAL | Status: DC
  Administered 2019-10-26 – 2019-10-27 (×2): 40 mg via ORAL
  Filled 2019-10-26 (×2): qty 1

## 2019-10-26 MED ORDER — INSULIN DETEMIR 100 UNIT/ML ~~LOC~~ SOLN
14.0000 [IU] | Freq: Every day | SUBCUTANEOUS | Status: DC
  Administered 2019-10-27: 14 [IU] via SUBCUTANEOUS
  Filled 2019-10-26: qty 0.14

## 2019-10-26 MED ORDER — PHENYLEPHRINE 40 MCG/ML (10ML) SYRINGE FOR IV PUSH (FOR BLOOD PRESSURE SUPPORT)
PREFILLED_SYRINGE | INTRAVENOUS | Status: DC | PRN
  Administered 2019-10-26 (×3): 40 ug via INTRAVENOUS

## 2019-10-26 MED ORDER — ONDANSETRON HCL 4 MG/2ML IJ SOLN: 4.0000 mg | Freq: Once | INTRAMUSCULAR | Status: DC | PRN

## 2019-10-26 MED ORDER — FUROSEMIDE 10 MG/ML IJ SOLN
20.0000 mg | Freq: Once | INTRAMUSCULAR | Status: AC
  Administered 2019-10-26: 20 mg via INTRAVENOUS
  Filled 2019-10-26: qty 2

## 2019-10-26 MED ORDER — PROPOFOL 500 MG/50ML IV EMUL
INTRAVENOUS | Status: DC | PRN
  Administered 2019-10-26: 50 ug/kg/min via INTRAVENOUS

## 2019-10-26 MED ORDER — LACTATED RINGERS IV SOLN: INTRAVENOUS | Status: DC

## 2019-10-26 MED ORDER — SODIUM CHLORIDE 0.9 % IV SOLN: INTRAVENOUS | Status: DC

## 2019-10-26 MED ORDER — MAGNESIUM SULFATE 2 GM/50ML IV SOLN
2.0000 g | Freq: Once | INTRAVENOUS | Status: AC
  Administered 2019-10-26: 2 g via INTRAVENOUS
  Filled 2019-10-26: qty 50

## 2019-10-26 SURGICAL SUPPLY — 22 items

## 2019-10-26 NOTE — Progress Notes (Addendum)
Noticed Mews  Score of 2-yellow.Patient is in colonoscopy at this time.

## 2019-10-26 NOTE — Anesthesia Postprocedure Evaluation (Signed)
Anesthesia Post Note  Patient: Sophia Ward  Procedure(s) Performed: COLONOSCOPY WITH PROPOFOL (N/A ) BIOPSY     Patient location during evaluation: PACU Anesthesia Type: MAC Level of consciousness: awake and alert Pain management: pain level controlled Vital Signs Assessment: post-procedure vital signs reviewed and stable Respiratory status: spontaneous breathing, nonlabored ventilation and respiratory function stable Cardiovascular status: stable and blood pressure returned to baseline Anesthetic complications: no    Last Vitals:  Vitals:   10/26/19 1240 10/26/19 1248  BP: (!) 139/53 (!) 152/52  Pulse: 78 80  Resp: 20 (!) 24  Temp:    SpO2: 95% 96%    Last Pain:  Vitals:   10/26/19 1231  TempSrc: Oral  PainSc: 0-No pain                 Audry Pili

## 2019-10-26 NOTE — Op Note (Signed)
[facility] Patient Name: Sophia Ward Procedure Date : 10/26/2019 MRN: [id number] Attending MD: Otis Brace , MD Date of Birth: 07-26-1932 CSN: [id number] Age: 86 Admit Type: Inpatient Procedure:                Colonoscopy Indications:              Last colonoscopy: date unknown (unable to locate                            last colonoscopy report), Iron deficiency anemia Providers:                Otis Brace, MD, Grace Isaac, RN, William Dalton, Technician Referring MD:              Medicines:                Sedation Administered by an Anesthesia Professional Complications:            No immediate complications. Estimated Blood Loss:     Estimated blood loss was minimal. Procedure:                Pre-Anesthesia Assessment:                           - Prior to the procedure, a History and Physical                            was performed, and patient medications and                            allergies were reviewed. The patient's tolerance of                            previous anesthesia was also reviewed. The risks                            and benefits of the procedure and the sedation                            options and risks were discussed with the patient.                            All questions were answered, and informed consent                            was obtained. Prior Anticoagulants: The patient has                            taken no previous anticoagulant or antiplatelet                            agents. ASA Grade Assessment: III - A patient with  severe systemic disease. After reviewing the risks                            and benefits, the patient was deemed in                            satisfactory condition to undergo the procedure.                           After obtaining informed consent, the colonoscope                            was passed under direct vision.  Throughout the                            procedure, the patient's blood pressure, pulse, and                            oxygen saturations were monitored continuously. The                            [id number]) Olympus pediatric colonscope                            was introduced through the anus and advanced to the                            the terminal ileum, with identification of the                            appendiceal orifice and IC valve. The colonoscopy                            was performed without difficulty. The patient                            tolerated the procedure well. The quality of the                            bowel preparation was fair. Scope In: 12:11:09 PM Scope Out: 12:27:01 PM Scope Withdrawal Time: 0 hours 11 minutes 20 seconds  Total Procedure Duration: 0 hours 15 minutes 52 seconds  Findings:      Hemorrhoids were found on perianal exam.      The terminal ileum contained a few ulcers. No bleeding was present. No       stigmata of recent bleeding were seen. Biopsies were taken with a cold       forceps for histology.      Normal mucosa was found in the entire colon. Biopsies were taken with a       cold forceps for histology.      A few diverticula were found in the sigmoid colon.      Internal hemorrhoids were found during retroflexion. The hemorrhoids       were large. Impression:               -  Preparation of the colon was fair.                           - Hemorrhoids found on perianal exam.                           - A few ulcers in the terminal ileum. Biopsied.                           - Normal mucosa in the entire examined colon.                            Biopsied.                           - Diverticulosis in the sigmoid colon.                           - Internal hemorrhoids. Recommendation:           - Return patient to hospital ward for ongoing care.                           - Cardiac diet.                           -  Continue present medications.                           - Await pathology results. Procedure Code(s):        --- Professional ---                           [phone], Colonoscopy, flexible; with biopsy, single                            or multiple Diagnosis Code(s):        --- Professional ---                           K64.8, Other hemorrhoids                           K63.3, Ulcer of intestine                           D50.9, Iron deficiency anemia, unspecified                           K57.30, Diverticulosis of large intestine without                            perforation or abscess without bleeding CPT copyright 2019 American Medical Association. All rights reserved. The codes documented in this report are preliminary and upon coder review may  be revised to meet current compliance requirements. Otis Brace, MD Otis Brace, MD 10/26/2019 12:40:43 PM Number of Addenda: 0

## 2019-10-26 NOTE — Progress Notes (Signed)
ANTICOAGULATION CONSULT NOTE - Initial Consult  Pharmacy Consult for apixaban Indication: atrial fibrillation  Allergies  Allergen Reactions  . Codeine Nausea And Vomiting    MAKES HER SICK  . Glimepiride Other (See Comments)    Can't remember  . Jardiance [Empagliflozin] Other (See Comments)    Yeast  . Metformin And Related Diarrhea  . Penicillins Rash    Patient Measurements: Height: 5\' 2"  (157.5 cm) Weight: 75.6 kg (166 lb 10.7 oz) IBW/kg (Calculated) : 50.1  Vital Signs: Temp: 98.4 F (36.9 C) (05/29 1231) Temp Source: Oral (05/29 1231) BP: 152/52 (05/29 1248) Pulse Rate: 80 (05/29 1248)  Labs: Recent Labs    10/24/19 0326 10/24/19 0326 10/24/19 1643 10/25/19 0356 10/26/19 0354  HGB 8.1*   < >  --  7.4* 8.9*  HCT 28.1*  --   --  25.5* 29.3*  PLT 431*  --   --  404* 396  CREATININE 0.95   < > 0.93 1.02* 0.91   < > = values in this interval not displayed.    Estimated Creatinine Clearance: 43.8 mL/min (by C-G formula based on SCr of 0.91 mg/dL).   Medical History: Past Medical History:  Diagnosis Date  . Allergic rhinitis   . Anemia 09/2019  . Arthritis    hands  . BPPV (benign paroxysmal positional vertigo)   . Coronary artery disease   . Coronary atherosclerosis of native coronary artery 01/30/2014  . Diabetes mellitus without complication ([place])   . Essential hypertension, benign 01/30/2014  . GERD (gastroesophageal reflux disease)   . Hypertension   . Mixed hyperlipidemia 01/30/2014  . Myocardial infarction ([place]) 2004   mild MI-no damage- had stents  . Osteopenia   . Post-menopausal   . Rheumatoid arthritis ([place])   . Spinal stenosis   . Stenosing tenosynovitis     Assessment: 72 yof to start apixaban for new afib. Hgb is low at 8.9 and platelets are WNL. No bleeding noted. She is not on anticoagulation PTA.   Goal of Therapy:  Stroke prevention Monitor platelets by anticoagulation protocol: Yes   Plan:  Apixaban 5mg  PO BID F/u renal fxn,  S&S of bleeding  Sumeet Geter, Rande Lawman 10/26/2019,5:20 PM

## 2019-10-26 NOTE — Anesthesia Preprocedure Evaluation (Addendum)
Anesthesia Evaluation  Patient identified by MRN, date of birth, ID band Patient awake    Reviewed: Allergy & Precautions, NPO status , Patient's Chart, lab work & pertinent test results  History of Anesthesia Complications Negative for: history of anesthetic complications  Airway Mallampati: III  TM Distance: >3 FB Neck ROM: Full    Dental  (+) Dental Advisory Given   Pulmonary neg pulmonary ROS,    Pulmonary exam normal        Cardiovascular hypertension, Pt. on medications + CAD and + Past MI  + Valvular Problems/Murmurs AS  Rhythm:Regular Rate:Normal + Systolic murmurs  '21 TTE - EF 60 to 65%. Elevated left ventricular end-diastolic pressure. There is moderately elevated pulmonary artery systolic pressure. Left atrial size was mildly dilated. Trivial mitral valve regurgitation. Aortic valve regurgitation is trivial. Severe aortic valve stenosis. Aortic valve area, by VTI measures 0.76 cm. Aortic valve mean gradient measures 32.0 mmHg. Aortic valve Vmax measures 3.77 m/s.     Neuro/Psych  BPPV  negative psych ROS   GI/Hepatic Neg liver ROS, GERD  Medicated,  Endo/Other  diabetes Obesity   Renal/GU negative Renal ROS     Musculoskeletal  (+) Arthritis , Rheumatoid disorders,   Spinal stenosis PMR    Abdominal   Peds  Hematology  (+) anemia ,   Anesthesia Other Findings Covid neg 5/25   Reproductive/Obstetrics                            Anesthesia Physical Anesthesia Plan  ASA: IV  Anesthesia Plan: MAC   Post-op Pain Management:    Induction: Intravenous  PONV Risk Score and Plan: 2 and Propofol infusion and Treatment may vary due to age or medical condition  Airway Management Planned: Natural Airway and Simple Face Mask  Additional Equipment: None  Intra-op Plan:   Post-operative Plan:   Informed Consent: I have reviewed the patients History and Physical, chart,  labs and discussed the procedure including the risks, benefits and alternatives for the proposed anesthesia with the patient or authorized representative who has indicated his/her understanding and acceptance.       Plan Discussed with: CRNA and Anesthesiologist  Anesthesia Plan Comments:        Anesthesia Quick Evaluation

## 2019-10-26 NOTE — Transfer of Care (Signed)
Immediate Anesthesia Transfer of Care Note  Patient: Sophia Ward  Procedure(s) Performed: COLONOSCOPY WITH PROPOFOL (N/A ) BIOPSY  Patient Location: PACU  Anesthesia Type:MAC  Level of Consciousness: awake, alert , oriented and patient cooperative  Airway & Oxygen Therapy: Patient Spontanous Breathing and Patient connected to nasal cannula oxygen  Post-op Assessment: Report given to RN and Post -op Vital signs reviewed and stable  Post vital signs: Reviewed and stable  Last Vitals:  Vitals Value Taken Time  BP 117/30 10/26/19 1231  Temp 36.9 C 10/26/19 1231  Pulse 79 10/26/19 1231  Resp 27 10/26/19 1231  SpO2 96 % 10/26/19 1231    Last Pain:  Vitals:   10/26/19 1231  TempSrc: Oral  PainSc: 0-No pain         Complications: No apparent anesthesia complications

## 2019-10-26 NOTE — Brief Op Note (Addendum)
10/22/2019 - 10/26/2019  12:41 PM  PATIENT:  [name]  86 y.o. female  PRE-OPERATIVE DIAGNOSIS:  Anemia  POST-OPERATIVE DIAGNOSIS:  no active bleeding, biopsies taken  PROCEDURE:  Procedure(s): COLONOSCOPY WITH PROPOFOL (N/A) BIOPSY  SURGEON:  Surgeon(s) and Role:    * Rand Boller, MD - Primary  Findings ------------- -Colonoscopy showed fair prep and few scattered terminal ileal ulcers.  Biopsies taken.  No evidence of active bleeding.  Also showed diverticulosis and hemorrhoids.  Recommendations ------------------------- -Advance diet  -Okay to use anticoagulation from GI standpoint if needed -Continue Protonix once a day for at least 4 weeks -No further inpatient GI work-up planned. -Follow-up in GI clinic in 2 to 4 weeks after discharge. -GI will sign off.  Call us back if needed - D/W daughter   Otis Brace MD, [place] 10/26/2019, 12:43 PM  Contact #  [phone]

## 2019-10-26 NOTE — Progress Notes (Addendum)
[facility] PROGRESS NOTE   Sophia Ward [id number] DOB: 12-May-1933 DOA: 10/22/2019  PCP: [name], MD  Brief History/Interval Summary: Sophia Ward is a 86 y.o. female with medical history significant of  GERD, CAD s/p MI s/p stent on plavix, DMII, HTN , HLD,RA on MTX, PMR on chronic prednisone, spinal stenosis managed with epidural injections who has had interim history of increase fatigue x 2-3 weeks. Patient states she followed up with her primary for routine visit and on evaluation was found to have drop in hgb to 7.9 from base of 13.4 on 10/02/18. Plan was made for continued monitoring and recheck in 2 weeks. Patient states during that time her fatigued continued and she began to have increasing shortness of breath with exertion. Patient also noted that she has increase loose stools that were dark but states this has been chronic for some time.  Due to ongoing symptoms she was asked to go to the emergency department.  Consultants: [facility] gastroenterology  Procedures:  Transthoracic echocardiogram 5/27 1. Left ventricular ejection fraction, by estimation, is 60 to 65%. The  left ventricle has normal function. The left ventricle has no regional  wall motion abnormalities. Left ventricular diastolic function could not  be evaluated. Elevated left  ventricular end-diastolic pressure.  2. Right ventricular systolic function is normal. The right ventricular  size is normal. There is moderately elevated pulmonary artery systolic  pressure.  3. Left atrial size was mildly dilated.  4. The mitral valve is abnormal. Trivial mitral valve regurgitation.  5. The aortic valve is tricuspid. Aortic valve regurgitation is trivial.  Severe aortic valve stenosis. Aortic valve area, by VTI measures 0.76 cm.  Aortic valve mean gradient measures 32.0 mmHg. Aortic valve Vmax measures  3.77 m/s.  6. The inferior vena cava is dilated in size with <50% respiratory    variability, suggesting right atrial pressure of 15 mmHg.   Comparison(s): Changes from prior study are noted. 02/02/2018: LVEF 60-65%,  moderate AS - mean gradient 22 mmHg.   Upper endoscopy 5/28 Findings:      Scattered mild mucosal changes characterized by white specks were found       in the entire esophagus. Biopsies were taken with a cold forceps for       histology.      The Z-line was regular and was found 38 cm from the incisors.      Normal mucosa was found in the entire examined stomach. Biopsies were       taken with a cold forceps for histology.      The cardia and gastric fundus were normal on retroflexion.      The duodenal bulb, first portion of the duodenum and second portion of       the duodenum were normal. Biopsies for histology were taken with a cold       forceps for evaluation of celiac disease. Impression:               - White specked mucosa in the esophagus. Biopsied.                           - Z-line regular, 38 cm from the incisors.                           - Normal mucosa was found in the entire stomach.  Biopsied.                           - Normal duodenal bulb, first portion of the                            duodenum and second portion of the duodenum.                            Biopsied.  Colonoscopy 5/29 Pending  Antibiotics: Anti-infectives (From admission, onward)   Start     Dose/Rate Route Frequency Ordered Stop   10/25/19 1415  amoxicillin (AMOXIL) capsule 500 mg     500 mg Oral Every 8 hours 10/25/19 1405     10/24/19 1800  nitrofurantoin (macrocrystal-monohydrate) (MACROBID) capsule 100 mg  Status:  Discontinued     100 mg Oral Every 12 hours 10/24/19 1701 10/25/19 1405      Subjective/Interval History: Patient states that she is feeling well.  She got a little nauseated with the prep for the colonoscopy.  But did not have any vomiting.  Denies abdominal pain.  No chest pain shortness of breath or  palpitations.  .    ROS: Denies any blood in stool.    Assessment/Plan:  Symptomatic microcytic anemia Significant drop in hemoglobin to around 7 noted from her usual baseline of 12-13.  Patient was transfused 1 unit of blood due to her symptoms.  She did notice dark to black-colored stool recently.  Denies any fresh red blood per rectum.  She is on Plavix for her heart disease.  Last colonoscopy was in 2014.  No concerning findings were noted per patient.  Anemia panel shows iron deficiency.  CT abdomen showed diverticulosis.  No other concerning findings noted. Patient seen by gastroenterology.  Initially plan was for upper endoscopy on 5/27 however due to electrolyte abnormalities and new onset of atrial fibrillation this was postponed to the following day. She underwent upper endoscopy yesterday.  No bleeding lesions were noted.  White speckled mucosa was noted in the esophagus. Plan is for colonoscopy today.  Patient was transfused additional unit of PRBC yesterday.  Hemoglobin has responded appropriately.  Paroxysmal atrial fibrillation This is a new diagnosis for her.  She has had palpitations on and off for the past 2 months.  TSH was 2.15.  Echocardiogram shows normal systolic function.  Trivial mitral regurgitation noted.  However more significantly has severe aortic stenosis noted.   Patient was started back on her beta-blocker.  She converted to sinus rhythm.  Electrolytes have been corrected.  Her chads 2 vascular score is 6.  She is high risk for thromboembolism.  Anticoagulation was discussed with her.  We will also need to wait and see what her GI work-up is before initiating anticoagulation.  She is agreeable to start Eliquis if GI has no objection. All of this was also communicated to her primary cardiologist, Dr. Irish Lack.  Severe aortic stenosis She had moderate aortic stenosis when echocardiogram was done in 2019.  Seems to have progressed.  Now she also has been found to  have atrial fibrillation as discussed above.  She will need to see her primary cardiologist, Dr. Irish Lack, in the outpatient setting.  Discussed with him.  He agrees with plans for anticoagulation.  Patient will need repeat echocardiogram in the next few months.  Hypokalemia This has been corrected.  Magnesium level is also corrected.  History of recurrent UTI Apparently followed by ID though I did not find any progress notes in the computer.  Unclear if the patient is on chronic suppressive treatment or not.  Was apparently on nitrofurantoin for 10 days and was stopped on May 16.  Urine culture is growing Enterococcus faecalis sensitive to nitrofurantoin.  She was noted to have leukocytosis for which there was no other etiology found.  Patient currently on amoxicillin.  Initially started back on nitrofurantoin however it was felt that since he recently completed a regimen of the same week may not have been effective in clearing the infection.  Leukocytosis, chronic She is followed by hematology/oncology for chronic leukocytosis which is thought to be due to rheumatoid arthritis.  Hematology notes reviewed.    History of GERD Continue PPI  History of coronary artery disease status post MI status post drug-eluting stent in 2004  Patient on Plavix at home.  This is currently being held due to concern for GI bleed.  May need to stop Plavix if the patient is started on anticoagulation.  Diabetes mellitus type 2, controlled Continue SSI.  HbA1c 7.2.  Also on Levemir.  Low glucose levels noted this morning.  Likely because of n.p.o. status.  Insulin has been discontinued for now.  Essential hypertension Blood pressure stable for the most part.  Continue to monitor.  History of rheumatoid arthritis/polymyalgia rheumatica Patient on methotrexate and prednisone at home.  Methotrexate on hold currently.  Prednisone being continued.  History of spinal stenosis Managed with epidural  injections  Obesity Estimated body mass index is 30.48 kg/m as calculated from the following:   Height as of this encounter: 5\' 2"  (1.575 m).   Weight as of this encounter: 75.6 kg.   DVT Prophylaxis: SCDs Code Status: Full code Family Communication: Discussed with the patient.  Previously discussed with her husband and daughter. Disposition Plan:  Status is: Inpatient  The patient will require care spanning > 2 midnights and should be moved to inpatient because: Ongoing diagnostic testing needed not appropriate for outpatient work up  Dispo: The patient is from: Home              Anticipated d/c is to: Home              Anticipated d/c date is: Possibly on 5/30              Patient currently is not medically stable to d/c.       Medications:  Scheduled: . amoxicillin  500 mg Oral Q8H  . atorvastatin  40 mg Oral QHS  . estradiol  1 Applicatorful Vaginal Daily  . folic acid  2 mg Oral Daily  . insulin aspart  0-9 Units Subcutaneous Q4H  . [START ON 10/27/2019] insulin detemir  14 Units Subcutaneous Daily  . metoprolol tartrate  50 mg Oral BID  . pantoprazole (PROTONIX) IV  40 mg Intravenous Q12H  . predniSONE  4 mg Oral Q breakfast  . sodium chloride flush  3 mL Intravenous Q12H   Continuous:  [id number] **OR** acetaminophen, albuterol, metoprolol tartrate, ondansetron **OR** ondansetron (ZOFRAN) IV   Objective:  Vital Signs  Vitals:   10/25/19 1548 10/25/19 2055 10/26/19 0431 10/26/19 0932  BP: (!) 145/59 (!) 153/59 (!) 132/50 (!) 143/53  Pulse: 75 85 78 76  Resp: 19 20 18    Temp: 98.2 F (36.8 C) 98.9 F (37.2 C) 98.5 F (36.9 C) 98 F (36.7 C)  TempSrc: Oral Oral Oral Oral  SpO2: 94% 96% 92% 94%  Weight:      Height:        Intake/Output Summary (Last 24 hours) at 10/26/2019 1041 Last data filed at 10/26/2019 0600 Gross per 24 hour  Intake 1948 ml  Output 650 ml  Net 1298 ml   Filed Weights   10/22/19 1702 10/23/19 1252  Weight: 78 kg  75.6 kg    General appearance: Awake alert.  In no distress Resp: Clear to auscultation bilaterally.  Normal effort Cardio: S1-S2 is normal regular.  No S3-S4.  No rubs murmurs or bruit GI: Abdomen is soft.  Nontender nondistended.  Bowel sounds are present normal.  No masses organomegaly Extremities: No edema.  Full range of motion of lower extremities. Neurologic: Alert and oriented x3.  No focal neurological deficits.      Lab Results:  Data Reviewed: I have personally reviewed following labs and imaging studies  CBC: Recent Labs  Lab 10/22/19 1720 10/22/19 2158 10/23/19 0841 10/23/19 1657 10/24/19 0326 10/25/19 0356 10/26/19 0354  WBC 12.4*  --   --   --  18.4* 15.8* 15.3*  HGB 6.9*   < > 8.3* 8.0* 8.1* 7.4* 8.9*  HCT 24.0*  --  28.1* 27.0* 28.1* 25.5* 29.3*  MCV 77.4*  --   --   --  78.3* 78.5* 78.1*  PLT 458*  --   --   --  431* 404* 396   < > = values in this interval not displayed.    Basic Metabolic Panel: Recent Labs  Lab 10/22/19 1720 10/24/19 0326 10/24/19 1643 10/25/19 0356 10/26/19 0354  NA 139 138 137 138 138  K 4.3 2.9* 4.4 4.3 4.0  CL 105 105 105 106 106  CO2 23 22 22 23 23   GLUCOSE 217* 126* 152* 103* 97  BUN 18 7* 9 11 9   CREATININE 1.10* 0.95 0.93 1.02* 0.91  CALCIUM 9.0 8.9 8.3* 8.8* 8.8*  MG  --  1.5*  --  2.0 1.7    GFR: Estimated Creatinine Clearance: 43.8 mL/min (by C-G formula based on SCr of 0.91 mg/dL).  Liver Function Tests: Recent Labs  Lab 10/22/19 1720  AST 14*  ALT 13  ALKPHOS 44  BILITOT 0.6  PROT 6.5  ALBUMIN 3.4*    HbA1C: No results for input(s): HGBA1C in the last 72 hours.  CBG: Recent Labs  Lab 10/25/19 1633 10/25/19 2005 10/26/19 0006 10/26/19 0420 10/26/19 0720  GLUCAP 196* 153* 86 86 79      Recent Results (from the past 240 hour(s))  SARS Coronavirus 2 by RT PCR (hospital order, performed in [facility] hospital lab) Nasopharyngeal Nasopharyngeal Swab     Status: None   Collection Time:  10/22/19  8:21 PM   Specimen: Nasopharyngeal Swab  Result Value Ref Range Status   SARS Coronavirus 2 NEGATIVE NEGATIVE Final    Comment: (NOTE) SARS-CoV-2 target nucleic acids are NOT DETECTED. The SARS-CoV-2 RNA is generally detectable in upper and lower respiratory specimens during the acute phase of infection. The lowest concentration of SARS-CoV-2 viral copies this assay can detect is 250 copies / mL. A negative result does not preclude SARS-CoV-2 infection and should not be used as the sole basis for treatment or other patient management decisions.  A negative result may occur with improper specimen collection / handling, submission of specimen other than nasopharyngeal swab, presence of viral mutation(s) within the areas targeted by this assay, and inadequate number of  viral copies (<250 copies / mL). A negative result must be combined with clinical observations, patient history, and epidemiological information. Fact Sheet for Patients:   StrictlyIdeas.no Fact Sheet for Healthcare Providers: BankingDealers.co.za This test is not yet approved or cleared  by the [place] FDA and has been authorized for detection and/or diagnosis of SARS-CoV-2 by FDA under an Emergency Use Authorization (EUA).  This EUA will remain in effect (meaning this test can be used) for the duration of the COVID-19 declaration under Section 564(b)(1) of the Act, 21 U.S.C. section 360bbb-3(b)(1), unless the authorization is terminated or revoked sooner. Performed at [facility], [place] [address], [place], [place] [address]   Urine culture     Status: Abnormal   Collection Time: 10/23/19  8:24 AM   Specimen: Urine, Random  Result Value Ref Range Status   Specimen Description URINE, RANDOM  Final   Special Requests   Final    NONE Performed at [facility], [place] [address] [place], [place] [address]    Culture >=100,000 COLONIES/mL  ENTEROCOCCUS FAECALIS (A)  Final   Report Status 10/25/2019 FINAL  Final   Organism ID, Bacteria ENTEROCOCCUS FAECALIS (A)  Final      Susceptibility   Enterococcus faecalis - MIC*    AMPICILLIN <=2 SENSITIVE Sensitive     NITROFURANTOIN <=16 SENSITIVE Sensitive     VANCOMYCIN 1 SENSITIVE Sensitive     * >=100,000 COLONIES/mL ENTEROCOCCUS FAECALIS      Radiology Studies: ECHOCARDIOGRAM COMPLETE  Result Date: 10/24/2019    ECHOCARDIOGRAM REPORT   Patient Name:   Sophia Ward Date of Exam: 10/24/2019 Medical Rec #:  [id number]          Height:       62.0 in Accession #:    [id number]         Weight:       166.7 lb Date of Birth:  1932-10-09         BSA:          1.769 m Patient Age:    38 years           BP:           111/70 mmHg Patient Gender: F                  HR:           87 bpm. Exam Location:  Inpatient Procedure: 2D Echo Indications:    Atrial Fibrillation 427.31 / I48.91  History:        Patient has prior history of Echocardiogram examinations, most                 recent 02/02/2018. CAD and Previous Myocardial Infarction, Mitral                 Valve Disease; Risk Factors:Hypertension, Dyslipidemia and                 Diabetes.  Sonographer:    [name] RDCS Referring Phys: [place]  1. Left ventricular ejection fraction, by estimation, is 60 to 65%. The left ventricle has normal function. The left ventricle has no regional wall motion abnormalities. Left ventricular diastolic function could not be evaluated. Elevated left ventricular end-diastolic pressure.  2. Right ventricular systolic function is normal. The right ventricular size is normal. There is moderately elevated pulmonary artery systolic pressure.  3. Left atrial size was mildly dilated.  4. The mitral  valve is abnormal. Trivial mitral valve regurgitation.  5. The aortic valve is tricuspid. Aortic valve regurgitation is trivial. Severe aortic valve stenosis. Aortic valve area, by VTI measures 0.76  cm. Aortic valve mean gradient measures 32.0 mmHg. Aortic valve Vmax measures 3.77 m/s.  6. The inferior vena cava is dilated in size with <50% respiratory variability, suggesting right atrial pressure of 15 mmHg. Comparison(s): Changes from prior study are noted. 02/02/2018: LVEF 60-65%, moderate AS - mean gradient 22 mmHg. FINDINGS  Left Ventricle: Left ventricular ejection fraction, by estimation, is 60 to 65%. The left ventricle has normal function. The left ventricle has no regional wall motion abnormalities. The left ventricular internal cavity size was normal in size. There is  no left ventricular hypertrophy. Left ventricular diastolic function could not be evaluated due to indeterminate diastolic function. Left ventricular diastolic function could not be evaluated. Elevated left ventricular end-diastolic pressure. Right Ventricle: The right ventricular size is normal. No increase in right ventricular wall thickness. Right ventricular systolic function is normal. There is moderately elevated pulmonary artery systolic pressure. The tricuspid regurgitant velocity is 2.80 m/s, and with an assumed right atrial pressure of 15 mmHg, the estimated right ventricular systolic pressure is [id number] mmHg. Left Atrium: Left atrial size was mildly dilated. Right Atrium: Right atrial size was normal in size. Pericardium: There is no evidence of pericardial effusion. Mitral Valve: The mitral valve is abnormal. There is mild calcification of the posterior mitral valve leaflet(s). Mild to moderate mitral annular calcification. Trivial mitral valve regurgitation. Tricuspid Valve: The tricuspid valve is grossly normal. Tricuspid valve regurgitation is mild. Aortic Valve: The aortic valve is tricuspid. Aortic valve regurgitation is trivial. Severe aortic stenosis is present. Aortic valve mean gradient measures 32.0 mmHg. Aortic valve peak gradient measures 56.9 mmHg. Aortic valve area, by VTI measures 0.76 cm. Pulmonic Valve: The  pulmonic valve was normal in structure. Pulmonic valve regurgitation is trivial. Aorta: The aortic root and ascending aorta are structurally normal, with no evidence of dilitation. Venous: The inferior vena cava is dilated in size with less than 50% respiratory variability, suggesting right atrial pressure of 15 mmHg. IAS/Shunts: No atrial level shunt detected by color flow Doppler.  LEFT VENTRICLE PLAX 2D LVIDd:         4.50 cm  Diastology LVIDs:         3.20 cm  LV e' lateral:   4.62 cm/s LV PW:         1.00 cm  LV E/e' lateral: 27.2 LV IVS:        1.00 cm  LV e' medial:    4.70 cm/s LVOT diam:     2.00 cm  LV E/e' medial:  26.7 LV SV:         54 LV SV Index:   31 LVOT Area:     3.14 cm  RIGHT VENTRICLE RV S prime:     8.51 cm/s TAPSE (M-mode): 1.4 cm LEFT ATRIUM             Index       RIGHT ATRIUM           Index LA diam:        4.00 cm 2.26 cm/m  RA Area:     16.10 cm LA Vol (A2C):   44.4 ml 25.10 ml/m RA Volume:   41.80 ml  23.63 ml/m LA Vol (A4C):   63.4 ml 35.84 ml/m LA Biplane Vol: 54.5 ml 30.81 ml/m  AORTIC VALVE AV Area (  Vmax):    0.80 cm AV Area (Vmean):   0.70 cm AV Area (VTI):     0.76 cm AV Vmax:           377.00 cm/s AV Vmean:          272.000 cm/s AV VTI:            0.713 m AV Peak Grad:      56.9 mmHg AV Mean Grad:      32.0 mmHg LVOT Vmax:         95.80 cm/s LVOT Vmean:        60.800 cm/s LVOT VTI:          0.172 m LVOT/AV VTI ratio: 0.24  AORTA Ao Root diam: 2.80 cm MITRAL VALVE                TRICUSPID VALVE MV Area (PHT): 3.90 cm     TR Peak grad:   31.4 mmHg MV Decel Time: 195 msec     TR Vmax:        280.00 cm/s MV E velocity: 125.50 cm/s MV A velocity: 62.25 cm/s   SHUNTS MV E/A ratio:  2.02         Systemic VTI:  0.17 m                             Systemic Diam: 2.00 cm Lyman Bishop MD Electronically signed by Lyman Bishop MD Signature Date/Time: 10/24/2019/5:10:34 PM    Final        LOS: 3 days   [facility] Pager on [email or web address]  10/26/2019,  10:41 AM

## 2019-10-26 NOTE — Progress Notes (Signed)
[facility] Gastroenterology Progress Note  Sophia Ward 86 y.o. [birth year]  CC: Anemia, dark stools   Subjective: Patient seen and examined at bedside in endoscopy unit.  Had some issues with prep but see was able to finish most of the prep.  Denied seeing any blood in the stool during the prep.  Complaining of mild abdominal discomfort    Objective: Vital signs in last 24 hours: Vitals:   10/26/19 0932 10/26/19 1133  BP: (!) 143/53 (!) 173/59  Pulse: 76 83  Resp:  (!) 27  Temp: 98 F (36.7 C) 99.5 F (37.5 C)  SpO2: 94% 95%    Physical Exam:  General.  Elderly patient.  Not in acute distress Abdomen.  Soft, nontender, nondistended, bowel sounds present.  No peritoneal signs Neuro.  Alert/oriented x3  Lab Results: Recent Labs    10/25/19 0356 10/26/19 0354  NA 138 138  K 4.3 4.0  CL 106 106  CO2 23 23  GLUCOSE 103* 97  BUN 11 9  CREATININE 1.02* 0.91  CALCIUM 8.8* 8.8*  MG 2.0 1.7   No results for input(s): AST, ALT, ALKPHOS, BILITOT, PROT, ALBUMIN in the last 72 hours. Recent Labs    10/25/19 0356 10/26/19 0354  WBC 15.8* 15.3*  HGB 7.4* 8.9*  HCT 25.5* 29.3*  MCV 78.5* 78.1*  PLT 404* 396   No results for input(s): LABPROT, INR in the last 72 hours.    Assessment/Plan:  -Iron deficiency anemia. No overt bleeding. Occult blood negative. EGD negative 05/28 for active bleeding. -History of coronary artery disease. Plavix on hold -Polymyalgia rheumatica. -Atrial fibrillation with RVR.  May need anticoagulation.  Recommendations ------------------------- -Proceed with colonoscopy  Risks (bleeding, infection, bowel perforation that could require surgery, sedation-related changes in cardiopulmonary systems), benefits (identification and possible treatment of source of symptoms, exclusion of certain causes of symptoms), and alternatives (watchful waiting, radiographic imaging studies, empiric medical treatment)  were explained to patient and  family in detail and patient wishes to proceed.     Otis Brace MD, [place] 10/26/2019, 11:43 AM  Contact #  [phone]

## 2019-10-27 ENCOUNTER — Inpatient Hospital Stay (HOSPITAL_COMMUNITY): Payer: Medicare Other

## 2019-10-27 LAB — GLUCOSE, CAPILLARY
Glucose-Capillary: 132 mg/dL — ABNORMAL HIGH (ref 70–99)
Glucose-Capillary: 140 mg/dL — ABNORMAL HIGH (ref 70–99)
Glucose-Capillary: 179 mg/dL — ABNORMAL HIGH (ref 70–99)
Glucose-Capillary: 211 mg/dL — ABNORMAL HIGH (ref 70–99)

## 2019-10-27 LAB — CBC
HCT: 31.7 % — ABNORMAL LOW (ref 36.0–46.0)
Hemoglobin: 9.5 g/dL — ABNORMAL LOW (ref 12.0–15.0)
MCH: 23.5 pg — ABNORMAL LOW (ref 26.0–34.0)
MCHC: 30 g/dL (ref 30.0–36.0)
MCV: 78.5 fL — ABNORMAL LOW (ref 80.0–100.0)
Platelets: 425 10*3/uL — ABNORMAL HIGH (ref 150–400)
RBC: 4.04 MIL/uL (ref 3.87–5.11)
RDW: 20.6 % — ABNORMAL HIGH (ref 11.5–15.5)
WBC: 15 10*3/uL — ABNORMAL HIGH (ref 4.0–10.5)
nRBC: 0 % (ref 0.0–0.2)

## 2019-10-27 LAB — BASIC METABOLIC PANEL
Anion gap: 10 (ref 5–15)
BUN: 12 mg/dL (ref 8–23)
CO2: 21 mmol/L — ABNORMAL LOW (ref 22–32)
Calcium: 8.5 mg/dL — ABNORMAL LOW (ref 8.9–10.3)
Chloride: 104 mmol/L (ref 98–111)
Creatinine, Ser: 0.87 mg/dL (ref 0.44–1.00)
GFR calc Af Amer: >60 mL/min (ref >60)
GFR calc non Af Amer: >60 mL/min (ref >60)
Glucose, Bld: 142 mg/dL — ABNORMAL HIGH (ref 70–99)
Potassium: 3 mmol/L — ABNORMAL LOW (ref 3.5–5.1)
Sodium: 135 mmol/L (ref 135–145)

## 2019-10-27 MED ORDER — FUROSEMIDE 10 MG/ML IJ SOLN
40.0000 mg | Freq: Once | INTRAMUSCULAR | Status: AC
  Administered 2019-10-27: 40 mg via INTRAVENOUS
  Filled 2019-10-27: qty 4

## 2019-10-27 MED ORDER — APIXABAN 5 MG PO TABS: 5.0000 mg | ORAL_TABLET | Freq: Two times a day (BID) | ORAL | 2 refills | Status: DC

## 2019-10-27 MED ORDER — GUAIFENESIN-DM 100-10 MG/5ML PO SYRP
5.0000 mL | ORAL_SOLUTION | ORAL | Status: DC | PRN
  Administered 2019-10-27: 5 mL via ORAL
  Filled 2019-10-27: qty 5

## 2019-10-27 MED ORDER — PANTOPRAZOLE SODIUM 40 MG PO TBEC: 40.0000 mg | DELAYED_RELEASE_TABLET | Freq: Every day | ORAL | 1 refills | Status: AC

## 2019-10-27 MED ORDER — BENZONATATE 100 MG PO CAPS: 100.0000 mg | ORAL_CAPSULE | Freq: Three times a day (TID) | ORAL | 0 refills | Status: DC

## 2019-10-27 MED ORDER — AMOXICILLIN 500 MG PO CAPS: 500.0000 mg | ORAL_CAPSULE | Freq: Three times a day (TID) | ORAL | 0 refills | Status: AC

## 2019-10-27 MED ORDER — POTASSIUM CHLORIDE CRYS ER 20 MEQ PO TBCR: 40.0000 meq | EXTENDED_RELEASE_TABLET | Freq: Every day | ORAL | 0 refills | Status: DC

## 2019-10-27 MED ORDER — BENZONATATE 100 MG PO CAPS
100.0000 mg | ORAL_CAPSULE | Freq: Three times a day (TID) | ORAL | Status: DC
  Administered 2019-10-27: 100 mg via ORAL
  Filled 2019-10-27: qty 1

## 2019-10-27 MED ORDER — POTASSIUM CHLORIDE CRYS ER 20 MEQ PO TBCR
40.0000 meq | EXTENDED_RELEASE_TABLET | ORAL | Status: DC
  Administered 2019-10-27: 40 meq via ORAL
  Filled 2019-10-27 (×2): qty 2

## 2019-10-27 NOTE — Plan of Care (Signed)
  Problem: Clinical Measurements: Goal: Ability to maintain clinical measurements within normal limits will improve Outcome: Progressing   Problem: Clinical Measurements: Goal: Respiratory complications will improve Outcome: Progressing   Problem: Activity: Goal: Risk for activity intolerance will decrease Outcome: Progressing   

## 2019-10-27 NOTE — TOC Transition Note (Signed)
Transition of Care [facility]) - CM/SW Discharge Note   Patient Details  Name: Sophia Ward MRN: [id number] Date of Birth: Aug 05, 1932  Transition of Care [facility]) CM/SW Contact:  Carles Collet, RN Phone Number: 10/27/2019, 11:03 AM   Clinical Narrative:    Patient from home w spouse, married 56 years. Provided patient with Eliquis card for 30 days at no cost.    Final next level of care: Home/Self Care     Patient Goals and CMS Choice        Discharge Placement                       Discharge Plan and Services                                     Social Determinants of Health (SDOH) Interventions     Readmission Risk Interventions No flowsheet data found.

## 2019-10-27 NOTE — Progress Notes (Signed)
DISCHARGE NOTE HOME KASHONDA KASPARIAN to be discharged Home per MD order. Discussed prescriptions and follow up appointments with the patient. Prescriptions given to patient; medication list explained in detail. Patient verbalized understanding.  Skin clean, dry and intact without evidence of skin break down, no evidence of skin tears noted. IV catheter discontinued intact. Site without signs and symptoms of complications. Dressing and pressure applied. Pt denies pain at the site currently. No complaints noted.  Patient free of lines, drains, and wounds.   An After Visit Summary (AVS) was printed and given to the patient. Patient escorted via wheelchair, and discharged home via private auto.  Dolores Hoose, RN

## 2019-10-27 NOTE — Plan of Care (Signed)
  Problem: Clinical Measurements: Goal: Ability to maintain clinical measurements within normal limits will improve 10/27/2019 1424 by Dolores Hoose, RN Outcome: Adequate for Discharge 10/27/2019 1314 by Dolores Hoose, RN Outcome: Adequate for Discharge 10/27/2019 0901 by Dolores Hoose, RN Outcome: Progressing Goal: Will remain free from infection 10/27/2019 1424 by Dolores Hoose, RN Outcome: Adequate for Discharge 10/27/2019 1314 by Dolores Hoose, RN Outcome: Adequate for Discharge Goal: Diagnostic test results will improve 10/27/2019 1424 by Dolores Hoose, RN Outcome: Adequate for Discharge 10/27/2019 1314 by Dolores Hoose, RN Outcome: Adequate for Discharge Goal: Respiratory complications will improve 10/27/2019 1424 by Dolores Hoose, RN Outcome: Adequate for Discharge 10/27/2019 1314 by Dolores Hoose, RN Outcome: Adequate for Discharge 10/27/2019 0901 by Dolores Hoose, RN Outcome: Progressing Goal: Cardiovascular complication will be avoided 10/27/2019 1424 by Dolores Hoose, RN Outcome: Adequate for Discharge 10/27/2019 1314 by Dolores Hoose, RN Outcome: Adequate for Discharge

## 2019-10-27 NOTE — Discharge Summary (Signed)
[facility]  Physician Discharge Summary   Patient ID: Sophia Ward MRN: [id number] DOB/AGE: Aug 11, 1932 86 y.o.  Admit date: 10/22/2019 Discharge date: 10/28/2019  PCP: [name], MD  DISCHARGE DIAGNOSES:  Symptomatic microcytic anemia Ulcers in the terminal ileum Paroxysmal atrial fibrillation, new diagnosis Severe aortic stenosis Recurrent UTI Chronic leukocytosis History of coronary artery disease status post MI  History of GERD Diabetes mellitus type 2, controlled Essential hypertension History of rheumatoid arthritis History of polymyalgia rheumatica History of spinal stenosis  RECOMMENDATIONS FOR OUTPATIENT FOLLOW UP: 1. Outpatient follow-up with gastroenterology in 2 to 4 weeks 2. Follow-up with PCP 3. Follow-up with cardiology for new onset A. fib as well as progression of aortic stenosis 4. Patient started on Eliquis.  See below.  Plavix discontinued.    CODE STATUS: Full code  DISCHARGE CONDITION: fair  Diet recommendation: Modified carbohydrate  INITIAL HISTORY: [name] a 86 y.o.femalewith medical history significant ofGERD, CAD s/p MI s/p stent on plavix, DMII, HTN , HLD,RA on MTX, PMR on chronic prednisone, spinal stenosis managed with epidural injections who has had interim history of increase fatigue x 2-3 weeks. Patient states she followed up with her primary for routine visit and on evaluation was found to have drop in hgb to 7.9 from base of 13.4 on 10/02/18. Plan was made for continued monitoring and recheck in 2 weeks. Patient states during that time her fatigued continued and she began to have increasing shortness of breath with exertion. Patient also noted that she has increase loose stools that were dark but states this has been chronic for some time.  Due to ongoing symptoms she was asked to go to the emergency department.  Consultants: [facility] gastroenterology  Procedures:  Transthoracic echocardiogram 5/27 1.  Left ventricular ejection fraction, by estimation, is 60 to 65%. The  left ventricle has normal function. The left ventricle has no regional  wall motion abnormalities. Left ventricular diastolic function could not  be evaluated. Elevated left  ventricular end-diastolic pressure.  2. Right ventricular systolic function is normal. The right ventricular  size is normal. There is moderately elevated pulmonary artery systolic  pressure.  3. Left atrial size was mildly dilated.  4. The mitral valve is abnormal. Trivial mitral valve regurgitation.  5. The aortic valve is tricuspid. Aortic valve regurgitation is trivial.  Severe aortic valve stenosis. Aortic valve area, by VTI measures 0.76 cm.  Aortic valve mean gradient measures 32.0 mmHg. Aortic valve Vmax measures  3.77 m/s.  6. The inferior vena cava is dilated in size with <50% respiratory  variability, suggesting right atrial pressure of 15 mmHg.   Comparison(s): Changes from prior study are noted. 02/02/2018: LVEF 60-65%,  moderate AS - mean gradient 22 mmHg.   Upper endoscopy 5/28 Findings: Scattered mild mucosal changes characterized by white specks were found  in the entire esophagus. Biopsies were taken with a cold forceps for  histology. The Z-line was regular and was found 38 cm from the incisors. Normal mucosa was found in the entire examined stomach. Biopsies were  taken with a cold forceps for histology. The cardia and gastric fundus were normal on retroflexion. The duodenal bulb, first portion of the duodenum and second portion of  the duodenum were normal. Biopsies for histology were taken with a cold  forceps for evaluation of celiac disease. Impression: - White specked mucosa in the esophagus. Biopsied. - Z-line regular, 38 cm from the incisors. - Normal mucosa was found in the entire stomach.  Biopsied. - Normal duodenal bulb, first portion of the  duodenum and second portion of the duodenum.  Biopsied.  Colonoscopy 5/29 Findings:      Hemorrhoids were found on perianal exam.      The terminal ileum contained a few ulcers. No bleeding was present. No       stigmata of recent bleeding were seen. Biopsies were taken with a cold       forceps for histology.      Normal mucosa was found in the entire colon. Biopsies were taken with a       cold forceps for histology.      A few diverticula were found in the sigmoid colon.      Internal hemorrhoids were found during retroflexion. The hemorrhoids       were large. Impression:               - Preparation of the colon was fair.                           - Hemorrhoids found on perianal exam.                           - A few ulcers in the terminal ileum. Biopsied.                           - Normal mucosa in the entire examined colon.                            Biopsied.                           - Diverticulosis in the sigmoid colon.                           - Internal hemorrhoids. Recommendation:           - Return patient to hospital ward for ongoing care.                           - Cardiac diet.                           - Continue present medications.                           - Await pathology results.    HOSPITAL COURSE:   Symptomatic microcytic anemia Significant drop in hemoglobin to around 7 noted from her usual baseline of 12-13.  Patient was transfused 1 unit of blood due to her symptoms.  She did notice dark to black-colored stool recently.  Denies any fresh red blood per rectum.  She is on Plavix for her heart disease.  Last colonoscopy was in 2014.  No concerning findings were noted per patient.  Anemia panel shows iron deficiency.  CT abdomen showed diverticulosis.  No other concerning findings  noted. Patient seen by gastroenterology.  Initially plan was for upper endoscopy on 5/27 however due to electrolyte abnormalities and new onset of atrial fibrillation this was postponed to the following day. She underwent upper endoscopy.  No bleeding lesions were noted.  White speckled  mucosa was noted in the esophagus. Subsequently she underwent colonoscopy.  Few ulcers were noted in the terminal ileum.  No active bleeding was noted. Cleared by gastroenterology to start anticoagulation.  Follow-up in their office in 2 to 4 weeks.  PPI.  Paroxysmal atrial fibrillation This is a new diagnosis for her.  She has had palpitations on and off for the past 2 months.  TSH was 2.15.  Echocardiogram shows normal systolic function.  Trivial mitral regurgitation noted.  However more significantly has severe aortic stenosis noted.   Patient was started back on her beta-blocker.  She converted to sinus rhythm.  Electrolytes have been corrected.  Her chads 2 vascular score is 6.  She is high risk for thromboembolism.  Anticoagulation was discussed with her.  She is agreeable to start Eliquis if GI has no objection.  Patient was started on Eliquis. All of this was also communicated to her primary cardiologist, Dr. Irish Lack.  Severe aortic stenosis She had moderate aortic stenosis when echocardiogram was done in 2019.  Seems to have progressed based on echocardiogram done during this hospital stay..  Now she also has been found to have atrial fibrillation as discussed above.  She will need to see her primary cardiologist, Dr. Irish Lack, in the outpatient setting.  Discussed with him.  He agrees with plans for anticoagulation.  Patient will need repeat echocardiogram in the next few months.  Hypokalemia This has been corrected.  Magnesium level is also corrected.  History of recurrent UTI Apparently followed by ID though I did not find any progress notes in the computer.  Unclear if the patient is on chronic  suppressive treatment or not.  Was apparently on nitrofurantoin for 10 days and was stopped on May 16.  Urine culture is growing Enterococcus faecalis sensitive to nitrofurantoin.  She was noted to have leukocytosis for which there was no other etiology found.  Patient currently on amoxicillin.  Initially started back on nitrofurantoin however it was felt that since she recently completed a regimen of the same last week it may not have been effective in clearing the infection.  Leukocytosis, chronic She is followed by hematology/oncology for chronic leukocytosis which is thought to be due to rheumatoid arthritis.  Hematology notes reviewed.    History of GERD Continue PPI  History of coronary artery disease status post MI status post drug-eluting stent in 2004  Patient on Plavix at home.  This is currently being held due to concern for GI bleed.  Plavix to be stopped since she will now be started on Eliquis.  Diabetes mellitus type 2, controlled HbA1c 7.2.    Continue home medication regimen  Essential hypertension Blood pressure stable for the most part.    History of rheumatoid arthritis/polymyalgia rheumatica Resume methotrexate and prednisone.  History of spinal stenosis Managed with epidural injections  Obesity Estimated body mass index is 30.48 kg/m as calculated from the following:   Height as of this encounter: 5\' 2"  (1.575 m).   Weight as of this encounter: 75.6 kg.  This morning patient was complaining of a cough.  She was noted to be mildly hypoxic last night and was placed on oxygen.  Chest x-ray was done which did not show any acute findings.  Patient will be on antibiotics anyway.  She was given Lasix as she was transfused PRBCs.  She was reevaluated in the afternoon.  She ambulated.  Did not drop her saturations on RA.  Feeling better.  Wishes to go home.  Seems to be okay for discharge at this time.    PERTINENT LABS:  The results of significant diagnostics  from this hospitalization (including imaging, microbiology, ancillary and laboratory) are listed below for reference.    Microbiology: Recent Results (from the past 240 hour(s))  SARS Coronavirus 2 by RT PCR (hospital order, performed in [facility] hospital lab) Nasopharyngeal Nasopharyngeal Swab     Status: None   Collection Time: 10/22/19  8:21 PM   Specimen: Nasopharyngeal Swab  Result Value Ref Range Status   SARS Coronavirus 2 NEGATIVE NEGATIVE Final    Comment: (NOTE) SARS-CoV-2 target nucleic acids are NOT DETECTED. The SARS-CoV-2 RNA is generally detectable in upper and lower respiratory specimens during the acute phase of infection. The lowest concentration of SARS-CoV-2 viral copies this assay can detect is 250 copies / mL. A negative result does not preclude SARS-CoV-2 infection and should not be used as the sole basis for treatment or other patient management decisions.  A negative result may occur with improper specimen collection / handling, submission of specimen other than nasopharyngeal swab, presence of viral mutation(s) within the areas targeted by this assay, and inadequate number of viral copies (<250 copies / mL). A negative result must be combined with clinical observations, patient history, and epidemiological information. Fact Sheet for Patients:   StrictlyIdeas.no Fact Sheet for Healthcare Providers: BankingDealers.co.za This test is not yet approved or cleared  by the [place] FDA and has been authorized for detection and/or diagnosis of SARS-CoV-2 by FDA under an Emergency Use Authorization (EUA).  This EUA will remain in effect (meaning this test can be used) for the duration of the COVID-19 declaration under Section 564(b)(1) of the Act, 21 U.S.C. section 360bbb-3(b)(1), unless the authorization is terminated or revoked sooner. Performed at [facility], [place] [address] [place],  [place] [address]   Urine culture     Status: Abnormal   Collection Time: 10/23/19  8:24 AM   Specimen: Urine, Random  Result Value Ref Range Status   Specimen Description URINE, RANDOM  Final   Special Requests   Final    NONE Performed at [facility], [place] [address], [place], [place] [address]    Culture >=100,000 COLONIES/mL ENTEROCOCCUS FAECALIS (A)  Final   Report Status 10/25/2019 FINAL  Final   Organism ID, Bacteria ENTEROCOCCUS FAECALIS (A)  Final      Susceptibility   Enterococcus faecalis - MIC*    AMPICILLIN <=2 SENSITIVE Sensitive     NITROFURANTOIN <=16 SENSITIVE Sensitive     VANCOMYCIN 1 SENSITIVE Sensitive     * >=100,000 COLONIES/mL ENTEROCOCCUS FAECALIS     Labs:    Basic Metabolic Panel: Recent Labs  Lab 10/24/19 0326 10/24/19 1643 10/25/19 0356 10/26/19 0354 10/27/19 0618  NA 138 137 138 138 135  K 2.9* 4.4 4.3 4.0 3.0*  CL 105 105 106 106 104  CO2 22 22 23 23  21*  GLUCOSE 126* 152* 103* 97 142*  BUN 7* 9 11 9 12   CREATININE 0.95 0.93 1.02* 0.91 0.87  CALCIUM 8.9 8.3* 8.8* 8.8* 8.5*  MG 1.5*  --  2.0 1.7  --    Liver Function Tests: Recent Labs  Lab 10/22/19 1720  AST 14*  ALT 13  ALKPHOS 44  BILITOT 0.6  PROT 6.5  ALBUMIN 3.4*   CBC: Recent Labs  Lab 10/22/19 1720 10/22/19 2158 10/23/19 1657 10/24/19 0326 10/25/19 0356 10/26/19 0354 10/27/19 0618  WBC 12.4*  --   --  18.4* 15.8* 15.3* 15.0*  HGB 6.9*   < > 8.0* 8.1* 7.4* 8.9* 9.5*  HCT 24.0*   < > 27.0* 28.1* 25.5* 29.3* 31.7*  MCV 77.4*  --   --  78.3* 78.5* 78.1* 78.5*  PLT 458*  --   --  431* 404* 396 425*   < > = values in this interval not displayed.    CBG: Recent Labs  Lab 10/26/19 2100 10/27/19 0025 10/27/19 0512 10/27/19 0732 10/27/19 1114  GLUCAP 152* 179* 140* 132* 211*     IMAGING STUDIES CT ABDOMEN PELVIS WO CONTRAST  Result Date: 10/22/2019 CLINICAL DATA:  86 year old with symptomatic anemia. EXAM: CT ABDOMEN AND PELVIS WITHOUT CONTRAST  TECHNIQUE: Multidetector CT imaging of the abdomen and pelvis was performed following the standard protocol without IV contrast. COMPARISON:  CT 05/04/2012 FINDINGS: Lower chest: Mild cardiomegaly. Mitral annulus calcifications. Small right pleural effusion and basilar atelectasis. Trace left pleural thickening. No pericardial effusion. Lower lobe bronchial thickening. Hepatobiliary: Previous hepatic steatosis is less prominent on the current exam. No evidence of focal hepatic lesion allowing for lack of IV contrast. Punctate granuloma in the left lobe. Gallbladder is unremarkable. Pancreas: No ductal dilatation or inflammation. Spleen: Normal in size without focal abnormality. Adrenals/Urinary Tract: Left adrenal thickening without dominant nodule. Normal right adrenal gland. No hydronephrosis. Minor symmetric perinephric edema. Slight prominence of the renal pelvis ease without frank hydronephrosis, also unchanged. No renal or ureteral calculi. Urinary bladder is partially distended. No bladder wall thickening. Stomach/Bowel: Small hiatal hernia. Stomach is decompressed and not well evaluated. No small bowel obstruction or evidence of inflammation. Surgical clips at the base of the cecum from presumed appendectomy. Appendix not seen. Moderate volume of stool throughout the colon. Mild distal colonic diverticulosis without diverticulitis. No colonic wall thickening. No obvious colonic mass. Vascular/Lymphatic: Small clustered ileocolic nodes measuring up to 6 mm. A few prominent central mesenteric nodes that are not enlarged by size criteria. Aortic and branch atherosclerosis. No aortic aneurysm. Reproductive: Status post hysterectomy. No adnexal masses. Other: No free air or free fluid. Patchy soft tissue densities in the anterior abdominal wall typical of medication injection sites. No evidence of omental disease or intra-abdominal mass. Musculoskeletal: Multilevel degenerative change in the lumbar spine. No  focal bone lesion or acute osseous abnormality. IMPRESSION: 1. No explanation for anemia. 2. Small clustered ileocolic nodes measuring up to 6 mm, nonspecific but likely reactive. 3. Mild distal colonic diverticulosis without diverticulitis. 4. Lung base findings include mild cardiomegaly. Small right pleural effusion and basilar atelectasis. Trace left pleural thickening. Lower lobe bronchial thickening. Aortic Atherosclerosis (ICD10-I70.0). Electronically Signed   By: Keith Rake M.D.   On: 10/22/2019 21:58   DG CHEST PORT 1 VIEW  Result Date: 10/27/2019 CLINICAL DATA:  Cough. EXAM: PORTABLE CHEST 1 VIEW COMPARISON:  10/23/2019 FINDINGS: Stable mild cardiomegaly. Aortic atherosclerosis noted. Both lungs are clear. IMPRESSION: Stable mild cardiomegaly. No active lung disease. Electronically Signed   By: Marlaine Hind M.D.   On: 10/27/2019 08:52   DG Chest Port 1 View  Result Date: 10/23/2019 CLINICAL DATA:  Low hemoglobin levels. Cough. EXAM: PORTABLE CHEST 1 VIEW COMPARISON:  11/07/2016 FINDINGS: Aortic atherosclerosis. Moderate cardiac enlargement. No pleural effusion or edema identified. No airspace densities. The visualized osseous structures appear intact. IMPRESSION: No acute cardiopulmonary abnormalities. Aortic Atherosclerosis (ICD10-I70.0). Electronically Signed   By: Kerby Moors M.D.   On: 10/23/2019 09:55   ECHOCARDIOGRAM COMPLETE  Result Date: 10/24/2019    ECHOCARDIOGRAM REPORT  Patient Name:   SHARLETTE FERNSTROM Date of Exam: 10/24/2019 Medical Rec #:  [id number]          Height:       62.0 in Accession #:    [id number]         Weight:       166.7 lb Date of Birth:  1932-06-01         BSA:          1.769 m Patient Age:    55 years           BP:           111/70 mmHg Patient Gender: F                  HR:           87 bpm. Exam Location:  Inpatient Procedure: 2D Echo Indications:    Atrial Fibrillation 427.31 / I48.91  History:        Patient has prior history of Echocardiogram  examinations, most                 recent 02/02/2018. CAD and Previous Myocardial Infarction, Mitral                 Valve Disease; Risk Factors:Hypertension, Dyslipidemia and                 Diabetes.  Sonographer:    [name] RDCS Referring Phys: [place]  1. Left ventricular ejection fraction, by estimation, is 60 to 65%. The left ventricle has normal function. The left ventricle has no regional wall motion abnormalities. Left ventricular diastolic function could not be evaluated. Elevated left ventricular end-diastolic pressure.  2. Right ventricular systolic function is normal. The right ventricular size is normal. There is moderately elevated pulmonary artery systolic pressure.  3. Left atrial size was mildly dilated.  4. The mitral valve is abnormal. Trivial mitral valve regurgitation.  5. The aortic valve is tricuspid. Aortic valve regurgitation is trivial. Severe aortic valve stenosis. Aortic valve area, by VTI measures 0.76 cm. Aortic valve mean gradient measures 32.0 mmHg. Aortic valve Vmax measures 3.77 m/s.  6. The inferior vena cava is dilated in size with <50% respiratory variability, suggesting right atrial pressure of 15 mmHg. Comparison(s): Changes from prior study are noted. 02/02/2018: LVEF 60-65%, moderate AS - mean gradient 22 mmHg. FINDINGS  Left Ventricle: Left ventricular ejection fraction, by estimation, is 60 to 65%. The left ventricle has normal function. The left ventricle has no regional wall motion abnormalities. The left ventricular internal cavity size was normal in size. There is  no left ventricular hypertrophy. Left ventricular diastolic function could not be evaluated due to indeterminate diastolic function. Left ventricular diastolic function could not be evaluated. Elevated left ventricular end-diastolic pressure. Right Ventricle: The right ventricular size is normal. No increase in right ventricular wall thickness. Right ventricular systolic function  is normal. There is moderately elevated pulmonary artery systolic pressure. The tricuspid regurgitant velocity is 2.80 m/s, and with an assumed right atrial pressure of 15 mmHg, the estimated right ventricular systolic pressure is [id number] mmHg. Left Atrium: Left atrial size was mildly dilated. Right Atrium: Right atrial size was normal in size. Pericardium: There is no evidence of pericardial effusion. Mitral Valve: The mitral valve is abnormal. There is mild calcification of the posterior mitral valve leaflet(s). Mild to moderate mitral annular calcification. Trivial mitral valve regurgitation. Tricuspid Valve: The tricuspid valve is grossly normal.  Tricuspid valve regurgitation is mild. Aortic Valve: The aortic valve is tricuspid. Aortic valve regurgitation is trivial. Severe aortic stenosis is present. Aortic valve mean gradient measures 32.0 mmHg. Aortic valve peak gradient measures 56.9 mmHg. Aortic valve area, by VTI measures 0.76 cm. Pulmonic Valve: The pulmonic valve was normal in structure. Pulmonic valve regurgitation is trivial. Aorta: The aortic root and ascending aorta are structurally normal, with no evidence of dilitation. Venous: The inferior vena cava is dilated in size with less than 50% respiratory variability, suggesting right atrial pressure of 15 mmHg. IAS/Shunts: No atrial level shunt detected by color flow Doppler.  LEFT VENTRICLE PLAX 2D LVIDd:         4.50 cm  Diastology LVIDs:         3.20 cm  LV e' lateral:   4.62 cm/s LV PW:         1.00 cm  LV E/e' lateral: 27.2 LV IVS:        1.00 cm  LV e' medial:    4.70 cm/s LVOT diam:     2.00 cm  LV E/e' medial:  26.7 LV SV:         54 LV SV Index:   31 LVOT Area:     3.14 cm  RIGHT VENTRICLE RV S prime:     8.51 cm/s TAPSE (M-mode): 1.4 cm LEFT ATRIUM             Index       RIGHT ATRIUM           Index LA diam:        4.00 cm 2.26 cm/m  RA Area:     16.10 cm LA Vol (A2C):   44.4 ml 25.10 ml/m RA Volume:   41.80 ml  23.63 ml/m LA Vol (A4C):    63.4 ml 35.84 ml/m LA Biplane Vol: 54.5 ml 30.81 ml/m  AORTIC VALVE AV Area (Vmax):    0.80 cm AV Area (Vmean):   0.70 cm AV Area (VTI):     0.76 cm AV Vmax:           377.00 cm/s AV Vmean:          272.000 cm/s AV VTI:            0.713 m AV Peak Grad:      56.9 mmHg AV Mean Grad:      32.0 mmHg LVOT Vmax:         95.80 cm/s LVOT Vmean:        60.800 cm/s LVOT VTI:          0.172 m LVOT/AV VTI ratio: 0.24  AORTA Ao Root diam: 2.80 cm MITRAL VALVE                TRICUSPID VALVE MV Area (PHT): 3.90 cm     TR Peak grad:   31.4 mmHg MV Decel Time: 195 msec     TR Vmax:        280.00 cm/s MV E velocity: 125.50 cm/s MV A velocity: 62.25 cm/s   SHUNTS MV E/A ratio:  2.02         Systemic VTI:  0.17 m                             Systemic Diam: 2.00 cm Lyman Bishop MD Electronically signed by Lyman Bishop MD Signature Date/Time: 10/24/2019/5:10:34 PM    Final  DISCHARGE EXAMINATION: Vitals:   10/27/19 0057 10/27/19 0105 10/27/19 0523 10/27/19 0931  BP:   (!) 152/64 (!) 157/65  Pulse: 81 77 70 81  Resp:   16 18  Temp:   98.3 F (36.8 C) 98 F (36.7 C)  TempSrc:   Oral Oral  SpO2: 93% 95% 93% 95%  Weight:      Height:       General appearance: Awake alert.  In no distress Resp: Clear to auscultation bilaterally.  Normal effort Cardio: S1-S2 is normal regular.  No S3-S4.  No rubs murmurs or bruit GI: Abdomen is soft.  Nontender nondistended.  Bowel sounds are present normal.  No masses organomegaly    DISPOSITION: Home with family  Discharge Instructions    Call MD for:  difficulty breathing, headache or visual disturbances   Complete by: As directed    Call MD for:  extreme fatigue   Complete by: As directed    Call MD for:  persistant dizziness or light-headedness   Complete by: As directed    Call MD for:  persistant nausea and vomiting   Complete by: As directed    Call MD for:  severe uncontrolled pain   Complete by: As directed    Call MD for:  temperature >100.4   Complete  by: As directed    Diet - low sodium heart healthy   Complete by: As directed    Discharge instructions   Complete by: As directed    Please be sure to follow-up with your primary care provider in 1 to 2 weeks.  We will also need to follow-up with your cardiologist and the gastroenterologist.  Take your medications as prescribed.  Seek attention immediately if you notice any blood in the stool or black-colored stool or bleeding from anywhere else.  You were cared for by a hospitalist during your hospital stay. If you have any questions about your discharge medications or the care you received while you were in the hospital after you are discharged, you can call the unit and asked to speak with the hospitalist on call if the hospitalist that took care of you is not available. Once you are discharged, your primary care physician will handle any further medical issues. Please note that NO REFILLS for any discharge medications will be authorized once you are discharged, as it is imperative that you return to your primary care physician (or establish a relationship with a primary care physician if you do not have one) for your aftercare needs so that they can reassess your need for medications and monitor your lab values. If you do not have a primary care physician, you can call [phone] for a physician referral.   Increase activity slowly   Complete by: As directed         Allergies as of 10/27/2019      Reactions   Codeine Nausea And Vomiting   MAKES HER SICK   Glimepiride Other (See Comments)   Can't remember   Jardiance [empagliflozin] Other (See Comments)   Yeast   Metformin And Related Diarrhea   Penicillins Rash      Medication List    STOP taking these medications   clopidogrel 75 MG tablet Commonly known as: PLAVIX   CoQ10 200 MG Caps   CRANBERRY PO   D-Mannose 500 MG Caps   diclofenac sodium 1 % Gel Commonly known as: VOLTAREN   nitrofurantoin (macrocrystal-monohydrate)  100 MG capsule Commonly known as: MACROBID  TAKE these medications   acetaminophen 650 MG CR tablet Commonly known as: TYLENOL Take 1,300 mg by mouth at bedtime.   amLODipine 5 MG tablet Commonly known as: NORVASC Take 2 tablets (10 mg total) by mouth every morning.   amoxicillin 500 MG capsule Commonly known as: AMOXIL Take 1 capsule (500 mg total) by mouth 3 (three) times daily for 5 days.   apixaban 5 MG Tabs tablet Commonly known as: ELIQUIS Take 1 tablet (5 mg total) by mouth 2 (two) times daily.   atorvastatin 40 MG tablet Commonly known as: LIPITOR Take 1 tablet (40 mg total) by mouth at bedtime.   B-D ULTRAFINE III SHORT PEN 31G X 8 MM Misc Generic drug: Insulin Pen Needle 1 each by Other route in the morning, at noon, in the evening, and at bedtime.   benzonatate 100 MG capsule Commonly known as: TESSALON Take 1 capsule (100 mg total) by mouth 3 (three) times daily.   CALCIUM + D PO Take 1 tablet by mouth every morning.   estradiol 0.1 MG/GM vaginal cream Commonly known as: ESTRACE Place 1 Applicatorful vaginally daily.   FISH OIL PO Take 1 capsule by mouth every morning.   folic acid 1 MG tablet Commonly known as: FOLVITE Take 2 tablets (2 mg total) by mouth daily.   HumaLOG KwikPen 100 UNIT/ML KiwkPen Generic drug: insulin lispro Inject 6 Units into the skin 3 (three) times daily.   Levemir FlexTouch 100 UNIT/ML FlexPen Generic drug: insulin detemir Inject 14 Units into the skin every morning.   methotrexate 2.5 MG tablet TAKE 5 TABLETS BY MOUTH ONCE A WEEK. Caution:Chemotherapy. Protect from light. What changed:   how much to take  how to take this  when to take this  additional instructions   metoprolol succinate 100 MG 24 hr tablet Commonly known as: TOPROL-XL Take 1 tablet (100 mg total) by mouth every morning.   nitroGLYCERIN 0.4 MG SL tablet Commonly known as: NITROSTAT DISSOLVE ONE TABLET UNDER THE TONGUE EVERY 5 MINUTES AS  NEEDED FOR CHEST PAIN.  DO NOT EXCEED A TOTAL OF 3 DOSES IN 15 MINUTES What changed:   how much to take  how to take this  when to take this  reasons to take this  additional instructions   pantoprazole 40 MG tablet Commonly known as: PROTONIX Take 1 tablet (40 mg total) by mouth daily after lunch.   potassium chloride SA 20 MEQ tablet Commonly known as: KLOR-CON Take 2 tablets (40 mEq total) by mouth daily for 5 days.   predniSONE 1 MG tablet Commonly known as: DELTASONE Take 4 tablets (4 mg total) by mouth daily with breakfast.   ramipril 10 MG tablet Commonly known as: ALTACE Take 10 mg by mouth 2 (two) times daily.   Vitamin D3 125 MCG (5000 UT) Caps Take 5,000 Units by mouth daily.        Follow-up Information    [name], MD. Schedule an appointment as soon as possible for a visit in 1 week(s).   Specialty: Internal Medicine Contact information: [address] [place] [address] [phone]        Jettie Booze, MD. Schedule an appointment as soon as possible for a visit in 3 week(s).   Specialties: Cardiology, Radiology, Interventional Cardiology Contact information: [id number] [place] [address] [phone]        Otis Brace, MD. Schedule an appointment as soon as possible for a visit in  2 week(s).   Specialty: Gastroenterology Contact information: [address] [place] [address] [phone]           TOTAL DISCHARGE TIME: 58 minutes   [place]  [facility] Pager on [email or web address]  10/28/2019, 1:29 PM

## 2019-10-27 NOTE — Discharge Instructions (Addendum)
Information on my medicine - ELIQUIS (apixaban)  This medication education was reviewed with me or my healthcare representative as part of my discharge preparation.   Why was Eliquis prescribed for you? Eliquis was prescribed for you to reduce the risk of a blood clot forming that can cause a stroke if you have a medical condition called atrial fibrillation (a type of irregular heartbeat).  What do You need to know about Eliquis ? Take your Eliquis TWICE DAILY - one tablet in the morning and one tablet in the evening with or without food. If you have difficulty swallowing the tablet whole please discuss with your pharmacist how to take the medication safely.  Take Eliquis exactly as prescribed by your doctor and DO NOT stop taking Eliquis without talking to the doctor who prescribed the medication.  Stopping may increase your risk of developing a stroke.  Refill your prescription before you run out.  After discharge, you should have regular check-up appointments with your healthcare provider that is prescribing your Eliquis.  In the future your dose may need to be changed if your kidney function or weight changes by a significant amount or as you get older.  What do you do if you miss a dose? If you miss a dose, take it as soon as you remember on the same day and resume taking twice daily.  Do not take more than one dose of ELIQUIS at the same time to make up a missed dose.  Important Safety Information A possible side effect of Eliquis is bleeding. You should call your healthcare provider right away if you experience any of the following: ? Bleeding from an injury or your nose that does not stop. ? Unusual colored urine (red or dark brown) or unusual colored stools (red or black). ? Unusual bruising for unknown reasons. ? A serious fall or if you hit your head (even if there is no bleeding).  Some medicines may interact with Eliquis and might increase your risk of bleeding or  clotting while on Eliquis. To help avoid this, consult your healthcare provider or pharmacist prior to using any new prescription or non-prescription medications, including herbals, vitamins, non-steroidal anti-inflammatory drugs (NSAIDs) and supplements.  This website has more information on Eliquis (apixaban): http://[email or web address].com/eliquis/home    Aortic Valve Stenosis  Aortic valve stenosis is a narrowing of the aortic valve in the heart. The aortic valve opens and closes to regulate blood flow between the left side of the heart (left ventricle) and the artery that leads away from the heart (aorta). When the aortic valve becomes narrow, it is difficult for the heart to pump blood out to the body, which causes the heart to work harder. The extra work can weaken the heart muscle over time. Aortic valve stenosis can range from mild to severe. If it is not treated, it can become more severe over time and lead to heart failure. What are the causes? This condition may be caused by:  Buildup of calcium around and on the aortic valve. This can occur with aging. This is the most common cause of aortic valve stenosis.  A heart problem that developed in the womb (birth defect).  Rheumatic fever.  Radiation to the chest. What increases the risk? You may be more likely to develop this condition if:  You are older than age 27.  You were born with an abnormal bicuspid valve. What are the signs or symptoms? You may not have any symptoms until your condition becomes severe.  It may take 10-20 years for mild or moderate aortic valve stenosis to become severe. Symptoms may include:  Shortness of breath. This may get worse during physical activity.  Feeling unusually weak and tired (fatigue).  Extreme discomfort in the chest, neck, or arm during physical activity (angina).  A heartbeat that is irregular or faster than normal (palpitations).  Dizziness or fainting. This may happen when you get  physically tired or after you take certain heart medicines, such as nitroglycerin. How is this diagnosed? This condition may be diagnosed with:  A physical exam.  Echocardiogram. This is a type of imaging test that uses sound waves (ultrasound) to make images of your heart. There are two kinds of this test that may be used. ? Transthoracic echocardiogram (TTE). For this type, a wand-like tool (transducer) is moved over your chest to create ultrasound images that are recorded by a computer. ? Transesophageal echocardiogram (TEE). For this type, a flexible tube (probe) is inserted down the part of the body that moves food from your mouth to your stomach (esophagus). The heart and the esophagus are close to each other. Your health care provider will use the probe to take clear, detailed pictures of the heart.  Cardiac catheterization. For this procedure, a small, thin tube (catheter) is passed through a large vein in your neck, groin, or arm. The catheter is used to get information about arteries, structures, blood pressure, and oxygen levels in your heart.  Stress tests. These are tests that evaluate the blood supply to your heart and your heart's response to exercise. You may work with a health care provider who specializes in the heart (cardiologist) for diagnosis and treatment. How is this treated? Treatment depends on how severe your condition is and what your symptoms are. You will need to have your heart checked regularly to make sure that your condition is not getting worse or causing serious problems. Treatment may also include:  Surgery to replace your aortic valve. This is the most common treatment for aortic valve stenosis, and it is the only treatment to cure the condition. Several types of surgeries are available. The surgery may be done: ? Through a large incision over your heart (open-heart surgery). ? Through small incisions, using a flexible tube called a catheter (transcatheter  aortic valve replacement, TAVR).  Medicines that help to keep your heart rate regular.  Medicines that thin your blood (anticoagulants) to prevent blood clots.  Antibiotic medicines to help prevent infection. If your condition is mild, you may only need regular follow-up visits for monitoring. Follow these instructions at home: Lifestyle  Limit alcohol intake to no more than 1 drink a day for nonpregnant women and 2 drinks a day for men. One drink equals 12 oz of beer, 5 oz of wine, or 1 oz of hard liquor.  Do not use any products that contain nicotine or tobacco, such as cigarettes and e-cigarettes. If you need help quitting, ask your health care provider.  Work with your health care provider to manage your blood pressure and cholesterol.  Maintain a healthy weight. Eating and drinking   Eat a heart-healthy diet that includes plenty of fresh fruits and vegetables, whole grains, lean protein, and low-fat or nonfat dairy.  Limit how much caffeine you drink. Caffeine can affect your heart's rate and rhythm.  Avoid foods that are: ? High in salt (sodium), saturated fat, or sugar. ? Canned or highly processed. ? Fried.  Follow instructions from your health care provider about  any other eating or drinking restrictions. Activity  Exercise regularly and return to your normal activities as told by your health care provider. Ask your health care provider what amount and type of physical activity is safe for you. ? If your aortic valve stenosis is mild, you may only need to avoid very intense physical activity, such as heavy weight lifting. ? The more severe your aortic valve stenosis is, the more activities you may need to avoid. If you are taking blood thinners:  Before you take any medicines that contain aspirin or NSAIDs, talk with your health care provider. These medicines increase your risk for dangerous bleeding.  Take your medicine exactly as told, at the same time every  day.  Avoid activities that could cause injury or bruising, and follow instructions about how to prevent falls.  Wear a medical alert bracelet or carry a card that lists what medicines you take. General instructions  Take over-the-counter and prescription medicines only as told by your health care provider.  If you were prescribed an antibiotic, take it as told by your health care provider. Do not stop taking the antibiotic even if you start to feel better.  If you are a woman and you plan to become pregnant, talk with your health care provider before you become pregnant.  Before you have any type of medical or dental procedure or surgery, tell all health care providers that you have aortic valve stenosis. This may affect the treatment that you receive.  Keep all follow-up visits as told by your health care provider. This is important. Contact a health care provider if:  You have a fever. Get help right away if:  You develop any of the following symptoms: ? Chest pain. ? Chest tightness. ? Shortness of breath. ? Trouble breathing.  You feel light-headed.  You feel like you might faint.  Your heartbeat is irregular or faster than normal. These symptoms may represent a serious problem that is an emergency. Do not wait to see if the symptoms will go away. Get medical help right away. Call your local emergency services (911 in the [place] Do not drive yourself to the hospital. Summary  Aortic valve stenosis is a narrowing of the aortic valve in the heart. The aortic valve opens and closes to regulate blood flow between the left side of the heart (left ventricle) and the artery that leads away from the heart (aorta).  Aortic valve stenosis can range from mild to severe. If it is not treated, it can become more severe over time and lead to heart failure.  Treatment depends on how severe your condition is and what your symptoms are. You will need to have your heart checked regularly to  make sure that your condition is not getting worse or causing serious problems.  Exercise regularly and return to your normal activities as told by your health care provider. Ask your health care provider what amount and type of physical activity is safe for you. This information is not intended to replace advice given to you by your health care provider. Make sure you discuss any questions you have with your health care provider. Document Revised: 04/28/2017 Document Reviewed: 02/16/2017 Elsevier Patient Education  2020 [employer].

## 2019-10-27 NOTE — Progress Notes (Signed)
SATURATION QUALIFICATIONS: (This note is used to comply with regulatory documentation for home oxygen)  Patient Saturations on Room Air at Rest = 93-94 %  Patient Saturations on Room Air while Ambulating =91-93% :

## 2019-10-29 LAB — SURGICAL PATHOLOGY

## 2019-10-29 LAB — GLUCOSE, CAPILLARY: Glucose-Capillary: 139 mg/dL — ABNORMAL HIGH (ref 70–99)

## 2019-10-31 DIAGNOSIS — K1379 Other lesions of oral mucosa: Secondary | ICD-10-CM | POA: Diagnosis not present

## 2019-10-31 DIAGNOSIS — R05 Cough: Secondary | ICD-10-CM | POA: Diagnosis not present

## 2019-10-31 DIAGNOSIS — I480 Paroxysmal atrial fibrillation: Secondary | ICD-10-CM | POA: Diagnosis not present

## 2019-10-31 DIAGNOSIS — K922 Gastrointestinal hemorrhage, unspecified: Secondary | ICD-10-CM | POA: Diagnosis not present

## 2019-10-31 DIAGNOSIS — I350 Nonrheumatic aortic (valve) stenosis: Secondary | ICD-10-CM | POA: Diagnosis not present

## 2019-11-01 DIAGNOSIS — E875 Hyperkalemia: Secondary | ICD-10-CM | POA: Diagnosis not present

## 2019-11-01 DIAGNOSIS — D649 Anemia, unspecified: Secondary | ICD-10-CM | POA: Diagnosis not present

## 2019-11-05 ENCOUNTER — Other Ambulatory Visit: Payer: Self-pay | Admitting: *Deleted

## 2019-11-05 MED ORDER — PREDNISONE 1 MG PO TABS: 4.0000 mg | ORAL_TABLET | Freq: Every day | ORAL | 0 refills | Status: DC

## 2019-11-05 NOTE — Telephone Encounter (Signed)
Refill request received via fax  Last Visit: 09/30/2019 Next Visit: 02/05/2020  Current Dose per office note on 09/30/2019: prednisone 4 mg po daily

## 2019-11-06 DIAGNOSIS — E875 Hyperkalemia: Secondary | ICD-10-CM | POA: Diagnosis not present

## 2019-11-07 DIAGNOSIS — R300 Dysuria: Secondary | ICD-10-CM | POA: Diagnosis not present

## 2019-11-07 DIAGNOSIS — R0600 Dyspnea, unspecified: Secondary | ICD-10-CM | POA: Diagnosis not present

## 2019-11-07 DIAGNOSIS — R5383 Other fatigue: Secondary | ICD-10-CM | POA: Diagnosis not present

## 2019-11-10 NOTE — Progress Notes (Signed)
Cardiology Office Note   Date:  11/11/2019   ID:  ISYS TIETJE, DOB March 02, 1933, MRN [id number]  PCP:  [name], MD    No chief complaint on file.  Aortic stenosis  Wt Readings from Last 3 Encounters:  11/11/19 170 lb (77.1 kg)  10/26/19 166 lb 14.2 oz (75.7 kg)  09/30/19 171 lb 6.4 oz (77.7 kg)       History of Present Illness: Sophia Ward is a 86 y.o. female   With h/o CAD. She had an LAD stent placed in 2004 (drug-eluting stent for restenosis) and has done well since then.  Her PLavix was stopped due to anemiain the past. Plavix was restartedlaterafter anemia was stable.  She haschronic back problems and has received injections in the past.  She has been intolerant of metformin in the pastdue to diarrhea.  In [name] 2018, she was in the hospital in [place] and was diagnosed with PMR. She was later found to have rheumatoid arthritis. MTX was started. Prednisone is being tapered gradually.  She has chronic back pain treated with injections. She travels to [place] in the winter time.   In May 2019, she had a PMR flare. She is on chronic Prednisone.   No sx like what she had before her stent. At that time, she had DOE with minimal exertion.   Back pain limits her exercise. She does have access to a recumbent bike.   Hospital records from 5/21 showed: "Symptomatic microcytic anemia Significant drop in hemoglobin to around 7 noted from her usual baseline of 12-13. Patient was transfused 1 unit of blood due to her symptoms. She did notice dark to black-colored stool recently. Denies any fresh red blood per rectum. She is on Plavix for her heart disease. Last colonoscopy was in 2014. No concerning findings were noted per patient. Anemia panel shows iron deficiency. CT abdomen showed diverticulosis. No other concerning findings noted. Patient seen by gastroenterology. Initially plan was for upper endoscopy on 5/27 however due  to electrolyte abnormalities and new onset of atrial fibrillation this was postponed to the following day. She underwent upper endoscopy. No bleeding lesions were noted. White speckled mucosa was noted in the esophagus. Subsequently she underwent colonoscopy.  Few ulcers were noted in the terminal ileum.  No active bleeding was noted. Cleared by gastroenterology to start anticoagulation.  Follow-up in their office in 2 to 4 weeks.  PPI.  Paroxysmal atrial fibrillation This is a new diagnosis for her. She has had palpitations on and off for the past 2 months. TSH was 2.15. Echocardiogram shows normal systolic function. Trivial mitral regurgitation noted. However more significantly has severe aortic stenosis noted.  Patient was started back on her beta-blocker. She converted to sinus rhythm. Electrolytes have been corrected. Her chads 2 vascular score is 6. She is high risk for thromboembolism. Anticoagulation was discussed with her. She is agreeable to start Eliquis if GI has no objection.  Patient was started on Eliquis. All of this was also communicated to her primary cardiologist, Dr. Irish Lack.  Severe aortic stenosis She had moderate aortic stenosis when echocardiogram was done in 2019. Seems to have progressed based on echocardiogram done during this hospital stay.. Now she also has been found to have atrial fibrillation as discussed above. She will need to see her primary cardiologist, Dr. Irish Lack, in the outpatient setting. Discussed with him. He agrees with plans for anticoagulation. Patient will need repeat echocardiogram in the next few months."  Since leaving the  hospital, she has had some exertional chest tightness.  It is different than what she had before her stent.    Denies : Dizziness. Leg edema. Nitroglycerin use. Orthopnea. Palpitations. Paroxysmal nocturnal dyspnea. Shortness of breath. Syncope.   Past Medical History:  Diagnosis Date  . Allergic rhinitis    . Anemia 09/2019  . Arthritis    hands  . BPPV (benign paroxysmal positional vertigo)   . Coronary artery disease   . Coronary atherosclerosis of native coronary artery 01/30/2014  . Diabetes mellitus without complication ([place])   . Essential hypertension, benign 01/30/2014  . GERD (gastroesophageal reflux disease)   . Hypertension   . Mixed hyperlipidemia 01/30/2014  . Myocardial infarction ([place]) 2004   mild MI-no damage- had stents  . Osteopenia   . Post-menopausal   . Rheumatoid arthritis ([place])   . Spinal stenosis   . Stenosing tenosynovitis     Past Surgical History:  Procedure Laterality Date  . ABDOMINAL HYSTERECTOMY    . APPENDECTOMY    . BIOPSY  10/25/2019   Procedure: BIOPSY;  Surgeon: Otis Brace, MD;  Location: [place];  Service: Gastroenterology;;  . BIOPSY  10/26/2019   Procedure: BIOPSY;  Surgeon: Otis Brace, MD;  Location: [facility];  Service: Gastroenterology;;  . CARDIAC CATHETERIZATION  2004  . carpel tunnel    . COLONOSCOPY WITH PROPOFOL N/A 10/30/2012   Procedure: COLONOSCOPY WITH PROPOFOL;  Surgeon: Garlan Fair, MD;  Location: [facility];  Service: Endoscopy;  Laterality: N/A;  . COLONOSCOPY WITH PROPOFOL N/A 10/26/2019   Procedure: COLONOSCOPY WITH PROPOFOL;  Surgeon: Otis Brace, MD;  Location: [place];  Service: Gastroenterology;  Laterality: N/A;  . CORONARY ANGIOPLASTY  2004  . DILATION AND CURETTAGE OF UTERUS    . ESOPHAGOGASTRODUODENOSCOPY N/A 10/25/2019   Procedure: ESOPHAGOGASTRODUODENOSCOPY (EGD);  Surgeon: Otis Brace, MD;  Location[facility];  Service: Gastroenterology;  Laterality: N/A;  . ESOPHAGOGASTRODUODENOSCOPY (EGD) WITH PROPOFOL N/A 10/30/2012   Procedure: ESOPHAGOGASTRODUODENOSCOPY (EGD) WITH PROPOFOL;  Surgeon: Garlan Fair, MD;  Location: [facility];  Service: Endoscopy;  Laterality: N/A;  . EYE SURGERY     bilateral cataract with lens implant  . EYE SURGERY    . INJECTION OF SILICONE OIL  Left 10/25/4130   Procedure: Injection Of Silicone Oil;  Surgeon: Jalene Mullet, MD;  Location: [place];  Service: Ophthalmology;  Laterality: Left;  . left shouler surgery    . PHOTOCOAGULATION WITH LASER Left 02/12/2019   Procedure: Photocoagulation With Laser;  Surgeon: Jalene Mullet, MD;  Location: [place];  Service: Ophthalmology;  Laterality: Left;  . 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 02/12/2019   Procedure: REPAIR OF COMPLEX TRACTION RETINAL DETACHMENT;  Surgeon: Jalene Mullet, MD;  Location: [place];  Service: Ophthalmology;  Laterality: Left;  . TONSILLECTOMY    . VITRECTOMY 25 GAUGE WITH SCLERAL BUCKLE Left 02/12/2019   Procedure: Vitrectomy 25 Gauge With Scleral Buckle;  Surgeon: Jalene Mullet, MD;  Location: [place];  Service: Ophthalmology;  Laterality: Left;     Current Outpatient Medications  Medication Sig Dispense Refill  . acetaminophen (TYLENOL) 650 MG CR tablet Take 1,300 mg by mouth at bedtime.    Marland Kitchen amLODipine (NORVASC) 5 MG tablet Take 2 tablets (10 mg total) by mouth every morning. 90 tablet 3  . apixaban (ELIQUIS) 5 MG TABS tablet Take 1 tablet (5 mg total) by mouth 2 (two) times daily. 60 tablet 2  . atorvastatin (LIPITOR) 40 MG tablet Take 1 tablet (40 mg total) by  mouth at bedtime. 90 tablet 3  . B-D ULTRAFINE III SHORT PEN 31G X 8 MM MISC 1 each by Other route in the morning, at noon, in the evening, and at bedtime.     . benzonatate (TESSALON) 100 MG capsule Take 1 capsule (100 mg total) by mouth 3 (three) times daily. 20 capsule 0  . Calcium Carbonate-Vitamin D (CALCIUM + D PO) Take 1 tablet by mouth every morning.    . Cholecalciferol (VITAMIN D3) 125 MCG (5000 UT) CAPS Take 5,000 Units by mouth daily.     Marland Kitchen estradiol (ESTRACE) 0.1 MG/GM vaginal cream Place 1 Applicatorful vaginally daily.     . folic acid (FOLVITE) 1 MG tablet Take 2 tablets (2 mg total) by mouth daily. 180 tablet 2  . insulin lispro (HUMALOG KWIKPEN) 100 UNIT/ML KiwkPen Inject 6  Units into the skin 3 (three) times daily.     Marland Kitchen LEVEMIR FLEXTOUCH 100 UNIT/ML Pen Inject 14 Units into the skin every morning.   0  . methotrexate 2.5 MG tablet TAKE 5 TABLETS BY MOUTH ONCE A WEEK. Caution:Chemotherapy. Protect from light. 60 tablet 0  . metoprolol succinate (TOPROL-XL) 100 MG 24 hr tablet Take 1 tablet (100 mg total) by mouth every morning. 90 tablet 3  . nitroGLYCERIN (NITROSTAT) 0.4 MG SL tablet DISSOLVE ONE TABLET UNDER THE TONGUE EVERY 5 MINUTES AS NEEDED FOR CHEST PAIN.  DO NOT EXCEED A TOTAL OF 3 DOSES IN 15 MINUTES 25 tablet 5  . Omega-3 Fatty Acids (FISH OIL PO) Take 1 capsule by mouth every morning.    . pantoprazole (PROTONIX) 40 MG tablet Take 1 tablet (40 mg total) by mouth daily after lunch. 30 tablet 1  . predniSONE (DELTASONE) 1 MG tablet Take 4 tablets (4 mg total) by mouth daily with breakfast. 360 tablet 0  . ramipril (ALTACE) 10 MG tablet Take 10 mg by mouth 2 (two) times daily.      No current facility-administered medications for this visit.    Allergies:   Codeine, Glimepiride, Jardiance [empagliflozin], Metformin and related, and Penicillins    Social History:  The patient  reports that she has never smoked. She has never used smokeless tobacco. She reports that she does not drink alcohol and does not use drugs.   Family History:  The patient's family history includes Arthritis in her daughter; CAD in her father and mother; Heart Problems in her brother; Heart attack in her mother.    ROS:  Please see the history of present illness.   Otherwise, review of systems are positive for occasional exertional chest tightness.   All other systems are reviewed and negative.    PHYSICAL EXAM: VS:  BP (!) 120/52   Pulse 84   Ht 5\' 2"  (1.575 m)   Wt 170 lb (77.1 kg)   SpO2 94%   BMI 31.09 kg/m  , BMI Body mass index is 31.09 kg/m. GEN: Well nourished, well developed, in no acute distress  HEENT: normal  Neck: no JVD, carotid bruits, or masses Cardiac:  RRR; 3/6 systolic murmur, no rubs, or gallops,no edema  Respiratory:  clear to auscultation bilaterally, normal work of breathing GI: soft, nontender, nondistended, + BS MS: no deformity or atrophy  Skin: warm and dry, no rash Neuro:  Strength and sensation are intact Psych: euthymic mood, full affect    Recent Labs: 10/22/2019: ALT 13; TSH 2.153 10/26/2019: Magnesium 1.7 10/27/2019: BUN 12; Creatinine, Ser 0.87; Hemoglobin 9.5; Platelets 425; Potassium 3.0; Sodium 135  Lipid Panel No results found for: CHOL, TRIG, HDL, CHOLHDL, VLDL, LDLCALC, LDLDIRECT   Other studies Reviewed: Additional studies/ records that were reviewed today with results demonstrating: Hospital records reviewed.   ASSESSMENT AND PLAN:  1. CAD: Prior stent placement.  Plavix was stopped due to recent GI bleed.  Eliquis started for atrial fibrillation.  Some anginal sx, may be related to combination of AS, anemia.  Has not used SL NTG.  2. PAF: Cleared by GI for Eliquis.  F/u with GI in July.  No bleeding problems at this point. Will have to hold Eliquis for epidural injection and for cath. Taking iron for now.  3. Aortic stenosis: Now severe by echocardiogram.  Will need to consider cardiac cath as part of AVR work-up to determine whether there should be transcatheter or surgical.  ? Whether this could be Heyde's syndrome, causing GI bleeding in the setting of AS.  4. Hyperlipidemia: LDL 62 in 5/21. Continue statin.  5. Hypertension: The current medical regimen is effective;  continue present plan and medications.   Current medicines are reviewed at length with the patient today.  The patient concerns regarding her medicines were addressed.  The following changes have been made:  No change  Labs/ tests ordered today include:  No orders of the defined types were placed in this encounter.   Recommend 150 minutes/week of aerobic exercise Low fat, low carb, high fiber diet recommended  Disposition:   FU 2  months   Signed, Larae Grooms, MD  11/11/2019 2:01 PM    [facility] [address], [place], [place]  [address] Phone: [phone]; Fax: [phone]

## 2019-11-11 ENCOUNTER — Other Ambulatory Visit: Payer: Self-pay

## 2019-11-11 ENCOUNTER — Encounter: Payer: Self-pay | Admitting: Interventional Cardiology

## 2019-11-11 ENCOUNTER — Ambulatory Visit (INDEPENDENT_AMBULATORY_CARE_PROVIDER_SITE_OTHER): Payer: Medicare Other | Admitting: Interventional Cardiology

## 2019-11-11 VITALS — BP 120/52 | HR 84 | Ht 62.0 in | Wt 170.0 lb

## 2019-11-11 DIAGNOSIS — I350 Nonrheumatic aortic (valve) stenosis: Secondary | ICD-10-CM | POA: Diagnosis not present

## 2019-11-11 DIAGNOSIS — E782 Mixed hyperlipidemia: Secondary | ICD-10-CM | POA: Diagnosis not present

## 2019-11-11 DIAGNOSIS — I480 Paroxysmal atrial fibrillation: Secondary | ICD-10-CM | POA: Diagnosis not present

## 2019-11-11 DIAGNOSIS — I10 Essential (primary) hypertension: Secondary | ICD-10-CM | POA: Diagnosis not present

## 2019-11-11 DIAGNOSIS — I25119 Atherosclerotic heart disease of native coronary artery with unspecified angina pectoris: Secondary | ICD-10-CM | POA: Diagnosis not present

## 2019-11-11 MED ORDER — NITROGLYCERIN 0.4 MG SL SUBL: SUBLINGUAL_TABLET | SUBLINGUAL | 5 refills | Status: DC

## 2019-11-11 MED ORDER — APIXABAN 5 MG PO TABS: 5.0000 mg | ORAL_TABLET | Freq: Two times a day (BID) | ORAL | 1 refills | Status: DC

## 2019-11-11 MED ORDER — AMLODIPINE BESYLATE 10 MG PO TABS: 10.0000 mg | ORAL_TABLET | Freq: Every morning | ORAL | 3 refills | Status: DC

## 2019-11-11 NOTE — Patient Instructions (Signed)
Medication Instructions:  Your physician recommends that you continue on your current medications as directed. Please refer to the Current Medication list given to you today.  *If you need a refill on your cardiac medications before your next appointment, please call your pharmacy*   Lab Work: None  If you have labs (blood work) drawn today and your tests are completely normal, you will receive your results only by: Marland Kitchen MyChart Message (if you have MyChart) OR . A paper copy in the mail If you have any lab test that is abnormal or we need to change your treatment, we will call you to review the results.   Testing/Procedures: None   Follow-Up: Follow up with Dr. Irish Lack on 01/16/20 at 9:00 AM   Other Instructions None

## 2019-11-21 ENCOUNTER — Telehealth: Payer: Self-pay | Admitting: *Deleted

## 2019-11-21 NOTE — Telephone Encounter (Signed)
   [facility] Pre-operative Risk Assessment    HEARTCARE STAFF: - Please ensure there is not already an duplicate clearance open for this procedure. - Under Visit Info/Reason for Call, type in Other and utilize the format Clearance MM/DD/YY or Clearance TBD. Do not use dashes or single digits. - If request is for dental extraction, please clarify the # of teeth to be extracted.  Request for surgical clearance:  1. What type of surgery is being performed? L5-S1 INTERLAMINAR EPIDURAL STEROID INJECTION   2. When is this surgery scheduled? 12/19/19   3. What type of clearance is required (medical clearance vs. Pharmacy clearance to hold med vs. Both)? BOTH  4. Are there any medications that need to be held prior to surgery and how long? ELIQUIS   5. Practice name and name of physician performing surgery? [facility]; DR/ HAO WANG   6. What is the office phone number? [phone]   7.   What is the office fax number? [phone]  8.   Anesthesia type (None, local, MAC, general) ? NONE LISTED   [name] 11/21/2019, 2:09 PM  _________________________________________________________________   (provider comments below)

## 2019-11-21 NOTE — Telephone Encounter (Signed)
Cleared by Dr. Irish Lack for the back injection, however no duration of holding was mentioned in his office note. Will forward to our clinical pharmacist

## 2019-11-22 NOTE — Telephone Encounter (Signed)
   Primary Cardiologist: Larae Grooms, MD  Chart reviewed as part of pre-operative protocol coverage. Given past medical history and time since last visit, based on ACC/AHA guidelines, Sophia Ward would be at acceptable risk for the planned procedure without further cardiovascular testing.   I will route this recommendation to the requesting party via Epic fax function and remove from pre-op pool.  Please call with questions.  Patient was cleared by Dr. Irish Lack for the upcoming procedure. I have informed her to hold Eliquis for 3 days prior to the procedure and restart as soon as possible after the procedure at the surgeon's discretion.  [place], [place] 11/22/2019, 4:09 PM

## 2019-11-22 NOTE — Telephone Encounter (Signed)
Patient with diagnosis of PAF on Eliquis for anticoagulation.    Procedure:  L5-S1 INTERLAMINAR EPIDURAL STEROID INJECTION   Date of procedure: 12/19/19  CHADS2-VASc score of  6 (HTN, DM2, CAD, AGE x 2, female)  CrCl 46 mL/min using adjusted body weight  Per office protocol, patient can hold Eliquis for 3 days prior to procedure.

## 2019-11-28 ENCOUNTER — Other Ambulatory Visit: Payer: Self-pay | Admitting: Interventional Cardiology

## 2019-11-28 MED ORDER — METOPROLOL SUCCINATE ER 100 MG PO TB24: 100.0000 mg | ORAL_TABLET | Freq: Every morning | ORAL | 3 refills | Status: DC

## 2019-11-28 NOTE — Telephone Encounter (Signed)
  *  STAT* If patient is at the pharmacy, call can be transferred to refill team.   1. Which medications need to be refilled? (please list name of each medication and dose if known)   metoprolol succinate (TOPROL-XL) 100 MG 24 hr tablet     2. Which pharmacy/location (including street and city if local pharmacy) is medication to be sent to? [facility] - [place], [place] - [address]  3. Do they need a 30 day or 90 day supply? 90 days

## 2019-11-28 NOTE — Telephone Encounter (Signed)
Left detailed message per DPR letting patient know that metoprolol has been sent in to preferred pharmacy.

## 2019-11-29 DIAGNOSIS — H35352 Cystoid macular degeneration, left eye: Secondary | ICD-10-CM | POA: Diagnosis not present

## 2019-11-29 DIAGNOSIS — H3561 Retinal hemorrhage, right eye: Secondary | ICD-10-CM | POA: Diagnosis not present

## 2019-11-29 DIAGNOSIS — H338 Other retinal detachments: Secondary | ICD-10-CM | POA: Diagnosis not present

## 2019-11-29 DIAGNOSIS — H35373 Puckering of macula, bilateral: Secondary | ICD-10-CM | POA: Diagnosis not present

## 2019-11-29 DIAGNOSIS — H59812 Chorioretinal scars after surgery for detachment, left eye: Secondary | ICD-10-CM | POA: Diagnosis not present

## 2019-11-29 DIAGNOSIS — Z961 Presence of intraocular lens: Secondary | ICD-10-CM | POA: Diagnosis not present

## 2019-12-04 DIAGNOSIS — R5381 Other malaise: Secondary | ICD-10-CM | POA: Diagnosis not present

## 2019-12-04 DIAGNOSIS — Z955 Presence of coronary angioplasty implant and graft: Secondary | ICD-10-CM | POA: Diagnosis not present

## 2019-12-04 DIAGNOSIS — I10 Essential (primary) hypertension: Secondary | ICD-10-CM | POA: Diagnosis not present

## 2019-12-04 DIAGNOSIS — E119 Type 2 diabetes mellitus without complications: Secondary | ICD-10-CM | POA: Diagnosis not present

## 2019-12-04 DIAGNOSIS — R079 Chest pain, unspecified: Secondary | ICD-10-CM | POA: Diagnosis not present

## 2019-12-04 DIAGNOSIS — Z79899 Other long term (current) drug therapy: Secondary | ICD-10-CM | POA: Diagnosis not present

## 2019-12-04 DIAGNOSIS — Z885 Allergy status to narcotic agent status: Secondary | ICD-10-CM | POA: Diagnosis not present

## 2019-12-04 DIAGNOSIS — Z880 Allergy status to penicillin: Secondary | ICD-10-CM | POA: Diagnosis not present

## 2019-12-04 DIAGNOSIS — S299XXA Unspecified injury of thorax, initial encounter: Secondary | ICD-10-CM | POA: Diagnosis not present

## 2019-12-04 DIAGNOSIS — Z794 Long term (current) use of insulin: Secondary | ICD-10-CM | POA: Diagnosis not present

## 2019-12-04 DIAGNOSIS — S2020XA Contusion of thorax, unspecified, initial encounter: Secondary | ICD-10-CM | POA: Diagnosis not present

## 2019-12-10 DIAGNOSIS — D508 Other iron deficiency anemias: Secondary | ICD-10-CM | POA: Diagnosis not present

## 2019-12-10 DIAGNOSIS — R0789 Other chest pain: Secondary | ICD-10-CM | POA: Diagnosis not present

## 2019-12-10 DIAGNOSIS — N183 Chronic kidney disease, stage 3 unspecified: Secondary | ICD-10-CM | POA: Diagnosis not present

## 2019-12-12 ENCOUNTER — Telehealth: Payer: Self-pay | Admitting: *Deleted

## 2019-12-12 NOTE — Telephone Encounter (Signed)
I reviewed patient's labs.  Her WBC count is 15.8, hemoglobin 7.7, MCV 77.8.  CMP showed creatinine of 1.90 and GFR 48.  I discussed with patient that there could be several reasons for anemia.  I am uncertain of the cause of anemia as methotrexate.  She is not on very aggressive therapy for prednisone and declines to be on Biologics.  She has been taking prednisone along with methotrexate.  I have advised her to continue methotrexate for now until she is evaluated by Dr. Irene Limbo.  Patient denies any history of GI bleeding.

## 2019-12-12 NOTE — Telephone Encounter (Signed)
Patient contact the office stating she was trouble with keeping her Hgb up. Patient states her Hgb is currently at 7.7. Patient states at the end of May she was hospitalized for 5 days. Patient states she had several blood transfusions. Patient states she also had multiple test which came back normal. Patient states she was put on iron for 2 weeks and then was taken off of it and her Hgb dropped back down. Patient states Dr. Lysle Rubens advised her it could possibly be the Methotrexate. Patient would like to discontinue the MTX. Patient would like to know if there are other options.

## 2019-12-16 DIAGNOSIS — D509 Iron deficiency anemia, unspecified: Secondary | ICD-10-CM | POA: Diagnosis not present

## 2019-12-16 DIAGNOSIS — R195 Other fecal abnormalities: Secondary | ICD-10-CM | POA: Diagnosis not present

## 2019-12-16 DIAGNOSIS — M353 Polymyalgia rheumatica: Secondary | ICD-10-CM | POA: Diagnosis not present

## 2019-12-16 DIAGNOSIS — Z8679 Personal history of other diseases of the circulatory system: Secondary | ICD-10-CM | POA: Diagnosis not present

## 2019-12-18 ENCOUNTER — Telehealth: Payer: Self-pay | Admitting: Hematology

## 2019-12-18 NOTE — Telephone Encounter (Signed)
Rescheduled 08/11 appointment to 08/09 due to scheduling conflict, called patient and patient is notified.

## 2019-12-19 DIAGNOSIS — M48061 Spinal stenosis, lumbar region without neurogenic claudication: Secondary | ICD-10-CM | POA: Diagnosis not present

## 2019-12-26 ENCOUNTER — Telehealth: Payer: Self-pay | Admitting: *Deleted

## 2019-12-26 NOTE — Telephone Encounter (Signed)
Patient with diagnosis of afib on Eliquis for anticoagulation.    Procedure:  INTERVENTIONAL INJECTIONS FOR PAIN MANAGEMENT  Date of procedure: TBD  CHADS2-VASc score of  6 (HTN, AGE, DM2, CAD, AGE, female)  CrCl 46 ml/min  Per office protocol, patient can hold Eliquis for 3 days prior to procedure.

## 2019-12-26 NOTE — Telephone Encounter (Signed)
   [facility] Pre-operative Risk Assessment    HEARTCARE STAFF: - Please ensure there is not already an duplicate clearance open for this procedure. - Under Visit Info/Reason for Call, type in Other and utilize the format Clearance MM/DD/YY or Clearance TBD. Do not use dashes or single digits. - If request is for dental extraction, please clarify the # of teeth to be extracted.  Request for surgical clearance:  1. What type of surgery is being performed? INTERVENTIONAL INJECTIONS FOR PAIN MANAGEMENT   2. When is this surgery scheduled? TBD   3. What type of clearance is required (medical clearance vs. Pharmacy clearance to hold med vs. Both)?   4. Are there any medications that need to be held prior to surgery and how long? ELIQUIS x 3 DAYS  5. Practice name and name of physician performing surgery? [facility] [address], [address], [place], [place] [address]; DR. Annie Main FRIEDMAN   6. What is the office phone number? [phone]   7.   What is the office fax number? [phone]  8.   Anesthesia type (None, local, MAC, general) ? NOT LISTED   [name] 12/26/2019, 3:18 PM  _________________________________________________________________   (provider comments below)

## 2019-12-30 NOTE — Telephone Encounter (Signed)
   Primary Cardiologist: Larae Grooms, MD  Chart reviewed as part of pre-operative protocol coverage. Given past medical history and time since last visit, based on ACC/AHA guidelines, Sophia Ward would be at acceptable risk for the planned procedure without further cardiovascular testing.   Reviewed by pharmacy team. In setting of Eliquis for atrial fibrillation and CHADS2VASc score of 6 with CrCl 46 ml/min she may hold Eliquis for 3 days prior to procedure.   I will route this recommendation to the requesting party via Epic fax function and remove from pre-op pool.  Please call with questions.  Loel Dubonnet, NP 12/30/2019, 5:01 PM

## 2020-01-01 DIAGNOSIS — D509 Iron deficiency anemia, unspecified: Secondary | ICD-10-CM | POA: Diagnosis not present

## 2020-01-03 ENCOUNTER — Other Ambulatory Visit: Payer: Self-pay | Admitting: *Deleted

## 2020-01-03 DIAGNOSIS — D72829 Elevated white blood cell count, unspecified: Secondary | ICD-10-CM

## 2020-01-06 ENCOUNTER — Telehealth: Payer: Self-pay | Admitting: Hematology

## 2020-01-06 ENCOUNTER — Other Ambulatory Visit: Payer: Self-pay

## 2020-01-06 ENCOUNTER — Inpatient Hospital Stay: Payer: Medicare Other | Attending: Hematology

## 2020-01-06 ENCOUNTER — Inpatient Hospital Stay (HOSPITAL_BASED_OUTPATIENT_CLINIC_OR_DEPARTMENT_OTHER): Payer: Medicare Other | Admitting: Hematology

## 2020-01-06 VITALS — BP 125/44 | HR 71 | Temp 97.8°F | Resp 18 | Ht 62.0 in | Wt 171.7 lb

## 2020-01-06 DIAGNOSIS — M069 Rheumatoid arthritis, unspecified: Secondary | ICD-10-CM | POA: Diagnosis not present

## 2020-01-06 DIAGNOSIS — D500 Iron deficiency anemia secondary to blood loss (chronic): Secondary | ICD-10-CM | POA: Diagnosis not present

## 2020-01-06 DIAGNOSIS — I2510 Atherosclerotic heart disease of native coronary artery without angina pectoris: Secondary | ICD-10-CM | POA: Diagnosis not present

## 2020-01-06 DIAGNOSIS — I25119 Atherosclerotic heart disease of native coronary artery with unspecified angina pectoris: Secondary | ICD-10-CM | POA: Diagnosis not present

## 2020-01-06 DIAGNOSIS — K219 Gastro-esophageal reflux disease without esophagitis: Secondary | ICD-10-CM | POA: Insufficient documentation

## 2020-01-06 DIAGNOSIS — M8580 Other specified disorders of bone density and structure, unspecified site: Secondary | ICD-10-CM | POA: Insufficient documentation

## 2020-01-06 DIAGNOSIS — E538 Deficiency of other specified B group vitamins: Secondary | ICD-10-CM

## 2020-01-06 DIAGNOSIS — I252 Old myocardial infarction: Secondary | ICD-10-CM | POA: Diagnosis not present

## 2020-01-06 DIAGNOSIS — Z79899 Other long term (current) drug therapy: Secondary | ICD-10-CM | POA: Diagnosis not present

## 2020-01-06 DIAGNOSIS — I10 Essential (primary) hypertension: Secondary | ICD-10-CM | POA: Diagnosis not present

## 2020-01-06 DIAGNOSIS — D509 Iron deficiency anemia, unspecified: Secondary | ICD-10-CM | POA: Diagnosis not present

## 2020-01-06 DIAGNOSIS — E785 Hyperlipidemia, unspecified: Secondary | ICD-10-CM | POA: Diagnosis not present

## 2020-01-06 DIAGNOSIS — D72829 Elevated white blood cell count, unspecified: Secondary | ICD-10-CM

## 2020-01-06 DIAGNOSIS — E119 Type 2 diabetes mellitus without complications: Secondary | ICD-10-CM | POA: Diagnosis not present

## 2020-01-06 LAB — CBC WITH DIFFERENTIAL (CANCER CENTER ONLY)
Abs Immature Granulocytes: 0.04 10*3/uL (ref 0.00–0.07)
Basophils Absolute: 0.1 10*3/uL (ref 0.0–0.1)
Basophils Relative: 1 %
Eosinophils Absolute: 0.5 10*3/uL (ref 0.0–0.5)
Eosinophils Relative: 5 %
HCT: 29.7 % — ABNORMAL LOW (ref 36.0–46.0)
Hemoglobin: 8.8 g/dL — ABNORMAL LOW (ref 12.0–15.0)
Immature Granulocytes: 0 %
Lymphocytes Relative: 15 %
Lymphs Abs: 1.5 10*3/uL (ref 0.7–4.0)
MCH: 25.4 pg — ABNORMAL LOW (ref 26.0–34.0)
MCHC: 29.6 g/dL — ABNORMAL LOW (ref 30.0–36.0)
MCV: 85.6 fL (ref 80.0–100.0)
Monocytes Absolute: 1.3 10*3/uL — ABNORMAL HIGH (ref 0.1–1.0)
Monocytes Relative: 13 %
Neutro Abs: 6.8 10*3/uL (ref 1.7–7.7)
Neutrophils Relative %: 66 %
Platelet Count: 311 10*3/uL (ref 150–400)
RBC: 3.47 MIL/uL — ABNORMAL LOW (ref 3.87–5.11)
RDW: 23.5 % — ABNORMAL HIGH (ref 11.5–15.5)
WBC Count: 10.4 10*3/uL (ref 4.0–10.5)
nRBC: 0 % (ref 0.0–0.2)

## 2020-01-06 LAB — LACTATE DEHYDROGENASE: LDH: 237 U/L — ABNORMAL HIGH (ref 98–192)

## 2020-01-06 LAB — IRON AND TIBC
Iron: 16 ug/dL — ABNORMAL LOW (ref 41–142)
Saturation Ratios: 5 % — ABNORMAL LOW (ref 21–57)
TIBC: 325 ug/dL (ref 236–444)
UIBC: 309 ug/dL (ref 120–384)

## 2020-01-06 LAB — CMP (CANCER CENTER ONLY)
ALT: 9 U/L (ref 0–44)
AST: 10 U/L — ABNORMAL LOW (ref 15–41)
Albumin: 3.2 g/dL — ABNORMAL LOW (ref 3.5–5.0)
Alkaline Phosphatase: 49 U/L (ref 38–126)
Anion gap: 9 (ref 5–15)
BUN: 17 mg/dL (ref 8–23)
CO2: 24 mmol/L (ref 22–32)
Calcium: 9.8 mg/dL (ref 8.9–10.3)
Chloride: 106 mmol/L (ref 98–111)
Creatinine: 1.03 mg/dL — ABNORMAL HIGH (ref 0.44–1.00)
GFR, Est AFR Am: 58 mL/min — ABNORMAL LOW (ref >60)
GFR, Estimated: 50 mL/min — ABNORMAL LOW (ref >60)
Glucose, Bld: 156 mg/dL — ABNORMAL HIGH (ref 70–99)
Potassium: 3.8 mmol/L (ref 3.5–5.1)
Sodium: 139 mmol/L (ref 135–145)
Total Bilirubin: 0.4 mg/dL (ref 0.3–1.2)
Total Protein: 6.6 g/dL (ref 6.5–8.1)

## 2020-01-06 LAB — FERRITIN: Ferritin: 31 ng/mL (ref 11–307)

## 2020-01-06 NOTE — Progress Notes (Signed)
Marland Kitchen    HEMATOLOGY/ONCOLOGY CLINIC NOTE  Date of Service: 01/06/2020  Patient Care Team: [name], MD as PCP - General (Internal Medicine) Jettie Booze, MD as PCP - Cardiology (Cardiology) Brunetta Genera, MD as Consulting Physician (Hematology) Bo Merino, MD as Consulting Physician (Rheumatology)   CHIEF COMPLAINTS/PURPOSE OF CONSULTATION:  Elevated WBC counts   HISTORY OF PRESENTING ILLNESS:  Sophia Ward is a wonderful 86 y.o. female who has been referred to [place] by Dr .[name], MD for evaluation and management of leucocytosis.  Patient has a h/o RA on MTX and prednisone taper, CAD,htn who was referral for evaluation of leucocytosis. Patient notes that she has had elevated WBC counts for "years".  Based on available labs patient has had chronic neutrophilia and monocytosis since at least 10/2016 with WBC counts ranging form 14k to 22k. Recent labs on 12/6 showed WBC counts of 22k which has since improved to 14.1l on 06/05/2018. hgb is normal at 13.4 and platelets are normal at 331k.  She notes no fevers/chills/nightsweats/or unexpected weight loss. Has joint pain issues from RA.  No other acute new focal symptoms.   INTERVAL HISTORY:  Sophia Ward is a 86 y.o. female here today for follow up and treatment of her leucocytosis and anemia The patient's last visit with [place] was on 12/17/2018. The pt reports that she is doing well overall.  The pt reports no fevers/night sweats, weight loss. Notes some increase in fatigue. Labs show ferritin has improved from 8 to 31 but is not at goal of >100 hgb still low at 8.8.   MEDICAL HISTORY:  Past Medical History:  Diagnosis Date  . Allergic rhinitis   . Anemia 09/2019  . Arthritis    hands  . BPPV (benign paroxysmal positional vertigo)   . Coronary artery disease   . Coronary atherosclerosis of native coronary artery 01/30/2014  . Diabetes mellitus without complication ([place])   . Essential  hypertension, benign 01/30/2014  . GERD (gastroesophageal reflux disease)   . Hypertension   . Mixed hyperlipidemia 01/30/2014  . Myocardial infarction ([place]) 2004   mild MI-no damage- had stents  . Osteopenia   . Post-menopausal   . Rheumatoid arthritis ([place])   . Spinal stenosis   . Stenosing tenosynovitis     SURGICAL HISTORY: Past Surgical History:  Procedure Laterality Date  . ABDOMINAL HYSTERECTOMY    . APPENDECTOMY    . BIOPSY  10/25/2019   Procedure: BIOPSY;  Surgeon: Otis Brace, MD;  Location: [facility];  Service: Gastroenterology;;  . BIOPSY  10/26/2019   Procedure: BIOPSY;  Surgeon: Otis Brace, MD;  Location: [facility];  Service: Gastroenterology;;  . CARDIAC CATHETERIZATION  2004  . carpel tunnel    . COLONOSCOPY WITH PROPOFOL N/A 10/30/2012   Procedure: COLONOSCOPY WITH PROPOFOL;  Surgeon: Garlan Fair, MD;  Location: [facility];  Service: Endoscopy;  Laterality: N/A;  . COLONOSCOPY WITH PROPOFOL N/A 10/26/2019   Procedure: COLONOSCOPY WITH PROPOFOL;  Surgeon: Otis Brace, MD;  Location: [facility];  Service: Gastroenterology;  Laterality: N/A;  . CORONARY ANGIOPLASTY  2004  . DILATION AND CURETTAGE OF UTERUS    . ESOPHAGOGASTRODUODENOSCOPY N/A 10/25/2019   Procedure: ESOPHAGOGASTRODUODENOSCOPY (EGD);  Surgeon: Otis Brace, MD;  Location: [facility];  Service: Gastroenterology;  Laterality: N/A;  . ESOPHAGOGASTRODUODENOSCOPY (EGD) WITH PROPOFOL N/A 10/30/2012   Procedure: ESOPHAGOGASTRODUODENOSCOPY (EGD) WITH PROPOFOL;  Surgeon: Garlan Fair, MD;  Location: [facility];  Service: Endoscopy;  Laterality: N/A;  . EYE SURGERY  bilateral cataract with lens implant  . EYE SURGERY    . INJECTION OF SILICONE OIL Left 1/44/3154   Procedure: Injection Of Silicone Oil;  Surgeon: Jalene Mullet, MD;  Location: [facility];  Service: Ophthalmology;  Laterality: Left;  . left shouler surgery    . PHOTOCOAGULATION WITH LASER Left 02/12/2019    Procedure: Photocoagulation With Laser;  Surgeon: Jalene Mullet, MD;  Location: [facility];  Service: Ophthalmology;  Laterality: Left;  . 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 02/12/2019   Procedure: REPAIR OF COMPLEX TRACTION RETINAL DETACHMENT;  Surgeon: Jalene Mullet, MD;  Location: [facility];  Service: Ophthalmology;  Laterality: Left;  . TONSILLECTOMY    . VITRECTOMY 25 GAUGE WITH SCLERAL BUCKLE Left 02/12/2019   Procedure: Vitrectomy 25 Gauge With Scleral Buckle;  Surgeon: Jalene Mullet, MD;  Location: [facility];  Service: Ophthalmology;  Laterality: Left;    SOCIAL HISTORY: Social History   Socioeconomic History  . Marital status: Married    Spouse name: Not on file  . Number of children: Not on file  . Years of education: Not on file  . Highest education level: Not on file  Occupational History  . Not on file  Tobacco Use  . Smoking status: Never Smoker  . Smokeless tobacco: Never Used  Vaping Use  . Vaping Use: Never used  Substance and Sexual Activity  . Alcohol use: No  . Drug use: No  . Sexual activity: Not on file  Other Topics Concern  . Not on file  Social History Narrative  . Not on file   Social Determinants of Health   Financial Resource Strain:   . Difficulty of Paying Living Expenses:   Food Insecurity:   . Worried About Charity fundraiser in the Last Year:   . Arboriculturist in the Last Year:   Transportation Needs:   . Film/video editor (Medical):   Marland Kitchen Lack of Transportation (Non-Medical):   Physical Activity:   . Days of Exercise per Week:   . Minutes of Exercise per Session:   Stress:   . Feeling of Stress :   Social Connections:   . Frequency of Communication with Friends and Family:   . Frequency of Social Gatherings with Friends and Family:   . Attends Religious Services:   . Active Member of Clubs or Organizations:   . Attends Archivist Meetings:   Marland Kitchen Marital Status:   Intimate Partner Violence:   . Fear of  Current or Ex-Partner:   . Emotionally Abused:   Marland Kitchen Physically Abused:   . Sexually Abused:     FAMILY HISTORY: Family History  Problem Relation Age of Onset  . CAD Mother   . Heart attack Mother   . CAD Father   . Heart Problems Brother        open heart surgery   . Arthritis Daughter        psoriatic arthritis     ALLERGIES:  is allergic to codeine, glimepiride, jardiance [empagliflozin], metformin and related, and penicillins.   MEDICATIONS:  Current Outpatient Medications  Medication Sig Dispense Refill  . acetaminophen (TYLENOL) 650 MG CR tablet Take 1,300 mg by mouth at bedtime.    Marland Kitchen amLODipine (NORVASC) 10 MG tablet Take 1 tablet (10 mg total) by mouth every morning. 90 tablet 3  . apixaban (ELIQUIS) 5 MG TABS tablet Take 1 tablet (5 mg total) by mouth 2 (two) times daily. 180 tablet 1  . atorvastatin (LIPITOR)  40 MG tablet Take 1 tablet (40 mg total) by mouth at bedtime. 90 tablet 3  . B-D ULTRAFINE III SHORT PEN 31G X 8 MM MISC 1 each by Other route in the morning, at noon, in the evening, and at bedtime.     . benzonatate (TESSALON) 100 MG capsule Take 1 capsule (100 mg total) by mouth 3 (three) times daily. 20 capsule 0  . Calcium Carbonate-Vitamin D (CALCIUM + D PO) Take 1 tablet by mouth every morning.    . Cholecalciferol (VITAMIN D3) 125 MCG (5000 UT) CAPS Take 5,000 Units by mouth daily.     Marland Kitchen estradiol (ESTRACE) 0.1 MG/GM vaginal cream Place 1 Applicatorful vaginally daily.     . folic acid (FOLVITE) 1 MG tablet Take 2 tablets (2 mg total) by mouth daily. 180 tablet 2  . insulin lispro (HUMALOG KWIKPEN) 100 UNIT/ML KiwkPen Inject 6 Units into the skin 3 (three) times daily.     Marland Kitchen LEVEMIR FLEXTOUCH 100 UNIT/ML Pen Inject 14 Units into the skin every morning.   0  . methotrexate 2.5 MG tablet TAKE 5 TABLETS BY MOUTH ONCE A WEEK. Caution:Chemotherapy. Protect from light. 60 tablet 0  . metoprolol succinate (TOPROL-XL) 100 MG 24 hr tablet Take 1 tablet (100 mg total)  by mouth every morning. 90 tablet 3  . nitroGLYCERIN (NITROSTAT) 0.4 MG SL tablet DISSOLVE ONE TABLET UNDER THE TONGUE EVERY 5 MINUTES AS NEEDED FOR CHEST PAIN.  DO NOT EXCEED A TOTAL OF 3 DOSES IN 15 MINUTES 25 tablet 5  . Omega-3 Fatty Acids (FISH OIL PO) Take 1 capsule by mouth every morning.    . pantoprazole (PROTONIX) 40 MG tablet Take 1 tablet (40 mg total) by mouth daily after lunch. 30 tablet 1  . predniSONE (DELTASONE) 1 MG tablet Take 4 tablets (4 mg total) by mouth daily with breakfast. 360 tablet 0  . ramipril (ALTACE) 10 MG tablet Take 10 mg by mouth 2 (two) times daily.      No current facility-administered medications for this visit.    REVIEW OF SYSTEMS:   A 10+ POINT REVIEW OF SYSTEMS WAS OBTAINED including neurology, dermatology, psychiatry, cardiac, respiratory, lymph, extremities, GI, GU, Musculoskeletal, constitutional, breasts, reproductive, HEENT.  All pertinent positives are noted in the HPI.  All others are negative.   PHYSICAL EXAMINATION: ECOG PERFORMANCE STATUS: 1 - Symptomatic but completely ambulatory  Vitals:   01/06/20 0955  BP: (!) 125/44  Pulse: 71  Resp: 18  Temp: 97.8 F (36.6 C)  SpO2: 99%   Filed Weights   01/06/20 0955  Weight: 171 lb 11.2 oz (77.9 kg)   Body mass index is 31.4 kg/m.  GENERAL:alert, in no acute distress and comfortable SKIN: no acute rashes, no significant lesions EYES: conjunctiva are pink and non-injected, sclera anicteric OROPHARYNX: MMM, no exudates, no oropharyngeal erythema or ulceration NECK: supple, no JVD LYMPH:  no palpable lymphadenopathy in the cervical, axillary or inguinal regions LUNGS: clear to auscultation b/l with normal respiratory effort HEART: regular rate & rhythm ABDOMEN:  normoactive bowel sounds , non tender, not distended. No palpable hepatosplenomegaly.  Extremity: no pedal edema PSYCH: alert & oriented x 3 with fluent speech NEURO: no focal motor/sensory deficits   LABORATORY DATA:  I  have reviewed the data as listed  CBC Latest Ref Rng & Units 10/27/2019 10/26/2019 10/25/2019  WBC 4.0 - 10.5 K/uL 15.0(H) 15.3(H) 15.8(H)  Hemoglobin 12.0 - 15.0 g/dL 9.5(L) 8.9(L) 7.4(L)  Hematocrit 36 - 46 % 31.7(L)  29.3(L) 25.5(L)  Platelets 150 - 400 K/uL 425(H) 396 404(H)   CBC    Component Value Date/Time   WBC 18.1 (H) 01/13/2020 0523   RBC 3.64 (L) 01/13/2020 0523   HGB 9.1 (L) 01/13/2020 0523   HGB 8.8 (L) 01/06/2020 0904   HGB 7.2 (L) 10/22/2019 2158   HCT 31.8 (L) 01/13/2020 0523   PLT 435 (H) 01/13/2020 0523   PLT 311 01/06/2020 0904   MCV 87.4 01/13/2020 0523   MCH 25.0 (L) 01/13/2020 0523   MCHC 28.6 (L) 01/13/2020 0523   RDW 22.5 (H) 01/13/2020 0523   LYMPHSABS 0.6 (L) 01/13/2020 0523   MONOABS 0.7 01/13/2020 0523   EOSABS 0.2 01/13/2020 0523   BASOSABS 0.1 01/13/2020 0523    . CMP Latest Ref Rng & Units 10/27/2019 10/26/2019 10/25/2019  Glucose 70 - 99 mg/dL 142(H) 97 103(H)  BUN 8 - 23 mg/dL 12 9 11   Creatinine 0.44 - 1.00 mg/dL 0.87 0.91 1.02(H)  Sodium 135 - 145 mmol/L 135 138 138  Potassium 3.5 - 5.1 mmol/L 3.0(L) 4.0 4.3  Chloride 98 - 111 mmol/L 104 106 106  CO2 22 - 32 mmol/L 21(L) 23 23  Calcium 8.9 - 10.3 mg/dL 8.5(L) 8.8(L) 8.8(L)  Total Protein 6.5 - 8.1 g/dL - - -  Total Bilirubin 0.3 - 1.2 mg/dL - - -  Alkaline Phos 38 - 126 U/L - - -  AST 15 - 41 U/L - - -  ALT 0 - 44 U/L - - -   Lab Results  Component Value Date   LDH 197 (H) 10/22/2019   Sed rate 14  . Lab Results  Component Value Date   IRON 16 (L) 01/06/2020   TIBC 325 01/06/2020   IRONPCTSAT 5 (L) 01/06/2020   (Iron and TIBC)  Lab Results  Component Value Date   FERRITIN 31 01/06/2020     06/05/2018 [facility] FISH BCR/ABL    06/05/2018 [facility] NGS JAK2 MPL and CALR    RADIOGRAPHIC STUDIES: I have personally reviewed the radiological images as listed and agreed with the findings in the report. No results found.   ASSESSMENT & PLAN:   86 yo with    1)  Chronic leucocytosis from atleast 10/2016 in the 14-22k range. Primarily neutrophilia and monocytosis. Cannot r/o CMML. R/o CML neg for BCR ABL Reactive process due to inflammation from RA Does on report any recent fevers or fevers. -Clonal markers neg for BCR-ABL, Jak2,MPL and CALR   2) Microcytic Anemia due to IDA and anemia of chronic disease  PLAN:  -Discussed increase in WBC likely due to rheumatoid arthritis. And steroid injections -Discussed follow up with rheumatologist and PCP. -plavix to eliquis 5mg  - consider dose reduction on Eliquis to 2.5mg  po BID to reduce risk of bleeding given patients age -patient has been on ferrous sulfate 1 bid x 6 weeks - add B complex -MTX intestinal ulcers -12.5 mg weekly + Prednisone 4mg  daily -PPI Iron def -cap endoscopy - GI f.u tommorow -B complex 1 cap FOLLOW UP: IV INjectafer weekly x 2 doses  starting in about 1 week RTC with Dr Irene Limbo with labs in 3 months  The total time spent in the appt was 20 minutes and more than 50% was on counseling and direct patient cares.  All of the patient's questions were answered with apparent satisfaction. The patient knows to call the clinic with any problems, questions or concerns.  Sullivan Lone MD MS AAHIVMS [facility] Hematology/Oncology Physician [facility]  [place]  (Office):       [phone] (Work cell):  [phone] (Fax):           [phone]  01/06/2020 7:17 AM  I, Yevette Edwards, am acting as a scribe for Dr. Sullivan Lone.   .I have reviewed the above documentation for accuracy and completeness, and I agree with the above. Brunetta Genera MD

## 2020-01-06 NOTE — Telephone Encounter (Signed)
Scheduled per 08/09 los, patient received updated calender.

## 2020-01-08 ENCOUNTER — Ambulatory Visit: Payer: Medicare Other | Admitting: Hematology

## 2020-01-08 ENCOUNTER — Other Ambulatory Visit: Payer: Medicare Other

## 2020-01-13 ENCOUNTER — Emergency Department (HOSPITAL_COMMUNITY): Payer: Medicare Other

## 2020-01-13 ENCOUNTER — Other Ambulatory Visit: Payer: Self-pay

## 2020-01-13 ENCOUNTER — Inpatient Hospital Stay: Payer: Medicare Other

## 2020-01-13 ENCOUNTER — Inpatient Hospital Stay (HOSPITAL_COMMUNITY)
Admission: EM | Admit: 2020-01-13 | Discharge: 2020-01-20 | DRG: 871 | Disposition: A | Discharge status: Home / Self Care | Payer: Medicare Other | Attending: Internal Medicine | Admitting: Internal Medicine

## 2020-01-13 ENCOUNTER — Telehealth: Payer: Self-pay | Admitting: Hematology

## 2020-01-13 ENCOUNTER — Encounter (HOSPITAL_COMMUNITY): Payer: Self-pay | Admitting: Emergency Medicine

## 2020-01-13 DIAGNOSIS — M353 Polymyalgia rheumatica: Secondary | ICD-10-CM | POA: Diagnosis not present

## 2020-01-13 DIAGNOSIS — E872 Acidosis: Secondary | ICD-10-CM | POA: Diagnosis present

## 2020-01-13 DIAGNOSIS — Z6831 Body mass index (BMI) 31.0-31.9, adult: Secondary | ICD-10-CM | POA: Diagnosis not present

## 2020-01-13 DIAGNOSIS — I080 Rheumatic disorders of both mitral and aortic valves: Secondary | ICD-10-CM | POA: Diagnosis present

## 2020-01-13 DIAGNOSIS — E109 Type 1 diabetes mellitus without complications: Secondary | ICD-10-CM | POA: Diagnosis present

## 2020-01-13 DIAGNOSIS — E669 Obesity, unspecified: Secondary | ICD-10-CM | POA: Diagnosis present

## 2020-01-13 DIAGNOSIS — R0602 Shortness of breath: Secondary | ICD-10-CM | POA: Diagnosis not present

## 2020-01-13 DIAGNOSIS — R0600 Dyspnea, unspecified: Secondary | ICD-10-CM | POA: Diagnosis not present

## 2020-01-13 DIAGNOSIS — R6520 Severe sepsis without septic shock: Secondary | ICD-10-CM | POA: Diagnosis not present

## 2020-01-13 DIAGNOSIS — E782 Mixed hyperlipidemia: Secondary | ICD-10-CM | POA: Diagnosis present

## 2020-01-13 DIAGNOSIS — L8990 Pressure ulcer of unspecified site, unspecified stage: Secondary | ICD-10-CM | POA: Insufficient documentation

## 2020-01-13 DIAGNOSIS — I350 Nonrheumatic aortic (valve) stenosis: Secondary | ICD-10-CM | POA: Diagnosis not present

## 2020-01-13 DIAGNOSIS — D509 Iron deficiency anemia, unspecified: Secondary | ICD-10-CM | POA: Diagnosis present

## 2020-01-13 DIAGNOSIS — Z8249 Family history of ischemic heart disease and other diseases of the circulatory system: Secondary | ICD-10-CM

## 2020-01-13 DIAGNOSIS — I2510 Atherosclerotic heart disease of native coronary artery without angina pectoris: Secondary | ICD-10-CM | POA: Diagnosis present

## 2020-01-13 DIAGNOSIS — I110 Hypertensive heart disease with heart failure: Secondary | ICD-10-CM | POA: Diagnosis present

## 2020-01-13 DIAGNOSIS — R0902 Hypoxemia: Secondary | ICD-10-CM

## 2020-01-13 DIAGNOSIS — A419 Sepsis, unspecified organism: Secondary | ICD-10-CM | POA: Diagnosis not present

## 2020-01-13 DIAGNOSIS — M4800 Spinal stenosis, site unspecified: Secondary | ICD-10-CM | POA: Diagnosis present

## 2020-01-13 DIAGNOSIS — I5033 Acute on chronic diastolic (congestive) heart failure: Secondary | ICD-10-CM | POA: Diagnosis not present

## 2020-01-13 DIAGNOSIS — I480 Paroxysmal atrial fibrillation: Secondary | ICD-10-CM | POA: Diagnosis not present

## 2020-01-13 DIAGNOSIS — Z7952 Long term (current) use of systemic steroids: Secondary | ICD-10-CM

## 2020-01-13 DIAGNOSIS — J9690 Respiratory failure, unspecified, unspecified whether with hypoxia or hypercapnia: Secondary | ICD-10-CM | POA: Diagnosis not present

## 2020-01-13 DIAGNOSIS — D84821 Immunodeficiency due to drugs: Secondary | ICD-10-CM | POA: Diagnosis not present

## 2020-01-13 DIAGNOSIS — M0579 Rheumatoid arthritis with rheumatoid factor of multiple sites without organ or systems involvement: Secondary | ICD-10-CM | POA: Diagnosis not present

## 2020-01-13 DIAGNOSIS — B9620 Unspecified Escherichia coli [E. coli] as the cause of diseases classified elsewhere: Secondary | ICD-10-CM | POA: Diagnosis not present

## 2020-01-13 DIAGNOSIS — Z955 Presence of coronary angioplasty implant and graft: Secondary | ICD-10-CM

## 2020-01-13 DIAGNOSIS — R0689 Other abnormalities of breathing: Secondary | ICD-10-CM | POA: Diagnosis not present

## 2020-01-13 DIAGNOSIS — I252 Old myocardial infarction: Secondary | ICD-10-CM | POA: Diagnosis not present

## 2020-01-13 DIAGNOSIS — L89301 Pressure ulcer of unspecified buttock, stage 1: Secondary | ICD-10-CM | POA: Diagnosis present

## 2020-01-13 DIAGNOSIS — Z794 Long term (current) use of insulin: Secondary | ICD-10-CM

## 2020-01-13 DIAGNOSIS — Z20822 Contact with and (suspected) exposure to covid-19: Secondary | ICD-10-CM | POA: Diagnosis present

## 2020-01-13 DIAGNOSIS — I4891 Unspecified atrial fibrillation: Secondary | ICD-10-CM | POA: Diagnosis not present

## 2020-01-13 DIAGNOSIS — K529 Noninfective gastroenteritis and colitis, unspecified: Secondary | ICD-10-CM | POA: Diagnosis present

## 2020-01-13 DIAGNOSIS — E1165 Type 2 diabetes mellitus with hyperglycemia: Secondary | ICD-10-CM | POA: Diagnosis not present

## 2020-01-13 DIAGNOSIS — N390 Urinary tract infection, site not specified: Secondary | ICD-10-CM | POA: Diagnosis not present

## 2020-01-13 DIAGNOSIS — R509 Fever, unspecified: Secondary | ICD-10-CM | POA: Diagnosis not present

## 2020-01-13 DIAGNOSIS — I10 Essential (primary) hypertension: Secondary | ICD-10-CM | POA: Diagnosis not present

## 2020-01-13 DIAGNOSIS — Z79899 Other long term (current) drug therapy: Secondary | ICD-10-CM

## 2020-01-13 DIAGNOSIS — M8580 Other specified disorders of bone density and structure, unspecified site: Secondary | ICD-10-CM | POA: Diagnosis present

## 2020-01-13 DIAGNOSIS — E876 Hypokalemia: Secondary | ICD-10-CM | POA: Diagnosis present

## 2020-01-13 DIAGNOSIS — R739 Hyperglycemia, unspecified: Secondary | ICD-10-CM

## 2020-01-13 DIAGNOSIS — J189 Pneumonia, unspecified organism: Secondary | ICD-10-CM | POA: Diagnosis not present

## 2020-01-13 DIAGNOSIS — K219 Gastro-esophageal reflux disease without esophagitis: Secondary | ICD-10-CM | POA: Diagnosis present

## 2020-01-13 DIAGNOSIS — J90 Pleural effusion, not elsewhere classified: Secondary | ICD-10-CM | POA: Diagnosis not present

## 2020-01-13 DIAGNOSIS — I959 Hypotension, unspecified: Secondary | ICD-10-CM | POA: Diagnosis present

## 2020-01-13 DIAGNOSIS — R700 Elevated erythrocyte sedimentation rate: Secondary | ICD-10-CM | POA: Diagnosis not present

## 2020-01-13 DIAGNOSIS — Z87440 Personal history of urinary (tract) infections: Secondary | ICD-10-CM

## 2020-01-13 DIAGNOSIS — F329 Major depressive disorder, single episode, unspecified: Secondary | ICD-10-CM | POA: Diagnosis present

## 2020-01-13 DIAGNOSIS — I5031 Acute diastolic (congestive) heart failure: Secondary | ICD-10-CM | POA: Diagnosis not present

## 2020-01-13 DIAGNOSIS — Z8261 Family history of arthritis: Secondary | ICD-10-CM

## 2020-01-13 DIAGNOSIS — I517 Cardiomegaly: Secondary | ICD-10-CM | POA: Diagnosis not present

## 2020-01-13 DIAGNOSIS — J80 Acute respiratory distress syndrome: Secondary | ICD-10-CM | POA: Diagnosis not present

## 2020-01-13 DIAGNOSIS — E6609 Other obesity due to excess calories: Secondary | ICD-10-CM

## 2020-01-13 DIAGNOSIS — R000 Tachycardia, unspecified: Secondary | ICD-10-CM | POA: Diagnosis not present

## 2020-01-13 DIAGNOSIS — Z7901 Long term (current) use of anticoagulants: Secondary | ICD-10-CM

## 2020-01-13 DIAGNOSIS — E860 Dehydration: Secondary | ICD-10-CM | POA: Diagnosis not present

## 2020-01-13 DIAGNOSIS — I509 Heart failure, unspecified: Secondary | ICD-10-CM | POA: Diagnosis not present

## 2020-01-13 DIAGNOSIS — J9601 Acute respiratory failure with hypoxia: Secondary | ICD-10-CM | POA: Diagnosis present

## 2020-01-13 DIAGNOSIS — N179 Acute kidney failure, unspecified: Secondary | ICD-10-CM | POA: Diagnosis not present

## 2020-01-13 LAB — HEMOGLOBIN A1C
Hgb A1c MFr Bld: 5.7 % — ABNORMAL HIGH (ref 4.8–5.6)
Mean Plasma Glucose: 116.89 mg/dL

## 2020-01-13 LAB — APTT: aPTT: 33 seconds (ref 24–36)

## 2020-01-13 LAB — STREP PNEUMONIAE URINARY ANTIGEN: Strep Pneumo Urinary Antigen: NEGATIVE

## 2020-01-13 LAB — COMPREHENSIVE METABOLIC PANEL
ALT: 15 U/L (ref 0–44)
AST: 27 U/L (ref 15–41)
Albumin: 3.1 g/dL — ABNORMAL LOW (ref 3.5–5.0)
Alkaline Phosphatase: 66 U/L (ref 38–126)
Anion gap: 18 — ABNORMAL HIGH (ref 5–15)
BUN: 18 mg/dL (ref 8–23)
CO2: 16 mmol/L — ABNORMAL LOW (ref 22–32)
Calcium: 8.8 mg/dL — ABNORMAL LOW (ref 8.9–10.3)
Chloride: 105 mmol/L (ref 98–111)
Creatinine, Ser: 1.36 mg/dL — ABNORMAL HIGH (ref 0.44–1.00)
GFR calc Af Amer: 41 mL/min — ABNORMAL LOW (ref >60)
GFR calc non Af Amer: 36 mL/min — ABNORMAL LOW (ref >60)
Glucose, Bld: 226 mg/dL — ABNORMAL HIGH (ref 70–99)
Potassium: 3.2 mmol/L — ABNORMAL LOW (ref 3.5–5.1)
Sodium: 139 mmol/L (ref 135–145)
Total Bilirubin: 0.9 mg/dL (ref 0.3–1.2)
Total Protein: 6.6 g/dL (ref 6.5–8.1)

## 2020-01-13 LAB — CBC WITH DIFFERENTIAL/PLATELET
Abs Immature Granulocytes: 0.11 10*3/uL — ABNORMAL HIGH (ref 0.00–0.07)
Basophils Absolute: 0.1 10*3/uL (ref 0.0–0.1)
Basophils Relative: 1 %
Eosinophils Absolute: 0.2 10*3/uL (ref 0.0–0.5)
Eosinophils Relative: 1 %
HCT: 31.8 % — ABNORMAL LOW (ref 36.0–46.0)
Hemoglobin: 9.1 g/dL — ABNORMAL LOW (ref 12.0–15.0)
Immature Granulocytes: 1 %
Lymphocytes Relative: 3 %
Lymphs Abs: 0.6 10*3/uL — ABNORMAL LOW (ref 0.7–4.0)
MCH: 25 pg — ABNORMAL LOW (ref 26.0–34.0)
MCHC: 28.6 g/dL — ABNORMAL LOW (ref 30.0–36.0)
MCV: 87.4 fL (ref 80.0–100.0)
Monocytes Absolute: 0.7 10*3/uL (ref 0.1–1.0)
Monocytes Relative: 4 %
Neutro Abs: 16.5 10*3/uL — ABNORMAL HIGH (ref 1.7–7.7)
Neutrophils Relative %: 90 %
Platelets: 435 10*3/uL — ABNORMAL HIGH (ref 150–400)
RBC: 3.64 MIL/uL — ABNORMAL LOW (ref 3.87–5.11)
RDW: 22.5 % — ABNORMAL HIGH (ref 11.5–15.5)
WBC: 18.1 10*3/uL — ABNORMAL HIGH (ref 4.0–10.5)
nRBC: 0.2 % (ref 0.0–0.2)

## 2020-01-13 LAB — CBG MONITORING, ED
Glucose-Capillary: 151 mg/dL — ABNORMAL HIGH (ref 70–99)
Glucose-Capillary: 151 mg/dL — ABNORMAL HIGH (ref 70–99)
Glucose-Capillary: 180 mg/dL — ABNORMAL HIGH (ref 70–99)
Glucose-Capillary: 189 mg/dL — ABNORMAL HIGH (ref 70–99)
Glucose-Capillary: 243 mg/dL — ABNORMAL HIGH (ref 70–99)

## 2020-01-13 LAB — URINALYSIS, ROUTINE W REFLEX MICROSCOPIC
Bilirubin Urine: NEGATIVE
Glucose, UA: NEGATIVE mg/dL
Ketones, ur: NEGATIVE mg/dL
Nitrite: POSITIVE — AB
Protein, ur: 100 mg/dL — AB
Specific Gravity, Urine: 1.02 (ref 1.005–1.030)
pH: 6 (ref 5.0–8.0)

## 2020-01-13 LAB — HIV ANTIBODY (ROUTINE TESTING W REFLEX): HIV Screen 4th Generation wRfx: NONREACTIVE

## 2020-01-13 LAB — URINALYSIS, MICROSCOPIC (REFLEX)
Squamous Epithelial / HPF: NONE SEEN (ref 0–5)
WBC, UA: >50 WBC/hpf (ref 0–5)

## 2020-01-13 LAB — PROTIME-INR
INR: 1.5 — ABNORMAL HIGH (ref 0.8–1.2)
Prothrombin Time: 17.5 seconds — ABNORMAL HIGH (ref 11.4–15.2)

## 2020-01-13 LAB — PROCALCITONIN: Procalcitonin: 3.01 ng/mL

## 2020-01-13 LAB — LACTIC ACID, PLASMA
Lactic Acid, Venous: 3.6 mmol/L (ref 0.5–1.9)
Lactic Acid, Venous: 3.5 mmol/L (ref 0.5–1.9)
Lactic Acid, Venous: 3.6 mmol/L (ref 0.5–1.9)
Lactic Acid, Venous: 1.4 mmol/L (ref 0.5–1.9)

## 2020-01-13 LAB — BRAIN NATRIURETIC PEPTIDE: B Natriuretic Peptide: 277.1 pg/mL — ABNORMAL HIGH (ref 0.0–100.0)

## 2020-01-13 LAB — SARS CORONAVIRUS 2 BY RT PCR (HOSPITAL ORDER, PERFORMED IN ~~LOC~~ HOSPITAL LAB): SARS Coronavirus 2: NEGATIVE

## 2020-01-13 MED ORDER — PANTOPRAZOLE SODIUM 40 MG PO TBEC
40.0000 mg | DELAYED_RELEASE_TABLET | Freq: Every day | ORAL | Status: DC
  Administered 2020-01-13 – 2020-01-20 (×7): 40 mg via ORAL
  Filled 2020-01-13 (×7): qty 1

## 2020-01-13 MED ORDER — ACETAMINOPHEN 650 MG RE SUPP: 650.0000 mg | Freq: Four times a day (QID) | RECTAL | Status: DC | PRN

## 2020-01-13 MED ORDER — LEVOFLOXACIN IN D5W 750 MG/150ML IV SOLN: 750.0000 mg | Freq: Once | INTRAVENOUS | Status: DC

## 2020-01-13 MED ORDER — INSULIN DETEMIR 100 UNIT/ML ~~LOC~~ SOLN
14.0000 [IU] | Freq: Every morning | SUBCUTANEOUS | Status: DC
  Administered 2020-01-13 – 2020-01-20 (×8): 14 [IU] via SUBCUTANEOUS
  Filled 2020-01-13 (×8): qty 0.14

## 2020-01-13 MED ORDER — INSULIN ASPART 100 UNIT/ML ~~LOC~~ SOLN
6.0000 [IU] | Freq: Three times a day (TID) | SUBCUTANEOUS | Status: DC
  Administered 2020-01-13 – 2020-01-20 (×20): 6 [IU] via SUBCUTANEOUS

## 2020-01-13 MED ORDER — PREDNISONE 1 MG PO TABS
4.0000 mg | ORAL_TABLET | Freq: Every day | ORAL | Status: DC
  Administered 2020-01-13 – 2020-01-20 (×8): 4 mg via ORAL
  Filled 2020-01-13 (×10): qty 4

## 2020-01-13 MED ORDER — METHYLPREDNISOLONE SODIUM SUCC 125 MG IJ SOLR
125.0000 mg | INTRAMUSCULAR | Status: AC
  Administered 2020-01-13: 125 mg via INTRAVENOUS
  Filled 2020-01-13: qty 2

## 2020-01-13 MED ORDER — APIXABAN 5 MG PO TABS
5.0000 mg | ORAL_TABLET | Freq: Two times a day (BID) | ORAL | Status: DC
  Administered 2020-01-13 – 2020-01-20 (×15): 5 mg via ORAL
  Filled 2020-01-13 (×15): qty 1

## 2020-01-13 MED ORDER — IPRATROPIUM-ALBUTEROL 0.5-2.5 (3) MG/3ML IN SOLN: 3.0000 mL | RESPIRATORY_TRACT | Status: DC | PRN

## 2020-01-13 MED ORDER — ESCITALOPRAM OXALATE 10 MG PO TABS
5.0000 mg | ORAL_TABLET | Freq: Every day | ORAL | Status: DC
  Administered 2020-01-13 – 2020-01-20 (×8): 5 mg via ORAL
  Filled 2020-01-13 (×8): qty 1

## 2020-01-13 MED ORDER — SODIUM CHLORIDE 0.9% FLUSH
3.0000 mL | Freq: Two times a day (BID) | INTRAVENOUS | Status: DC
  Administered 2020-01-13 – 2020-01-19 (×10): 3 mL via INTRAVENOUS

## 2020-01-13 MED ORDER — LACTATED RINGERS IV SOLN: INTRAVENOUS | Status: DC

## 2020-01-13 MED ORDER — ACETAMINOPHEN 325 MG PO TABS
650.0000 mg | ORAL_TABLET | Freq: Every day | ORAL | Status: DC
  Administered 2020-01-13 – 2020-01-19 (×7): 650 mg via ORAL
  Filled 2020-01-13 (×7): qty 2

## 2020-01-13 MED ORDER — SODIUM CHLORIDE 0.9 % IV SOLN
1.0000 g | INTRAVENOUS | Status: AC
  Administered 2020-01-13 – 2020-01-19 (×7): 1 g via INTRAVENOUS
  Filled 2020-01-13 (×8): qty 10

## 2020-01-13 MED ORDER — IPRATROPIUM-ALBUTEROL 0.5-2.5 (3) MG/3ML IN SOLN
3.0000 mL | RESPIRATORY_TRACT | Status: AC
  Administered 2020-01-13: 3 mL via RESPIRATORY_TRACT
  Filled 2020-01-13: qty 3

## 2020-01-13 MED ORDER — SODIUM CHLORIDE 0.9 % IV SOLN
510.0000 mg | Freq: Once | INTRAVENOUS | Status: AC
  Administered 2020-01-14: 510 mg via INTRAVENOUS
  Filled 2020-01-13: qty 17

## 2020-01-13 MED ORDER — LACTATED RINGERS IV BOLUS (SEPSIS)
1000.0000 mL | Freq: Once | INTRAVENOUS | Status: AC
  Administered 2020-01-13: 1000 mL via INTRAVENOUS

## 2020-01-13 MED ORDER — METHOTREXATE 2.5 MG PO TABS
12.5000 mg | ORAL_TABLET | ORAL | Status: DC
  Administered 2020-01-15: 12.5 mg via ORAL
  Filled 2020-01-13: qty 5

## 2020-01-13 MED ORDER — GUAIFENESIN ER 600 MG PO TB12
600.0000 mg | ORAL_TABLET | Freq: Two times a day (BID) | ORAL | Status: DC
  Administered 2020-01-13 – 2020-01-20 (×15): 600 mg via ORAL
  Filled 2020-01-13 (×15): qty 1

## 2020-01-13 MED ORDER — LACTATED RINGERS IV BOLUS (SEPSIS)
500.0000 mL | Freq: Once | INTRAVENOUS | Status: AC
  Administered 2020-01-13: 500 mL via INTRAVENOUS

## 2020-01-13 MED ORDER — ONDANSETRON HCL 4 MG PO TABS: 4.0000 mg | ORAL_TABLET | Freq: Four times a day (QID) | ORAL | Status: DC | PRN

## 2020-01-13 MED ORDER — INSULIN ASPART 100 UNIT/ML ~~LOC~~ SOLN: 0.0000 [IU] | Freq: Three times a day (TID) | SUBCUTANEOUS | Status: DC

## 2020-01-13 MED ORDER — ESTRADIOL 0.1 MG/GM VA CREA
1.0000 | TOPICAL_CREAM | Freq: Every day | VAGINAL | Status: DC | PRN
  Filled 2020-01-13: qty 42.5

## 2020-01-13 MED ORDER — SODIUM CHLORIDE 0.9 % IV SOLN
500.0000 mg | INTRAVENOUS | Status: DC
  Administered 2020-01-13 – 2020-01-17 (×5): 500 mg via INTRAVENOUS
  Filled 2020-01-13 (×6): qty 500

## 2020-01-13 MED ORDER — FUROSEMIDE 10 MG/ML IJ SOLN
20.0000 mg | Freq: Once | INTRAMUSCULAR | Status: AC
  Administered 2020-01-13: 20 mg via INTRAVENOUS
  Filled 2020-01-13: qty 2

## 2020-01-13 MED ORDER — HYDROCORTISONE NA SUCCINATE PF 100 MG IJ SOLR: 100.0000 mg | Freq: Once | INTRAMUSCULAR | Status: DC

## 2020-01-13 MED ORDER — ONDANSETRON HCL 4 MG/2ML IJ SOLN: 4.0000 mg | Freq: Four times a day (QID) | INTRAMUSCULAR | Status: DC | PRN

## 2020-01-13 MED ORDER — ATORVASTATIN CALCIUM 40 MG PO TABS
40.0000 mg | ORAL_TABLET | Freq: Every day | ORAL | Status: DC
  Administered 2020-01-13 – 2020-01-19 (×7): 40 mg via ORAL
  Filled 2020-01-13 (×7): qty 1

## 2020-01-13 MED ORDER — POTASSIUM CHLORIDE CRYS ER 20 MEQ PO TBCR
40.0000 meq | EXTENDED_RELEASE_TABLET | ORAL | Status: AC
  Administered 2020-01-13: 40 meq via ORAL
  Filled 2020-01-13: qty 2

## 2020-01-13 MED ORDER — ACETAMINOPHEN 325 MG PO TABS: 650.0000 mg | ORAL_TABLET | Freq: Four times a day (QID) | ORAL | Status: DC | PRN

## 2020-01-13 MED ORDER — LORAZEPAM 2 MG/ML IJ SOLN
0.2500 mg | Freq: Once | INTRAMUSCULAR | Status: AC
  Administered 2020-01-13: 0.25 mg via INTRAVENOUS
  Filled 2020-01-13: qty 1

## 2020-01-13 MED ORDER — FOLIC ACID 1 MG PO TABS
2.0000 mg | ORAL_TABLET | Freq: Every day | ORAL | Status: DC
  Administered 2020-01-13 – 2020-01-20 (×8): 2 mg via ORAL
  Filled 2020-01-13 (×9): qty 2

## 2020-01-13 NOTE — ED Notes (Signed)
Pt work of breathing increasing, labored, anxious, this nurse notified attending Tamala Julian.

## 2020-01-13 NOTE — H&P (Addendum)
History and Physical    Sophia Ward DOB: 06-18-1932 DOA: 01/13/2020  Referring MD/NP/PA: Carmin Muskrat, MD PCP: [name], MD  Patient coming from: Home via EMS  Chief Complaint: Shortness of breath and cough  I have personally briefly reviewed patient's old medical records in [place]   HPI: Sophia Ward is a 86 y.o. female with medical history significant of HTN, HLD, PAF, CAD s/p stent, AS, DM type II, RA on MTX, PMR on chronic prednisone, and spinal stenosis presents with complaints of shortness of breath and cough.  Symptoms started approximately 3 days ago with nausea, vomiting, and diarrhea.  She reports chronically deals with diarrhea, but has been having four stools per day. Denies any blood in emesis or diarrhea.  Patient has had mostly nonproductive cough.  Notes associated symptoms of intermittent wheezing, fevers up to 100.6 F at home, chills, and generalized malaise.   However, yesterday evening she felt more short of breath and was unable to rest.  She had received both of her Covid-19 vaccines, but had had a recent exposure.  Normally patient does not require oxygen.  She had also recently had a capsule endoscopy done with [facility] last week with Dr. Luan Pulling.  In route with EMS patient's O2 saturations initially noted to be 68% on room air with bilateral rales.  Given 1 g of Tylenol, 1 sublingual nitroglycerin, and placed on CPAP.  O2 sats reportedly improved to 91% and heart rate is went from 140 down to 110.  ED Course: Upon admission to the emergency department patient was seen to be afebrile with respirations 19-28, blood pressure 94/46 to 107/47, and O2 saturations initially maintained on 15 L nonrebreather.  Labs significant for WBC 18.1, hemoglobin 9.1, platelets 435, potassium 3.2, CO2 16, BUN 18, creatinine 1.36, glucose 226, anion gap 18, and lactic acid 3.6.  Chest x-ray showing patchy opacity concerning for atypical pneumonia  more so than edema.  Sepsis protocol was initiated with bolus 2.5 L bolus of lactated Ringer's, Rocephin, and azithromycin.  Review of Systems  Constitutional: Positive for chills, fever and malaise/fatigue.  HENT: Negative for ear discharge and ear pain.   Eyes: Negative for photophobia and pain.  Respiratory: Positive for cough, sputum production, shortness of breath and wheezing.   Cardiovascular: Negative for chest pain and leg swelling.  Gastrointestinal: Positive for abdominal pain, nausea and vomiting.  Genitourinary: Negative for dysuria and hematuria.  Musculoskeletal: Positive for myalgias. Negative for falls.  Skin: Negative for rash.  Neurological: Negative for focal weakness and loss of consciousness.  Endo/Heme/Allergies: Bruises/bleeds easily.  Psychiatric/Behavioral: Negative for memory loss and substance abuse.    Past Medical History:  Diagnosis Date  . Allergic rhinitis   . Anemia 09/2019  . Arthritis    hands  . BPPV (benign paroxysmal positional vertigo)   . Coronary artery disease   . Coronary atherosclerosis of native coronary artery 01/30/2014  . Diabetes mellitus without complication ([place])   . Essential hypertension, benign 01/30/2014  . GERD (gastroesophageal reflux disease)   . Hypertension   . Mixed hyperlipidemia 01/30/2014  . Myocardial infarction ([place]) 2004   mild MI-no damage- had stents  . Osteopenia   . Post-menopausal   . Rheumatoid arthritis ([place])   . Spinal stenosis   . Stenosing tenosynovitis     Past Surgical History:  Procedure Laterality Date  . ABDOMINAL HYSTERECTOMY    . APPENDECTOMY    . BIOPSY  10/25/2019   Procedure: BIOPSY;  Surgeon: Otis Brace, MD;  Location: [facility] ENDOSCOPY;  Service: Gastroenterology;;  . BIOPSY  10/26/2019   Procedure: BIOPSY;  Surgeon: Otis Brace, MD;  Location: [facility] ENDOSCOPY;  Service: Gastroenterology;;  . CARDIAC CATHETERIZATION  2004  . carpel tunnel    . COLONOSCOPY WITH PROPOFOL N/A 10/30/2012    Procedure: COLONOSCOPY WITH PROPOFOL;  Surgeon: Garlan Fair, MD;  Location: [facility] ENDOSCOPY;  Service: Endoscopy;  Laterality: N/A;  . COLONOSCOPY WITH PROPOFOL N/A 10/26/2019   Procedure: COLONOSCOPY WITH PROPOFOL;  Surgeon: Otis Brace, MD;  Location: [place];  Service: Gastroenterology;  Laterality: N/A;  . CORONARY ANGIOPLASTY  2004  . DILATION AND CURETTAGE OF UTERUS    . ESOPHAGOGASTRODUODENOSCOPY N/A 10/25/2019   Procedure: ESOPHAGOGASTRODUODENOSCOPY (EGD);  Surgeon: Otis Brace, MD;  Location: [facility] ENDOSCOPY;  Service: Gastroenterology;  Laterality: N/A;  . ESOPHAGOGASTRODUODENOSCOPY (EGD) WITH PROPOFOL N/A 10/30/2012   Procedure: ESOPHAGOGASTRODUODENOSCOPY (EGD) WITH PROPOFOL;  Surgeon: Garlan Fair, MD;  Location: [facility] ENDOSCOPY;  Service: Endoscopy;  Laterality: N/A;  . EYE SURGERY     bilateral cataract with lens implant  . EYE SURGERY    . INJECTION OF SILICONE OIL Left 6/38/7564   Procedure: Injection Of Silicone Oil;  Surgeon: Jalene Mullet, MD;  Location: [place];  Service: Ophthalmology;  Laterality: Left;  . left shouler surgery    . PHOTOCOAGULATION WITH LASER Left 02/12/2019   Procedure: Photocoagulation With Laser;  Surgeon: Jalene Mullet, MD;  Location: [place];  Service: Ophthalmology;  Laterality: Left;  . 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 02/12/2019   Procedure: REPAIR OF COMPLEX TRACTION RETINAL DETACHMENT;  Surgeon: Jalene Mullet, MD;  Location: [place];  Service: Ophthalmology;  Laterality: Left;  . TONSILLECTOMY    . VITRECTOMY 25 GAUGE WITH SCLERAL BUCKLE Left 02/12/2019   Procedure: Vitrectomy 25 Gauge With Scleral Buckle;  Surgeon: Jalene Mullet, MD;  Location: [place];  Service: Ophthalmology;  Laterality: Left;     reports that she has never smoked. She has never used smokeless tobacco. She reports that she does not drink alcohol and does not use drugs.  Allergies  Allergen Reactions  . Codeine Nausea And Vomiting    MAKES  HER SICK  . Glimepiride Other (See Comments)    Can't remember  . Jardiance [Empagliflozin] Other (See Comments)    Yeast  . Metformin And Related Diarrhea  . Penicillins Rash    Family History  Problem Relation Age of Onset  . CAD Mother   . Heart attack Mother   . CAD Father   . Heart Problems Brother        open heart surgery   . Arthritis Daughter        psoriatic arthritis     Prior to Admission medications   Medication Sig Start Date End Date Taking? Authorizing Provider  acetaminophen (TYLENOL) 650 MG CR tablet Take 1,300 mg by mouth at bedtime.    [provider]  amLODipine (NORVASC) 10 MG tablet Take 1 tablet (10 mg total) by mouth every morning. 11/11/19   Jettie Booze, MD  apixaban (ELIQUIS) 5 MG TABS tablet Take 1 tablet (5 mg total) by mouth 2 (two) times daily. 11/11/19   Jettie Booze, MD  atorvastatin (LIPITOR) 40 MG tablet Take 1 tablet (40 mg total) by mouth at bedtime. 04/30/19   Jettie Booze, MD  B-D ULTRAFINE III SHORT PEN 31G X 8 MM MISC 1 each by Other route in the morning, at noon, in the evening,  and at bedtime.  07/29/19   [provider]  benzonatate (TESSALON) 100 MG capsule Take 1 capsule (100 mg total) by mouth 3 (three) times daily. 10/27/19   Bonnielee Haff, MD  Calcium Carbonate-Vitamin D (CALCIUM + D PO) Take 1 tablet by mouth every morning.    [provider]  Cholecalciferol (VITAMIN D3) 125 MCG (5000 UT) CAPS Take 5,000 Units by mouth daily.     [provider]  estradiol (ESTRACE) 0.1 MG/GM vaginal cream Place 1 Applicatorful vaginally daily.  08/21/19   [provider]  folic acid (FOLVITE) 1 MG tablet Take 2 tablets (2 mg total) by mouth daily. 09/30/19   Ofilia Neas, PA-C  insulin lispro (HUMALOG KWIKPEN) 100 UNIT/ML KiwkPen Inject 6 Units into the skin 3 (three) times daily.     [provider]  LEVEMIR FLEXTOUCH 100 UNIT/ML Pen Inject 14 Units into the skin every  morning.  08/25/16   [provider]  methotrexate 2.5 MG tablet TAKE 5 TABLETS BY MOUTH ONCE A WEEK. Caution:Chemotherapy. Protect from light. 09/30/19   Ofilia Neas, PA-C  metoprolol succinate (TOPROL-XL) 100 MG 24 hr tablet Take 1 tablet (100 mg total) by mouth every morning. 11/28/19   Jettie Booze, MD  nitroGLYCERIN (NITROSTAT) 0.4 MG SL tablet DISSOLVE ONE TABLET UNDER THE TONGUE EVERY 5 MINUTES AS NEEDED FOR CHEST PAIN.  DO NOT EXCEED A TOTAL OF 3 DOSES IN 15 MINUTES 11/11/19   Jettie Booze, MD  Omega-3 Fatty Acids (FISH OIL PO) Take 1 capsule by mouth every morning.    [provider]  pantoprazole (PROTONIX) 40 MG tablet Take 1 tablet (40 mg total) by mouth daily after lunch. 10/27/19   Bonnielee Haff, MD  predniSONE (DELTASONE) 1 MG tablet Take 4 tablets (4 mg total) by mouth daily with breakfast. 11/05/19   Bo Merino, MD  ramipril (ALTACE) 10 MG tablet Take 10 mg by mouth 2 (two) times daily.     [provider]    Physical Exam:  Constitutional: Elderly female who appears ill but in no acute distress at this time Vitals:   01/13/20 0700 01/13/20 0715 01/13/20 0815 01/13/20 0915  BP: (!) 105/55 (!) 107/47 (!) 94/46 (!) 103/50  Pulse: 87 86 83 82  Resp: (!) 28 19 (!) 24 (!) 23  Temp:      TempSrc:      SpO2: 97% 100% 93% 94%  Weight:      Height:       Eyes: PERRL, lids and conjunctivae normal ENMT: Mucous membranes are dry. Posterior pharynx clear of any exudate or lesions.   Neck: normal, supple, no masses, no thyromegaly Respiratory:   Tachypneic with intermittent crackles appreciated on both lung fields.  No wheezes appreciated.  Currently on 5 L of oxygen and able to talk in complete sentences. Cardiovascular: Regular rate and rhythm, no murmurs / rubs / gallops.  Trace lower extremity edema. 2+ pedal pulses. No carotid bruits.  Abdomen: no tenderness, no masses palpated. No hepatosplenomegaly. Bowel sounds positive.    Musculoskeletal: no clubbing / cyanosis. No joint deformity upper and lower extremities. Good ROM, no contractures. Normal muscle tone.  Skin: no rashes, lesions, ulcers. No induration Neurologic: CN 2-12 grossly intact. Sensation intact, DTR normal. Strength 5/5 in all 4.  Psychiatric: Normal judgment and insight. Alert and oriented x 3. Normal mood.     Labs on Admission: I have personally reviewed following labs and imaging studies  CBC: Recent  Labs  Lab 01/13/20 0523  WBC 18.1*  NEUTROABS 16.5*  HGB 9.1*  HCT 31.8*  MCV 87.4  PLT 326*   Basic Metabolic Panel: Recent Labs  Lab 01/13/20 0523  NA 139  K 3.2*  CL 105  CO2 16*  GLUCOSE 226*  BUN 18  CREATININE 1.36*  CALCIUM 8.8*   GFR: Estimated Creatinine Clearance: 29.7 mL/min (A) (by C-G formula based on SCr of 1.36 mg/dL (H)). Liver Function Tests: Recent Labs  Lab 01/13/20 0523  AST 27  ALT 15  ALKPHOS 66  BILITOT 0.9  PROT 6.6  ALBUMIN 3.1*   No results for input(s): LIPASE, AMYLASE in the last 168 hours. No results for input(s): AMMONIA in the last 168 hours. Coagulation Profile: Recent Labs  Lab 01/13/20 0523  INR 1.5*   Cardiac Enzymes: No results for input(s): CKTOTAL, CKMB, CKMBINDEX, TROPONINI in the last 168 hours. BNP (last 3 results) No results for input(s): PROBNP in the last 8760 hours. HbA1C: No results for input(s): HGBA1C in the last 72 hours. CBG: No results for input(s): GLUCAP in the last 168 hours. Lipid Profile: No results for input(s): CHOL, HDL, LDLCALC, TRIG, CHOLHDL, LDLDIRECT in the last 72 hours. Thyroid Function Tests: No results for input(s): TSH, T4TOTAL, FREET4, T3FREE, THYROIDAB in the last 72 hours. Anemia Panel: No results for input(s): VITAMINB12, FOLATE, FERRITIN, TIBC, IRON, RETICCTPCT in the last 72 hours. Urine analysis:    Component Value Date/Time   COLORURINE STRAW (A) 10/23/2019 0820   APPEARANCEUR CLEAR 10/23/2019 0820   LABSPEC 1.010 10/23/2019  0820   PHURINE 7.0 10/23/2019 0820   GLUCOSEU NEGATIVE 10/23/2019 0820   HGBUR NEGATIVE 10/23/2019 0820   BILIRUBINUR NEGATIVE 10/23/2019 0820   KETONESUR 5 (A) 10/23/2019 0820   PROTEINUR NEGATIVE 10/23/2019 0820   NITRITE NEGATIVE 10/23/2019 0820   LEUKOCYTESUR NEGATIVE 10/23/2019 0820   Sepsis Labs: Recent Results (from the past 240 hour(s))  Blood Culture (routine x 2)     Status: None (Preliminary result)   Collection Time: 01/13/20  5:26 AM   Specimen: BLOOD RIGHT ARM  Result Value Ref Range Status   Specimen Description BLOOD RIGHT ARM  Final   Special Requests   Final    BOTTLES DRAWN AEROBIC AND ANAEROBIC BLOOD RIGHT ARM Performed at [facility], [place] [address], [place], [place] [address]    Culture PENDING  Incomplete   Report Status PENDING  Incomplete  Blood Culture (routine x 2)     Status: None (Preliminary result)   Collection Time: 01/13/20  5:31 AM   Specimen: BLOOD LEFT ARM  Result Value Ref Range Status   Specimen Description BLOOD LEFT ARM  Final   Special Requests   Final    BOTTLES DRAWN AEROBIC AND ANAEROBIC Blood Culture results may not be optimal due to an inadequate volume of blood received in culture bottles Performed at [facility], [place] [address], [place], [place] [address]    Culture PENDING  Incomplete   Report Status PENDING  Incomplete  SARS Coronavirus 2 by RT PCR (hospital order, performed in [facility] hospital lab) Nasopharyngeal Nasopharyngeal Swab     Status: None   Collection Time: 01/13/20  6:44 AM   Specimen: Nasopharyngeal Swab  Result Value Ref Range Status   SARS Coronavirus 2 NEGATIVE NEGATIVE Final    Comment: (NOTE) SARS-CoV-2 target nucleic acids are NOT DETECTED.  The SARS-CoV-2 RNA is generally detectable in upper and lower respiratory specimens during the acute  phase of infection. The lowest concentration of SARS-CoV-2 viral copies this assay can detect is 250 copies / mL. A negative result does not  preclude SARS-CoV-2 infection and should not be used as the sole basis for treatment or other patient management decisions.  A negative result may occur with improper specimen collection / handling, submission of specimen other than nasopharyngeal swab, presence of viral mutation(s) within the areas targeted by this assay, and inadequate number of viral copies (<250 copies / mL). A negative result must be combined with clinical observations, patient history, and epidemiological information.  Fact Sheet for Patients:   StrictlyIdeas.no  Fact Sheet for Healthcare Providers: BankingDealers.co.za  This test is not yet approved or  cleared by the [place] FDA and has been authorized for detection and/or diagnosis of SARS-CoV-2 by FDA under an Emergency Use Authorization (EUA).  This EUA will remain in effect (meaning this test can be used) for the duration of the COVID-19 declaration under Section 564(b)(1) of the Act, 21 U.S.C. section 360bbb-3(b)(1), unless the authorization is terminated or revoked sooner.  Performed at [facility], [place] [address], [place], [place] [address]      Radiological Exams on Admission: DG Chest Port 1 View  Result Date: 01/13/2020 CLINICAL DATA:  Shortness of breath EXAM: PORTABLE CHEST 1 VIEW COMPARISON:  10/27/2018 FINDINGS: Borderline heart size. Bilateral septal thickening and patchy airspace type opacity. No visible effusion or air leak. Atherosclerosis. IMPRESSION: Patchy pulmonary opacity, favor atypical pneumonia over CHF. Electronically Signed   By: Monte Fantasia M.D.   On: 01/13/2020 05:38    EKG: Independently reviewed.  Sinus rhythm at 97 bpm with ST depressions in inferior leads  Assessment/Plan Respiratory failure with hypoxia Sepsis secondary to pneumonia: Acute patient presented with complaints of shortness of breath.  Noted to be tachypneic and hypoxic down to 68% on room air.   WBC of 18.1 with lactic acid 3.6.  Sepsis protocol initiated chest x-ray shows a patchy pulmonary opacity concerning for atypical pneumonia.  COVID-19 screening was noted. -Admit to a progressive bed -Continuous pulse oximetry with nasal cannula oxygen to maintain O2 saturation -Follow-up blood cultures -Check respiratory virus panel -Check urine Legionella and pneumonia -Continue Rocephin and Azithromycin -Mucinex -Continue to trend lactic acid level  Transient hypotension: Acute. On admission blood pressures noted to be as low as 94/46 with maps of 61.  Blood pressures improved with IV fluids.  She is on chronic steroids, but had been given nitroglycerin in route with EMS which likely be the cause of low blood pressures. -Hold home blood pressure medications -Restart when medically appropriate  Acute kidney injury: Patient presents with creatinine of 1.36 with BUN 18.  Baseline creatinine previously noted to be around 0.8-1.  She was given 2.5 L of normal saline IV fluids.  She reports a prior history of recurrent UTIs. -Follow-up urinalysis and check urine culture if needed -Recheck kidney function in a.m.  Hypokalemia: Acute.  Initial potassium noted to be 3.2 on admission. -Give 40 mEq of potassium chloride x1 dose now -Continue to monitor and replace as needed  Insulin-dependent diabetes mellitus: On admission initial glucose 226.  Last hemoglobin A1c 7.2 on 10/22/2019. Home medications include Levemir 14 units daily a.m. and 6 units 3 times daily with meals.  -Hypoglycemic protocol -CBGs qAC continuing home regimen -Consider adding on additional sliding scale insulin with meals if needed  Iron deficiency anemia: Hemoglobin 9.1 with MCH 25.  Last hospitalization iron levels with low at  16.  Patient had been scheduled for iron infusion today, but had to be canceled due to patient being in the hospital. -Give Feraheme injection tomorrow morning -Continue to monitor  Aortic  stenosis Diastolic dysfunction: Chronic.  Patient appears to be euvolemic at this time.  Last EF noted to be 60 to 65% with severe aortic stenosis noted.  Patient follows with Dr. Irish [facility] of Cardiology in the outpatient setting. -Strict intake and output -Daily weights  Paroxysmal atrial fibrillation on chronic anticoagulation: Diagnosed during last hospitalization in May of this year.  Patient appears to be in sinus rhythm at this time with heart rates controlled. -Continue Eliquis   Rheumatoid arthritis polymyalgia rheumatica on chronic immunosuppressive therapy: Home medications include methotrexate 12.5 mg every Wednesday and 40 mg of prednisone daily. -Continue methotrexate and prednisone  Hyperlipidemia: Home medications include atorvastatin 40 mg nightly. -Continue current regimen  GERD -Continue protonix  Obesity: BMI 31.28 kg/m  DVT prophylaxis: Eliquis Code Status: full Family Communication: Daughter updated at bedside Disposition Plan: TBD Consults called: none Admission status: inpatient   Norval Morton MD [facility] Pager [phone]   If 7PM-7AM, please contact night-coverage [email or web address] Password [facility]  01/13/2020, 9:35 AM

## 2020-01-13 NOTE — ED Provider Notes (Signed)
9:12 AM Care of the patient assumed at signout. Now, on my first evaluation the patient she is only on nasal cannula, 5 L, saturation is appropriate, she is smiling, states that she feels markedly better.  She is aware of all findings thus far, need for admission with concern for pneumonia, sepsis. Covid test negative.   Carmin Muskrat, MD 01/13/20 [phone]

## 2020-01-13 NOTE — ED Provider Notes (Signed)
[facility] [facility] EMERGENCY DEPARTMENT Provider Note   CSN: [id number] Arrival date & time:        History Chief Complaint  Patient presents with  . Shortness of Breath  . Fever   Level 5 caveat due to acuity of condition Sophia Ward is a 86 y.o. female.  The history is provided by the patient, the EMS personnel, a relative and a caregiver. The history is limited by the condition of the patient.  Shortness of Breath Severity:  Severe Onset quality:  Sudden Timing:  Constant Progression:  Worsening Chronicity:  New Relieved by:  Oxygen Worsened by:  Nothing Associated symptoms: fever and vomiting   Fever Associated symptoms: chills, myalgias and vomiting   Patient with history of CAD, diabetes, hypertension, aortic stenosis, atrial fibrillation on Eliquis presents with fever and cough, as well as vomiting and diarrhea.  Patient reports the past 3 days she has had vomiting and diarrhea.  For the past 24 hours she has had increasing cough and shortness of breath.  Tonight she woke up felt more short of breath.  EMS was called and she was noted be 68% on room air.  Patient has no known underlying lung disease.  Patient does report a recent history of anemia and has an ongoing work-up.  No recent black or bloody stools Patient is fully vaccinated for COVID-19.  She did have a Covid exposure recently    Past Medical History:  Diagnosis Date  . Allergic rhinitis   . Anemia 09/2019  . Arthritis    hands  . BPPV (benign paroxysmal positional vertigo)   . Coronary artery disease   . Coronary atherosclerosis of native coronary artery 01/30/2014  . Diabetes mellitus without complication ([place])   . Essential hypertension, benign 01/30/2014  . GERD (gastroesophageal reflux disease)   . Hypertension   . Mixed hyperlipidemia 01/30/2014  . Myocardial infarction ([place]) 2004   mild MI-no damage- had stents  . Osteopenia   . Post-menopausal   . Rheumatoid arthritis ([place])     . Spinal stenosis   . Stenosing tenosynovitis     Patient Active Problem List   Diagnosis Date Noted  . Iron deficiency anemia due to chronic blood loss 01/06/2020  . Symptomatic anemia 10/23/2019  . Anemia 10/22/2019  . Polymyalgia rheumatica ([place]) 11/25/2016  . Primary osteoarthritis of both hands 11/25/2016  . Bilateral primary osteoarthritis of knee 11/25/2016  . Osteopenia of multiple sites 11/25/2016  . 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 Osteoarthritis of lumbar spine 10/28/2016  . History of diabetes mellitus 10/27/2016  . History of gastroesophageal reflux (GERD) 10/27/2016  . Rheumatoid arthritis involving multiple sites with positive rheumatoid factor ([place]) 10/14/2016  . Elevated C-reactive protein (CRP) 10/14/2016  . Elevated sed rate 10/14/2016  . High risk medication use 10/14/2016  . Coronary atherosclerosis of native coronary artery 01/30/2014  . Mixed hyperlipidemia 01/30/2014  . Essential hypertension, benign 01/30/2014  . Obesity, unspecified 01/30/2014    Past Surgical History:  Procedure Laterality Date  . ABDOMINAL HYSTERECTOMY    . APPENDECTOMY    . BIOPSY  10/25/2019   Procedure: BIOPSY;  Surgeon: Otis Brace, MD;  Location: [facility];  Service: Gastroenterology;;  . BIOPSY  10/26/2019   Procedure: BIOPSY;  Surgeon: Otis Brace, MD;  Location: [facility];  Service: Gastroenterology;;  . CARDIAC CATHETERIZATION  2004  . carpel tunnel    . COLONOSCOPY WITH PROPOFOL N/A 10/30/2012   Procedure: COLONOSCOPY WITH PROPOFOL;  Surgeon: Garlan Fair, MD;  Location: [facility];  Service: Endoscopy;  Laterality: N/A;  . COLONOSCOPY WITH PROPOFOL N/A 10/26/2019   Procedure: COLONOSCOPY WITH PROPOFOL;  Surgeon: Otis Brace, MD;  Location: [facility];  Service: Gastroenterology;  Laterality: N/A;  . CORONARY ANGIOPLASTY  2004  . DILATION AND CURETTAGE OF UTERUS    . ESOPHAGOGASTRODUODENOSCOPY N/A 10/25/2019    Procedure: ESOPHAGOGASTRODUODENOSCOPY (EGD);  Surgeon: Otis Brace, MD;  Location: [facility] ENDOSCOPY;  Service: Gastroenterology;  Laterality: N/A;  . ESOPHAGOGASTRODUODENOSCOPY (EGD) WITH PROPOFOL N/A 10/30/2012   Procedure: ESOPHAGOGASTRODUODENOSCOPY (EGD) WITH PROPOFOL;  Surgeon: Garlan Fair, MD;  Location: [facility] ENDOSCOPY;  Service: Endoscopy;  Laterality: N/A;  . EYE SURGERY     bilateral cataract with lens implant  . EYE SURGERY    . INJECTION OF SILICONE OIL Left 06/29/8655   Procedure: Injection Of Silicone Oil;  Surgeon: Jalene Mullet, MD;  Location: [facility];  Service: Ophthalmology;  Laterality: Left;  . left shouler surgery    . PHOTOCOAGULATION WITH LASER Left 02/12/2019   Procedure: Photocoagulation With Laser;  Surgeon: Jalene Mullet, MD;  Location: [facility];  Service: Ophthalmology;  Laterality: Left;  . 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 02/12/2019   Procedure: REPAIR OF COMPLEX TRACTION RETINAL DETACHMENT;  Surgeon: Jalene Mullet, MD;  Location: [place] [facility];  Service: Ophthalmology;  Laterality: Left;  . TONSILLECTOMY    . VITRECTOMY 25 GAUGE WITH SCLERAL BUCKLE Left 02/12/2019   Procedure: Vitrectomy 25 Gauge With Scleral Buckle;  Surgeon: Jalene Mullet, MD;  Location: [facility];  Service: Ophthalmology;  Laterality: Left;     OB History   No obstetric history on file.     Family History  Problem Relation Age of Onset  . CAD Mother   . Heart attack Mother   . CAD Father   . Heart Problems Brother        open heart surgery   . Arthritis Daughter        psoriatic arthritis     Social History   Tobacco Use  . Smoking status: Never Smoker  . Smokeless tobacco: Never Used  Vaping Use  . Vaping Use: Never used  Substance Use Topics  . Alcohol use: No  . Drug use: No    Home Medications Prior to Admission medications   Medication Sig Start Date End Date Taking? Authorizing Provider  acetaminophen (TYLENOL) 650 MG CR tablet Take 1,300 mg by mouth at  bedtime.    [provider]  amLODipine (NORVASC) 10 MG tablet Take 1 tablet (10 mg total) by mouth every morning. 11/11/19   Jettie Booze, MD  apixaban (ELIQUIS) 5 MG TABS tablet Take 1 tablet (5 mg total) by mouth 2 (two) times daily. 11/11/19   Jettie Booze, MD  atorvastatin (LIPITOR) 40 MG tablet Take 1 tablet (40 mg total) by mouth at bedtime. 04/30/19   Jettie Booze, MD  B-D ULTRAFINE III SHORT PEN 31G X 8 MM MISC 1 each by Other route in the morning, at noon, in the evening, and at bedtime.  07/29/19   [provider]  benzonatate (TESSALON) 100 MG capsule Take 1 capsule (100 mg total) by mouth 3 (three) times daily. 10/27/19   Bonnielee Haff, MD  Calcium Carbonate-Vitamin D (CALCIUM + D PO) Take 1 tablet by mouth every morning.    [provider]  Cholecalciferol (VITAMIN D3) 125 MCG (5000 UT) CAPS Take 5,000 Units by mouth daily.     [provider]  estradiol (ESTRACE) 0.1 MG/GM vaginal cream Place 1 Applicatorful vaginally daily.  08/21/19   [provider]  folic acid (FOLVITE) 1 MG tablet Take 2 tablets (2 mg total) by mouth daily. 09/30/19   Ofilia Neas, PA-C  insulin lispro (HUMALOG KWIKPEN) 100 UNIT/ML KiwkPen Inject 6 Units into the skin 3 (three) times daily.     [provider]  LEVEMIR FLEXTOUCH 100 UNIT/ML Pen Inject 14 Units into the skin every morning.  08/25/16   [provider]  methotrexate 2.5 MG tablet TAKE 5 TABLETS BY MOUTH ONCE A WEEK. Caution:Chemotherapy. Protect from light. 09/30/19   Ofilia Neas, PA-C  metoprolol succinate (TOPROL-XL) 100 MG 24 hr tablet Take 1 tablet (100 mg total) by mouth every morning. 11/28/19   Jettie Booze, MD  nitroGLYCERIN (NITROSTAT) 0.4 MG SL tablet DISSOLVE ONE TABLET UNDER THE TONGUE EVERY 5 MINUTES AS NEEDED FOR CHEST PAIN.  DO NOT EXCEED A TOTAL OF 3 DOSES IN 15 MINUTES 11/11/19   Jettie Booze, MD  Omega-3 Fatty Acids (FISH OIL PO) Take 1  capsule by mouth every morning.    [provider]  pantoprazole (PROTONIX) 40 MG tablet Take 1 tablet (40 mg total) by mouth daily after lunch. 10/27/19   Bonnielee Haff, MD  predniSONE (DELTASONE) 1 MG tablet Take 4 tablets (4 mg total) by mouth daily with breakfast. 11/05/19   Bo Merino, MD  ramipril (ALTACE) 10 MG tablet Take 10 mg by mouth 2 (two) times daily.     [provider]    Allergies    Codeine, Glimepiride, Jardiance [empagliflozin], Metformin and related, and Penicillins  Review of Systems   Review of Systems  Unable to perform ROS: Acuity of condition  Constitutional: Positive for chills, fatigue and fever.  Respiratory: Positive for shortness of breath.   Gastrointestinal: Positive for vomiting.  Musculoskeletal: Positive for myalgias.    Physical Exam Updated Vital Signs Pulse 98   Temp 98.5 F (36.9 C) (Oral)   Resp 10   Ht 1.575 m (5\' 2" )   Wt 77.6 kg   SpO2 100%   BMI 31.28 kg/m   Physical Exam CONSTITUTIONAL: Elderly, ill-appearing HEAD: Normocephalic/atraumatic EYES: EOMI/PERRL ENMT: Mucous membranes moist NECK: supple no meningeal signs SPINE/BACK:entire spine nontender CV: S1/S2 noted  LUNGS: Crackles bilaterally, right> left mild tachypnea.  Patient is on 15 L of oxygen ABDOMEN: soft, nontender, no rebound or guarding, bowel sounds noted throughout abdomen GU:no cva tenderness NEURO: Pt is awake/alert/appropriate, moves all extremitiesx4.  No facial droop.   EXTREMITIES: pulses normal/equal, full ROM SKIN: warm, color normal PSYCH: no abnormalities of mood noted, alert and oriented to situation  ED Results / Procedures / Treatments   Labs (all labs ordered are listed, but only abnormal results are displayed) Labs Reviewed  LACTIC ACID, PLASMA - Abnormal; Notable for the following components:      Result Value   Lactic Acid, Venous 3.6 (*)    All other components within normal limits  COMPREHENSIVE METABOLIC PANEL  - Abnormal; Notable for the following components:   Potassium 3.2 (*)    CO2 16 (*)    Glucose, Bld 226 (*)    Creatinine, Ser 1.36 (*)    Calcium 8.8 (*)    Albumin 3.1 (*)    GFR calc non Af Amer 36 (*)    GFR calc Af Amer 41 (*)    Anion gap 18 (*)    All other components within normal  limits  CBC WITH DIFFERENTIAL/PLATELET - Abnormal; Notable for the following components:   WBC 18.1 (*)    RBC 3.64 (*)    Hemoglobin 9.1 (*)    HCT 31.8 (*)    MCH 25.0 (*)    MCHC 28.6 (*)    RDW 22.5 (*)    Platelets 435 (*)    Neutro Abs 16.5 (*)    Lymphs Abs 0.6 (*)    Abs Immature Granulocytes 0.11 (*)    All other components within normal limits  PROTIME-INR - Abnormal; Notable for the following components:   Prothrombin Time 17.5 (*)    INR 1.5 (*)    All other components within normal limits  URINE CULTURE  CULTURE, BLOOD (ROUTINE X 2)  CULTURE, BLOOD (ROUTINE X 2)  SARS CORONAVIRUS 2 BY RT PCR (HOSPITAL ORDER, [facility])  APTT  LACTIC ACID, PLASMA  URINALYSIS, ROUTINE W REFLEX MICROSCOPIC  BRAIN NATRIURETIC PEPTIDE    EKG EKG Interpretation  Date/Time:  Monday January 13 2020 05:20:28 EDT Ventricular Rate:  97 PR Interval:    QRS Duration: 88 QT Interval:  370 QTC Calculation: 470 R Axis:   75 Text Interpretation: Sinus rhythm ST depr, consider ischemia, inferior leads Abnormal ekg changed from prior Confirmed by Ripley Fraise ([id number]) on 01/13/2020 5:27:45 AM   Radiology DG Chest Port 1 View  Result Date: 01/13/2020 CLINICAL DATA:  Shortness of breath EXAM: PORTABLE CHEST 1 VIEW COMPARISON:  10/27/2018 FINDINGS: Borderline heart size. Bilateral septal thickening and patchy airspace type opacity. No visible effusion or air leak. Atherosclerosis. IMPRESSION: Patchy pulmonary opacity, favor atypical pneumonia over CHF. Electronically Signed   By: Monte Fantasia M.D.   On: 01/13/2020 05:38    Procedures .Critical Care Performed by:  Ripley Fraise, MD Authorized by: Ripley Fraise, MD   Critical care provider statement:    Critical care time (minutes):  45   Critical care start time:  01/13/2020 5:35 AM   Critical care end time:  01/13/2020 6:20 AM   Critical care time was exclusive of:  Separately billable procedures and treating other patients   Critical care was necessary to treat or prevent imminent or life-threatening deterioration of the following conditions:  Respiratory failure and sepsis   Critical care was time spent personally by me on the following activities:  Development of treatment plan with patient or surrogate, evaluation of patient's response to treatment, examination of patient, obtaining history from patient or surrogate, re-evaluation of patient's condition, pulse oximetry, ordering and review of radiographic studies, ordering and review of laboratory studies and ordering and performing treatments and interventions   I assumed direction of critical care for this patient from another provider in my specialty: no        Medications Ordered in ED Medications  lactated ringers bolus 1,000 mL (has no administration in time range)    And  lactated ringers bolus 1,000 mL (1,000 mLs Intravenous New Bag/Given 01/13/20 0706)    And  lactated ringers bolus 500 mL (has no administration in time range)  lactated ringers infusion (has no administration in time range)  cefTRIAXone (ROCEPHIN) 1 g in sodium chloride 0.9 % 100 mL IVPB (has no administration in time range)  azithromycin (ZITHROMAX) 500 mg in sodium chloride 0.9 % 250 mL IVPB (has no administration in time range)    ED Course  I have reviewed the triage vital signs and the nursing notes.  Pertinent labs & imaging results that were available  during my care of the patient were reviewed by me and considered in my medical decision making (see chart for details).    MDM Rules/Calculators/A&P                         5:36 AM Patient reports recent  viral illness including vomiting and diarrhea.  For the past 24 hours she had increasing cough, shortness of breath and was hypoxic tonight. Patient denies any pain complaints, but does report fatigue and chills.  She reports febrile at home up to T-max 100.6 Labs and imaging are pending at this time 6:21 AM Daughter now reports pt has diarrhea frequently and she only vomited once. She reports cough/fever are new 6:52 AM Patient overall appears improved.  I have scaled back her oxygen at 5 L/min and no hypoxia.  Heart rate is improved, blood pressure is improving. Lactate is elevated, will initiate code sepsis and start IV antibiotics and fluids.  COVID-19 testing pending 7:07 AM Signed out to dr lockwood at shift change   This patient presents to the ED for concern of shortness of breath, this involves an extensive number of treatment options, and is a complaint that carries with it a high risk of complications and morbidity.  The differential diagnosis includes COVID-19, pneumonia, CHF, pulmonary embolism, acute coronary syndrome   Lab Tests:   I Ordered, reviewed, and interpreted labs, which included electrolytes, complete blood count, COVID-19 testing, BNP  Medicines ordered:   I ordered medication IV antibiotics/fluids  For sepsis   Imaging Studies ordered:   I ordered imaging studies which included chest x-ray   I independently visualized and interpreted imaging which showed pneumonia  Additional history obtained:   Additional history obtained from daughter  Previous records obtained and reviewed      Final Clinical Impression(s) / ED Diagnoses Final diagnoses:  Hypoxia  Community acquired pneumonia, unspecified laterality  Dehydration  Hyperglycemia    Rx / DC Orders ED Discharge Orders    None       Ripley Fraise, MD 01/13/20 [phone]

## 2020-01-13 NOTE — ED Notes (Signed)
Attending at bedside.

## 2020-01-13 NOTE — Progress Notes (Signed)
Patient off BIPAP at this time. Patient in no distress and currently comfortable on HFNC. RT will continue to monitor.

## 2020-01-13 NOTE — ED Notes (Signed)
Pt's CBG result was 180. Informed Caryl Pina - RN.

## 2020-01-13 NOTE — ED Notes (Signed)
Attending Tamala Julian, at bedside

## 2020-01-13 NOTE — Telephone Encounter (Signed)
Patient's spouse called and informed me she will be missing her appointments today due to her being hospitalized. Patient will call when ready to reschedule.

## 2020-01-13 NOTE — ED Triage Notes (Signed)
Per EMS, pt from home w/ spouse, called out for fever, vomiting and loose stools X3 days.  Initially found to be on 68% RA, w/ bilateral rales.  No hx of CHF or COPD.    Given 1g Tylenol and .4 nitro SL.  Placed on Cpap, O2 improved to 91%, heart rate went from 140 to 110.  CBG 262

## 2020-01-13 NOTE — ED Notes (Signed)
RT at bedside.

## 2020-01-14 LAB — LEGIONELLA PNEUMOPHILA SEROGP 1 UR AG: L. pneumophila Serogp 1 Ur Ag: NEGATIVE

## 2020-01-14 LAB — RESPIRATORY PANEL BY PCR

## 2020-01-14 LAB — BASIC METABOLIC PANEL
Anion gap: 11 (ref 5–15)
BUN: 20 mg/dL (ref 8–23)
CO2: 23 mmol/L (ref 22–32)
Calcium: 8.5 mg/dL — ABNORMAL LOW (ref 8.9–10.3)
Chloride: 104 mmol/L (ref 98–111)
Creatinine, Ser: 1.06 mg/dL — ABNORMAL HIGH (ref 0.44–1.00)
GFR calc Af Amer: 56 mL/min — ABNORMAL LOW (ref >60)
GFR calc non Af Amer: 48 mL/min — ABNORMAL LOW (ref >60)
Glucose, Bld: 232 mg/dL — ABNORMAL HIGH (ref 70–99)
Potassium: 4.7 mmol/L (ref 3.5–5.1)
Sodium: 138 mmol/L (ref 135–145)

## 2020-01-14 LAB — CBC
HCT: 27.3 % — ABNORMAL LOW (ref 36.0–46.0)
Hemoglobin: 7.8 g/dL — ABNORMAL LOW (ref 12.0–15.0)
MCH: 25 pg — ABNORMAL LOW (ref 26.0–34.0)
MCHC: 28.6 g/dL — ABNORMAL LOW (ref 30.0–36.0)
MCV: 87.5 fL (ref 80.0–100.0)
Platelets: 344 10*3/uL (ref 150–400)
RBC: 3.12 MIL/uL — ABNORMAL LOW (ref 3.87–5.11)
RDW: 22.1 % — ABNORMAL HIGH (ref 11.5–15.5)
WBC: 22.6 10*3/uL — ABNORMAL HIGH (ref 4.0–10.5)
nRBC: 0 % (ref 0.0–0.2)

## 2020-01-14 LAB — GLUCOSE, CAPILLARY
Glucose-Capillary: 194 mg/dL — ABNORMAL HIGH (ref 70–99)
Glucose-Capillary: 204 mg/dL — ABNORMAL HIGH (ref 70–99)

## 2020-01-14 LAB — CBG MONITORING, ED
Glucose-Capillary: 216 mg/dL — ABNORMAL HIGH (ref 70–99)
Glucose-Capillary: 256 mg/dL — ABNORMAL HIGH (ref 70–99)

## 2020-01-14 MED ORDER — INSULIN ASPART 100 UNIT/ML ~~LOC~~ SOLN
0.0000 [IU] | Freq: Three times a day (TID) | SUBCUTANEOUS | Status: DC
  Administered 2020-01-15 – 2020-01-17 (×7): 2 [IU] via SUBCUTANEOUS
  Administered 2020-01-18: 5 [IU] via SUBCUTANEOUS
  Administered 2020-01-19: 2 [IU] via SUBCUTANEOUS
  Administered 2020-01-20: 3 [IU] via SUBCUTANEOUS

## 2020-01-14 MED ORDER — INSULIN ASPART 100 UNIT/ML ~~LOC~~ SOLN
0.0000 [IU] | Freq: Every day | SUBCUTANEOUS | Status: DC
  Administered 2020-01-14: 2 [IU] via SUBCUTANEOUS

## 2020-01-14 NOTE — ED Notes (Signed)
Report attempted, 3E unable to take at this time 

## 2020-01-14 NOTE — Progress Notes (Signed)
Sophia Ward  EQA:[id number] DOB: [birth year] DOA: 01/13/2020 PCP: [name], MD    Brief Narrative:  86 year old with a history of HTN, HLD, PAF, CAD status post stents, aortic stenosis, DM 2, rheumatoid arthritis on methotrexate, PMR on chronic prednisone, and spinal stenosis who presented with 3 days of progressively worsening shortness of breath, nausea, vomiting, and diarrhea.  She admits to having chronic diarrhea but noted increased frequency.  She noted that she has been fully vaccinated but had a recent Covid exposure.  EMS was summoned and the patient's initial room air saturation was 68%.  In the ED chest x-ray revealed patchy pulmonary opacities suggestive of atypical pneumonia.  Significant Events:  8/16 admit via [facility]  Subjective: Oxygen saturations now stable/much improved.  Blood pressure stabilizing.  Patient states she feels better in general and has no new complaints at the time of my exam.  She denies chest pain or abdominal pain.  Antimicrobials:  Azithromycin 8/16 > Ceftriaxone 8/16 >  DVT prophylaxis: Eliquis  Assessment & Plan:  Acute hypoxic respiratory failure -pneumonia -sepsis POA Continue empiric antibiotic therapy  Hypotension Blood pressures 94/46 at time of admission -improved with volume resuscitation  Acute kidney injury Creatinine 1.36 at time of presentation with baseline creatinine 0.8 -improving with volume expansion  Hypokalemia Corrected with supplementation  DM 2 CBG not at goal -adjust treatment regimen and follow trend  Chronic iron deficiency anemia Has been loaded with IV iron, which was scheduled prior to her admission, during this admission  Aortic stenosis Clinically compensated at present  Chronic diastolic CHF No evidence of volume overload at present  Chronic paroxysmal atrial fibrillation on chronic anticoagulation Continue Eliquis -rate controlled  Rheumatoid arthritis /polymyalgia rheumatica On  chronic immunosuppressive therapy to include methotrexate and steroid  HLD  GERD  Obesity - Body mass index is 31.28 kg/m.   Code Status: FULL CODE Family Communication:  Status is: Inpatient  Remains inpatient appropriate because:Inpatient level of care appropriate due to severity of illness   Dispo: The patient is from: Home              Anticipated d/c is to: Home              Anticipated d/c date is: 2 days              Patient currently is not medically stable to d/c.  Consultants:  none  Objective: Blood pressure 112/69, pulse (!) 36, temperature 98.2 F (36.8 C), temperature source Oral, resp. rate (!) 33, height 5\' 2"  (1.575 m), weight 77.6 kg, SpO2 90 %.  Intake/Output Summary (Last 24 hours) at 01/14/2020 1101 Last data filed at 01/14/2020 1050 Gross per 24 hour  Intake 3 ml  Output 495 ml  Net -492 ml   Filed Weights   01/13/20 0521  Weight: 77.6 kg    Examination: General: No acute respiratory distress Lungs: Bibasilar crackles with no wheezing Cardiovascular: Regular rate without gallop or rub normal S1 and S2 Abdomen: Nontender, nondistended, soft, bowel sounds positive, no rebound, no ascites, no appreciable mass Extremities: No significant cyanosis, clubbing, or edema bilateral lower extremities  CBC: Recent Labs  Lab 01/13/20 0523 01/14/20 0403  WBC 18.1* 22.6*  NEUTROABS 16.5*  --   HGB 9.1* 7.8*  HCT 31.8* 27.3*  MCV 87.4 87.5  PLT 435* 979   Basic Metabolic Panel: Recent Labs  Lab 01/13/20 0523 01/14/20 0403  NA 139 138  K 3.2* 4.7  CL 105 104  CO2 16* 23  GLUCOSE 226* 232*  BUN 18 20  CREATININE 1.36* 1.06*  CALCIUM 8.8* 8.5*   GFR: Estimated Creatinine Clearance: 38.1 mL/min (A) (by C-G formula based on SCr of 1.06 mg/dL (H)).  Liver Function Tests: Recent Labs  Lab 01/13/20 0523  AST 27  ALT 15  ALKPHOS 66  BILITOT 0.9  PROT 6.6  ALBUMIN 3.1*    Coagulation Profile: Recent Labs  Lab 01/13/20 0523  INR  1.5*    HbA1C: Hgb A1c MFr Bld  Date/Time Value Ref Range Status  01/13/2020 10:40 AM 5.7 (H) 4.8 - 5.6 % Final    Comment:    (NOTE) Pre diabetes:          5.7%-6.4%  Diabetes:              >6.4%  Glycemic control for   <7.0% adults with diabetes   10/22/2019 09:58 PM 7.2 (H) 4.8 - 5.6 % Final    Comment:    (NOTE) Pre diabetes:          5.7%-6.4% Diabetes:              >6.4% Glycemic control for   <7.0% adults with diabetes     CBG: Recent Labs  Lab 01/13/20 1107 01/13/20 1342 01/13/20 1620 01/13/20 2347 01/14/20 0800  GLUCAP 180* 151*  151* 189* 243* 216*    Recent Results (from the past 240 hour(s))  Blood Culture (routine x 2)     Status: None (Preliminary result)   Collection Time: 01/13/20  5:26 AM   Specimen: BLOOD RIGHT ARM  Result Value Ref Range Status   Specimen Description BLOOD RIGHT ARM  Final   Special Requests   Final    BOTTLES DRAWN AEROBIC AND ANAEROBIC BLOOD RIGHT ARM   Culture   Final    NO GROWTH < 24 HOURS Performed at [facility], [place] [address] [place], [place] [address]    Report Status PENDING  Incomplete  Blood Culture (routine x 2)     Status: None (Preliminary result)   Collection Time: 01/13/20  5:31 AM   Specimen: BLOOD LEFT ARM  Result Value Ref Range Status   Specimen Description BLOOD LEFT ARM  Final   Special Requests   Final    BOTTLES DRAWN AEROBIC AND ANAEROBIC Blood Culture results may not be optimal due to an inadequate volume of blood received in culture bottles   Culture   Final    NO GROWTH < 24 HOURS Performed at [facility], [place] [address], [place], [place] [address]    Report Status PENDING  Incomplete  SARS Coronavirus 2 by RT PCR (hospital order, performed in [facility] hospital lab) Nasopharyngeal Nasopharyngeal Swab     Status: None   Collection Time: 01/13/20  6:44 AM   Specimen: Nasopharyngeal Swab  Result Value Ref Range Status   SARS Coronavirus 2 NEGATIVE NEGATIVE Final     Comment: (NOTE) SARS-CoV-2 target nucleic acids are NOT DETECTED.  The SARS-CoV-2 RNA is generally detectable in upper and lower respiratory specimens during the acute phase of infection. The lowest concentration of SARS-CoV-2 viral copies this assay can detect is 250 copies / mL. A negative result does not preclude SARS-CoV-2 infection and should not be used as the sole basis for treatment or other patient management decisions.  A negative result may occur with improper specimen collection / handling, submission of specimen other than nasopharyngeal swab, presence of viral mutation(s) within  the areas targeted by this assay, and inadequate number of viral copies (<250 copies / mL). A negative result must be combined with clinical observations, patient history, and epidemiological information.  Fact Sheet for Patients:   StrictlyIdeas.no  Fact Sheet for Healthcare Providers: BankingDealers.co.za  This test is not yet approved or  cleared by the [place] FDA and has been authorized for detection and/or diagnosis of SARS-CoV-2 by FDA under an Emergency Use Authorization (EUA).  This EUA will remain in effect (meaning this test can be used) for the duration of the COVID-19 declaration under Section 564(b)(1) of the Act, 21 U.S.C. section 360bbb-3(b)(1), unless the authorization is terminated or revoked sooner.  Performed at [facility], [place] [address] [place], [place] [address]      Scheduled Meds: . acetaminophen  650 mg Oral QHS  . apixaban  5 mg Oral BID  . atorvastatin  40 mg Oral QHS  . escitalopram  5 mg Oral Daily  . folic acid  2 mg Oral Daily  . guaiFENesin  600 mg Oral BID  . insulin aspart  6 Units Subcutaneous TID with meals  . insulin detemir  14 Units Subcutaneous q morning - 10a  . [START ON 01/15/2020] methotrexate  12.5 mg Oral Q Wed  . pantoprazole  40 mg Oral QPC lunch  . predniSONE  4 mg Oral Q  breakfast  . sodium chloride flush  3 mL Intravenous Q12H   Continuous Infusions: . azithromycin Stopped (01/14/20 0654)  . cefTRIAXone (ROCEPHIN)  IV Stopped (01/14/20 1103)  . ferumoxytol 510 mg (01/14/20 1055)     LOS: 1 day   Cherene Altes, MD [facility] Office  [phone] Pager - Text Page per Amion  If 7PM-7AM, please contact night-coverage per Amion 01/14/2020, 11:01 AM

## 2020-01-14 NOTE — ED Notes (Signed)
Ordered breakfast 

## 2020-01-15 DIAGNOSIS — L8990 Pressure ulcer of unspecified site, unspecified stage: Secondary | ICD-10-CM | POA: Insufficient documentation

## 2020-01-15 DIAGNOSIS — I4891 Unspecified atrial fibrillation: Secondary | ICD-10-CM

## 2020-01-15 DIAGNOSIS — I350 Nonrheumatic aortic (valve) stenosis: Secondary | ICD-10-CM

## 2020-01-15 LAB — BASIC METABOLIC PANEL
Anion gap: 10 (ref 5–15)
BUN: 21 mg/dL (ref 8–23)
CO2: 22 mmol/L (ref 22–32)
Calcium: 8.4 mg/dL — ABNORMAL LOW (ref 8.9–10.3)
Chloride: 105 mmol/L (ref 98–111)
Creatinine, Ser: 0.99 mg/dL (ref 0.44–1.00)
GFR calc Af Amer: >60 mL/min (ref >60)
GFR calc non Af Amer: 52 mL/min — ABNORMAL LOW (ref >60)
Glucose, Bld: 207 mg/dL — ABNORMAL HIGH (ref 70–99)
Potassium: 3.7 mmol/L (ref 3.5–5.1)
Sodium: 137 mmol/L (ref 135–145)

## 2020-01-15 LAB — URINE CULTURE: Culture: >=100000 — AB

## 2020-01-15 LAB — CBC
HCT: 27.7 % — ABNORMAL LOW (ref 36.0–46.0)
Hemoglobin: 8 g/dL — ABNORMAL LOW (ref 12.0–15.0)
MCH: 24.8 pg — ABNORMAL LOW (ref 26.0–34.0)
MCHC: 28.9 g/dL — ABNORMAL LOW (ref 30.0–36.0)
MCV: 86 fL (ref 80.0–100.0)
Platelets: 408 10*3/uL — ABNORMAL HIGH (ref 150–400)
RBC: 3.22 MIL/uL — ABNORMAL LOW (ref 3.87–5.11)
RDW: 22.2 % — ABNORMAL HIGH (ref 11.5–15.5)
WBC: 20.3 10*3/uL — ABNORMAL HIGH (ref 4.0–10.5)
nRBC: 0.3 % — ABNORMAL HIGH (ref 0.0–0.2)

## 2020-01-15 LAB — TSH: TSH: 1.118 u[IU]/mL (ref 0.350–4.500)

## 2020-01-15 LAB — GLUCOSE, CAPILLARY
Glucose-Capillary: 140 mg/dL — ABNORMAL HIGH (ref 70–99)
Glucose-Capillary: 136 mg/dL — ABNORMAL HIGH (ref 70–99)
Glucose-Capillary: 142 mg/dL — ABNORMAL HIGH (ref 70–99)
Glucose-Capillary: 95 mg/dL (ref 70–99)
Glucose-Capillary: 126 mg/dL — ABNORMAL HIGH (ref 70–99)

## 2020-01-15 LAB — MAGNESIUM: Magnesium: 1.8 mg/dL (ref 1.7–2.4)

## 2020-01-15 MED ORDER — ZOLPIDEM TARTRATE 5 MG PO TABS
5.0000 mg | ORAL_TABLET | Freq: Every evening | ORAL | Status: DC | PRN
  Administered 2020-01-17 – 2020-01-19 (×2): 5 mg via ORAL
  Filled 2020-01-15 (×3): qty 1

## 2020-01-15 MED ORDER — SODIUM CHLORIDE 0.9% FLUSH: 10.0000 mL | INTRAVENOUS | Status: DC | PRN

## 2020-01-15 MED ORDER — SODIUM CHLORIDE 0.9 % IV SOLN
INTRAVENOUS | Status: DC | PRN
  Administered 2020-01-15 – 2020-01-19 (×3): 250 mL via INTRAVENOUS

## 2020-01-15 MED ORDER — METOPROLOL SUCCINATE ER 100 MG PO TB24
100.0000 mg | ORAL_TABLET | Freq: Every morning | ORAL | Status: DC
  Administered 2020-01-15 – 2020-01-20 (×6): 100 mg via ORAL
  Filled 2020-01-15 (×6): qty 1

## 2020-01-15 MED ORDER — METOPROLOL TARTRATE 50 MG PO TABS
50.0000 mg | ORAL_TABLET | Freq: Once | ORAL | Status: AC
  Administered 2020-01-15: 50 mg via ORAL
  Filled 2020-01-15: qty 1

## 2020-01-15 MED ORDER — CHLORHEXIDINE GLUCONATE CLOTH 2 % EX PADS
6.0000 | MEDICATED_PAD | Freq: Every day | CUTANEOUS | Status: DC
  Administered 2020-01-17 – 2020-01-20 (×3): 6 via TOPICAL

## 2020-01-15 NOTE — Progress Notes (Signed)
Pt HR went into the 140's when she stood up to be weighed on the scale. EKG done. MD paged. See new orders. HR 110-130's at this time. Will continue to monitor.

## 2020-01-15 NOTE — Progress Notes (Signed)
   01/15/20 0800  Assess: MEWS Score  Temp 98.4 F (36.9 C)  BP 103/62  Pulse Rate (!) 106 (afib)  ECG Heart Rate (!) 109  Resp 17  Level of Consciousness Alert  SpO2 97 %  O2 Device HFNC  O2 Flow Rate (L/min) 15 L/min  Assess: MEWS Score  MEWS Temp 0  MEWS Systolic 0  MEWS Pulse 1  MEWS RR 0  MEWS LOC 0  MEWS Score 1  MEWS Score Color Green  Assess: if the MEWS score is Yellow or Red  Were vital signs taken at a resting state? Yes  Focused Assessment No change from prior assessment  Early Detection of Sepsis Score *See Row Information* High  MEWS guidelines implemented *See Row Information* No, previously yellow, continue vital signs every 4 hours  Treat  MEWS Interventions Escalated (See documentation below)  Pain Scale 0-10  Pain Score 0  Take Vital Signs  Increase Vital Sign Frequency  Yellow: Q 2hr X 2 then Q 4hr X 2, if remains yellow, continue Q 4hrs  Escalate  MEWS: Escalate Yellow: discuss with charge nurse/RN and consider discussing with provider and RRT  Notify: Charge Nurse/RN  Name of Charge Nurse/RN Notified Monette Rn  Date Charge Nurse/RN Notified 01/15/20  Time Charge Nurse/RN Notified 0730  Notify: Provider  Provider Name/Title n/a  Document  Patient Outcome Stabilized after interventions  Progress note created (see row info) Yes  Vital rechecked at resting state

## 2020-01-15 NOTE — Progress Notes (Addendum)
PROGRESS NOTE  Sophia Ward QZR:[id number] DOB: Jul 27, 1932 DOA: 01/13/2020 PCP: [name], MD   LOS: 2 days   Brief narrative: As per HPI,  86 year old with a history of hypertension, hyperlipidemia, paroxysmal atrial fibrillation, coronary artery disease  status post stents, aortic stenosis, diabetes mellitus type 2, rheumatoid arthritis on methotrexate, PMR on chronic prednisone, and spinal stenosis  presented to the hospital with 3 days of progressively worsening shortness of breath, nausea, vomiting, and diarrhea.  She admits to having chronic diarrhea but noted increased frequency.  She noted that she has been fully vaccinated but had a recent Covid exposure.  EMS was called in and the patient's initial room air saturation was 68%.  In the ED, chest x-ray revealed patchy pulmonary opacities suggestive of atypical pneumonia.  She was then admitted to hospital for further evaluation and treatment.  Assessment/Plan:  Principal Problem:   Sepsis due to pneumonia [facility]) Active Problems:   Obesity, unspecified   Rheumatoid arthritis involving multiple sites with positive rheumatoid factor (HCC)   Elevated sed rate   Polymyalgia rheumatica (HCC)   Iron deficiency anemia   Transient hypotension   Acute respiratory failure with hypoxia (HCC)   Pressure injury of skin  Acute hypoxic respiratory failure likely secondary to pneumonia with sepsis.  Present on admission.   Chest x-ray showed patchy pulmonary opacity suggestive of atypical pneumonia.  Question methotrexate induced lung damage.   Patient had lactic acidosis with lactate of 3.6 which has normalized 1.5 at this time.  Continue IV Rocephin and azithromycin.  Respiratory viral panel negative COVID-19 including covid 19..  Legionella antigen negative.  Blood cultures have been negative to date.  Temperature max of 98.6.  Fahrenheit.  Currently on 15 L/min of high flow nasal cannula.  Try to wean as possible.    Significant  leukocytosis secondary to pneumonia UTI and use of steroids.  We will continue to monitor.  Acute on chronic paroxysmal atrial fibrillation on chronic anticoagulation On Eliquis.  Patient had overnight.  Patient has been given 1 dose of p.o. metoprolol.  Will restart her metoprolol succinate long-acting from home.  Patient wishes to see cardiology at this time.  Mild RVR.  E. coli UTI.  On Rocephin IV.  Significantly elevated WBC at 22.6.  Will trend CBC.  Urine culture showed more than 100,000 E. Coli.  Hypotension Blood pressures 94/46 at time of admission -improved with volume resuscitation.  Latest blood pressure 103/ 62.  Will resume long-acting metoprolol.  Continue to hold ACE inhibitor.  Closely monitor blood pressure.  Patient used to see Dr. Irish Lack cardiology as outpatient.  Acute kidney injury Mild on presentation.  Creatinine has improved at this time after volume replacement.  Creatinine of 1.0 at this time.  Continue to hold ramipril.  Monitor BMP closely.  Hypokalemia Corrected with supplementation.  Latest potassium of 4.7.  Diabetes mellitus type 2. Levemir at home.  Will closely monitor blood glucose levels.  Continue sliding scale insulin, prandial insulin and Levemir at bedtime.  Accu-Cheks diabetic diet.  Latest POC glucose of 136.  Hemoglobin A1c on 01/13/2020 was 5.7.  Chronic iron deficiency anemia Received IV iron.  Will likely need iron supplements on discharge.   Aortic stenosis Compensated at this time.  Patient strongly wishes to see cardiology at this time.  Chronic diastolic CHF Continue Lipitor, Eliquis. Borderline low blood pressure.  Not on diuretics at home.  Will get cardiology evaluation.  Patient is currently hypoxic requiring high flow oxygen.  Rheumatoid arthritis /polymyalgia rheumatica On chronic immunosuppressive therapy to include methotrexate and steroids.  Patient on low-dose prednisone at baseline.  Continue for  now..  Hyperlipidemia.  On Lipitor at home.  GERD.  Continue PPI   Obesity - Body mass index is 31.28 kg/m.  Stage I buttocks pressure ulceration.  Present on admission.  We will continue preventive protocol. Pressure Injury 01/14/20 Buttocks Stage 1 -  Intact skin with non-blanchable redness of a localized area usually over a bony prominence. (Active)  01/14/20 2350  Location: Buttocks  Location Orientation:   Staging: Stage 1 -  Intact skin with non-blanchable redness of a localized area usually over a bony prominence.  Wound Description (Comments):   Present on Admission: Yes    DVT prophylaxis:  apixaban (ELIQUIS) tablet 5 mg    Code Status: Full code  Family Communication: None.  Status is: Inpatient  Remains inpatient appropriate because:IV treatments appropriate due to intensity of illness or inability to take PO and Inpatient level of care appropriate due to severity of illness, new atrial fibrillation with RVR, high oxygen requirement, acute hypoxic respiratory failure, cardiology evaluation   Dispo: The patient is from: Home              Anticipated d/c is to: Home              Anticipated d/c date is: 2 days              Patient currently is not medically stable to d/c.  Consultants:  Informed cardiology of consult  Procedures:  None  Antibiotics:  . Rocephin IV 8/16> . Zithromax iv 8/16>  Anti-infectives (From admission, onward)   Start     Dose/Rate Route Frequency Ordered Stop   01/13/20 0645  cefTRIAXone (ROCEPHIN) 1 g in sodium chloride 0.9 % 100 mL IVPB     Discontinue     1 g 200 mL/hr over 30 Minutes Intravenous Every 24 hours 01/13/20 0633     01/13/20 0645  azithromycin (ZITHROMAX) 500 mg in sodium chloride 0.9 % 250 mL IVPB     Discontinue     500 mg 250 mL/hr over 60 Minutes Intravenous Every 24 hours 01/13/20 0633     01/13/20 0630  levofloxacin (LEVAQUIN) IVPB 750 mg  Status:  Discontinued        750 mg 100 mL/hr over 90 Minutes  Intravenous  Once 01/13/20 0626 01/13/20 1245     Subjective: Today, patient was seen and examined at bedside.  Patient states she does not feel well.  She denies any chest pain, palpitation but feels dyspneic and is on high flow nasal cannula oxygen.  Wishes cardiology consultation.  Objective: Vitals:   01/15/20 0740 01/15/20 0800  BP:  103/62  Pulse:  (!) 106  Resp:  17  Temp:  98.4 F (36.9 C)  SpO2: 94% 97%    Intake/Output Summary (Last 24 hours) at 01/15/2020 0806 Last data filed at 01/14/2020 2300 Gross per 24 hour  Intake 598.02 ml  Output 350 ml  Net 248.02 ml   Filed Weights   01/13/20 0521 01/14/20 1622 01/15/20 0044  Weight: 77.6 kg 79.3 kg 78.3 kg   Body mass index is 31.59 kg/m.   Physical Exam:  GENERAL: Patient is alert awake and oriented. Not in obvious distress.  Obese, on high flow nasal cannula oxygen HENT: No scleral pallor or icterus. Pupils equally reactive to light. Oral mucosa is moist NECK: is supple, no gross swelling noted.  CHEST:  Diminished breath sounds bilaterally.  Coarse breath sounds noted bilaterally. CVS: S1 and S2 heard, systolic murmur, irregular rhythm, tachycardia ABDOMEN: Soft, non-tender, bowel sounds are present. EXTREMITIES: Trace lower extremity edema CNS: Cranial nerves are intact. No focal motor deficits. SKIN: warm and dry without rashes.  Data Review: I have personally reviewed the following laboratory data and studies,  CBC: Recent Labs  Lab 01/13/20 0523 01/14/20 0403  WBC 18.1* 22.6*  NEUTROABS 16.5*  --   HGB 9.1* 7.8*  HCT 31.8* 27.3*  MCV 87.4 87.5  PLT 435* 947   Basic Metabolic Panel: Recent Labs  Lab 01/13/20 0523 01/14/20 0403  NA 139 138  K 3.2* 4.7  CL 105 104  CO2 16* 23  GLUCOSE 226* 232*  BUN 18 20  CREATININE 1.36* 1.06*  CALCIUM 8.8* 8.5*   Liver Function Tests: Recent Labs  Lab 01/13/20 0523  AST 27  ALT 15  ALKPHOS 66  BILITOT 0.9  PROT 6.6  ALBUMIN 3.1*   No results  for input(s): LIPASE, AMYLASE in the last 168 hours. No results for input(s): AMMONIA in the last 168 hours. Cardiac Enzymes: No results for input(s): CKTOTAL, CKMB, CKMBINDEX, TROPONINI in the last 168 hours. BNP (last 3 results) Recent Labs    01/13/20 0523  BNP 277.1*    ProBNP (last 3 results) No results for input(s): PROBNP in the last 8760 hours.  CBG: Recent Labs  Lab 01/14/20 1221 01/14/20 1748 01/14/20 2143 01/15/20 0628 01/15/20 0728  GLUCAP 256* 194* 204* 140* 136*   Recent Results (from the past 240 hour(s))  Urine culture     Status: Abnormal (Preliminary result)   Collection Time: 01/13/20  5:26 AM   Specimen: In/Out Cath Urine  Result Value Ref Range Status   Specimen Description IN/OUT CATH URINE  Final   Special Requests   Final    NONE Performed at [facility], [place] [address] [place], [place] [address]    Culture >=100,000 COLONIES/mL ESCHERICHIA COLI (A)  Final   Report Status PENDING  Incomplete  Blood Culture (routine x 2)     Status: None (Preliminary result)   Collection Time: 01/13/20  5:26 AM   Specimen: BLOOD RIGHT ARM  Result Value Ref Range Status   Specimen Description BLOOD RIGHT ARM  Final   Special Requests   Final    BOTTLES DRAWN AEROBIC AND ANAEROBIC BLOOD RIGHT ARM   Culture   Final    NO GROWTH 2 DAYS Performed at [facility], [place] [address], [place], [place] [address]    Report Status PENDING  Incomplete  Blood Culture (routine x 2)     Status: None (Preliminary result)   Collection Time: 01/13/20  5:31 AM   Specimen: BLOOD LEFT ARM  Result Value Ref Range Status   Specimen Description BLOOD LEFT ARM  Final   Special Requests   Final    BOTTLES DRAWN AEROBIC AND ANAEROBIC Blood Culture results may not be optimal due to an inadequate volume of blood received in culture bottles   Culture   Final    NO GROWTH 2 DAYS Performed at [facility], [place] [address], [place], [place] [address]    Report Status  PENDING  Incomplete  SARS Coronavirus 2 by RT PCR (hospital order, performed in [facility] hospital lab) Nasopharyngeal Nasopharyngeal Swab     Status: None   Collection Time: 01/13/20  6:44 AM   Specimen: Nasopharyngeal Swab  Result Value Ref Range Status   SARS Coronavirus 2 NEGATIVE NEGATIVE Final    Comment: (NOTE) SARS-CoV-2 target nucleic acids are NOT DETECTED.  The SARS-CoV-2 RNA is generally detectable in upper and lower respiratory specimens during the acute phase of infection. The lowest concentration of SARS-CoV-2 viral copies this assay can detect is 250 copies / mL. A negative result does not preclude SARS-CoV-2 infection and should not be used as the sole basis for treatment or other patient management decisions.  A negative result may occur with improper specimen collection / handling, submission of specimen other than nasopharyngeal swab, presence of viral mutation(s) within the areas targeted by this assay, and inadequate number of viral copies (<250 copies / mL). A negative result must be combined with clinical observations, patient history, and epidemiological information.  Fact Sheet for Patients:   StrictlyIdeas.no  Fact Sheet for Healthcare Providers: BankingDealers.co.za  This test is not yet approved or  cleared by the [place] FDA and has been authorized for detection and/or diagnosis of SARS-CoV-2 by FDA under an Emergency Use Authorization (EUA).  This EUA will remain in effect (meaning this test can be used) for the duration of the COVID-19 declaration under Section 564(b)(1) of the Act, 21 U.S.C. section 360bbb-3(b)(1), unless the authorization is terminated or revoked sooner.  Performed at [facility], [place] [address] [place], [place] [address]   Respiratory Panel by PCR     Status: None   Collection Time: 01/14/20  5:54 PM   Specimen: Nasopharyngeal Swab; Respiratory  Result Value Ref  Range Status   Adenovirus NOT DETECTED NOT DETECTED Final   Coronavirus 229E NOT DETECTED NOT DETECTED Final    Comment: (NOTE) The Coronavirus on the Respiratory Panel, DOES NOT test for the novel  Coronavirus (2019 nCoV)    Coronavirus HKU1 NOT DETECTED NOT DETECTED Final   Coronavirus NL63 NOT DETECTED NOT DETECTED Final   Coronavirus OC43 NOT DETECTED NOT DETECTED Final   Metapneumovirus NOT DETECTED NOT DETECTED Final   Rhinovirus / Enterovirus NOT DETECTED NOT DETECTED Final   Influenza A NOT DETECTED NOT DETECTED Final   Influenza B NOT DETECTED NOT DETECTED Final   Parainfluenza Virus 1 NOT DETECTED NOT DETECTED Final   Parainfluenza Virus 2 NOT DETECTED NOT DETECTED Final   Parainfluenza Virus 3 NOT DETECTED NOT DETECTED Final   Parainfluenza Virus 4 NOT DETECTED NOT DETECTED Final   Respiratory Syncytial Virus NOT DETECTED NOT DETECTED Final   Bordetella pertussis NOT DETECTED NOT DETECTED Final   Chlamydophila pneumoniae NOT DETECTED NOT DETECTED Final   Mycoplasma pneumoniae NOT DETECTED NOT DETECTED Final    Comment: Performed at [facility], [place]. [address], [place], [place] [address]     Studies: No results found.    Flora Lipps, MD  [facility] 01/15/2020

## 2020-01-15 NOTE — Progress Notes (Signed)
   01/15/20 0730  Vitals  Temp 98.6 F (37 C)  Temp Source Oral  BP 100/88  MAP (mmHg) 94  BP Location Left Arm  BP Method Automatic  Patient Position (if appropriate) Lying  Pulse Rate (!) 109  Pulse Rate Source Monitor  ECG Heart Rate (!) 109  Resp 17  Level of Consciousness  Level of Consciousness Alert  MEWS COLOR  MEWS Score Color Yellow  Oxygen Therapy  SpO2 94 %  O2 Device Nasal Cannula  Pain Assessment  Pain Scale 0-10  Pain Score 0  Complaints & Interventions  Complains of Other (Comment)  Interventions Relaxation;Reposition  Patients response to intervention Decreased  PCA/Epidural/Spinal Assessment  Respiratory Pattern Dyspnea with exertion  Glasgow Coma Scale  Eye Opening 4  Best Verbal Response (NON-intubated) 5  Best Motor Response 6  Glasgow Coma Scale Score 15  MEWS Score  MEWS Temp 0  MEWS Systolic 1  MEWS Pulse 1  MEWS RR 0  MEWS LOC 0  MEWS Score 2  Provider Notification  Provider Name/Title n/a  Rapid Response Notification  Name of Rapid Response RN Notified n/a  Note  Observations Alert   Patient is currently eating with no distress. Baseline HR 110s going up with movement and artifact.

## 2020-01-15 NOTE — Significant Event (Addendum)
HOSPITAL MEDICINE OVERNIGHT EVENT NOTE    Notified by nursing patient suddenly has gone into rapid atrial fibrillation, heart rate between 110's to 120's.    Chart reviewed.  Patient has a history of atrial fibrillation and is supposed to be on Metoprolol Succinate 100mg  Q24hrs.  This was not continued on admission.   Will administer a one time dose of Metoprolol Tartrate 50mg  x 1 PO now and monitor.  Day provider to evaluate possible need to restart Metoprolol Succinate regimen in the morning.  Vernelle Emerald  MD [facility]

## 2020-01-15 NOTE — Consult Note (Addendum)
Cardiology Consultation:   Patient ID: Sophia Ward MRN: [id number]; DOB: 05-28-1933  Admit date: 01/13/2020 Date of Consult: 01/15/2020  Primary Care Provider: [name], MD [facility] Cardiologist: Larae Grooms, MD  [facility] Electrophysiologist:  None    Patient Profile:   Sophia Ward is a 86 y.o. female with a history of CAD with prior stenting to LAD, severe aortic stenosis, paroxysmal atrial fibrillation on Eliquis, hypertension, hyperlipidemia, diabetes mellitus, chronic anemia, spinal stenosis with chronic back pain, rheumatoid arthritis on Methotrexate, polymalgia rheumatica on chronic Prednisone, GERD, and vertigo who is being seen today for the evaluation of atrial fibrillation with RVR at the request of Dr. Louanne Belton.  History of Present Illness:   [name] is a 86 year old female with the above history who is followed by Dr. Irish Lack. She was admitted in 09/2019 for symptomatic anemia with drop in hemoglobin from baseline of 12-13 to around 7. She was transfused 1 unit of PRBCs. Plavix was stoopped. EGD showed no bleeding lesions and colonoscopy showed a few ulcers in terminal ileum but no active bleeding. Shew as also noted to be in new onset atrial fibrillation during this admission. Echo showed LVEF of 60-65% with normal wall motion and severe aortic stenosis (progressed from moderate aortic stenosis compared to piror Echo in 01/2018). She was restarted on Metoprolol and converted back to sinus rhythm. She was also started on Eliquis once cleared by GI. Cardiology was not formally consulted during this admission. Patient was seen by Dr. Irish Lack for follow-up in 6/2021at which time she did note some exertional chest tightness but stated it felt different than what she had prior to PCI. Plan was for consideration of cardiac cath as part of AVR work-up. However, she has not had cardiac cath yet.  Patient presented to the ED on 01/13/2020 via EMS for  further evaluation of fever, vomiting, and loose stools. Upon EMS arrival, she was noted to be hypoxic with O2 sats of 68% on room air. She was placed on CPAP with improvement of O2 sats to the low 90's. Upon arrival to the ED, she was afebrile, borderline hypotensive, and mildly tachypneic. She was found to have leukocytosis of 18.1. Chest x-ray showed patchy pulmonary opacity which was favored to be atypical pneumonia over CHF. BNP mildly elevated at 277. Lactic acid elevated 3.6. Sepsis protocol was initiated and patient was treated with IV fluids and antibiotics. She was admitted with sepsis secondary to pneumonia with hypoxic respiratory failure.  Overnight, she was noted to go into atrial fibrillation with rates in the 110's to 120's. She was given given one dose of Lopressor 50mg  daily. Cardiology being consulted for further evaluation per patient's request.  At the time of this evaluation, patient resting comfortably in no acute distress. She is still on high flow nasal cannula at 13L/min. Patient reports intermittent chills for the last week leading up to presentation which she thought was probably just due to a recurrent UTI as she had noted increased urinary frequency. No dysuria. However, then on day of presentation, she had a few and vomited a couple of times which is why EMS was called. She denies any significant shortness of breath though. She notes chronic shortness of breath especially when her hemoglobin as dropped but states it has been pretty stable recently. She does describe a little worsening orthopnea but no PND. No edema. No chest pain or abdominal pain. No palpitations, lightheadedness, dizziness, or or near syncope/syncope. She has chronic anemia. Stools  are dark due to iron but she denies any hematochezia, hematuria, or hematemesis. She reports feeling a little more weak and fatigue lately.  She is feeling better since being admitted. She feels like she is breathing a little better  but still requiring high flow O2. Chills have resolved. She has been afebrile here. No recurrent vomiting.   Of note, patient states she was scheduled to see Dr. Irish Lack tomorrow to discuss further work-up of aortic stenosis.  Past Medical History:  Diagnosis Date  . Allergic rhinitis   . Anemia 09/2019  . Arthritis    hands  . BPPV (benign paroxysmal positional vertigo)   . Coronary artery disease   . Coronary atherosclerosis of native coronary artery 01/30/2014  . Diabetes mellitus without complication ([place])   . Essential hypertension, benign 01/30/2014  . GERD (gastroesophageal reflux disease)   . Hypertension   . Mixed hyperlipidemia 01/30/2014  . Myocardial infarction ([place]) 2004   mild MI-no damage- had stents  . Osteopenia   . Post-menopausal   . Rheumatoid arthritis ([place])   . Spinal stenosis   . Stenosing tenosynovitis     Past Surgical History:  Procedure Laterality Date  . ABDOMINAL HYSTERECTOMY    . APPENDECTOMY    . BIOPSY  10/25/2019   Procedure: BIOPSY;  Surgeon: Otis Brace, MD;  Location: [place];  Service: Gastroenterology;;  . BIOPSY  10/26/2019   Procedure: BIOPSY;  Surgeon: Otis Brace, MD;  Location: [place] [facility];  Service: Gastroenterology;;  . CARDIAC CATHETERIZATION  2004  . carpel tunnel    . COLONOSCOPY WITH PROPOFOL N/A 10/30/2012   Procedure: COLONOSCOPY WITH PROPOFOL;  Surgeon: Garlan Fair, MD;  Location: [facility];  Service: Endoscopy;  Laterality: N/A;  . COLONOSCOPY WITH PROPOFOL N/A 10/26/2019   Procedure: COLONOSCOPY WITH PROPOFOL;  Surgeon: Otis Brace, MD;  Location: [place];  Service: Gastroenterology;  Laterality: N/A;  . CORONARY ANGIOPLASTY  2004  . DILATION AND CURETTAGE OF UTERUS    . ESOPHAGOGASTRODUODENOSCOPY N/A 10/25/2019   Procedure: ESOPHAGOGASTRODUODENOSCOPY (EGD);  Surgeon: Otis Brace, MD;  Location[facility] END[facility]COPY;  Service: Gastroenterology;  Laterality: N/A;  . ESOPHAGOGASTRODUODENOSCOPY  (EGD) WITH PROPOFOL N/A 10/30/2012   Procedure: ESOPHAGOGASTRODUODENOSCOPY (EGD) WITH PROPOFOL;  Surgeon: Garlan Fair, MD;  Location: [facility];  Service: Endoscopy;  Laterality: N/A;  . EYE SURGERY     bilateral cataract with lens implant  . EYE SURGERY    . INJECTION OF SILICONE OIL Left 10/29/930   Procedure: Injection Of Silicone Oil;  Surgeon: Jalene Mullet, MD;  Location: [place];  Service: Ophthalmology;  Laterality: Left;  . left shouler surgery    . PHOTOCOAGULATION WITH LASER Left 02/12/2019   Procedure: Photocoagulation With Laser;  Surgeon: Jalene Mullet, MD;  Location: [place];  Service: Ophthalmology;  Laterality: Left;  . 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 02/12/2019   Procedure: REPAIR OF COMPLEX TRACTION RETINAL DETACHMENT;  Surgeon: Jalene Mullet, MD;  Location: [place];  Service: Ophthalmology;  Laterality: Left;  . TONSILLECTOMY    . VITRECTOMY 25 GAUGE WITH SCLERAL BUCKLE Left 02/12/2019   Procedure: Vitrectomy 25 Gauge With Scleral Buckle;  Surgeon: Jalene Mullet, MD;  Location: [place];  Service: Ophthalmology;  Laterality: Left;     Home Medications:  Prior to Admission medications   Medication Sig Start Date End Date Taking? Authorizing Provider  acetaminophen (TYLENOL) 650 MG CR tablet Take 1,300 mg by mouth at bedtime.   Yes [provider]  amLODipine (NORVASC) 10 MG  tablet Take 1 tablet (10 mg total) by mouth every morning. 11/11/19  Yes Jettie Booze, MD  apixaban (ELIQUIS) 5 MG TABS tablet Take 1 tablet (5 mg total) by mouth 2 (two) times daily. 11/11/19  Yes Jettie Booze, MD  atorvastatin (LIPITOR) 40 MG tablet Take 1 tablet (40 mg total) by mouth at bedtime. 04/30/19  Yes Jettie Booze, MD  Calcium Carbonate-Vitamin D (CALCIUM + D PO) Take 1 tablet by mouth every morning.   Yes [provider]  Cholecalciferol (VITAMIN D3) 125 MCG (5000 UT) CAPS Take 5,000 Units by mouth daily.    Yes [provider]  escitalopram (LEXAPRO) 5 MG tablet Take 5 mg by mouth daily. 12/29/19  Yes [provider]  estradiol (ESTRACE) 0.1 MG/GM vaginal cream Place 1 Applicatorful vaginally daily as needed (driness).  08/21/19  Yes [provider]  folic acid (FOLVITE) 1 MG tablet Take 2 tablets (2 mg total) by mouth daily. 09/30/19  Yes Ofilia Neas, PA-C  insulin lispro (HUMALOG KWIKPEN) 100 UNIT/ML KiwkPen Inject 6 Units into the skin 3 (three) times daily.    Yes [provider]  LEVEMIR FLEXTOUCH 100 UNIT/ML Pen Inject 14 Units into the skin every morning.  08/25/16  Yes [provider]  methotrexate 2.5 MG tablet TAKE 5 TABLETS BY MOUTH ONCE A WEEK. Caution:Chemotherapy. Protect from light. Patient taking differently: Take 12.5 mg by mouth every Wednesday.  09/30/19  Yes Ofilia Neas, PA-C  metoprolol succinate (TOPROL-XL) 100 MG 24 hr tablet Take 1 tablet (100 mg total) by mouth every morning. 11/28/19  Yes Jettie Booze, MD  nitroGLYCERIN (NITROSTAT) 0.4 MG SL tablet DISSOLVE ONE TABLET UNDER THE TONGUE EVERY 5 MINUTES AS NEEDED FOR CHEST PAIN.  DO NOT EXCEED A TOTAL OF 3 DOSES IN 15 MINUTES 11/11/19  Yes Jettie Booze, MD  Omega-3 Fatty Acids (FISH OIL PO) Take 1 capsule by mouth every morning.   Yes [provider]  pantoprazole (PROTONIX) 40 MG tablet Take 1 tablet (40 mg total) by mouth daily after lunch. 10/27/19  Yes Bonnielee Haff, MD  predniSONE (DELTASONE) 1 MG tablet Take 4 tablets (4 mg total) by mouth daily with breakfast. 11/05/19  Yes Deveshwar, Abel Presto, MD  ramipril (ALTACE) 10 MG tablet Take 10 mg by mouth 2 (two) times daily.    Yes [provider]  B-D ULTRAFINE III SHORT PEN 31G X 8 MM MISC 1 each by Other route in the morning, at noon, in the evening, and at bedtime.  07/29/19   [provider]  benzonatate (TESSALON) 100 MG capsule Take 1 capsule (100 mg total) by mouth 3 (three) times daily. Patient not taking:  Reported on 01/13/2020 10/27/19   Bonnielee Haff, MD    Inpatient Medications: Scheduled Meds: . acetaminophen  650 mg Oral QHS  . apixaban  5 mg Oral BID  . atorvastatin  40 mg Oral QHS  . escitalopram  5 mg Oral Daily  . folic acid  2 mg Oral Daily  . guaiFENesin  600 mg Oral BID  . insulin aspart  0-15 Units Subcutaneous TID WC  . insulin aspart  0-5 Units Subcutaneous QHS  . insulin aspart  6 Units Subcutaneous TID with meals  . insulin detemir  14 Units Subcutaneous q morning - 10a  . methotrexate  12.5 mg Oral Q Wed  . metoprolol succinate  100 mg Oral q morning - 10a  . pantoprazole  40 mg Oral QPC  lunch  . predniSONE  4 mg Oral Q breakfast  . sodium chloride flush  3 mL Intravenous Q12H   Continuous Infusions: . sodium chloride 250 mL (01/15/20 0653)  . azithromycin 500 mg (01/15/20 0809)  . cefTRIAXone (ROCEPHIN)  IV 1 g (01/15/20 0654)   PRN Meds: sodium chloride, acetaminophen **OR** [DISCONTINUED] acetaminophen, estradiol, ipratropium-albuterol, ondansetron **OR** ondansetron (ZOFRAN) IV  Allergies:    Allergies  Allergen Reactions  . Codeine Nausea And Vomiting    MAKES HER SICK  . Glimepiride Other (See Comments)    Can't remember  . Jardiance [Empagliflozin] Other (See Comments)    Yeast  . Metformin And Related Diarrhea  . Penicillins Rash    Social History:   Social History   Socioeconomic History  . Marital status: Married    Spouse name: Not on file  . Number of children: Not on file  . Years of education: Not on file  . Highest education level: Not on file  Occupational History  . Not on file  Tobacco Use  . Smoking status: Never Smoker  . Smokeless tobacco: Never Used  Vaping Use  . Vaping Use: Never used  Substance and Sexual Activity  . Alcohol use: No  . Drug use: No  . Sexual activity: Not on file  Other Topics Concern  . Not on file  Social History Narrative  . Not on file   Social Determinants of Health   Financial  Resource Strain:   . Difficulty of Paying Living Expenses:   Food Insecurity:   . Worried About Charity fundraiser in the Last Year:   . Arboriculturist in the Last Year:   Transportation Needs:   . Film/video editor (Medical):   Marland Kitchen Lack of Transportation (Non-Medical):   Physical Activity:   . Days of Exercise per Week:   . Minutes of Exercise per Session:   Stress:   . Feeling of Stress :   Social Connections:   . Frequency of Communication with Friends and Family:   . Frequency of Social Gatherings with Friends and Family:   . Attends Religious Services:   . Active Member of Clubs or Organizations:   . Attends Archivist Meetings:   Marland Kitchen Marital Status:   Intimate Partner Violence:   . Fear of Current or Ex-Partner:   . Emotionally Abused:   Marland Kitchen Physically Abused:   . Sexually Abused:     Family History:    Family History  Problem Relation Age of Onset  . CAD Mother   . Heart attack Mother   . CAD Father   . Heart Problems Brother        open heart surgery   . Arthritis Daughter        psoriatic arthritis      ROS:  Please see the history of present illness.  All other ROS reviewed and negative.     Physical Exam/Data:   Vitals:   01/15/20 0730 01/15/20 0740 01/15/20 0800 01/15/20 1148  BP: 100/88  103/62 111/71  Pulse: (!) 109  (!) 106 97  Resp: 17  17 (!) 24  Temp: 98.6 F (37 C)  98.4 F (36.9 C) 99 F (37.2 C)  TempSrc: Oral   Oral  SpO2: 94% 94% 97% 91%  Weight:      Height:        Intake/Output Summary (Last 24 hours) at 01/15/2020 1404 Last data filed at 01/15/2020 1000 Gross per 24 hour  Intake 1616.23 ml  Output 750 ml  Net 866.23 ml   Last 3 Weights 01/15/2020 01/14/2020 01/13/2020  Weight (lbs) 172 lb 11.2 oz 174 lb 13.2 oz 171 lb  Weight (kg) 78.336 kg 79.3 kg 77.565 kg     Body mass index is 31.59 kg/m.  General: 86 y.o. female resting comfortably on high flow nasal cannula but in no acute distress. HEENT: Normocephalic  and atraumatic. Sclera clear. EOMs intact. Neck: Supple. No JVD. Heart: Tachycardic with irregularly irregular rhythm. II-III/VI systolic murmur. No rubs or gallops. Radial and distal pedal pulses 2+ and equal bilaterally. Lungs: On 13 L via HFNC. No significant increase work of breathing. Mild crackles noted in right base; otherwise lungs clear to ausculation.  Abdomen: Soft, non-distended, and non-tender to palpation. Bowel sounds present. MSK: Normal strength and tone for age. Extremities: No clubbing, cyanosis, or edema.    Skin: Warm and dry. Neuro: Alert and oriented x3. No focal deficits. Psych: Normal affect. Responds appropriately.   EKG:  The EKG was personally reviewed and demonstrates:   - Initial EKG on 01/13/2020 showed normal sinus rhythm, rate 97 bpm, with ST elevation in AVR and mild ST depression in inferior leads and V4-V6.  - Repeat EKG on 01/15/2020 showed atrial fibrillation, rate 112 bpm, with T wave inversions in V4-V5 and otherwise non-specific ST/T changes. T wave inversions new.  Telemetry:  Telemetry was personally reviewed and demonstrates:  Atrial fibrillation with rates as high as the 160's but currently in the 100's to 110's. Few PVCs/couplets noted.   Relevant CV Studies:  Echocardiogram 10/24/2019: Impressions: 1. Left ventricular ejection fraction, by estimation, is 60 to 65%. The  left ventricle has normal function. The left ventricle has no regional  wall motion abnormalities. Left ventricular diastolic function could not  be evaluated. Elevated left  ventricular end-diastolic pressure.  2. Right ventricular systolic function is normal. The right ventricular  size is normal. There is moderately elevated pulmonary artery systolic  pressure.  3. Left atrial size was mildly dilated.  4. The mitral valve is abnormal. Trivial mitral valve regurgitation.  5. The aortic valve is tricuspid. Aortic valve regurgitation is trivial.  Severe aortic valve  stenosis. Aortic valve area, by VTI measures 0.76 cm.  Aortic valve mean gradient measures 32.0 mmHg. Aortic valve Vmax measures  3.77 m/s.  6. The inferior vena cava is dilated in size with <50% respiratory  variability, suggesting right atrial pressure of 15 mmHg.   Comparison(s): Changes from prior study are noted. 02/02/2018: LVEF 60-65%,  moderate AS - mean gradient 22 mmHg.   Laboratory Data:  High Sensitivity Troponin:  No results for input(s): TROPONINIHS in the last 720 hours.   Chemistry Recent Labs  Lab 01/13/20 0523 01/14/20 0403 01/15/20 0854  NA 139 138 137  K 3.2* 4.7 3.7  CL 105 104 105  CO2 16* 23 22  GLUCOSE 226* 232* 207*  BUN 18 20 21   CREATININE 1.36* 1.06* 0.99  CALCIUM 8.8* 8.5* 8.4*  GFRNONAA 36* 48* 52*  GFRAA 41* 56* >60  ANIONGAP 18* 11 10    Recent Labs  Lab 01/13/20 0523  PROT 6.6  ALBUMIN 3.1*  AST 27  ALT 15  ALKPHOS 66  BILITOT 0.9   Hematology Recent Labs  Lab 01/13/20 0523 01/14/20 0403 01/15/20 0854  WBC 18.1* 22.6* 20.3*  RBC 3.64* 3.12* 3.22*  HGB 9.1* 7.8* 8.0*  HCT 31.8* 27.3* 27.7*  MCV 87.4 87.5 86.0  MCH 25.0* 25.0* 24.8*  MCHC 28.6* 28.6* 28.9*  RDW 22.5* 22.1* 22.2*  PLT 435* 344 408*   BNP Recent Labs  Lab 01/13/20 0523  BNP 277.1*    DDimer No results for input(s): DDIMER in the last 168 hours.   Radiology/Studies:  DG Chest Port 1 View  Result Date: 01/13/2020 CLINICAL DATA:  Shortness of breath EXAM: PORTABLE CHEST 1 VIEW COMPARISON:  10/27/2018 FINDINGS: Borderline heart size. Bilateral septal thickening and patchy airspace type opacity. No visible effusion or air leak. Atherosclerosis. IMPRESSION: Patchy pulmonary opacity, favor atypical pneumonia over CHF. Electronically Signed   By: Monte Fantasia M.D.   On: 01/13/2020 05:38    Assessment and Plan:   Sepsis Secondary to Pneumonia  Acute Hypoxic Respiratory Failure - O2 sats initially in the 60's on EMS arrival. Placed on CPAP. Improved but  still on HFNC 13 L.  - Chest x-ray showed patchy pulmonary opacity which was favored to be atypical pneumonia over CHF.  - Respiratory panel negative (including for COVID-19). - BNP only mildly elevated at 277. Does not appear volume overloaded on exam, but low threshold to diurese with IV Lasix if worsening respiratory status. - Lactic acid elevated at 3.6 but has normalized with fluids.  - WBC elevated at 18.1 >> 22.6 >> 20.3 (some of this likely due to chronic steroid use). - Continue antibiotics per primary team.   Paroxysmal Atrial Fibrillation with RVR - In the setting of sepsis due to pneumonia with acute hypoxic respiratory failure. - Rates as high as the 160's initially (briefly) but now in the 100's to 110's. Asymptomatic with this. - Most recent Echo in 09/2019 showed normal EF and severe AS. - Potassium 3.2 on arrival. Supplemented and 3.7 today. - Magnesium 1.8.  - Will check TSH. - Home Toprol-XL 100mg  restarted this morning.  - CHA2S2-VASc = 6 (CAD, HTN, DM, age x2, female). Continue Eliquis 5mg  twice daily. - Rates reasonably well controlled at this time. Will continue Toprol-XL for now. Suspect tachycardia is due to underlying sepsis and pulmonary status and suspect rates will improve as underlying illness improves.   CAD - History of prior stenting to LAD.  - No recent angina.  - No Aspirin due to need for Eliquis. Continue beta-blocker and statin.  Severe Aortic Stenosis - Noted on last Echo in 09/2019.  - Has not completed work-up for AVR/TAVR.  - Ultimately needs right/left heart catheterization at some point. Given current sepsis, this will likely need to be done as an outpatient once she recovers.  Hypertension - History of hypertension but borderline hypotensive on admission. BP improved and stable. - Home Amlodipine held. Agree with this as this will give [place] more room for rate control. - Home Toprol-XL 100mg  restarted this morning.  Hyperlipidemia - Continue  home Lipitor 40mg  daily.  Type 2 Diabetes Mellitus - Management per primary team.  AKI - Creatinine 1.36 on admission. Baseline around 0.8 to 1.1.  - Improved with fluids.  Hypokalemia - Potassium 3.2 on arrival.  - Supplement and 3.7 today.  - Continue to monitor closely.   Chronic Anemia - Hemoglobin 9.1 on admission and 8.0 today. - Management per primary team.    For questions or updates, please contact [place] Please consult [email or web address] for contact info under    Signed, Darreld Mclean, PA-C  01/15/2020 2:04 PM   Patient seen and examined.  Agree with above recommendation.  Ms. Meno is an 86 year old female with a history of severe aortic stenosis, paroxysmal atrial fibrillation,  CAD status post PCI to LAD, diabetes, hypertension, hyperlipidemia, rheumatoid arthritis, polymyalgia rheumatica who we are consulted to see by Dr. Louanne Belton for evaluation of atrial fibrillation with RVR.  She was diagnosed with atrial fibrillation during admission in May 2021.  She had presented with symptomatic anemia, with hemoglobin down to 7 (from baseline approximately 12).  EGD/colonoscopy was done, which was notable for ulcers in the ileum but no active bleeding seen.  During her admission, she developed new onset atrial fibrillation.  Echocardiogram was done and showed normal LV systolic function but aortic stenosis had progressed to severe (moderate on prior echo 01/2018).  She was started on Eliquis and metoprolol.  She converted to sinus rhythm during admission.  She saw Dr. Irish Lack as an outpatient in June and plan was to undergo cath for work-up of aortic valve replacement.  She was scheduled to see Dr. Irish Lack again tomorrow.  She presented to the ED on 8/16 with fever and nausea/vomiting.  She reported fever to 101F at home and was having episodes of vomiting and shortness of breath.  She called EMS, and on arrival was found to have SPO2 of 68%.  She was started on CPAP with  improvement in SPO2.  In the ED, labs notable for lactate 3.6, creatinine 1.36, hemoglobin 9.1, WBC 18.1, procalcitonin 3.0, Covid negative, UA with greater than 50 WBC, chest x-ray with patchy pulmonary opacity, atypical pneumonia favored over CHF.  Initial EKG showed sinus rhythm, rate 97, 1 mm ST depressions in inferior leads and V5/6.  Overnight she converted into atrial fibrillation.  Telemetry reviewed, shows AF with rates 90s to 120s.  She denies any chest pain or palpitations.  Does report feeling short of breath, but states has improved significantly since admission.  On exam, patient is alert and oriented, irregular, tachycardic, 3 out of 6 systolic murmur, lungs with diffuse crackles, no LE edema.  For her atrial fibrillation with RVR, her CHA2DS2-VASc score is 6, would continue Eliquis.  Rates appear reasonably well controlled, with RVR driven by underlying sepsis.  Continue Toprol-XL.  For her hypoxic respiratory failure, suspect secondary to pneumonia.  Does not appear volume overloaded on exam, but low threshold to diurese with IV Lasix if worsening respiratory status.  For her severe aortic stenosis, she will need further work-up with RHC/LHC.  This can be done as outpatient when she is recovers from her acute illness.  Donato Heinz, MD

## 2020-01-16 ENCOUNTER — Ambulatory Visit: Payer: Medicare Other | Admitting: Interventional Cardiology

## 2020-01-16 DIAGNOSIS — I480 Paroxysmal atrial fibrillation: Secondary | ICD-10-CM

## 2020-01-16 LAB — GLUCOSE, CAPILLARY
Glucose-Capillary: 91 mg/dL (ref 70–99)
Glucose-Capillary: 142 mg/dL — ABNORMAL HIGH (ref 70–99)
Glucose-Capillary: 149 mg/dL — ABNORMAL HIGH (ref 70–99)
Glucose-Capillary: 84 mg/dL (ref 70–99)
Glucose-Capillary: 165 mg/dL — ABNORMAL HIGH (ref 70–99)

## 2020-01-16 LAB — COMPREHENSIVE METABOLIC PANEL
ALT: 22 U/L (ref 0–44)
AST: 29 U/L (ref 15–41)
Albumin: 2.7 g/dL — ABNORMAL LOW (ref 3.5–5.0)
Alkaline Phosphatase: 54 U/L (ref 38–126)
Anion gap: 10 (ref 5–15)
BUN: 19 mg/dL (ref 8–23)
CO2: 24 mmol/L (ref 22–32)
Calcium: 8.9 mg/dL (ref 8.9–10.3)
Chloride: 106 mmol/L (ref 98–111)
Creatinine, Ser: 1.1 mg/dL — ABNORMAL HIGH (ref 0.44–1.00)
GFR calc Af Amer: 53 mL/min — ABNORMAL LOW (ref >60)
GFR calc non Af Amer: 46 mL/min — ABNORMAL LOW (ref >60)
Glucose, Bld: 99 mg/dL (ref 70–99)
Potassium: 3.9 mmol/L (ref 3.5–5.1)
Sodium: 140 mmol/L (ref 135–145)
Total Bilirubin: 0.9 mg/dL (ref 0.3–1.2)
Total Protein: 6.3 g/dL — ABNORMAL LOW (ref 6.5–8.1)

## 2020-01-16 LAB — CBC
HCT: 30.3 % — ABNORMAL LOW (ref 36.0–46.0)
Hemoglobin: 9 g/dL — ABNORMAL LOW (ref 12.0–15.0)
MCH: 25.5 pg — ABNORMAL LOW (ref 26.0–34.0)
MCHC: 29.7 g/dL — ABNORMAL LOW (ref 30.0–36.0)
MCV: 85.8 fL (ref 80.0–100.0)
Platelets: 500 10*3/uL — ABNORMAL HIGH (ref 150–400)
RBC: 3.53 MIL/uL — ABNORMAL LOW (ref 3.87–5.11)
RDW: 22 % — ABNORMAL HIGH (ref 11.5–15.5)
WBC: 16.4 10*3/uL — ABNORMAL HIGH (ref 4.0–10.5)
nRBC: 0.1 % (ref 0.0–0.2)

## 2020-01-16 LAB — PHOSPHORUS: Phosphorus: 3.3 mg/dL (ref 2.5–4.6)

## 2020-01-16 LAB — MAGNESIUM: Magnesium: 1.6 mg/dL — ABNORMAL LOW (ref 1.7–2.4)

## 2020-01-16 LAB — BRAIN NATRIURETIC PEPTIDE: B Natriuretic Peptide: 1208.3 pg/mL — ABNORMAL HIGH (ref 0.0–100.0)

## 2020-01-16 MED ORDER — MAGNESIUM SULFATE 2 GM/50ML IV SOLN
2.0000 g | Freq: Once | INTRAVENOUS | Status: AC
  Administered 2020-01-16: 2 g via INTRAVENOUS
  Filled 2020-01-16: qty 50

## 2020-01-16 MED ORDER — FUROSEMIDE 10 MG/ML IJ SOLN
20.0000 mg | Freq: Once | INTRAMUSCULAR | Status: AC
  Administered 2020-01-16: 20 mg via INTRAVENOUS
  Filled 2020-01-16: qty 2

## 2020-01-16 NOTE — Progress Notes (Signed)
Progress Note  Patient Name: Sophia Ward Date of Encounter: 01/16/2020  [facility] Cardiologist: Larae Grooms, MD   Subjective   No CP or dyspnea  Inpatient Medications    Scheduled Meds: . acetaminophen  650 mg Oral QHS  . apixaban  5 mg Oral BID  . atorvastatin  40 mg Oral QHS  . Chlorhexidine Gluconate Cloth  6 each Topical Daily  . escitalopram  5 mg Oral Daily  . folic acid  2 mg Oral Daily  . guaiFENesin  600 mg Oral BID  . insulin aspart  0-15 Units Subcutaneous TID WC  . insulin aspart  0-5 Units Subcutaneous QHS  . insulin aspart  6 Units Subcutaneous TID with meals  . insulin detemir  14 Units Subcutaneous q morning - 10a  . methotrexate  12.5 mg Oral Q Wed  . metoprolol succinate  100 mg Oral q morning - 10a  . pantoprazole  40 mg Oral QPC lunch  . predniSONE  4 mg Oral Q breakfast  . sodium chloride flush  3 mL Intravenous Q12H   Continuous Infusions: . sodium chloride 250 mL (01/15/20 0653)  . azithromycin 500 mg (01/16/20 0826)  . cefTRIAXone (ROCEPHIN)  IV 1 g (01/16/20 0603)   PRN Meds: sodium chloride, acetaminophen **OR** [DISCONTINUED] acetaminophen, estradiol, ipratropium-albuterol, ondansetron **OR** ondansetron (ZOFRAN) IV, sodium chloride flush, zolpidem   Vital Signs    Vitals:   01/16/20 0019 01/16/20 0518 01/16/20 0741 01/16/20 0826  BP: 108/73 100/70  103/66  Pulse: 84 (!) 101 (!) 115 (!) 120  Resp: 18 20 18    Temp: 98.1 F (36.7 C) 98.7 F (37.1 C)    TempSrc: Oral Oral    SpO2: 96% 90%    Weight: 78.9 kg     Height:        Intake/Output Summary (Last 24 hours) at 01/16/2020 0948 Last data filed at 01/16/2020 0655 Gross per 24 hour  Intake 1536.23 ml  Output 950 ml  Net 586.23 ml   Last 3 Weights 01/16/2020 01/15/2020 01/14/2020  Weight (lbs) 174 lb 172 lb 11.2 oz 174 lb 13.2 oz  Weight (kg) 78.926 kg 78.336 kg 79.3 kg      Telemetry    Atrial fibrillation; rate upper normal - Personally  Reviewed  Physical Exam   GEN: No acute distress.   Neck: No JVD Cardiac:  Irregular, 3/6 systolic murmur. Respiratory: Clear to auscultation bilaterally. GI: Soft, nontender, non-distended  MS: No edema Neuro:  Nonfocal  Psych: Normal affect   Labs     Chemistry Recent Labs  Lab 01/13/20 0523 01/13/20 0523 01/14/20 0403 01/15/20 0854 01/16/20 0636  NA 139   < > 138 137 140  K 3.2*   < > 4.7 3.7 3.9  CL 105   < > 104 105 106  CO2 16*   < > 23 22 24   GLUCOSE 226*   < > 232* 207* 99  BUN 18   < > 20 21 19   CREATININE 1.36*   < > 1.06* 0.99 1.10*  CALCIUM 8.8*   < > 8.5* 8.4* 8.9  PROT 6.6  --   --   --  6.3*  ALBUMIN 3.1*  --   --   --  2.7*  AST 27  --   --   --  29  ALT 15  --   --   --  22  ALKPHOS 66  --   --   --  54  BILITOT 0.9  --   --   --  0.9  GFRNONAA 36*   < > 48* 52* 46*  GFRAA 41*   < > 56* >60 53*  ANIONGAP 18*   < > 11 10 10    < > = values in this interval not displayed.     Hematology Recent Labs  Lab 01/14/20 0403 01/15/20 0854 01/16/20 0636  WBC 22.6* 20.3* 16.4*  RBC 3.12* 3.22* 3.53*  HGB 7.8* 8.0* 9.0*  HCT 27.3* 27.7* 30.3*  MCV 87.5 86.0 85.8  MCH 25.0* 24.8* 25.5*  MCHC 28.6* 28.9* 29.7*  RDW 22.1* 22.2* 22.0*  PLT 344 408* 500*    BNP Recent Labs  Lab 01/13/20 0523 01/16/20 0636  BNP 277.1* 1,208.3*     Patient Profile     Sophia Ward is a 86 y.o. female with a history of CAD with prior stenting to LAD, severe aortic stenosis, paroxysmal atrial fibrillation on Eliquis, hypertension, hyperlipidemia, diabetes mellitus, chronic anemia, spinal stenosis with chronic back pain, rheumatoid arthritis on Methotrexate, polymalgia rheumatica on chronic Prednisone, GERD, and vertigo who is being seen for the evaluation of atrial fibrillation with RVR at the request of Dr. Louanne Belton.  Patient admitted with pneumonia and cardiology asked to evaluate for atrial fibrillation.  Echocardiogram May 2021 showed normal LV function, mild  left atrial enlargement, trace aortic and mitral regurgitation and severe aortic stenosis (mean gradient 32 mmHg, aortic valve area of 0.76 cm).  Plan previously was consideration of cardiac catheterization in anticipation of aortic valve replacement.  Assessment & Plan    1 paroxysmal atrial fibrillation-patient remains in atrial fibrillation today but is relatively asymptomatic.  Likely related to the stress of her pneumonia.  Continue Toprol for rate control (heart rate should also improve as her pneumonia improves).  Continue apixaban.  If atrial fibrillation persists could proceed with cardioversion as an outpatient.  2 pneumonia-continue antibiotics per primary care.  3 mild volume excess-BNP elevated.  May be a small component of volume excess.  We will give Lasix 20 mg x 1.  4 history of severe aortic stenosis-plan had been cardiac catheterization in anticipation of aortic valve replacement.  This can be rescheduled as an outpatient once she recovers from her pneumonia.  5 coronary artery disease-continue statin.  She is not on aspirin given need for apixaban.  For questions or updates, please contact [place] Please consult [email or web address] for contact info under        Signed, Kirk Ruths, MD  01/16/2020, 9:48 AM

## 2020-01-16 NOTE — Progress Notes (Signed)
PROGRESS NOTE  Sophia Ward:[id number] DOB: [birth year] DOA: 01/13/2020 PCP: [name], MD   LOS: 3 days   Brief narrative: As per HPI,  86 year old with a history of hypertension, hyperlipidemia, paroxysmal atrial fibrillation, coronary artery disease  status post stents, aortic stenosis, diabetes mellitus type 2, rheumatoid arthritis on methotrexate, PMR on chronic prednisone, and spinal stenosis  presented to the hospital with 3 days of progressively worsening shortness of breath, nausea, vomiting, and diarrhea.  She admits to having chronic diarrhea but noted increased frequency.  She noted that she has been fully vaccinated but had a recent Covid exposure.  EMS was called in and the patient's initial room air saturation was 68%.  In the ED, chest x-ray revealed patchy pulmonary opacities suggestive of atypical pneumonia.  She was then admitted to hospital for further evaluation and treatment.  Assessment/Plan:  Principal Problem:   Sepsis due to pneumonia [facility]) Active Problems:   Obesity, unspecified   Rheumatoid arthritis involving multiple sites with positive rheumatoid factor (HCC)   Elevated sed rate   Polymyalgia rheumatica (HCC)   Iron deficiency anemia   Transient hypotension   Acute respiratory failure with hypoxia (HCC)   Pressure injury of skin  Acute hypoxic respiratory failure likely secondary to pneumonia with sepsis.  Present on admission.   Chest x-ray showed patchy pulmonary opacity suggestive of atypical pneumonia.  Question methotrexate induced lung damage, on the background of severe aortic stenosis, atrial fibrillation, obesity..   Patient had lactic acidosis with lactate of 3.6 which has normalized 1.5 at this time.  Continue IV Rocephin and azithromycin.  Respiratory viral panel negative COVID-19 including covid 19..  Legionella antigen negative.  Blood cultures have been negative to date.  Temperature max of 98.6.  Fahrenheit.  Currently on 13  L/min of high flow nasal cannula.  Try to wean as possible.  Patient will receive one dose of IV Lasix today.  Significant leukocytosis secondary to pneumonia UTI and use of steroids.  We will continue to monitor.  WBC has trended down to 16.4 today.  Acute on chronic paroxysmal atrial fibrillation on chronic anticoagulation On Eliquis.  on metoprolol succinate long-acting from home.  Has been seen by cardiology.  E. coli UTI.  On Rocephin IV.    Urine culture showed more than 100,000 E. Coli.  Hypotension Improved..  on long-acting metoprolol.  Continue to hold ACE inhibitor.  Seen by cardiology.  Acute kidney injury Resolved at this time.  Hypokalemia Corrected with supplementation.  Latest potassium of 3.9  Diabetes mellitus type 2.  Continue sliding scale insulin, prandial insulin and Levemir at bedtime.  Accu-Cheks diabetic diet. Hemoglobin A1c on 01/13/2020 was 5.7. overall better.  Chronic iron deficiency anemia Received IV iron.  Will likely need iron supplements on discharge.   Severe Aortic stenosis Seen by cardiology. Will need outpatient follow up regarding valve replacement as outpatient.  Chronic diastolic CHF Continue Lipitor, Eliquis.   Not on diuretics at home.    Patient is currently hypoxic requiring high flow oxygen.  Receive 1 dose of IV Lasix as per cardiology today.  BNP of 1208  Rheumatoid arthritis /polymyalgia rheumatica On chronic immunosuppressive therapy to include methotrexate and steroids.  Patient on low-dose prednisone at baseline.  Continue for now..  Hyperlipidemia.  On Lipitor at home.  GERD.  Continue PPI   Hypomagnesemia.  Will replenish with IV magnesium x1.  Obesity - Body mass index is 31.28 kg/m.  Stage I buttocks pressure ulceration.  Present  on admission.  We will continue preventive protocol. Pressure Injury 01/14/20 Buttocks Stage 1 -  Intact skin with non-blanchable redness of a localized area usually over a bony  prominence. (Active)  01/14/20 2350  Location: Buttocks  Location Orientation:   Staging: Stage 1 -  Intact skin with non-blanchable redness of a localized area usually over a bony prominence.  Wound Description (Comments):   Present on Admission: Yes    DVT prophylaxis:  apixaban (ELIQUIS) tablet 5 mg    Code Status: Full code  Family Communication: Spoke with the patient's husband at bedside.   Status is: Inpatient  Remains inpatient appropriate because:IV treatments appropriate due to intensity of illness or inability to take PO and Inpatient level of care appropriate due to severity of illness, new atrial fibrillation with RVR, high oxygen requirement, acute hypoxic respiratory failure, cardiology evaluation, will need physical therapy evaluation   Dispo: The patient is from: Home              Anticipated d/c is to: Undetermined, will need PT evaluation.  Possible home with home health              Anticipated d/c date is: 2 days              Patient currently is not medically stable to d/c.  Consultants:  Cardiology  Procedures:  None  Antibiotics:  . Rocephin IV 8/16> . Zithromax iv 8/16>  Anti-infectives (From admission, onward)   Start     Dose/Rate Route Frequency Ordered Stop   01/13/20 0645  cefTRIAXone (ROCEPHIN) 1 g in sodium chloride 0.9 % 100 mL IVPB        1 g 200 mL/hr over 30 Minutes Intravenous Every 24 hours 01/13/20 0633     01/13/20 0645  azithromycin (ZITHROMAX) 500 mg in sodium chloride 0.9 % 250 mL IVPB        500 mg 250 mL/hr over 60 Minutes Intravenous Every 24 hours 01/13/20 0633     01/13/20 0630  levofloxacin (LEVAQUIN) IVPB 750 mg  Status:  Discontinued        750 mg 100 mL/hr over 90 Minutes Intravenous  Once 01/13/20 0626 01/13/20 2703     Subjective: Today, patient complains of mild shortness of breath and dyspnea with cough.  No chest pain.  No palpitation dizziness or lightheadedness.  Objective: Vitals:   01/16/20 0741  01/16/20 0826  BP:  103/66  Pulse: (!) 115 (!) 120  Resp: 18   Temp:    SpO2:      Intake/Output Summary (Last 24 hours) at 01/16/2020 1034 Last data filed at 01/16/2020 0655 Gross per 24 hour  Intake 400 ml  Output 950 ml  Net -550 ml   Filed Weights   01/14/20 1622 01/15/20 0044 01/16/20 0019  Weight: 79.3 kg 78.3 kg 78.9 kg   Body mass index is 31.83 kg/m.   Physical Exam:  GENERAL: Patient is alert awake and oriented. Not in obvious distress.  Obese, on high flow nasal cannula oxygen HENT: No scleral pallor or icterus. Pupils equally reactive to light. Oral mucosa is moist NECK: is supple, no gross swelling noted. CHEST:  Diminished breath sounds bilaterally.  Coarse breath sounds noted bilaterally. CVS: S1 and S2 heard, systolic murmur, irregular rhythm, tachycardia ABDOMEN: Soft, non-tender, bowel sounds are present. EXTREMITIES: Trace lower extremity edema CNS: Cranial nerves are intact. No focal motor deficits. SKIN: warm and dry without rashes.  Data Review: I have personally reviewed the  following laboratory data and studies,  CBC: Recent Labs  Lab 01/13/20 0523 01/14/20 0403 01/15/20 0854 01/16/20 0636  WBC 18.1* 22.6* 20.3* 16.4*  NEUTROABS 16.5*  --   --   --   HGB 9.1* 7.8* 8.0* 9.0*  HCT 31.8* 27.3* 27.7* 30.3*  MCV 87.4 87.5 86.0 85.8  PLT 435* 344 408* 300*   Basic Metabolic Panel: Recent Labs  Lab 01/13/20 0523 01/14/20 0403 01/15/20 0854 01/16/20 0636  NA 139 138 137 140  K 3.2* 4.7 3.7 3.9  CL 105 104 105 106  CO2 16* 23 22 24   GLUCOSE 226* 232* 207* 99  BUN 18 20 21 19   CREATININE 1.36* 1.06* 0.99 1.10*  CALCIUM 8.8* 8.5* 8.4* 8.9  MG  --   --  1.8 1.6*  PHOS  --   --   --  3.3   Liver Function Tests: Recent Labs  Lab 01/13/20 0523 01/16/20 0636  AST 27 29  ALT 15 22  ALKPHOS 66 54  BILITOT 0.9 0.9  PROT 6.6 6.3*  ALBUMIN 3.1* 2.7*   No results for input(s): LIPASE, AMYLASE in the last 168 hours. No results for  input(s): AMMONIA in the last 168 hours. Cardiac Enzymes: No results for input(s): CKTOTAL, CKMB, CKMBINDEX, TROPONINI in the last 168 hours. BNP (last 3 results) Recent Labs    01/13/20 0523 01/16/20 0636  BNP 277.1* 1,208.3*    ProBNP (last 3 results) No results for input(s): PROBNP in the last 8760 hours.  CBG: Recent Labs  Lab 01/15/20 0728 01/15/20 1147 01/15/20 1640 01/15/20 2116 01/16/20 0615  GLUCAP 136* 142* 95 126* 91   Recent Results (from the past 240 hour(s))  Urine culture     Status: Abnormal   Collection Time: 01/13/20  5:26 AM   Specimen: In/Out Cath Urine  Result Value Ref Range Status   Specimen Description IN/OUT CATH URINE  Final   Special Requests   Final    NONE Performed at [facility], [place] [address], [place], [place] [address]    Culture >=100,000 COLONIES/mL ESCHERICHIA COLI (A)  Final   Report Status 01/15/2020 FINAL  Final   Organism ID, Bacteria ESCHERICHIA COLI (A)  Final      Susceptibility   Escherichia coli - MIC*    AMPICILLIN 4 SENSITIVE Sensitive     CEFAZOLIN <=4 SENSITIVE Sensitive     CEFTRIAXONE <=0.25 SENSITIVE Sensitive     CIPROFLOXACIN <=0.25 SENSITIVE Sensitive     GENTAMICIN <=1 SENSITIVE Sensitive     IMIPENEM <=0.25 SENSITIVE Sensitive     NITROFURANTOIN <=16 SENSITIVE Sensitive     TRIMETH/SULFA <=20 SENSITIVE Sensitive     AMPICILLIN/SULBACTAM <=2 SENSITIVE Sensitive     PIP/TAZO <=4 SENSITIVE Sensitive     * >=100,000 COLONIES/mL ESCHERICHIA COLI  Blood Culture (routine x 2)     Status: None (Preliminary result)   Collection Time: 01/13/20  5:26 AM   Specimen: BLOOD RIGHT ARM  Result Value Ref Range Status   Specimen Description BLOOD RIGHT ARM  Final   Special Requests   Final    BOTTLES DRAWN AEROBIC AND ANAEROBIC BLOOD RIGHT ARM   Culture   Final    NO GROWTH 3 DAYS Performed at [facility], [address], [place], [place] [address]    Report Status PENDING  Incomplete  Blood Culture  (routine x 2)     Status: None (Preliminary result)   Collection Time: 01/13/20  5:31 AM  Specimen: BLOOD LEFT ARM  Result Value Ref Range Status   Specimen Description BLOOD LEFT ARM  Final   Special Requests   Final    BOTTLES DRAWN AEROBIC AND ANAEROBIC Blood Culture results may not be optimal due to an inadequate volume of blood received in culture bottles   Culture   Final    NO GROWTH 3 DAYS Performed at [facility], [place] [address], [place], [place] [address]    Report Status PENDING  Incomplete  SARS Coronavirus 2 by RT PCR (hospital order, performed in [facility] hospital lab) Nasopharyngeal Nasopharyngeal Swab     Status: None   Collection Time: 01/13/20  6:44 AM   Specimen: Nasopharyngeal Swab  Result Value Ref Range Status   SARS Coronavirus 2 NEGATIVE NEGATIVE Final    Comment: (NOTE) SARS-CoV-2 target nucleic acids are NOT DETECTED.  The SARS-CoV-2 RNA is generally detectable in upper and lower respiratory specimens during the acute phase of infection. The lowest concentration of SARS-CoV-2 viral copies this assay can detect is 250 copies / mL. A negative result does not preclude SARS-CoV-2 infection and should not be used as the sole basis for treatment or other patient management decisions.  A negative result may occur with improper specimen collection / handling, submission of specimen other than nasopharyngeal swab, presence of viral mutation(s) within the areas targeted by this assay, and inadequate number of viral copies (<250 copies / mL). A negative result must be combined with clinical observations, patient history, and epidemiological information.  Fact Sheet for Patients:   StrictlyIdeas.no  Fact Sheet for Healthcare Providers: BankingDealers.co.za  This test is not yet approved or  cleared by the [place] FDA and has been authorized for detection and/or diagnosis of SARS-CoV-2 by FDA under an  Emergency Use Authorization (EUA).  This EUA will remain in effect (meaning this test can be used) for the duration of the COVID-19 declaration under Section 564(b)(1) of the Act, 21 U.S.C. section 360bbb-3(b)(1), unless the authorization is terminated or revoked sooner.  Performed at [facility], [place] [address], [place], [place] [address]   Respiratory Panel by PCR     Status: None   Collection Time: 01/14/20  5:54 PM   Specimen: Nasopharyngeal Swab; Respiratory  Result Value Ref Range Status   Adenovirus NOT DETECTED NOT DETECTED Final   Coronavirus 229E NOT DETECTED NOT DETECTED Final    Comment: (NOTE) The Coronavirus on the Respiratory Panel, DOES NOT test for the novel  Coronavirus (2019 nCoV)    Coronavirus HKU1 NOT DETECTED NOT DETECTED Final   Coronavirus NL63 NOT DETECTED NOT DETECTED Final   Coronavirus OC43 NOT DETECTED NOT DETECTED Final   Metapneumovirus NOT DETECTED NOT DETECTED Final   Rhinovirus / Enterovirus NOT DETECTED NOT DETECTED Final   Influenza A NOT DETECTED NOT DETECTED Final   Influenza B NOT DETECTED NOT DETECTED Final   Parainfluenza Virus 1 NOT DETECTED NOT DETECTED Final   Parainfluenza Virus 2 NOT DETECTED NOT DETECTED Final   Parainfluenza Virus 3 NOT DETECTED NOT DETECTED Final   Parainfluenza Virus 4 NOT DETECTED NOT DETECTED Final   Respiratory Syncytial Virus NOT DETECTED NOT DETECTED Final   Bordetella pertussis NOT DETECTED NOT DETECTED Final   Chlamydophila pneumoniae NOT DETECTED NOT DETECTED Final   Mycoplasma pneumoniae NOT DETECTED NOT DETECTED Final    Comment: Performed at [facility], [place]. [address] [place], [place] [address]     Studies: No results found.  Flora Lipps, MD  [facility] 01/16/2020

## 2020-01-17 ENCOUNTER — Inpatient Hospital Stay (HOSPITAL_COMMUNITY): Payer: Medicare Other

## 2020-01-17 DIAGNOSIS — J9601 Acute respiratory failure with hypoxia: Secondary | ICD-10-CM

## 2020-01-17 DIAGNOSIS — I5033 Acute on chronic diastolic (congestive) heart failure: Secondary | ICD-10-CM

## 2020-01-17 DIAGNOSIS — A419 Sepsis, unspecified organism: Principal | ICD-10-CM

## 2020-01-17 DIAGNOSIS — J189 Pneumonia, unspecified organism: Secondary | ICD-10-CM

## 2020-01-17 LAB — CBC
HCT: 31 % — ABNORMAL LOW (ref 36.0–46.0)
Hemoglobin: 9 g/dL — ABNORMAL LOW (ref 12.0–15.0)
MCH: 24.6 pg — ABNORMAL LOW (ref 26.0–34.0)
MCHC: 29 g/dL — ABNORMAL LOW (ref 30.0–36.0)
MCV: 84.7 fL (ref 80.0–100.0)
Platelets: 522 10*3/uL — ABNORMAL HIGH (ref 150–400)
RBC: 3.66 MIL/uL — ABNORMAL LOW (ref 3.87–5.11)
RDW: 22 % — ABNORMAL HIGH (ref 11.5–15.5)
WBC: 14.7 10*3/uL — ABNORMAL HIGH (ref 4.0–10.5)
nRBC: 0 % (ref 0.0–0.2)

## 2020-01-17 LAB — COMPREHENSIVE METABOLIC PANEL
ALT: 20 U/L (ref 0–44)
AST: 23 U/L (ref 15–41)
Albumin: 2.6 g/dL — ABNORMAL LOW (ref 3.5–5.0)
Alkaline Phosphatase: 51 U/L (ref 38–126)
Anion gap: 13 (ref 5–15)
BUN: 17 mg/dL (ref 8–23)
CO2: 22 mmol/L (ref 22–32)
Calcium: 8.8 mg/dL — ABNORMAL LOW (ref 8.9–10.3)
Chloride: 104 mmol/L (ref 98–111)
Creatinine, Ser: 1.09 mg/dL — ABNORMAL HIGH (ref 0.44–1.00)
GFR calc Af Amer: 54 mL/min — ABNORMAL LOW (ref >60)
GFR calc non Af Amer: 47 mL/min — ABNORMAL LOW (ref >60)
Glucose, Bld: 133 mg/dL — ABNORMAL HIGH (ref 70–99)
Potassium: 3.5 mmol/L (ref 3.5–5.1)
Sodium: 139 mmol/L (ref 135–145)
Total Bilirubin: 0.5 mg/dL (ref 0.3–1.2)
Total Protein: 6.2 g/dL — ABNORMAL LOW (ref 6.5–8.1)

## 2020-01-17 LAB — GLUCOSE, CAPILLARY
Glucose-Capillary: 150 mg/dL — ABNORMAL HIGH (ref 70–99)
Glucose-Capillary: 119 mg/dL — ABNORMAL HIGH (ref 70–99)
Glucose-Capillary: 125 mg/dL — ABNORMAL HIGH (ref 70–99)
Glucose-Capillary: 127 mg/dL — ABNORMAL HIGH (ref 70–99)
Glucose-Capillary: 121 mg/dL — ABNORMAL HIGH (ref 70–99)

## 2020-01-17 LAB — MAGNESIUM: Magnesium: 1.8 mg/dL (ref 1.7–2.4)

## 2020-01-17 MED ORDER — MAGNESIUM OXIDE 400 (241.3 MG) MG PO TABS
400.0000 mg | ORAL_TABLET | Freq: Two times a day (BID) | ORAL | Status: DC
  Administered 2020-01-17 – 2020-01-18 (×3): 400 mg via ORAL
  Filled 2020-01-17 (×3): qty 1

## 2020-01-17 MED ORDER — POTASSIUM CHLORIDE CRYS ER 20 MEQ PO TBCR
40.0000 meq | EXTENDED_RELEASE_TABLET | Freq: Once | ORAL | Status: AC
  Administered 2020-01-17: 40 meq via ORAL
  Filled 2020-01-17: qty 2

## 2020-01-17 MED ORDER — FUROSEMIDE 10 MG/ML IJ SOLN: 20.0000 mg | Freq: Once | INTRAMUSCULAR | Status: DC

## 2020-01-17 MED ORDER — FUROSEMIDE 10 MG/ML IJ SOLN
40.0000 mg | Freq: Every day | INTRAMUSCULAR | Status: AC
  Administered 2020-01-17 – 2020-01-18 (×2): 40 mg via INTRAVENOUS
  Filled 2020-01-17 (×2): qty 4

## 2020-01-17 NOTE — Plan of Care (Signed)
  Problem: Nutrition: Goal: Adequate nutrition will be maintained Outcome: Completed/Met   Problem: Coping: Goal: Level of anxiety will decrease Outcome: Completed/Met   Problem: Elimination: Goal: Will not experience complications related to bowel motility Outcome: Completed/Met   Problem: Pain Managment: Goal: General experience of comfort will improve Outcome: Completed/Met   Problem: Safety: Goal: Ability to remain free from injury will improve Outcome: Completed/Met

## 2020-01-17 NOTE — Progress Notes (Signed)
PROGRESS NOTE  Sophia Ward:[id number] DOB: Oct 11, 1932 DOA: 01/13/2020 PCP: [name], MD   LOS: 4 days   Brief narrative: As per HPI,  86 year old with a history of hypertension, hyperlipidemia, paroxysmal atrial fibrillation, coronary artery disease  status post stents, aortic stenosis, diabetes mellitus type 2, rheumatoid arthritis on methotrexate, PMR on chronic prednisone, and spinal stenosis  presented to the hospital with 3 days of progressively worsening shortness of breath, nausea, vomiting, and diarrhea.  She admits to having chronic diarrhea but noted increased frequency.  She noted that she has been fully vaccinated but had a recent Covid exposure.  EMS was called in and the patient's initial room air saturation was 68%.  In the ED, chest x-ray revealed patchy pulmonary opacities suggestive of atypical pneumonia.  She was then admitted to hospital for further evaluation and treatment.  Assessment/Plan:  Principal Problem:   Sepsis due to pneumonia [facility]) Active Problems:   Obesity, unspecified   Rheumatoid arthritis involving multiple sites with positive rheumatoid factor (HCC)   Elevated sed rate   Polymyalgia rheumatica (HCC)   Iron deficiency anemia   Transient hypotension   Acute respiratory failure with hypoxia (HCC)   Pressure injury of skin  Acute hypoxic respiratory failure likely secondary to pneumonia with sepsis.  Present on admission.   Persistent hypoxic respiratory failure at this time.  Chest x-ray showed patchy pulmonary opacity suggestive of atypical pneumonia.  Question methotrexate induced lung issue on the background of severe aortic stenosis, atrial fibrillation, obesity.   Patient had lactic acidosis with lactate of 3.6 which has normalized 1.5 at this time. On  IV Rocephin and azithromycin.  Will complete 5-day course of azithromycin today.  Continue Rocephin for total of 7 days.  Respiratory viral panel negative. COVID-19 including covid  19..  Legionella antigen negative.  Blood cultures have been negative to date.  Temperature max of 98.8 Fahrenheit.  Currently on 11 L/min of high flow nasal cannula.  Try to wean as possible.  Received one dose of lasix without much improvement.  Spoke with pulmonary for consultation.  Cardiology has referred the patient today and have added IV Lasix 40mg .  Will closely monitor.  Significant leukocytosis secondary to pneumonia UTI and use of steroids.  We will continue to monitor.  Leukocytosis trending down to 14.7 today from 16.4.  Closely monitor.  Thrombocytosis.  Likely reactive.  Closely monitor.  Acute on chronic paroxysmal atrial fibrillation on chronic anticoagulation On Eliquis.  on metoprolol succinate long-acting from home.  Has been seen by cardiology.  Overall heart rate has improved.  TSH of 1.1.  E. coli UTI.  On Rocephin IV.    Urine culture showed more than 100,000 E. Coli.  Hypotension Improved. on long-acting metoprolol.  Continue to hold ACE inhibitor.  Seen by cardiology.  Latest blood pressure of 107/79  Acute kidney injury Resolved at this time.  BMP with creatinine up to 1.09  Hypokalemia   Latest potassium of 3.5  Mild hypomagnesemia.  Corrected with IV magnesium sulfate yesterday.  Magnesium level of 1.8 today.  We will continue oral magnesium oxide  Diabetes mellitus type 2.  Continue sliding scale insulin, prandial insulin and Levemir at bedtime.  Accu-Cheks diabetic diet. Hemoglobin A1c on 01/13/2020 was 5.7. overall better.  POC glucose of 119.  Chronic iron deficiency anemia Received IV iron x1.  Will likely need iron supplements on discharge.   Severe Aortic stenosis Seen by cardiology. Will need outpatient follow up regarding valve replacement  as outpatient.  Chronic diastolic CHF Continue Lipitor, Eliquis.   Not on diuretics at home.    Patient is currently hypoxic requiring high flow oxygen 11 L/min.Marland Kitchen  Receive 1 dose of IV Lasix as per  cardiology on 01/16/2020  BNP of 1208  Rheumatoid arthritis /polymyalgia rheumatica On chronic immunosuppressive therapy to include methotrexate and steroids.  Patient on low-dose prednisone at baseline.  Continue for now..  Hyperlipidemia.  On Lipitor at home.  GERD.  Continue PPI   Obesity - Body mass index is 31.28 kg/m.  Stage I buttocks pressure ulceration.  Present on admission.  We will continue preventive protocol. Pressure Injury 01/14/20 Buttocks Stage 1 -  Intact skin with non-blanchable redness of a localized area usually over a bony prominence. (Active)  01/14/20 2350  Location: Buttocks  Location Orientation:   Staging: Stage 1 -  Intact skin with non-blanchable redness of a localized area usually over a bony prominence.  Wound Description (Comments):   Present on Admission: Yes    DVT prophylaxis:  apixaban (ELIQUIS) tablet 5 mg    Code Status: Full code  Family Communication: Spoke with the patient's husband at bedside.   Status is: Inpatient  Remains inpatient appropriate because:IV treatments appropriate due to intensity of illness or inability to take PO and Inpatient level of care appropriate due to severity of illness, high oxygen requirement, acute hypoxic respiratory failure, will need physical therapy evaluation, consult pulmonary.   Dispo: The patient is from: Home              Anticipated d/c is to: Seen by physical therapy recommend home health PT on discharge               Anticipated d/c date is: 2 days              Patient currently is not medically stable to d/c.  Consultants:  Cardiology  Pulmonary consult:  Procedures:  None  Antibiotics:  . Rocephin IV 8/16> . Zithromax iv 8/16>  Anti-infectives (From admission, onward)   Start     Dose/Rate Route Frequency Ordered Stop   01/13/20 0645  cefTRIAXone (ROCEPHIN) 1 g in sodium chloride 0.9 % 100 mL IVPB        1 g 200 mL/hr over 30 Minutes Intravenous Every 24 hours 01/13/20  0633     01/13/20 0645  azithromycin (ZITHROMAX) 500 mg in sodium chloride 0.9 % 250 mL IVPB        500 mg 250 mL/hr over 60 Minutes Intravenous Every 24 hours 01/13/20 0633     01/13/20 0630  levofloxacin (LEVAQUIN) IVPB 750 mg  Status:  Discontinued        750 mg 100 mL/hr over 90 Minutes Intravenous  Once 01/13/20 0626 01/13/20 2130     Subjective: Today, denies any dyspnea, cough, fever chills or rigor.  She is still on high flow oxygen.  Husband at bedside  Objective: Vitals:   01/17/20 0144 01/17/20 0600  BP:  107/79  Pulse: 60   Resp: 16   Temp:  98.4 F (36.9 C)  SpO2: 98%     Intake/Output Summary (Last 24 hours) at 01/17/2020 0744 Last data filed at 01/17/2020 0600 Gross per 24 hour  Intake 920 ml  Output 2675 ml  Net -1755 ml   Filed Weights   01/15/20 0044 01/16/20 0019 01/17/20 0600  Weight: 78.3 kg 78.9 kg 77.4 kg   Body mass index is 31.22 kg/m.   Physical Exam:  GENERAL: Patient is alert awake and oriented. Not in obvious distress.  Obese, on high flow nasal cannula oxygen HENT: No scleral pallor or icterus. Pupils equally reactive to light. Oral mucosa is moist NECK: is supple, no gross swelling noted. CHEST:  Diminished breath sounds bilaterally.  Coarse breath sounds noted bilaterally. CVS: S1 and S2 heard, systolic murmur, irregular rhythm, tachycardia ABDOMEN: Soft, non-tender, bowel sounds are present. EXTREMITIES: Trace lower extremity edema CNS: Cranial nerves are intact. No focal motor deficits. SKIN: warm and dry without rashes.  Data Review: I have personally reviewed the following laboratory data and studies,  CBC: Recent Labs  Lab 01/13/20 0523 01/14/20 0403 01/15/20 0854 01/16/20 0636  WBC 18.1* 22.6* 20.3* 16.4*  NEUTROABS 16.5*  --   --   --   HGB 9.1* 7.8* 8.0* 9.0*  HCT 31.8* 27.3* 27.7* 30.3*  MCV 87.4 87.5 86.0 85.8  PLT 435* 344 408* 366*   Basic Metabolic Panel: Recent Labs  Lab 01/13/20 0523 01/14/20 0403  01/15/20 0854 01/16/20 0636  NA 139 138 137 140  K 3.2* 4.7 3.7 3.9  CL 105 104 105 106  CO2 16* 23 22 24   GLUCOSE 226* 232* 207* 99  BUN 18 20 21 19   CREATININE 1.36* 1.06* 0.99 1.10*  CALCIUM 8.8* 8.5* 8.4* 8.9  MG  --   --  1.8 1.6*  PHOS  --   --   --  3.3   Liver Function Tests: Recent Labs  Lab 01/13/20 0523 01/16/20 0636  AST 27 29  ALT 15 22  ALKPHOS 66 54  BILITOT 0.9 0.9  PROT 6.6 6.3*  ALBUMIN 3.1* 2.7*   No results for input(s): LIPASE, AMYLASE in the last 168 hours. No results for input(s): AMMONIA in the last 168 hours. Cardiac Enzymes: No results for input(s): CKTOTAL, CKMB, CKMBINDEX, TROPONINI in the last 168 hours. BNP (last 3 results) Recent Labs    01/13/20 0523 01/16/20 0636  BNP 277.1* 1,208.3*    ProBNP (last 3 results) No results for input(s): PROBNP in the last 8760 hours.  CBG: Recent Labs  Lab 01/16/20 1623 01/16/20 2135 01/16/20 2315 01/17/20 0615 01/17/20 0727  GLUCAP 149* 84 165* 150* 119*   Recent Results (from the past 240 hour(s))  Urine culture     Status: Abnormal   Collection Time: 01/13/20  5:26 AM   Specimen: In/Out Cath Urine  Result Value Ref Range Status   Specimen Description IN/OUT CATH URINE  Final   Special Requests   Final    NONE Performed at [facility], [place] [address] [place], [place] [address]    Culture >=100,000 COLONIES/mL ESCHERICHIA COLI (A)  Final   Report Status 01/15/2020 FINAL  Final   Organism ID, Bacteria ESCHERICHIA COLI (A)  Final      Susceptibility   Escherichia coli - MIC*    AMPICILLIN 4 SENSITIVE Sensitive     CEFAZOLIN <=4 SENSITIVE Sensitive     CEFTRIAXONE <=0.25 SENSITIVE Sensitive     CIPROFLOXACIN <=0.25 SENSITIVE Sensitive     GENTAMICIN <=1 SENSITIVE Sensitive     IMIPENEM <=0.25 SENSITIVE Sensitive     NITROFURANTOIN <=16 SENSITIVE Sensitive     TRIMETH/SULFA <=20 SENSITIVE Sensitive     AMPICILLIN/SULBACTAM <=2 SENSITIVE Sensitive     PIP/TAZO <=4 SENSITIVE  Sensitive     * >=100,000 COLONIES/mL ESCHERICHIA COLI  Blood Culture (routine x 2)     Status: None (Preliminary result)   Collection Time: 01/13/20  5:26 AM   Specimen: BLOOD RIGHT ARM  Result Value Ref Range Status   Specimen Description BLOOD RIGHT ARM  Final   Special Requests   Final    BOTTLES DRAWN AEROBIC AND ANAEROBIC BLOOD RIGHT ARM   Culture   Final    NO GROWTH 4 DAYS Performed at [facility], [place] [address], [place], [place] [address]    Report Status PENDING  Incomplete  Blood Culture (routine x 2)     Status: None (Preliminary result)   Collection Time: 01/13/20  5:31 AM   Specimen: BLOOD LEFT ARM  Result Value Ref Range Status   Specimen Description BLOOD LEFT ARM  Final   Special Requests   Final    BOTTLES DRAWN AEROBIC AND ANAEROBIC Blood Culture results may not be optimal due to an inadequate volume of blood received in culture bottles   Culture   Final    NO GROWTH 4 DAYS Performed at [facility], [place] [address], [place], [place] [address]    Report Status PENDING  Incomplete  SARS Coronavirus 2 by RT PCR (hospital order, performed in [facility] hospital lab) Nasopharyngeal Nasopharyngeal Swab     Status: None   Collection Time: 01/13/20  6:44 AM   Specimen: Nasopharyngeal Swab  Result Value Ref Range Status   SARS Coronavirus 2 NEGATIVE NEGATIVE Final    Comment: (NOTE) SARS-CoV-2 target nucleic acids are NOT DETECTED.  The SARS-CoV-2 RNA is generally detectable in upper and lower respiratory specimens during the acute phase of infection. The lowest concentration of SARS-CoV-2 viral copies this assay can detect is 250 copies / mL. A negative result does not preclude SARS-CoV-2 infection and should not be used as the sole basis for treatment or other patient management decisions.  A negative result may occur with improper specimen collection / handling, submission of specimen other than nasopharyngeal swab, presence of viral mutation(s)  within the areas targeted by this assay, and inadequate number of viral copies (<250 copies / mL). A negative result must be combined with clinical observations, patient history, and epidemiological information.  Fact Sheet for Patients:   StrictlyIdeas.no  Fact Sheet for Healthcare Providers: BankingDealers.co.za  This test is not yet approved or  cleared by the [place] FDA and has been authorized for detection and/or diagnosis of SARS-CoV-2 by FDA under an Emergency Use Authorization (EUA).  This EUA will remain in effect (meaning this test can be used) for the duration of the COVID-19 declaration under Section 564(b)(1) of the Act, 21 U.S.C. section 360bbb-3(b)(1), unless the authorization is terminated or revoked sooner.  Performed at [facility], [place] [address] [place], [place] [address]   Respiratory Panel by PCR     Status: None   Collection Time: 01/14/20  5:54 PM   Specimen: Nasopharyngeal Swab; Respiratory  Result Value Ref Range Status   Adenovirus NOT DETECTED NOT DETECTED Final   Coronavirus 229E NOT DETECTED NOT DETECTED Final    Comment: (NOTE) The Coronavirus on the Respiratory Panel, DOES NOT test for the novel  Coronavirus (2019 nCoV)    Coronavirus HKU1 NOT DETECTED NOT DETECTED Final   Coronavirus NL63 NOT DETECTED NOT DETECTED Final   Coronavirus OC43 NOT DETECTED NOT DETECTED Final   Metapneumovirus NOT DETECTED NOT DETECTED Final   Rhinovirus / Enterovirus NOT DETECTED NOT DETECTED Final   Influenza A NOT DETECTED NOT DETECTED Final   Influenza B NOT DETECTED NOT DETECTED Final   Parainfluenza Virus  1 NOT DETECTED NOT DETECTED Final   Parainfluenza Virus 2 NOT DETECTED NOT DETECTED Final   Parainfluenza Virus 3 NOT DETECTED NOT DETECTED Final   Parainfluenza Virus 4 NOT DETECTED NOT DETECTED Final   Respiratory Syncytial Virus NOT DETECTED NOT DETECTED Final   Bordetella pertussis NOT  DETECTED NOT DETECTED Final   Chlamydophila pneumoniae NOT DETECTED NOT DETECTED Final   Mycoplasma pneumoniae NOT DETECTED NOT DETECTED Final    Comment: Performed at [facility], [place] [address], [place], [place] [address]     Studies: No results found.    Flora Lipps, MD  [facility] 01/17/2020

## 2020-01-17 NOTE — Evaluation (Signed)
Physical Therapy Evaluation Patient Details Name: Sophia Ward MRN: [id number] DOB: 14-Apr-1933 Today's Date: 01/17/2020   History of Present Illness  86 y.o. female with medical history significant of HTN, HLD, PAF, CAD s/p stent, AS, DM type II, RA on MTX, PMR on chronic prednisone, and spinal stenosis presents with complaints of shortness of breath and cough and fever. EMS found pt to be 68%O2 on RA.  In ED, required 15L norebreather, Chest x-ray showing patchy opacity concerning for atypical pneumonia more so than edema. Admitted 01/13/20 for treatment of respiratory failure with hypoxia, transient hypotension and AKI. In A-fib with RVR 01/15/20    Clinical Impression  PTA pt is living with husband in single story home with level entry. Pt reports independence with ambulation without AD and independent in ADLs/iADLs. Pt is currently limited in safe mobility by increased O2 demand and generalized weakness. PT recommending HHPT at discharge to regain strength and endurance in her home environment. PT will continue to follow acutely.     Follow Up Recommendations Home health PT;Supervision for mobility/OOB    Equipment Recommendations  None recommended by PT       Precautions / Restrictions Precautions Precautions: Fall Restrictions Weight Bearing Restrictions: No      Mobility  Bed Mobility Overal bed mobility: Modified Independent             General bed mobility comments: use of bed rail   Transfers Overall transfer level: Needs assistance Equipment used: None Transfers: Sit to/from Stand Sit to Stand: Min guard Stand pivot transfers: Supervision (s to manage lines)       General transfer comment: min guard to power up into standing  Ambulation/Gait Ambulation/Gait assistance: Min guard Gait Distance (Feet): 5 Feet Assistive device: None Gait Pattern/deviations: Step-through pattern;Shuffle;Trunk flexed Gait velocity: slowed Gait velocity interpretation:  <1.31 ft/sec, indicative of household ambulator General Gait Details: min guard for ambulation to recliner, assist needed for lines and leads      Balance Overall balance assessment: Needs assistance Sitting-balance support: Feet supported;No upper extremity supported Sitting balance-Leahy Scale: Fair     Standing balance support: No upper extremity supported;During functional activity Standing balance-Leahy Scale: Fair                               Pertinent Vitals/Pain Pain Assessment: No/denies pain    Home Living Family/patient expects to be discharged to:: Private residence Living Arrangements: Spouse/significant other Available Help at Discharge: Family Type of Home: House Home Access: Level entry     Home Layout: Able to live on main level with bedroom/bathroom Home Equipment: Clinical cytogeneticist - 2 wheels      Prior Function Level of Independence: Independent         Comments: did not use ad for amb     Hand Dominance   Dominant Hand: Right    Extremity/Trunk Assessment   Upper Extremity Assessment Upper Extremity Assessment: Defer to OT evaluation    Lower Extremity Assessment Lower Extremity Assessment: Generalized weakness       Communication   Communication: No difficulties  Cognition Arousal/Alertness: Awake/alert Behavior During Therapy: WFL for tasks assessed/performed Overall Cognitive Status: Within Functional Limits for tasks assessed                                        General Comments General  comments (skin integrity, edema, etc.): Pt 100%O2 on 10L O2 via HFNC, HR 82-116bpm, BP 104/58, Husband just arrived        Assessment/Plan    PT Assessment Patient needs continued PT services  PT Problem List Decreased strength;Decreased balance;Decreased mobility;Cardiopulmonary status limiting activity       PT Treatment Interventions Gait training;Functional mobility training;Therapeutic  activities;Therapeutic exercise;Balance training;Cognitive remediation;Patient/family education    PT Goals (Current goals can be found in the Care Plan section)  Acute Rehab PT Goals Patient Stated Goal: to go home PT Goal Formulation: With patient/family Time For Goal Achievement: 01/31/20 Potential to Achieve Goals: Good    Frequency Min 3X/week    AM-PAC PT "6 Clicks" Mobility  Outcome Measure Help needed turning from your back to your side while in a flat bed without using bedrails?: None Help needed moving from lying on your back to sitting on the side of a flat bed without using bedrails?: A Little Help needed moving to and from a bed to a chair (including a wheelchair)?: None Help needed standing up from a chair using your arms (e.g., wheelchair or bedside chair)?: None Help needed to walk in hospital room?: None Help needed climbing 3-5 steps with a railing? : A Little 6 Click Score: 22    End of Session Equipment Utilized During Treatment: Gait belt;Oxygen Activity Tolerance: Patient tolerated treatment well Patient left: in chair;with call bell/phone within reach;with chair alarm set;with family/visitor present Nurse Communication: Mobility status PT Visit Diagnosis: Muscle weakness (generalized) (M62.81);Difficulty in walking, not elsewhere classified (R26.2)    Time: 6734-1937 PT Time Calculation (min) (ACUTE ONLY): 25 min   Charges:   PT Evaluation $PT Eval Moderate Complexity: 1 Mod PT Treatments $Gait Training: 8-22 mins        Sophia Ward B. Migdalia Dk PT, DPT Acute Rehabilitation Services Pager [phone] Office [phone]   [place] 01/17/2020, 10:12 AM

## 2020-01-17 NOTE — Evaluation (Signed)
Occupational Therapy Evaluation Patient Details Name: Sophia Ward MRN: [id number] DOB: 05/29/1933 Today's Date: 01/17/2020    History of Present Illness 86 y.o. female with medical history significant of HTN, HLD, PAF, CAD s/p stent, AS, DM type II, RA on MTX, PMR on chronic prednisone, and spinal stenosis presents with complaints of shortness of breath and cough and fever. EMS found pt to be 68%O2 on RA.  In ED, required 15L norebreather, Chest x-ray showing patchy opacity concerning for atypical pneumonia more so than edema. Admitted 01/13/20 for treatment of respiratory failure with hypoxia, transient hypotension and AKI. In A-fib with RVR 01/15/20   Clinical Impression   Pt. Was Mod I with bed mobility for supine to sit and sti to supine. Pt. Was Mod I with sit to stand. Pt. Is S withy AMB in room to manage lines. Pt. Has o2 currently and has multiple iv and vital monitors. Pt. Was able to sit EOB and perform ADLs Mod I level with extra time secondary to wires and probs make it difficult for her to use R hand. Pt. Is an agreement that she does not need further OT at this time. One time evaluation     Follow Up Recommendations  No OT follow up    Equipment Recommendations  None recommended by OT    Recommendations for Other Services       Precautions / Restrictions Precautions Precautions: Fall Restrictions Weight Bearing Restrictions: No      Mobility Bed Mobility Overal bed mobility: Modified Independent                Transfers Overall transfer level: Needs assistance Equipment used: None Transfers: Stand Pivot Transfers   Stand pivot transfers: Supervision (s to manage lines)       General transfer comment: Pt. was able to amb short distrance in room withou lob.     Balance                                           ADL either performed or assessed with clinical judgement   ADL Overall ADL's : Needs  assistance/impaired Eating/Feeding: Independent   Grooming: Modified independent   Upper Body Bathing: Modified independent   Lower Body Bathing: Modified independent   Upper Body Dressing : Modified independent   Lower Body Dressing: Modified independent   Toilet Transfer: Copy Details (indicate cue type and reason): s to manage lines         Functional mobility during ADLs:  (Pt. is Mod I with sit to stand. ) General ADL Comments: Pt. was able to demo ability to perform ADLs Pt. expressed confidence of ability to care for herself.      Vision Baseline Vision/History: Wears glasses Wears Glasses: At all times Patient Visual Report: No change from baseline       Perception     Praxis      Pertinent Vitals/Pain Pain Assessment: No/denies pain     Hand Dominance Right   Extremity/Trunk Assessment Upper Extremity Assessment Upper Extremity Assessment: Overall WFL for tasks assessed           Communication Communication Communication: No difficulties   Cognition Arousal/Alertness: Awake/alert Behavior During Therapy: WFL for tasks assessed/performed Overall Cognitive Status: Within Functional Limits for tasks assessed  General Comments       Exercises     Shoulder Instructions      Home Living Family/patient expects to be discharged to:: Private residence Living Arrangements: Spouse/significant other Available Help at Discharge: Family Type of Home: House Home Access: Level entry     Home Layout: Able to live on main level with bedroom/bathroom     Bathroom Shower/Tub: Occupational psychologist: Handicapped height Bathroom Accessibility: Yes How Accessible: Accessible via walker Home Equipment: Clinical cytogeneticist - 2 wheels          Prior Functioning/Environment Level of Independence: Independent        Comments: did not use ad for amb         OT Problem List:        OT Treatment/Interventions:      OT Goals(Current goals can be found in the care plan section) Acute Rehab OT Goals Patient Stated Goal: to go home  OT Frequency:     Barriers to D/C:            Co-evaluation              AM-PAC OT "6 Clicks" Daily Activity     Outcome Measure Help from another person eating meals?: None Help from another person taking care of personal grooming?: None Help from another person toileting, which includes using toliet, bedpan, or urinal?: A Little Help from another person bathing (including washing, rinsing, drying)?: None Help from another person to put on and taking off regular upper body clothing?: None Help from another person to put on and taking off regular lower body clothing?: None 6 Click Score: 23   End of Session Equipment Utilized During Treatment: Oxygen  Activity Tolerance: Patient tolerated treatment well Patient left: in bed;with call bell/phone within reach;with bed alarm set                   Time: 7096-2836 OT Time Calculation (min): 32 min Charges:  OT General Charges $OT Visit: 1 Visit OT Evaluation $OT Eval Low Complexity: 1 Low OT Treatments $Self Care/Home Management : 8-22 mins  Reece Packer OT/L  Colena Ketterman 01/17/2020, 9:08 AM

## 2020-01-17 NOTE — Progress Notes (Addendum)
Progress Note  Patient Name: Sophia Ward Date of Encounter: 01/17/2020  Primary Cardiologist: Larae Grooms, MD   Subjective   Feels better this morning - increased energy. No significant change in breathing compared to yesterday. Unaware of her atrial fibrillation. No complaints of chest pain  Inpatient Medications    Scheduled Meds: . acetaminophen  650 mg Oral QHS  . apixaban  5 mg Oral BID  . atorvastatin  40 mg Oral QHS  . Chlorhexidine Gluconate Cloth  6 each Topical Daily  . escitalopram  5 mg Oral Daily  . folic acid  2 mg Oral Daily  . furosemide  20 mg Intravenous Once  . guaiFENesin  600 mg Oral BID  . insulin aspart  0-15 Units Subcutaneous TID WC  . insulin aspart  0-5 Units Subcutaneous QHS  . insulin aspart  6 Units Subcutaneous TID with meals  . insulin detemir  14 Units Subcutaneous q morning - 10a  . methotrexate  12.5 mg Oral Q Wed  . metoprolol succinate  100 mg Oral q morning - 10a  . pantoprazole  40 mg Oral QPC lunch  . predniSONE  4 mg Oral Q breakfast  . sodium chloride flush  3 mL Intravenous Q12H   Continuous Infusions: . sodium chloride 250 mL (01/15/20 0653)  . azithromycin 500 mg (01/17/20 0752)  . cefTRIAXone (ROCEPHIN)  IV 1 g (01/17/20 0621)   PRN Meds: sodium chloride, acetaminophen **OR** [DISCONTINUED] acetaminophen, estradiol, ipratropium-albuterol, ondansetron **OR** ondansetron (ZOFRAN) IV, sodium chloride flush, zolpidem   Vital Signs    Vitals:   01/17/20 0600 01/17/20 0839 01/17/20 0852 01/17/20 0900  BP: 107/79 96/71    Pulse:  100 (!) 108 100  Resp:  18  18  Temp: 98.4 F (36.9 C) (!) 97.4 F (36.3 C)    TempSrc: Oral Oral    SpO2:  98% 98%   Weight: 77.4 kg     Height:        Intake/Output Summary (Last 24 hours) at 01/17/2020 1122 Last data filed at 01/17/2020 0846 Gross per 24 hour  Intake 1040 ml  Output 2325 ml  Net -1285 ml   Filed Weights   01/15/20 0044 01/16/20 0019 01/17/20 0600  Weight:  78.3 kg 78.9 kg 77.4 kg    Telemetry    Atrial fibrillation with HR in the 80s-110s - Personally Reviewed  ECG    No new tracings - Personally Reviewed  Physical Exam   GEN: Sitting in bedside chair in no acute distress.   Neck: No JVD, no carotid bruits Cardiac: IRIR, no murmurs, rubs, or gallops.  Respiratory: Clear to auscultation bilaterally, no wheezes/ rales/ rhonchi GI: NABS, Soft, nontender, non-distended  MS: No edema; No deformity. Neuro:  Nonfocal, moving all extremities spontaneously Psych: Normal affect   Labs    Chemistry Recent Labs  Lab 01/13/20 0523 01/13/20 0523 01/14/20 0403 01/15/20 0854 01/16/20 0636  NA 139   < > 138 137 140  K 3.2*   < > 4.7 3.7 3.9  CL 105   < > 104 105 106  CO2 16*   < > 23 22 24   GLUCOSE 226*   < > 232* 207* 99  BUN 18   < > 20 21 19   CREATININE 1.36*   < > 1.06* 0.99 1.10*  CALCIUM 8.8*   < > 8.5* 8.4* 8.9  PROT 6.6  --   --   --  6.3*  ALBUMIN 3.1*  --   --   --  2.7*  AST 27  --   --   --  29  ALT 15  --   --   --  22  ALKPHOS 66  --   --   --  54  BILITOT 0.9  --   --   --  0.9  GFRNONAA 36*   < > 48* 52* 46*  GFRAA 41*   < > 56* >60 53*  ANIONGAP 18*   < > 11 10 10    < > = values in this interval not displayed.     Hematology Recent Labs  Lab 01/14/20 0403 01/15/20 0854 01/16/20 0636  WBC 22.6* 20.3* 16.4*  RBC 3.12* 3.22* 3.53*  HGB 7.8* 8.0* 9.0*  HCT 27.3* 27.7* 30.3*  MCV 87.5 86.0 85.8  MCH 25.0* 24.8* 25.5*  MCHC 28.6* 28.9* 29.7*  RDW 22.1* 22.2* 22.0*  PLT 344 408* 500*    Cardiac EnzymesNo results for input(s): TROPONINI in the last 168 hours. No results for input(s): TROPIPOC in the last 168 hours.   BNP Recent Labs  Lab 01/13/20 0523 01/16/20 0636  BNP 277.1* 1,208.3*     DDimer No results for input(s): DDIMER in the last 168 hours.   Radiology    No results found.  Cardiac Studies   Echocardiogram 10/24/19: 1. Left ventricular ejection fraction, by estimation, is 60 to  65%. The  left ventricle has normal function. The left ventricle has no regional  wall motion abnormalities. Left ventricular diastolic function could not  be evaluated. Elevated left  ventricular end-diastolic pressure.  2. Right ventricular systolic function is normal. The right ventricular  size is normal. There is moderately elevated pulmonary artery systolic  pressure.  3. Left atrial size was mildly dilated.  4. The mitral valve is abnormal. Trivial mitral valve regurgitation.  5. The aortic valve is tricuspid. Aortic valve regurgitation is trivial.  Severe aortic valve stenosis. Aortic valve area, by VTI measures 0.76 cm.  Aortic valve mean gradient measures 32.0 mmHg. Aortic valve Vmax measures  3.77 m/s.  6. The inferior vena cava is dilated in size with <50% respiratory  variability, suggesting right atrial pressure of 15 mmHg.   Patient Profile     Sophia Ward a 86 y.o.femalewith a historyof CAD with prior stenting to LAD, severe aortic stenosis, paroxysmal atrial fibrillation on Eliquis, hypertension, hyperlipidemia, diabetes mellitus, chronic anemia, spinal stenosis with chronic back pain, rheumatoid arthritis on Methotrexate, polymalgia rheumatica on chronic Prednisone, GERD, and vertigowho is being seen for the evaluation of atrial fibrillation with RVRat the request of Dr. Louanne Belton.  Patient admitted with pneumonia and cardiology asked to evaluate for atrial fibrillation.  Echocardiogram May 2021 showed normal LV function, mild left atrial enlargement, trace aortic and mitral regurgitation and severe aortic stenosis (mean gradient 32 mmHg, aortic valve area of 0.76 cm).  Plan previously was consideration of cardiac catheterization in anticipation of aortic valve replacement.  Assessment & Plan    1. Paroxysmal atrial fibrillation: patient remains in Afib today but is asymptomatic. Suspect this is driven by her PNA. HR still up to 110s at times on metoprolol  for rate control - hopeful this will continue to improve as she recovers from her PNA.  - Continue metoprolol for rate control - Continue apixaban for stroke ppx - Could consider an outpatient DCCV should her atrial fibrillation persist after recovery of her PNA  2. Volume overload: suspect this is contributing to her ongoing O2 demands. She received IV lasix 20mg   x1 dose yesterday with UOP at lease net -1.7L in the past 24 hours with improvement in O2. Weight is down 3lbs. Still with some crackles at lung bases - Will give additional IV lasix 20mg  x1 dose today and monitor for response - Continue to monitor strict I&Os and daily weights - Continue to monitor electrolytes and replete as needed to maintain K >4, Mg >2  3. PNA: On IV antibiotics. Seen by PCCM who recommended ongoing antibiotics and diuresis with plans to wean FiO2 as tolerated.  - Continue management per primary team and PCCM  4. Aortic stenosis: severe on echo 09/2019. Planning for outpatient aortic valve replacement work-up.  - Anticipate outpatient LHC once recovered from her PNA.   5. CAD s/p PCI to LAD: no chest pain complaints. Not on aspirin given need for apixaban - Continue statin and metoprolol - Anticipate outpatient LHC once recovered from her PNA for #4  For questions or updates, please contact [facility] Please consult [email or web address] for contact info under Cardiology/STEMI.   Signed, Abigail Butts, PA-C  01/17/2020, 11:22 AM   [phone]  The patient was seen, examined and discussed with Abigail Butts, PA-C  and I agree with the above.   Patient is in atrial fibrillation with ventricular rates 90-1 05, she remains fluid overloaded, I would reinitiate Lasix at 40 mg IV daily for next 2 days and follow-up closely, I anticipate that her heart rate will improve with therapy of her underlying pneumonia.  Ena Dawley, MD 01/17/2020

## 2020-01-17 NOTE — Consult Note (Addendum)
Name: Sophia Ward MRN: [id number] DOB: May 28, 1933    ADMISSION DATE:  01/13/2020 CONSULTATION DATE: 01/17/2019  REFERRING MD : [facility]  CHIEF COMPLAINT: Fevers, chills, nausea, shortness of breath  BRIEF PATIENT DESCRIPTION:  86 year old female no acute distress currently on 10 L nasal cannula  SIGNIFICANT EVENTS    STUDIES:    HISTORY OF PRESENT ILLNESS:   86 year old female with extensive past medical history mainly of cardiac issues without pulmonary issues.  She does normally check her O2 saturations on a daily basis and runs 93% when she arouses up to 97% with activity.  She is admitted by the [facility] hospitalist service on 01/13/2020 after noting cough increasing shortness of breath small amount of nausea without evidence of aspiration was noted to be hypoxic on admission to the hospital.  She is treated with antimicrobial therapy oxygen therapy.  She did receive Lasix 24 hours previous to this exam and reports feeling much better on 01/17/2020.  She is currently on 10 L nasal cannula sats of 97%.  She is at -2 L for 24 hours.  Reports decreased lower extremity edema.  Pulmonary critical care asked to evaluate for reportedly being refractory to current interventions.  She does not normally see a pulmonary physician.  She does have an extensive past medical history and 2D echo be remarkable for mitral valve regurgitation aortic valve regurgitation and aortic stenosis.  She does have atrial fibrillation and is on Eliquis for this. Agree with antimicrobial therapy.  Agree with diuresis.  May give consideration to cardiology evaluation unless she just continues to improve with diuresis.  She denies wheezing.  She was positive for fever of 99.4 along with some chills that he says has resolved since admission. Past medical history is remarkable for coronary artery disease, diabetes mellitus without complication, essential hypertension benign, gastroesophageal reflux disease, hypertension,  myocardial infarction with stent placement.  Rheumatoid arthritis treated with methotrexate.  PAST MEDICAL HISTORY :   has a past medical history of Allergic rhinitis, Anemia (09/2019), Arthritis, BPPV (benign paroxysmal positional vertigo), Coronary artery disease, Coronary atherosclerosis of native coronary artery (01/30/2014), Diabetes mellitus without complication ([place]), Essential hypertension, benign (01/30/2014), GERD (gastroesophageal reflux disease), Hypertension, Mixed hyperlipidemia (01/30/2014), Myocardial infarction ([place]) (2004), Osteopenia, Post-menopausal, Rheumatoid arthritis ([place]), Spinal stenosis, and Stenosing tenosynovitis.  has a past surgical history that includes Cardiac catheterization (2004); Coronary angioplasty (2004); Tonsillectomy; Abdominal hysterectomy; left shouler surgery; Appendectomy; Eye surgery; Dilation and curettage of uterus; carpel tunnel; Colonoscopy with propofol (N/A, 10/30/2012); Esophagogastroduodenoscopy (egd) with propofol (N/A, 10/30/2012); Eye surgery; Repair of complex traction retinal detachment (Left, 02/12/2019); Photocoagulation with laser (Left, 02/12/2019); Vitrectomy 25 gauge with scleral buckle (Left, 02/12/2019); Injection of silicone oil (Left, 0/37/0488); Esophagogastroduodenoscopy (N/A, 10/25/2019); biopsy (10/25/2019); Colonoscopy with propofol (N/A, 10/26/2019); and biopsy (10/26/2019). Prior to Admission medications   Medication Sig Start Date End Date Taking? Authorizing Provider  acetaminophen (TYLENOL) 650 MG CR tablet Take 1,300 mg by mouth at bedtime.   Yes [provider]  amLODipine (NORVASC) 10 MG tablet Take 1 tablet (10 mg total) by mouth every morning. 11/11/19  Yes Jettie Booze, MD  apixaban (ELIQUIS) 5 MG TABS tablet Take 1 tablet (5 mg total) by mouth 2 (two) times daily. 11/11/19  Yes Jettie Booze, MD  atorvastatin (LIPITOR) 40 MG tablet Take 1 tablet (40 mg total) by mouth at bedtime. 04/30/19  Yes Jettie Booze,  MD  Calcium Carbonate-Vitamin D (CALCIUM + D PO) Take 1 tablet by mouth every morning.  Yes [provider]  Cholecalciferol (VITAMIN D3) 125 MCG (5000 UT) CAPS Take 5,000 Units by mouth daily.    Yes [provider]  escitalopram (LEXAPRO) 5 MG tablet Take 5 mg by mouth daily. 12/29/19  Yes [provider]  estradiol (ESTRACE) 0.1 MG/GM vaginal cream Place 1 Applicatorful vaginally daily as needed (driness).  08/21/19  Yes [provider]  folic acid (FOLVITE) 1 MG tablet Take 2 tablets (2 mg total) by mouth daily. 09/30/19  Yes Ofilia Neas, PA-C  insulin lispro (HUMALOG KWIKPEN) 100 UNIT/ML KiwkPen Inject 6 Units into the skin 3 (three) times daily.    Yes [provider]  LEVEMIR FLEXTOUCH 100 UNIT/ML Pen Inject 14 Units into the skin every morning.  08/25/16  Yes [provider]  methotrexate 2.5 MG tablet TAKE 5 TABLETS BY MOUTH ONCE A WEEK. Caution:Chemotherapy. Protect from light. Patient taking differently: Take 12.5 mg by mouth every Wednesday.  09/30/19  Yes Ofilia Neas, PA-C  metoprolol succinate (TOPROL-XL) 100 MG 24 hr tablet Take 1 tablet (100 mg total) by mouth every morning. 11/28/19  Yes Jettie Booze, MD  nitroGLYCERIN (NITROSTAT) 0.4 MG SL tablet DISSOLVE ONE TABLET UNDER THE TONGUE EVERY 5 MINUTES AS NEEDED FOR CHEST PAIN.  DO NOT EXCEED A TOTAL OF 3 DOSES IN 15 MINUTES 11/11/19  Yes Jettie Booze, MD  Omega-3 Fatty Acids (FISH OIL PO) Take 1 capsule by mouth every morning.   Yes [provider]  pantoprazole (PROTONIX) 40 MG tablet Take 1 tablet (40 mg total) by mouth daily after lunch. 10/27/19  Yes Bonnielee Haff, MD  predniSONE (DELTASONE) 1 MG tablet Take 4 tablets (4 mg total) by mouth daily with breakfast. 11/05/19  Yes Deveshwar, Abel Presto, MD  ramipril (ALTACE) 10 MG tablet Take 10 mg by mouth 2 (two) times daily.    Yes [provider]  B-D ULTRAFINE III SHORT PEN 31G X 8 MM MISC 1 each by Other  route in the morning, at noon, in the evening, and at bedtime.  07/29/19   [provider]   Allergies  Allergen Reactions  . Codeine Nausea And Vomiting    MAKES HER SICK  . Glimepiride Other (See Comments)    Can't remember  . Jardiance [Empagliflozin] Other (See Comments)    Yeast  . Metformin And Related Diarrhea  . Penicillins Rash    FAMILY HISTORY:  family history includes Arthritis in her daughter; CAD in her father and mother; Heart Problems in her brother; Heart attack in her mother. SOCIAL HISTORY:  reports that she has never smoked. She has never used smokeless tobacco. She reports that she does not drink alcohol and does not use drugs.  REVIEW OF SYSTEMS:   10 point review of system taken, please see HPI for positives and negatives.   SUBJECTIVE:  Awake alert no acute distress at rest reports feeling better today VITAL SIGNS: Temp:  [97.4 F (36.3 C)-98.8 F (37.1 C)] 97.4 F (36.3 C) (08/20 0839) Pulse Rate:  [60-108] 100 (08/20 0900) Resp:  [16-19] 18 (08/20 0900) BP: (96-119)/(53-79) 96/71 (08/20 0839) SpO2:  [92 %-99 %] 98 % (08/20 0852) Weight:  [77.4 kg] 77.4 kg (08/20 0600)  PHYSICAL EXAMINATION: General: Obese female who is no acute distress at rest.  Able to carry on conversations without shortness of breath. Neuro: Grossly intact without focal defect HEENT: No JVD or lymphadenopathy is appreciated. Cardiovascular: Heart sounds are distant Lungs: Diminished throughout with some mild rhonchi  Abdomen: Obese soft nontender positive bowel sounds Musculoskeletal: Grossly intact multiple areas of ecchymosis Skin: Warm and dry  Recent Labs  Lab 01/14/20 0403 01/15/20 0854 01/16/20 0636  NA 138 137 140  K 4.7 3.7 3.9  CL 104 105 106  CO2 23 22 24   BUN 20 21 19   CREATININE 1.06* 0.99 1.10*  GLUCOSE 232* 207* 99   Recent Labs  Lab 01/14/20 0403 01/15/20 0854 01/16/20 0636  HGB 7.8* 8.0* 9.0*  HCT 27.3* 27.7* 30.3*  WBC 22.6* 20.3*  16.4*  PLT 344 408* 500*   No results found.  ASSESSMENT: Principal Problem:   Sepsis due to pneumonia [facility]) Active Problems:   Obesity, unspecified   Rheumatoid arthritis involving multiple sites with positive rheumatoid factor (HCC)   Elevated sed rate   Polymyalgia rheumatica (HCC)   Iron deficiency anemia   Transient hypotension   Acute respiratory failure with hypoxia (HCC)   Pressure injury of skin   Discussion: 86 year old female with extensive past medical history mainly of cardiac issues without pulmonary issues.  She does normally check her O2 saturations on a daily basis and runs 93% when she arouses up to 97% with activity.  She is admitted by the [facility] hospitalist service on 01/13/2020 after noting cough increasing shortness of breath small amount of nausea without evidence of aspiration was noted to be hypoxic on admission to the hospital.  She is treated with antimicrobial therapy oxygen therapy.  She did receive Lasix 24 hours previous to this exam and reports feeling much better on 01/17/2020.  She is currently on 10 L nasal cannula sats of 97%.  She is at -2 L for 24 hours.  Reports decreased lower extremity edema.  Pulmonary critical care asked to evaluate for reportedly being refractory to current interventions.  She does not normally see a pulmonary physician.  She does have an extensive past medical history and 2D echo be remarkable for mitral valve regurgitation aortic valve regurgitation and aortic stenosis.  She does have atrial fibrillation and is on Eliquis for this. Agree with antimicrobial therapy.  Agree with diuresis.  May give consideration to cardiology evaluation unless she just continues to improve with diuresis.  She denies wheezing.  She was positive for fever of 99.4 along with some chills that he says has resolved since admission. Past medical history is remarkable for coronary artery disease, diabetes mellitus without complication, essential hypertension  benign, gastroesophageal reflux disease, hypertension, myocardial infarction with stent placement.  Rheumatoid arthritis treated with methotrexate.    PLAN: Wean FiO2 as tolerated Agree with antimicrobial therapy Agree with diuresis note she is a -2 L for the last 24 hours with improved breathing. Continue methotrexate Consider stopping Ambien nocturnally as this may impede her nocturnal ventilation She is currently on prednisone 4 mg daily with breakfast could give consideration to both in this up for short taper. Consider CT of the chest for baseline Consider PFTs when better establish pulmonary baseline Consider follow-up with pulmonary as an outpatient.   Richardson Landry Minor ACNP Acute Care Nurse Practitioner [place] Please consult [place] 01/17/2020, 10:08 AM   Pulmonary critical care attending:  This is an 86 year old female, past medical history of hypertension, coronary artery disease, rheumatoid arthritis on methotrexate.  Patient presents to the hospital with complaints of shortness of breath.,  Has hypertension, hyperlipidemia atrial fibrillation, aortic stenosis, type 2 diabetes, polymyalgia rheumatica on chronic prednisone.  She was hypoxemic with bilateral pulmonary opacities.  Pulmonary was consulted for ongoing  hypoxemia.  BP (!) 102/59 (BP Location: Right Arm)   Pulse 98   Temp 97.9 F (36.6 C) (Oral)   Resp 18   Ht 5\' 2"  (1.575 m)   Wt 77.4 kg   SpO2 98%   BMI 31.22 kg/m   General: Elderly female resting in bed no distress HEENT: NCAT, sclera clear tracking appropriately Heart: Irregularly irregular, W5-I6, systolic murmur lungs: No significant crackles or wheezing on exam.  Labs: Reviewed, white blood cell count 14.7, platelets 522, hemoglobin 9, serum creatinine 1.09 Chest x-ray: Enlarged cardiac silhouette, bilateral vascular congestion persistent pulmonary opacities. The patient's images have been independently reviewed by me.    RVP  panel negative  Urine culture positive for E. coli  Assessment: Acute hypoxemic respiratory failure requiring nasal cannula O2 supplementation, sepsis, bilateral pulmonary infiltrates concerning for pneumonia. Patient does have a longstanding history of methotrexate use a history of aortic stenosis and atrial fibrillation. Currently being treated for pneumonia with bilateral infiltrates she does have a leukocytosis and lactic acidosis on admission.  Cultures at this point have been negative.  She remains afebrile. Thrombocytosis, likely reactive secondary to above Atrial fibrillation, on Eliquis E. coli UTI, sepsis secondary to AKI, improving Rheumatoid arthritis and polymyalgia rheumatica on chronic immunosuppressive therapy, methotrexate and steroids.  Plan: She appears to be improving with IV Lasix Her initial BNP was also greater than 1200. She is also improving with antimicrobials. Patient is at risk for atypical infections but would expect the patient to continue to decline as she is not getting treated for diseases such as PCP which may occur on immunosuppressed patients Since she is improving I would continue the IV diuresis Would complete a course antibiotics for CAP as well as coverage for E. coli UTI  Please call if there are any questions or concerns.  We will follow along with you.  [facility] 01/17/2020 6:03 PM

## 2020-01-18 LAB — GLUCOSE, CAPILLARY
Glucose-Capillary: 99 mg/dL (ref 70–99)
Glucose-Capillary: 215 mg/dL — ABNORMAL HIGH (ref 70–99)
Glucose-Capillary: 96 mg/dL (ref 70–99)
Glucose-Capillary: 181 mg/dL — ABNORMAL HIGH (ref 70–99)

## 2020-01-18 LAB — CBC
HCT: 27.5 % — ABNORMAL LOW (ref 36.0–46.0)
Hemoglobin: 8.2 g/dL — ABNORMAL LOW (ref 12.0–15.0)
MCH: 25.4 pg — ABNORMAL LOW (ref 26.0–34.0)
MCHC: 29.8 g/dL — ABNORMAL LOW (ref 30.0–36.0)
MCV: 85.1 fL (ref 80.0–100.0)
Platelets: 492 10*3/uL — ABNORMAL HIGH (ref 150–400)
RBC: 3.23 MIL/uL — ABNORMAL LOW (ref 3.87–5.11)
RDW: 21.6 % — ABNORMAL HIGH (ref 11.5–15.5)
WBC: 14 10*3/uL — ABNORMAL HIGH (ref 4.0–10.5)
nRBC: 0 % (ref 0.0–0.2)

## 2020-01-18 LAB — CULTURE, BLOOD (ROUTINE X 2)
Culture: NO GROWTH
Culture: NO GROWTH

## 2020-01-18 LAB — BASIC METABOLIC PANEL
Anion gap: 9 (ref 5–15)
BUN: 21 mg/dL (ref 8–23)
CO2: 26 mmol/L (ref 22–32)
Calcium: 8.8 mg/dL — ABNORMAL LOW (ref 8.9–10.3)
Chloride: 104 mmol/L (ref 98–111)
Creatinine, Ser: 1.13 mg/dL — ABNORMAL HIGH (ref 0.44–1.00)
GFR calc Af Amer: 52 mL/min — ABNORMAL LOW (ref >60)
GFR calc non Af Amer: 45 mL/min — ABNORMAL LOW (ref >60)
Glucose, Bld: 96 mg/dL (ref 70–99)
Potassium: 4 mmol/L (ref 3.5–5.1)
Sodium: 139 mmol/L (ref 135–145)

## 2020-01-18 LAB — MAGNESIUM: Magnesium: 1.8 mg/dL (ref 1.7–2.4)

## 2020-01-18 MED ORDER — MAGNESIUM SULFATE 2 GM/50ML IV SOLN
2.0000 g | Freq: Once | INTRAVENOUS | Status: AC
  Administered 2020-01-18: 2 g via INTRAVENOUS

## 2020-01-18 NOTE — Progress Notes (Signed)
Progress Note  Patient Name: Sophia Ward Date of Encounter: 01/18/2020  Primary Cardiologist: Larae Grooms, MD   Subjective   The patient feels significantly better this morning.  Improved shortness of breath.  Inpatient Medications    Scheduled Meds: . acetaminophen  650 mg Oral QHS  . apixaban  5 mg Oral BID  . atorvastatin  40 mg Oral QHS  . Chlorhexidine Gluconate Cloth  6 each Topical Daily  . escitalopram  5 mg Oral Daily  . folic acid  2 mg Oral Daily  . furosemide  40 mg Intravenous Daily  . guaiFENesin  600 mg Oral BID  . insulin aspart  0-15 Units Subcutaneous TID WC  . insulin aspart  0-5 Units Subcutaneous QHS  . insulin aspart  6 Units Subcutaneous TID with meals  . insulin detemir  14 Units Subcutaneous q morning - 10a  . magnesium oxide  400 mg Oral BID  . methotrexate  12.5 mg Oral Q Wed  . metoprolol succinate  100 mg Oral q morning - 10a  . pantoprazole  40 mg Oral QPC lunch  . predniSONE  4 mg Oral Q breakfast  . sodium chloride flush  3 mL Intravenous Q12H   Continuous Infusions: . sodium chloride 250 mL (01/18/20 0722)  . cefTRIAXone (ROCEPHIN)  IV 1 g (01/18/20 0726)   PRN Meds: sodium chloride, acetaminophen **OR** [DISCONTINUED] acetaminophen, estradiol, ipratropium-albuterol, ondansetron **OR** ondansetron (ZOFRAN) IV, sodium chloride flush, zolpidem   Vital Signs    Vitals:   01/17/20 2123 01/18/20 0054 01/18/20 0127 01/18/20 0338  BP: (!) 103/47 (!) 119/51  (!) 127/56  Pulse: 69 65 63 67  Resp: 19 19 15 19   Temp: 98.9 F (37.2 C) 98.2 F (36.8 C)  (!) 97.3 F (36.3 C)  TempSrc: Oral Oral  Oral  SpO2: 95% 99% 99% 100%  Weight:    76.9 kg  Height:        Intake/Output Summary (Last 24 hours) at 01/18/2020 0856 Last data filed at 01/18/2020 0338 Gross per 24 hour  Intake 900.91 ml  Output 2700 ml  Net -1799.09 ml   Filed Weights   01/16/20 0019 01/17/20 0600 01/18/20 0338  Weight: 78.9 kg 77.4 kg 76.9 kg     Telemetry    Sinus rhythm in 60s- Personally Reviewed  ECG    No new tracings - Personally Reviewed  Physical Exam   GEN:  Not in acute distress.   Neck: No JVD, no carotid bruits Cardiac: RRR, 5 out of 6 systolic murmur, rubs, or gallops.  Respiratory: Clear to auscultation bilaterally, no wheezes/ rales/ rhonchi GI: NABS, Soft, nontender, non-distended  MS: No edema; No deformity. Neuro:  Nonfocal, moving all extremities spontaneously Psych: Normal affect   Labs    Chemistry Recent Labs  Lab 01/13/20 0523 01/14/20 0403 01/16/20 0636 01/17/20 1057 01/18/20 0248  NA 139   < > 140 139 139  K 3.2*   < > 3.9 3.5 4.0  CL 105   < > 106 104 104  CO2 16*   < > 24 22 26   GLUCOSE 226*   < > 99 133* 96  BUN 18   < > 19 17 21   CREATININE 1.36*   < > 1.10* 1.09* 1.13*  CALCIUM 8.8*   < > 8.9 8.8* 8.8*  PROT 6.6  --  6.3* 6.2*  --   ALBUMIN 3.1*  --  2.7* 2.6*  --   AST 27  --  29 23  --   ALT 15  --  22 20  --   ALKPHOS 66  --  54 51  --   BILITOT 0.9  --  0.9 0.5  --   GFRNONAA 36*   < > 46* 47* 45*  GFRAA 41*   < > 53* 54* 52*  ANIONGAP 18*   < > 10 13 9    < > = values in this interval not displayed.     Hematology Recent Labs  Lab 01/16/20 0636 01/17/20 1057 01/18/20 0248  WBC 16.4* 14.7* 14.0*  RBC 3.53* 3.66* 3.23*  HGB 9.0* 9.0* 8.2*  HCT 30.3* 31.0* 27.5*  MCV 85.8 84.7 85.1  MCH 25.5* 24.6* 25.4*  MCHC 29.7* 29.0* 29.8*  RDW 22.0* 22.0* 21.6*  PLT 500* 522* 492*    Cardiac EnzymesNo results for input(s): TROPONINI in the last 168 hours. No results for input(s): TROPIPOC in the last 168 hours.   BNP Recent Labs  Lab 01/13/20 0523 01/16/20 0636  BNP 277.1* 1,208.3*     DDimer No results for input(s): DDIMER in the last 168 hours.   Radiology    DG CHEST PORT 1 VIEW  Result Date: 01/17/2020 CLINICAL DATA:  Shortness of breath and fever EXAM: PORTABLE CHEST 1 VIEW COMPARISON:  01/13/2020, 10/27/2019, 11/07/2016 FINDINGS: Cardiomegaly  with mild central vascular congestion. Probable small left effusion. Streaky airspace opacity at the left base. Aortic atherosclerosis. No pneumothorax. Patchy peripheral opacities are slightly improved. IMPRESSION: 1. Cardiomegaly with mild central congestion. 2. Probable small left effusion. 3. Partial resolution of streaky and patchy left greater than right pulmonary opacities, possible bilateral pneumonia Electronically Signed   By: Donavan Foil M.D.   On: 01/17/2020 17:07    Cardiac Studies   Echocardiogram 10/24/19: 1. Left ventricular ejection fraction, by estimation, is 60 to 65%. The  left ventricle has normal function. The left ventricle has no regional  wall motion abnormalities. Left ventricular diastolic function could not  be evaluated. Elevated left  ventricular end-diastolic pressure.  2. Right ventricular systolic function is normal. The right ventricular  size is normal. There is moderately elevated pulmonary artery systolic  pressure.  3. Left atrial size was mildly dilated.  4. The mitral valve is abnormal. Trivial mitral valve regurgitation.  5. The aortic valve is tricuspid. Aortic valve regurgitation is trivial.  Severe aortic valve stenosis. Aortic valve area, by VTI measures 0.76 cm.  Aortic valve mean gradient measures 32.0 mmHg. Aortic valve Vmax measures  3.77 m/s.  6. The inferior vena cava is dilated in size with <50% respiratory  variability, suggesting right atrial pressure of 15 mmHg.   Patient Profile     CHRISTYN GUTKOWSKI a 86 y.o.femalewith a historyof CAD with prior stenting to LAD, severe aortic stenosis, paroxysmal atrial fibrillation on Eliquis, hypertension, hyperlipidemia, diabetes mellitus, chronic anemia, spinal stenosis with chronic back pain, rheumatoid arthritis on Methotrexate, polymalgia rheumatica on chronic Prednisone, GERD, and vertigowho is being seen for the evaluation of atrial fibrillation with RVRat the request of Dr.  Louanne Belton.  Patient admitted with pneumonia and cardiology asked to evaluate for atrial fibrillation.  Echocardiogram May 2021 showed normal LV function, mild left atrial enlargement, trace aortic and mitral regurgitation and severe aortic stenosis (mean gradient 32 mmHg, aortic valve area of 0.76 cm).  Plan previously was consideration of cardiac catheterization in anticipation of aortic valve replacement.  Assessment & Plan    1. Paroxysmal atrial fibrillation:  -The patient cardioverted to sinus  rhythm at 9 PM last night, her rates are in 60s to 70s right now.  Significant symptomatic improvement.   - Continue metoprolol - Continue apixaban for stroke ppx  2.  Acute diastolic CHF: - -Diuresis overnight, -1.8 L and down to 76.9 kg  -Potassium 4.0, creatinine 1.1  3. PNA: On IV antibiotics. Seen by PCCM who recommended ongoing antibiotics and diuresis with plans to wean FiO2 as tolerated.  - Continue management per primary team and PCCM  4. Aortic stenosis: severe on echo 09/2019. Planning for outpatient aortic valve replacement work-up.  - Anticipate outpatient LHC once recovered from her PNA.   5. CAD s/p PCI to LAD: no chest pain complaints. Not on aspirin given need for apixaban - Continue statin and metoprolol - Anticipate outpatient LHC once recovered from her PNA for #4  For questions or updates, please contact [facility] Please consult [email or web address] for contact info under Cardiology/STEMI.   Signed, Ena Dawley, MD  01/18/2020, 8:56 AM   [phone]

## 2020-01-18 NOTE — Progress Notes (Addendum)
PROGRESS NOTE    Sophia Ward  FXT:[id number] DOB: 1932/12/09 DOA: 01/13/2020 PCP: [name], MD    Brief Narrative:  Patient admitted to the hospital with working diagnosis of acute hypoxic respiratory failure due to pneumonia complicated by sepsis.  86 year old female with past medical history for hypertension, dyslipidemia, coronary disease, aortic stenosis, paroxysmal atrial fibrillation, type 2 diabetes mellitus and rheumatoid arthritis who presented with cough and dyspnea.  She reported 3 days of shortness of breath, associated with wheezing, fever, chills, generalized malaise, nausea, vomiting and diarrhea.  On her initial physical examination respiratory rate 19-28, blood pressure 94/46, positive hypoxemia 68% requiring 15 L of nonrebreather to achieve cure 92%.  Dry mucous membranes, positive increased work of breathing, heart S1-S2, present, tachycardic, abdomen soft, positive trace lower extremity edema Sodium 139, potassium 3.2, chloride 105, bicarb 16, glucose 226, BUN 18, creatinine 1.36, white count 18.1, hemoglobin 9.1, hematocrit 31.8, platelets 435.  Lactate 3.6 SARS COVID-19 negative.  Chest radiograph with increased lung markings, positive retrocardiac infiltrate at the left base.  EKG 97 bpm, normal axis, normal intervals, sinus rhythm, ST depressions in lead II, lead III, aVF, V4-V6, no significant T wave changes.  Patient has been placed on antibiotic therapy and diuresis, slowly improving oxygenation.  Assessment & Plan:   Principal Problem:   Sepsis due to pneumonia [facility]) Active Problems:   Obesity, unspecified   Rheumatoid arthritis involving multiple sites with positive rheumatoid factor (HCC)   Elevated sed rate   Polymyalgia rheumatica (HCC)   Iron deficiency anemia   Transient hypotension   Acute respiratory failure with hypoxia (HCC)   Pressure injury of skin   1. Acute hypoxic respiratory failure due to left lower lobe pneumonia, present on  admission, complicated with sepsis. Dyspnea continue to improve but not yet back to baseline. Oxygenation is 100% on 3 L/ min per HFNC.   Will continue wean off supplemental oxygen, to keep oxygen saturation more than 92%, antibiotic therapy with IV ceftriaxone. Continue with bronchodilator therapy. Consult PT/OT, out of bed to chair tid with meals. Incentive spirometer and flutter valve.   2. Paroxysmal atrial fibrillation with acute on chronic diastolic heart failure/ aortic stenosis. Continue rate control with metoprolol, patient had one dose of furosemide yesterday with improvement of oxygenation.  Continue anticoagulation with apixaban.  3. AKI with hypokalemia/ hypomagnesemia. Renal function with serum cr is 1,13, with na at 139 and serum bicarbonate at 26. K is 4,0.   Will continue close follow up on renal function and electrolytes. Will hold on furosemide for now. On oral mag sulfate.   4. Controlled T2DM Hgb A1c 5,7/ dyslipidemia. Continue glucose cover and monitoring with insulin sliding scale and basal insulin 14 units and pre -meal 6 units.   Continue with atorvastatin. Patient has been resumed on home dose of prednisone.   5. Iron deficiency anemia. Stable hgb and hct.   6. Obesity class 1. BMI 31  7. GERD. Continue pantoprazole.   8. E coli urine infection. Ceftriaxone will cover urinary pathogens.   9. RA. Continue with methotrexate.   10. COPD. Continue with bronchodilator therapy, resumed with home dose prednisone.   11. Stage 1 pressure ulcer, present on admission/ buttocks. Continue with local wound care.   Status is: Inpatient  Remains inpatient appropriate because:Inpatient level of care appropriate due to severity of illness   Dispo: The patient is from: Home              Anticipated d/c is  to: Home              Anticipated d/c date is: 2 days              Patient currently is not medically stable to d/c.   DVT prophylaxis: apixaban   Code Status:   full   Family Communication:  I spoke with patient's son at the bedside, we talked in detail about patient's condition, plan of care and prognosis and all questions were addressed.      Nutrition Status:           Skin Documentation: Pressure Injury 01/14/20 Buttocks Stage 1 -  Intact skin with non-blanchable redness of a localized area usually over a bony prominence. (Active)  01/14/20 2350  Location: Buttocks  Location Orientation:   Staging: Stage 1 -  Intact skin with non-blanchable redness of a localized area usually over a bony prominence.  Wound Description (Comments):   Present on Admission: Yes     Consultants:   Cardiology     Antimicrobials:   Ceftriaxone     Subjective: Patient continue to be very weak and deconditioned, no nausea or vomiting, no chest pain or dyspnea.   Objective: Vitals:   01/18/20 0127 01/18/20 0338 01/18/20 0901 01/18/20 0926  BP:  (!) 127/56 (!) 130/52   Pulse: 63 67  74  Resp: 15 19    Temp:  (!) 97.3 F (36.3 C) 98.7 F (37.1 C)   TempSrc:  Oral Oral   SpO2: 99% 100%    Weight:  76.9 kg    Height:        Intake/Output Summary (Last 24 hours) at 01/18/2020 1100 Last data filed at 01/18/2020 1000 Gross per 24 hour  Intake 900.91 ml  Output 2701 ml  Net -1800.09 ml   Filed Weights   01/16/20 0019 01/17/20 0600 01/18/20 0338  Weight: 78.9 kg 77.4 kg 76.9 kg    Examination:   General: Not in pain or dyspnea, deconditioned  Neurology: Awake and alert, non focal  E ENT: mild pallor, no icterus, oral mucosa moist Cardiovascular: No JVD. S1-S2 present, rhythmic, no gallops, rubs, or murmurs. ++/+++ pitting bilateral lower extremity edema. Pulmonary: positive breath sounds bilaterally, with decreased breath sounds at bases Gastrointestinal. Abdomen soft and non tender  Skin. No rashes Musculoskeletal: no joint deformities     Data Reviewed: I have personally reviewed following labs and imaging studies  CBC: Recent  Labs  Lab 01/13/20 0523 01/13/20 0523 01/14/20 0403 01/15/20 0854 01/16/20 0636 01/17/20 1057 01/18/20 0248  WBC 18.1*   < > 22.6* 20.3* 16.4* 14.7* 14.0*  NEUTROABS 16.5*  --   --   --   --   --   --   HGB 9.1*   < > 7.8* 8.0* 9.0* 9.0* 8.2*  HCT 31.8*   < > 27.3* 27.7* 30.3* 31.0* 27.5*  MCV 87.4   < > 87.5 86.0 85.8 84.7 85.1  PLT 435*   < > 344 408* 500* 522* 492*   < > = values in this interval not displayed.   Basic Metabolic Panel: Recent Labs  Lab 01/14/20 0403 01/15/20 0854 01/16/20 0636 01/17/20 1057 01/18/20 0248  NA 138 137 140 139 139  K 4.7 3.7 3.9 3.5 4.0  CL 104 105 106 104 104  CO2 23 22 24 22 26   GLUCOSE 232* 207* 99 133* 96  BUN 20 21 19 17 21   CREATININE 1.06* 0.99 1.10* 1.09* 1.13*  CALCIUM 8.5*  8.4* 8.9 8.8* 8.8*  MG  --  1.8 1.6* 1.8 1.8  PHOS  --   --  3.3  --   --    GFR: Estimated Creatinine Clearance: 35.6 mL/min (A) (by C-G formula based on SCr of 1.13 mg/dL (H)). Liver Function Tests: Recent Labs  Lab 01/13/20 0523 01/16/20 0636 01/17/20 1057  AST 27 29 23   ALT 15 22 20   ALKPHOS 66 54 51  BILITOT 0.9 0.9 0.5  PROT 6.6 6.3* 6.2*  ALBUMIN 3.1* 2.7* 2.6*   No results for input(s): LIPASE, AMYLASE in the last 168 hours. No results for input(s): AMMONIA in the last 168 hours. Coagulation Profile: Recent Labs  Lab 01/13/20 0523  INR 1.5*   Cardiac Enzymes: No results for input(s): CKTOTAL, CKMB, CKMBINDEX, TROPONINI in the last 168 hours. BNP (last 3 results) No results for input(s): PROBNP in the last 8760 hours. HbA1C: No results for input(s): HGBA1C in the last 72 hours. CBG: Recent Labs  Lab 01/17/20 0727 01/17/20 1157 01/17/20 1633 01/17/20 2111 01/18/20 0614  GLUCAP 119* 125* 127* 121* 99   Lipid Profile: No results for input(s): CHOL, HDL, LDLCALC, TRIG, CHOLHDL, LDLDIRECT in the last 72 hours. Thyroid Function Tests: No results for input(s): TSH, T4TOTAL, FREET4, T3FREE, THYROIDAB in the last 72  hours. Anemia Panel: No results for input(s): VITAMINB12, FOLATE, FERRITIN, TIBC, IRON, RETICCTPCT in the last 72 hours.    Radiology Studies: I have reviewed all of the imaging during this hospital visit personally     Scheduled Meds: . acetaminophen  650 mg Oral QHS  . apixaban  5 mg Oral BID  . atorvastatin  40 mg Oral QHS  . Chlorhexidine Gluconate Cloth  6 each Topical Daily  . escitalopram  5 mg Oral Daily  . folic acid  2 mg Oral Daily  . guaiFENesin  600 mg Oral BID  . insulin aspart  0-15 Units Subcutaneous TID WC  . insulin aspart  0-5 Units Subcutaneous QHS  . insulin aspart  6 Units Subcutaneous TID with meals  . insulin detemir  14 Units Subcutaneous q morning - 10a  . magnesium oxide  400 mg Oral BID  . methotrexate  12.5 mg Oral Q Wed  . metoprolol succinate  100 mg Oral q morning - 10a  . pantoprazole  40 mg Oral QPC lunch  . predniSONE  4 mg Oral Q breakfast  . sodium chloride flush  3 mL Intravenous Q12H   Continuous Infusions: . sodium chloride 250 mL (01/18/20 0722)  . cefTRIAXone (ROCEPHIN)  IV 1 g (01/18/20 0726)     LOS: 5 days        Sophia Ward Gerome Apley, MD

## 2020-01-19 LAB — GLUCOSE, CAPILLARY
Glucose-Capillary: 107 mg/dL — ABNORMAL HIGH (ref 70–99)
Glucose-Capillary: 238 mg/dL — ABNORMAL HIGH (ref 70–99)
Glucose-Capillary: 137 mg/dL — ABNORMAL HIGH (ref 70–99)
Glucose-Capillary: 126 mg/dL — ABNORMAL HIGH (ref 70–99)

## 2020-01-19 LAB — CBC WITH DIFFERENTIAL/PLATELET
Abs Immature Granulocytes: 0.3 10*3/uL — ABNORMAL HIGH (ref 0.00–0.07)
Basophils Absolute: 0.1 10*3/uL (ref 0.0–0.1)
Basophils Relative: 1 %
Eosinophils Absolute: 0.8 10*3/uL — ABNORMAL HIGH (ref 0.0–0.5)
Eosinophils Relative: 5 %
HCT: 28.7 % — ABNORMAL LOW (ref 36.0–46.0)
Hemoglobin: 8.5 g/dL — ABNORMAL LOW (ref 12.0–15.0)
Immature Granulocytes: 2 %
Lymphocytes Relative: 18 %
Lymphs Abs: 2.8 10*3/uL (ref 0.7–4.0)
MCH: 24.8 pg — ABNORMAL LOW (ref 26.0–34.0)
MCHC: 29.6 g/dL — ABNORMAL LOW (ref 30.0–36.0)
MCV: 83.7 fL (ref 80.0–100.0)
Monocytes Absolute: 1.5 10*3/uL — ABNORMAL HIGH (ref 0.1–1.0)
Monocytes Relative: 10 %
Neutro Abs: 10.2 10*3/uL — ABNORMAL HIGH (ref 1.7–7.7)
Neutrophils Relative %: 64 %
Platelets: 573 10*3/uL — ABNORMAL HIGH (ref 150–400)
RBC: 3.43 MIL/uL — ABNORMAL LOW (ref 3.87–5.11)
RDW: 21.7 % — ABNORMAL HIGH (ref 11.5–15.5)
WBC: 15.7 10*3/uL — ABNORMAL HIGH (ref 4.0–10.5)
nRBC: 0 % (ref 0.0–0.2)

## 2020-01-19 LAB — BASIC METABOLIC PANEL
Anion gap: 9 (ref 5–15)
BUN: 15 mg/dL (ref 8–23)
CO2: 28 mmol/L (ref 22–32)
Calcium: 9 mg/dL (ref 8.9–10.3)
Chloride: 101 mmol/L (ref 98–111)
Creatinine, Ser: 0.99 mg/dL (ref 0.44–1.00)
GFR calc Af Amer: >60 mL/min (ref >60)
GFR calc non Af Amer: 52 mL/min — ABNORMAL LOW (ref >60)
Glucose, Bld: 112 mg/dL — ABNORMAL HIGH (ref 70–99)
Potassium: 3.3 mmol/L — ABNORMAL LOW (ref 3.5–5.1)
Sodium: 138 mmol/L (ref 135–145)

## 2020-01-19 MED ORDER — POTASSIUM CHLORIDE CRYS ER 20 MEQ PO TBCR
40.0000 meq | EXTENDED_RELEASE_TABLET | Freq: Once | ORAL | Status: AC
  Administered 2020-01-19: 40 meq via ORAL
  Filled 2020-01-19: qty 2

## 2020-01-19 NOTE — Progress Notes (Signed)
RN spoke with PT per MD request. PT states she will see pt.

## 2020-01-19 NOTE — Progress Notes (Addendum)
Name: Sophia Ward MRN: [id number] DOB: 1933/05/12    ADMISSION DATE:  01/13/2020 CONSULTATION DATE: 01/17/2019  REFERRING MD : [facility]  CHIEF COMPLAINT: Fevers, chills, nausea, shortness of breath  BRIEF PATIENT DESCRIPTION:  86 year old female no acute distress currently on 10 L nasal cannula  SIGNIFICANT EVENTS  Admission 8/16  STUDIES:  CXR 01/16/2021 Cardiomegaly with mild central congestion. Probable small left effusion. Partial resolution of streaky and patchy left greater than right pulmonary opacities, possible bilateral pneumonia  HISTORY OF PRESENT ILLNESS:   86 year old female with extensive past medical history mainly of cardiac issues without pulmonary issues.  She does normally check her O2 saturations on a daily basis and runs 93% when she arouses up to 97% with activity.  She is admitted by the [facility] hospitalist service on 01/13/2020 after noting cough increasing shortness of breath small amount of nausea without evidence of aspiration was noted to be hypoxic on admission to the hospital.  She is treated with antimicrobial therapy oxygen therapy.  She did receive Lasix 24 hours previous to this exam and reports feeling much better on 01/17/2020.  She is currently on 10 L nasal cannula sats of 97%.  She is at -2 L for 24 hours.  Reports decreased lower extremity edema.  Pulmonary critical care asked to evaluate for reportedly being refractory to current interventions.  She does not normally see a pulmonary physician.  She does have an extensive past medical history and 2D echo be remarkable for mitral valve regurgitation aortic valve regurgitation and aortic stenosis.  She does have atrial fibrillation and is on Eliquis for this. Agree with antimicrobial therapy.  Agree with diuresis.  May give consideration to cardiology evaluation unless she just continues to improve with diuresis.  She denies wheezing.  She was positive for fever of 99.4 along with some chills that he  says has resolved since admission. Past medical history is remarkable for coronary artery disease, diabetes mellitus without complication, essential hypertension benign, gastroesophageal reflux disease, hypertension, myocardial infarction with stent placement.  Rheumatoid arthritis treated with methotrexate.   SUBJECTIVE:  Net negative 4.6 L States she is feeling much better, weaned to 2 L [place] Sats are 97% Less lower extremity edema Weight is down 6 pounds last 24 hours WBC remains elevated at 15.7 T max 99   VITAL SIGNS: Temp:  [98.4 F (36.9 C)-99 F (37.2 C)] 99 F (37.2 C) (08/21 2100) Pulse Rate:  [72-74] 74 (08/22 0823) Resp:  [15-21] 17 (08/21 2100) BP: (116-138)/(44-75) 138/56 (08/22 0819) SpO2:  [97 %-100 %] 97 % (08/22 0823)  PHYSICAL EXAMINATION: General: Obese female supine in bed, in NAD on 2 L [place]  Neuro: A&O x 3, MAE x 4, appropriate HEENT: No JVD or lymphadenopathy is noted, NCAT Cardiovascular: S1, S2, RRR, No RMG, Heart sounds are distant Lungs: Bilateral chest excursion, Clear throughout, diminished per bases Abdomen: Obese, soft ,nontender, positive bowel sounds, Body mass index is 31 kg/m. Musculoskeletal: Grossly intact multiple areas of ecchymosis Skin: Warm and dry, intact, no lesions or rash  Recent Labs  Lab 01/17/20 1057 01/18/20 0248 01/19/20 0609  NA 139 139 138  K 3.5 4.0 3.3*  CL 104 104 101  CO2 22 26 28   BUN 17 21 15   CREATININE 1.09* 1.13* 0.99  GLUCOSE 133* 96 112*   Recent Labs  Lab 01/17/20 1057 01/18/20 0248 01/19/20 0609  HGB 9.0* 8.2* 8.5*  HCT 31.0* 27.5* 28.7*  WBC 14.7* 14.0* 15.7*  PLT 522* 492*  573*   DG CHEST PORT 1 VIEW  Result Date: 01/17/2020 CLINICAL DATA:  Shortness of breath and fever EXAM: PORTABLE CHEST 1 VIEW COMPARISON:  01/13/2020, 10/27/2019, 11/07/2016 FINDINGS: Cardiomegaly with mild central vascular congestion. Probable small left effusion. Streaky airspace opacity at the left base. Aortic  atherosclerosis. No pneumothorax. Patchy peripheral opacities are slightly improved. IMPRESSION: 1. Cardiomegaly with mild central congestion. 2. Probable small left effusion. 3. Partial resolution of streaky and patchy left greater than right pulmonary opacities, possible bilateral pneumonia Electronically Signed   By: Donavan Foil M.D.   On: 01/17/2020 17:07    ASSESSMENT: Principal Problem:   Sepsis due to pneumonia [facility]) Active Problems:   Obesity, unspecified   Rheumatoid arthritis involving multiple sites with positive rheumatoid factor (HCC)   Elevated sed rate   Polymyalgia rheumatica (HCC)   Iron deficiency anemia   Transient hypotension   Acute respiratory failure with hypoxia (HCC)   Pressure injury of skin   Discussion: This is an 86 year old female, past medical history of hypertension, coronary artery disease, rheumatoid arthritis on methotrexate.  Patient presents to the hospital with complaints of shortness of breath.,  Has hypertension, hyperlipidemia atrial fibrillation, aortic stenosis, type 2 diabetes, polymyalgia rheumatica on chronic prednisone.  She was hypoxemic with bilateral pulmonary opacities.  Pulmonary was consulted for ongoing hypoxemia.  Labs: Reviewed, white blood cell count 14.7, platelets 522, hemoglobin 9, serum creatinine 1.09 Chest x-ray: Enlarged cardiac silhouette, bilateral vascular congestion persistent pulmonary opacities. The patient's images have been independently reviewed by me.    RVP panel negative  Urine culture positive for E. coli  Assessment: Acute hypoxemic respiratory failure requiring nasal cannula O2 supplementation, sepsis, bilateral pulmonary infiltrates concerning for pneumonia. Longstanding history of methotrexate use a history of aortic stenosis and atrial fibrillation. Currently being treated for pneumonia with bilateral infiltrates   + leukocytosis and lactic acidosis on admission.   Cultures at this point have been  negative.  T Max last 24 hours 99 Thrombocytosis, likely reactive secondary to above Atrial fibrillation, on Eliquis E. coli UTI, sepsis  AKI, improving Rheumatoid arthritis and polymyalgia rheumatica on chronic immunosuppressive therapy, methotrexate and steroids. At risk for atypical infrction Plan: Continue weaning oxygen to maintain oxygen saturations of > 94% Continue antibiotics course for CAP, as well as coverage for E Coli UTI Continue Diuresis per primary team as renal function allows Continue methotrexate Continue prednisone 4 mg daily Consider CT of the chest for baseline Consider PFTs when better establish pulmonary baseline Consider follow-up with pulmonary as an outpatient. Trend BNP Trend CBC and Fever curve Culture as is clinically appropriate CXR prn to follow for resolution of pneumonia  Magdalen Spatz, MSN, AGACNP-BC [place] for personal pager PCCM on call pager [phone]  01/19/2020 8:45 AM     PCCM:  86 yo AHRF, pulmonary infiltrates, possible pna vs pulmonary edema. She has improved with diuresis. Down 6 lbs since admission. No longer requiring 02 supplementation. BP (!) 131/54 (BP Location: Right Arm)   Pulse 74   Temp 98.8 F (37.1 C) (Oral)   Resp 17   Ht 5\' 2"  (1.575 m)   Wt 76.9 kg   SpO2 97%   BMI 31.00 kg/m  Exam: lungs clear. Heart: reg rate rhythm, s1 s2, now in sinus. Labs reviewed. WBC remains elevated but stable H&H stable. A: AHRF, resolved, now off O2, suspect from pna and pulmonary edema, with Afib, now in sinus and rate controlled. Sepsis from  e coli uti P: complete course of abx. Diuresis to maintain euvolemia. Keep RA meds mtx+pred. She needs a repeat CXR to ensure resolution of infiltrates.  Pulmonary will sign off at this time. Thanks for the consult.    [facility] 01/19/2020 5:22 PM

## 2020-01-19 NOTE — Progress Notes (Signed)
Physical Therapy Treatment Patient Details Name: Sophia Ward MRN: [id number] DOB: Mar 31, 1933 Today's Date: 01/19/2020    History of Present Illness 86 y.o. female with medical history significant of HTN, HLD, PAF, CAD s/p stent, AS, DM type II, RA on MTX, PMR on chronic prednisone, and spinal stenosis presents with complaints of shortness of breath and cough and fever. EMS found pt to be 68%O2 on RA.  In ED, required 15L norebreather, Chest x-ray showing patchy opacity concerning for atypical pneumonia more so than edema. Admitted 01/13/20 for treatment of respiratory failure with hypoxia, transient hypotension and AKI. In A-fib with RVR 01/15/20    PT Comments    Continuing work on functional mobility and activity tolerance;  Much improved endurance and balance with significantly less need for supplemental O2;  Session conducted on room air, and O2 sats remained greater than or equal to 89%; no pressing need for supplemental O2 at discharge; Ended session on room air and notified Sarah, RN   Follow Up Recommendations  Home health PT;Supervision for mobility/OOB     Equipment Recommendations  None recommended by PT    Recommendations for Other Services       Precautions / Restrictions Precautions Precautions: None    Mobility  Bed Mobility Overal bed mobility: Modified Independent             General bed mobility comments: use of bed rail   Transfers Overall transfer level: Needs assistance Equipment used: None Transfers: Sit to/from Stand Sit to Stand: Supervision Stand pivot transfers: Supervision       General transfer comment: Supervision and setup to manage lines  Ambulation/Gait Ambulation/Gait assistance: Min guard;Supervision Gait Distance (Feet): 200 Feet Assistive device: None Gait Pattern/deviations: Step-through pattern     General Gait Details: Walked in hallway with little difficulty; level of assist initially minguard, but progressed to  Supervision, with occasional cues to self-monitor for activity tolerance; DOE towards the end of the walk 2/4 on room air   Stairs             Wheelchair Mobility    Modified Rankin (Stroke Patients Only)       Balance                                            Cognition Arousal/Alertness: Awake/alert Behavior During Therapy: WFL for tasks assessed/performed Overall Cognitive Status: Within Functional Limits for tasks assessed                                        Exercises      General Comments General comments (skin integrity, edema, etc.): Session conducted on room air, and O2 sats remained greater than or equal to 89%; no need for supplemental O2      Pertinent Vitals/Pain Pain Assessment: No/denies pain    Home Living                      Prior Function            PT Goals (current goals can now be found in the care plan section) Acute Rehab PT Goals Patient Stated Goal: to go home PT Goal Formulation: With patient/family Time For Goal Achievement: 01/31/20 Potential to Achieve Goals: Good Progress towards PT goals: Progressing  toward goals    Frequency    Min 3X/week      PT Plan Current plan remains appropriate    Co-evaluation              AM-PAC PT "6 Clicks" Mobility   Outcome Measure  Help needed turning from your back to your side while in a flat bed without using bedrails?: None Help needed moving from lying on your back to sitting on the side of a flat bed without using bedrails?: None Help needed moving to and from a bed to a chair (including a wheelchair)?: None Help needed standing up from a chair using your arms (e.g., wheelchair or bedside chair)?: None Help needed to walk in hospital room?: None Help needed climbing 3-5 steps with a railing? : A Little 6 Click Score: 23    End of Session Equipment Utilized During Treatment: Gait belt Activity Tolerance: Patient tolerated  treatment well Patient left: in bed;with call bell/phone within reach;with family/visitor present Nurse Communication: Mobility status PT Visit Diagnosis: Muscle weakness (generalized) (M62.81);Difficulty in walking, not elsewhere classified (R26.2)     Time: 2549-8264 PT Time Calculation (min) (ACUTE ONLY): 25 min  Charges:  $Gait Training: 23-37 mins                     Roney Marion, [place]  Acute Rehabilitation Services Pager [phone] Office [place] 01/19/2020, 1:46 PM

## 2020-01-19 NOTE — Progress Notes (Signed)
OT Cancellation Note  Patient Details Name: Sophia Ward MRN: [id number] DOB: 1933-03-09   Cancelled Treatment:    Reason Eval/Treat Not Completed: OT screened, no needs identified, will sign off-- patient evaluated by OT on 8/20 with no further OT Needs identified.  Screened patient again today, 8/22, briefly discussed energy conservation techniques but patient declined need for further OT services as patient reports "I'll be good once all these lines come off".  Daughter present and agreeable as well, reporting "And she has a lot of help at home, so she will be good".  OT will sign off. If further needs arise, please re-consult.   Jolaine Artist, OT Acute Rehabilitation Services Pager [phone] Office [phone]   Delight Stare 01/19/2020, 9:57 AM

## 2020-01-19 NOTE — Progress Notes (Signed)
PROGRESS NOTE    Sophia Ward  OZD:[id number] DOB: April 30, 1933 DOA: 01/13/2020 PCP: [name], MD    Brief Narrative:  Patient admitted to the hospital with working diagnosis of acute hypoxic respiratory failure due to pneumonia complicated by sepsis.  86 year old female with past medical history for hypertension, dyslipidemia, coronary disease, aortic stenosis, paroxysmal atrial fibrillation, type 2 diabetes mellitus and rheumatoid arthritis who presented with cough and dyspnea.  She reported 3 days of shortness of breath, associated with wheezing, fever, chills, generalized malaise, nausea, vomiting and diarrhea.  On her initial physical examination respiratory rate 19-28, blood pressure 94/46, positive hypoxemia 68% requiring 15 L of nonrebreather to achieve cure 92%.  Dry mucous membranes, positive increased work of breathing, heart S1-S2, present, tachycardic, abdomen soft, positive trace lower extremity edema Sodium 139, potassium 3.2, chloride 105, bicarb 16, glucose 226, BUN 18, creatinine 1.36, white count 18.1, hemoglobin 9.1, hematocrit 31.8, platelets 435.  Lactate 3.6 SARS COVID-19 negative.  Chest radiograph with increased lung markings, positive retrocardiac infiltrate at the left base.  EKG 97 bpm, normal axis, normal intervals, sinus rhythm, ST depressions in lead II, lead III, aVF, V4-V6, no significant T wave changes.  Patient has been placed on antibiotic therapy and diuresis, slowly improving oxygenation.   Assessment & Plan:   Principal Problem:   Sepsis due to pneumonia [facility]) Active Problems:   Obesity, unspecified   Rheumatoid arthritis involving multiple sites with positive rheumatoid factor (HCC)   Elevated sed rate   Polymyalgia rheumatica (HCC)   Iron deficiency anemia   Transient hypotension   Acute respiratory failure with hypoxia (HCC)   Pressure injury of skin    1. Acute hypoxic respiratory failure due to left lower lobe pneumonia,  present on admission, complicated with sepsis. Continue to improve dyspnea, today patient is off supplemental oxygen with good toleration.  Wbc continue to be elevated at 15.7  On ronchodilator therapy and airway clearing techniques with Incentive spirometer and flutter valve.  Antibiotic therapy has been discontinued. Follow up chest film in am per pulmonary.   2. Paroxysmal atrial fibrillation with acute on chronic diastolic heart failure/ aortic stenosis. Urine output over last 24 H is 1,550 ml with improvement in volume status and oxygenation.  Rate control with metoprolol, anticoagulation with apixaban.  3. AKI with hypokalemia/ hypomagnesemia. Stable renal function with serum cr at 0,99, K down to 3,3 and serum bicarbonate at 28.   Continue K correction with Kcl, will need 80 meq in 2 divided doses, follow up renal function in am. Holding on further diuresis for now.   4. Controlled T2DM Hgb A1c 5,7/ dyslipidemia. Fating glucose this am 112, continue with insulin sliding scale, basal insulin 14 units, pre -meal 6 units for glucose control.   Patient is tolerating po well.   On atorvastatin.   5. Iron deficiency anemia.  Follow cell count as outpatient.   6. Obesity class 1/ depression. BMI 31. Follow as outpatient. Continue with escitalopram.   7. GERD. On pantoprazole.   8. E coli urine infection. Completed antibiotic therapy.   9. RA. On methotrexate. No clinical signs of acute exacerbation.   10. COPD. On bronchodilator therapy, no back on home dose of prednisone.    11. Stage 1 pressure ulcer, present on admission/ buttocks. On local wound care.     Status is: Inpatient  Remains inpatient appropriate because:Inpatient level of care appropriate due to severity of illness   Dispo: The patient is from: Home  Anticipated d/c is to: Home              Anticipated d/c date is: 1 day              Patient currently is not medically stable to  d/c.   DVT prophylaxis: Enoxaparin   Code Status:   full  Family Communication:  No family at the bedside      Nutrition Status:           Skin Documentation: Pressure Injury 01/14/20 Buttocks Stage 1 -  Intact skin with non-blanchable redness of a localized area usually over a bony prominence. (Active)  01/14/20 2350  Location: Buttocks  Location Orientation:   Staging: Stage 1 -  Intact skin with non-blanchable redness of a localized area usually over a bony prominence.  Wound Description (Comments):   Present on Admission: Yes     Consultants:   Cardiology   Pulmonary    Subjective: Patient is feeling better, continue to improve dyspnea, no chest pain, no nausea or vomiting.   Objective: Vitals:   01/18/20 1700 01/18/20 2100 01/19/20 0819 01/19/20 0823  BP: 116/75 (!) 126/44 (!) 138/56 107/72  Pulse:  73 73 74  Resp:  17    Temp: 98.4 F (36.9 C) 99 F (37.2 C)  98.8 F (37.1 C)  TempSrc: Oral Oral  Oral  SpO2:  99%  97%  Weight:      Height:        Intake/Output Summary (Last 24 hours) at 01/19/2020 1438 Last data filed at 01/19/2020 1254 Gross per 24 hour  Intake 1080 ml  Output 850 ml  Net 230 ml   Filed Weights   01/16/20 0019 01/17/20 0600 01/18/20 0338  Weight: 78.9 kg 77.4 kg 76.9 kg    Examination:   General: Not in pain or dyspnea.,  Neurology: Awake and alert, non focal  E ENT: no pallor, no icterus, oral mucosa moist Cardiovascular: No JVD. S1-S2 present, rhythmic positive systolic murmurs. trace bilateral lower extremity edema. Pulmonary: vesicular breath sounds bilaterally, mild decreased breath sounds at bases, no wheezing.  Gastrointestinal. Abdomen flat, no organomegaly, non tender, no rebound or guarding Skin. No rashes Musculoskeletal: no joint deformities     Data Reviewed: I have personally reviewed following labs and imaging studies  CBC: Recent Labs  Lab 01/13/20 0523 01/14/20 0403 01/15/20 0854  01/16/20 0636 01/17/20 1057 01/18/20 0248 01/19/20 0609  WBC 18.1*   < > 20.3* 16.4* 14.7* 14.0* 15.7*  NEUTROABS 16.5*  --   --   --   --   --  10.2*  HGB 9.1*   < > 8.0* 9.0* 9.0* 8.2* 8.5*  HCT 31.8*   < > 27.7* 30.3* 31.0* 27.5* 28.7*  MCV 87.4   < > 86.0 85.8 84.7 85.1 83.7  PLT 435*   < > 408* 500* 522* 492* 573*   < > = values in this interval not displayed.   Basic Metabolic Panel: Recent Labs  Lab 01/15/20 0854 01/16/20 0636 01/17/20 1057 01/18/20 0248 01/19/20 0609  NA 137 140 139 139 138  K 3.7 3.9 3.5 4.0 3.3*  CL 105 106 104 104 101  CO2 22 24 22 26 28   GLUCOSE 207* 99 133* 96 112*  BUN 21 19 17 21 15   CREATININE 0.99 1.10* 1.09* 1.13* 0.99  CALCIUM 8.4* 8.9 8.8* 8.8* 9.0  MG 1.8 1.6* 1.8 1.8  --   PHOS  --  3.3  --   --   --  GFR: Estimated Creatinine Clearance: 40.6 mL/min (by C-G formula based on SCr of 0.99 mg/dL). Liver Function Tests: Recent Labs  Lab 01/13/20 0523 01/16/20 0636 01/17/20 1057  AST 27 29 23   ALT 15 22 20   ALKPHOS 66 54 51  BILITOT 0.9 0.9 0.5  PROT 6.6 6.3* 6.2*  ALBUMIN 3.1* 2.7* 2.6*   No results for input(s): LIPASE, AMYLASE in the last 168 hours. No results for input(s): AMMONIA in the last 168 hours. Coagulation Profile: Recent Labs  Lab 01/13/20 0523  INR 1.5*   Cardiac Enzymes: No results for input(s): CKTOTAL, CKMB, CKMBINDEX, TROPONINI in the last 168 hours. BNP (last 3 results) No results for input(s): PROBNP in the last 8760 hours. HbA1C: No results for input(s): HGBA1C in the last 72 hours. CBG: Recent Labs  Lab 01/18/20 1127 01/18/20 1733 01/18/20 2033 01/19/20 0705 01/19/20 1252  GLUCAP 215* 96 181* 107* 238*   Lipid Profile: No results for input(s): CHOL, HDL, LDLCALC, TRIG, CHOLHDL, LDLDIRECT in the last 72 hours. Thyroid Function Tests: No results for input(s): TSH, T4TOTAL, FREET4, T3FREE, THYROIDAB in the last 72 hours. Anemia Panel: No results for input(s): VITAMINB12, FOLATE,  FERRITIN, TIBC, IRON, RETICCTPCT in the last 72 hours.    Radiology Studies: I have reviewed all of the imaging during this hospital visit personally     Scheduled Meds: . acetaminophen  650 mg Oral QHS  . apixaban  5 mg Oral BID  . atorvastatin  40 mg Oral QHS  . Chlorhexidine Gluconate Cloth  6 each Topical Daily  . escitalopram  5 mg Oral Daily  . folic acid  2 mg Oral Daily  . guaiFENesin  600 mg Oral BID  . insulin aspart  0-15 Units Subcutaneous TID WC  . insulin aspart  0-5 Units Subcutaneous QHS  . insulin aspart  6 Units Subcutaneous TID with meals  . insulin detemir  14 Units Subcutaneous q morning - 10a  . methotrexate  12.5 mg Oral Q Wed  . metoprolol succinate  100 mg Oral q morning - 10a  . pantoprazole  40 mg Oral QPC lunch  . predniSONE  4 mg Oral Q breakfast  . sodium chloride flush  3 mL Intravenous Q12H   Continuous Infusions: . sodium chloride 250 mL (01/19/20 0628)     LOS: 6 days        Aziza Stuckert Gerome Apley, MD

## 2020-01-19 NOTE — Progress Notes (Signed)
RN rounded on pt. Pt states she does not need anything at this time. Pt's daughter is at bedside.

## 2020-01-19 NOTE — Progress Notes (Signed)
Progress Note  Patient Name: Sophia Ward Date of Encounter: 01/19/2020  Primary Cardiologist: Larae Grooms, MD   Subjective   The patient continues to feel better, improved shortness of breath.  Inpatient Medications    Scheduled Meds: . acetaminophen  650 mg Oral QHS  . apixaban  5 mg Oral BID  . atorvastatin  40 mg Oral QHS  . Chlorhexidine Gluconate Cloth  6 each Topical Daily  . escitalopram  5 mg Oral Daily  . folic acid  2 mg Oral Daily  . guaiFENesin  600 mg Oral BID  . insulin aspart  0-15 Units Subcutaneous TID WC  . insulin aspart  0-5 Units Subcutaneous QHS  . insulin aspart  6 Units Subcutaneous TID with meals  . insulin detemir  14 Units Subcutaneous q morning - 10a  . methotrexate  12.5 mg Oral Q Wed  . metoprolol succinate  100 mg Oral q morning - 10a  . pantoprazole  40 mg Oral QPC lunch  . predniSONE  4 mg Oral Q breakfast  . sodium chloride flush  3 mL Intravenous Q12H   Continuous Infusions: . sodium chloride 250 mL (01/19/20 0628)   PRN Meds: sodium chloride, acetaminophen **OR** [DISCONTINUED] acetaminophen, estradiol, ipratropium-albuterol, ondansetron **OR** ondansetron (ZOFRAN) IV, sodium chloride flush, zolpidem   Vital Signs    Vitals:   01/18/20 1700 01/18/20 2100 01/19/20 0819 01/19/20 0823  BP: 116/75 (!) 126/44 (!) 138/56 107/72  Pulse:  73 73 74  Resp:  17    Temp: 98.4 F (36.9 C) 99 F (37.2 C)  98.8 F (37.1 C)  TempSrc: Oral Oral  Oral  SpO2:  99%  97%  Weight:      Height:        Intake/Output Summary (Last 24 hours) at 01/19/2020 0958 Last data filed at 01/19/2020 0940 Gross per 24 hour  Intake 840 ml  Output 1551 ml  Net -711 ml   Filed Weights   01/16/20 0019 01/17/20 0600 01/18/20 0338  Weight: 78.9 kg 77.4 kg 76.9 kg    Telemetry    Sinus rhythm in 60s- Personally Reviewed  ECG    No new tracings - Personally Reviewed  Physical Exam   GEN:  Not in acute distress.   Neck: No JVD, no  carotid bruits Cardiac: RRR, 5 out of 6 systolic murmur, rubs, or gallops.  Respiratory: Clear to auscultation bilaterally, no wheezes/ rales/ rhonchi GI: NABS, Soft, nontender, non-distended  MS: No edema; No deformity. Neuro:  Nonfocal, moving all extremities spontaneously Psych: Normal affect   Labs    Chemistry Recent Labs  Lab 01/13/20 0523 01/14/20 0403 01/16/20 0636 01/16/20 0636 01/17/20 1057 01/18/20 0248 01/19/20 0609  NA 139   < > 140   < > 139 139 138  K 3.2*   < > 3.9   < > 3.5 4.0 3.3*  CL 105   < > 106   < > 104 104 101  CO2 16*   < > 24   < > 22 26 28   GLUCOSE 226*   < > 99   < > 133* 96 112*  BUN 18   < > 19   < > 17 21 15   CREATININE 1.36*   < > 1.10*   < > 1.09* 1.13* 0.99  CALCIUM 8.8*   < > 8.9   < > 8.8* 8.8* 9.0  PROT 6.6  --  6.3*  --  6.2*  --   --  ALBUMIN 3.1*  --  2.7*  --  2.6*  --   --   AST 27  --  29  --  23  --   --   ALT 15  --  22  --  20  --   --   ALKPHOS 66  --  54  --  51  --   --   BILITOT 0.9  --  0.9  --  0.5  --   --   GFRNONAA 36*   < > 46*   < > 47* 45* 52*  GFRAA 41*   < > 53*   < > 54* 52* >60  ANIONGAP 18*   < > 10   < > 13 9 9    < > = values in this interval not displayed.     Hematology Recent Labs  Lab 01/17/20 1057 01/18/20 0248 01/19/20 0609  WBC 14.7* 14.0* 15.7*  RBC 3.66* 3.23* 3.43*  HGB 9.0* 8.2* 8.5*  HCT 31.0* 27.5* 28.7*  MCV 84.7 85.1 83.7  MCH 24.6* 25.4* 24.8*  MCHC 29.0* 29.8* 29.6*  RDW 22.0* 21.6* 21.7*  PLT 522* 492* 573*    Cardiac EnzymesNo results for input(s): TROPONINI in the last 168 hours. No results for input(s): TROPIPOC in the last 168 hours.   BNP Recent Labs  Lab 01/13/20 0523 01/16/20 0636  BNP 277.1* 1,208.3*     DDimer No results for input(s): DDIMER in the last 168 hours.   Radiology    DG CHEST PORT 1 VIEW  Result Date: 01/17/2020 CLINICAL DATA:  Shortness of breath and fever EXAM: PORTABLE CHEST 1 VIEW COMPARISON:  01/13/2020, 10/27/2019, 11/07/2016 FINDINGS:  Cardiomegaly with mild central vascular congestion. Probable small left effusion. Streaky airspace opacity at the left base. Aortic atherosclerosis. No pneumothorax. Patchy peripheral opacities are slightly improved. IMPRESSION: 1. Cardiomegaly with mild central congestion. 2. Probable small left effusion. 3. Partial resolution of streaky and patchy left greater than right pulmonary opacities, possible bilateral pneumonia Electronically Signed   By: Donavan Foil M.D.   On: 01/17/2020 17:07    Cardiac Studies   Echocardiogram 10/24/19: 1. Left ventricular ejection fraction, by estimation, is 60 to 65%. The  left ventricle has normal function. The left ventricle has no regional  wall motion abnormalities. Left ventricular diastolic function could not  be evaluated. Elevated left  ventricular end-diastolic pressure.  2. Right ventricular systolic function is normal. The right ventricular  size is normal. There is moderately elevated pulmonary artery systolic  pressure.  3. Left atrial size was mildly dilated.  4. The mitral valve is abnormal. Trivial mitral valve regurgitation.  5. The aortic valve is tricuspid. Aortic valve regurgitation is trivial.  Severe aortic valve stenosis. Aortic valve area, by VTI measures 0.76 cm.  Aortic valve mean gradient measures 32.0 mmHg. Aortic valve Vmax measures  3.77 m/s.  6. The inferior vena cava is dilated in size with <50% respiratory  variability, suggesting right atrial pressure of 15 mmHg.   Patient Profile     Sophia Ward a 86 y.o.femalewith a historyof CAD with prior stenting to LAD, severe aortic stenosis, paroxysmal atrial fibrillation on Eliquis, hypertension, hyperlipidemia, diabetes mellitus, chronic anemia, spinal stenosis with chronic back pain, rheumatoid arthritis on Methotrexate, polymalgia rheumatica on chronic Prednisone, GERD, and vertigowho is being seen for the evaluation of atrial fibrillation with RVRat the  request of Dr. Louanne Belton.  Patient admitted with pneumonia and cardiology asked to evaluate for atrial fibrillation.  Echocardiogram May 2021 showed normal LV function, mild left atrial enlargement, trace aortic and mitral regurgitation and severe aortic stenosis (mean gradient 32 mmHg, aortic valve area of 0.76 cm).  Plan previously was consideration of cardiac catheterization in anticipation of aortic valve replacement.  Assessment & Plan    1. Paroxysmal atrial fibrillation:  -The patient cardioverted to sinus rhythm at 9 PM on 8/20, her rates are in 60s to 70s right now.  Significant symptomatic improvement.   - Continue metoprolol - Continue apixaban for stroke ppx  2.  Acute diastolic CHF: - -Diuresis overnight, - 0.7 L, weights not recorded today  -Potassium 3.3, creatinine 0.99, I will replace potassium   3. PNA: On IV antibiotics. Seen by PCCM who recommended ongoing antibiotics and diuresis with plans to wean FiO2 as tolerated.  - Continue management per primary team and PCCM  4. Aortic stenosis: severe on echo 09/2019. Planning for outpatient aortic valve replacement work-up.  - Anticipate outpatient LHC once recovered from her PNA.   5. CAD s/p PCI to LAD: no chest pain complaints. Not on aspirin given need for apixaban - Continue statin and metoprolol - Anticipate outpatient LHC once recovered from her PNA for #4  For questions or updates, please contact [facility] Please consult [email or web address] for contact info under Cardiology/STEMI.   Signed, Ena Dawley, MD  01/19/2020, 9:58 AM   [phone]

## 2020-01-20 ENCOUNTER — Inpatient Hospital Stay (HOSPITAL_COMMUNITY): Payer: Medicare Other

## 2020-01-20 ENCOUNTER — Other Ambulatory Visit: Payer: Self-pay | Admitting: Hematology

## 2020-01-20 DIAGNOSIS — I5031 Acute diastolic (congestive) heart failure: Secondary | ICD-10-CM

## 2020-01-20 LAB — CBC WITH DIFFERENTIAL/PLATELET
Abs Immature Granulocytes: 0.31 10*3/uL — ABNORMAL HIGH (ref 0.00–0.07)
Basophils Absolute: 0.1 10*3/uL (ref 0.0–0.1)
Basophils Relative: 1 %
Eosinophils Absolute: 0.6 10*3/uL — ABNORMAL HIGH (ref 0.0–0.5)
Eosinophils Relative: 5 %
HCT: 30.5 % — ABNORMAL LOW (ref 36.0–46.0)
Hemoglobin: 9 g/dL — ABNORMAL LOW (ref 12.0–15.0)
Immature Granulocytes: 2 %
Lymphocytes Relative: 19 %
Lymphs Abs: 2.6 10*3/uL (ref 0.7–4.0)
MCH: 24.9 pg — ABNORMAL LOW (ref 26.0–34.0)
MCHC: 29.5 g/dL — ABNORMAL LOW (ref 30.0–36.0)
MCV: 84.5 fL (ref 80.0–100.0)
Monocytes Absolute: 1.5 10*3/uL — ABNORMAL HIGH (ref 0.1–1.0)
Monocytes Relative: 11 %
Neutro Abs: 8.5 10*3/uL — ABNORMAL HIGH (ref 1.7–7.7)
Neutrophils Relative %: 62 %
Platelets: 584 10*3/uL — ABNORMAL HIGH (ref 150–400)
RBC: 3.61 MIL/uL — ABNORMAL LOW (ref 3.87–5.11)
RDW: 22.1 % — ABNORMAL HIGH (ref 11.5–15.5)
WBC: 13.7 10*3/uL — ABNORMAL HIGH (ref 4.0–10.5)
nRBC: 0 % (ref 0.0–0.2)

## 2020-01-20 LAB — BASIC METABOLIC PANEL
Anion gap: 9 (ref 5–15)
BUN: 12 mg/dL (ref 8–23)
CO2: 25 mmol/L (ref 22–32)
Calcium: 9.2 mg/dL (ref 8.9–10.3)
Chloride: 103 mmol/L (ref 98–111)
Creatinine, Ser: 0.98 mg/dL (ref 0.44–1.00)
GFR calc Af Amer: >60 mL/min (ref >60)
GFR calc non Af Amer: 53 mL/min — ABNORMAL LOW (ref >60)
Glucose, Bld: 99 mg/dL (ref 70–99)
Potassium: 3.9 mmol/L (ref 3.5–5.1)
Sodium: 137 mmol/L (ref 135–145)

## 2020-01-20 LAB — GLUCOSE, CAPILLARY
Glucose-Capillary: 106 mg/dL — ABNORMAL HIGH (ref 70–99)
Glucose-Capillary: 182 mg/dL — ABNORMAL HIGH (ref 70–99)

## 2020-01-20 MED ORDER — FOSFOMYCIN TROMETHAMINE 3 G PO PACK: 3.0000 g | PACK | Freq: Once | ORAL | Status: DC

## 2020-01-20 MED ORDER — SODIUM CHLORIDE 0.9 % IV SOLN
510.0000 mg | Freq: Once | INTRAVENOUS | Status: AC
  Administered 2020-01-20: 510 mg via INTRAVENOUS
  Filled 2020-01-20: qty 17

## 2020-01-20 NOTE — Progress Notes (Signed)
Progress Note  Patient Name: Sophia Ward Date of Encounter: 01/20/2020  [facility] Cardiologist: Larae Grooms, MD  Subjective   Doing well. Anxious to go home. Back on room air, reports she ambulated yesterday without dropping her O2 sats. No chest pain. Breathing stable.  Inpatient Medications    Scheduled Meds: . acetaminophen  650 mg Oral QHS  . apixaban  5 mg Oral BID  . atorvastatin  40 mg Oral QHS  . Chlorhexidine Gluconate Cloth  6 each Topical Daily  . escitalopram  5 mg Oral Daily  . folic acid  2 mg Oral Daily  . guaiFENesin  600 mg Oral BID  . insulin aspart  0-15 Units Subcutaneous TID WC  . insulin aspart  0-5 Units Subcutaneous QHS  . insulin aspart  6 Units Subcutaneous TID with meals  . insulin detemir  14 Units Subcutaneous q morning - 10a  . methotrexate  12.5 mg Oral Q Wed  . metoprolol succinate  100 mg Oral q morning - 10a  . pantoprazole  40 mg Oral QPC lunch  . predniSONE  4 mg Oral Q breakfast  . sodium chloride flush  3 mL Intravenous Q12H   Continuous Infusions: . sodium chloride 250 mL (01/19/20 0628)   PRN Meds: sodium chloride, acetaminophen **OR** [DISCONTINUED] acetaminophen, estradiol, ipratropium-albuterol, ondansetron **OR** ondansetron (ZOFRAN) IV, sodium chloride flush, zolpidem   Vital Signs    Vitals:   01/20/20 0300 01/20/20 0353 01/20/20 0402 01/20/20 0522  BP:  (!) 130/105    Pulse: 67 67  64  Resp: 16 17  12   Temp:  97.8 F (36.6 C)    TempSrc:  Oral    SpO2: 91% 94%  96%  Weight:   74.8 kg   Height:        Intake/Output Summary (Last 24 hours) at 01/20/2020 1023 Last data filed at 01/20/2020 0848 Gross per 24 hour  Intake 1260 ml  Output 1150 ml  Net 110 ml   Last 3 Weights 01/20/2020 01/18/2020 01/17/2020  Weight (lbs) 165 lb 169 lb 8 oz 170 lb 11.2 oz  Weight (kg) 74.844 kg 76.885 kg 77.429 kg      Telemetry    NSR - Personally Reviewed  ECG    No new since 01/15/20 - Personally  Reviewed  Physical Exam   GEN: No acute distress.   Neck: No JVD Cardiac: RRR, no rubs or gallops. Prominent systolic AS murmur Respiratory: Clear to auscultation bilaterally, except for mildly coarse at bases. GI: Soft, nontender, non-distended  MS: No edema; No deformity. Neuro:  Nonfocal  Psych: Normal affect   Labs    High Sensitivity Troponin:  No results for input(s): TROPONINIHS in the last 720 hours.    Chemistry Recent Labs  Lab 01/16/20 0636 01/16/20 0636 01/17/20 1057 01/17/20 1057 01/18/20 0248 01/19/20 0609 01/20/20 0637  NA 140   < > 139   < > 139 138 137  K 3.9   < > 3.5   < > 4.0 3.3* 3.9  CL 106   < > 104   < > 104 101 103  CO2 24   < > 22   < > 26 28 25   GLUCOSE 99   < > 133*   < > 96 112* 99  BUN 19   < > 17   < > 21 15 12   CREATININE 1.10*   < > 1.09*   < > 1.13* 0.99 0.98  CALCIUM 8.9   < >  8.8*   < > 8.8* 9.0 9.2  PROT 6.3*  --  6.2*  --   --   --   --   ALBUMIN 2.7*  --  2.6*  --   --   --   --   AST 29  --  23  --   --   --   --   ALT 22  --  20  --   --   --   --   ALKPHOS 54  --  51  --   --   --   --   BILITOT 0.9  --  0.5  --   --   --   --   GFRNONAA 46*   < > 47*   < > 45* 52* 53*  GFRAA 53*   < > 54*   < > 52* >60 >60  ANIONGAP 10   < > 13   < > 9 9 9    < > = values in this interval not displayed.     Hematology Recent Labs  Lab 01/18/20 0248 01/19/20 0609 01/20/20 0637  WBC 14.0* 15.7* 13.7*  RBC 3.23* 3.43* 3.61*  HGB 8.2* 8.5* 9.0*  HCT 27.5* 28.7* 30.5*  MCV 85.1 83.7 84.5  MCH 25.4* 24.8* 24.9*  MCHC 29.8* 29.6* 29.5*  RDW 21.6* 21.7* 22.1*  PLT 492* 573* 584*    BNP Recent Labs  Lab 01/16/20 0636  BNP 1,208.3*     DDimer No results for input(s): DDIMER in the last 168 hours.   Radiology    DG CHEST PORT 1 VIEW  Result Date: 01/20/2020 CLINICAL DATA:  Respiratory failure EXAM: PORTABLE CHEST 1 VIEW COMPARISON:  01/17/2020 FINDINGS: Stable cardiomegaly. Atherosclerotic calcification of the aortic knob.  Diffuse interstitial prominence persist throughout both lungs. Streaky left basilar opacity, improving from prior. No new focal airspace consolidation, pleural effusion, or pneumothorax. IMPRESSION: Improving left basilar opacity. Otherwise stable exam. Electronically Signed   By: Davina Poke D.O.   On: 01/20/2020 08:15    Cardiac Studies   Echocardiogram 10/24/19: 1. Left ventricular ejection fraction, by estimation, is 60 to 65%. The  left ventricle has normal function. The left ventricle has no regional  wall motion abnormalities. Left ventricular diastolic function could not  be evaluated. Elevated left  ventricular end-diastolic pressure.  2. Right ventricular systolic function is normal. The right ventricular  size is normal. There is moderately elevated pulmonary artery systolic  pressure.  3. Left atrial size was mildly dilated.  4. The mitral valve is abnormal. Trivial mitral valve regurgitation.  5. The aortic valve is tricuspid. Aortic valve regurgitation is trivial.  Severe aortic valve stenosis. Aortic valve area, by VTI measures 0.76 cm.  Aortic valve mean gradient measures 32.0 mmHg. Aortic valve Vmax measures  3.77 m/s.  6. The inferior vena cava is dilated in size with <50% respiratory  variability, suggesting right atrial pressure of 15 mmHg.   Patient Profile     86 y.o. female with a historyof CAD with prior stenting to LAD, severe aortic stenosis, paroxysmal atrial fibrillation on Eliquis, hypertension, hyperlipidemia, diabetes mellitus, chronic anemia, spinal stenosis with chronic back pain, rheumatoid arthritis on Methotrexate, polymalgia rheumatica on chronic Prednisone, GERD, and vertigowho is being followed in consultation for the evaluation of atrial fibrillation with RVRat the request of Dr. Louanne Belton.Patient admitted with pneumonia and cardiology asked to evaluate for atrial fibrillation.   Assessment & Plan    Paroxysmal atrial fibrillation:   -  much improved symptoms since spontaneous conversion to SR  - Continue metoprolol - Continue apixaban for stroke ppx  Acute diastolic heart failure: -Wt 74.8 kg, was 77.6 kg on admission -Cr stable -appears euvolemic  PNA:  - Continue management per primary team and PCCM  Aortic stenosis: severe on echo 09/2019. Planning for outpatient aortic valve replacement work-up.  - Anticipate outpatient cath once recovered from her PNA.   CAD s/p PCI to LAD: no chest pain complaints. Not on aspirin given need for apixaban - Continue statin and metoprolol - Anticipate outpatient cath once recovered from her PNA for #4  [facility] will sign off.   Medication Recommendations:  No changes Other recommendations (labs, testing, etc):  None Follow up as an outpatient:  We will arrange for outpatient cardiology follow up in several weeks to make sure she is stable for outpatient heart cath for aortic stenosis  For questions or updates, please contact [facility] Please consult [email or web address] for contact info under     Signed, Buford Dresser, MD  01/20/2020, 10:23 AM

## 2020-01-20 NOTE — Care Management Important Message (Signed)
Important Message  Patient Details  Name: Sophia Ward MRN: [id number] Date of Birth: 08-10-1932   Medicare Important Message Given:  Yes     Shelda Altes 01/20/2020, 11:45 AM

## 2020-01-20 NOTE — TOC Transition Note (Addendum)
Transition of Care [facility]) - CM/SW Discharge Note   Patient Details  Name: Sophia Ward MRN: [id number] Date of Birth: 28-Apr-1933  Transition of Care [facility]) CM/SW Contact:  Zenon Mayo, RN Phone Number: 01/20/2020, 12:59 PM   Clinical Narrative:    Patient is for dc home today NCM offered choice, patient states she does not want HHPT she is ok without it.  She has a walker at home. She would like the iron transfusion to be set up for her, she is receiving iron transfusion now. NCM will check into this.   Final next level of care: Home/Self Care Barriers to Discharge: No Barriers Identified   Patient Goals and CMS Choice Patient states their goals for this hospitalization and ongoing recovery are:: get better CMS Medicare.gov Compare Post Acute Care list provided to:: Patient Choice offered to / list presented to : Patient  Discharge Placement                       Discharge Plan and Services                  DME Agency: NA       HH Arranged: Refused HH          Social Determinants of Health (SDOH) Interventions     Readmission Risk Interventions No flowsheet data found.

## 2020-01-21 ENCOUNTER — Inpatient Hospital Stay: Payer: Medicare Other

## 2020-01-21 ENCOUNTER — Telehealth: Payer: Self-pay | Admitting: Rheumatology

## 2020-01-21 NOTE — Telephone Encounter (Signed)
Last Visit: 09/30/2019 Next Visit: 02/05/2020 Labs: 01/20/2020 WBC 13.7, RBC 3.61, Hgb 9.0 Hct 30.5, MCH 24.9, MCHC 29.5 RDW 22.1 Platelets 584, Neutro Abs 8.5, Monocytes Absolute 1.5, Eosinophils Absolute 0.6, Abs Immature 0.31, GFR 53  Patient currently admitted to the hospital. Acute hypoxic respiratory failure due to left lower lobe pneumonia, present on admission, complicated with sepsis. Stage 1 pressure ulcer, present on admission/ buttocks  Do you want to refill MTX or wait? Please advise.

## 2020-01-21 NOTE — Telephone Encounter (Signed)
She should hold off methotrexate until she is completely recovered from the infection and the ulcer is healed.

## 2020-01-21 NOTE — Telephone Encounter (Signed)
Patient advised she should hold off methotrexate until she is completely recovered from the infection and the ulcer is healed.

## 2020-01-21 NOTE — Discharge Summary (Signed)
Sophia Ward EEF:[id number] DOB: 1932-11-08 DOA: 01/13/2020  PCP: [name], MD  Admit date: 01/13/2020  Discharge date: 01/21/2020  Admitted From: Home   disposition: Home   Recommendations for Outpatient Follow-up:   Follow up with PCP in 1-2 weeks  PCP Please obtain BMP/CBC  Home Health: na   Equipment/Devices: na  Consultations: cardiology, pulmonary critical care Discharge Condition: improved CODE STATUS: full   Diet Recommendation: Heart Healthy   Diet Order            Diet - low sodium heart healthy                  Chief Complaint  Patient presents with  . Shortness of Breath  . Fever     Brief history of present illness from the day of admission and additional interim summary    86 year old female with past medical history for hypertension, dyslipidemia, coronary disease, aortic stenosis, paroxysmal atrial fibrillation, type 2 diabetes mellitus and rheumatoid arthritis who presented with cough and dyspnea. She reported 3 days of shortness of breath, associated with wheezing, fever, chills, generalized malaise, nausea, vomiting and diarrhea. On her initial physical examination respiratory rate 19-28, blood pressure 94/46, positive hypoxemia 68% requiring 15 L of nonrebreather to achieve cure 92%. Dry mucous membranes, positive increased work of breathing, heart S1-S2, present, tachycardic, abdomen soft, positive trace lower extremity edema Sodium 139, potassium 3.2, chloride 105, bicarb 16, glucose 226, BUN 18, creatinine 1.36,white count 18.1, hemoglobin 9.1, hematocrit 31.8, platelets 435. Lactate 3.6 SARS COVID-19 negative. Chest radiograph with increased lung markings, positive retrocardiac infiltrate at the left base. EKG 97 bpm, normal axis, normal intervals, sinus rhythm, ST  depressions in lead II, lead III, aVF, V4-V6,no significant T wave changes.  Patient has been placed on antibiotic therapy and diuresis, slowly improving oxygenation.                                                                   Hospital Course    Acute hypoxic respiratory failure due to left lower lobe pneumonia, present on admission, complicated with sepsis.  Patient has done well with treatment of left lower lobe pneumonia and diuresis for decompensated diastolic heart failure. On day of admission she was ambulating the halls without any oxygen and was eager to go home.  Paroxysmal atrial fibrillation with acute on chronic diastolic heart failure/ aortic stenosis. Patient has diuresed well with resolution of pulmonary vascular congestion Atrial fibrillation rate is controlled on metoprolol Coagulations continue with apixaban  AKI with hypokalemia/ hypomagnesemia.  Acute kidney injury resolved with creatinine of 1. Electrolytes were repleted and normalized.  Controlled T2DM Hgb A1c 5,7/ dyslipidemia. Patient was placed back on her premorbid diabetic regimen Continue atorvastatin    Discharge diagnosis     Principal  Problem:   Sepsis due to pneumonia [facility]) Active Problems:   Obesity, unspecified   Rheumatoid arthritis involving multiple sites with positive rheumatoid factor (HCC)   Elevated sed rate   Polymyalgia rheumatica (HCC)   Iron deficiency anemia   Transient hypotension   Acute respiratory failure with hypoxia (HCC)   Pressure injury of skin    Discharge instructions    Discharge Instructions    Diet - low sodium heart healthy   Complete by: As directed    Discharge instructions   Complete by: As directed    1. Please make an appointment to see your PCP in 1 week to make sure you are continuing to do well.   Increase activity slowly   Complete by: As directed    No wound care   Complete by: As directed       Discharge Medications    Allergies as of 01/20/2020      Reactions   Codeine Nausea And Vomiting   MAKES HER SICK   Glimepiride Other (See Comments)   Can't remember   Jardiance [empagliflozin] Other (See Comments)   Yeast   Metformin And Related Diarrhea   Penicillins Rash      Medication List    TAKE these medications   acetaminophen 650 MG CR tablet Commonly known as: TYLENOL Take 1,300 mg by mouth at bedtime.   amLODipine 10 MG tablet Commonly known as: NORVASC Take 1 tablet (10 mg total) by mouth every morning.   apixaban 5 MG Tabs tablet Commonly known as: ELIQUIS Take 1 tablet (5 mg total) by mouth 2 (two) times daily.   atorvastatin 40 MG tablet Commonly known as: LIPITOR Take 1 tablet (40 mg total) by mouth at bedtime.   B-D ULTRAFINE III SHORT PEN 31G X 8 MM Misc Generic drug: Insulin Pen Needle 1 each by Other route in the morning, at noon, in the evening, and at bedtime.   CALCIUM + D PO Take 1 tablet by mouth every morning.   escitalopram 5 MG tablet Commonly known as: LEXAPRO Take 5 mg by mouth daily.   estradiol 0.1 MG/GM vaginal cream Commonly known as: ESTRACE Place 1 Applicatorful vaginally daily as needed (driness).   FISH OIL PO Take 1 capsule by mouth every morning.   folic acid 1 MG tablet Commonly known as: FOLVITE Take 2 tablets (2 mg total) by mouth daily.   HumaLOG KwikPen 100 UNIT/ML KiwkPen Generic drug: insulin lispro Inject 6 Units into the skin 3 (three) times daily.   Levemir FlexTouch 100 UNIT/ML FlexPen Generic drug: insulin detemir Inject 14 Units into the skin every morning.   methotrexate 2.5 MG tablet TAKE 5 TABLETS BY MOUTH ONCE A WEEK. Caution:Chemotherapy. Protect from light. What changed:   how much to take  how to take this  when to take this  additional instructions   metoprolol succinate 100 MG 24 hr tablet Commonly known as: TOPROL-XL Take 1 tablet (100 mg total) by mouth every morning.   nitroGLYCERIN 0.4 MG SL  tablet Commonly known as: NITROSTAT DISSOLVE ONE TABLET UNDER THE TONGUE EVERY 5 MINUTES AS NEEDED FOR CHEST PAIN.  DO NOT EXCEED A TOTAL OF 3 DOSES IN 15 MINUTES   pantoprazole 40 MG tablet Commonly known as: PROTONIX Take 1 tablet (40 mg total) by mouth daily after lunch.   predniSONE 1 MG tablet Commonly known as: DELTASONE Take 4 tablets (4 mg total) by mouth daily with breakfast.   ramipril 10 MG tablet Commonly  known as: ALTACE Take 10 mg by mouth 2 (two) times daily.   Vitamin D3 125 MCG (5000 UT) Caps Take 5,000 Units by mouth daily.        Follow-up Information    [name], MD. Go on 01/30/2020.   Specialty: Internal Medicine Why: @10 :45am Contact information: [address] [place] [address] [phone]               Major procedures and Radiology Reports - PLEASE review detailed and final reports thoroughly  -        DG CHEST PORT 1 VIEW  Result Date: 01/20/2020 CLINICAL DATA:  Respiratory failure EXAM: PORTABLE CHEST 1 VIEW COMPARISON:  01/17/2020 FINDINGS: Stable cardiomegaly. Atherosclerotic calcification of the aortic knob. Diffuse interstitial prominence persist throughout both lungs. Streaky left basilar opacity, improving from prior. No new focal airspace consolidation, pleural effusion, or pneumothorax. IMPRESSION: Improving left basilar opacity. Otherwise stable exam. Electronically Signed   By: Davina Poke D.O.   On: 01/20/2020 08:15   DG CHEST PORT 1 VIEW  Result Date: 01/17/2020 CLINICAL DATA:  Shortness of breath and fever EXAM: PORTABLE CHEST 1 VIEW COMPARISON:  01/13/2020, 10/27/2019, 11/07/2016 FINDINGS: Cardiomegaly with mild central vascular congestion. Probable small left effusion. Streaky airspace opacity at the left base. Aortic atherosclerosis. No pneumothorax. Patchy peripheral opacities are slightly improved. IMPRESSION: 1. Cardiomegaly with mild central congestion. 2. Probable small left effusion. 3.  Partial resolution of streaky and patchy left greater than right pulmonary opacities, possible bilateral pneumonia Electronically Signed   By: Donavan Foil M.D.   On: 01/17/2020 17:07   DG Chest Port 1 View  Result Date: 01/13/2020 CLINICAL DATA:  Shortness of breath EXAM: PORTABLE CHEST 1 VIEW COMPARISON:  10/27/2018 FINDINGS: Borderline heart size. Bilateral septal thickening and patchy airspace type opacity. No visible effusion or air leak. Atherosclerosis. IMPRESSION: Patchy pulmonary opacity, favor atypical pneumonia over CHF. Electronically Signed   By: Monte Fantasia M.D.   On: 01/13/2020 05:38    Micro Results    Recent Results (from the past 240 hour(s))  Urine culture     Status: Abnormal   Collection Time: 01/13/20  5:26 AM   Specimen: In/Out Cath Urine  Result Value Ref Range Status   Specimen Description IN/OUT CATH URINE  Final   Special Requests   Final    NONE Performed at [facility], [address], [place], [place] [address]    Culture >=100,000 COLONIES/mL ESCHERICHIA COLI (A)  Final   Report Status 01/15/2020 FINAL  Final   Organism ID, Bacteria ESCHERICHIA COLI (A)  Final      Susceptibility   Escherichia coli - MIC*    AMPICILLIN 4 SENSITIVE Sensitive     CEFAZOLIN <=4 SENSITIVE Sensitive     CEFTRIAXONE <=0.25 SENSITIVE Sensitive     CIPROFLOXACIN <=0.25 SENSITIVE Sensitive     GENTAMICIN <=1 SENSITIVE Sensitive     IMIPENEM <=0.25 SENSITIVE Sensitive     NITROFURANTOIN <=16 SENSITIVE Sensitive     TRIMETH/SULFA <=20 SENSITIVE Sensitive     AMPICILLIN/SULBACTAM <=2 SENSITIVE Sensitive     PIP/TAZO <=4 SENSITIVE Sensitive     * >=100,000 COLONIES/mL ESCHERICHIA COLI  Blood Culture (routine x 2)     Status: None   Collection Time: 01/13/20  5:26 AM   Specimen: BLOOD RIGHT ARM  Result Value Ref Range Status   Specimen Description BLOOD RIGHT ARM  Final   Special Requests   Final  BOTTLES DRAWN AEROBIC AND ANAEROBIC BLOOD RIGHT ARM   Culture    Final    NO GROWTH 5 DAYS Performed at [facility], [place] [address], [place], [place] [address]    Report Status 01/18/2020 FINAL  Final  Blood Culture (routine x 2)     Status: None   Collection Time: 01/13/20  5:31 AM   Specimen: BLOOD LEFT ARM  Result Value Ref Range Status   Specimen Description BLOOD LEFT ARM  Final   Special Requests   Final    BOTTLES DRAWN AEROBIC AND ANAEROBIC Blood Culture results may not be optimal due to an inadequate volume of blood received in culture bottles   Culture   Final    NO GROWTH 5 DAYS Performed at [facility], [place] [address] [place], [place] [address]    Report Status 01/18/2020 FINAL  Final  SARS Coronavirus 2 by RT PCR (hospital order, performed in [facility] hospital lab) Nasopharyngeal Nasopharyngeal Swab     Status: None   Collection Time: 01/13/20  6:44 AM   Specimen: Nasopharyngeal Swab  Result Value Ref Range Status   SARS Coronavirus 2 NEGATIVE NEGATIVE Final    Comment: (NOTE) SARS-CoV-2 target nucleic acids are NOT DETECTED.  The SARS-CoV-2 RNA is generally detectable in upper and lower respiratory specimens during the acute phase of infection. The lowest concentration of SARS-CoV-2 viral copies this assay can detect is 250 copies / mL. A negative result does not preclude SARS-CoV-2 infection and should not be used as the sole basis for treatment or other patient management decisions.  A negative result may occur with improper specimen collection / handling, submission of specimen other than nasopharyngeal swab, presence of viral mutation(s) within the areas targeted by this assay, and inadequate number of viral copies (<250 copies / mL). A negative result must be combined with clinical observations, patient history, and epidemiological information.  Fact Sheet for Patients:   StrictlyIdeas.no  Fact Sheet for Healthcare Providers: BankingDealers.co.za  This  test is not yet approved or  cleared by the [place] FDA and has been authorized for detection and/or diagnosis of SARS-CoV-2 by FDA under an Emergency Use Authorization (EUA).  This EUA will remain in effect (meaning this test can be used) for the duration of the COVID-19 declaration under Section 564(b)(1) of the Act, 21 U.S.C. section 360bbb-3(b)(1), unless the authorization is terminated or revoked sooner.  Performed at [facility], [place] [address], [place], [place] [address]   Respiratory Panel by PCR     Status: None   Collection Time: 01/14/20  5:54 PM   Specimen: Nasopharyngeal Swab; Respiratory  Result Value Ref Range Status   Adenovirus NOT DETECTED NOT DETECTED Final   Coronavirus 229E NOT DETECTED NOT DETECTED Final    Comment: (NOTE) The Coronavirus on the Respiratory Panel, DOES NOT test for the novel  Coronavirus (2019 nCoV)    Coronavirus HKU1 NOT DETECTED NOT DETECTED Final   Coronavirus NL63 NOT DETECTED NOT DETECTED Final   Coronavirus OC43 NOT DETECTED NOT DETECTED Final   Metapneumovirus NOT DETECTED NOT DETECTED Final   Rhinovirus / Enterovirus NOT DETECTED NOT DETECTED Final   Influenza A NOT DETECTED NOT DETECTED Final   Influenza B NOT DETECTED NOT DETECTED Final   Parainfluenza Virus 1 NOT DETECTED NOT DETECTED Final   Parainfluenza Virus 2 NOT DETECTED NOT DETECTED Final   Parainfluenza Virus 3 NOT DETECTED NOT DETECTED Final   Parainfluenza Virus 4 NOT  DETECTED NOT DETECTED Final   Respiratory Syncytial Virus NOT DETECTED NOT DETECTED Final   Bordetella pertussis NOT DETECTED NOT DETECTED Final   Chlamydophila pneumoniae NOT DETECTED NOT DETECTED Final   Mycoplasma pneumoniae NOT DETECTED NOT DETECTED Final    Comment: Performed at [facility], [place] [address], [place], [place] [address]    Today   Subjective    Sophia Ward feels much improved since admission.  Feels ready to go home.  Denies chest pain, shortness of breath  or abdominal pain.  Feels they can take care of themselves with the resources they have at home.  Objective   Blood pressure (!) 130/105, pulse 64, temperature 98.6 F (37 C), temperature source Oral, resp. rate 12, height 5\' 2"  (1.575 m), weight 74.8 kg, SpO2 96 %.  No intake or output data in the 24 hours ending 01/21/20 1931  Exam General: Patient appears well and in good spirits sitting up in bed in no acute distress.  Eyes: sclera anicteric, conjuctiva mild injection bilaterally CVS: S1-S2, regular  Respiratory:  decreased air entry bilaterally secondary to decreased inspiratory effort, rales at bases  GI: NABS, soft, NT  LE: No edema.  Neuro: A/O x 3, Moving all extremities equally with normal strength, CN 3-12 intact, grossly nonfocal.  Psych: patient is logical and coherent, judgement and insight appear normal, mood and affect appropriate to situation.    Data Review   CBC w Diff:  Lab Results  Component Value Date   WBC 13.7 (H) 01/20/2020   HGB 9.0 (L) 01/20/2020   HGB 8.8 (L) 01/06/2020   HGB 7.2 (L) 10/22/2019   HCT 30.5 (L) 01/20/2020   PLT 584 (H) 01/20/2020   PLT 311 01/06/2020   LYMPHOPCT 19 01/20/2020   MONOPCT 11 01/20/2020   EOSPCT 5 01/20/2020   BASOPCT 1 01/20/2020    CMP:  Lab Results  Component Value Date   NA 137 01/20/2020   K 3.9 01/20/2020   CL 103 01/20/2020   CO2 25 01/20/2020   BUN 12 01/20/2020   CREATININE 0.98 01/20/2020   CREATININE 1.03 (H) 01/06/2020   CREATININE 0.95 (H) 09/30/2019   PROT 6.2 (L) 01/17/2020   ALBUMIN 2.6 (L) 01/17/2020   BILITOT 0.5 01/17/2020   BILITOT 0.4 01/06/2020   ALKPHOS 51 01/17/2020   AST 23 01/17/2020   AST 10 (L) 01/06/2020   ALT 20 01/17/2020   ALT 9 01/06/2020  .   Total Time in preparing paper work, data evaluation and todays exam - 35 minutes  Vashti Hey M.D on 01/21/2020 at 7:31 PM  [facility]   Office  [phone]

## 2020-01-21 NOTE — Telephone Encounter (Signed)
Patient requesting a refill on MTX sent to [facility] in [facility].

## 2020-01-22 NOTE — Progress Notes (Signed)
Office Visit Note  Patient: Sophia Ward             Date of Birth: March 22, 1933           MRN: [id number]             PCP: [name], MD Referring: [name], MD Visit Date: 02/05/2020 Occupation: @GUAROCC @  Subjective:  Other (patient reports recent hospitalization. patient has been off of MTX for 3 weeks )   History of Present Illness: Sophia Ward is a 86 y.o. female with history of rheumatoid arthritis. She states she was recently hospitalized due to anemia and had blood transfusion. After that at the end of August she was hospitalized with pneumonia and urinary tract infection. She stated that she was treated with antibiotics and finally recovered. She has a follow-up appointment with her PCP tomorrow. She states she does not have any UTI symptoms currently. She denies any chest pain or shortness of breath. She is also seeing a cardiologist currently. She is on prednisone 4 mg p.o. daily for polymyalgia rheumatica. She is decreasing it by 1 mg every month.  Activities of Daily Living:  Patient reports morning stiffness for 3 hours.   Patient Denies nocturnal pain.  Difficulty dressing/grooming: Denies Difficulty climbing stairs: Reports Difficulty getting out of chair: Reports Difficulty using hands for taps, buttons, cutlery, and/or writing: Reports  Review of Systems  Constitutional: Positive for fatigue.  HENT: Positive for mouth dryness. Negative for mouth sores and nose dryness.   Eyes: Negative for itching and dryness.  Respiratory: Positive for shortness of breath. Negative for difficulty breathing.   Cardiovascular: Negative for chest pain and palpitations.  Gastrointestinal: Negative for blood in stool, constipation and diarrhea.  Endocrine: Negative for increased urination.  Genitourinary: Negative for difficulty urinating.  Musculoskeletal: Positive for arthralgias, joint pain and morning stiffness. Negative for joint swelling, myalgias, muscle  tenderness and myalgias.  Skin: Negative for color change, rash and redness.  Neurological: Positive for weakness. Negative for dizziness, numbness, headaches and memory loss.  Hematological: Positive for bruising/bleeding tendency.  Psychiatric/Behavioral: Negative for confusion.    PMFS History:  Patient Active Problem List   Diagnosis Date Noted  . Nonrheumatic aortic valve stenosis   . Pressure injury of skin 01/15/2020  . Sepsis due to pneumonia ([place]) 01/13/2020  . Transient hypotension 01/13/2020  . Acute respiratory failure with hypoxia ([place]) 01/13/2020  . Iron deficiency anemia 01/06/2020  . Symptomatic anemia 10/23/2019  . Anemia 10/22/2019  . Polymyalgia rheumatica ([place]) 11/25/2016  . Primary osteoarthritis of both hands 11/25/2016  . Bilateral primary osteoarthritis of knee 11/25/2016  . Osteopenia of multiple sites 11/25/2016  . 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 Osteoarthritis of lumbar spine 10/28/2016  . History of diabetes mellitus 10/27/2016  . History of gastroesophageal reflux (GERD) 10/27/2016  . Rheumatoid arthritis involving multiple sites with positive rheumatoid factor ([place]) 10/14/2016  . Elevated C-reactive protein (CRP) 10/14/2016  . Elevated sed rate 10/14/2016  . High risk medication use 10/14/2016  . Coronary atherosclerosis of native coronary artery 01/30/2014  . Mixed hyperlipidemia 01/30/2014  . Essential hypertension, benign 01/30/2014  . Obesity, unspecified 01/30/2014    Past Medical History:  Diagnosis Date  . Allergic rhinitis   . Anemia 09/2019  . Arthritis    hands  . BPPV (benign paroxysmal positional vertigo)   . Coronary artery disease   . Coronary atherosclerosis of native coronary artery 01/30/2014  . Diabetes mellitus without complication (  [place])   . Essential hypertension, benign 01/30/2014  . GERD (gastroesophageal reflux disease)   . Hypertension   . Mixed hyperlipidemia 01/30/2014  . Myocardial infarction  ([place]) 2004   mild MI-no damage- had stents  . Osteopenia   . Post-menopausal   . Rheumatoid arthritis ([place])   . Spinal stenosis   . Stenosing tenosynovitis     Family History  Problem Relation Age of Onset  . CAD Mother   . Heart attack Mother   . CAD Father   . Heart Problems Brother        open heart surgery   . Arthritis Daughter        psoriatic arthritis    Past Surgical History:  Procedure Laterality Date  . ABDOMINAL HYSTERECTOMY    . APPENDECTOMY    . BIOPSY  10/25/2019   Procedure: BIOPSY;  Surgeon: Otis Brace, MD;  Location: [facility];  Service: Gastroenterology;;  . BIOPSY  10/26/2019   Procedure: BIOPSY;  Surgeon: Otis Brace, MD;  Location: [facility];  Service: Gastroenterology;;  . CARDIAC CATHETERIZATION  2004  . carpel tunnel    . COLONOSCOPY WITH PROPOFOL N/A 10/30/2012   Procedure: COLONOSCOPY WITH PROPOFOL;  Surgeon: Garlan Fair, MD;  Location: [facility];  Service: Endoscopy;  Laterality: N/A;  . COLONOSCOPY WITH PROPOFOL N/A 10/26/2019   Procedure: COLONOSCOPY WITH PROPOFOL;  Surgeon: Otis Brace, MD;  Location: [facility];  Service: Gastroenterology;  Laterality: N/A;  . CORONARY ANGIOPLASTY  2004  . DILATION AND CURETTAGE OF UTERUS    . ESOPHAGOGASTRODUODENOSCOPY N/A 10/25/2019   Procedure: ESOPHAGOGASTRODUODENOSCOPY (EGD);  Surgeon: Otis Brace, MD;  Location[facility];  Service: Gastroenterology;  Laterality: N/A;  . ESOPHAGOGASTRODUODENOSCOPY (EGD) WITH PROPOFOL N/A 10/30/2012   Procedure: ESOPHAGOGASTRODUODENOSCOPY (EGD) WITH PROPOFOL;  Surgeon: Garlan Fair, MD;  Location: [facility];  Service: Endoscopy;  Laterality: N/A;  . EYE SURGERY     bilateral cataract with lens implant  . EYE SURGERY    . INJECTION OF SILICONE OIL Left 3/47/4259   Procedure: Injection Of Silicone Oil;  Surgeon: Jalene Mullet, MD;  Location: [facility];  Service: Ophthalmology;  Laterality: Left;  . INTRAVASCULAR PRESSURE WIRE/FFR  STUDY N/A 01/29/2020   Procedure: INTRAVASCULAR PRESSURE WIRE/FFR STUDY;  Surgeon: Jettie Booze, MD;  Location: [facility];  Service: Cardiovascular;  Laterality: N/A;  . left shouler surgery    . PHOTOCOAGULATION WITH LASER Left 02/12/2019   Procedure: Photocoagulation With Laser;  Surgeon: Jalene Mullet, MD;  Location: [facility];  Service: Ophthalmology;  Laterality: Left;  . 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 02/12/2019   Procedure: REPAIR OF COMPLEX TRACTION RETINAL DETACHMENT;  Surgeon: Jalene Mullet, MD;  Location: [facility];  Service: Ophthalmology;  Laterality: Left;  . RIGHT/LEFT HEART CATH AND CORONARY ANGIOGRAPHY N/A 01/29/2020   Procedure: RIGHT/LEFT HEART CATH AND CORONARY ANGIOGRAPHY;  Surgeon: Jettie Booze, MD;  Location: [facility];  Service: Cardiovascular;  Laterality: N/A;  . TONSILLECTOMY    . VITRECTOMY 25 GAUGE WITH SCLERAL BUCKLE Left 02/12/2019   Procedure: Vitrectomy 25 Gauge With Scleral Buckle;  Surgeon: Jalene Mullet, MD;  Location: [facility];  Service: Ophthalmology;  Laterality: Left;   Social History   Social History Narrative  . Not on file   Immunization History  Administered Date(s) Administered  . Influenza, High Dose Seasonal PF 03/09/2014, 04/09/2015, 03/16/2016, 02/25/2017, 03/20/2018  . PFIZER SARS-COV-2 Vaccination 06/11/2019, 07/01/2019  . Zoster Recombinat (Shingrix) 02/07/2018, 06/04/2018  Objective: Vital Signs: BP 114/65 (BP Location: Right Arm, Patient Position: Sitting, Cuff Size: Large)   Pulse 66   Resp 16   Ht 5\' 2"  (1.575 m)   Wt 173 lb 4.8 oz (78.6 kg)   BMI 31.70 kg/m    Physical Exam Vitals and nursing note reviewed.  Constitutional:      Appearance: She is well-developed.  HENT:     Head: Normocephalic and atraumatic.  Eyes:     Conjunctiva/sclera: Conjunctivae normal.  Cardiovascular:     Rate and Rhythm: Normal rate and regular rhythm.     Heart sounds: Normal heart sounds.    Pulmonary:     Effort: Pulmonary effort is normal.     Breath sounds: Normal breath sounds.  Abdominal:     General: Bowel sounds are normal.     Palpations: Abdomen is soft.  Musculoskeletal:     Cervical back: Normal range of motion.  Lymphadenopathy:     Cervical: No cervical adenopathy.  Skin:    General: Skin is warm and dry.     Capillary Refill: Capillary refill takes less than 2 seconds.  Neurological:     Mental Status: She is alert and oriented to person, place, and time.  Psychiatric:        Behavior: Behavior normal.      Musculoskeletal Exam: C-spine was in good range of motion. Shoulder joints, elbow joints, wrist joints with good range of motion with no synovitis. She has mild PIP and DIP thickening. Hip joints and knee joints were in good range of motion. She had no tenderness across MTPs.  CDAI Exam: CDAI Score: 0.6  Patient Global: 3 mm; Provider Global: 3 mm Swollen: 0 ; Tender: 0  Joint Exam 02/05/2020   No joint exam has been documented for this visit   There is currently no information documented on the homunculus. Go to the Rheumatology activity and complete the homunculus joint exam.  Investigation: No additional findings.  Imaging: CARDIAC CATHETERIZATION  Addendum Date: 01/29/2020    Ost LAD to Prox LAD lesion is 70% stenosed. Significant by DFR, 0.74.  Distal area of Previously placed Mid LAD stent (unknown type) is widely patent.  Dist LM to Ost LAD lesion is 25% stenosed.  Ost RCA to Prox RCA lesion is 75% stenosed.  LV end diastolic pressure is mildly elevated.  Hemodynamic findings consistent with moderate pulmonary hypertension.  Ao 90%, PA sat 62%, CO 6.9 L/min; CI 3.88; PA pressure 58/16, mean PA pressure 37 mm Hg; mean PCWP 22- significant V waves noted on wedge tracing  Severe proximal, two vessel disease.  Ostial RCA is favorable for PCI.  Proximal LAD would be more technically challenging. Restrictive physiology likely given V waves.  Plan for surgical consult to decide about CABG/AVR vs PCI/TAVR. Case d/w Dr. Burt Knack as well.   Result Date: 01/29/2020  Ost LAD to Prox LAD lesion is 70% stenosed.  Previously placed Mid LAD stent (unknown type) is widely patent.  Prox LAD lesion is 70% stenosed.  Dist LM to Ost LAD lesion is 25% stenosed.  Ost RCA to Prox RCA lesion is 75% stenosed.  LV end diastolic pressure is mildly elevated.  Hemodynamic findings consistent with moderate pulmonary hypertension.  Ao 90%, PA sat 62%, CO 6.9 L/min; CI 3.88; PA pressure 58/16, mean PA pressure 37 mm Hg; mean PCWP 22- significant V waves noted on wedge tracing  Severe proximal, two vessel disease.  Ostial RCA is favorable for PCI.  Proximal  LAD would be more technically challenging. Restrictive physiology likely given V waves. Plan for surgical consult to decide about CABG/AVR vs PCI/TAVR. Case d/w Dr. Burt Knack as well.   DG CHEST PORT 1 VIEW  Result Date: 01/20/2020 CLINICAL DATA:  Respiratory failure EXAM: PORTABLE CHEST 1 VIEW COMPARISON:  01/17/2020 FINDINGS: Stable cardiomegaly. Atherosclerotic calcification of the aortic knob. Diffuse interstitial prominence persist throughout both lungs. Streaky left basilar opacity, improving from prior. No new focal airspace consolidation, pleural effusion, or pneumothorax. IMPRESSION: Improving left basilar opacity. Otherwise stable exam. Electronically Signed   By: Davina Poke D.O.   On: 01/20/2020 08:15   DG CHEST PORT 1 VIEW  Result Date: 01/17/2020 CLINICAL DATA:  Shortness of breath and fever EXAM: PORTABLE CHEST 1 VIEW COMPARISON:  01/13/2020, 10/27/2019, 11/07/2016 FINDINGS: Cardiomegaly with mild central vascular congestion. Probable small left effusion. Streaky airspace opacity at the left base. Aortic atherosclerosis. No pneumothorax. Patchy peripheral opacities are slightly improved. IMPRESSION: 1. Cardiomegaly with mild central congestion. 2. Probable small left effusion. 3. Partial  resolution of streaky and patchy left greater than right pulmonary opacities, possible bilateral pneumonia Electronically Signed   By: Donavan Foil M.D.   On: 01/17/2020 17:07   DG Chest Port 1 View  Result Date: 01/13/2020 CLINICAL DATA:  Shortness of breath EXAM: PORTABLE CHEST 1 VIEW COMPARISON:  10/27/2018 FINDINGS: Borderline heart size. Bilateral septal thickening and patchy airspace type opacity. No visible effusion or air leak. Atherosclerosis. IMPRESSION: Patchy pulmonary opacity, favor atypical pneumonia over CHF. Electronically Signed   By: Monte Fantasia M.D.   On: 01/13/2020 05:38    Recent Labs: Lab Results  Component Value Date   WBC 13.7 (H) 01/20/2020   HGB 10.2 (L) 01/29/2020   PLT 584 (H) 01/20/2020   NA 142 01/29/2020   K 3.9 01/29/2020   CL 103 01/20/2020   CO2 25 01/20/2020   GLUCOSE 99 01/20/2020   BUN 12 01/20/2020   CREATININE 0.98 01/20/2020   BILITOT 0.5 01/17/2020   ALKPHOS 51 01/17/2020   AST 23 01/17/2020   ALT 20 01/17/2020   PROT 6.2 (L) 01/17/2020   ALBUMIN 2.6 (L) 01/17/2020   CALCIUM 9.2 01/20/2020   GFRAA >60 01/20/2020    Speciality Comments: No specialty comments available.  Procedures:  No procedures performed Allergies: Codeine, Glimepiride, Jardiance [empagliflozin], Metformin and related, and Penicillins   Assessment / Plan:     Visit Diagnoses: Rheumatoid arthritis involving multiple sites with positive rheumatoid factor (HCC) -  RF 309, ANA negative, CRP 5.8, synovitis, treated by Dr. Philbert Riser in [place] during the winter months: She had no synovitis on my examination. She has been off methotrexate due to pneumonia and urinary tract infection. She was recently hospitalized with pneumonia and UTI. She is taking prednisone 4 mg p.o. daily currently. She is aware of tapering it by 1 mg every month. I would like a clearance from her PCP about pneumonia and UTI before resuming methotrexate.  High risk medication use - Methotrexate  2.5 mg 5 tablet every 7 days has been on hold due to infection., folic acid 1 mg 2 tablets daily, and prednisone 4 mg po daily. Her labs were normal in August except for anemia. We will continue to monitor labs every 3 months after receiving methotrexate.  Polymyalgia rheumatica (HCC)-she had no increased muscle weakness or tenderness.  Primary osteoarthritis of both hands-joint protection was discussed.  Primary osteoarthritis of both knees-she continues to have some stiffness in her knee joints but no  swelling was noted.  Primary osteoarthritis of both feet-use of proper fitting shoes was discussed.  Trochanteric bursitis, right hip-resolved.  Osteopenia of multiple sites - DEXA 05/15/18: The BMD measured at Femur Neck Left is 0.820 g/cm2 with a T-score of -1.6. Patient states she will be getting repeat DEXA through her PCP.  Other medical problems are listed as follows:  History of hyperlipidemia  History of hypertension  History of diabetes mellitus  History of gastroesophageal reflux (GERD)  Educated about COVID-19 virus infection-detailed information was given regarding COVID-19 booster and precautions. All the instructions were placed in the AVS.  Orders: No orders of the defined types were placed in this encounter.  No orders of the defined types were placed in this encounter.    Follow-Up Instructions: Return in about 3 months (around 05/06/2020) for Rheumatoid arthritis, Osteoarthritis.   Bo Merino, MD  Note - This record has been created using Editor, commissioning.  Chart creation errors have been sought, but may not always  have been located. Such creation errors do not reflect on  the standard of medical care.

## 2020-01-23 NOTE — H&P (View-Only) (Signed)
Cardiology Office Note   Date:  01/24/2020   ID:  Sophia Ward, DOB 21-Dec-1932, MRN [id number]  PCP:  Sophia Low, MD    No chief complaint on file.  CAD/AS  Wt Readings from Last 3 Encounters:  01/24/20 169 lb 9.6 oz (76.9 kg)  01/20/20 165 lb (74.8 kg)  01/06/20 171 lb 11.2 oz (77.9 kg)       History of Present Illness: Sophia Ward is a 86 y.o. female  Withh/o CAD. She had an LAD stent placed in 2004 (drug-eluting stent for restenosis) and has done well since then.  Her PLavix was stopped due to anemiain the past. Plavix was restartedlaterafter anemia was stable.  She haschronic back problems and has received injections in the past.  She has been intolerant of metformin in the pastdue to diarrhea.  In [name] 2018, she was in the hospital in [place] and was diagnosed with PMR. She was later found to have rheumatoid arthritis. MTX was started. Prednisone is being tapered gradually.  She has chronic back pain treated with injections. She travels to [place] in the winter time.   In May 2019, she had a PMR flare. She is on chronic Prednisone.   Hospital records from 5/21 showed: "Symptomatic microcytic anemia Significant drop in hemoglobin to around 7 noted from her usual baseline of 12-13. Patient was transfused 1 unit of blood due to her symptoms. She did notice dark to black-colored stool recently. Denies any fresh red blood per rectum. She is on Plavix for her heart disease. Last colonoscopy was in 2014. No concerning findings were noted per patient. Anemia panel shows iron deficiency. CT abdomen showed diverticulosis. No other concerning findings noted. Patient seen by gastroenterology. Initially plan was for upper endoscopy on 5/27 however due to electrolyte abnormalities and new onset of atrial fibrillation this was postponed to the following day. She underwent upper endoscopy. No bleeding lesions were noted. White  speckled mucosa was noted in the esophagus. Subsequently she underwent colonoscopy. Few ulcers were noted in the terminal ileum. No active bleeding was noted. Cleared by gastroenterology to start anticoagulation. Follow-up in their office in 2 to 4 weeks. PPI.  Paroxysmal atrial fibrillation This is a new diagnosis for her. She has had palpitations on and off for the past 2 months. TSH was 2.15. Echocardiogram shows normal systolic function. Trivial mitral regurgitation noted. However more significantly has severe aortic stenosis noted.  Patient was started back on her beta-blocker. She converted to sinus rhythm. Electrolytes have been corrected. Her chads 2 vascular score is 6. She is high risk for thromboembolism. Anticoagulation was discussed with her. She is agreeable to start Eliquis if GI has no objection.Patient was started on Eliquis. All of this was also communicated to her primary cardiologist, Dr. Irish Lack.  Severe aortic stenosis She had moderate aortic stenosis when echocardiogram was done in 2019. Seems to have progressedbased on echocardiogram done during this hospital stay.. Now she also has been found to have atrial fibrillation as discussed above. She will need to see her primary cardiologist, Dr. Irish Lack, in the outpatient setting. Discussed with him. He agrees with plans for anticoagulation. Patient will need repeat echocardiogram in the next few months."  Hospitalized with pneumonia in 12/2018.  Plan was for left heart catheterization once she recovered from pneumonia.  Hgb stayed in the 9 range.  She is feeling better since being in the hospital.  [facility] had some AFib, but converted to NSR.  SHe also had IV  iron while in the hospital.   She felt that the Lasix helped her as well.  She does not feel volume overloaded recently.  Weight has been stable.     Past Medical History:  Diagnosis Date  . Allergic rhinitis   . Anemia 09/2019  . Arthritis     hands  . BPPV (benign paroxysmal positional vertigo)   . Coronary artery disease   . Coronary atherosclerosis of native coronary artery 01/30/2014  . Diabetes mellitus without complication ([place])   . Essential hypertension, benign 01/30/2014  . GERD (gastroesophageal reflux disease)   . Hypertension   . Mixed hyperlipidemia 01/30/2014  . Myocardial infarction ([place]) 2004   mild MI-no damage- had stents  . Osteopenia   . Post-menopausal   . Rheumatoid arthritis ([place])   . Spinal stenosis   . Stenosing tenosynovitis     Past Surgical History:  Procedure Laterality Date  . ABDOMINAL HYSTERECTOMY    . APPENDECTOMY    . BIOPSY  10/25/2019   Procedure: BIOPSY;  Surgeon: Otis Brace, MD;  Location: [place];  Service: Gastroenterology;;  . BIOPSY  10/26/2019   Procedure: BIOPSY;  Surgeon: Otis Brace, MD;  Location: [place] [facility];  Service: Gastroenterology;;  . CARDIAC CATHETERIZATION  2004  . carpel tunnel    . COLONOSCOPY WITH PROPOFOL N/A 10/30/2012   Procedure: COLONOSCOPY WITH PROPOFOL;  Surgeon: Garlan Fair, MD;  Location: [facility];  Service: Endoscopy;  Laterality: N/A;  . COLONOSCOPY WITH PROPOFOL N/A 10/26/2019   Procedure: COLONOSCOPY WITH PROPOFOL;  Surgeon: Otis Brace, MD;  Location: [place];  Service: Gastroenterology;  Laterality: N/A;  . CORONARY ANGIOPLASTY  2004  . DILATION AND CURETTAGE OF UTERUS    . ESOPHAGOGASTRODUODENOSCOPY N/A 10/25/2019   Procedure: ESOPHAGOGASTRODUODENOSCOPY (EGD);  Surgeon: Otis Brace, MD;  Location: [facility] ENDOSCOPY;  Service: Gastroenterology;  Laterality: N/A;  . ESOPHAGOGASTRODUODENOSCOPY (EGD) WITH PROPOFOL N/A 10/30/2012   Procedure: ESOPHAGOGASTRODUODENOSCOPY (EGD) WITH PROPOFOL;  Surgeon: Garlan Fair, MD;  Location: [facility];  Service: Endoscopy;  Laterality: N/A;  . EYE SURGERY     bilateral cataract with lens implant  . EYE SURGERY    . INJECTION OF SILICONE OIL Left 5/00/9381   Procedure:  Injection Of Silicone Oil;  Surgeon: Jalene Mullet, MD;  Location: [place];  Service: Ophthalmology;  Laterality: Left;  . left shouler surgery    . PHOTOCOAGULATION WITH LASER Left 02/12/2019   Procedure: Photocoagulation With Laser;  Surgeon: Jalene Mullet, MD;  Location: [place];  Service: Ophthalmology;  Laterality: Left;  . 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 02/12/2019   Procedure: REPAIR OF COMPLEX TRACTION RETINAL DETACHMENT;  Surgeon: Jalene Mullet, MD;  Location: [place];  Service: Ophthalmology;  Laterality: Left;  . TONSILLECTOMY    . VITRECTOMY 25 GAUGE WITH SCLERAL BUCKLE Left 02/12/2019   Procedure: Vitrectomy 25 Gauge With Scleral Buckle;  Surgeon: Jalene Mullet, MD;  Location: [place];  Service: Ophthalmology;  Laterality: Left;     Current Outpatient Medications  Medication Sig Dispense Refill  . acetaminophen (TYLENOL) 650 MG CR tablet Take 1,300 mg by mouth at bedtime.    Marland Kitchen amLODipine (NORVASC) 10 MG tablet Take 1 tablet (10 mg total) by mouth every morning. 90 tablet 3  . apixaban (ELIQUIS) 5 MG TABS tablet Take 1 tablet (5 mg total) by mouth 2 (two) times daily. 180 tablet 1  . atorvastatin (LIPITOR) 40 MG tablet Take 1 tablet (40 mg total) by mouth at bedtime. 90 tablet 3  .  B-D ULTRAFINE III SHORT PEN 31G X 8 MM MISC 1 each by Other route in the morning, at noon, in the evening, and at bedtime.     . Calcium Carbonate-Vitamin D (CALCIUM + D PO) Take 1 tablet by mouth every morning.    . Cholecalciferol (VITAMIN D3) 125 MCG (5000 UT) CAPS Take 5,000 Units by mouth daily.     Marland Kitchen escitalopram (LEXAPRO) 5 MG tablet Take 5 mg by mouth daily.    Marland Kitchen estradiol (ESTRACE) 0.1 MG/GM vaginal cream Place 1 Applicatorful vaginally daily as needed (driness).     . folic acid (FOLVITE) 1 MG tablet Take 2 tablets (2 mg total) by mouth daily. 180 tablet 2  . insulin lispro (HUMALOG KWIKPEN) 100 UNIT/ML KiwkPen Inject 6 Units into the skin 3 (three) times daily.     Marland Kitchen LEVEMIR  FLEXTOUCH 100 UNIT/ML Pen Inject 14 Units into the skin every morning.   0  . methotrexate 2.5 MG tablet TAKE 5 TABLETS BY MOUTH ONCE A WEEK. Caution:Chemotherapy. Protect from light. (Patient taking differently: Take 12.5 mg by mouth every Wednesday. ) 60 tablet 0  . metoprolol succinate (TOPROL-XL) 100 MG 24 hr tablet Take 1 tablet (100 mg total) by mouth every morning. 90 tablet 3  . nitroGLYCERIN (NITROSTAT) 0.4 MG SL tablet DISSOLVE ONE TABLET UNDER THE TONGUE EVERY 5 MINUTES AS NEEDED FOR CHEST PAIN.  DO NOT EXCEED A TOTAL OF 3 DOSES IN 15 MINUTES 25 tablet 5  . Omega-3 Fatty Acids (FISH OIL PO) Take 1 capsule by mouth every morning.    . pantoprazole (PROTONIX) 40 MG tablet Take 1 tablet (40 mg total) by mouth daily after lunch. 30 tablet 1  . predniSONE (DELTASONE) 1 MG tablet Take 4 tablets (4 mg total) by mouth daily with breakfast. 360 tablet 0  . ramipril (ALTACE) 10 MG tablet Take 10 mg by mouth 2 (two) times daily.      No current facility-administered medications for this visit.    Allergies:   Codeine, Glimepiride, Jardiance [empagliflozin], Metformin and related, and Penicillins    Social History:  The patient  reports that she has never smoked. She has never used smokeless tobacco. She reports that she does not drink alcohol and does not use drugs.   Family History:  The patient's family history includes Arthritis in her daughter; CAD in her father and mother; Heart Problems in her brother; Heart attack in her mother.    ROS:  Please see the history of present illness.   Otherwise, review of systems are positive for easy bruising.   All other systems are reviewed and negative.    PHYSICAL EXAM: VS:  BP 128/74   Pulse 76   Ht 5\' 2"  (1.575 m)   Wt 169 lb 9.6 oz (76.9 kg)   SpO2 94%   BMI 31.02 kg/m  , BMI Body mass index is 31.02 kg/m. GEN: Well nourished, well developed, in no acute distress  HEENT: normal  Neck: no JVD, carotid bruits, or masses Cardiac: RRR; 3/6  systolic murmurs,no rubs, or gallops,no edema  Respiratory:  clear to auscultation bilaterally, normal work of breathing GI: soft, nontender, nondistended, + BS MS: no deformity or atrophy  Skin: warm and dry, bruises on hands Neuro:  Strength and sensation are intact Psych: euthymic mood, full affect   EKG:   The ekg ordered today demonstrates    Recent Labs: 01/13/2020: TSH 1.118 01/16/2020: B Natriuretic Peptide 1,208.3 01/17/2020: ALT 20 01/18/2020: Magnesium 1.8 01/20/2020:  BUN 12; Creatinine, Ser 0.98; Hemoglobin 9.0; Platelets 584; Potassium 3.9; Sodium 137   Lipid Panel No results found for: CHOL, TRIG, HDL, CHOLHDL, VLDL, LDLCALC, LDLDIRECT   Other studies Reviewed: Additional studies/ records that were reviewed today with results demonstrating: hospital records reviewed.   ASSESSMENT AND PLAN:  1. CAD: Prior stent placement. She is not reporting angina at this time, but does have DOE which could be partially cardiac in nature.  2. PAF: Eliquis for stroke prevention.  Received iron infusions.  Will have to hold Eliquis.  She did not feel palpitations.  Hold Eliquis before cath. 3. Aortic stenosis: Severe by echo.  Will need diagnostic left and right heart cath to see about how to replace Aortic valve.  Risks and benefits discussed.  All questions answered.  4. HTN: The current medical regimen is effective;  continue present plan and medications. 5. Hyperlipidemia: The current medical regimen is effective;  continue present plan and medications.    Current medicines are reviewed at length with the patient today.  The patient concerns regarding her medicines were addressed.  The following changes have been made:  No change  Labs/ tests ordered today include:  No orders of the defined types were placed in this encounter.   Recommend 150 minutes/week of aerobic exercise Ward fat, Ward carb, high fiber diet recommended  Disposition:   FU in based on cath, may need to see  TAVR team   Signed, Larae Grooms, MD  01/24/2020 10:09 AM    [facility] [address], [place], [place]  [address] Phone: [phone]; Fax: [phone]

## 2020-01-23 NOTE — Telephone Encounter (Signed)
Our office received another clearance request today for injection. I called the requesting office to confirm if this a new request as it looks like that we sent cardiac clearance on 12/30/19. I left message to please call back and ask for the pre op team to let us know if this a duplicate. I will place another request in just in-case it is a new request. I was given a different phone number to reach Dr. Shanda Bumps office, though had to leave message for a call back to clarify if duplicate or new request. New phone # I was given is [phone] and hit option 1 and ask for [place].      [facility] Pre-operative Risk Assessment    HEARTCARE STAFF: - Please ensure there is not already an duplicate clearance open for this procedure. - Under Visit Info/Reason for Call, type in Other and utilize the format Clearance MM/DD/YY or Clearance TBD. Do not use dashes or single digits. - If request is for dental extraction, please clarify the # of teeth to be extracted.  Request for surgical clearance: SEE NOTE AS NOT SURE IF THIS A DUPLICATE OR NEW REQUEST  1. What type of surgery is being performed? INTERVENTIONAL INJECTION FOR THERAPEUTIC PAIN MANAGEMENT   2. When is this surgery scheduled? TBD   3. What type of clearance is required (medical clearance vs. Pharmacy clearance to hold med vs. Both)? PHARM  4. Are there any medications that need to be held prior to surgery and how long? ELIQUIS x 3 DAYS   5. Practice name and name of physician performing surgery? [facility]; DR. Annie Main FRIEDMAN  6. What is the office phone number? [phone]   7.   What is the office fax number? [phone]  8.   Anesthesia type (None, local, MAC, general) ? NOT LISTED   [name] 01/23/2020, 11:09 AM  _________________________________________________________________   (provider comments below)

## 2020-01-23 NOTE — Telephone Encounter (Signed)
I attempted to call Dr. Shanda Bumps office prior to 5pm, but was directed to the emergency/after hours line.   I will re-fax my letter from today.

## 2020-01-23 NOTE — Telephone Encounter (Signed)
Dr. Shanda Bumps office returning call to preop team. Please advise.

## 2020-01-23 NOTE — Telephone Encounter (Signed)
Called and was connected to Dr Tommi Rumps office, left detailed message with pt name and date of birth and advised office to give [place] a call back

## 2020-01-23 NOTE — Telephone Encounter (Signed)
   [place], MD  Chart reviewed as part of pre-operative protocol coverage. Because of Sophia Ward's past medical history and time since last visit, they will require a follow-up visit in order to better assess preoperative cardiovascular risk.  This patient was just discharged from the hospital on 01/20/20 and has not been scheduled for a follow up appt. She will need to discuss next steps with Dr. Irish Lack concerning her AS. The clearance request from Dr. Shanda Bumps office if [name] is for injections in Oct. Currently, the patient is not sure she will have the injections in Oct. I think the safest thing for now is to have her follow up with Dr. Irish Lack to discuss next steps and preop clearance can be addressed closer to her injection date.     Pre-op covering staff: - Please schedule appointment and call patient to inform them. If patient already had an upcoming appointment within acceptable timeframe, please add "pre-op clearance" to the appointment notes so provider is aware. - Please contact requesting surgeon's office via preferred method (i.e, phone, fax) to inform them of need for appointment prior to surgery.  If applicable, this message will also be routed to pharmacy pool and/or primary cardiologist for input on holding anticoagulant/antiplatelet agent as requested below so that this information is available to the clearing provider at time of patient's appointment.   Sophia Lin Malacki Mcphearson, PA  01/23/2020, 2:42 PM

## 2020-01-23 NOTE — H&P (View-Only) (Signed)
Cardiology Office Note   Date:  01/24/2020   ID:  Sophia Ward, DOB [birth year], MRN [id number]  PCP:  [name], MD    No chief complaint on file.  CAD/AS  Wt Readings from Last 3 Encounters:  01/24/20 169 lb 9.6 oz (76.9 kg)  01/20/20 165 lb (74.8 kg)  01/06/20 171 lb 11.2 oz (77.9 kg)       History of Present Illness: Sophia Ward is a 86 y.o. female  Withh/o CAD. She had an LAD stent placed in 2004 (drug-eluting stent for restenosis) and has done well since then.  Her PLavix was stopped due to anemiain the past. Plavix was restartedlaterafter anemia was stable.  She haschronic back problems and has received injections in the past.  She has been intolerant of metformin in the pastdue to diarrhea.  In [name] 2018, she was in the hospital in [place] and was diagnosed with PMR. She was later found to have rheumatoid arthritis. MTX was started. Prednisone is being tapered gradually.  She has chronic back pain treated with injections. She travels to [place] in the winter time.   In May 2019, she had a PMR flare. She is on chronic Prednisone.   Hospital records from 5/21 showed: "Symptomatic microcytic anemia Significant drop in hemoglobin to around 7 noted from her usual baseline of 12-13. Patient was transfused 1 unit of blood due to her symptoms. She did notice dark to black-colored stool recently. Denies any fresh red blood per rectum. She is on Plavix for her heart disease. Last colonoscopy was in 2014. No concerning findings were noted per patient. Anemia panel shows iron deficiency. CT abdomen showed diverticulosis. No other concerning findings noted. Patient seen by gastroenterology. Initially plan was for upper endoscopy on 5/27 however due to electrolyte abnormalities and new onset of atrial fibrillation this was postponed to the following day. She underwent upper endoscopy. No bleeding lesions were noted. White  speckled mucosa was noted in the esophagus. Subsequently she underwent colonoscopy. Few ulcers were noted in the terminal ileum. No active bleeding was noted. Cleared by gastroenterology to start anticoagulation. Follow-up in their office in 2 to 4 weeks. PPI.  Paroxysmal atrial fibrillation This is a new diagnosis for her. She has had palpitations on and off for the past 2 months. TSH was 2.15. Echocardiogram shows normal systolic function. Trivial mitral regurgitation noted. However more significantly has severe aortic stenosis noted.  Patient was started back on her beta-blocker. She converted to sinus rhythm. Electrolytes have been corrected. Her chads 2 vascular score is 6. She is high risk for thromboembolism. Anticoagulation was discussed with her. She is agreeable to start Eliquis if GI has no objection.Patient was started on Eliquis. All of this was also communicated to her primary cardiologist, Dr. Irish Lack.  Severe aortic stenosis She had moderate aortic stenosis when echocardiogram was done in 2019. Seems to have progressedbased on echocardiogram done during this hospital stay.. Now she also has been found to have atrial fibrillation as discussed above. She will need to see her primary cardiologist, Dr. Irish Lack, in the outpatient setting. Discussed with him. He agrees with plans for anticoagulation. Patient will need repeat echocardiogram in the next few months."  Hospitalized with pneumonia in 12/2018.  Plan was for left heart catheterization once she recovered from pneumonia.  Hgb stayed in the 9 range.  She is feeling better since being in the hospital.  [facility] had some AFib, but converted to NSR.  SHe also had IV  iron while in the hospital.   She felt that the Lasix helped her as well.  She does not feel volume overloaded recently.  Weight has been stable.     Past Medical History:  Diagnosis Date  . Allergic rhinitis   . Anemia 09/2019  . Arthritis     hands  . BPPV (benign paroxysmal positional vertigo)   . Coronary artery disease   . Coronary atherosclerosis of native coronary artery 01/30/2014  . Diabetes mellitus without complication ([place])   . Essential hypertension, benign 01/30/2014  . GERD (gastroesophageal reflux disease)   . Hypertension   . Mixed hyperlipidemia 01/30/2014  . Myocardial infarction ([place]) 2004   mild MI-no damage- had stents  . Osteopenia   . Post-menopausal   . Rheumatoid arthritis ([place])   . Spinal stenosis   . Stenosing tenosynovitis     Past Surgical History:  Procedure Laterality Date  . ABDOMINAL HYSTERECTOMY    . APPENDECTOMY    . BIOPSY  10/25/2019   Procedure: BIOPSY;  Surgeon: Otis Brace, MD;  Location: [place];  Service: Gastroenterology;;  . BIOPSY  10/26/2019   Procedure: BIOPSY;  Surgeon: Otis Brace, MD;  Location: [facility];  Service: Gastroenterology;;  . CARDIAC CATHETERIZATION  2004  . carpel tunnel    . COLONOSCOPY WITH PROPOFOL N/A 10/30/2012   Procedure: COLONOSCOPY WITH PROPOFOL;  Surgeon: Garlan Fair, MD;  Location: [facility];  Service: Endoscopy;  Laterality: N/A;  . COLONOSCOPY WITH PROPOFOL N/A 10/26/2019   Procedure: COLONOSCOPY WITH PROPOFOL;  Surgeon: Otis Brace, MD;  Location: [facility];  Service: Gastroenterology;  Laterality: N/A;  . CORONARY ANGIOPLASTY  2004  . DILATION AND CURETTAGE OF UTERUS    . ESOPHAGOGASTRODUODENOSCOPY N/A 10/25/2019   Procedure: ESOPHAGOGASTRODUODENOSCOPY (EGD);  Surgeon: Otis Brace, MD;  Location: [facility]COPY;  Service: Gastroenterology;  Laterality: N/A;  . ESOPHAGOGASTRODUODENOSCOPY (EGD) WITH PROPOFOL N/A 10/30/2012   Procedure: ESOPHAGOGASTRODUODENOSCOPY (EGD) WITH PROPOFOL;  Surgeon: Garlan Fair, MD;  [facility];  Service: Endoscopy;  Laterality: N/A;  . EYE SURGERY     bilateral cataract with lens implant  . EYE SURGERY    . INJECTION OF SILICONE OIL Left 8/36/6294   Procedure:  Injection Of Silicone Oil;  Surgeon: Jalene Mullet, MD;  Location: [place];  Service: Ophthalmology;  Laterality: Left;  . left shouler surgery    . PHOTOCOAGULATION WITH LASER Left 02/12/2019   Procedure: Photocoagulation With Laser;  Surgeon: Jalene Mullet, MD;  Location: [facility];  Service: Ophthalmology;  Laterality: Left;  . 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 02/12/2019   Procedure: REPAIR OF COMPLEX TRACTION RETINAL DETACHMENT;  Surgeon: Jalene Mullet, MD;  Location: [facility];  Service: Ophthalmology;  Laterality: Left;  . TONSILLECTOMY    . VITRECTOMY 25 GAUGE WITH SCLERAL BUCKLE Left 02/12/2019   Procedure: Vitrectomy 25 Gauge With Scleral Buckle;  Surgeon: Jalene Mullet, MD;  Location: [facility];  Service: Ophthalmology;  Laterality: Left;     Current Outpatient Medications  Medication Sig Dispense Refill  . acetaminophen (TYLENOL) 650 MG CR tablet Take 1,300 mg by mouth at bedtime.    Marland Kitchen amLODipine (NORVASC) 10 MG tablet Take 1 tablet (10 mg total) by mouth every morning. 90 tablet 3  . apixaban (ELIQUIS) 5 MG TABS tablet Take 1 tablet (5 mg total) by mouth 2 (two) times daily. 180 tablet 1  . atorvastatin (LIPITOR) 40 MG tablet Take 1 tablet (40 mg total) by mouth at bedtime. 90 tablet 3  .  B-D ULTRAFINE III SHORT PEN 31G X 8 MM MISC 1 each by Other route in the morning, at noon, in the evening, and at bedtime.     . Calcium Carbonate-Vitamin D (CALCIUM + D PO) Take 1 tablet by mouth every morning.    . Cholecalciferol (VITAMIN D3) 125 MCG (5000 UT) CAPS Take 5,000 Units by mouth daily.     Marland Kitchen escitalopram (LEXAPRO) 5 MG tablet Take 5 mg by mouth daily.    Marland Kitchen estradiol (ESTRACE) 0.1 MG/GM vaginal cream Place 1 Applicatorful vaginally daily as needed (driness).     . folic acid (FOLVITE) 1 MG tablet Take 2 tablets (2 mg total) by mouth daily. 180 tablet 2  . insulin lispro (HUMALOG KWIKPEN) 100 UNIT/ML KiwkPen Inject 6 Units into the skin 3 (three) times daily.     Marland Kitchen LEVEMIR  FLEXTOUCH 100 UNIT/ML Pen Inject 14 Units into the skin every morning.   0  . methotrexate 2.5 MG tablet TAKE 5 TABLETS BY MOUTH ONCE A WEEK. Caution:Chemotherapy. Protect from light. (Patient taking differently: Take 12.5 mg by mouth every Wednesday. ) 60 tablet 0  . metoprolol succinate (TOPROL-XL) 100 MG 24 hr tablet Take 1 tablet (100 mg total) by mouth every morning. 90 tablet 3  . nitroGLYCERIN (NITROSTAT) 0.4 MG SL tablet DISSOLVE ONE TABLET UNDER THE TONGUE EVERY 5 MINUTES AS NEEDED FOR CHEST PAIN.  DO NOT EXCEED A TOTAL OF 3 DOSES IN 15 MINUTES 25 tablet 5  . Omega-3 Fatty Acids (FISH OIL PO) Take 1 capsule by mouth every morning.    . pantoprazole (PROTONIX) 40 MG tablet Take 1 tablet (40 mg total) by mouth daily after lunch. 30 tablet 1  . predniSONE (DELTASONE) 1 MG tablet Take 4 tablets (4 mg total) by mouth daily with breakfast. 360 tablet 0  . ramipril (ALTACE) 10 MG tablet Take 10 mg by mouth 2 (two) times daily.      No current facility-administered medications for this visit.    Allergies:   Codeine, Glimepiride, Jardiance [empagliflozin], Metformin and related, and Penicillins    Social History:  The patient  reports that she has never smoked. She has never used smokeless tobacco. She reports that she does not drink alcohol and does not use drugs.   Family History:  The patient's family history includes Arthritis in her daughter; CAD in her father and mother; Heart Problems in her brother; Heart attack in her mother.    ROS:  Please see the history of present illness.   Otherwise, review of systems are positive for easy bruising.   All other systems are reviewed and negative.    PHYSICAL EXAM: VS:  BP 128/74   Pulse 76   Ht 5\' 2"  (1.575 m)   Wt 169 lb 9.6 oz (76.9 kg)   SpO2 94%   BMI 31.02 kg/m  , BMI Body mass index is 31.02 kg/m. GEN: Well nourished, well developed, in no acute distress  HEENT: normal  Neck: no JVD, carotid bruits, or masses Cardiac: RRR; 3/6  systolic murmurs,no rubs, or gallops,no edema  Respiratory:  clear to auscultation bilaterally, normal work of breathing GI: soft, nontender, nondistended, + BS MS: no deformity or atrophy  Skin: warm and dry, bruises on hands Neuro:  Strength and sensation are intact Psych: euthymic mood, full affect   EKG:   The ekg ordered today demonstrates    Recent Labs: 01/13/2020: TSH 1.118 01/16/2020: B Natriuretic Peptide 1,208.3 01/17/2020: ALT 20 01/18/2020: Magnesium 1.8 01/20/2020:  BUN 12; Creatinine, Ser 0.98; Hemoglobin 9.0; Platelets 584; Potassium 3.9; Sodium 137   Lipid Panel No results found for: CHOL, TRIG, HDL, CHOLHDL, VLDL, LDLCALC, LDLDIRECT   Other studies Reviewed: Additional studies/ records that were reviewed today with results demonstrating: hospital records reviewed.   ASSESSMENT AND PLAN:  1. CAD: Prior stent placement. She is not reporting angina at this time, but does have DOE which could be partially cardiac in nature.  2. PAF: Eliquis for stroke prevention.  Received iron infusions.  Will have to hold Eliquis.  She did not feel palpitations.  Hold Eliquis before cath. 3. Aortic stenosis: Severe by echo.  Will need diagnostic left and right heart cath to see about how to replace Aortic valve.  Risks and benefits discussed.  All questions answered.  4. HTN: The current medical regimen is effective;  continue present plan and medications. 5. Hyperlipidemia: The current medical regimen is effective;  continue present plan and medications.    Current medicines are reviewed at length with the patient today.  The patient concerns regarding her medicines were addressed.  The following changes have been made:  No change  Labs/ tests ordered today include:  No orders of the defined types were placed in this encounter.   Recommend 150 minutes/week of aerobic exercise Low fat, low carb, high fiber diet recommended  Disposition:   FU in based on cath, may need to see  TAVR team   Signed, Larae Grooms, MD  01/24/2020 10:09 AM    [address], [place], [place]  [address] Phone: [phone]; Fax: [phone]

## 2020-01-23 NOTE — Progress Notes (Signed)
Cardiology Office Note   Date:  01/24/2020   ID:  Sophia Ward, DOB [birth year], MRN [id number]  PCP:  [name], MD    No chief complaint on file.  CAD/AS  Wt Readings from Last 3 Encounters:  01/24/20 169 lb 9.6 oz (76.9 kg)  01/20/20 165 lb (74.8 kg)  01/06/20 171 lb 11.2 oz (77.9 kg)       History of Present Illness: Sophia Ward is a 86 y.o. female  Withh/o CAD. She had an LAD stent placed in 2004 (drug-eluting stent for restenosis) and has done well since then.  Her PLavix was stopped due to anemiain the past. Plavix was restartedlaterafter anemia was stable.  She haschronic back problems and has received injections in the past.  She has been intolerant of metformin in the pastdue to diarrhea.  In [name] 2018, she was in the hospital in [place] and was diagnosed with PMR. She was later found to have rheumatoid arthritis. MTX was started. Prednisone is being tapered gradually.  She has chronic back pain treated with injections. She travels to [place] in the winter time.   In May 2019, she had a PMR flare. She is on chronic Prednisone.   Hospital records from 5/21 showed: "Symptomatic microcytic anemia Significant drop in hemoglobin to around 7 noted from her usual baseline of 12-13. Patient was transfused 1 unit of blood due to her symptoms. She did notice dark to black-colored stool recently. Denies any fresh red blood per rectum. She is on Plavix for her heart disease. Last colonoscopy was in 2014. No concerning findings were noted per patient. Anemia panel shows iron deficiency. CT abdomen showed diverticulosis. No other concerning findings noted. Patient seen by gastroenterology. Initially plan was for upper endoscopy on 5/27 however due to electrolyte abnormalities and new onset of atrial fibrillation this was postponed to the following day. She underwent upper endoscopy. No bleeding lesions were noted. White  speckled mucosa was noted in the esophagus. Subsequently she underwent colonoscopy. Few ulcers were noted in the terminal ileum. No active bleeding was noted. Cleared by gastroenterology to start anticoagulation. Follow-up in their office in 2 to 4 weeks. PPI.  Paroxysmal atrial fibrillation This is a new diagnosis for her. She has had palpitations on and off for the past 2 months. TSH was 2.15. Echocardiogram shows normal systolic function. Trivial mitral regurgitation noted. However more significantly has severe aortic stenosis noted.  Patient was started back on her beta-blocker. She converted to sinus rhythm. Electrolytes have been corrected. Her chads 2 vascular score is 6. She is high risk for thromboembolism. Anticoagulation was discussed with her. She is agreeable to start Eliquis if GI has no objection.Patient was started on Eliquis. All of this was also communicated to her primary cardiologist, Dr. Irish Lack.  Severe aortic stenosis She had moderate aortic stenosis when echocardiogram was done in 2019. Seems to have progressedbased on echocardiogram done during this hospital stay.. Now she also has been found to have atrial fibrillation as discussed above. She will need to see her primary cardiologist, Dr. Irish Lack, in the outpatient setting. Discussed with him. He agrees with plans for anticoagulation. Patient will need repeat echocardiogram in the next few months."  Hospitalized with pneumonia in 12/2018.  Plan was for left heart catheterization once she recovered from pneumonia.  Hgb stayed in the 9 range.  She is feeling better since being in the hospital.  [facility] had some AFib, but converted to NSR.  SHe also had IV  iron while in the hospital.   She felt that the Lasix helped her as well.  She does not feel volume overloaded recently.  Weight has been stable.     Past Medical History:  Diagnosis Date  . Allergic rhinitis   . Anemia 09/2019  . Arthritis     hands  . BPPV (benign paroxysmal positional vertigo)   . Coronary artery disease   . Coronary atherosclerosis of native coronary artery 01/30/2014  . Diabetes mellitus without complication ([place])   . Essential hypertension, benign 01/30/2014  . GERD (gastroesophageal reflux disease)   . Hypertension   . Mixed hyperlipidemia 01/30/2014  . Myocardial infarction ([place]) 2004   mild MI-no damage- had stents  . Osteopenia   . Post-menopausal   . Rheumatoid arthritis ([place])   . Spinal stenosis   . Stenosing tenosynovitis     Past Surgical History:  Procedure Laterality Date  . ABDOMINAL HYSTERECTOMY    . APPENDECTOMY    . BIOPSY  10/25/2019   Procedure: BIOPSY;  Surgeon: Otis Brace, MD;  Location: [place];  Service: Gastroenterology;;  . BIOPSY  10/26/2019   Procedure: BIOPSY;  Surgeon: Otis Brace, MD;  Location: [facility];  Service: Gastroenterology;;  . CARDIAC CATHETERIZATION  2004  . carpel tunnel    . COLONOSCOPY WITH PROPOFOL N/A 10/30/2012   Procedure: COLONOSCOPY WITH PROPOFOL;  Surgeon: Garlan Fair, MD;  [facility];  Service: Endoscopy;  Laterality: N/A;  . COLONOSCOPY WITH PROPOFOL N/A 10/26/2019   Procedure: COLONOSCOPY WITH PROPOFOL;  Surgeon: Otis Brace, MD;  Location: [facility];  Service: Gastroenterology;  Laterality: N/A;  . CORONARY ANGIOPLASTY  2004  . DILATION AND CURETTAGE OF UTERUS    . ESOPHAGOGASTRODUODENOSCOPY N/A 10/25/2019   Procedure: ESOPHAGOGASTRODUODENOSCOPY (EGD);  Surgeon: Otis Brace, MD;  Location: [facility];  Service: Gastroenterology;  Laterality: N/A;  . ESOPHAGOGASTRODUODENOSCOPY (EGD) WITH PROPOFOL N/A 10/30/2012   Procedure: ESOPHAGOGASTRODUODENOSCOPY (EGD) WITH PROPOFOL;  Surgeon: Garlan Fair, MD;  [facility];  Service: Endoscopy;  Laterality: N/A;  . EYE SURGERY     bilateral cataract with lens implant  . EYE SURGERY    . INJECTION OF SILICONE OIL Left 08/20/5571   Procedure:  Injection Of Silicone Oil;  Surgeon: Jalene Mullet, MD;  Location: [place];  Service: Ophthalmology;  Laterality: Left;  . left shouler surgery    . PHOTOCOAGULATION WITH LASER Left 02/12/2019   Procedure: Photocoagulation With Laser;  Surgeon: Jalene Mullet, MD;  Location: [place];  Service: Ophthalmology;  Laterality: Left;  . 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 02/12/2019   Procedure: REPAIR OF COMPLEX TRACTION RETINAL DETACHMENT;  Surgeon: Jalene Mullet, MD;  Location: [place];  Service: Ophthalmology;  Laterality: Left;  . TONSILLECTOMY    . VITRECTOMY 25 GAUGE WITH SCLERAL BUCKLE Left 02/12/2019   Procedure: Vitrectomy 25 Gauge With Scleral Buckle;  Surgeon: Jalene Mullet, MD;  Location: [place];  Service: Ophthalmology;  Laterality: Left;     Current Outpatient Medications  Medication Sig Dispense Refill  . acetaminophen (TYLENOL) 650 MG CR tablet Take 1,300 mg by mouth at bedtime.    Marland Kitchen amLODipine (NORVASC) 10 MG tablet Take 1 tablet (10 mg total) by mouth every morning. 90 tablet 3  . apixaban (ELIQUIS) 5 MG TABS tablet Take 1 tablet (5 mg total) by mouth 2 (two) times daily. 180 tablet 1  . atorvastatin (LIPITOR) 40 MG tablet Take 1 tablet (40 mg total) by mouth at bedtime. 90 tablet 3  .  B-D ULTRAFINE III SHORT PEN 31G X 8 MM MISC 1 each by Other route in the morning, at noon, in the evening, and at bedtime.     . Calcium Carbonate-Vitamin D (CALCIUM + D PO) Take 1 tablet by mouth every morning.    . Cholecalciferol (VITAMIN D3) 125 MCG (5000 UT) CAPS Take 5,000 Units by mouth daily.     Marland Kitchen escitalopram (LEXAPRO) 5 MG tablet Take 5 mg by mouth daily.    Marland Kitchen estradiol (ESTRACE) 0.1 MG/GM vaginal cream Place 1 Applicatorful vaginally daily as needed (driness).     . folic acid (FOLVITE) 1 MG tablet Take 2 tablets (2 mg total) by mouth daily. 180 tablet 2  . insulin lispro (HUMALOG KWIKPEN) 100 UNIT/ML KiwkPen Inject 6 Units into the skin 3 (three) times daily.     Marland Kitchen LEVEMIR  FLEXTOUCH 100 UNIT/ML Pen Inject 14 Units into the skin every morning.   0  . methotrexate 2.5 MG tablet TAKE 5 TABLETS BY MOUTH ONCE A WEEK. Caution:Chemotherapy. Protect from light. (Patient taking differently: Take 12.5 mg by mouth every Wednesday. ) 60 tablet 0  . metoprolol succinate (TOPROL-XL) 100 MG 24 hr tablet Take 1 tablet (100 mg total) by mouth every morning. 90 tablet 3  . nitroGLYCERIN (NITROSTAT) 0.4 MG SL tablet DISSOLVE ONE TABLET UNDER THE TONGUE EVERY 5 MINUTES AS NEEDED FOR CHEST PAIN.  DO NOT EXCEED A TOTAL OF 3 DOSES IN 15 MINUTES 25 tablet 5  . Omega-3 Fatty Acids (FISH OIL PO) Take 1 capsule by mouth every morning.    . pantoprazole (PROTONIX) 40 MG tablet Take 1 tablet (40 mg total) by mouth daily after lunch. 30 tablet 1  . predniSONE (DELTASONE) 1 MG tablet Take 4 tablets (4 mg total) by mouth daily with breakfast. 360 tablet 0  . ramipril (ALTACE) 10 MG tablet Take 10 mg by mouth 2 (two) times daily.      No current facility-administered medications for this visit.    Allergies:   Codeine, Glimepiride, Jardiance [empagliflozin], Metformin and related, and Penicillins    Social History:  The patient  reports that she has never smoked. She has never used smokeless tobacco. She reports that she does not drink alcohol and does not use drugs.   Family History:  The patient's family history includes Arthritis in her daughter; CAD in her father and mother; Heart Problems in her brother; Heart attack in her mother.    ROS:  Please see the history of present illness.   Otherwise, review of systems are positive for easy bruising.   All other systems are reviewed and negative.    PHYSICAL EXAM: VS:  BP 128/74   Pulse 76   Ht 5\' 2"  (1.575 m)   Wt 169 lb 9.6 oz (76.9 kg)   SpO2 94%   BMI 31.02 kg/m  , BMI Body mass index is 31.02 kg/m. GEN: Well nourished, well developed, in no acute distress  HEENT: normal  Neck: no JVD, carotid bruits, or masses Cardiac: RRR; 3/6  systolic murmurs,no rubs, or gallops,no edema  Respiratory:  clear to auscultation bilaterally, normal work of breathing GI: soft, nontender, nondistended, + BS MS: no deformity or atrophy  Skin: warm and dry, bruises on hands Neuro:  Strength and sensation are intact Psych: euthymic mood, full affect   EKG:   The ekg ordered today demonstrates    Recent Labs: 01/13/2020: TSH 1.118 01/16/2020: B Natriuretic Peptide 1,208.3 01/17/2020: ALT 20 01/18/2020: Magnesium 1.8 01/20/2020:  BUN 12; Creatinine, Ser 0.98; Hemoglobin 9.0; Platelets 584; Potassium 3.9; Sodium 137   Lipid Panel No results found for: CHOL, TRIG, HDL, CHOLHDL, VLDL, LDLCALC, LDLDIRECT   Other studies Reviewed: Additional studies/ records that were reviewed today with results demonstrating: hospital records reviewed.   ASSESSMENT AND PLAN:  1. CAD: Prior stent placement. She is not reporting angina at this time, but does have DOE which could be partially cardiac in nature.  2. PAF: Eliquis for stroke prevention.  Received iron infusions.  Will have to hold Eliquis.  She did not feel palpitations.  Hold Eliquis before cath. 3. Aortic stenosis: Severe by echo.  Will need diagnostic left and right heart cath to see about how to replace Aortic valve.  Risks and benefits discussed.  All questions answered.  4. HTN: The current medical regimen is effective;  continue present plan and medications. 5. Hyperlipidemia: The current medical regimen is effective;  continue present plan and medications.    Current medicines are reviewed at length with the patient today.  The patient concerns regarding her medicines were addressed.  The following changes have been made:  No change  Labs/ tests ordered today include:  No orders of the defined types were placed in this encounter.   Recommend 150 minutes/week of aerobic exercise Low fat, low carb, high fiber diet recommended  Disposition:   FU in based on cath, may need to see  TAVR team   Signed, Larae Grooms, MD  01/24/2020 10:09 AM    [address], [place], [place]  [address] Phone: [phone]; Fax: [phone]

## 2020-01-23 NOTE — Telephone Encounter (Signed)
Send to scheduler to schedule pre op appt

## 2020-01-24 ENCOUNTER — Telehealth: Payer: Self-pay | Admitting: General Practice

## 2020-01-24 ENCOUNTER — Ambulatory Visit (INDEPENDENT_AMBULATORY_CARE_PROVIDER_SITE_OTHER): Payer: Medicare Other | Admitting: Interventional Cardiology

## 2020-01-24 ENCOUNTER — Other Ambulatory Visit: Payer: Self-pay

## 2020-01-24 ENCOUNTER — Telehealth: Payer: Self-pay | Admitting: Interventional Cardiology

## 2020-01-24 ENCOUNTER — Encounter: Payer: Self-pay | Admitting: Interventional Cardiology

## 2020-01-24 VITALS — BP 128/74 | HR 76 | Ht 62.0 in | Wt 169.6 lb

## 2020-01-24 DIAGNOSIS — E782 Mixed hyperlipidemia: Secondary | ICD-10-CM | POA: Diagnosis not present

## 2020-01-24 DIAGNOSIS — I350 Nonrheumatic aortic (valve) stenosis: Secondary | ICD-10-CM

## 2020-01-24 DIAGNOSIS — I10 Essential (primary) hypertension: Secondary | ICD-10-CM | POA: Diagnosis not present

## 2020-01-24 DIAGNOSIS — I25119 Atherosclerotic heart disease of native coronary artery with unspecified angina pectoris: Secondary | ICD-10-CM

## 2020-01-24 DIAGNOSIS — I480 Paroxysmal atrial fibrillation: Secondary | ICD-10-CM

## 2020-01-24 NOTE — Telephone Encounter (Signed)
Received a call from [facility] looking to speak with someone from the preop team. Please call [phone]

## 2020-01-24 NOTE — Telephone Encounter (Signed)
Return call to [facility].  Attempted to call x2, met with busy signal on each attempt.  Patient has appointment with Dr. Irish Lack today 01/24/2020 at 10 AM.

## 2020-01-24 NOTE — Patient Instructions (Signed)
Medication Instructions:  Your physician recommends that you continue on your current medications as directed. Please refer to the Current Medication list given to you today.  *If you need a refill on your cardiac medications before your next appointment, please call your pharmacy*   Lab Work: None  If you have labs (blood work) drawn today and your tests are completely normal, you will receive your results only by: Marland Kitchen MyChart Message (if you have MyChart) OR . A paper copy in the mail If you have any lab test that is abnormal or we need to change your treatment, we will call you to review the results.   Testing/Procedures: Your physician has requested that you have a cardiac catheterization. Cardiac catheterization is used to diagnose and/or treat various heart conditions. Doctors may recommend this procedure for a number of different reasons. The most common reason is to evaluate chest pain. Chest pain can be a symptom of coronary artery disease (CAD), and cardiac catheterization can show whether plaque is narrowing or blocking your heart's arteries. This procedure is also used to evaluate the valves, as well as measure the blood flow and oxygen levels in different parts of your heart. For further information please visit [email or web address]. Please follow instruction sheet, as given.  Follow-Up: At [facility], you and your health needs are our priority.  As part of our continuing mission to provide you with exceptional heart care, we have created designated Provider Care Teams.  These Care Teams include your primary Cardiologist (physician) and Advanced Practice Providers (APPs -  Physician Assistants and Nurse Practitioners) who all work together to provide you with the care you need, when you need it.  We recommend signing up for the patient portal called "MyChart".  Sign up information is provided on this After Visit Summary.  MyChart is used to connect with patients for Virtual Visits  (Telemedicine).  Patients are able to view lab/test results, encounter notes, upcoming appointments, etc.  Non-urgent messages can be sent to your provider as well.   To learn more about what you can do with MyChart, go to [email or web address].    Your next appointment:   12 month(s)  The format for your next appointment:   In Person  Provider:   You may see Larae Grooms, MD or one of the following Advanced Practice Providers on your designated Care Team:    Melina Copa, PA-C  Ermalinda Barrios, PA-C    Other Instructions    [facility] OFFICE [place], [place] [address] Dept: [phone] Loc: [facility]  01/24/2020  You are scheduled for a Cardiac Catheterization on Wednesday, September 1 with Dr. Larae Grooms.  1. Please arrive at the [facility] (Main Entrance A) at [facility]: [address] [place], [place] [address] at 12:30 PM (This time is two hours before your procedure to ensure your preparation). Free valet parking service is available.   Special note: Every effort is made to have your procedure done on time. Please understand that emergencies sometimes delay scheduled procedures.  2. Diet: Do not eat solid foods after midnight.  The patient may have clear liquids until 5am upon the day of the procedure.  3. Labs: We will repeat labs the morning of the procedure.  Due to recent COVID-19 restrictions implemented by our local and state authorities and in an effort to keep both patients and staff as safe as possible,  our hospital system requires COVID-19 testing prior to certain scheduled hospital procedures.  Please go to [place] [place], [place] [address] on 01/27/20 at 10:35 AM  .  This is a drive up testing site.  You will not need to exit your vehicle.  You will not be billed at the time of testing but may receive a bill  later depending on your insurance. You must agree to self-quarantine from the time of your testing until the procedure date on 01/29/20.  This should included staying home with ONLY the people you live with.  Avoid take-out, grocery store shopping or leaving the house for any non-emergent reason.  Failure to have your COVID-19 test done on the date and time you have been scheduled will result in cancellation of your procedure.  Please call our office at [phone] if you have any questions.   4. Medication instructions in preparation for your procedure:   Contrast Allergy: No  HOLD Eliquis for 2 days prior to procedure  DO NOT take insulin the day of the procedure  DO NOT take the levemir the morning of the procedure  DO NOT take ramipril the morning of the procedure  On the morning of your procedure, take a baby Aspirin 81 mg and any morning medicines NOT listed above.  You may use sips of water.  5. Plan for one night stay--bring personal belongings. 6. Bring a current list of your medications and current insurance cards. 7. You MUST have a responsible person to drive you home. 8. Someone MUST be with you the first 24 hours after you arrive home or your discharge will be delayed. 9. Please wear clothes that are easy to get on and off and wear slip-on shoes.  Thank you for allowing [place] to care for you!   -- [facility]

## 2020-01-27 ENCOUNTER — Other Ambulatory Visit (HOSPITAL_COMMUNITY)
Admission: RE | Admit: 2020-01-27 | Discharge: 2020-01-27 | Disposition: A | Discharge status: Home / Self Care | Payer: Medicare Other | Source: Ambulatory Visit | Attending: Interventional Cardiology | Admitting: Interventional Cardiology

## 2020-01-27 DIAGNOSIS — Z20822 Contact with and (suspected) exposure to covid-19: Secondary | ICD-10-CM | POA: Diagnosis not present

## 2020-01-27 DIAGNOSIS — Z01812 Encounter for preprocedural laboratory examination: Secondary | ICD-10-CM | POA: Diagnosis not present

## 2020-01-27 LAB — SARS CORONAVIRUS 2 (TAT 6-24 HRS): SARS Coronavirus 2: NEGATIVE

## 2020-01-27 NOTE — Telephone Encounter (Addendum)
   Primary Cardiologist: Larae Grooms, MD  Chart reviewed as part of pre-operative protocol coverage. Patient has history of cAD s/p LAD PCI 2004 DES for restensosis, chronic back problems, PMR, RA, prior symptomatic anemia, PAF, severe aortic stenosis.   As below, patient is requested to undergo interventional injections for therapeutic pain management. Previous APP recommended that patient see Dr. Irish Lack first in follow-up to help guide plan for this. He evaluated her on 01/24/20 and at this time she is also pending cardiac cath on 9/1 for dyspnea on exertion in order to evaluate her CAD and severe aortic stenosis. I will route this message to Dr. Irish Lack to get his input on timing of when she might be cleared for injection as requested below in the context of her ongoing workup by him. Dr. Irish Lack  - Please route response to P CV DIV PREOP (the pre-op pool). Thank you.  As an aside, it does appear in a separate phone note Coletta Memos NP tried to call requesting number back but was met with busy signal. I too get the same message. Will forward this message to the fax number provided to relay update that ongoing cardiac testing is occurring before patient can be cleared for procedure.  Charlie Pitter, PA-C 01/27/2020, 8:17 AM

## 2020-01-27 NOTE — Telephone Encounter (Signed)
Message sent to pre op

## 2020-01-27 NOTE — Telephone Encounter (Signed)
Sophia Ward 3 days ago  PP   Received a call from [facility] looking to speak with someone from the preop team. Please call [place] [phone]  Sophia Ward

## 2020-01-28 ENCOUNTER — Telehealth: Payer: Self-pay | Admitting: *Deleted

## 2020-01-28 NOTE — Telephone Encounter (Signed)
Pt contacted pre-catheterization scheduled at [facility] for: Wednesday January 29, 2020 2:30 PM Verified arrival time and place: [facility]) at: 12 Noon-needs BMP/CBC   No solid food after midnight prior to cath, clear liquids until 5 AM day of procedure.  Hold: Eliquis-none 01/27/20 until post procedure Insulin-AM of procedure. Ramipril-PM prior/AM of procedure-GFR 53   Except hold medications AM meds can be  taken pre-cath with sips of water including: ASA 81 mg   Confirmed patient has responsible adult to drive home post procedure and observe 24 hours after arriving home:   You are allowed ONE visitor in the waiting room during the time you are at the hospital for your procedure. Both you and your visitor must wear a mask once you enter the hospital.       COVID-19 Pre-Screening Questions:  . In the past 10 days have you had a new cough, shortness of breath, headache, congestion, fever (100 or greater) unexplained body aches, new sore throat, or sudden loss of taste or sense of smell? 01/20/20 discharge hospital for pneumonia-no new symptoms . In the past 10 days have you been around anyone with known Covid 19? no . Have you been vaccinated for COVID-19? Yes, see immunization history   Reviewed procedure/mask/visitor instructions, COVID-19 questions with patient.

## 2020-01-29 ENCOUNTER — Ambulatory Visit (HOSPITAL_COMMUNITY)
Admission: RE | Admit: 2020-01-29 | Discharge: 2020-01-29 | Disposition: A | Discharge status: Home / Self Care | Payer: Medicare Other | Attending: Interventional Cardiology | Admitting: Interventional Cardiology

## 2020-01-29 ENCOUNTER — Other Ambulatory Visit: Payer: Self-pay

## 2020-01-29 ENCOUNTER — Encounter (HOSPITAL_COMMUNITY)
Admission: RE | Disposition: A | Discharge status: Home / Self Care | Payer: Self-pay | Source: Home / Self Care | Attending: Interventional Cardiology

## 2020-01-29 ENCOUNTER — Encounter (HOSPITAL_COMMUNITY): Payer: Self-pay | Admitting: Interventional Cardiology

## 2020-01-29 DIAGNOSIS — I350 Nonrheumatic aortic (valve) stenosis: Secondary | ICD-10-CM | POA: Diagnosis not present

## 2020-01-29 DIAGNOSIS — I10 Essential (primary) hypertension: Secondary | ICD-10-CM | POA: Diagnosis not present

## 2020-01-29 DIAGNOSIS — G8929 Other chronic pain: Secondary | ICD-10-CM | POA: Insufficient documentation

## 2020-01-29 DIAGNOSIS — Z885 Allergy status to narcotic agent status: Secondary | ICD-10-CM | POA: Insufficient documentation

## 2020-01-29 DIAGNOSIS — E782 Mixed hyperlipidemia: Secondary | ICD-10-CM | POA: Diagnosis not present

## 2020-01-29 DIAGNOSIS — I252 Old myocardial infarction: Secondary | ICD-10-CM | POA: Insufficient documentation

## 2020-01-29 DIAGNOSIS — K219 Gastro-esophageal reflux disease without esophagitis: Secondary | ICD-10-CM | POA: Insufficient documentation

## 2020-01-29 DIAGNOSIS — Z955 Presence of coronary angioplasty implant and graft: Secondary | ICD-10-CM | POA: Insufficient documentation

## 2020-01-29 DIAGNOSIS — I25118 Atherosclerotic heart disease of native coronary artery with other forms of angina pectoris: Secondary | ICD-10-CM

## 2020-01-29 DIAGNOSIS — Z7952 Long term (current) use of systemic steroids: Secondary | ICD-10-CM | POA: Diagnosis not present

## 2020-01-29 DIAGNOSIS — E119 Type 2 diabetes mellitus without complications: Secondary | ICD-10-CM | POA: Diagnosis not present

## 2020-01-29 DIAGNOSIS — R0609 Other forms of dyspnea: Secondary | ICD-10-CM | POA: Diagnosis not present

## 2020-01-29 DIAGNOSIS — M069 Rheumatoid arthritis, unspecified: Secondary | ICD-10-CM | POA: Insufficient documentation

## 2020-01-29 DIAGNOSIS — Z880 Allergy status to penicillin: Secondary | ICD-10-CM | POA: Diagnosis not present

## 2020-01-29 DIAGNOSIS — Z79899 Other long term (current) drug therapy: Secondary | ICD-10-CM | POA: Diagnosis not present

## 2020-01-29 DIAGNOSIS — M549 Dorsalgia, unspecified: Secondary | ICD-10-CM | POA: Insufficient documentation

## 2020-01-29 DIAGNOSIS — I480 Paroxysmal atrial fibrillation: Secondary | ICD-10-CM | POA: Insufficient documentation

## 2020-01-29 DIAGNOSIS — I2510 Atherosclerotic heart disease of native coronary artery without angina pectoris: Secondary | ICD-10-CM | POA: Diagnosis not present

## 2020-01-29 DIAGNOSIS — Z794 Long term (current) use of insulin: Secondary | ICD-10-CM | POA: Insufficient documentation

## 2020-01-29 DIAGNOSIS — Z7901 Long term (current) use of anticoagulants: Secondary | ICD-10-CM | POA: Insufficient documentation

## 2020-01-29 HISTORY — PX: INTRAVASCULAR PRESSURE WIRE/FFR STUDY: CATH118243

## 2020-01-29 HISTORY — PX: CORONARY PRESSURE/FFR STUDY: CATH118243

## 2020-01-29 HISTORY — PX: RIGHT/LEFT HEART CATH AND CORONARY ANGIOGRAPHY: CATH118266

## 2020-01-29 LAB — GLUCOSE, CAPILLARY
Glucose-Capillary: 155 mg/dL — ABNORMAL HIGH (ref 70–99)
Glucose-Capillary: 139 mg/dL — ABNORMAL HIGH (ref 70–99)

## 2020-01-29 LAB — POCT ACTIVATED CLOTTING TIME: Activated Clotting Time: 219 seconds

## 2020-01-29 LAB — POCT I-STAT EG7
Acid-Base Excess: 0 mmol/L (ref 0.0–2.0)
Bicarbonate: 24.2 mmol/L (ref 20.0–28.0)
Calcium, Ion: 1.23 mmol/L (ref 1.15–1.40)
HCT: 30 % — ABNORMAL LOW (ref 36.0–46.0)
Hemoglobin: 10.2 g/dL — ABNORMAL LOW (ref 12.0–15.0)
O2 Saturation: 62 %
Potassium: 3.9 mmol/L (ref 3.5–5.1)
Sodium: 142 mmol/L (ref 135–145)
TCO2: 25 mmol/L (ref 22–32)
pCO2, Ven: 38.4 mmHg — ABNORMAL LOW (ref 44.0–60.0)
pH, Ven: 7.407 (ref 7.250–7.430)
pO2, Ven: 32 mmHg (ref 32.0–45.0)

## 2020-01-29 LAB — POCT I-STAT 7, (LYTES, BLD GAS, ICA,H+H)
Acid-base deficit: 2 mmol/L (ref 0.0–2.0)
Bicarbonate: 22.4 mmol/L (ref 20.0–28.0)
Calcium, Ion: 1.24 mmol/L (ref 1.15–1.40)
HCT: 30 % — ABNORMAL LOW (ref 36.0–46.0)
Hemoglobin: 10.2 g/dL — ABNORMAL LOW (ref 12.0–15.0)
O2 Saturation: 90 %
Potassium: 3.9 mmol/L (ref 3.5–5.1)
Sodium: 142 mmol/L (ref 135–145)
TCO2: 23 mmol/L (ref 22–32)
pCO2 arterial: 34.3 mmHg (ref 32.0–48.0)
pH, Arterial: 7.423 (ref 7.350–7.450)
pO2, Arterial: 56 mmHg — ABNORMAL LOW (ref 83.0–108.0)

## 2020-01-29 SURGERY — RIGHT/LEFT HEART CATH AND CORONARY ANGIOGRAPHY
Anesthesia: LOCAL

## 2020-01-29 MED ORDER — SODIUM CHLORIDE 0.9% FLUSH: 3.0000 mL | INTRAVENOUS | Status: DC | PRN

## 2020-01-29 MED ORDER — HEPARIN SODIUM (PORCINE) 1000 UNIT/ML IJ SOLN
INTRAMUSCULAR | Status: DC | PRN
  Administered 2020-01-29: 6000 [IU] via INTRAVENOUS
  Administered 2020-01-29: 4000 [IU] via INTRAVENOUS

## 2020-01-29 MED ORDER — MIDAZOLAM HCL 2 MG/2ML IJ SOLN
INTRAMUSCULAR | Status: DC | PRN
  Administered 2020-01-29: 1 mg via INTRAVENOUS

## 2020-01-29 MED ORDER — SODIUM CHLORIDE 0.9% FLUSH: 3.0000 mL | Freq: Two times a day (BID) | INTRAVENOUS | Status: DC

## 2020-01-29 MED ORDER — VERAPAMIL HCL 2.5 MG/ML IV SOLN
INTRAVENOUS | Status: DC | PRN
  Administered 2020-01-29: 10 mL via INTRA_ARTERIAL

## 2020-01-29 MED ORDER — HYDRALAZINE HCL 20 MG/ML IJ SOLN: 10.0000 mg | INTRAMUSCULAR | Status: DC | PRN

## 2020-01-29 MED ORDER — HEPARIN (PORCINE) IN NACL 1000-0.9 UT/500ML-% IV SOLN
INTRAVENOUS | Status: AC
  Filled 2020-01-29: qty 1000

## 2020-01-29 MED ORDER — FENTANYL CITRATE (PF) 100 MCG/2ML IJ SOLN
INTRAMUSCULAR | Status: AC
  Filled 2020-01-29: qty 2

## 2020-01-29 MED ORDER — SODIUM CHLORIDE 0.9 % WEIGHT BASED INFUSION: 1.0000 mL/kg/h | INTRAVENOUS | Status: DC

## 2020-01-29 MED ORDER — HEPARIN SODIUM (PORCINE) 1000 UNIT/ML IJ SOLN
INTRAMUSCULAR | Status: AC
  Filled 2020-01-29: qty 1

## 2020-01-29 MED ORDER — FENTANYL CITRATE (PF) 100 MCG/2ML IJ SOLN
INTRAMUSCULAR | Status: DC | PRN
Start: 2020-01-29 — End: 2020-01-29
  Administered 2020-01-29: 25 ug via INTRAVENOUS

## 2020-01-29 MED ORDER — LABETALOL HCL 5 MG/ML IV SOLN: 10.0000 mg | INTRAVENOUS | Status: DC | PRN

## 2020-01-29 MED ORDER — SODIUM CHLORIDE 0.9 % WEIGHT BASED INFUSION
3.0000 mL/kg/h | INTRAVENOUS | Status: AC
  Administered 2020-01-29: 3 mL/kg/h via INTRAVENOUS

## 2020-01-29 MED ORDER — ACETAMINOPHEN 325 MG PO TABS: 650.0000 mg | ORAL_TABLET | ORAL | Status: DC | PRN

## 2020-01-29 MED ORDER — LIDOCAINE HCL (PF) 1 % IJ SOLN
INTRAMUSCULAR | Status: DC | PRN
  Administered 2020-01-29 (×2): 2 mL

## 2020-01-29 MED ORDER — SODIUM CHLORIDE 0.9 % IV SOLN: 250.0000 mL | INTRAVENOUS | Status: DC | PRN

## 2020-01-29 MED ORDER — VERAPAMIL HCL 2.5 MG/ML IV SOLN
INTRAVENOUS | Status: AC
  Filled 2020-01-29: qty 2

## 2020-01-29 MED ORDER — ASPIRIN 81 MG PO CHEW: 81.0000 mg | CHEWABLE_TABLET | ORAL | Status: DC

## 2020-01-29 MED ORDER — MIDAZOLAM HCL 2 MG/2ML IJ SOLN
INTRAMUSCULAR | Status: AC
  Filled 2020-01-29: qty 2

## 2020-01-29 MED ORDER — LIDOCAINE HCL (PF) 1 % IJ SOLN
INTRAMUSCULAR | Status: AC
  Filled 2020-01-29: qty 30

## 2020-01-29 MED ORDER — ONDANSETRON HCL 4 MG/2ML IJ SOLN: 4.0000 mg | Freq: Four times a day (QID) | INTRAMUSCULAR | Status: DC | PRN

## 2020-01-29 MED ORDER — HEPARIN (PORCINE) IN NACL 1000-0.9 UT/500ML-% IV SOLN
INTRAVENOUS | Status: DC | PRN
  Administered 2020-01-29 (×2): 500 mL

## 2020-01-29 MED ORDER — IOHEXOL 350 MG/ML SOLN
INTRAVENOUS | Status: DC | PRN
  Administered 2020-01-29: 75 mL

## 2020-01-29 MED ORDER — SODIUM CHLORIDE 0.9 % IV SOLN: INTRAVENOUS | Status: AC

## 2020-01-29 MED ORDER — APIXABAN 5 MG PO TABS
5.0000 mg | ORAL_TABLET | Freq: Two times a day (BID) | ORAL | 1 refills | Status: DC
Start: 2020-01-31 — End: 2020-02-25

## 2020-01-29 SURGICAL SUPPLY — 20 items
CATH 5FR JL3.5 JR4 ANG PIG MP (CATHETERS) ×1 IMPLANT
CATH BALLN WEDGE 5F 110CM (CATHETERS) ×1 IMPLANT
CATH INFINITI 5 FR 3DRC (CATHETERS) ×1 IMPLANT
CATH INFINITI 5FR AL1 (CATHETERS) ×1 IMPLANT
CATH LAUNCHER 6FR EBU 3 (CATHETERS) ×1 IMPLANT
DEVICE RAD COMP TR BAND LRG (VASCULAR PRODUCTS) ×1 IMPLANT
DEVICE RAD TR BAND REGULAR (VASCULAR PRODUCTS) ×1 IMPLANT
GLIDESHEATH SLEND SS 6F .021 (SHEATH) ×1 IMPLANT
GUIDEWIRE INQWIRE 1.5J.035X260 (WIRE) IMPLANT
GUIDEWIRE PRESSURE COMET II (WIRE) ×1 IMPLANT
INQWIRE 1.5J .035X260CM (WIRE) ×2
KIT HEART LEFT (KITS) ×2 IMPLANT
KIT HEMO VALVE WATCHDOG (MISCELLANEOUS) ×1 IMPLANT
PACK CARDIAC CATHETERIZATION (CUSTOM PROCEDURE TRAY) ×2 IMPLANT
SHEATH GLIDE SLENDER 4/5FR (SHEATH) ×1 IMPLANT
SHEATH PROBE COVER 6X72 (BAG) ×1 IMPLANT
SYR MEDRAD MARK 7 150ML (SYRINGE) ×2 IMPLANT
TRANSDUCER W/STOPCOCK (MISCELLANEOUS) ×2 IMPLANT
TUBING CIL FLEX 10 FLL-RA (TUBING) ×2 IMPLANT
WIRE EMERALD ST .035X150CM (WIRE) ×1 IMPLANT

## 2020-01-29 NOTE — Discharge Instructions (Signed)
Radial Site Care  This sheet gives you information about how to care for yourself after your procedure. Your health care provider may also give you more specific instructions. If you have problems or questions, contact your health care provider. What can I expect after the procedure? After the procedure, it is common to have:  Bruising and tenderness at the catheter insertion area. Follow these instructions at home: Medicines  Take over-the-counter and prescription medicines only as told by your health care provider. Insertion site care  Follow instructions from your health care provider about how to take care of your insertion site. Make sure you: ? Wash your hands with soap and water before you change your bandage (dressing). If soap and water are not available, use hand sanitizer. ? Change your dressing as told by your health care provider. ? Leave stitches (sutures), skin glue, or adhesive strips in place. These skin closures may need to stay in place for 2 weeks or longer. If adhesive strip edges start to loosen and curl up, you may trim the loose edges. Do not remove adhesive strips completely unless your health care provider tells you to do that.  Check your insertion site every day for signs of infection. Check for: ? Redness, swelling, or pain. ? Fluid or blood. ? Pus or a bad smell. ? Warmth.  Do not take baths, swim, or use a hot tub until your health care provider approves.  You may shower 24-48 hours after the procedure, or as directed by your health care provider. ? Remove the dressing and gently wash the site with plain soap and water. ? Pat the area dry with a clean towel. ? Do not rub the site. That could cause bleeding.  Do not apply powder or lotion to the site. Activity   For 24 hours after the procedure, or as directed by your health care provider: ? Do not flex or bend the affected arm. ? Do not push or pull heavy objects with the affected arm. ? Do not  drive yourself home from the hospital or clinic. You may drive 24 hours after the procedure unless your health care provider tells you not to. ? Do not operate machinery or power tools.  Do not lift anything that is heavier than 10 lb (4.5 kg), or the limit that you are told, until your health care provider says that it is safe.  Ask your health care provider when it is okay to: ? Return to work or school. ? Resume usual physical activities or sports. ? Resume sexual activity. General instructions  If the catheter site starts to bleed, raise your arm and put firm pressure on the site. If the bleeding does not stop, get help right away. This is a medical emergency.  If you went home on the same day as your procedure, a responsible adult should be with you for the first 24 hours after you arrive home.  Keep all follow-up visits as told by your health care provider. This is important. Contact a health care provider if:  You have a fever.  You have redness, swelling, or yellow drainage around your insertion site. Get help right away if:  You have unusual pain at the radial site.  The catheter insertion area swells very fast.  The insertion area is bleeding, and the bleeding does not stop when you hold steady pressure on the area.  Your arm or hand becomes pale, cool, tingly, or numb. These symptoms may represent a serious problem   that is an emergency. Do not wait to see if the symptoms will go away. Get medical help right away. Call your local emergency services (911 in the [place] Do not drive yourself to the hospital. Summary  After the procedure, it is common to have bruising and tenderness at the site.  Follow instructions from your health care provider about how to take care of your radial site wound. Check the wound every day for signs of infection.  Do not lift anything that is heavier than 10 lb (4.5 kg), or the limit that you are told, until your health care provider says  that it is safe. This information is not intended to replace advice given to you by your health care provider. Make sure you discuss any questions you have with your health care provider. Document Revised: 06/21/2017 Document Reviewed: 06/21/2017 Elsevier Patient Education  2020 Elsevier Inc.  

## 2020-01-29 NOTE — Progress Notes (Signed)
Dr Irish Lack in to see client and right forearm bruised and soft

## 2020-01-29 NOTE — Interval H&P Note (Signed)
Cath Lab Visit (complete for each Cath Lab visit)  Clinical Evaluation Leading to the Procedure:   ACS: No.  Non-ACS:    Anginal Classification: CCS III  Anti-ischemic medical therapy: Maximal Therapy (2 or more classes of medications)  Non-Invasive Test Results: No non-invasive testing performed  Prior CABG: No previous CABG  Severe AS    History and Physical Interval Note:  01/29/2020 2:17 PM  Sophia Ward  has presented today for surgery, with the diagnosis of AORTIC STENOSIS.  The various methods of treatment have been discussed with the patient and family. After consideration of risks, benefits and other options for treatment, the patient has consented to  Procedure(s): RIGHT/LEFT HEART CATH AND CORONARY ANGIOGRAPHY (N/A) as a surgical intervention.  The patient's history has been reviewed, patient examined, no change in status, stable for surgery.  I have reviewed the patient's chart and labs.  Questions were answered to the patient's satisfaction.     Larae Grooms

## 2020-01-29 NOTE — Progress Notes (Signed)
On arrival from cath lab, hematoma noted proximal to TR band; Sophia Ward and Sophia Ward from cath lab in to see client and they readjusted tr band; Dr Irish Lack in and second TR band placed proximal to first tr band; good pulse ox waveform, capillary refill brisk and hand color pink;no c/o pain

## 2020-01-30 ENCOUNTER — Encounter (HOSPITAL_COMMUNITY): Payer: Self-pay | Admitting: Interventional Cardiology

## 2020-02-04 ENCOUNTER — Other Ambulatory Visit: Payer: Self-pay

## 2020-02-04 DIAGNOSIS — I350 Nonrheumatic aortic (valve) stenosis: Secondary | ICD-10-CM

## 2020-02-05 ENCOUNTER — Other Ambulatory Visit: Payer: Self-pay

## 2020-02-05 ENCOUNTER — Encounter: Payer: Self-pay | Admitting: Rheumatology

## 2020-02-05 ENCOUNTER — Ambulatory Visit (INDEPENDENT_AMBULATORY_CARE_PROVIDER_SITE_OTHER): Payer: Medicare Other | Admitting: Rheumatology

## 2020-02-05 VITALS — BP 114/65 | HR 66 | Resp 16 | Ht 62.0 in | Wt 173.3 lb

## 2020-02-05 DIAGNOSIS — M19041 Primary osteoarthritis, right hand: Secondary | ICD-10-CM | POA: Diagnosis not present

## 2020-02-05 DIAGNOSIS — M0579 Rheumatoid arthritis with rheumatoid factor of multiple sites without organ or systems involvement: Secondary | ICD-10-CM

## 2020-02-05 DIAGNOSIS — Z8719 Personal history of other diseases of the digestive system: Secondary | ICD-10-CM | POA: Diagnosis not present

## 2020-02-05 DIAGNOSIS — M353 Polymyalgia rheumatica: Secondary | ICD-10-CM

## 2020-02-05 DIAGNOSIS — M19042 Primary osteoarthritis, left hand: Secondary | ICD-10-CM

## 2020-02-05 DIAGNOSIS — M19071 Primary osteoarthritis, right ankle and foot: Secondary | ICD-10-CM | POA: Diagnosis not present

## 2020-02-05 DIAGNOSIS — M8589 Other specified disorders of bone density and structure, multiple sites: Secondary | ICD-10-CM

## 2020-02-05 DIAGNOSIS — M170 Bilateral primary osteoarthritis of knee: Secondary | ICD-10-CM | POA: Diagnosis not present

## 2020-02-05 DIAGNOSIS — Z7189 Other specified counseling: Secondary | ICD-10-CM

## 2020-02-05 DIAGNOSIS — M19072 Primary osteoarthritis, left ankle and foot: Secondary | ICD-10-CM

## 2020-02-05 DIAGNOSIS — Z8639 Personal history of other endocrine, nutritional and metabolic disease: Secondary | ICD-10-CM | POA: Diagnosis not present

## 2020-02-05 DIAGNOSIS — Z8679 Personal history of other diseases of the circulatory system: Secondary | ICD-10-CM

## 2020-02-05 DIAGNOSIS — Z79899 Other long term (current) drug therapy: Secondary | ICD-10-CM

## 2020-02-05 DIAGNOSIS — I25119 Atherosclerotic heart disease of native coronary artery with unspecified angina pectoris: Secondary | ICD-10-CM

## 2020-02-05 NOTE — Patient Instructions (Addendum)
COVID-19 vaccine recommendations:  ° °COVID-19 vaccine is recommended for everyone (unless you are allergic to a vaccine component), even if you are on a medication that suppresses your immune system.  ° °If you are on Methotrexate, Cellcept (mycophenolate), Rinvoq, Xeljanz, and Olumiant- hold the medication for 1 week after each vaccine. Hold Methotrexate for 2 weeks after the single dose COVID-19 vaccine.  ° °If you are on Orencia subcutaneous injection - hold medication one week prior to and one week after the first COVID-19 vaccine dose (only).  ° °If you are on Orencia IV infusions- time vaccination administration so that the first COVID-19 vaccination will occur four weeks after the infusion and postpone the subsequent infusion by one week.  ° °If you are on Cyclophosphamide or Rituxan infusions please contact your doctor prior to receiving the COVID-19 vaccine.  ° °Do not take Tylenol or any anti-inflammatory medications (NSAIDs) 24 hours prior to the COVID-19 vaccination.  ° °There is no direct evidence about the efficacy of the COVID-19 vaccine in individuals who are on medications that suppress the immune system.  ° °Even if you are fully vaccinated, and you are on any medications that suppress your immune system, please continue to wear a mask, maintain at least six feet social distance and practice hand hygiene.  ° °If you develop a COVID-19 infection, please contact your PCP or our office to determine if you need antibody infusion. ° °The booster vaccine is now available for immunocompromised patients. It is advised that if you had Pfizer vaccine you should get Pfizer booster.  If you had a Moderna vaccine then you should get a Moderna booster. Johnson and Johnson does not have a booster vaccine at this time. ° °Please see the following web sites for updated information.   ° °https://www.[facility]heumatology.org/Portals/0/Files/COVID-19-Vaccination-Patient-Resources.pdf ° °https://www.rheumatology.org/About-Us/Newsroom/Press-Releases/ID/1159 ° °Standing Labs °We placed an order today for your standing lab work.  ° °Please have your standing labs drawn in November and every 3 months  ° °If possible, please have your labs drawn 2 weeks prior to your appointment so that the provider can discuss your results at your appointment. ° °We have open lab daily °Monday through Thursday from 8:30-12:30 PM and 1:30-4:30 PM and Friday from 8:30-12:30 PM and 1:30-4:00 PM °at the office of Dr. Kaislee Chao, [facility].   °Please be advised, patients with office appointments requiring lab work will take precedents over walk-in lab work.  °If possible, please come for your lab work on Monday and Friday afternoons, as you may experience shorter wait times. °The office is located at [address], [address], [place], [place] [address] °No appointment is necessary.   °Labs are drawn by [facility]. Please bring your co-pay at the time of your lab draw.  You may receive a bill from [facility] for your lab work. ° °If you wish to have your labs drawn at another location, please call the office 24 hours in advance to send orders. ° °If you have any questions regarding directions or hours of operation,  °please call [phone].   °As a reminder, please drink plenty of water prior to coming for your lab work. Thanks! ° ° °

## 2020-02-06 DIAGNOSIS — J189 Pneumonia, unspecified organism: Secondary | ICD-10-CM | POA: Diagnosis not present

## 2020-02-06 DIAGNOSIS — D649 Anemia, unspecified: Secondary | ICD-10-CM | POA: Diagnosis not present

## 2020-02-06 DIAGNOSIS — I5033 Acute on chronic diastolic (congestive) heart failure: Secondary | ICD-10-CM | POA: Diagnosis not present

## 2020-02-06 DIAGNOSIS — N390 Urinary tract infection, site not specified: Secondary | ICD-10-CM | POA: Diagnosis not present

## 2020-02-06 NOTE — Telephone Encounter (Signed)
Procedure cancelled pending work up for CAD and AS.  Kerin Ransom PA-C 02/06/2020 9:03 AM

## 2020-02-07 ENCOUNTER — Ambulatory Visit (HOSPITAL_COMMUNITY)
Admission: RE | Admit: 2020-02-07 | Discharge: 2020-02-07 | Disposition: A | Discharge status: Home / Self Care | Payer: Medicare Other | Source: Ambulatory Visit | Attending: Cardiovascular Disease | Admitting: Cardiovascular Disease

## 2020-02-07 ENCOUNTER — Ambulatory Visit
Admission: RE | Admit: 2020-02-07 | Discharge: 2020-02-07 | Disposition: A | Discharge status: Home / Self Care | Payer: Medicare Other | Source: Ambulatory Visit | Attending: Internal Medicine | Admitting: Internal Medicine

## 2020-02-07 ENCOUNTER — Other Ambulatory Visit: Payer: Self-pay | Admitting: Internal Medicine

## 2020-02-07 DIAGNOSIS — Z01818 Encounter for other preprocedural examination: Secondary | ICD-10-CM | POA: Diagnosis not present

## 2020-02-07 DIAGNOSIS — I700 Atherosclerosis of aorta: Secondary | ICD-10-CM | POA: Diagnosis not present

## 2020-02-07 DIAGNOSIS — I517 Cardiomegaly: Secondary | ICD-10-CM | POA: Diagnosis not present

## 2020-02-07 DIAGNOSIS — J690 Pneumonitis due to inhalation of food and vomit: Secondary | ICD-10-CM

## 2020-02-07 DIAGNOSIS — I350 Nonrheumatic aortic (valve) stenosis: Secondary | ICD-10-CM | POA: Insufficient documentation

## 2020-02-07 DIAGNOSIS — J811 Chronic pulmonary edema: Secondary | ICD-10-CM | POA: Diagnosis not present

## 2020-02-07 DIAGNOSIS — Z952 Presence of prosthetic heart valve: Secondary | ICD-10-CM | POA: Diagnosis not present

## 2020-02-07 DIAGNOSIS — J90 Pleural effusion, not elsewhere classified: Secondary | ICD-10-CM | POA: Diagnosis not present

## 2020-02-07 DIAGNOSIS — K8689 Other specified diseases of pancreas: Secondary | ICD-10-CM | POA: Diagnosis not present

## 2020-02-07 DIAGNOSIS — K5730 Diverticulosis of large intestine without perforation or abscess without bleeding: Secondary | ICD-10-CM | POA: Diagnosis not present

## 2020-02-07 MED ORDER — IOHEXOL 350 MG/ML SOLN
80.0000 mL | Freq: Once | INTRAVENOUS | Status: AC | PRN
  Administered 2020-02-07: 80 mL via INTRAVENOUS

## 2020-02-12 ENCOUNTER — Other Ambulatory Visit: Payer: Self-pay | Admitting: Rheumatology

## 2020-02-12 ENCOUNTER — Encounter: Payer: Medicare Other | Admitting: Surgery

## 2020-02-12 ENCOUNTER — Other Ambulatory Visit: Payer: Self-pay | Admitting: Physician Assistant

## 2020-02-12 NOTE — Telephone Encounter (Signed)
Last Visit: 02/05/2020 Next Visit: 05/06/2020 Labs: 01/20/2020 WBC 13.7, RBC 3.61, Hgb 9.0, Hct 30.5, MCH 34.9, MCHC 29.5 RDW 22.1, Platelets 584 GFR 53   Current Dose per office note 02/05/2020: Methotrexate 2.5 mg 5 tablet every 7 days DX: Rheumatoid arthritis involving multiple sites with positive rheumatoid factor   Okay to refill MTX?

## 2020-02-12 NOTE — Telephone Encounter (Signed)
Last Visit: 02/05/2020 Next Visit: 05/06/2020  Current Dose per office note on 02/05/2020: prednisone 4 mg po daily  Okay to refill per Dr. Estanislado Pandy

## 2020-02-13 ENCOUNTER — Institutional Professional Consult (permissible substitution) (INDEPENDENT_AMBULATORY_CARE_PROVIDER_SITE_OTHER): Payer: Medicare Other | Admitting: Thoracic Surgery (Cardiothoracic Vascular Surgery)

## 2020-02-13 ENCOUNTER — Other Ambulatory Visit: Payer: Self-pay

## 2020-02-13 ENCOUNTER — Encounter: Payer: Self-pay | Admitting: Thoracic Surgery (Cardiothoracic Vascular Surgery)

## 2020-02-13 VITALS — BP 137/75 | HR 80 | Temp 97.6°F | Resp 20 | Ht 62.0 in | Wt 171.0 lb

## 2020-02-13 DIAGNOSIS — I350 Nonrheumatic aortic (valve) stenosis: Secondary | ICD-10-CM | POA: Diagnosis not present

## 2020-02-13 DIAGNOSIS — I2510 Atherosclerotic heart disease of native coronary artery without angina pectoris: Secondary | ICD-10-CM

## 2020-02-13 NOTE — Progress Notes (Addendum)
HE[facility] AND [place] [facility]GERY CONSULTATION REPORT  Primary Cardiologist is Larae Grooms, MD PCP is [name], MD  Chief Complaint  Patient presents with  . Aortic Stenosis    Surgical consult for TAVR v/s AVR/CABG, review all testing  . Coronary Artery Disease    HPI:  Patient is an 86 year old moderately obese female with coronary artery disease status post PCI and stenting of left anterior descending coronary artery following acute myocardial infarction in 2004, aortic stenosis, hypertension, type 2 diabetes mellitus on insulin, hyperlipidemia, arthritis with positive rheumatoid factor on long-term methotrexate and currently on prednisone, and spinal stenosis with chronic back pain who has been referred for surgical consultation to discuss treatment options for management of severe symptomatic aortic stenosis and multivessel coronary artery disease.  Patient's cardiac history dates back to 2004 when she presented with an acute myocardial infarction.  She was treated with PCI and stenting of the left anterior descending coronary artery at that time.  Apparently one of the 2 stents placed with drug-eluting and the other was not.  The patient had early in-stent restenosis and underwent repeat PCI and stenting approximately 5 months later.  She has done well from a cardiac standpoint until recently.  In 2018 she was diagnosed with polymyalgia rheumatica and later found to have rheumatoid arthritis.  She was started on methotrexate and prednisone at that time.  She has been follow-up recently by Dr. Irish Lack for the last several years.  Echocardiogram performed September 2019 revealed normal left ventricular systolic function with ejection fraction estimated 60 to 65%.  The patient was noted to have moderate aortic stenosis with peak velocity across aortic valve measured 3.4 m/s corresponding to mean transvalvular gradient  estimated 22 mmHg and aortic valve area calculated 0.78 cm by VTI.  The DVI was reported 0.32 at that time.  In May of this year the patient was hospitalized with worsening fatigue and exertional shortness of breath.  She was found to have a drop in her hemoglobin to 7.9 from baseline 13.4.  She had no documented history of heme positive stools.  She was transfused packed red blood cells and received iron infusions.  EGD and colonoscopy were performed that revealed no clear evidence for GI bleeding.  Transthoracic echocardiogram performed at that time revealed normal left ventricular function with severe aortic stenosis.  Peak velocity across aortic valve measured 3.8 m/s corresponding to mean transvalvular gradient estimated 32 mmHg and aortic valve area calculated 0.76 cm.  The DVI was reported 0.24 with stroke-volume index 31.  The patient also was noted to have new onset paroxysmal atrial fibrillation.  She spontaneously converted back to sinus rhythm during her hospitalization.  Hemoglobin stabilized and she was ultimately discharged home on Eliquis for long-term anticoagulation.  She was seen in follow-up by Dr. Irish Lack and plans were made for diagnostic cardiac catheterization.  However, she was hospitalized again for pneumonia and urinary tract infection in August.  Hemoglobin has remained stable.  She reportedly has had some recurrence of atrial fibrillation but is for the most part maintaining sinus rhythm.  The patient eventually underwent diagnostic cardiac catheterization on January 29, 2020.  Coronary angiography revealed severe proximal two-vessel coronary artery disease with ostial stenosis of the right coronary artery and 70% proximal stenosis of left anterior descending coronary artery prior to widely patent previously placed stents.  There was nonobstructive disease in the left circumflex territory.  Mean transvalvular gradient across the aortic valve was  measured 16.8 mmHg on pullback  corresponding to aortic valve area calculated 1.59 cm.  Pulmonary artery pressures were moderately elevated.  CT angiography was performed and the patient referred for surgical consultation.  Patient is married and lives locally with her husband in [place].  She is accompanied by her husband and one of her adult daughters for consultation today.  She lives a sedentary lifestyle and does not exercise on a regular basis.  She has problems with chronic arthritis and back pain but she states that she is limited primarily by symptoms of exertional shortness of breath.  She gets short of breath with moderate and occasionally low level activity.  She denies exertional chest pain or chest tightness.  She denies resting shortness of breath, PND, orthopnea, dizzy spells, or syncope.  She reports occasional mild lower extremity edema and occasional palpitations.   Past Medical History:  Diagnosis Date  . Allergic rhinitis   . Anemia 09/2019  . Arthritis    hands  . BPPV (benign paroxysmal positional vertigo)   . Coronary artery disease   . Coronary atherosclerosis of native coronary artery 01/30/2014  . Diabetes mellitus without complication ([place])   . Essential hypertension, benign 01/30/2014  . GERD (gastroesophageal reflux disease)   . Hypertension   . Mixed hyperlipidemia 01/30/2014  . Myocardial infarction ([place]) 2004   mild MI-no damage- had stents  . Osteopenia   . Post-menopausal   . Rheumatoid arthritis ([place])   . Spinal stenosis   . Stenosing tenosynovitis     Past Surgical History:  Procedure Laterality Date  . ABDOMINAL HYSTERECTOMY    . APPENDECTOMY    . BIOPSY  10/25/2019   Procedure: BIOPSY;  Surgeon: Otis Brace, MD;  Location: [facility];  Service: Gastroenterology;;  . BIOPSY  10/26/2019   Procedure: BIOPSY;  Surgeon: Otis Brace, MD;  Location: [facility];  Service: Gastroenterology;;  . CARDIAC CATHETERIZATION  2004  . carpel tunnel    . COLONOSCOPY WITH  PROPOFOL N/A 10/30/2012   Procedure: COLONOSCOPY WITH PROPOFOL;  Surgeon: Garlan Fair, MD;  Location: [facility];  Service: Endoscopy;  Laterality: N/A;  . COLONOSCOPY WITH PROPOFOL N/A 10/26/2019   Procedure: COLONOSCOPY WITH PROPOFOL;  Surgeon: Otis Brace, MD;  Location: [facility];  Service: Gastroenterology;  Laterality: N/A;  . CORONARY ANGIOPLASTY  2004  . DILATION AND CURETTAGE OF UTERUS    . ESOPHAGOGASTRODUODENOSCOPY N/A 10/25/2019   Procedure: ESOPHAGOGASTRODUODENOSCOPY (EGD);  Surgeon: Otis Brace, MD;  Location: [facility] ENDOSCOPY;  Service: Gastroenterology;  Laterality: N/A;  . ESOPHAGOGASTRODUODENOSCOPY (EGD) WITH PROPOFOL N/A 10/30/2012   Procedure: ESOPHAGOGASTRODUODENOSCOPY (EGD) WITH PROPOFOL;  Surgeon: Garlan Fair, MD;  Location: [facility];  Service: Endoscopy;  Laterality: N/A;  . EYE SURGERY     bilateral cataract with lens implant  . EYE SURGERY    . INJECTION OF SILICONE OIL Left 9/41/7408   Procedure: Injection Of Silicone Oil;  Surgeon: Jalene Mullet, MD;  Location: [place];  Service: Ophthalmology;  Laterality: Left;  . INTRAVASCULAR PRESSURE WIRE/FFR STUDY N/A 01/29/2020   Procedure: INTRAVASCULAR PRESSURE WIRE/FFR STUDY;  Surgeon: Jettie Booze, MD;  Location: [place] [facility];  Service: Cardiovascular;  Laterality: N/A;  . left shouler surgery    . PHOTOCOAGULATION WITH LASER Left 02/12/2019   Procedure: Photocoagulation With Laser;  Surgeon: Jalene Mullet, MD;  Location: [place];  Service: Ophthalmology;  Laterality: Left;  . 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 02/12/2019   Procedure: REPAIR OF COMPLEX TRACTION RETINAL DETACHMENT;  Surgeon: Jalene Mullet, MD;  Location: [place];  Service: Ophthalmology;  Laterality: Left;  . RIGHT/LEFT HEART CATH AND CORONARY ANGIOGRAPHY N/A 01/29/2020   Procedure: RIGHT/LEFT HEART CATH AND CORONARY ANGIOGRAPHY;  Surgeon: Jettie Booze, MD;  Location: [place] [facility];  Service:  Cardiovascular;  Laterality: N/A;  . TONSILLECTOMY    . VITRECTOMY 25 GAUGE WITH SCLERAL BUCKLE Left 02/12/2019   Procedure: Vitrectomy 25 Gauge With Scleral Buckle;  Surgeon: Jalene Mullet, MD;  Location: [place];  Service: Ophthalmology;  Laterality: Left;    Family History  Problem Relation Age of Onset  . CAD Mother   . Heart attack Mother   . CAD Father   . Heart Problems Brother        open heart surgery   . Arthritis Daughter        psoriatic arthritis     Social History   Socioeconomic History  . Marital status: Married    Spouse name: Not on file  . Number of children: Not on file  . Years of education: Not on file  . Highest education level: Not on file  Occupational History  . Not on file  Tobacco Use  . Smoking status: Never Smoker  . Smokeless tobacco: Never Used  Vaping Use  . Vaping Use: Never used  Substance and Sexual Activity  . Alcohol use: No  . Drug use: No  . Sexual activity: Not on file  Other Topics Concern  . Not on file  Social History Narrative  . Not on file   Social Determinants of Health   Financial Resource Strain:   . Difficulty of Paying Living Expenses: Not on file  Food Insecurity:   . Worried About Charity fundraiser in the Last Year: Not on file  . Ran Out of Food in the Last Year: Not on file  Transportation Needs:   . Lack of Transportation (Medical): Not on file  . Lack of Transportation (Non-Medical): Not on file  Physical Activity:   . Days of Exercise per Week: Not on file  . Minutes of Exercise per Session: Not on file  Stress:   . Feeling of Stress : Not on file  Social Connections:   . Frequency of Communication with Friends and Family: Not on file  . Frequency of Social Gatherings with Friends and Family: Not on file  . Attends Religious Services: Not on file  . Active Member of Clubs or Organizations: Not on file  . Attends Archivist Meetings: Not on file  . Marital Status: Not on file  Intimate  Partner Violence:   . Fear of Current or Ex-Partner: Not on file  . Emotionally Abused: Not on file  . Physically Abused: Not on file  . Sexually Abused: Not on file    Current Outpatient Medications  Medication Sig Dispense Refill  . acetaminophen (TYLENOL) 650 MG CR tablet Take 1,300 mg by mouth at bedtime.    Marland Kitchen amLODipine (NORVASC) 10 MG tablet Take 1 tablet (10 mg total) by mouth every morning. 90 tablet 3  . apixaban (ELIQUIS) 5 MG TABS tablet Take 1 tablet (5 mg total) by mouth 2 (two) times daily. 180 tablet 1  . atorvastatin (LIPITOR) 40 MG tablet Take 1 tablet (40 mg total) by mouth at bedtime. 90 tablet 3  . B-D ULTRAFINE III SHORT PEN 31G X 8 MM MISC 1 each by Other route in the morning, at noon, in the evening, and at bedtime.     Marland Kitchen  Calcium Carbonate-Vitamin D (CALCIUM + D PO) Take 1 tablet by mouth every morning.    . Cholecalciferol (VITAMIN D3) 125 MCG (5000 UT) CAPS Take 5,000 Units by mouth daily.     Marland Kitchen escitalopram (LEXAPRO) 5 MG tablet Take 5 mg by mouth daily.    Marland Kitchen estradiol (ESTRACE) 0.1 MG/GM vaginal cream Place 1 Applicatorful vaginally daily as needed (driness).     . folic acid (FOLVITE) 1 MG tablet Take 2 tablets (2 mg total) by mouth daily. 180 tablet 2  . insulin lispro (HUMALOG KWIKPEN) 100 UNIT/ML KiwkPen Inject 6 Units into the skin 3 (three) times daily.     Marland Kitchen LEVEMIR FLEXTOUCH 100 UNIT/ML Pen Inject 14 Units into the skin every morning.   0  . methotrexate 2.5 MG tablet TAKE 5 TABLETS BY MOUTH ONCE A WEEK, CAUTION CHEMOTHERAPY, PROTECT FROM LIGHT 60 tablet 0  . metoprolol succinate (TOPROL-XL) 100 MG 24 hr tablet Take 1 tablet (100 mg total) by mouth every morning. 90 tablet 3  . nitroGLYCERIN (NITROSTAT) 0.4 MG SL tablet DISSOLVE ONE TABLET UNDER THE TONGUE EVERY 5 MINUTES AS NEEDED FOR CHEST PAIN.  DO NOT EXCEED A TOTAL OF 3 DOSES IN 15 MINUTES 25 tablet 5  . Omega-3 Fatty Acids (FISH OIL PO) Take 1 capsule by mouth every morning.    . pantoprazole  (PROTONIX) 40 MG tablet Take 1 tablet (40 mg total) by mouth daily after lunch. 30 tablet 1  . predniSONE (DELTASONE) 1 MG tablet TAKE 4 TABLETS BY MOUTH ONCE DAILY WITH BREAKFAST 360 tablet 0  . ramipril (ALTACE) 10 MG tablet Take 10 mg by mouth 2 (two) times daily.      No current facility-administered medications for this visit.    Allergies  Allergen Reactions  . Codeine Nausea And Vomiting    MAKES HER SICK  . Glimepiride Other (See Comments)    Can't remember  . Jardiance [Empagliflozin] Other (See Comments)    Yeast  . Metformin And Related Diarrhea  . Penicillins Rash      Review of Systems:   General:  normal appetite, decreased energy, no weight gain, no weight loss, no fever  Cardiac:  no chest pain with exertion, no chest pain at rest, +SOB with exertion, no resting SOB, no PND, no orthopnea, + palpitations, + arrhythmia, + atrial fibrillation, + LE edema, no dizzy spells, no syncope  Respiratory:  + exertional shortness of breath, no home oxygen, no productive cough, + dry cough, no bronchitis, no wheezing, no hemoptysis, no asthma, no pain with inspiration or cough, no sleep apnea, no CPAP at night  GI:   no difficulty swallowing, no reflux, no frequent heartburn, no hiatal hernia, no abdominal pain, no constipation, + diarrhea, no hematochezia, no hematemesis, no melena  GU:   no dysuria,  + frequency, + recent urinary tract infection, no hematuria, no kidney stones, no kidney disease  Vascular:  no pain suggestive of claudication, no pain in feet, no leg cramps, no varicose veins, no DVT, no non-healing foot ulcer  Neuro:   no stroke, no TIA's, no seizures, no headaches, no temporary blindness one eye,  no slurred speech, no peripheral neuropathy, some chronic pain, no instability of gait, no memory/cognitive dysfunction  Musculoskeletal: + arthritis, + joint swelling, + myalgias, no difficulty walking, normal mobility   Skin:   no rash, no itching, no skin infections,  no pressure sores or ulcerations  Psych:   + anxiety, + depression, no nervousness, no unusual  recent stress  Eyes:   + blurry vision, no floaters, no recent vision changes, + wears glasses or contacts  ENT:   no hearing loss, no loose or painful teeth, no dentures, last saw dentist June 2021  Hematologic:  + easy bruising, no abnormal bleeding, no clotting disorder, no frequent epistaxis  Endocrine:  + diabetes, does check CBG's at home           Physical Exam:   BP 137/75   Pulse 80   Temp 97.6 F (36.4 C) (Skin)   Resp 20   Ht 5\' 2"  (1.575 m)   Wt 171 lb (77.6 kg)   SpO2 95% Comment: RA  BMI 31.28 kg/m   General:  Moderately obese, elderly and somewhat frail-appearing  HEENT:  Unremarkable   Neck:   no JVD, no bruits, no adenopathy   Chest:   clear to auscultation, symmetrical breath sounds, no wheezes, no rhonchi   CV:   RRR, grade III/VI crescendo/decrescendo murmur heard best at RSB,  no diastolic murmur  Abdomen:  soft, non-tender, no masses   Extremities:  warm, well-perfused, pulses not palpable, + mild LE edema  Rectal/GU  Deferred  Neuro:   Grossly non-focal and symmetrical throughout  Skin:   Clean and dry, no rashes, no breakdown   Diagnostic Tests:   ECHOCARDIOGRAM REPORT       Patient Name:  DE LIBMAN Date of Exam: 10/24/2019  Medical Rec #: [id number]     Height:    62.0 in  Accession #:  [id number]     Weight:    166.7 lb  Date of Birth: [birth year]     BSA:     1.769 m  Patient Age:  70 years      BP:      111/70 mmHg  Patient Gender: F         HR:      87 bpm.  Exam Location: Inpatient   Procedure: 2D Echo   Indications:  Atrial Fibrillation 427.31 / I48.91    History:    Patient has prior history of Echocardiogram examinations,  most         recent 02/02/2018. CAD and Previous Myocardial Infarction,  Mitral         Valve Disease; Risk  Factors:Hypertension, Dyslipidemia and         Diabetes.    Sonographer:  [name] RDCS  Referring Phys: [place]    1. Left ventricular ejection fraction, by estimation, is 60 to 65%. The  left ventricle has normal function. The left ventricle has no regional  wall motion abnormalities. Left ventricular diastolic function could not  be evaluated. Elevated left  ventricular end-diastolic pressure.  2. Right ventricular systolic function is normal. The right ventricular  size is normal. There is moderately elevated pulmonary artery systolic  pressure.  3. Left atrial size was mildly dilated.  4. The mitral valve is abnormal. Trivial mitral valve regurgitation.  5. The aortic valve is tricuspid. Aortic valve regurgitation is trivial.  Severe aortic valve stenosis. Aortic valve area, by VTI measures 0.76 cm.  Aortic valve mean gradient measures 32.0 mmHg. Aortic valve Vmax measures  3.77 m/s.  6. The inferior vena cava is dilated in size with <50% respiratory  variability, suggesting right atrial pressure of 15 mmHg.   Comparison(s): Changes from prior study are noted. 02/02/2018: LVEF 60-65%,  moderate AS - mean gradient 22 mmHg.   FINDINGS  Left Ventricle:  Left ventricular ejection fraction, by estimation, is 60  to 65%. The left ventricle has normal function. The left ventricle has no  regional wall motion abnormalities. The left ventricular internal cavity  size was normal in size. There is  no left ventricular hypertrophy. Left ventricular diastolic function  could not be evaluated due to indeterminate diastolic function. Left  ventricular diastolic function could not be evaluated. Elevated left  ventricular end-diastolic pressure.   Right Ventricle: The right ventricular size is normal. No increase in  right ventricular wall thickness. Right ventricular systolic function is  normal. There is moderately elevated pulmonary  artery systolic pressure.  The tricuspid regurgitant velocity is  2.80 m/s, and with an assumed right atrial pressure of 15 mmHg, the  estimated right ventricular systolic pressure is 26.7 mmHg.   Left Atrium: Left atrial size was mildly dilated.   Right Atrium: Right atrial size was normal in size.   Pericardium: There is no evidence of pericardial effusion.   Mitral Valve: The mitral valve is abnormal. There is mild calcification of  the posterior mitral valve leaflet(s). Mild to moderate mitral annular  calcification. Trivial mitral valve regurgitation.   Tricuspid Valve: The tricuspid valve is grossly normal. Tricuspid valve  regurgitation is mild.   Aortic Valve: The aortic valve is tricuspid. Aortic valve regurgitation is  trivial. Severe aortic stenosis is present. Aortic valve mean gradient  measures 32.0 mmHg. Aortic valve peak gradient measures 56.9 mmHg. Aortic  valve area, by VTI measures 0.76  cm.   Pulmonic Valve: The pulmonic valve was normal in structure. Pulmonic valve  regurgitation is trivial.   Aorta: The aortic root and ascending aorta are structurally normal, with  no evidence of dilitation.   Venous: The inferior vena cava is dilated in size with less than 50%  respiratory variability, suggesting right atrial pressure of 15 mmHg.   IAS/Shunts: No atrial level shunt detected by color flow Doppler.     LEFT VENTRICLE  PLAX 2D  LVIDd:     4.50 cm Diastology  LVIDs:     3.20 cm LV e' lateral:  4.62 cm/s  LV PW:     1.00 cm LV E/e' lateral: 27.2  LV IVS:    1.00 cm LV e' medial:  4.70 cm/s  LVOT diam:   2.00 cm LV E/e' medial: 26.7  LV SV:     54  LV SV Index:  31  LVOT Area:   3.14 cm     RIGHT VENTRICLE  RV S prime:   8.51 cm/s  TAPSE (M-mode): 1.4 cm   LEFT ATRIUM       Index    RIGHT ATRIUM      Index  LA diam:    4.00 cm 2.26 cm/m RA Area:   16.10 cm  LA Vol (A2C):  44.4 ml  25.10 ml/m RA Volume:  41.80 ml 23.63 ml/m  LA Vol (A4C):  63.4 ml 35.84 ml/m  LA Biplane Vol: 54.5 ml 30.81 ml/m  AORTIC VALVE  AV Area (Vmax):  0.80 cm  AV Area (Vmean):  0.70 cm  AV Area (VTI):   0.76 cm  AV Vmax:      377.00 cm/s  AV Vmean:     272.000 cm/s  AV VTI:      0.713 m  AV Peak Grad:   56.9 mmHg  AV Mean Grad:   32.0 mmHg  LVOT Vmax:     95.80 cm/s  LVOT Vmean:    60.800  cm/s  LVOT VTI:     0.172 m  LVOT/AV VTI ratio: 0.24    AORTA  Ao Root diam: 2.80 cm   MITRAL VALVE        TRICUSPID VALVE  MV Area (PHT): 3.90 cm   TR Peak grad:  31.4 mmHg  MV Decel Time: 195 msec   TR Vmax:    280.00 cm/s  MV E velocity: 125.50 cm/s  MV A velocity: 62.25 cm/s  SHUNTS  MV E/A ratio: 2.02     Systemic VTI: 0.17 m               Systemic Diam: 2.00 cm   Lyman Bishop MD  Electronically signed by Lyman Bishop MD  Signature Date/Time: 10/24/2019/5:10:34 PM      INTRAVASCULAR PRESSURE WIRE/FFR STUDY  RIGHT/LEFT HEART CATH AND CORONARY ANGIOGRAPHY  Conclusion    Ost LAD to Prox LAD lesion is 70% stenosed. Significant by DFR, 0.74.  Distal area of Previously placed Mid LAD stent (unknown type) is widely patent.  Dist LM to Ost LAD lesion is 25% stenosed.  Ost RCA to Prox RCA lesion is 75% stenosed.  LV end diastolic pressure is mildly elevated.  Hemodynamic findings consistent with moderate pulmonary hypertension.  Ao 90%, PA sat 62%, CO 6.9 L/min; CI 3.88; PA pressure 58/16, mean PA pressure 37 mm Hg; mean PCWP 22- significant V waves noted on wedge tracing   Severe proximal, two vessel disease.  Ostial RCA is favorable for PCI.  Proximal LAD would be more technically challenging.   Restrictive physiology likely given V waves.   Plan for surgical consult to decide about CABG/AVR vs PCI/TAVR.  Case d/w Dr. Burt Knack as well.   Recommendations  Antiplatelet/Anticoag  Recommend to resume Apixaban, at currently prescribed dose and frequency on 01/31/2020. Concurrent antiplatelet therapy not recommended. Given anemia, will hold off on aspirin at this time.  Discharge Date In the absence of any other complications or medical issues, we expect the patient to be ready for discharge from a cath perspective on 01/29/2020.  Indications  Coronary artery disease involving native coronary artery of native heart with other form of angina pectoris (HCC) [I25.118 (ICD-10-CM)]  Severe aortic stenosis [I35.0 (ICD-10-CM)]  Procedural Details  Technical Details The risks, benefits, and details of the procedure were explained to the patient.  The patient verbalized understanding and wanted to proceed.  Informed written consent was obtained.  PROCEDURE TECHNIQUE:  After Xylocaine anesthesia, a 5 French sheath was placed in the right antecubital area in exchange for a peripheral IV. A 5 French balloontipped Swan-Ganz catheter was advanced to the pulmonary artery under fluoroscopic guidance. Hemodynamic pressures were obtained. Oxygen saturations were obtained. After Xylocaine anesthesia, a 3F sheath was placed in the right femoral artery with a single anterior needle wall stick.   Left coronary angiography was done using a Judkins L3.5 guide catheter.  Right coronary angiography was done using a Judkins R4 guide catheter.  Left heart cath was done using a AL1 catheter. Straight wire was used to cross the aortic valve.  TR band to right radial artery.  IV heparin was given for DFR of LAD.  DFR 0.74.  Comet wire used for DFR on LAD.  Advanced to distal vessel and DFR measured.     Contrast: 75 cc  Estimated blood loss <50 mL.   During this procedure medications were administered to achieve and maintain moderate conscious sedation while the patient's heart rate, blood pressure, and oxygen saturation were  continuously monitored and I was present face-to-face 100% of this time.    Medications (Filter: Administrations occurring from 1358 to 1544 on 01/29/20) Heparin (Porcine) in NaCl 1000-0.9 UT/500ML-% SOLN (mL) Total volume:  1,000 mL Date/Time  Rate/Dose/Volume Action  01/29/20 1414  500 mL Given  1414  500 mL Given    fentaNYL (SUBLIMAZE) injection (mcg) Total dose:  25 mcg Date/Time  Rate/Dose/Volume Action  01/29/20 1418  25 mcg Given    midazolam (VERSED) injection (mg) Total dose:  1 mg Date/Time  Rate/Dose/Volume Action  01/29/20 1418  1 mg Given    lidocaine (PF) (XYLOCAINE) 1 % injection (mL) Total volume:  4 mL Date/Time  Rate/Dose/Volume Action  01/29/20 1427  2 mL Given  1429  2 mL Given    heparin sodium (porcine) injection (Units) Total dose:  10,000 Units Date/Time  Rate/Dose/Volume Action  01/29/20 1436  4,000 Units Given  1518  6,000 Units Given    Radial Cocktail/Verapamil only (mL) Total volume:  10 mL Date/Time  Rate/Dose/Volume Action  01/29/20 1434  10 mL Given    iohexol (OMNIPAQUE) 350 MG/ML injection (mL) Total volume:  75 mL Date/Time  Rate/Dose/Volume Action  01/29/20 1531  75 mL Given    Sedation Time  Sedation Time Physician-1: 1 hour 9 minutes 33 seconds  Contrast  Medication Name Total Dose  iohexol (OMNIPAQUE) 350 MG/ML injection 75 mL    Radiation/Fluoro  Fluoro time: 14.5 (min) DAP: 24281 (mGycm2) Cumulative Air Kerma: 341 (mGy)  Complications  Complications documented before study signed (01/29/2020 93:79 PM)   No complications were associated with this study.  Documented by Jettie Booze, MD - 01/29/2020 4:04 PM    Coronary Findings  Diagnostic Dominance: Right Left Main  Dist LM to Ost LAD lesion is 25% stenosed.  Left Anterior Descending  Ost LAD to Prox LAD lesion is 70% stenosed.  Prox LAD lesion is 70% stenosed. The lesion was previously treatedover 2 years ago.  Previously placed Mid LAD stent (unknown type) is widely patent.  Left Circumflex  There is mild diffuse disease  throughout the vessel.  Right Coronary Artery  Vessel is large. There is mild diffuse disease throughout the vessel.  Ost RCA to Prox RCA lesion is 75% stenosed. The lesion is type C and discrete. The lesion was previously treatedover 2 years ago.  Intervention  No interventions have been documented. Right Heart  Right Heart Pressures Hemodynamic findings consistent with moderate pulmonary hypertension. Ao 90%, PA sat 62%, CO 6.9 L/min; CI 3.88; PA pressure 58/16, mean PA pressure 37 mm Hg; mean PCWP 22- significant V waves noted on wedge tracing  Left Heart  Left Ventricle LV end diastolic pressure is mildly elevated.  Aortic Valve Moderate aortic stenosis by cath.  Coronary Diagrams  Diagnostic Dominance: Right  Intervention  Implants   No implant documentation for this case.  Syngo Images  Show images for CARDIAC CATHETERIZATION Images on Long Term Storage  Show images for [place], [place] to Procedure Log  Procedure Log    Hemo Data   Most Recent Value  Fick Cardiac Output 6.92 L/min  Fick Cardiac Output Index 3.88 (L/min)/BSA  Aortic Mean Gradient 16.79 mmHg  Aortic Peak Gradient 17 mmHg  Aortic Valve Area 1.59  Aortic Value Area Index 0.89 cm2/BSA  RA A Wave 12 mmHg  RA V Wave 11 mmHg  RA Mean 9 mmHg  RV Systolic Pressure 55 mmHg  RV Diastolic Pressure 5 mmHg  RV EDP  11 mmHg  PA Systolic Pressure 58 mmHg  PA Diastolic Pressure 16 mmHg  PA Mean 37 mmHg  PW A Wave 17 mmHg  PW V Wave 43 mmHg  PW Mean 22 mmHg  AO Systolic Pressure 950 mmHg  AO Diastolic Pressure 52 mmHg  AO Mean 82 mmHg  LV Systolic Pressure 932 mmHg  LV Diastolic Pressure 14 mmHg  LV EDP 17 mmHg  AOp Systolic Pressure 671 mmHg  AOp Diastolic Pressure 54 mmHg  AOp Mean Pressure 89 mmHg  LVp Systolic Pressure 245 mmHg  LVp Diastolic Pressure 17 mmHg  LVp EDP Pressure 23 mmHg  QP/QS 1  TPVR Index 9.54 HRUI  TSVR Index 21.13 HRUI  PVR SVR Ratio 0.21  TPVR/TSVR Ratio 0.45      EKG: NSR w/out significant AV conduction delay (01/30/2020)    Cardiac TAVR CT  TECHNIQUE: The patient was scanned on a Siemens Force 809 slice scanner. A 120 kV retrospective scan was triggered in the descending thoracic aorta at 111 HU's. Gantry rotation speed was 270 msecs and collimation was .9 mm. No beta blockade or nitro were given. The 3D data set was reconstructed in 5% intervals of the R-R cycle. Systolic and diastolic phases were analyzed on a dedicated work station using MPR, MIP and VRT modes. The patient received 80 cc of contrast.  FINDINGS: Aortic Valve: Tri leaflet calcified with restricted leaflet motion Calcium score 1211  Aorta: No aneurysm, normal arch vessels moderate calcific atherosclerosis  Sinotubular Junction: 25 mm with moderate calcification  Ascending Thoracic Aorta: 28 mm  Aortic Arch: 24 mm  Descending Thoracic Aorta: 24 mm  Sinus of Valsalva Measurements:  Non-coronary: 27.3 mm  Right - coronary: 25.3 mm  Left - coronary: 26.7 mm  Coronary Artery Height above Annulus:  Left Main: 11 mm above annulus  Right Coronary: 12.4 mm above annulus  Virtual Basal Annulus Measurements:  Maximum/Minimum Diameter: 23.7 mm x 17.8 mm  Perimeter: 68.6 mm  Area: 341 mm 2  Coronary Arteries: Sufficient height above annulus for deployment  Optimum Fluoroscopic Angle for Delivery: LAO 22 Caudal 6 degrees  IMPRESSION: 1. Calcified tri leaflet AV with annular area of 341 mm2 lower end for 23 mm Sapien 3 valve alternatively would consider a 26 mm Evolut Pro valve  2.  Coronary arteries sufficient height for deployment  3. Optimum angiographic angle for deployment LAO 22 Caudal 6 degrees  4.  Normal aortic root 2.8 cm  Jenkins Rouge   Electronically Signed   By: Jenkins Rouge M.D.   On: 02/07/2020 13:17   CT ANGIOGRAPHY CHEST, ABDOMEN AND PELVIS  TECHNIQUE: Multidetector CT imaging through the chest,  abdomen and pelvis was performed using the standard protocol during bolus administration of intravenous contrast. Multiplanar reconstructed images and MIPs were obtained and reviewed to evaluate the vascular anatomy.  CONTRAST:  56mL OMNIPAQUE IOHEXOL 350 MG/ML SOLN  COMPARISON:  CT the abdomen and pelvis 10/22/2019. No prior chest CT.  FINDINGS: CTA CHEST FINDINGS  Cardiovascular: Heart size is mildly enlarged. Trace amount of pericardial fluid and/or thickening, unlikely to be of any hemodynamic significance at this time. No associated pericardial calcification. There is aortic atherosclerosis, as well as atherosclerosis of the great vessels of the mediastinum and the coronary arteries, including calcified atherosclerotic plaque in the left main, left anterior descending, left circumflex and right coronary arteries. Thickening calcification of the aortic valve.  Mediastinum/Lymph Nodes: Multiple prominent borderline enlarged mediastinal and bilateral hilar lymph nodes, nonspecific. Esophagus is unremarkable  in appearance. No axillary lymphadenopathy.  Lungs/Pleura: A few scattered tiny 1-2 mm pulmonary nodules are noted throughout the periphery of the lungs bilaterally, nonspecific, but statistically likely to represent areas of mucoid impaction within terminal bronchioles. Mild ground-glass attenuation and interlobular septal thickening scattered throughout the lungs bilaterally, likely to reflect a background of mild interstitial pulmonary edema. No confluent consolidative airspace disease. Small bilateral pleural effusions lying dependently.  Musculoskeletal/Soft Tissues: Old healed nondisplaced fracture of the inferior aspect of the sternum. There are no aggressive appearing lytic or blastic lesions noted in the visualized portions of the skeleton.  CTA ABDOMEN AND PELVIS FINDINGS  Hepatobiliary: No suspicious cystic or solid hepatic lesions. Tiny calcified  granuloma in the left lobe of the liver incidentally noted. No intra or extrahepatic biliary ductal dilatation. Tiny calcified gallstones lying dependently in the gallbladder. No findings to suggest an acute cholecystitis at this time.  Pancreas: In the inferior aspect of the uncinate process of the pancreas there is a 1.1 x 1.0 cm intermediate attenuation (44 HU) lesion (axial image 137 of series 15). No other pancreatic mass. No pancreatic ductal dilatation. No peripancreatic fluid collections or inflammatory changes are noted.  Spleen: Unremarkable.  Adrenals/Urinary Tract: Bilateral kidneys and right adrenal gland are normal in appearance. 1.1 x 0.9 cm left adrenal nodule, stable compared to the prior CT examination, previously -11 HU (compatible with an adenoma). No hydroureteronephrosis. Urinary bladder is normal in appearance.  Stomach/Bowel: The appearance of the stomach is normal. No pathologic dilatation of small bowel or colon. Numerous colonic diverticulae are noted, without surrounding inflammatory changes to suggest an acute diverticulitis at this time. Status post appendectomy.  Vascular/Lymphatic: Aortic atherosclerosis, without evidence of aneurysm or dissection in the abdominal or pelvic vasculature. Vascular findings and measurements pertinent to potential TAVR procedure, as detailed below. No lymphadenopathy noted in the abdomen or pelvis.  Reproductive: Status post hysterectomy. Ovaries are not confidently identified may be surgically absent or atrophic.  Other: Tiny umbilical hernia containing only omental fat. No significant volume of ascites. No pneumoperitoneum.  Musculoskeletal: There are no aggressive appearing lytic or blastic lesions noted in the visualized portions of the skeleton.  VASCULAR MEASUREMENTS PERTINENT TO TAVR:  AORTA:  Minimal Aortic Diameter-12 x 11 mm  Severity of Aortic Calcification-severe  RIGHT  PELVIS:  Right Common Iliac Artery -  Minimal Diameter-8.0 x 8.1 mm  Tortuosity-mild  Calcification-moderate  Right External Iliac Artery -  Minimal Diameter-6.9 x 6.5 mm  Tortuosity-moderate  Calcification-none  Right Common Femoral Artery -  Minimal Diameter-7.0 x 6.6 mm  Tortuosity-mild  Calcification-mild  LEFT PELVIS:  Left Common Iliac Artery -  Minimal Diameter-9.9 x 8.8 mm  Tortuosity-mild  Calcification-mild  Left External Iliac Artery -  Minimal Diameter-7.7 x 7.1 mm  Tortuosity-moderate  Calcification-none  Left Common Femoral Artery -  Minimal Diameter-7.1 x 5.7 mm  Tortuosity-mild  Calcification-mild  Review of the MIP images confirms the above findings.  IMPRESSION: 1. Vascular findings and measurements pertinent to potential TAVR procedure, as detailed above. 2. Severe thickening calcification of the aortic valve, compatible with reported clinical history of severe aortic stenosis. 3. Trace amount of pericardial fluid and/or thickening, unlikely to be of any hemodynamic significance at this time. No pericardial calcification. 4. Mild cardiomegaly with evidence of mild interstitial pulmonary edema and small bilateral pleural effusions; imaging findings that could suggest mild congestive heart failure. 5. Tiny 1-2 mm pulmonary nodules scattered throughout the periphery of the lungs bilaterally, nonspecific, but statistically likely benign.  No follow-up needed if patient is low-risk (and has no known or suspected primary neoplasm). Non-contrast chest CT can be considered in 12 months if patient is high-risk. This recommendation follows the consensus statement: Guidelines for Management of Incidental Pulmonary Nodules Detected on CT Images: From the Fleischner Society 2017; Radiology 2017; 284:228-243. 6. 1.1 x 1.0 cm intermediate attenuation lesion in the inferior aspect of the uncinate process of the  pancreas. This is nonspecific, but favored to represent a benign lesions such as a small pancreatic pseudocyst. Follow-up evaluation with pancreatic protocol CT scan or abdominal MRI with and without IV gadolinium with MRCP in 2 years is recommended to ensure the stability of this lesion. This recommendation follows ACR consensus guidelines: Management of Incidental Pancreatic Cysts: A White Paper of the ACR Incidental Findings Committee. J Am Coll Radiol 7062;37:628-315. 7. Colonic diverticulosis without evidence of acute diverticulitis at this time. 8. Additional incidental findings, as above.   Electronically Signed   By: Vinnie Langton M.D.   On: 02/07/2020 13:56  __________________  STS score Procedure: AVR + CAB Risk of Mortality: 16.749% Renal Failure: 13.400% Permanent Stroke: 2.504% Prolonged Ventilation: 30.284% DSW Infection: 3.990% Reoperation: 5.915% Morbidity or Mortality: 43.043% Short Length of Stay: 2.668% Long Length of Stay: 50.942%   Impression:  Patient has stage D3 severe symptomatic aortic stenosis with multivessel coronary artery disease.  She describes stable but somewhat progressive symptoms of exertional shortness of breath and fatigue consistent with chronic diastolic congestive heart failure, New [place] Heart Association functional class IIb-III. she denies symptoms of exertional chest pain or chest tightness, although some of her symptoms of exertional shortness of breath could be considered anginal equivalent.  The patient's clinical presentation is further complicated by the recent diagnosis of recurrent paroxysmal atrial fibrillation on long-term anticoagulation using Eliquis as well as history of symptomatic anemia requiring blood transfusion and iron infusions but no documented episodes of GI bleeding.  I have personally reviewed the patient's recent transthoracic echocardiogram, diagnostic cardiac catheterization, EKGs, and CT  angiograms.  Echocardiogram confirms the presence of severe aortic stenosis.  The aortic valve is trileaflet with severe thickening, calcification, and restricted leaflet mobility involving all 3 leaflets of the aortic valve.  Peak velocity across aortic valve measured close to 3.8 m/s corresponding to mean transvalvular gradient estimated 32 mmHg but aortic valve area calculated only 0.76 cm.  The DVI was fairly low at 0.24 with stroke-volume index only 31.  Diagnostic cardiac catheterization confirmed the presence of aortic stenosis but also revealed significant multivessel coronary artery disease including 70% proximal stenosis of the left anterior descending coronary artery that was found to be hemodynamically significant on FFR evaluation.  Stents previously placed in the mid left anterior descending coronary artery were widely patent without disease.  The patient also had significant ostial stenosis of the right coronary artery.  Right heart pressures were notable for moderate pulmonary hypertension.  Cardiac-gated CTA of the heart reveals anatomical characteristics consistent with aortic stenosis suitable for treatment by transcatheter aortic valve replacement without any significant complicating features, although the size of the patient's aortic root and aortic annulus was relatively small and there was significant calcification noted in the sinotubular junction and the aortic root.  Calcification did not extend into the aortic annulus or LV outflow tract.  CTA of the aorta and iliac vessels demonstrate what appears to be adequate pelvic vascular access to facilitate a transfemoral approach.  Most recent EKG revealed sinus rhythm without significant AV conduction delay.  I agree the patient would benefit from aortic valve replacement.  I also feel that concomitant treatment of the patient's coronary artery disease is indicated at this time.  Risks associated with conventional surgical aortic valve  replacement and coronary artery bypass grafting with or without concomitant surgical treatment of the patient's atrial fibrillation would be significant given the patient's advanced age, comorbid medical problems, and significant physical deconditioning.  Moreover, the patient's aortic valve and aortic root are relatively small in size and associated with some calcification in the sinotubular junction.  She might require aortic root enlargement if not aortic root replacement to avoid the development of patient prosthesis mismatch following conventional surgical aortic valve replacement.  Under the circumstances, I favor staged PCI and stenting followed by transcatheter aortic valve replacement as much less invasive approach to her treatment.    Plan:  The patient and her family were counseled at length regarding treatment alternatives for management of severe symptomatic aortic stenosis and multivessel coronary artery disease. Alternative approaches such as conventional aortic valve replacement with coronary artery bypass grafting, transcatheter aortic valve replacement with or without PCI and stenting, and continued medical therapy without intervention were compared and contrasted at length.  The risks associated with conventional surgery were discussed in detail, as were expectations for post-operative convalescence, and why I would be reluctant to consider this patient a candidate for conventional surgery.  Issues specific to transcatheter aortic valve replacement were discussed including questions about long term valve durability, the potential for paravalvular leak, possible increased risk of need for permanent pacemaker placement, and other technical complications related to the procedure itself.  Concerns regarding the presence of significant coronary artery disease were discussed as well as the rationale for my recommendation that PCI and stenting be performed prior to transcatheter aortic valve  replacement.  Issues related to the need for long-term anticoagulation following these procedures were discussed.  Long-term prognosis with medical therapy was discussed. This discussion was placed in the context of the patient's own specific clinical presentation and past medical history.  All of their questions have been addressed.  The patient is interested in proceeding and PCI and stenting in the near future followed by transcatheter aortic valve replacement.  Following the decision to proceed with transcatheter aortic valve replacement, a discussion has been held regarding what types of management strategies would be attempted intraoperatively in the event of life-threatening complications, including whether or not the patient would be considered a candidate for the use of cardiopulmonary bypass and/or conversion to open sternotomy for attempted surgical intervention.  The patient specifically requests that should a potentially life-threatening complication develop we would not attempt emergency median sternotomy and/or other aggressive surgical procedures.  The patient has been advised of a variety of complications that might develop including but not limited to risks of death, stroke, paravalvular leak, aortic dissection or other major vascular complications, aortic annulus rupture, device embolization, cardiac rupture or perforation, mitral regurgitation, acute myocardial infarction, arrhythmia, heart block or bradycardia requiring permanent pacemaker placement, congestive heart failure, respiratory failure, renal failure, pneumonia, infection, other late complications related to structural valve deterioration or migration, or other complications that might ultimately cause a temporary or permanent loss of functional independence or other long term morbidity.  The patient provides full informed consent for the procedure as described and all questions were answered.      I spent in excess of 90  minutes during the conduct of this office consultation and >50% of this time involved direct  face-to-face encounter with the patient for counseling and/or coordination of their care.      Valentina Gu. Roxy Manns, MD 02/13/2020 10:03 AM

## 2020-02-13 NOTE — Patient Instructions (Signed)
Continue all previous medications without any changes at this time  

## 2020-02-14 ENCOUNTER — Telehealth: Payer: Self-pay

## 2020-02-14 DIAGNOSIS — I350 Nonrheumatic aortic (valve) stenosis: Secondary | ICD-10-CM

## 2020-02-14 DIAGNOSIS — I25119 Atherosclerotic heart disease of native coronary artery with unspecified angina pectoris: Secondary | ICD-10-CM

## 2020-02-14 NOTE — Telephone Encounter (Signed)
Called and spoke to patient she has been scheduled for PCI on 9/24 with Dr. Irish Lack. She will get repeat labs and COVID test done on 9/22. Reviewed instructions below with the patient. Made her aware that Ander Purpura will contact her after the PCI to make necessary arrangements for TAVR. She verbalized understanding and thanked me for the call.      [facility] [place], [place] [address] Dept: [phone] Loc: [place]  02/14/2020  You are scheduled for a Cardiac Catheterization on Friday, September 24 with Dr. Larae Grooms.  1. Please arrive at the [facility] (Main Entrance A) at [facility]: [address] [place], [place] [address] at 5:30 AM (This time is two hours before your procedure to ensure your preparation). Free valet parking service is available.   Special note: Every effort is made to have your procedure done on time. Please understand that emergencies sometimes delay scheduled procedures.  2. Diet: Do not eat solid foods after midnight.  The patient may have clear liquids until 5am upon the day of the procedure.  3. Labs: You will need to have blood drawn on 02/19/20 for CBC, BMET  Due to recent COVID-19 restrictions implemented by our local and state authorities and in an effort to keep both patients and staff as safe as possible, our hospital system requires COVID-19 testing prior to certain scheduled hospital procedures.  Please go to [facility]. [place], [place] [address] on 02/19/20 at 10:00 AM .  This is a drive up testing site.  You will not need to exit your vehicle.  You will not be billed at the time of testing but may receive a bill later depending on your insurance. You must agree to self-quarantine from the time of your testing until the procedure date on 02/21/20.  This should included staying home with ONLY the people you  live with.  Avoid take-out, grocery store shopping or leaving the house for any non-emergent reason.  Failure to have your COVID-19 test done on the date and time you have been scheduled will result in cancellation of your procedure.  Please call our office at [phone] if you have any questions.   4. Medication instructions in preparation for your procedure:   Contrast Allergy: No  HOLD Eliquis for 2 days prior to procedure  DO NOT take insulin the day of the procedure  DO NOT take the levemir the morning of the procedure  DO NOT take ramipril the morning of the procedure  On the morning of your procedure, take a baby Aspirin 81 mg and any morning medicines NOT listed above.  You may use sips of water.  5. Plan for one night stay--bring personal belongings. 6. Bring a current list of your medications and current insurance cards. 7. You MUST have a responsible person to drive you home. 8. Someone MUST be with you the first 24 hours after you arrive home or your discharge will be delayed. 9. Please wear clothes that are easy to get on and off and wear slip-on shoes.  Thank you for allowing [place] to care for you!   -- [facility]

## 2020-02-14 NOTE — Telephone Encounter (Signed)
-----   Message from Jettie Booze, MD sent at 02/14/2020  3:47 PM EDT ----- Regarding: RE: PCI plan Yes please.  Thank you.  JV ----- Message ----- From: Barkley Boards, RN Sent: 02/14/2020  10:38 AM EDT To: Jettie Booze, MD, Sherren Mocha, MD, # Subject: PCI plan                                       Hey Dr Clayton Bibles,  I wanted to see if you would like to arrange PCI with you for Sophia Ward.  Dr Roxy Manns evaluated her yesterday and recommended PCI and staged TAVR.    Thanks, Lauren  ----- Message ----- From: Rexene Alberts, MD Sent: 02/13/2020   1:05 PM EDT To: Barkley Boards, RN, Bo Merino, MD, #  Please see the attached note regarding our mutual patient from their visit in our office today.  CHO

## 2020-02-19 ENCOUNTER — Other Ambulatory Visit: Payer: Self-pay

## 2020-02-19 ENCOUNTER — Other Ambulatory Visit (HOSPITAL_COMMUNITY)
Admission: RE | Admit: 2020-02-19 | Discharge: 2020-02-19 | Disposition: A | Discharge status: Home / Self Care | Payer: Medicare Other | Source: Ambulatory Visit | Attending: Interventional Cardiology | Admitting: Interventional Cardiology

## 2020-02-19 ENCOUNTER — Other Ambulatory Visit: Payer: Medicare Other

## 2020-02-19 DIAGNOSIS — Z01812 Encounter for preprocedural laboratory examination: Secondary | ICD-10-CM | POA: Diagnosis not present

## 2020-02-19 DIAGNOSIS — Z20822 Contact with and (suspected) exposure to covid-19: Secondary | ICD-10-CM | POA: Insufficient documentation

## 2020-02-19 DIAGNOSIS — I25119 Atherosclerotic heart disease of native coronary artery with unspecified angina pectoris: Secondary | ICD-10-CM

## 2020-02-19 DIAGNOSIS — I350 Nonrheumatic aortic (valve) stenosis: Secondary | ICD-10-CM

## 2020-02-19 LAB — CBC
Hematocrit: 27.1 % — ABNORMAL LOW (ref 34.0–46.6)
Hemoglobin: 8.3 g/dL — ABNORMAL LOW (ref 11.1–15.9)
MCH: 26.7 pg (ref 26.6–33.0)
MCHC: 30.6 g/dL — ABNORMAL LOW (ref 31.5–35.7)
MCV: 87 fL (ref 79–97)
Platelets: 405 10*3/uL (ref 150–450)
RBC: 3.11 x10E6/uL — ABNORMAL LOW (ref 3.77–5.28)
RDW: 17.9 % — ABNORMAL HIGH (ref 11.7–15.4)
WBC: 11.8 10*3/uL — ABNORMAL HIGH (ref 3.4–10.8)

## 2020-02-19 LAB — BASIC METABOLIC PANEL
BUN/Creatinine Ratio: 15 (ref 12–28)
BUN: 17 mg/dL (ref 8–27)
CO2: 23 mmol/L (ref 20–29)
Calcium: 9.5 mg/dL (ref 8.7–10.3)
Chloride: 105 mmol/L (ref 96–106)
Creatinine, Ser: 1.13 mg/dL — ABNORMAL HIGH (ref 0.57–1.00)
GFR calc Af Amer: 52 mL/min/{1.73_m2} — ABNORMAL LOW (ref >59)
GFR calc non Af Amer: 45 mL/min/{1.73_m2} — ABNORMAL LOW (ref >59)
Glucose: 132 mg/dL — ABNORMAL HIGH (ref 65–99)
Potassium: 4.1 mmol/L (ref 3.5–5.2)
Sodium: 140 mmol/L (ref 134–144)

## 2020-02-19 LAB — SARS CORONAVIRUS 2 (TAT 6-24 HRS): SARS Coronavirus 2: NEGATIVE

## 2020-02-20 ENCOUNTER — Telehealth: Payer: Self-pay | Admitting: *Deleted

## 2020-02-20 MED ORDER — CLOPIDOGREL BISULFATE 75 MG PO TABS: ORAL_TABLET | ORAL | 0 refills | Status: DC

## 2020-02-20 NOTE — Telephone Encounter (Addendum)
Pt contacted pre-catheterization scheduled at [facility] for: Friday February 21, 2020 8:15 AM Verified arrival time and place: [facility]) at: 6:15 AM   No solid food after midnight prior to cath, clear liquids until 5 AM day of procedure.  Hold: Eliquis-none 02/19/20 until post procedure Insulin -AM of procedure Ramipril-PM prior and AM of procedure-GFR 45  Except hold medications AM meds can be  taken pre-cath with sips of water including: ASA 81 mg Plavix 75 mg  Confirmed patient has responsible adult to drive home post procedure and be with patient first 24 hours after arriving home: yes  You are allowed ONE visitor in the waiting room during the time you are at the hospital for your procedure. Both you and your visitor must wear a mask once you enter the hospital.       COVID-19 Pre-Screening Questions:  . In the past 14 days have you had a new cough, new headache, new nasal congestion, fever (100.4 or greater) unexplained body aches, new sore throat, or sudden loss of taste or sense of smell? no . In the past 14 days have you been around anyone with known Covid 19? no . Have you been vaccinated for COVID-19? Yes, see immunization history   Reviewed procedure/mask/visitor instructions, COVID-19 questions with patient.              Per Dr Campbell Lerner clopidogrel 300 mg on the evening of 9/23 and we will continue clopidogrel 75 mg daily after the stents are                    placed.  I discussed above instructions for starting clopidogrel with patient.

## 2020-02-21 ENCOUNTER — Other Ambulatory Visit: Payer: Self-pay

## 2020-02-21 ENCOUNTER — Encounter (HOSPITAL_COMMUNITY): Payer: Self-pay | Admitting: Interventional Cardiology

## 2020-02-21 ENCOUNTER — Inpatient Hospital Stay (HOSPITAL_COMMUNITY)
Admission: RE | Admit: 2020-02-21 | Discharge: 2020-02-25 | DRG: 247 | Disposition: A | Discharge status: Home / Self Care | Payer: Medicare Other | Attending: Interventional Cardiology | Admitting: Interventional Cardiology

## 2020-02-21 ENCOUNTER — Encounter (HOSPITAL_COMMUNITY)
Admission: RE | Disposition: A | Discharge status: Home / Self Care | Payer: Self-pay | Source: Home / Self Care | Attending: Interventional Cardiology

## 2020-02-21 DIAGNOSIS — D84821 Immunodeficiency due to drugs: Secondary | ICD-10-CM | POA: Diagnosis present

## 2020-02-21 DIAGNOSIS — I350 Nonrheumatic aortic (valve) stenosis: Secondary | ICD-10-CM | POA: Diagnosis not present

## 2020-02-21 DIAGNOSIS — M069 Rheumatoid arthritis, unspecified: Secondary | ICD-10-CM | POA: Diagnosis present

## 2020-02-21 DIAGNOSIS — E669 Obesity, unspecified: Secondary | ICD-10-CM | POA: Diagnosis not present

## 2020-02-21 DIAGNOSIS — I110 Hypertensive heart disease with heart failure: Secondary | ICD-10-CM | POA: Diagnosis present

## 2020-02-21 DIAGNOSIS — M8580 Other specified disorders of bone density and structure, unspecified site: Secondary | ICD-10-CM | POA: Diagnosis present

## 2020-02-21 DIAGNOSIS — D649 Anemia, unspecified: Secondary | ICD-10-CM | POA: Diagnosis present

## 2020-02-21 DIAGNOSIS — Z7901 Long term (current) use of anticoagulants: Secondary | ICD-10-CM

## 2020-02-21 DIAGNOSIS — M1990 Unspecified osteoarthritis, unspecified site: Secondary | ICD-10-CM | POA: Diagnosis present

## 2020-02-21 DIAGNOSIS — Z9861 Coronary angioplasty status: Secondary | ICD-10-CM

## 2020-02-21 DIAGNOSIS — G8929 Other chronic pain: Secondary | ICD-10-CM | POA: Diagnosis present

## 2020-02-21 DIAGNOSIS — I25119 Atherosclerotic heart disease of native coronary artery with unspecified angina pectoris: Principal | ICD-10-CM | POA: Diagnosis present

## 2020-02-21 DIAGNOSIS — Z79899 Other long term (current) drug therapy: Secondary | ICD-10-CM

## 2020-02-21 DIAGNOSIS — I5032 Chronic diastolic (congestive) heart failure: Secondary | ICD-10-CM | POA: Diagnosis present

## 2020-02-21 DIAGNOSIS — I480 Paroxysmal atrial fibrillation: Secondary | ICD-10-CM | POA: Diagnosis present

## 2020-02-21 DIAGNOSIS — Z90710 Acquired absence of both cervix and uterus: Secondary | ICD-10-CM

## 2020-02-21 DIAGNOSIS — Z7902 Long term (current) use of antithrombotics/antiplatelets: Secondary | ICD-10-CM

## 2020-02-21 DIAGNOSIS — Z794 Long term (current) use of insulin: Secondary | ICD-10-CM

## 2020-02-21 DIAGNOSIS — Z955 Presence of coronary angioplasty implant and graft: Secondary | ICD-10-CM

## 2020-02-21 DIAGNOSIS — I252 Old myocardial infarction: Secondary | ICD-10-CM

## 2020-02-21 DIAGNOSIS — I493 Ventricular premature depolarization: Secondary | ICD-10-CM | POA: Diagnosis present

## 2020-02-21 DIAGNOSIS — Z8261 Family history of arthritis: Secondary | ICD-10-CM

## 2020-02-21 DIAGNOSIS — Z6831 Body mass index (BMI) 31.0-31.9, adult: Secondary | ICD-10-CM | POA: Diagnosis not present

## 2020-02-21 DIAGNOSIS — E119 Type 2 diabetes mellitus without complications: Secondary | ICD-10-CM | POA: Diagnosis present

## 2020-02-21 DIAGNOSIS — D509 Iron deficiency anemia, unspecified: Secondary | ICD-10-CM | POA: Diagnosis present

## 2020-02-21 DIAGNOSIS — I352 Nonrheumatic aortic (valve) stenosis with insufficiency: Secondary | ICD-10-CM | POA: Diagnosis present

## 2020-02-21 DIAGNOSIS — K219 Gastro-esophageal reflux disease without esophagitis: Secondary | ICD-10-CM | POA: Diagnosis present

## 2020-02-21 DIAGNOSIS — Z888 Allergy status to other drugs, medicaments and biological substances status: Secondary | ICD-10-CM

## 2020-02-21 DIAGNOSIS — Z961 Presence of intraocular lens: Secondary | ICD-10-CM | POA: Diagnosis present

## 2020-02-21 DIAGNOSIS — I10 Essential (primary) hypertension: Secondary | ICD-10-CM | POA: Diagnosis present

## 2020-02-21 DIAGNOSIS — Z9842 Cataract extraction status, left eye: Secondary | ICD-10-CM

## 2020-02-21 DIAGNOSIS — E782 Mixed hyperlipidemia: Secondary | ICD-10-CM | POA: Diagnosis present

## 2020-02-21 DIAGNOSIS — Z9841 Cataract extraction status, right eye: Secondary | ICD-10-CM

## 2020-02-21 DIAGNOSIS — I209 Angina pectoris, unspecified: Secondary | ICD-10-CM | POA: Diagnosis present

## 2020-02-21 DIAGNOSIS — I272 Pulmonary hypertension, unspecified: Secondary | ICD-10-CM | POA: Diagnosis present

## 2020-02-21 DIAGNOSIS — Z7952 Long term (current) use of systemic steroids: Secondary | ICD-10-CM

## 2020-02-21 DIAGNOSIS — Z885 Allergy status to narcotic agent status: Secondary | ICD-10-CM

## 2020-02-21 DIAGNOSIS — M549 Dorsalgia, unspecified: Secondary | ICD-10-CM | POA: Diagnosis present

## 2020-02-21 DIAGNOSIS — M4800 Spinal stenosis, site unspecified: Secondary | ICD-10-CM | POA: Diagnosis present

## 2020-02-21 DIAGNOSIS — Z8249 Family history of ischemic heart disease and other diseases of the circulatory system: Secondary | ICD-10-CM

## 2020-02-21 HISTORY — PX: CORONARY STENT INTERVENTION: CATH118234

## 2020-02-21 HISTORY — PX: CORONARY ULTRASOUND/IVUS: CATH118244

## 2020-02-21 HISTORY — DX: Nonrheumatic aortic (valve) stenosis: I35.0

## 2020-02-21 HISTORY — PX: CORONARY ATHERECTOMY: CATH118238

## 2020-02-21 HISTORY — PX: INTRAVASCULAR ULTRASOUND/IVUS: CATH118244

## 2020-02-21 LAB — POCT ACTIVATED CLOTTING TIME
Activated Clotting Time: 241 seconds
Activated Clotting Time: 290 seconds
Activated Clotting Time: 257 seconds
Activated Clotting Time: 307 seconds
Activated Clotting Time: 439 seconds

## 2020-02-21 LAB — GLUCOSE, CAPILLARY
Glucose-Capillary: 143 mg/dL — ABNORMAL HIGH (ref 70–99)
Glucose-Capillary: 154 mg/dL — ABNORMAL HIGH (ref 70–99)
Glucose-Capillary: 195 mg/dL — ABNORMAL HIGH (ref 70–99)
Glucose-Capillary: 140 mg/dL — ABNORMAL HIGH (ref 70–99)

## 2020-02-21 SURGERY — CORONARY STENT INTERVENTION
Anesthesia: LOCAL

## 2020-02-21 MED ORDER — HEPARIN SODIUM (PORCINE) 1000 UNIT/ML IJ SOLN
INTRAMUSCULAR | Status: AC
  Filled 2020-02-21: qty 1

## 2020-02-21 MED ORDER — SODIUM CHLORIDE 0.9 % IV SOLN
INTRAVENOUS | Status: AC | PRN
  Administered 2020-02-21: 250 mL via INTRAVENOUS

## 2020-02-21 MED ORDER — FENTANYL CITRATE (PF) 100 MCG/2ML IJ SOLN
INTRAMUSCULAR | Status: AC
  Filled 2020-02-21: qty 2

## 2020-02-21 MED ORDER — MIDAZOLAM HCL 2 MG/2ML IJ SOLN
INTRAMUSCULAR | Status: DC | PRN
  Administered 2020-02-21 (×3): 1 mg via INTRAVENOUS

## 2020-02-21 MED ORDER — ASPIRIN 81 MG PO CHEW
81.0000 mg | CHEWABLE_TABLET | ORAL | Status: AC
  Administered 2020-02-21: 81 mg via ORAL
  Filled 2020-02-21: qty 1

## 2020-02-21 MED ORDER — SODIUM CHLORIDE 0.9 % IV SOLN: INTRAVENOUS | Status: AC

## 2020-02-21 MED ORDER — IOHEXOL 350 MG/ML SOLN
INTRAVENOUS | Status: DC | PRN
  Administered 2020-02-21: 185 mL

## 2020-02-21 MED ORDER — NITROGLYCERIN 0.4 MG SL SUBL: 0.4000 mg | SUBLINGUAL_TABLET | SUBLINGUAL | Status: DC | PRN

## 2020-02-21 MED ORDER — PREDNISONE 1 MG PO TABS
4.0000 mg | ORAL_TABLET | Freq: Every day | ORAL | Status: DC
  Administered 2020-02-22 – 2020-02-25 (×4): 4 mg via ORAL
  Filled 2020-02-21 (×4): qty 4

## 2020-02-21 MED ORDER — SODIUM CHLORIDE 0.9% FLUSH
3.0000 mL | INTRAVENOUS | Status: DC | PRN
  Administered 2020-02-25: 3 mL via INTRAVENOUS

## 2020-02-21 MED ORDER — SODIUM CHLORIDE 0.9 % WEIGHT BASED INFUSION: 1.0000 mL/kg/h | INTRAVENOUS | Status: DC

## 2020-02-21 MED ORDER — SODIUM CHLORIDE 0.9 % WEIGHT BASED INFUSION
3.0000 mL/kg/h | INTRAVENOUS | Status: DC
  Administered 2020-02-21: 3 mL/kg/h via INTRAVENOUS

## 2020-02-21 MED ORDER — ONDANSETRON HCL 4 MG/2ML IJ SOLN: 4.0000 mg | Freq: Four times a day (QID) | INTRAMUSCULAR | Status: DC | PRN

## 2020-02-21 MED ORDER — INSULIN ASPART 100 UNIT/ML ~~LOC~~ SOLN
6.0000 [IU] | Freq: Three times a day (TID) | SUBCUTANEOUS | Status: DC
  Administered 2020-02-21 – 2020-02-25 (×12): 6 [IU] via SUBCUTANEOUS

## 2020-02-21 MED ORDER — MIDAZOLAM HCL 2 MG/2ML IJ SOLN
INTRAMUSCULAR | Status: AC
  Filled 2020-02-21: qty 2

## 2020-02-21 MED ORDER — SODIUM CHLORIDE 0.9 % IV SOLN: 250.0000 mL | INTRAVENOUS | Status: DC | PRN

## 2020-02-21 MED ORDER — LIDOCAINE HCL (PF) 1 % IJ SOLN
INTRAMUSCULAR | Status: DC | PRN
  Administered 2020-02-21: 2 mL

## 2020-02-21 MED ORDER — VERAPAMIL HCL 2.5 MG/ML IV SOLN
INTRAVENOUS | Status: DC | PRN
  Administered 2020-02-21: 10 mL via INTRA_ARTERIAL

## 2020-02-21 MED ORDER — FENTANYL CITRATE (PF) 100 MCG/2ML IJ SOLN
INTRAMUSCULAR | Status: DC | PRN
Start: 2020-02-21 — End: 2020-02-21
  Administered 2020-02-21 (×3): 25 ug via INTRAVENOUS

## 2020-02-21 MED ORDER — FOLIC ACID 1 MG PO TABS
2.0000 mg | ORAL_TABLET | Freq: Every day | ORAL | Status: DC
  Administered 2020-02-22 – 2020-02-25 (×4): 2 mg via ORAL
  Filled 2020-02-21 (×5): qty 2

## 2020-02-21 MED ORDER — IOHEXOL 350 MG/ML SOLN
INTRAVENOUS | Status: AC
  Filled 2020-02-21: qty 1

## 2020-02-21 MED ORDER — SODIUM CHLORIDE 0.9% FLUSH
3.0000 mL | Freq: Two times a day (BID) | INTRAVENOUS | Status: DC
  Administered 2020-02-21 – 2020-02-24 (×5): 3 mL via INTRAVENOUS

## 2020-02-21 MED ORDER — ATORVASTATIN CALCIUM 40 MG PO TABS
40.0000 mg | ORAL_TABLET | Freq: Every day | ORAL | Status: DC
  Administered 2020-02-21 – 2020-02-24 (×4): 40 mg via ORAL
  Filled 2020-02-21 (×4): qty 1

## 2020-02-21 MED ORDER — LIDOCAINE HCL (PF) 1 % IJ SOLN
INTRAMUSCULAR | Status: AC
  Filled 2020-02-21: qty 30

## 2020-02-21 MED ORDER — NITROGLYCERIN 1 MG/10 ML FOR IR/CATH LAB
INTRA_ARTERIAL | Status: AC
  Filled 2020-02-21: qty 10

## 2020-02-21 MED ORDER — HEPARIN (PORCINE) IN NACL 1000-0.9 UT/500ML-% IV SOLN
INTRAVENOUS | Status: AC
  Filled 2020-02-21: qty 1000

## 2020-02-21 MED ORDER — ACETAMINOPHEN 325 MG PO TABS
650.0000 mg | ORAL_TABLET | ORAL | Status: DC | PRN
  Administered 2020-02-21: 650 mg via ORAL
  Filled 2020-02-21: qty 2

## 2020-02-21 MED ORDER — PANTOPRAZOLE SODIUM 40 MG PO TBEC
40.0000 mg | DELAYED_RELEASE_TABLET | Freq: Every day | ORAL | Status: DC
  Administered 2020-02-21 – 2020-02-24 (×4): 40 mg via ORAL
  Filled 2020-02-21 (×4): qty 1

## 2020-02-21 MED ORDER — HEPARIN (PORCINE) IN NACL 1000-0.9 UT/500ML-% IV SOLN
INTRAVENOUS | Status: DC | PRN
  Administered 2020-02-21 (×2): 500 mL

## 2020-02-21 MED ORDER — NITROGLYCERIN 1 MG/10 ML FOR IR/CATH LAB
INTRA_ARTERIAL | Status: DC | PRN
  Administered 2020-02-21: 200 ug via INTRACORONARY
  Administered 2020-02-21: 200 ug via INTRA_ARTERIAL
  Administered 2020-02-21: 400 ug via INTRA_ARTERIAL

## 2020-02-21 MED ORDER — SODIUM CHLORIDE 0.9% FLUSH
3.0000 mL | Freq: Two times a day (BID) | INTRAVENOUS | Status: DC
  Administered 2020-02-21 – 2020-02-24 (×5): 3 mL via INTRAVENOUS

## 2020-02-21 MED ORDER — HEPARIN SODIUM (PORCINE) 1000 UNIT/ML IJ SOLN
INTRAMUSCULAR | Status: DC | PRN
  Administered 2020-02-21: 3000 [IU] via INTRAVENOUS
  Administered 2020-02-21 (×2): 2000 [IU] via INTRAVENOUS
  Administered 2020-02-21: 8000 [IU] via INTRAVENOUS
  Administered 2020-02-21: 4000 [IU] via INTRAVENOUS

## 2020-02-21 MED ORDER — HYDRALAZINE HCL 20 MG/ML IJ SOLN: 10.0000 mg | INTRAMUSCULAR | Status: AC | PRN

## 2020-02-21 MED ORDER — ESTRADIOL 0.1 MG/GM VA CREA: 1.0000 | TOPICAL_CREAM | Freq: Every day | VAGINAL | Status: DC | PRN

## 2020-02-21 MED ORDER — ESCITALOPRAM OXALATE 10 MG PO TABS
5.0000 mg | ORAL_TABLET | Freq: Every day | ORAL | Status: DC
  Administered 2020-02-22 – 2020-02-25 (×4): 5 mg via ORAL
  Filled 2020-02-21 (×4): qty 1

## 2020-02-21 MED ORDER — CLOPIDOGREL BISULFATE 75 MG PO TABS: 75.0000 mg | ORAL_TABLET | ORAL | Status: DC

## 2020-02-21 MED ORDER — ASPIRIN 81 MG PO CHEW
81.0000 mg | CHEWABLE_TABLET | Freq: Every day | ORAL | Status: DC
  Administered 2020-02-22 – 2020-02-25 (×4): 81 mg via ORAL
  Filled 2020-02-21 (×4): qty 1

## 2020-02-21 MED ORDER — LABETALOL HCL 5 MG/ML IV SOLN: 10.0000 mg | INTRAVENOUS | Status: AC | PRN

## 2020-02-21 MED ORDER — CLOPIDOGREL BISULFATE 75 MG PO TABS
75.0000 mg | ORAL_TABLET | Freq: Every day | ORAL | Status: DC
  Administered 2020-02-22 – 2020-02-25 (×4): 75 mg via ORAL
  Filled 2020-02-21 (×4): qty 1

## 2020-02-21 MED ORDER — METOPROLOL SUCCINATE ER 100 MG PO TB24
100.0000 mg | ORAL_TABLET | Freq: Every day | ORAL | Status: DC
  Administered 2020-02-22 – 2020-02-25 (×4): 100 mg via ORAL
  Filled 2020-02-21 (×4): qty 1

## 2020-02-21 MED ORDER — INSULIN DETEMIR 100 UNIT/ML ~~LOC~~ SOLN
14.0000 [IU] | Freq: Every morning | SUBCUTANEOUS | Status: DC
  Administered 2020-02-22 – 2020-02-25 (×4): 14 [IU] via SUBCUTANEOUS
  Filled 2020-02-21 (×5): qty 0.14

## 2020-02-21 MED ORDER — CLOPIDOGREL BISULFATE 75 MG PO TABS: 75.0000 mg | ORAL_TABLET | Freq: Every day | ORAL | Status: DC

## 2020-02-21 MED ORDER — INSULIN LISPRO 100 UNIT/ML (KWIKPEN): 6.0000 [IU] | PEN_INJECTOR | Freq: Three times a day (TID) | SUBCUTANEOUS | Status: DC

## 2020-02-21 MED ORDER — RAMIPRIL 10 MG PO CAPS
10.0000 mg | ORAL_CAPSULE | Freq: Two times a day (BID) | ORAL | Status: DC
  Administered 2020-02-21 – 2020-02-25 (×8): 10 mg via ORAL
  Filled 2020-02-21 (×9): qty 1

## 2020-02-21 MED ORDER — SODIUM CHLORIDE 0.9% FLUSH: 3.0000 mL | INTRAVENOUS | Status: DC | PRN

## 2020-02-21 SURGICAL SUPPLY — 37 items
BALLN SAPPHIRE 2.0X12 (BALLOONS) ×2
BALLN SAPPHIRE 3.0X15 (BALLOONS) ×2
BALLN SAPPHIRE 3.0X20 (BALLOONS) ×2
BALLN SAPPHIRE ~~LOC~~ 3.25X15 (BALLOONS) ×1 IMPLANT
BALLN SAPPHIRE ~~LOC~~ 3.25X8 (BALLOONS) ×1 IMPLANT
BALLN SAPPHIRE ~~LOC~~ 3.5X15 (BALLOONS) ×1 IMPLANT
BALLN SAPPHIRE ~~LOC~~ 4.0X12 (BALLOONS) ×1 IMPLANT
BALLOON SAPPHIRE 2.0X12 (BALLOONS) IMPLANT
BALLOON SAPPHIRE 3.0X15 (BALLOONS) IMPLANT
BALLOON SAPPHIRE 3.0X20 (BALLOONS) IMPLANT
CATH LAUNCHER 6FR AL1 SH (CATHETERS) IMPLANT
CATH LAUNCHER 6FR EBU 3 (CATHETERS) ×1 IMPLANT
CATH ULTRASOUND OPTICROSS 6FR (CATHETERS) ×1 IMPLANT
CATHETER LAUNCHER 6FR AL1 SH (CATHETERS) ×2
CROWN DIAMONDBACK CLASSIC 1.25 (BURR) ×1 IMPLANT
DEVICE RAD COMP TR BAND LRG (VASCULAR PRODUCTS) ×1 IMPLANT
ELECT DEFIB PAD ADLT CADENCE (PAD) ×1 IMPLANT
GLIDESHEATH SLEND SS 6F .021 (SHEATH) ×1 IMPLANT
GUIDEWIRE INQWIRE 1.5J.035X260 (WIRE) IMPLANT
INQWIRE 1.5J .035X260CM (WIRE) ×2
KIT ENCORE 26 ADVANTAGE (KITS) ×1 IMPLANT
KIT HEART LEFT (KITS) ×2 IMPLANT
KIT HEMO VALVE WATCHDOG (MISCELLANEOUS) ×1 IMPLANT
LUBRICANT VIPERSLIDE CORONARY (MISCELLANEOUS) ×1 IMPLANT
PACK CARDIAC CATHETERIZATION (CUSTOM PROCEDURE TRAY) ×2 IMPLANT
SHEATH PROBE COVER 6X72 (BAG) ×1 IMPLANT
SLED PULL BACK IVUS (MISCELLANEOUS) ×1 IMPLANT
STENT SYNERGY XD 3.0X24 (Permanent Stent) IMPLANT
STENT SYNERGY XD 3.50X16 (Permanent Stent) IMPLANT
SYNERGY XD 3.0X24 (Permanent Stent) ×2 IMPLANT
SYNERGY XD 3.50X16 (Permanent Stent) ×2 IMPLANT
TRANSDUCER W/STOPCOCK (MISCELLANEOUS) ×2 IMPLANT
TUBING CIL FLEX 10 FLL-RA (TUBING) ×2 IMPLANT
WIRE ASAHI GRAND SLAM 180CM (WIRE) ×1 IMPLANT
WIRE ASAHI PROWATER 180CM (WIRE) ×1 IMPLANT
WIRE HI TORQ BMW 190CM (WIRE) ×1 IMPLANT
WIRE VIPERWIRE COR FLEX .012 (WIRE) ×1 IMPLANT

## 2020-02-21 NOTE — Consult Note (Addendum)
[facility] VALVE TEAM  Inpatient TAVR Consultation:   Patient ID: Sophia Ward; [id number]; 1933-01-08   Admit date: 02/21/2020 Date of Consult: 02/21/2020  Primary Care Provider: [name], MD Primary Cardiologist: Dr. Irish Lack   Patient Profile:   Sophia Ward is a 86 y.o. female with a hx of rheumatoid arthritis on long term MTX and prednisone, obesity, CAD s/p acute MI with PCI to LAD (2004), HTN, IDDM, HLD, PAF on Eliquis and spinal stenosis with chronic back pain who is being seen today for the evaluation of severe AS at the request of Dr. Irish Lack.  History of Present Illness:   Sophia Ward is married and lives in [place]. She is mostly limited by chronic arthritis and back pain as well as shortness of breath. She lives a sedentary lifestyle and is not very active. Of note, she has regular dental check ups with no ongoing dental issues.  The patient's cardiac history dates back to 2004 when she presented with an acute MI.  She was treated with PCI and stenting of the LAD.  The patient had early in-stent restenosis and underwent repeat PCI and stenting around 5 months later.  She has been followed by Dr. Irish Lack and noted to have at least moderate aortic stenosis by echo in 2019: EF 60%, mean gradient of 22 mm Hg.   She was admitted in May 2021 with acute blood loss anemia with a hemoglobin down to 7.9.  She underwent EGD and colonoscopy which were unremarkable except for a few ulcers in the terminal ileum.  During this admission she was also noted to go into paroxysmal atrial fibrillation.   This was a new diagnosis for her.  She was started on Eliquis and Plavix was discontinued.  2D echo during this admission revealed normal LV function with progression of her aortic stenosis to severe: mean gradient 32 mmHg, peak gradient 56.9 mm hg, AVA 0.80 cm2, DVI 0.24, SVI 31. Plans were made to proceed with [facility].   She was  then readmitted in 12/2019 for UTI and acute hypoxic respiratory failure due to left lower lobe pneumonia and acute decompensated diastolic heart failure. Covid test was negative.  The patient eventually underwent left and right heart catheterization on 01/29/2020 with Dr. Beau Fanny.  This revealed severe proximal two-vessel CAD with ostial stenosis of the RCA and 70% proximal stenosis of the LAD prior to widely patent previously placed stents.  There was nonobstructive disease in the left circumflex. Mean transvalvular gradient across the aortic valve was measured 16.8 mmHg on pullback corresponding to aortic valve area calculated 1.59 cm.  Pulmonary artery pressures were moderately elevated. No intervention was performed at that time and she was referred to Dr. Roxy Manns for cardiothoracic surgery consultation. She underwent gardiac gated CTA of the heart which revealed anatomical characteristics consistent with aortic stenosis suitable for treatment by transcatheter aortic valve replacement without any significant complicating features, although the size of the patient's aortic root and aortic annulus was relatively small and there was significant calcification noted in the sinotubular junction and the aortic root.  Gated CTA of the aorta and iliac vessels demonstrated what appear to be adequate pelvic vascular access to facilitate a transfemoral approach.     She was seen by Dr. Roxy Manns in the office on 02/13/20. Given patients advanced age and multiple comorbidities, it was decided to proceed with staged PCI and stenting followed by TAVR as a less than base of approach to  her treatment.   Today she presented to [facility] for planned coronary revascularization today with Dr. Irish Lack. She underwent successful successful PCI of the ostial RCA, followed by oribital atherectomy and PCI of the left main/LAD. Struts to circumflex opened with small balloon as well since the circumflex was jailed. She was continued on DAPT with  aspirin and Plavix and Eliquis held.   Today she is seen in the post cath holding area. She is doing well with no complaints. She denies and current chest pain or sob. She did have a couple of episodes of left sided pressure prior to admission. Not necessarily related to exertion. She has chronic dyspnea on exertion. She has some mild intermittent, LE edema, but no orthopnea or PND. No dizziness or syncope. No blood in stool or urine. No palpitations.     Past Medical History:  Diagnosis Date  . Allergic rhinitis   . Anemia 09/2019  . Arthritis    hands  . BPPV (benign paroxysmal positional vertigo)   . Coronary artery disease   . Coronary atherosclerosis of native coronary artery 01/30/2014  . Diabetes mellitus without complication ([place])   . Essential hypertension, benign 01/30/2014  . GERD (gastroesophageal reflux disease)   . Hypertension   . Mixed hyperlipidemia 01/30/2014  . Myocardial infarction ([place]) 2004   mild MI-no damage- had stents  . Osteopenia   . Post-menopausal   . Rheumatoid arthritis ([place])   . Spinal stenosis   . Stenosing tenosynovitis     Past Surgical History:  Procedure Laterality Date  . ABDOMINAL HYSTERECTOMY    . APPENDECTOMY    . BIOPSY  10/25/2019   Procedure: BIOPSY;  Surgeon: Otis Brace, MD;  Location: [facility];  Service: Gastroenterology;;  . BIOPSY  10/26/2019   Procedure: BIOPSY;  Surgeon: Otis Brace, MD;  Location: [facility];  Service: Gastroenterology;;  . CARDIAC CATHETERIZATION  2004  . carpel tunnel    . COLONOSCOPY WITH PROPOFOL N/A 10/30/2012   Procedure: COLONOSCOPY WITH PROPOFOL;  Surgeon: Garlan Fair, MD;  Location: [facility];  Service: Endoscopy;  Laterality: N/A;  . COLONOSCOPY WITH PROPOFOL N/A 10/26/2019   Procedure: COLONOSCOPY WITH PROPOFOL;  Surgeon: Otis Brace, MD;  Location: [facility];  Service: Gastroenterology;  Laterality: N/A;  . CORONARY ANGIOPLASTY  2004  . DILATION AND CURETTAGE OF UTERUS     . ESOPHAGOGASTRODUODENOSCOPY N/A 10/25/2019   Procedure: ESOPHAGOGASTRODUODENOSCOPY (EGD);  Surgeon: Otis Brace, MD;  Location: [facility];  Service: Gastroenterology;  Laterality: N/A;  . ESOPHAGOGASTRODUODENOSCOPY (EGD) WITH PROPOFOL N/A 10/30/2012   Procedure: ESOPHAGOGASTRODUODENOSCOPY (EGD) WITH PROPOFOL;  Surgeon: Garlan Fair, MD;  Location: [facility];  Service: Endoscopy;  Laterality: N/A;  . EYE SURGERY     bilateral cataract with lens implant  . EYE SURGERY    . INJECTION OF SILICONE OIL Left 1/61/0960   Procedure: Injection Of Silicone Oil;  Surgeon: Jalene Mullet, MD;  Location: [facility];  Service: Ophthalmology;  Laterality: Left;  . INTRAVASCULAR PRESSURE WIRE/FFR STUDY N/A 01/29/2020   Procedure: INTRAVASCULAR PRESSURE WIRE/FFR STUDY;  Surgeon: Jettie Booze, MD;  Location: [facility] CV [facility];  Service: Cardiovascular;  Laterality: N/A;  . left shouler surgery    . PHOTOCOAGULATION WITH LASER Left 02/12/2019   Procedure: Photocoagulation With Laser;  Surgeon: Jalene Mullet, MD;  Location: [facility];  Service: Ophthalmology;  Laterality: Left;  . 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 02/12/2019   Procedure: REPAIR OF COMPLEX TRACTION RETINAL DETACHMENT;  Surgeon: Jalene Mullet, MD;  Location: [facility];  Service: Ophthalmology;  Laterality: Left;  . RIGHT/LEFT HEART CATH AND CORONARY ANGIOGRAPHY N/A 01/29/2020   Procedure: RIGHT/LEFT HEART CATH AND CORONARY ANGIOGRAPHY;  Surgeon: Jettie Booze, MD;  Location: [place] CV LAB;  Service: Cardiovascular;  Laterality: N/A;  . TONSILLECTOMY    . VITRECTOMY 25 GAUGE WITH SCLERAL BUCKLE Left 02/12/2019   Procedure: Vitrectomy 25 Gauge With Scleral Buckle;  Surgeon: Jalene Mullet, MD;  Location: [facility];  Service: Ophthalmology;  Laterality: Left;     Inpatient Medications: Scheduled Meds: . clopidogrel  75 mg Oral Pre-Cath  . sodium chloride flush  3 mL Intravenous Q12H   Continuous Infusions: .  sodium chloride    . sodium chloride 1 mL/kg/hr (02/21/20 0828)   PRN Meds: sodium chloride, fentaNYL, Heparin (Porcine) in NaCl, heparin sodium (porcine), lidocaine (PF), midazolam, Radial Cocktail/Verapamil only, sodium chloride flush  Allergies:    Allergies  Allergen Reactions  . Codeine Nausea And Vomiting    MAKES HER SICK  . Glimepiride Other (See Comments)    Can't remember  . Jardiance [Empagliflozin] Other (See Comments)    Yeast  . Metformin And Related Diarrhea  . Penicillins Rash    Social History:   Social History   Socioeconomic History  . Marital status: Married    Spouse name: Not on file  . Number of children: Not on file  . Years of education: Not on file  . Highest education level: Not on file  Occupational History  . Not on file  Tobacco Use  . Smoking status: Never Smoker  . Smokeless tobacco: Never Used  Vaping Use  . Vaping Use: Never used  Substance and Sexual Activity  . Alcohol use: No  . Drug use: No  . Sexual activity: Not on file  Other Topics Concern  . Not on file  Social History Narrative  . Not on file   Social Determinants of Health   Financial Resource Strain:   . Difficulty of Paying Living Expenses: Not on file  Food Insecurity:   . Worried About Charity fundraiser in the Last Year: Not on file  . Ran Out of Food in the Last Year: Not on file  Transportation Needs:   . Lack of Transportation (Medical): Not on file  . Lack of Transportation (Non-Medical): Not on file  Physical Activity:   . Days of Exercise per Week: Not on file  . Minutes of Exercise per Session: Not on file  Stress:   . Feeling of Stress : Not on file  Social Connections:   . Frequency of Communication with Friends and Family: Not on file  . Frequency of Social Gatherings with Friends and Family: Not on file  . Attends Religious Services: Not on file  . Active Member of Clubs or Organizations: Not on file  . Attends Archivist Meetings:  Not on file  . Marital Status: Not on file  Intimate Partner Violence:   . Fear of Current or Ex-Partner: Not on file  . Emotionally Abused: Not on file  . Physically Abused: Not on file  . Sexually Abused: Not on file    Family History:   The patient's family history includes Arthritis in her daughter; CAD in her father and mother; Heart Problems in her brother; Heart attack in her mother.  ROS:  Please see the history of present illness.  ROS  All other ROS reviewed and negative.     Physical Exam/Data:   Vitals:  02/21/20 0706 02/21/20 0821  BP: 140/61   Pulse: 77   Resp: 16   Temp: 97.7 F (36.5 C)   TempSrc: Oral   SpO2: 99% 94%  Weight: 77.1 kg   Height: 5' 2.5" (1.588 m)    No intake or output data in the 24 hours ending 02/21/20 0851 Filed Weights   02/21/20 0706  Weight: 77.1 kg   Body mass index is 30.6 kg/m.  General:  Well nourished, well developed, in no acute distress,laying flat after cath HEENT: normal Lymph: no adenopathy Neck: no JVD Endocrine:  No thryoid Cardiac:  normal S1, S2; RRR; 3/6 SEM Lungs:  clear to auscultation bilaterally, no wheezing, rhonchi or rales  Abd: soft, nontender, no hepatomegaly  Ext: no edema Musculoskeletal:  No deformities, BUE and BLE strength normal and equal Skin: warm and dry  Neuro:  CNs 2-12 intact, no focal abnormalities noted Psych:  Normal affect   EKG:  The EKG was personally reviewed and demonstrates: Sinus with anterolateral TWIs Tele: sinus with frequent PVCs  Relevant CV Studies: Echo 10/24/19 IMPRESSIONS  1. Left ventricular ejection fraction, by estimation, is 60 to 65%. The  left ventricle has normal function. The left ventricle has no regional  wall motion abnormalities. Left ventricular diastolic function could not  be evaluated. Elevated left  ventricular end-diastolic pressure.  2. Right ventricular systolic function is normal. The right ventricular  size is normal. There is moderately  elevated pulmonary artery systolic  pressure.  3. Left atrial size was mildly dilated.  4. The mitral valve is abnormal. Trivial mitral valve regurgitation.  5. The aortic valve is tricuspid. Aortic valve regurgitation is trivial.  Severe aortic valve stenosis. Aortic valve area, by VTI measures 0.76 cm.  Aortic valve mean gradient measures 32.0 mmHg. Aortic valve Vmax measures  3.77 m/s.  6. The inferior vena cava is dilated in size with <50% respiratory  variability, suggesting right atrial pressure of 15 mmHg.   Comparison(s): Changes from prior study are noted. 02/02/2018: LVEF 60-65%,  moderate AS - mean gradient 22 mmHg.   FINDINGS  Left Ventricle: Left ventricular ejection fraction, by estimation, is 60  to 65%. The left ventricle has normal function. The left ventricle has no  regional wall motion abnormalities. The left ventricular internal cavity  size was normal in size. There is  no left ventricular hypertrophy. Left ventricular diastolic function  could not be evaluated due to indeterminate diastolic function. Left  ventricular diastolic function could not be evaluated. Elevated left  ventricular end-diastolic pressure.   Right Ventricle: The right ventricular size is normal. No increase in  right ventricular wall thickness. Right ventricular systolic function is  normal. There is moderately elevated pulmonary artery systolic pressure.  The tricuspid regurgitant velocity is  2.80 m/s, and with an assumed right atrial pressure of 15 mmHg, the  estimated right ventricular systolic pressure is 45.8 mmHg.   Left Atrium: Left atrial size was mildly dilated.   Right Atrium: Right atrial size was normal in size.   Pericardium: There is no evidence of pericardial effusion.   Mitral Valve: The mitral valve is abnormal. There is mild calcification of  the posterior mitral valve leaflet(s). Mild to moderate mitral annular  calcification. Trivial mitral valve  regurgitation.   Tricuspid Valve: The tricuspid valve is grossly normal. Tricuspid valve  regurgitation is mild.   Aortic Valve: The aortic valve is tricuspid. Aortic valve regurgitation is  trivial. Severe aortic stenosis is present. Aortic valve mean  gradient  measures 32.0 mmHg. Aortic valve peak gradient measures 56.9 mmHg. Aortic  valve area, by VTI measures 0.76  cm.   Pulmonic Valve: The pulmonic valve was normal in structure. Pulmonic valve  regurgitation is trivial.   Aorta: The aortic root and ascending aorta are structurally normal, with  no evidence of dilitation.   Venous: The inferior vena cava is dilated in size with less than 50%  respiratory variability, suggesting right atrial pressure of 15 mmHg.   IAS/Shunts: No atrial level shunt detected by color flow Doppler.     LEFT VENTRICLE  PLAX 2D  LVIDd:     4.50 cm Diastology  LVIDs:     3.20 cm LV e' lateral:  4.62 cm/s  LV PW:     1.00 cm LV E/e' lateral: 27.2  LV IVS:    1.00 cm LV e' medial:  4.70 cm/s  LVOT diam:   2.00 cm LV E/e' medial: 26.7  LV SV:     54  LV SV Index:  31  LVOT Area:   3.14 cm     RIGHT VENTRICLE  RV S prime:   8.51 cm/s  TAPSE (M-mode): 1.4 cm   LEFT ATRIUM       Index    RIGHT ATRIUM      Index  LA diam:    4.00 cm 2.26 cm/m RA Area:   16.10 cm  LA Vol (A2C):  44.4 ml 25.10 ml/m RA Volume:  41.80 ml 23.63 ml/m  LA Vol (A4C):  63.4 ml 35.84 ml/m  LA Biplane Vol: 54.5 ml 30.81 ml/m  AORTIC VALVE  AV Area (Vmax):  0.80 cm  AV Area (Vmean):  0.70 cm  AV Area (VTI):   0.76 cm  AV Vmax:      377.00 cm/s  AV Vmean:     272.000 cm/s  AV VTI:      0.713 m  AV Peak Grad:   56.9 mmHg  AV Mean Grad:   32.0 mmHg  LVOT Vmax:     95.80 cm/s  LVOT Vmean:    60.800 cm/s  LVOT VTI:     0.172 m  LVOT/AV VTI ratio: 0.24    AORTA  Ao Root diam: 2.80 cm   MITRAL VALVE         TRICUSPID VALVE  MV Area (PHT): 3.90 cm   TR Peak grad:  31.4 mmHg  MV Decel Time: 195 msec   TR Vmax:    280.00 cm/s  MV E velocity: 125.50 cm/s  MV A velocity: 62.25 cm/s  SHUNTS  MV E/A ratio: 2.02     Systemic VTI: 0.17 m               Systemic Diam: 2.00 cm  _____________________   Cath 01/29/20 INTRAVASCULAR PRESSURE WIRE/FFR STUDY  RIGHT/LEFT HEART CATH AND CORONARY ANGIOGRAPHY  Conclusion    Ost LAD to Prox LAD lesion is 70% stenosed. Significant by DFR, 0.74.  Distal area of Previously placed Mid LAD stent (unknown type) is widely patent.  Dist LM to Ost LAD lesion is 25% stenosed.  Ost RCA to Prox RCA lesion is 75% stenosed.  LV end diastolic pressure is mildly elevated.  Hemodynamic findings consistent with moderate pulmonary hypertension.  Ao 90%, PA sat 62%, CO 6.9 L/min; CI 3.88; PA pressure 58/16, mean PA pressure 37 mm Hg; mean PCWP 22- significant V waves noted on wedge tracing   Severe proximal, two vessel disease.  Ostial RCA is  favorable for PCI.  Proximal LAD would be more technically challenging.   Restrictive physiology likely given V waves.   Plan for surgical consult to decide about CABG/AVR vs PCI/TAVR.  Case d/w Dr. Burt Knack as well.   Recommendations   ________________   02/21/20 CORONARY STENT INTERVENTION  CORONARY ATHERECTOMY  Intravascular Ultrasound/IVUS  Conclusion  Ost RCA to Prox RCA lesion is 75% stenosed.  A drug-eluting stent was successfully placed using a SYNERGY XD 3.50X16, postdilated to > 4 mm.  Post intervention, there is a 0% residual stenosis.  Dist LM to Ost LAD lesion is 25% stenosed. Ost LAD to Prox LAD lesion is 75% stenosed.  After orbital atherectomy, a drug-eluting stent was successfully placed using a SYNERGY XD 3.0X24, postdilated to 3.4 in the mid vessel and > 4 mm in the proximal/ostial LAD and left main. Stent optimized with IVUS.  Post intervention, there  is a 0% residual stenosis.  Ost Cx to Prox Cx lesion is 75% stenosed, after being jailed by the stent.  Balloon angioplasty was performed using a BALLOON SAPPHIRE 2.0X12.  Post intervention, there is a 10% residual stenosis.   Successful PCI of the ostial RCA, followed by the left main/LADContinue dual antiplatelet therapy.  Struts to circumflex opened with small balloon as well since the circumflex was jailed. Continue DAPT.  Will hold Eliquis for now.      Laboratory Data:  Chemistry Recent Labs  Lab 02/19/20 0833  NA 140  K 4.1  CL 105  CO2 23  GLUCOSE 132*  BUN 17  CREATININE 1.13*  CALCIUM 9.5  GFRNONAA 45*  GFRAA 52*    No results for input(s): PROT, ALBUMIN, AST, ALT, ALKPHOS, BILITOT in the last 168 hours. Hematology Recent Labs  Lab 02/19/20 0833  WBC 11.8*  RBC 3.11*  HGB 8.3*  HCT 27.1*  MCV 87  MCH 26.7  MCHC 30.6*  RDW 17.9*  PLT 405   Cardiac EnzymesNo results for input(s): TROPONINI in the last 168 hours. No results for input(s): TROPIPOC in the last 168 hours.  BNPNo results for input(s): BNP, PROBNP in the last 168 hours.  DDimer No results for input(s): DDIMER in the last 168 hours.  Radiology/Studies:  No results found.   STS Risk Calculator: STS score Procedure: AVR + CAB Risk of Mortality: 16.749% Renal Failure: 13.400% Permanent Stroke: 2.504% Prolonged Ventilation: 30.284% DSW Infection: 3.990% Reoperation: 5.915% Morbidity or Mortality: 43.043% Short Length of Stay: 2.668% Long Length of Stay: 50.942%  ______________________  [facility] Cardiomyopathy Questionnaire  KCCQ-12 02/13/2020  1 a. Ability to shower/bathe Not at all limited  1 b. Ability to walk 1 block Quite a bit limited  1 c. Ability to hurry/jog Extremely limited  2. Edema feet/ankles/legs Less than once a week  3. Limited by fatigue Less than once a week  4. Limited by dyspnea 1-2 times a week  5. Sitting up / on 3+ pillows Never over the past  2 weeks  6. Limited enjoyment of life Moderately limited  7. Rest of life w/ symptoms Not at all satisfied  8 a. Participation in hobbies Limited quite a bit  8 b. Participation in chores Severely limited  8 c. Visiting family/friends Limited quite a bit       Assessment and Plan:   Sophia Ward is a 86 y.o. female with symptoms of severe, stage D3 aortic stenosis with NYHA Class III symptoms. I have reviewed the patient's recent echocardiogram which is notable for preserved  LV systolic function and severe aortic stenosis with peak gradient of 56.9 mm hg and mean transvalvular gradient of 32 mm hg. The patient's dimensionless index is 0.24 and calculated aortic valve area is 0.81 cm. SVI 31.   L/RHC on 01/29/2020 revealed severe proximal two-vessel CAD with ostial stenosis of the RCA and 70% proximal stenosis of the LAD prior to widely patent previously placed stents.  There was nonobstructive disease in the left circumflex. Mean transvalvular gradient across the aortic valve was measured 16.8 mmHg on pullback corresponding to aortic valve area calculated 1.59 cm.  Pulmonary artery pressures were moderately elevated.   She underwent gardiac gated CTA of the heart which revealed anatomical characteristics consistent with aortic stenosis suitable for treatment by transcatheter aortic valve replacement without any significant complicating features, although the size of the patient's aortic root and aortic annulus was relatively small and there was significant calcification noted in the sinotubular junction and the aortic root.  Gated CTA of the aorta and iliac vessels demonstrated what appear to be adequate pelvic vascular access to facilitate a transfemoral approach.     Today, she underwent successful successful PCI of the ostial RCA, followed by oribital atherectomy and PCI of the left main/LAD. Struts to circumflex opened with small balloon as well since the circumflex was jailed. She was  continued on DAPT with aspirin and Plavix and Eliquis held.    I have reviewed the natural history of aortic stenosis with the patient. We have discussed the limitations of medical therapy and the poor prognosis associated with symptomatic aortic stenosis. We have reviewed potential treatment options, including palliative medical therapy, conventional surgical aortic valve replacement, and transcatheter aortic valve replacement. We discussed treatment options in the context of this patient's specific comorbid medical conditions.    The patient's predicted risk of mortality with conventional aortic valve replacement is 16.749% primarily based on age, cad, chf, htn, obesity and anemia. Other significant comorbid conditions include physical deconditioning with limited mobility. She has already been evaluated by Dr. Roxy Manns and felt to be too high risk for CAB + SAVR so we elected to proceed with PCI + TAVR. PCI was completed today and she is tentatively scheduled for TAVR on 03/03/20.   Dr. Burt Knack to follow.    Signed, Angelena Form, PA-C  02/21/2020 8:51 AM   Patient seen, examined. Available data reviewed. Agree with findings, assessment, and plan as outlined by Nell Range, PA-C.  The patient is independently interviewed and examined.  She is an elderly woman in no distress.  Carotid upstrokes are brisk with bilateral bruits.  Jugular venous pressure is normal.  Lungs are clear to auscultation bilaterally, heart is regular rate and rhythm with a grade 3/6 late peaking harsh crescendo decrescendo murmur best heard at the apex.  Abdomen is soft and nontender with no masses.  Extremities have no edema.  Skin is warm and dry without rash.  I have personally reviewed all of the patient's preoperative imaging studies.  Her echocardiogram shows preserved LV systolic function with an LVEF of 65%.  The aortic valve is severely calcified and restricted.  The noncoronary cusp is completely immobile and there is  limited mobility of the left and right cusps.  Peak and mean transvalvular gradients are 57 and 32 mmHg, respectively and the dimensionless index is 0.24. Calculated AVA is 0.7 square cm. Cardiac cath films are reviewed, as are today's PCI films involving PCI of the RCA and left main/LAD.   She clearly has progressive  symptoms of worsening, chronic diastolic heart failure now with NYHA functional class III limitation. I think she has severe, Stage D3, aortic stenosis (paradoxical low flow low gradient).  She has significant comorbid medical conditions including rheumatoid arthritis on chronic immunosuppressants, chronic anemia, advanced age, and physical deconditioning.  TAVR is a reasonable treatment option for this patient, associated with a lower morbidity and conventional surgery.  She has already undergone formal cardiac surgical consultation and is felt to be a poor candidate.  CT angiogram studies demonstrate adequate pelvic access for transfemoral TAVR.  We will have to determine the best timing in light of her current hemoglobin of 8.3.  She has followed in hematology clinic.  I suspect after undergoing a complex interventional procedure today, that her hemoglobin will drop further.  It may be in her best interest to work on improving her hemoglobin/hematocrit prior to TAVR.  This is discussed with the patient and her husband who is at the bedside.  All questions are answered.  We will follow up shortly after discharge to determine timing of TAVR.  Risks, indications, and alternatives of transfemoral TAVR have been reviewed with the patient and her husband.  They understand and are eager to proceed when appropriate.  Sherren Mocha, M.D. 02/21/2020 5:25 PM

## 2020-02-21 NOTE — H&P (View-Only) (Signed)
[place] VALVE TEAM  Inpatient TAVR Consultation:   Patient ID: Sophia Ward; [id number]; Apr 11, 1933   Admit date: 02/21/2020 Date of Consult: 02/21/2020  Primary Care Provider: [name], MD Primary Cardiologist: Dr. Irish Lack   Patient Profile:   Sophia Ward is a 86 y.o. female with a hx of rheumatoid arthritis on long term MTX and prednisone, obesity, CAD s/p acute MI with PCI to LAD (2004), HTN, IDDM, HLD, PAF on Eliquis and spinal stenosis with chronic back pain who is being seen today for the evaluation of severe AS at the request of Dr. Irish Lack.  History of Present Illness:   Sophia Ward is married and lives in [place]. She is mostly limited by chronic arthritis and back pain as well as shortness of breath. She lives a sedentary lifestyle and is not very active. Of note, she has regular dental check ups with no ongoing dental issues.  The patient's cardiac history dates back to 2004 when she presented with an acute MI.  She was treated with PCI and stenting of the LAD.  The patient had early in-stent restenosis and underwent repeat PCI and stenting around 5 months later.  She has been followed by Dr. Irish Lack and noted to have at least moderate aortic stenosis by echo in 2019: EF 60%, mean gradient of 22 mm Hg.   She was admitted in May 2021 with acute blood loss anemia with a hemoglobin down to 7.9.  She underwent EGD and colonoscopy which were unremarkable except for a few ulcers in the terminal ileum.  During this admission she was also noted to go into paroxysmal atrial fibrillation.   This was a new diagnosis for her.  She was started on Eliquis and Plavix was discontinued.  2D echo during this admission revealed normal LV function with progression of her aortic stenosis to severe: mean gradient 32 mmHg, peak gradient 56.9 mm hg, AVA 0.80 cm2, DVI 0.24, SVI 31. Plans were made to proceed with [facility].   She was  then readmitted in 12/2019 for UTI and acute hypoxic respiratory failure due to left lower lobe pneumonia and acute decompensated diastolic heart failure. Covid test was negative.  The patient eventually underwent left and right heart catheterization on 01/29/2020 with Dr. Beau Fanny.  This revealed severe proximal two-vessel CAD with ostial stenosis of the RCA and 70% proximal stenosis of the LAD prior to widely patent previously placed stents.  There was nonobstructive disease in the left circumflex. Mean transvalvular gradient across the aortic valve was measured 16.8 mmHg on pullback corresponding to aortic valve area calculated 1.59 cm.  Pulmonary artery pressures were moderately elevated. No intervention was performed at that time and she was referred to Dr. Roxy Manns for cardiothoracic surgery consultation. She underwent gardiac gated CTA of the heart which revealed anatomical characteristics consistent with aortic stenosis suitable for treatment by transcatheter aortic valve replacement without any significant complicating features, although the size of the patient's aortic root and aortic annulus was relatively small and there was significant calcification noted in the sinotubular junction and the aortic root.  Gated CTA of the aorta and iliac vessels demonstrated what appear to be adequate pelvic vascular access to facilitate a transfemoral approach.     She was seen by Dr. Roxy Manns in the office on 02/13/20. Given patients advanced age and multiple comorbidities, it was decided to proceed with staged PCI and stenting followed by TAVR as a less than base of approach to  her treatment.   [facility] today with Dr. Irish Lack. She underwent successful successful PCI of the ostial RCA, followed by oribital atherectomy and PCI of the left main/LAD. Struts to circumflex opened with small balloon as well since the circumflex was jailed. She was continued on DAPT with  aspirin and Plavix and Eliquis held.   Today she is seen in the post cath holding area. She is doing well with no complaints. She denies and current chest pain or sob. She did have a couple of episodes of left sided pressure prior to admission. Not necessarily related to exertion. She has chronic dyspnea on exertion. She has some mild intermittent, LE edema, but no orthopnea or PND. No dizziness or syncope. No blood in stool or urine. No palpitations.     Past Medical History:  Diagnosis Date  . Allergic rhinitis   . Anemia 09/2019  . Arthritis    hands  . BPPV (benign paroxysmal positional vertigo)   . Coronary artery disease   . Coronary atherosclerosis of native coronary artery 01/30/2014  . Diabetes mellitus without complication ([place])   . Essential hypertension, benign 01/30/2014  . GERD (gastroesophageal reflux disease)   . Hypertension   . Mixed hyperlipidemia 01/30/2014  . Myocardial infarction ([place]) 2004   mild MI-no damage- had stents  . Osteopenia   . Post-menopausal   . Rheumatoid arthritis ([place])   . Spinal stenosis   . Stenosing tenosynovitis     Past Surgical History:  Procedure Laterality Date  . ABDOMINAL HYSTERECTOMY    . APPENDECTOMY    . BIOPSY  10/25/2019   Procedure: BIOPSY;  Surgeon: Otis Brace, MD;  Location: [place];  Service: Gastroenterology;;  . BIOPSY  10/26/2019   Procedure: BIOPSY;  Surgeon: Otis Brace, MD;  Location: [facility];  Service: Gastroenterology;;  . CARDIAC CATHETERIZATION  2004  . carpel tunnel    . COLONOSCOPY WITH PROPOFOL N/A 10/30/2012   Procedure: COLONOSCOPY WITH PROPOFOL;  Surgeon: Garlan Fair, MD;  Location: [facility];  Service: Endoscopy;  Laterality: N/A;  . COLONOSCOPY WITH PROPOFOL N/A 10/26/2019   Procedure: COLONOSCOPY WITH PROPOFOL;  Surgeon: Otis Brace, MD;  Location: [facility];  Service: Gastroenterology;  Laterality: N/A;  . CORONARY ANGIOPLASTY  2004  . DILATION AND CURETTAGE OF UTERUS     . ESOPHAGOGASTRODUODENOSCOPY N/A 10/25/2019   Procedure: ESOPHAGOGASTRODUODENOSCOPY (EGD);  Surgeon: Otis Brace, MD;  Location[facility];  Service: Gastroenterology;  Laterality: N/A;  . ESOPHAGOGASTRODUODENOSCOPY (EGD) WITH PROPOFOL N/A 10/30/2012   Procedure: ESOPHAGOGASTRODUODENOSCOPY (EGD) WITH PROPOFOL;  Surgeon: Garlan Fair, MD;  Location: [facility];  Service: Endoscopy;  Laterality: N/A;  . EYE SURGERY     bilateral cataract with lens implant  . EYE SURGERY    . INJECTION OF SILICONE OIL Left 01/14/5630   Procedure: Injection Of Silicone Oil;  Surgeon: Jalene Mullet, MD;  Location: [facility];  Service: Ophthalmology;  Laterality: Left;  . INTRAVASCULAR PRESSURE WIRE/FFR STUDY N/A 01/29/2020   Procedure: INTRAVASCULAR PRESSURE WIRE/FFR STUDY;  Surgeon: Jettie Booze, MD;  Location: [facility] CV LAB;  Service: Cardiovascular;  Laterality: N/A;  . left shouler surgery    . PHOTOCOAGULATION WITH LASER Left 02/12/2019   Procedure: Photocoagulation With Laser;  Surgeon: Jalene Mullet, MD;  Location: [facility];  Service: Ophthalmology;  Laterality: Left;  . 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 02/12/2019   Procedure: REPAIR OF COMPLEX TRACTION RETINAL DETACHMENT;  Surgeon: Jalene Mullet, MD;  Location: [facility];  Service: Ophthalmology;  Laterality: Left;  . RIGHT/LEFT HEART CATH AND CORONARY ANGIOGRAPHY N/A 01/29/2020   Procedure: RIGHT/LEFT HEART CATH AND CORONARY ANGIOGRAPHY;  Surgeon: Jettie Booze, MD;  Location: [facility];  Service: Cardiovascular;  Laterality: N/A;  . TONSILLECTOMY    . VITRECTOMY 25 GAUGE WITH SCLERAL BUCKLE Left 02/12/2019   Procedure: Vitrectomy 25 Gauge With Scleral Buckle;  Surgeon: Jalene Mullet, MD;  Location: ;  Service: Ophthalmology;  Laterality: Left;     Inpatient Medications: Scheduled Meds: . clopidogrel  75 mg Oral Pre-Cath  . sodium chloride flush  3 mL Intravenous Q12H   Continuous Infusions: .  sodium chloride    . sodium chloride 1 mL/kg/hr (02/21/20 0828)   PRN Meds: sodium chloride, fentaNYL, Heparin (Porcine) in NaCl, heparin sodium (porcine), lidocaine (PF), midazolam, Radial Cocktail/Verapamil only, sodium chloride flush  Allergies:    Allergies  Allergen Reactions  . Codeine Nausea And Vomiting    MAKES HER SICK  . Glimepiride Other (See Comments)    Can't remember  . Jardiance [Empagliflozin] Other (See Comments)    Yeast  . Metformin And Related Diarrhea  . Penicillins Rash    Social History:   Social History   Socioeconomic History  . Marital status: Married    Spouse name: Not on file  . Number of children: Not on file  . Years of education: Not on file  . Highest education level: Not on file  Occupational History  . Not on file  Tobacco Use  . Smoking status: Never Smoker  . Smokeless tobacco: Never Used  Vaping Use  . Vaping Use: Never used  Substance and Sexual Activity  . Alcohol use: No  . Drug use: No  . Sexual activity: Not on file  Other Topics Concern  . Not on file  Social History Narrative  . Not on file   Social Determinants of Health   Financial Resource Strain:   . Difficulty of Paying Living Expenses: Not on file  Food Insecurity:   . Worried About Charity fundraiser in the Last Year: Not on file  . Ran Out of Food in the Last Year: Not on file  Transportation Needs:   . Lack of Transportation (Medical): Not on file  . Lack of Transportation (Non-Medical): Not on file  Physical Activity:   . Days of Exercise per Week: Not on file  . Minutes of Exercise per Session: Not on file  Stress:   . Feeling of Stress : Not on file  Social Connections:   . Frequency of Communication with Friends and Family: Not on file  . Frequency of Social Gatherings with Friends and Family: Not on file  . Attends Religious Services: Not on file  . Active Member of Clubs or Organizations: Not on file  . Attends Archivist Meetings:  Not on file  . Marital Status: Not on file  Intimate Partner Violence:   . Fear of Current or Ex-Partner: Not on file  . Emotionally Abused: Not on file  . Physically Abused: Not on file  . Sexually Abused: Not on file    Family History:   The patient's family history includes Arthritis in her daughter; CAD in her father and mother; Heart Problems in her brother; Heart attack in her mother.  ROS:  Please see the history of present illness.  ROS  All other ROS reviewed and negative.     Physical Exam/Data:   Vitals:  02/21/20 0706 02/21/20 0821  BP: 140/61   Pulse: 77   Resp: 16   Temp: 97.7 F (36.5 C)   TempSrc: Oral   SpO2: 99% 94%  Weight: 77.1 kg   Height: 5' 2.5" (1.588 m)    No intake or output data in the 24 hours ending 02/21/20 0851 Filed Weights   02/21/20 0706  Weight: 77.1 kg   Body mass index is 30.6 kg/m.  General:  Well nourished, well developed, in no acute distress,laying flat after cath HEENT: normal Lymph: no adenopathy Neck: no JVD Endocrine:  No thryoid Cardiac:  normal S1, S2; RRR; 3/6 SEM Lungs:  clear to auscultation bilaterally, no wheezing, rhonchi or rales  Abd: soft, nontender, no hepatomegaly  Ext: no edema Musculoskeletal:  No deformities, BUE and BLE strength normal and equal Skin: warm and dry  Neuro:  CNs 2-12 intact, no focal abnormalities noted Psych:  Normal affect   EKG:  The EKG was personally reviewed and demonstrates: Sinus with anterolateral TWIs Tele: sinus with frequent PVCs  Relevant CV Studies: Echo 10/24/19 IMPRESSIONS  1. Left ventricular ejection fraction, by estimation, is 60 to 65%. The  left ventricle has normal function. The left ventricle has no regional  wall motion abnormalities. Left ventricular diastolic function could not  be evaluated. Elevated left  ventricular end-diastolic pressure.  2. Right ventricular systolic function is normal. The right ventricular  size is normal. There is moderately  elevated pulmonary artery systolic  pressure.  3. Left atrial size was mildly dilated.  4. The mitral valve is abnormal. Trivial mitral valve regurgitation.  5. The aortic valve is tricuspid. Aortic valve regurgitation is trivial.  Severe aortic valve stenosis. Aortic valve area, by VTI measures 0.76 cm.  Aortic valve mean gradient measures 32.0 mmHg. Aortic valve Vmax measures  3.77 m/s.  6. The inferior vena cava is dilated in size with <50% respiratory  variability, suggesting right atrial pressure of 15 mmHg.   Comparison(s): Changes from prior study are noted. 02/02/2018: LVEF 60-65%,  moderate AS - mean gradient 22 mmHg.   FINDINGS  Left Ventricle: Left ventricular ejection fraction, by estimation, is 60  to 65%. The left ventricle has normal function. The left ventricle has no  regional wall motion abnormalities. The left ventricular internal cavity  size was normal in size. There is  no left ventricular hypertrophy. Left ventricular diastolic function  could not be evaluated due to indeterminate diastolic function. Left  ventricular diastolic function could not be evaluated. Elevated left  ventricular end-diastolic pressure.   Right Ventricle: The right ventricular size is normal. No increase in  right ventricular wall thickness. Right ventricular systolic function is  normal. There is moderately elevated pulmonary artery systolic pressure.  The tricuspid regurgitant velocity is  2.80 m/s, and with an assumed right atrial pressure of 15 mmHg, the  estimated right ventricular systolic pressure is 16.1 mmHg.   Left Atrium: Left atrial size was mildly dilated.   Right Atrium: Right atrial size was normal in size.   Pericardium: There is no evidence of pericardial effusion.   Mitral Valve: The mitral valve is abnormal. There is mild calcification of  the posterior mitral valve leaflet(s). Mild to moderate mitral annular  calcification. Trivial mitral valve  regurgitation.   Tricuspid Valve: The tricuspid valve is grossly normal. Tricuspid valve  regurgitation is mild.   Aortic Valve: The aortic valve is tricuspid. Aortic valve regurgitation is  trivial. Severe aortic stenosis is present. Aortic valve mean  gradient  measures 32.0 mmHg. Aortic valve peak gradient measures 56.9 mmHg. Aortic  valve area, by VTI measures 0.76  cm.   Pulmonic Valve: The pulmonic valve was normal in structure. Pulmonic valve  regurgitation is trivial.   Aorta: The aortic root and ascending aorta are structurally normal, with  no evidence of dilitation.   Venous: The inferior vena cava is dilated in size with less than 50%  respiratory variability, suggesting right atrial pressure of 15 mmHg.   IAS/Shunts: No atrial level shunt detected by color flow Doppler.     LEFT VENTRICLE  PLAX 2D  LVIDd:     4.50 cm Diastology  LVIDs:     3.20 cm LV e' lateral:  4.62 cm/s  LV PW:     1.00 cm LV E/e' lateral: 27.2  LV IVS:    1.00 cm LV e' medial:  4.70 cm/s  LVOT diam:   2.00 cm LV E/e' medial: 26.7  LV SV:     54  LV SV Index:  31  LVOT Area:   3.14 cm     RIGHT VENTRICLE  RV S prime:   8.51 cm/s  TAPSE (M-mode): 1.4 cm   LEFT ATRIUM       Index    RIGHT ATRIUM      Index  LA diam:    4.00 cm 2.26 cm/m RA Area:   16.10 cm  LA Vol (A2C):  44.4 ml 25.10 ml/m RA Volume:  41.80 ml 23.63 ml/m  LA Vol (A4C):  63.4 ml 35.84 ml/m  LA Biplane Vol: 54.5 ml 30.81 ml/m  AORTIC VALVE  AV Area (Vmax):  0.80 cm  AV Area (Vmean):  0.70 cm  AV Area (VTI):   0.76 cm  AV Vmax:      377.00 cm/s  AV Vmean:     272.000 cm/s  AV VTI:      0.713 m  AV Peak Grad:   56.9 mmHg  AV Mean Grad:   32.0 mmHg  LVOT Vmax:     95.80 cm/s  LVOT Vmean:    60.800 cm/s  LVOT VTI:     0.172 m  LVOT/AV VTI ratio: 0.24    AORTA  Ao Root diam: 2.80 cm   MITRAL VALVE         TRICUSPID VALVE  MV Area (PHT): 3.90 cm   TR Peak grad:  31.4 mmHg  MV Decel Time: 195 msec   TR Vmax:    280.00 cm/s  MV E velocity: 125.50 cm/s  MV A velocity: 62.25 cm/s  SHUNTS  MV E/A ratio: 2.02     Systemic VTI: 0.17 m               Systemic Diam: 2.00 cm  _____________________   Cath 01/29/20 INTRAVASCULAR PRESSURE WIRE/FFR STUDY  RIGHT/LEFT HEART CATH AND CORONARY ANGIOGRAPHY  Conclusion    Ost LAD to Prox LAD lesion is 70% stenosed. Significant by DFR, 0.74.  Distal area of Previously placed Mid LAD stent (unknown type) is widely patent.  Dist LM to Ost LAD lesion is 25% stenosed.  Ost RCA to Prox RCA lesion is 75% stenosed.  LV end diastolic pressure is mildly elevated.  Hemodynamic findings consistent with moderate pulmonary hypertension.  Ao 90%, PA sat 62%, CO 6.9 L/min; CI 3.88; PA pressure 58/16, mean PA pressure 37 mm Hg; mean PCWP 22- significant V waves noted on wedge tracing   Severe proximal, two vessel disease.  Ostial RCA is  favorable for PCI.  Proximal LAD would be more technically challenging.   Restrictive physiology likely given V waves.   Plan for surgical consult to decide about CABG/AVR vs PCI/TAVR.  Case d/w Dr. Burt Knack as well.   Recommendations   ________________   02/21/20 CORONARY STENT INTERVENTION  CORONARY ATHERECTOMY  Intravascular Ultrasound/IVUS  Conclusion  Ost RCA to Prox RCA lesion is 75% stenosed.  A drug-eluting stent was successfully placed using a SYNERGY XD 3.50X16, postdilated to > 4 mm.  Post intervention, there is a 0% residual stenosis.  Dist LM to Ost LAD lesion is 25% stenosed. Ost LAD to Prox LAD lesion is 75% stenosed.  After orbital atherectomy, a drug-eluting stent was successfully placed using a SYNERGY XD 3.0X24, postdilated to 3.4 in the mid vessel and > 4 mm in the proximal/ostial LAD and left main. Stent optimized with IVUS.  Post intervention, there  is a 0% residual stenosis.  Ost Cx to Prox Cx lesion is 75% stenosed, after being jailed by the stent.  Balloon angioplasty was performed using a BALLOON SAPPHIRE 2.0X12.  Post intervention, there is a 10% residual stenosis.   Successful PCI of the ostial RCA, followed by the left main/LADContinue dual antiplatelet therapy.  Struts to circumflex opened with small balloon as well since the circumflex was jailed. Continue DAPT.  Will hold Eliquis for now.      Laboratory Data:  Chemistry Recent Labs  Lab 02/19/20 0833  NA 140  K 4.1  CL 105  CO2 23  GLUCOSE 132*  BUN 17  CREATININE 1.13*  CALCIUM 9.5  GFRNONAA 45*  GFRAA 52*    No results for input(s): PROT, ALBUMIN, AST, ALT, ALKPHOS, BILITOT in the last 168 hours. Hematology Recent Labs  Lab 02/19/20 0833  WBC 11.8*  RBC 3.11*  HGB 8.3*  HCT 27.1*  MCV 87  MCH 26.7  MCHC 30.6*  RDW 17.9*  PLT 405   Cardiac EnzymesNo results for input(s): TROPONINI in the last 168 hours. No results for input(s): TROPIPOC in the last 168 hours.  BNPNo results for input(s): BNP, PROBNP in the last 168 hours.  DDimer No results for input(s): DDIMER in the last 168 hours.  Radiology/Studies:  No results found.   STS Risk Calculator: STS score Procedure: AVR + CAB Risk of Mortality: 16.749% Renal Failure: 13.400% Permanent Stroke: 2.504% Prolonged Ventilation: 30.284% DSW Infection: 3.990% Reoperation: 5.915% Morbidity or Mortality: 43.043% Short Length of Stay: 2.668% Long Length of Stay: 50.942%  ______________________  [facility] Cardiomyopathy Questionnaire  KCCQ-12 02/13/2020  1 a. Ability to shower/bathe Not at all limited  1 b. Ability to walk 1 block Quite a bit limited  1 c. Ability to hurry/jog Extremely limited  2. Edema feet/ankles/legs Less than once a week  3. Limited by fatigue Less than once a week  4. Limited by dyspnea 1-2 times a week  5. Sitting up / on 3+ pillows Never over the past  2 weeks  6. Limited enjoyment of life Moderately limited  7. Rest of life w/ symptoms Not at all satisfied  8 a. Participation in hobbies Limited quite a bit  8 b. Participation in chores Severely limited  8 c. Visiting family/friends Limited quite a bit       Assessment and Plan:   TEEA Ward is a 86 y.o. female with symptoms of severe, stage D3 aortic stenosis with NYHA Class III symptoms. I have reviewed the patient's recent echocardiogram which is notable for preserved  LV systolic function and severe aortic stenosis with peak gradient of 56.9 mm hg and mean transvalvular gradient of 32 mm hg. The patient's dimensionless index is 0.24 and calculated aortic valve area is 0.81 cm. SVI 31.   L/RHC on 01/29/2020 revealed severe proximal two-vessel CAD with ostial stenosis of the RCA and 70% proximal stenosis of the LAD prior to widely patent previously placed stents.  There was nonobstructive disease in the left circumflex. Mean transvalvular gradient across the aortic valve was measured 16.8 mmHg on pullback corresponding to aortic valve area calculated 1.59 cm.  Pulmonary artery pressures were moderately elevated.   She underwent gardiac gated CTA of the heart which revealed anatomical characteristics consistent with aortic stenosis suitable for treatment by transcatheter aortic valve replacement without any significant complicating features, although the size of the patient's aortic root and aortic annulus was relatively small and there was significant calcification noted in the sinotubular junction and the aortic root.  Gated CTA of the aorta and iliac vessels demonstrated what appear to be adequate pelvic vascular access to facilitate a transfemoral approach.     Today, she underwent successful successful PCI of the ostial RCA, followed by oribital atherectomy and PCI of the left main/LAD. Struts to circumflex opened with small balloon as well since the circumflex was jailed. She was  continued on DAPT with aspirin and Plavix and Eliquis held.    I have reviewed the natural history of aortic stenosis with the patient. We have discussed the limitations of medical therapy and the poor prognosis associated with symptomatic aortic stenosis. We have reviewed potential treatment options, including palliative medical therapy, conventional surgical aortic valve replacement, and transcatheter aortic valve replacement. We discussed treatment options in the context of this patient's specific comorbid medical conditions.    The patient's predicted risk of mortality with conventional aortic valve replacement is 16.749% primarily based on age, cad, chf, htn, obesity and anemia. Other significant comorbid conditions include physical deconditioning with limited mobility. She has already been evaluated by Dr. Roxy Manns and felt to be too high risk for CAB + SAVR so we elected to proceed with PCI + TAVR. PCI was completed today and she is tentatively scheduled for TAVR on 03/03/20.   Dr. Burt Knack to follow.    Signed, Angelena Form, PA-C  02/21/2020 8:51 AM   Patient seen, examined. Available data reviewed. Agree with findings, assessment, and plan as outlined by Nell Range, PA-C.  The patient is independently interviewed and examined.  She is an elderly woman in no distress.  Carotid upstrokes are brisk with bilateral bruits.  Jugular venous pressure is normal.  Lungs are clear to auscultation bilaterally, heart is regular rate and rhythm with a grade 3/6 late peaking harsh crescendo decrescendo murmur best heard at the apex.  Abdomen is soft and nontender with no masses.  Extremities have no edema.  Skin is warm and dry without rash.  I have personally reviewed all of the patient's preoperative imaging studies.  Her echocardiogram shows preserved LV systolic function with an LVEF of 65%.  The aortic valve is severely calcified and restricted.  The noncoronary cusp is completely immobile and there is  limited mobility of the left and right cusps.  Peak and mean transvalvular gradients are 57 and 32 mmHg, respectively and the dimensionless index is 0.24. Calculated AVA is 0.7 square cm. Cardiac cath films are reviewed, as are today's PCI films involving PCI of the RCA and left main/LAD.   She clearly has progressive  symptoms of worsening, chronic diastolic heart failure now with NYHA functional class III limitation. I think she has severe, Stage D3, aortic stenosis (paradoxical low flow low gradient).  She has significant comorbid medical conditions including rheumatoid arthritis on chronic immunosuppressants, chronic anemia, advanced age, and physical deconditioning.  TAVR is a reasonable treatment option for this patient, associated with a lower morbidity and conventional surgery.  She has already undergone formal cardiac surgical consultation and is felt to be a poor candidate.  CT angiogram studies demonstrate adequate pelvic access for transfemoral TAVR.  We will have to determine the best timing in light of her current hemoglobin of 8.3.  She has followed in hematology clinic.  I suspect after undergoing a complex interventional procedure today, that her hemoglobin will drop further.  It may be in her best interest to work on improving her hemoglobin/hematocrit prior to TAVR.  This is discussed with the patient and her husband who is at the bedside.  All questions are answered.  We will follow up shortly after discharge to determine timing of TAVR.  Risks, indications, and alternatives of transfemoral TAVR have been reviewed with the patient and her husband.  They understand and are eager to proceed when appropriate.  Sherren Mocha, M.D. 02/21/2020 5:25 PM

## 2020-02-21 NOTE — Interval H&P Note (Signed)
Cath Lab Visit (complete for each Cath Lab visit)  Clinical Evaluation Leading to the Procedure:   ACS: No.  Non-ACS:    Anginal Classification: CCS III  Anti-ischemic medical therapy: Maximal Therapy (2 or more classes of medications)  Non-Invasive Test Results: High-risk stress test findings: cardiac mortality >3%/year  Prior CABG: No previous CABG   Severe AS.  Needs TAVR but not a candidate for CABG/SAVR due to age and comorbidities.  Plan CSI of LAD.  Had a long conversation with the patient on Wednesday by phone explaining CSI.  Plan to treat ostial RCA and ostial LAD.    History and Physical Interval Note:  02/21/2020 8:23 AM  Sophia Ward  has presented today for surgery, with the diagnosis of Coronary Artery Disease Workup for TAVR.  The various methods of treatment have been discussed with the patient and family. After consideration of risks, benefits and other options for treatment, the patient has consented to  Procedure(s): CORONARY STENT INTERVENTION (N/A) CORONARY ATHERECTOMY (N/A) as a surgical intervention.  The patient's history has been reviewed, patient examined, no change in status, stable for surgery.  I have reviewed the patient's chart and labs.  Questions were answered to the patient's satisfaction.     Larae Grooms

## 2020-02-22 DIAGNOSIS — I25119 Atherosclerotic heart disease of native coronary artery with unspecified angina pectoris: Secondary | ICD-10-CM | POA: Diagnosis not present

## 2020-02-22 DIAGNOSIS — M069 Rheumatoid arthritis, unspecified: Secondary | ICD-10-CM | POA: Diagnosis not present

## 2020-02-22 DIAGNOSIS — I209 Angina pectoris, unspecified: Secondary | ICD-10-CM

## 2020-02-22 DIAGNOSIS — I350 Nonrheumatic aortic (valve) stenosis: Secondary | ICD-10-CM | POA: Diagnosis not present

## 2020-02-22 DIAGNOSIS — Z6831 Body mass index (BMI) 31.0-31.9, adult: Secondary | ICD-10-CM | POA: Diagnosis not present

## 2020-02-22 DIAGNOSIS — D84821 Immunodeficiency due to drugs: Secondary | ICD-10-CM | POA: Diagnosis not present

## 2020-02-22 DIAGNOSIS — I5032 Chronic diastolic (congestive) heart failure: Secondary | ICD-10-CM | POA: Diagnosis not present

## 2020-02-22 DIAGNOSIS — I252 Old myocardial infarction: Secondary | ICD-10-CM | POA: Diagnosis not present

## 2020-02-22 DIAGNOSIS — E669 Obesity, unspecified: Secondary | ICD-10-CM | POA: Diagnosis not present

## 2020-02-22 DIAGNOSIS — I352 Nonrheumatic aortic (valve) stenosis with insufficiency: Secondary | ICD-10-CM | POA: Diagnosis not present

## 2020-02-22 DIAGNOSIS — Z79899 Other long term (current) drug therapy: Secondary | ICD-10-CM | POA: Diagnosis not present

## 2020-02-22 DIAGNOSIS — G8929 Other chronic pain: Secondary | ICD-10-CM | POA: Diagnosis not present

## 2020-02-22 DIAGNOSIS — Z7901 Long term (current) use of anticoagulants: Secondary | ICD-10-CM | POA: Diagnosis not present

## 2020-02-22 DIAGNOSIS — I480 Paroxysmal atrial fibrillation: Secondary | ICD-10-CM | POA: Diagnosis not present

## 2020-02-22 LAB — CBC
HCT: 24.7 % — ABNORMAL LOW (ref 36.0–46.0)
Hemoglobin: 7.5 g/dL — ABNORMAL LOW (ref 12.0–15.0)
MCH: 27.2 pg (ref 26.0–34.0)
MCHC: 30.4 g/dL (ref 30.0–36.0)
MCV: 89.5 fL (ref 80.0–100.0)
Platelets: 345 10*3/uL (ref 150–400)
RBC: 2.76 MIL/uL — ABNORMAL LOW (ref 3.87–5.11)
RDW: 19.3 % — ABNORMAL HIGH (ref 11.5–15.5)
WBC: 10.8 10*3/uL — ABNORMAL HIGH (ref 4.0–10.5)
nRBC: 0 % (ref 0.0–0.2)

## 2020-02-22 LAB — GLUCOSE, CAPILLARY
Glucose-Capillary: 116 mg/dL — ABNORMAL HIGH (ref 70–99)
Glucose-Capillary: 161 mg/dL — ABNORMAL HIGH (ref 70–99)
Glucose-Capillary: 163 mg/dL — ABNORMAL HIGH (ref 70–99)
Glucose-Capillary: 104 mg/dL — ABNORMAL HIGH (ref 70–99)

## 2020-02-22 LAB — BASIC METABOLIC PANEL
Anion gap: 12 (ref 5–15)
BUN: 12 mg/dL (ref 8–23)
CO2: 21 mmol/L — ABNORMAL LOW (ref 22–32)
Calcium: 8.8 mg/dL — ABNORMAL LOW (ref 8.9–10.3)
Chloride: 106 mmol/L (ref 98–111)
Creatinine, Ser: 1.19 mg/dL — ABNORMAL HIGH (ref 0.44–1.00)
GFR calc Af Amer: 49 mL/min — ABNORMAL LOW (ref >60)
GFR calc non Af Amer: 42 mL/min — ABNORMAL LOW (ref >60)
Glucose, Bld: 188 mg/dL — ABNORMAL HIGH (ref 70–99)
Potassium: 3.7 mmol/L (ref 3.5–5.1)
Sodium: 139 mmol/L (ref 135–145)

## 2020-02-22 NOTE — Progress Notes (Signed)
Received patient on 2L oxygen via nasal cannula. Attempted to wean patient to room air and O2 saturations dropped to 87% on room air at rest. Placed patient back on 2L at this time; oxygen saturations currently 93%.

## 2020-02-22 NOTE — Progress Notes (Signed)
CARDIAC REHAB PHASE I   PRE:  Rate/Rhythm: SR/75  BP:  Sitting: 121/58  Standing: 106/62      SaO2: 92  MODE:  Ambulation: 125 ft   POST:  Rate/Rhythm: SR/80  BP:  Sitting: 111/45        SaO2: 93   8:35- 10:00  Pt received in bed and agrees to ambulate. Pt ambulated with rolling walker and gait belt. Pt was walked on RA and had SAO2 that went as low as 89-90%. Pt voices feeling "winded. " Took rest break and returned to room.  Pt back to bedside chair with falls precautions in place, call bell at side tray table in reach. SaO2 92-94% sitting in chair. Pt voices feeling well. Provided education on S/P PCI/stents and PTCA. Spouse has stent cards. Encouraged to copy and carry. Reviewed importance of Plavix post procedure. Reviewed S/S to report to MD or when to call 911. Reviewed NTG use. Reviewed wound care, activity limitations and precautions. PT will be referred to [facility] CRP2 program but has planned TAVR for 03/03/2020. PT verbalized understanding of education.  Lesly Rubenstein, MS, ACSM EP-C, CCRP 02/22/2020  10:00

## 2020-02-22 NOTE — Progress Notes (Signed)
Progress Note  Patient Name: Sophia Ward Date of Encounter: 02/22/2020  Primary Cardiologist: Sophia Grooms, MD   Subjective   "I feel good. " denies chest pain or sob.   Inpatient Medications    Scheduled Meds: . aspirin  81 mg Oral Daily  . atorvastatin  40 mg Oral QHS  . clopidogrel  75 mg Oral Q breakfast  . escitalopram  5 mg Oral Daily  . folic acid  2 mg Oral Daily  . insulin aspart  6 Units Subcutaneous TID WC  . insulin detemir  14 Units Subcutaneous q morning - 10a  . metoprolol succinate  100 mg Oral Daily  . pantoprazole  40 mg Oral QPC lunch  . predniSONE  4 mg Oral Q breakfast  . ramipril  10 mg Oral BID  . sodium chloride flush  3 mL Intravenous Q12H  . sodium chloride flush  3 mL Intravenous Q12H   Continuous Infusions: . sodium chloride     PRN Meds: sodium chloride, acetaminophen, ondansetron (ZOFRAN) IV, sodium chloride flush   Vital Signs    Vitals:   02/21/20 2207 02/21/20 2357 02/22/20 0435 02/22/20 0838  BP: (!) 119/56 120/66 (!) 126/58 (!) 114/50  Pulse:    76  Resp:  18 18   Temp:  98 F (36.7 C) 98 F (36.7 C) 98.7 F (37.1 C)  TempSrc:  Oral Oral Axillary  SpO2:    97%  Weight:   77.2 kg   Height:        Intake/Output Summary (Last 24 hours) at 02/22/2020 9381 Last data filed at 02/22/2020 0800 Gross per 24 hour  Intake 1879 ml  Output 1780 ml  Net 99 ml   Filed Weights   02/21/20 0706 02/22/20 0435  Weight: 77.1 kg 77.2 kg    Telemetry    NSR - Personally Reviewed  ECG    none - Personally Reviewed  Physical Exam   WEX:HBZJI elderly woman, No acute distress.   Neck: No JVD Cardiac: RRR, no murmurs, rubs, or gallops.  Respiratory: Clear to auscultation bilaterally. GI: Soft, nontender, non-distended  MS: No edema; No deformity. Neuro:  Nonfocal  Psych: Normal affect   Labs    Chemistry Recent Labs  Lab 02/19/20 0833 02/22/20 0844  NA 140 139  K 4.1 3.7  CL 105 106  CO2 23 21*  GLUCOSE  132* 188*  BUN 17 12  CREATININE 1.13* 1.19*  CALCIUM 9.5 8.8*  GFRNONAA 45* 42*  GFRAA 52* 49*  ANIONGAP  --  12     Hematology Recent Labs  Lab 02/19/20 0833 02/22/20 0844  WBC 11.8* 10.8*  RBC 3.11* 2.76*  HGB 8.3* 7.5*  HCT 27.1* 24.7*  MCV 87 89.5  MCH 26.7 27.2  MCHC 30.6* 30.4  RDW 17.9* 19.3*  PLT 405 345    Cardiac EnzymesNo results for input(s): TROPONINI in the last 168 hours. No results for input(s): TROPIPOC in the last 168 hours.   BNPNo results for input(s): BNP, PROBNP in the last 168 hours.   DDimer No results for input(s): DDIMER in the last 168 hours.   Radiology    CARDIAC CATHETERIZATION  Addendum Date: 02/21/2020    Ost RCA to Prox RCA lesion is 75% stenosed.  A drug-eluting stent was successfully placed using a SYNERGY XD 3.50X16, postdilated to > 4 mm.  Post intervention, there is a 0% residual stenosis.  Dist LM to Ost LAD lesion is 25% stenosed. Ost LAD to  Prox LAD lesion is 75% stenosed.  After orbital atherectomy, a drug-eluting stent was successfully placed using a SYNERGY XD 3.0X24, postdilated to 3.4 in the mid vessel and > 4 mm in the proximal/ostial LAD and left main. Stent optimized with IVUS.  Post intervention, there is a 0% residual stenosis.  Ost Cx to Prox Cx lesion is 75% stenosed, after being jailed by the stent.  Balloon angioplasty was performed using a BALLOON SAPPHIRE 2.0X12.  Post intervention, there is a 10% residual stenosis.  Successful PCI of the ostial RCA, followed by the left main/LADContinue dual antiplatelet therapy.  Struts to circumflex opened with small balloon as well since the circumflex was jailed. Continue DAPT.  Will hold Eliquis for now.  Plan for TAVR w/u.   Result Date: 02/21/2020  Ost RCA to Prox RCA lesion is 75% stenosed.  A drug-eluting stent was successfully placed using a SYNERGY XD 3.50X16, postdilated to > 4 mm.  Post intervention, there is a 0% residual stenosis.  Dist LM to Ost LAD lesion is  25% stenosed. Ost LAD to Prox LAD lesion is 75% stenosed.  After orbital atherectomy, a drug-eluting stent was successfully placed using a SYNERGY XD 3.0X24, postdilated to 3.4 in the mid vessel and > 4 mm in the proximal/ostial LAD and left main.  Post intervention, there is a 0% residual stenosis.  Ost Cx to Prox Cx lesion is 75% stenosed, after being jailed by the stent.  Balloon angioplasty was performed using a BALLOON SAPPHIRE 2.0X12.  Post intervention, there is a 10% residual stenosis.  Successful PCI of the ostial RCA, followed by the left main/LADContinue dual antiplatelet therapy.  Struts to circumflex opened with small balloon as well since the circumflex was jailed. Continue DAPT.  Will hold Eliquis for now.  Plan for TAVR w/u.    Cardiac Studies   none  Patient Profile     86 y.o. female admitted with 2 vessel CAD, s/p PCI. Has severe AS, s/p TAVR consult.  Assessment & Plan    1. S/p PCI = she is stable and appears to be pain free. Continue current guideline directed meds. 2. Anemia - her H/H is down. We will recheck tomorrow. 3. AS - TAVR evaluation noted.  4. Dyspnea - despite her anemia, she is close to her baseline. We will follow.  For questions or updates, please contact [place] Please consult [email or web address] for contact info under Cardiology/STEMI.      Signed, Sophia Peru, MD  02/22/2020, 9:52 AM  Patient ID: Sophia Ward, female   DOB: [name] 20, 1933, 86 y.o.   MRN: [id number]

## 2020-02-23 DIAGNOSIS — I350 Nonrheumatic aortic (valve) stenosis: Secondary | ICD-10-CM | POA: Diagnosis not present

## 2020-02-23 DIAGNOSIS — D500 Iron deficiency anemia secondary to blood loss (chronic): Secondary | ICD-10-CM

## 2020-02-23 LAB — CBC
HCT: 24.8 % — ABNORMAL LOW (ref 36.0–46.0)
Hemoglobin: 7.3 g/dL — ABNORMAL LOW (ref 12.0–15.0)
MCH: 26.3 pg (ref 26.0–34.0)
MCHC: 29.4 g/dL — ABNORMAL LOW (ref 30.0–36.0)
MCV: 89.2 fL (ref 80.0–100.0)
Platelets: 333 10*3/uL (ref 150–400)
RBC: 2.78 MIL/uL — ABNORMAL LOW (ref 3.87–5.11)
RDW: 19.2 % — ABNORMAL HIGH (ref 11.5–15.5)
WBC: 10.7 10*3/uL — ABNORMAL HIGH (ref 4.0–10.5)
nRBC: 0 % (ref 0.0–0.2)

## 2020-02-23 LAB — GLUCOSE, CAPILLARY
Glucose-Capillary: 114 mg/dL — ABNORMAL HIGH (ref 70–99)
Glucose-Capillary: 154 mg/dL — ABNORMAL HIGH (ref 70–99)
Glucose-Capillary: 137 mg/dL — ABNORMAL HIGH (ref 70–99)
Glucose-Capillary: 158 mg/dL — ABNORMAL HIGH (ref 70–99)

## 2020-02-23 LAB — IRON AND TIBC
Iron: 12 ug/dL — ABNORMAL LOW (ref 28–170)
Saturation Ratios: 3 % — ABNORMAL LOW (ref 10.4–31.8)
TIBC: 361 ug/dL (ref 250–450)
UIBC: 349 ug/dL

## 2020-02-23 LAB — RETICULOCYTES
Immature Retic Fract: 18.4 % — ABNORMAL HIGH (ref 2.3–15.9)
RBC.: 2.88 MIL/uL — ABNORMAL LOW (ref 3.87–5.11)
Retic Count, Absolute: 46.4 10*3/uL (ref 19.0–186.0)
Retic Ct Pct: 1.6 % (ref 0.4–3.1)

## 2020-02-23 LAB — FOLATE: Folate: >100 ng/mL (ref >5.9)

## 2020-02-23 LAB — VITAMIN B12: Vitamin B-12: 407 pg/mL (ref 180–914)

## 2020-02-23 LAB — FERRITIN: Ferritin: 41 ng/mL (ref 11–307)

## 2020-02-23 MED ORDER — FERROUS SULFATE 325 (65 FE) MG PO TABS
325.0000 mg | ORAL_TABLET | Freq: Two times a day (BID) | ORAL | Status: DC
  Administered 2020-02-23 – 2020-02-25 (×4): 325 mg via ORAL
  Filled 2020-02-23 (×4): qty 1

## 2020-02-23 NOTE — Care Management Obs Status (Signed)
[place] NOTIFICATION   Patient Details  Name: AGNES BRIGHTBILL MRN: [id number] Date of Birth: 1933-03-13   Medicare Observation Status Notification Given:  Yes    Dawayne Patricia, RN 02/23/2020, 2:49 PM

## 2020-02-23 NOTE — Progress Notes (Signed)
Progress Note  Patient Name: Sophia Ward  Date of Encounter: 02/23/2020  Primary Cardiologist: Larae Grooms, MD   Subjective   Feels better. Denies chest pain or sob. She walked yesterday.   Inpatient Medications    Scheduled Meds: . aspirin  81 mg Oral Daily  . atorvastatin  40 mg Oral QHS  . clopidogrel  75 mg Oral Q breakfast  . escitalopram  5 mg Oral Daily  . ferrous sulfate  325 mg Oral BID WC  . folic acid  2 mg Oral Daily  . insulin aspart  6 Units Subcutaneous TID WC  . insulin detemir  14 Units Subcutaneous q morning - 10a  . metoprolol succinate  100 mg Oral Daily  . pantoprazole  40 mg Oral QPC lunch  . predniSONE  4 mg Oral Q breakfast  . ramipril  10 mg Oral BID  . sodium chloride flush  3 mL Intravenous Q12H  . sodium chloride flush  3 mL Intravenous Q12H   Continuous Infusions: . sodium chloride     PRN Meds: sodium chloride, acetaminophen, ondansetron (ZOFRAN) IV, sodium chloride flush   Vital Signs    Vitals:   02/22/20 1205 02/22/20 1434 02/22/20 2002 02/23/20 0507  BP: (!) 107/57 (!) 112/56 (!) 110/42 122/63  Pulse:  72    Resp:  18 16 18   Temp: 98.1 F (36.7 C) 98.1 F (36.7 C) 98 F (36.7 C) 98 F (36.7 C)  TempSrc: Axillary Oral Oral Oral  SpO2: 92% 90%    Weight:    77.2 kg  Height:        Intake/Output Summary (Last 24 hours) at 02/23/2020 1041 Last data filed at 02/22/2020 2217 Gross per 24 hour  Intake 423 ml  Output 200 ml  Net 223 ml   Filed Weights   02/21/20 0706 02/22/20 0435 02/23/20 0507  Weight: 77.1 kg 77.2 kg 77.2 kg    Telemetry    nsr - Personally Reviewed  ECG    none - Personally Reviewed  Physical Exam   GEN: No acute distress.   Neck: No JVD Cardiac: RRR, AS murmurs, rubs, or gallops.  Respiratory: Clear to auscultation bilaterally. GI: Soft, nontender, non-distended  MS: No edema; No deformity. Neuro:  Nonfocal  Psych: Normal affect   Labs    Chemistry Recent Labs  Lab  02/19/20 0833 02/22/20 0844  NA 140 139  K 4.1 3.7  CL 105 106  CO2 23 21*  GLUCOSE 132* 188*  BUN 17 12  CREATININE 1.13* 1.19*  CALCIUM 9.5 8.8*  GFRNONAA 45* 42*  GFRAA 52* 49*  ANIONGAP  --  12     Hematology Recent Labs  Lab 02/19/20 0833 02/22/20 0844 02/23/20 0917  WBC 11.8* 10.8* 10.7*  RBC 3.11* 2.76* 2.78*  HGB 8.3* 7.5* 7.3*  HCT 27.1* 24.7* 24.8*  MCV 87 89.5 89.2  MCH 26.7 27.2 26.3  MCHC 30.6* 30.4 29.4*  RDW 17.9* 19.3* 19.2*  PLT 405 345 333    Cardiac EnzymesNo results for input(s): TROPONINI in the last 168 hours. No results for input(s): TROPIPOC in the last 168 hours.   BNPNo results for input(s): BNP, PROBNP in the last 168 hours.   DDimer No results for input(s): DDIMER in the last 168 hours.   Radiology    No results found.  Cardiac Studies   none  Patient Profile     86 y.o. female admitted with chest pain, 2 vessel CAD, severe AS and anemia.  Assessment & Plan    1. CAD - she is s/p PCI. No chest pain. 2. Anemia - her H/H is a bit lower today. I will recheck tomorrow. 3. AS - she is s/p TAVR eval and pending procedure. 4. Dyspnea - improved.  5. Disp. - she can go home tomorrow if H/H is improved. Although her hgb not much worse than yesterday, I would like to see it going up. She would not tolerate an occult GI bleed with her AS and advanced age.  For questions or updates, please contact [place] Please consult [email or web address] for contact info under Cardiology/STEMI.      Signed, Cristopher Peru, MD  02/23/2020, 10:41 AM  Patient ID: Sophia Ward, female   DOB: July 01, 1932, 86 y.o.   MRN: [id number]

## 2020-02-24 ENCOUNTER — Encounter (HOSPITAL_COMMUNITY): Payer: Self-pay | Admitting: Interventional Cardiology

## 2020-02-24 ENCOUNTER — Inpatient Hospital Stay (HOSPITAL_COMMUNITY): Payer: Medicare Other

## 2020-02-24 ENCOUNTER — Encounter: Payer: Medicare Other | Admitting: Thoracic Surgery (Cardiothoracic Vascular Surgery)

## 2020-02-24 DIAGNOSIS — Z01810 Encounter for preprocedural cardiovascular examination: Secondary | ICD-10-CM | POA: Diagnosis not present

## 2020-02-24 DIAGNOSIS — Z79899 Other long term (current) drug therapy: Secondary | ICD-10-CM | POA: Diagnosis not present

## 2020-02-24 DIAGNOSIS — I350 Nonrheumatic aortic (valve) stenosis: Secondary | ICD-10-CM | POA: Diagnosis not present

## 2020-02-24 DIAGNOSIS — Z9842 Cataract extraction status, left eye: Secondary | ICD-10-CM | POA: Diagnosis not present

## 2020-02-24 DIAGNOSIS — Z885 Allergy status to narcotic agent status: Secondary | ICD-10-CM | POA: Diagnosis not present

## 2020-02-24 DIAGNOSIS — Z888 Allergy status to other drugs, medicaments and biological substances status: Secondary | ICD-10-CM | POA: Diagnosis not present

## 2020-02-24 DIAGNOSIS — E782 Mixed hyperlipidemia: Secondary | ICD-10-CM | POA: Diagnosis not present

## 2020-02-24 DIAGNOSIS — I5032 Chronic diastolic (congestive) heart failure: Secondary | ICD-10-CM | POA: Diagnosis present

## 2020-02-24 DIAGNOSIS — D509 Iron deficiency anemia, unspecified: Secondary | ICD-10-CM | POA: Diagnosis not present

## 2020-02-24 DIAGNOSIS — I352 Nonrheumatic aortic (valve) stenosis with insufficiency: Secondary | ICD-10-CM | POA: Diagnosis present

## 2020-02-24 DIAGNOSIS — M069 Rheumatoid arthritis, unspecified: Secondary | ICD-10-CM | POA: Diagnosis present

## 2020-02-24 DIAGNOSIS — G8929 Other chronic pain: Secondary | ICD-10-CM | POA: Diagnosis present

## 2020-02-24 DIAGNOSIS — Z961 Presence of intraocular lens: Secondary | ICD-10-CM | POA: Diagnosis present

## 2020-02-24 DIAGNOSIS — Z90710 Acquired absence of both cervix and uterus: Secondary | ICD-10-CM | POA: Diagnosis not present

## 2020-02-24 DIAGNOSIS — I209 Angina pectoris, unspecified: Secondary | ICD-10-CM | POA: Diagnosis not present

## 2020-02-24 DIAGNOSIS — I10 Essential (primary) hypertension: Secondary | ICD-10-CM | POA: Diagnosis not present

## 2020-02-24 DIAGNOSIS — D84821 Immunodeficiency due to drugs: Secondary | ICD-10-CM | POA: Diagnosis present

## 2020-02-24 DIAGNOSIS — M4800 Spinal stenosis, site unspecified: Secondary | ICD-10-CM | POA: Diagnosis present

## 2020-02-24 DIAGNOSIS — Z8249 Family history of ischemic heart disease and other diseases of the circulatory system: Secondary | ICD-10-CM | POA: Diagnosis not present

## 2020-02-24 DIAGNOSIS — Z9841 Cataract extraction status, right eye: Secondary | ICD-10-CM | POA: Diagnosis not present

## 2020-02-24 DIAGNOSIS — Z7901 Long term (current) use of anticoagulants: Secondary | ICD-10-CM | POA: Diagnosis not present

## 2020-02-24 DIAGNOSIS — M8580 Other specified disorders of bone density and structure, unspecified site: Secondary | ICD-10-CM | POA: Diagnosis present

## 2020-02-24 DIAGNOSIS — E119 Type 2 diabetes mellitus without complications: Secondary | ICD-10-CM | POA: Diagnosis present

## 2020-02-24 DIAGNOSIS — I25119 Atherosclerotic heart disease of native coronary artery with unspecified angina pectoris: Secondary | ICD-10-CM | POA: Diagnosis not present

## 2020-02-24 DIAGNOSIS — E669 Obesity, unspecified: Secondary | ICD-10-CM | POA: Diagnosis present

## 2020-02-24 DIAGNOSIS — D649 Anemia, unspecified: Secondary | ICD-10-CM | POA: Diagnosis not present

## 2020-02-24 DIAGNOSIS — Z6831 Body mass index (BMI) 31.0-31.9, adult: Secondary | ICD-10-CM | POA: Diagnosis not present

## 2020-02-24 DIAGNOSIS — K219 Gastro-esophageal reflux disease without esophagitis: Secondary | ICD-10-CM | POA: Diagnosis present

## 2020-02-24 DIAGNOSIS — I480 Paroxysmal atrial fibrillation: Secondary | ICD-10-CM | POA: Diagnosis present

## 2020-02-24 DIAGNOSIS — I252 Old myocardial infarction: Secondary | ICD-10-CM | POA: Diagnosis not present

## 2020-02-24 LAB — PREPARE RBC (CROSSMATCH)

## 2020-02-24 LAB — BASIC METABOLIC PANEL
Anion gap: 10 (ref 5–15)
BUN: 13 mg/dL (ref 8–23)
CO2: 20 mmol/L — ABNORMAL LOW (ref 22–32)
Calcium: 8.7 mg/dL — ABNORMAL LOW (ref 8.9–10.3)
Chloride: 108 mmol/L (ref 98–111)
Creatinine, Ser: 1 mg/dL (ref 0.44–1.00)
GFR calc Af Amer: 60 mL/min — ABNORMAL LOW (ref >60)
GFR calc non Af Amer: 52 mL/min — ABNORMAL LOW (ref >60)
Glucose, Bld: 89 mg/dL (ref 70–99)
Potassium: 3.8 mmol/L (ref 3.5–5.1)
Sodium: 138 mmol/L (ref 135–145)

## 2020-02-24 LAB — CBC
HCT: 24.4 % — ABNORMAL LOW (ref 36.0–46.0)
Hemoglobin: 7.2 g/dL — ABNORMAL LOW (ref 12.0–15.0)
MCH: 26.5 pg (ref 26.0–34.0)
MCHC: 29.5 g/dL — ABNORMAL LOW (ref 30.0–36.0)
MCV: 89.7 fL (ref 80.0–100.0)
Platelets: 350 10*3/uL (ref 150–400)
RBC: 2.72 MIL/uL — ABNORMAL LOW (ref 3.87–5.11)
RDW: 19.2 % — ABNORMAL HIGH (ref 11.5–15.5)
WBC: 12.3 10*3/uL — ABNORMAL HIGH (ref 4.0–10.5)
nRBC: 0 % (ref 0.0–0.2)

## 2020-02-24 LAB — GLUCOSE, CAPILLARY
Glucose-Capillary: 107 mg/dL — ABNORMAL HIGH (ref 70–99)
Glucose-Capillary: 160 mg/dL — ABNORMAL HIGH (ref 70–99)
Glucose-Capillary: 84 mg/dL (ref 70–99)
Glucose-Capillary: 126 mg/dL — ABNORMAL HIGH (ref 70–99)

## 2020-02-24 MED ORDER — SODIUM CHLORIDE 0.9% IV SOLUTION: Freq: Once | INTRAVENOUS | Status: DC

## 2020-02-24 NOTE — Progress Notes (Signed)
CARDIAC REHAB PHASE I   PRE:  Rate/Rhythm: 71 SR  BP:  Supine:   Sitting: 121/43     SaO2: 95% RA  MODE:  Ambulation: 200 ft   POST:  Rate/Rhythem: 89 SR  BP:  Supine:   Sitting: 134/45    SaO2: 92% RA  Pt was lying in bed upon arrival. She was able to independently get out of bed. Pt ambulated with front wheel walker for 200 ft C/O SOB. Had pt perform PLB while walking. Pt had to stop once to rest and catch breath. Returned to recliner with call bell, husband in room.  [id number]    Rick Duff, MS, CEP 02/24/2020 11:39 AM

## 2020-02-24 NOTE — Progress Notes (Addendum)
Progress Note  Patient Name: Sophia Ward Date of Encounter: 02/24/2020  [facility] Cardiologist: Larae Grooms, MD   Subjective   Sitting up eating breakfast. Wearing O2 @ 2L. C/o shortness of breath with activity.   Inpatient Medications    Scheduled Meds: . sodium chloride   Intravenous Once  . aspirin  81 mg Oral Daily  . atorvastatin  40 mg Oral QHS  . clopidogrel  75 mg Oral Q breakfast  . escitalopram  5 mg Oral Daily  . ferrous sulfate  325 mg Oral BID WC  . folic acid  2 mg Oral Daily  . insulin aspart  6 Units Subcutaneous TID WC  . insulin detemir  14 Units Subcutaneous q morning - 10a  . metoprolol succinate  100 mg Oral Daily  . pantoprazole  40 mg Oral QPC lunch  . predniSONE  4 mg Oral Q breakfast  . ramipril  10 mg Oral BID  . sodium chloride flush  3 mL Intravenous Q12H  . sodium chloride flush  3 mL Intravenous Q12H   Continuous Infusions: . sodium chloride     PRN Meds: sodium chloride, acetaminophen, ondansetron (ZOFRAN) IV, sodium chloride flush   Vital Signs    Vitals:   02/23/20 2006 02/23/20 2159 02/24/20 0427 02/24/20 0800  BP:  (!) 125/47 (!) 129/51 (!) 141/53  Pulse:    78  Resp: 18  18 16   Temp: 98.7 F (37.1 C)  98.7 F (37.1 C) 97.6 F (36.4 C)  TempSrc: Oral  Oral Oral  SpO2:      Weight:   77.2 kg   Height:        Intake/Output Summary (Last 24 hours) at 02/24/2020 0844 Last data filed at 02/24/2020 0816 Gross per 24 hour  Intake 6 ml  Output --  Net 6 ml   Last 3 Weights 02/24/2020 02/23/2020 02/22/2020  Weight (lbs) 170 lb 3.1 oz 170 lb 3.1 oz 170 lb 3.1 oz  Weight (kg) 77.2 kg 77.2 kg 77.2 kg      Telemetry    SR - Personally Reviewed  ECG    No new tracing this morning  Physical Exam  Pleasant older WF GEN: No acute distress.   Neck: No JVD Cardiac: RRR,  3/4 systolic murmur, no rubs, or gallops.  Respiratory: Clear to auscultation bilaterally. GI: Soft, nontender, non-distended  MS: No  edema; No deformity. Right radial cath site stable. Bruising noted.  Neuro:  Nonfocal  Psych: Normal affect   Labs    High Sensitivity Troponin:  No results for input(s): TROPONINIHS in the last 720 hours.    Chemistry Recent Labs  Lab 02/19/20 0833 02/22/20 0844 02/24/20 0129  NA 140 139 138  K 4.1 3.7 3.8  CL 105 106 108  CO2 23 21* 20*  GLUCOSE 132* 188* 89  BUN 17 12 13   CREATININE 1.13* 1.19* 1.00  CALCIUM 9.5 8.8* 8.7*  GFRNONAA 45* 42* 52*  GFRAA 52* 49* 60*  ANIONGAP  --  12 10     Hematology Recent Labs  Lab 02/22/20 0844 02/22/20 0844 02/23/20 0917 02/23/20 1518 02/24/20 0129  WBC 10.8*  --  10.7*  --  12.3*  RBC 2.76*   < > 2.78* 2.88* 2.72*  HGB 7.5*  --  7.3*  --  7.2*  HCT 24.7*  --  24.8*  --  24.4*  MCV 89.5  --  89.2  --  89.7  MCH 27.2  --  26.3  --  26.5  MCHC 30.4  --  29.4*  --  29.5*  RDW 19.3*  --  19.2*  --  19.2*  PLT 345  --  333  --  350   < > = values in this interval not displayed.    BNPNo results for input(s): BNP, PROBNP in the last 168 hours.   DDimer No results for input(s): DDIMER in the last 168 hours.   Radiology    No results found.  Cardiac Studies   Cath: 02/21/20   Ost RCA to Prox RCA lesion is 75% stenosed.  A drug-eluting stent was successfully placed using a SYNERGY XD 3.50X16, postdilated to > 4 mm.  Post intervention, there is a 0% residual stenosis.  Dist LM to Ost LAD lesion is 25% stenosed. Ost LAD to Prox LAD lesion is 75% stenosed.  After orbital atherectomy, a drug-eluting stent was successfully placed using a SYNERGY XD 3.0X24, postdilated to 3.4 in the mid vessel and > 4 mm in the proximal/ostial LAD and left main. Stent optimized with IVUS.  Post intervention, there is a 0% residual stenosis.  Ost Cx to Prox Cx lesion is 75% stenosed, after being jailed by the stent.  Balloon angioplasty was performed using a BALLOON SAPPHIRE 2.0X12.  Post intervention, there is a 10% residual stenosis.    Successful PCI of the ostial RCA, followed by the left main/LADContinue dual antiplatelet therapy.  Struts to circumflex opened with small balloon as well since the circumflex was jailed.  Continue DAPT.  Will hold Eliquis for now.    Plan for TAVR w/u.   Diagnostic Dominance: Right  Intervention     Patient Profile     86 y.o. female  with PMH of CAD, HTN, HL, PAF, DM and severe AS who presented for outpatient cardiac cath.   Assessment & Plan    1. CAD: underwent cardiac cath noted above with successful PCI/DES of ostial RCA, along with DES x1 to LM into the LAD. Placed on DAPT with ASA/plavix post cath. No chest pain. Ambulate with CR today -- on ASA, statin, plavix and BB  2. Severe AS: seen by Dr. Burt Knack with plans for TAVR 10/5.   3. Anemia: Hgb baseline appears around 8.5. Hgb has trended low over the weekend.  On PO iron. Will transfuse one unit today. Follow up H/H. Would prefer hgb above 8. Suspect this is contributing to her shortness of breath.  -- given her anemia, suspect she may not be a good triple therapy candidate. Will need to address at later time.   4. PAF: her Eliquis has been on hold since cath. Suspect will continue to hold until after TAVR given close timing.  -- SR on telemetry  5. HL: on statin  6. DM: on SSI and levemir   For questions or updates, please contact [place] Please consult [email or web address] for contact info under        Signed, Reino Bellis, NP  02/24/2020, 8:44 AM    I have examined the patient and reviewed assessment and plan and discussed with patient.  Agree with above as stated.    Hbg continues to gradually drop.  Given CAD, will give  1 U PRBC.  D/w Dr. Burt Knack as well.    Patient has received IV iron in the past.  Wonder if this can be coordinated with her TAVR admission to help boost Hbg periprocedurally.   Larae Grooms

## 2020-02-24 NOTE — Progress Notes (Signed)
Carotid artery duplex completed. Refer to "CV Proc" under chart review to view preliminary results.  02/24/2020 3:04 PM Kelby Aline., MHA, RVT, RDCS, RDMS

## 2020-02-24 NOTE — Evaluation (Signed)
Physical Therapy Evaluation Patient Details Name: Sophia Ward MRN: [id number] DOB: 08/31/1932 Today's Date: 02/24/2020   History of Present Illness  86 y.o. female with medical history significant of HTN, HLD, PAF, CAD s/p stent, AS, DM type II, RA on MTX, PMR on chronic prednisone, and spinal stenosis who present to the hospital for planned coronary revascularization.. She underwent successful successful PCI of the ostial RCA, followed by oribital atherectomy and PCI of the left main/LAD.    Clinical Impression   02/24/2020 PT TAVR Pre-Assessment  Clinical Impression Statement: Pt is a 87 y.o.female being assessed for pre-TAVR.  Pt reports symptoms of SOB with activity.  Pt has 4/5 strength, ROM WFL, and balance WFL.  Pt ambulated 273ft during the 6 minute walk test requiring to stop following with max HR of 87, lowest O2 sat 91, BP 140/56 during mobility.  5 meter walk test produced an average gait speed of 1.499 which indicates high.  RPE was 7/10 and dyspnea was heavy during mobility.  Pt was limited by SOB.  Pt's frailty rating was 3 which is considered managing well.  Pt at this time does not need any additional PT follow up   General UE/LE Strength and ROM:  Strength (0-5/5) ROM (limited/full)  R UE 4/5 full  L UE 4/5 full  R LE 4/5 full  L LE 4/5 full    6 Minute Walk Test:   Total Distance Walked:236 ft.    Did the pt need a rest break? yes If yes, why? Pain:no; Fatigue:yes; Dyspnea/O2 saturations: yes Comments: pt requiring to sit and stop following 236 ft ambulation; unable to ambulate additional distance   Pre-Test Post-Test  BP 147/58 140/56  HR 74 87  O2 saturations (indicated RA or L/min [place]) 95 91  Modified Borg Dyspnea Scale (0 none-10 maximal) 0 7  RPE (6 very light-10 very hard) 0 7  Comments: pt stating the 6 minute walk test was hard and she became very short of breath requiring increased time to recover  5 Meter Walk Test:  Trial 1 10.86 seconds   Trial 2 11.29 seconds  Trial 3 10.68  seconds  3 Trial Average/Gait Speed 10.94 seconds/ 1.499 ft/sec (<1.8 ft/sec indicates high fall risk)  Comments: pt is high fall risk based on 5 meter walk test  Clinical Frailty Scale (1 very fit - 9 terminally ill): 3 (</= 5/12 is considered frail)   Lyanne Co, DPT Acute Rehabilitation Services [phone]                Follow Up Recommendations No PT follow up    Equipment Recommendations       Recommendations for Other Services       Precautions / Restrictions Precautions Precautions: Fall      Mobility  Bed Mobility Overal bed mobility: Independent                Transfers Overall transfer level: Independent                  Ambulation/Gait Ambulation/Gait assistance: Independent Gait Distance (Feet): 236 Feet     Gait velocity: decreased Gait speed 1.499 ft/second      Stairs            Wheelchair Mobility    Modified Rankin (Stroke Patients Only)       Balance Overall balance assessment: Modified Independent  Pertinent Vitals/Pain Pain Assessment: No/denies pain    Home Living Family/patient expects to be discharged to:: Private residence Living Arrangements: Spouse/significant other Available Help at Discharge: Family Type of Home: House Home Access: Stairs to enter Entrance Stairs-Rails: Can reach both Entrance Stairs-Number of Steps: 1 Home Layout: Able to live on main level with bedroom/bathroom Home Equipment: Clinical cytogeneticist - 2 wheels      Prior Function Level of Independence: Independent         Comments: I with all functional mobility tasks     Hand Dominance   Dominant Hand: Right    Extremity/Trunk Assessment   Upper Extremity Assessment Upper Extremity Assessment: Overall WFL for tasks assessed    Lower Extremity Assessment Lower Extremity Assessment: Overall WFL for tasks  assessed       Communication   Communication: No difficulties  Cognition Arousal/Alertness: Awake/alert Behavior During Therapy: WFL for tasks assessed/performed Overall Cognitive Status: Within Functional Limits for tasks assessed                                        General Comments General comments (skin integrity, edema, etc.): pt refused to trial stair due to fatigue; therapist provided demonstration of standing exercises (hip flexion, marching, hip abduction, hip extension) with wall support for strengthening and to improve standing tolerance; educated on keeping RPE in moderate range    Exercises     Assessment/Plan    PT Assessment Patent does not need any further PT services  PT Problem List         PT Treatment Interventions      PT Goals (Current goals can be found in the Care Plan section)  Acute Rehab PT Goals Patient Stated Goal: "I want to go home" Time For Goal Achievement: 03/09/20 Potential to Achieve Goals: Good    Frequency     Barriers to discharge        Co-evaluation               AM-PAC PT "6 Clicks" Mobility  Outcome Measure Help needed turning from your back to your side while in a flat bed without using bedrails?: None Help needed moving from lying on your back to sitting on the side of a flat bed without using bedrails?: None Help needed moving to and from a bed to a chair (including a wheelchair)?: None Help needed standing up from a chair using your arms (e.g., wheelchair or bedside chair)?: None Help needed to walk in hospital room?: None Help needed climbing 3-5 steps with a railing? : None 6 Click Score: 24    End of Session Equipment Utilized During Treatment: Gait belt Activity Tolerance: Patient tolerated treatment well Patient left: in bed;with call bell/phone within reach Nurse Communication: Mobility status PT Visit Diagnosis: Difficulty in walking, not elsewhere classified (R26.2)    Time:  5009-3818 PT Time Calculation (min) (ACUTE ONLY): 21 min   Charges:   PT Evaluation $PT Eval Low Complexity: 1 Low          Lyanne Co, DPT Acute Rehabilitation Services [phone]  Sophia Ward 02/24/2020, 1:43 PM

## 2020-02-25 ENCOUNTER — Encounter: Payer: Self-pay | Admitting: Physician Assistant

## 2020-02-25 DIAGNOSIS — E782 Mixed hyperlipidemia: Secondary | ICD-10-CM

## 2020-02-25 DIAGNOSIS — I10 Essential (primary) hypertension: Secondary | ICD-10-CM

## 2020-02-25 DIAGNOSIS — D509 Iron deficiency anemia, unspecified: Secondary | ICD-10-CM

## 2020-02-25 LAB — LIPID PANEL
Cholesterol: 121 mg/dL (ref 0–200)
HDL: 49 mg/dL (ref >40)
LDL Cholesterol: 57 mg/dL (ref 0–99)
Total CHOL/HDL Ratio: 2.5 RATIO
Triglycerides: 76 mg/dL (ref <150)
VLDL: 15 mg/dL (ref 0–40)

## 2020-02-25 LAB — CBC
HCT: 29.6 % — ABNORMAL LOW (ref 36.0–46.0)
Hemoglobin: 9 g/dL — ABNORMAL LOW (ref 12.0–15.0)
MCH: 26.5 pg (ref 26.0–34.0)
MCHC: 30.4 g/dL (ref 30.0–36.0)
MCV: 87.1 fL (ref 80.0–100.0)
Platelets: 348 10*3/uL (ref 150–400)
RBC: 3.4 MIL/uL — ABNORMAL LOW (ref 3.87–5.11)
RDW: 18.5 % — ABNORMAL HIGH (ref 11.5–15.5)
WBC: 13.2 10*3/uL — ABNORMAL HIGH (ref 4.0–10.5)
nRBC: 0.2 % (ref 0.0–0.2)

## 2020-02-25 LAB — TYPE AND SCREEN
ABO/RH(D): O POS
Antibody Screen: NEGATIVE
Unit division: 0

## 2020-02-25 LAB — BPAM RBC
Blood Product Expiration Date: 202110262359
ISSUE DATE / TIME: 202109271624
Unit Type and Rh: 5100

## 2020-02-25 LAB — GLUCOSE, CAPILLARY
Glucose-Capillary: 109 mg/dL — ABNORMAL HIGH (ref 70–99)
Glucose-Capillary: 174 mg/dL — ABNORMAL HIGH (ref 70–99)

## 2020-02-25 MED ORDER — ASPIRIN 81 MG PO CHEW
81.0000 mg | CHEWABLE_TABLET | Freq: Every day | ORAL | 2 refills | Status: DC
Start: 2020-02-26 — End: 2020-05-06

## 2020-02-25 MED ORDER — FUROSEMIDE 10 MG/ML IJ SOLN
20.0000 mg | Freq: Once | INTRAMUSCULAR | Status: AC
  Administered 2020-02-25: 20 mg via INTRAVENOUS
  Filled 2020-02-25: qty 2

## 2020-02-25 MED ORDER — FERROUS SULFATE 325 (65 FE) MG PO TABS
325.0000 mg | ORAL_TABLET | Freq: Two times a day (BID) | ORAL | 1 refills | Status: DC
Start: 2020-02-25 — End: 2024-02-01

## 2020-02-25 NOTE — Discharge Summary (Addendum)
Discharge Summary    Ward ID: Sophia Ward,  MRN: [id number], DOB/AGE: 1932-06-12 86 y.o.  Admit date: 02/21/2020 Discharge date: 02/25/2020  Primary Care Provider: [name] Primary Cardiologist: Dr. Irish Lack  Discharge Diagnoses    Principal Problem:   Angina pectoris [facility]) Active Problems:   Mixed hyperlipidemia   Essential hypertension, benign   Anemia   Severe aortic stenosis   Allergies Allergies  Allergen Reactions  . Codeine Nausea And Vomiting    MAKES HER SICK  . Glimepiride Other (See Comments)    Can't remember  . Jardiance [Empagliflozin] Other (See Comments)    Yeast  . Metformin And Related Diarrhea  . Penicillins Rash    Diagnostic Studies/Procedures    Cath: 02/21/20   Ost RCA to Prox RCA lesion is 75% stenosed.  A drug-eluting stent was successfully placed using a SYNERGY XD 3.50X16, postdilated to > 4 mm.  Post intervention, there is a 0% residual stenosis.  Dist LM to Ost LAD lesion is 25% stenosed. Ost LAD to Prox LAD lesion is 75% stenosed.  After orbital atherectomy, a drug-eluting stent was successfully placed using a SYNERGY XD 3.0X24, postdilated to 3.4 in Sophia mid vessel and > 4 mm in Sophia proximal/ostial LAD and left main. Stent optimized with IVUS.  Post intervention, there is a 0% residual stenosis.  Ost Cx to Prox Cx lesion is 75% stenosed, after being jailed by Sophia stent.  Balloon angioplasty was performed using a BALLOON SAPPHIRE 2.0X12.  Post intervention, there is a 10% residual stenosis.  Successful PCI of Sophia ostial RCA, followed by Sophia left main/LADContinue dual antiplatelet therapy. Struts to circumflex opened with small balloon as well since Sophia circumflex was jailed.  Continue DAPT. Will hold Eliquis for now.   Plan for TAVR w/u.   Diagnostic Dominance: Right  Intervention    _____________   History of Present Illness     Sophia Ward is a 86 y.o. female with a hx of rheumatoid  arthritis on long term MTX and prednisone, obesity, CAD s/p acute MI with PCI to LAD (2004), HTN, IDDM, HLD, PAF on Eliquis and spinal stenosis with chronic back pain who is followed by Dr. Irish Lack as an outpatient. She is married and lives in [place]. She is mostly limited by chronic arthritis and back pain as well as shortness of breath. She lives a sedentary lifestyle and is not very active  Sophia Ward's cardiac history dates back to 2004 when she presented with an acute MI.  She was treated with PCI and stenting of Sophia LAD.  Sophia Ward had early in-stent restenosis and underwent repeat PCI and stenting around 5 months later.  She has been followed by Dr. Irish Lack and noted to have at least moderate aortic stenosis by echo in 2019: EF 60%, mean gradient of 22 mm Hg.   She was admitted in May 2021 with acute blood loss anemia with a hemoglobin down to 7.9.  She underwent EGD and colonoscopy which were unremarkable except for a few ulcers in Sophia terminal ileum.  During this admission she was also noted to go into paroxysmal atrial fibrillation.   This was a new diagnosis for her.  She was started on Eliquis and Plavix was discontinued.  2D echo during this admission revealed normal LV function with progression of her aortic stenosis to severe: mean gradient 32 mmHg, peak gradient 56.9 mm hg, AVA 0.80 cm2, DVI 0.24, SVI 31. Plans were made to proceed with  L/RHC.   She was then readmitted in 12/2019 for UTI and acute hypoxic respiratory failure due to left lower lobe pneumonia and acute decompensated diastolic heart failure. Covid test was negative.  Sophia Ward eventually underwent left and right heart catheterization on 01/29/2020 with Dr. Beau Fanny.  This revealed severe proximal two-vessel CAD with ostial stenosis of Sophia RCA and 70% proximal stenosis of Sophia LAD prior to widely patent previously placed stents.  There was nonobstructive disease in Sophia left circumflex. Mean transvalvular gradient  across Sophia aortic valve was measured 16.8 mmHg on pullback corresponding to aortic valve area calculated 1.59 cm. Pulmonary artery pressures were moderately elevated. No intervention was performed at that time and she was referred to Dr. Roxy Manns for cardiothoracic surgery consultation. She underwent gardiac gated CTA of Sophia heart which revealed anatomical characteristics consistent with aortic stenosis suitable for treatment by transcatheter aortic valve replacement without any significant complicating features, although Sophia size of Sophia Ward's aortic root and aortic annulus was relatively small and there was significant calcification noted in Sophia sinotubular junction and Sophia aortic root. Gated CTA of Sophia aorta and iliac vessels demonstrated what appear to be adequate pelvic vascular access to facilitate a transfemoral approach.  She was seen by Dr. Roxy Manns in [facility] office on 02/13/20. Given patients advanced age and multiple comorbidities, it was decided to proceed with staged PCI and stenting followed by TAVR as a less than base of approach to her treatment.    Hospital Course     Consultants: TAVR team (Dr. Burt Knack)  1. CAD: underwent cardiac cath noted above with successful PCI/DES of ostial RCA, along with DES x1 to LM into Sophia LAD. Placed on DAPT with ASA/plavix post cath. No chest pain. Ambulated with cardiac rehab without chest pain -- on ASA, statin, plavix and BB  2. Severe AS: seen by Dr. Burt Knack with plans for TAVR 10/5.   3. Anemia: Hgb baseline appears around 8.5. Hgb has trended low over Sophia weekend post cath.  On PO iron. Transfused one unit with hgb increasing to 9. Shortness of breath significantly improved with increase in H/H  4. PAF: her Eliquis has been on hold since cath. Will continue to hold until after TAVR given close timing. Remained in SR throughout admission  5. HL: on atorvastatin 40mg  daily  6. DM: home insulin dosing resumed at discharge  7. HTN: stable,  continued on Toprol XL 100mg  daily, Altace 10mg  daily and Norvasc 10mg  daily _____________  Discharge Vitals Blood pressure (!) 131/50, pulse 74, temperature 98.7 F (37.1 C), temperature source Oral, resp. rate 20, height 5' 2.5" (1.588 m), weight 77.2 kg, SpO2 96 %.  Filed Weights   02/22/20 0435 02/23/20 0507 02/24/20 0427  Weight: 77.2 kg 77.2 kg 77.2 kg    Labs & Radiologic Studies    CBC Recent Labs    02/24/20 0129 02/25/20 0223  WBC 12.3* 13.2*  HGB 7.2* 9.0*  HCT 24.4* 29.6*  MCV 89.7 87.1  PLT 350 063   Basic Metabolic Panel Recent Labs    02/24/20 0129  NA 138  K 3.8  CL 108  CO2 20*  GLUCOSE 89  BUN 13  CREATININE 1.00  CALCIUM 8.7*   Liver Function Tests No results for input(s): AST, ALT, ALKPHOS, BILITOT, PROT, ALBUMIN in Sophia last 72 hours. No results for input(s): LIPASE, AMYLASE in Sophia last 72 hours. Cardiac Enzymes No results for input(s): CKTOTAL, CKMB, CKMBINDEX, TROPONINI in Sophia last 72 hours. BNP Invalid input(s): POCBNP  D-Dimer No results for input(s): DDIMER in Sophia last 72 hours. Hemoglobin A1C No results for input(s): HGBA1C in Sophia last 72 hours. Fasting Lipid Panel Recent Labs    02/25/20 0223  CHOL 121  HDL 49  LDLCALC 57  TRIG 76  CHOLHDL 2.5   Thyroid Function Tests No results for input(s): TSH, T4TOTAL, T3FREE, THYROIDAB in Sophia last 72 hours.  Invalid input(s): FREET3 _____________  DG Chest 2 View  Result Date: 02/07/2020 CLINICAL DATA:  Recent pneumonia EXAM: CHEST - 2 VIEW COMPARISON:  Chest radiograph January 20, 2020 and chest CT angiogram February 07, 2020. FINDINGS: There is mild interstitial edema in Sophia lower lung regions. No edema or airspace opacity. Small pleural effusions appreciable on CT are not evident by radiography. Sophia heart is borderline enlarged with pulmonary vascularity normal. No adenopathy. There is aortic atherosclerosis. No bone lesions. IMPRESSION: Borderline cardiomegaly with mild bibasilar  interstitial edema. There may be a degree of congestive heart failure. No consolidation. Pleural effusions seen on CT are not appreciable by radiography. Aortic Atherosclerosis (ICD10-I70.0). Electronically Signed   By: Lowella Grip III M.D.   On: 02/07/2020 16:33   CARDIAC CATHETERIZATION  Addendum Date: 02/21/2020    Ost RCA to Prox RCA lesion is 75% stenosed.  A drug-eluting stent was successfully placed using a SYNERGY XD 3.50X16, postdilated to > 4 mm.  Post intervention, there is a 0% residual stenosis.  Dist LM to Ost LAD lesion is 25% stenosed. Ost LAD to Prox LAD lesion is 75% stenosed.  After orbital atherectomy, a drug-eluting stent was successfully placed using a SYNERGY XD 3.0X24, postdilated to 3.4 in Sophia mid vessel and > 4 mm in Sophia proximal/ostial LAD and left main. Stent optimized with IVUS.  Post intervention, there is a 0% residual stenosis.  Ost Cx to Prox Cx lesion is 75% stenosed, after being jailed by Sophia stent.  Balloon angioplasty was performed using a BALLOON SAPPHIRE 2.0X12.  Post intervention, there is a 10% residual stenosis.  Successful PCI of Sophia ostial RCA, followed by Sophia left main/LADContinue dual antiplatelet therapy.  Struts to circumflex opened with small balloon as well since Sophia circumflex was jailed. Continue DAPT.  Will hold Eliquis for now.  Plan for TAVR w/u.   Result Date: 02/21/2020  Ost RCA to Prox RCA lesion is 75% stenosed.  A drug-eluting stent was successfully placed using a SYNERGY XD 3.50X16, postdilated to > 4 mm.  Post intervention, there is a 0% residual stenosis.  Dist LM to Ost LAD lesion is 25% stenosed. Ost LAD to Prox LAD lesion is 75% stenosed.  After orbital atherectomy, a drug-eluting stent was successfully placed using a SYNERGY XD 3.0X24, postdilated to 3.4 in Sophia mid vessel and > 4 mm in Sophia proximal/ostial LAD and left main.  Post intervention, there is a 0% residual stenosis.  Ost Cx to Prox Cx lesion is 75% stenosed,  after being jailed by Sophia stent.  Balloon angioplasty was performed using a BALLOON SAPPHIRE 2.0X12.  Post intervention, there is a 10% residual stenosis.  Successful PCI of Sophia ostial RCA, followed by Sophia left main/LADContinue dual antiplatelet therapy.  Struts to circumflex opened with small balloon as well since Sophia circumflex was jailed. Continue DAPT.  Will hold Eliquis for now.  Plan for TAVR w/u.   CARDIAC CATHETERIZATION  Addendum Date: 01/29/2020    Ost LAD to Prox LAD lesion is 70% stenosed. Significant by DFR, 0.74.  Distal area of Previously placed Mid LAD stent (  unknown type) is widely patent.  Dist LM to Ost LAD lesion is 25% stenosed.  Ost RCA to Prox RCA lesion is 75% stenosed.  LV end diastolic pressure is mildly elevated.  Hemodynamic findings consistent with moderate pulmonary hypertension.  Ao 90%, PA sat 62%, CO 6.9 L/min; CI 3.88; PA pressure 58/16, mean PA pressure 37 mm Hg; mean PCWP 22- significant V waves noted on wedge tracing  Severe proximal, two vessel disease.  Ostial RCA is favorable for PCI.  Proximal LAD would be more technically challenging. Restrictive physiology likely given V waves. Plan for surgical consult to decide about CABG/AVR vs PCI/TAVR. Case d/w Dr. Burt Knack as well.   Result Date: 01/29/2020  Ost LAD to Prox LAD lesion is 70% stenosed.  Previously placed Mid LAD stent (unknown type) is widely patent.  Prox LAD lesion is 70% stenosed.  Dist LM to Ost LAD lesion is 25% stenosed.  Ost RCA to Prox RCA lesion is 75% stenosed.  LV end diastolic pressure is mildly elevated.  Hemodynamic findings consistent with moderate pulmonary hypertension.  Ao 90%, PA sat 62%, CO 6.9 L/min; CI 3.88; PA pressure 58/16, mean PA pressure 37 mm Hg; mean PCWP 22- significant V waves noted on wedge tracing  Severe proximal, two vessel disease.  Ostial RCA is favorable for PCI.  Proximal LAD would be more technically challenging. Restrictive physiology likely given V waves.  Plan for surgical consult to decide about CABG/AVR vs PCI/TAVR. Case d/w Dr. Burt Knack as well.   CT CORONARY MORPH W/CTA COR W/SCORE W/CA W/CM &/OR WO/CM  Addendum Date: 02/07/2020   ADDENDUM REPORT: 02/07/2020 13:17 CLINICAL DATA:  Aortic stenosis EXAM: Cardiac TAVR CT TECHNIQUE: Sophia Ward was scanned on a Siemens Force 102 slice scanner. A 120 kV retrospective scan was triggered in Sophia descending thoracic aorta at 111 HU's. Gantry rotation speed was 270 msecs and collimation was .9 mm. No beta blockade or nitro were given. Sophia 3D data set was reconstructed in 5% intervals of Sophia R-R cycle. Systolic and diastolic phases were analyzed on a dedicated work station using MPR, MIP and VRT modes. Sophia Ward received 80 cc of contrast. FINDINGS: Aortic Valve: Tri leaflet calcified with restricted leaflet motion Calcium score 1211 Aorta: No aneurysm, normal arch vessels moderate calcific atherosclerosis Sinotubular Junction: 25 mm with moderate calcification Ascending Thoracic Aorta: 28 mm Aortic Arch: 24 mm Descending Thoracic Aorta: 24 mm Sinus of Valsalva Measurements: Non-coronary: 27.3 mm Right - coronary: 25.3 mm Left - coronary: 26.7 mm Coronary Artery Height above Annulus: Left Main: 11 mm above annulus Right Coronary: 12.4 mm above annulus Virtual Basal Annulus Measurements: Maximum/Minimum Diameter: 23.7 mm x 17.8 mm Perimeter: 68.6 mm Area: 341 mm 2 Coronary Arteries: Sufficient height above annulus for deployment Optimum Fluoroscopic Angle for Delivery: LAO 22 Caudal 6 degrees IMPRESSION: 1. Calcified tri leaflet AV with annular area of 341 mm2 lower end for 23 mm Sapien 3 valve alternatively would consider a 26 mm Evolut Pro valve 2.  Coronary arteries sufficient height for deployment 3. Optimum angiographic angle for deployment LAO 22 Caudal 6 degrees 4.  Normal aortic root 2.8 cm Jenkins Rouge Electronically Signed   By: Jenkins Rouge M.D.   On: 02/07/2020 13:17   Result Date: 02/07/2020 EXAM:  OVER-READ INTERPRETATION  CT CHEST Sophia following report is an over-read performed by radiologist Dr. Vinnie Langton of [facility], [place] on 02/07/2020. This over-read does not include interpretation of cardiac or coronary anatomy or pathology. Sophia coronary calcium  score/coronary CTA interpretation by Sophia cardiologist is attached. COMPARISON:  None. FINDINGS: Extracardiac findings will be described separately under dictation for contemporaneously obtained CTA chest, abdomen and pelvis. IMPRESSION: Please see separate dictation for contemporaneously obtained CTA chest, abdomen and pelvis dated 02/07/2020 for full description of relevant extracardiac findings. Electronically Signed: By: Vinnie Langton M.D. On: 02/07/2020 11:55   CT ANGIO CHEST AORTA W/CM & OR WO/CM  Result Date: 02/07/2020 CLINICAL DATA:  86 year old female with history of severe aortic stenosis. Preprocedural study prior to potential transcatheter aortic valve replacement (TAVR) procedure. EXAM: CT ANGIOGRAPHY CHEST, ABDOMEN AND PELVIS TECHNIQUE: Multidetector CT imaging through Sophia chest, abdomen and pelvis was performed using Sophia standard protocol during bolus administration of intravenous contrast. Multiplanar reconstructed images and MIPs were obtained and reviewed to evaluate Sophia vascular anatomy. CONTRAST:  37mL OMNIPAQUE IOHEXOL 350 MG/ML SOLN COMPARISON:  CT Sophia abdomen and pelvis 10/22/2019. No prior chest CT. FINDINGS: CTA CHEST FINDINGS Cardiovascular: Heart size is mildly enlarged. Trace amount of pericardial fluid and/or thickening, unlikely to be of any hemodynamic significance at this time. No associated pericardial calcification. There is aortic atherosclerosis, as well as atherosclerosis of Sophia great vessels of Sophia mediastinum and Sophia coronary arteries, including calcified atherosclerotic plaque in Sophia left main, left anterior descending, left circumflex and right coronary arteries. Thickening calcification of Sophia aortic  valve. Mediastinum/Lymph Nodes: Multiple prominent borderline enlarged mediastinal and bilateral hilar lymph nodes, nonspecific. Esophagus is unremarkable in appearance. No axillary lymphadenopathy. Lungs/Pleura: A few scattered tiny 1-2 mm pulmonary nodules are noted throughout Sophia periphery of Sophia lungs bilaterally, nonspecific, but statistically likely to represent areas of mucoid impaction within terminal bronchioles. Mild ground-glass attenuation and interlobular septal thickening scattered throughout Sophia lungs bilaterally, likely to reflect a background of mild interstitial pulmonary edema. No confluent consolidative airspace disease. Small bilateral pleural effusions lying dependently. Musculoskeletal/Soft Tissues: Old healed nondisplaced fracture of Sophia inferior aspect of Sophia sternum. There are no aggressive appearing lytic or blastic lesions noted in Sophia visualized portions of Sophia skeleton. CTA ABDOMEN AND PELVIS FINDINGS Hepatobiliary: No suspicious cystic or solid hepatic lesions. Tiny calcified granuloma in Sophia left lobe of Sophia liver incidentally noted. No intra or extrahepatic biliary ductal dilatation. Tiny calcified gallstones lying dependently in Sophia gallbladder. No findings to suggest an acute cholecystitis at this time. Pancreas: In Sophia inferior aspect of Sophia uncinate process of Sophia pancreas there is a 1.1 x 1.0 cm intermediate attenuation (44 HU) lesion (axial image 137 of series 15). No other pancreatic mass. No pancreatic ductal dilatation. No peripancreatic fluid collections or inflammatory changes are noted. Spleen: Unremarkable. Adrenals/Urinary Tract: Bilateral kidneys and right adrenal gland are normal in appearance. 1.1 x 0.9 cm left adrenal nodule, stable compared to Sophia prior CT examination, previously -11 HU (compatible with an adenoma). No hydroureteronephrosis. Urinary bladder is normal in appearance. Stomach/Bowel: Sophia appearance of Sophia stomach is normal. No pathologic dilatation of  small bowel or colon. Numerous colonic diverticulae are noted, without surrounding inflammatory changes to suggest an acute diverticulitis at this time. Status post appendectomy. Vascular/Lymphatic: Aortic atherosclerosis, without evidence of aneurysm or dissection in Sophia abdominal or pelvic vasculature. Vascular findings and measurements pertinent to potential TAVR procedure, as detailed below. No lymphadenopathy noted in Sophia abdomen or pelvis. Reproductive: Status post hysterectomy. Ovaries are not confidently identified may be surgically absent or atrophic. Other: Tiny umbilical hernia containing only omental fat. No significant volume of ascites. No pneumoperitoneum. Musculoskeletal: There are no aggressive appearing lytic or blastic lesions  noted in Sophia visualized portions of Sophia skeleton. VASCULAR MEASUREMENTS PERTINENT TO TAVR: AORTA: Minimal Aortic Diameter-12 x 11 mm Severity of Aortic Calcification-severe RIGHT PELVIS: Right Common Iliac Artery - Minimal Diameter-8.0 x 8.1 mm Tortuosity-mild Calcification-moderate Right External Iliac Artery - Minimal Diameter-6.9 x 6.5 mm Tortuosity-moderate Calcification-none Right Common Femoral Artery - Minimal Diameter-7.0 x 6.6 mm Tortuosity-mild Calcification-mild LEFT PELVIS: Left Common Iliac Artery - Minimal Diameter-9.9 x 8.8 mm Tortuosity-mild Calcification-mild Left External Iliac Artery - Minimal Diameter-7.7 x 7.1 mm Tortuosity-moderate Calcification-none Left Common Femoral Artery - Minimal Diameter-7.1 x 5.7 mm Tortuosity-mild Calcification-mild Review of Sophia MIP images confirms Sophia above findings. IMPRESSION: 1. Vascular findings and measurements pertinent to potential TAVR procedure, as detailed above. 2. Severe thickening calcification of Sophia aortic valve, compatible with reported clinical history of severe aortic stenosis. 3. Trace amount of pericardial fluid and/or thickening, unlikely to be of any hemodynamic significance at this time. No pericardial  calcification. 4. Mild cardiomegaly with evidence of mild interstitial pulmonary edema and small bilateral pleural effusions; imaging findings that could suggest mild congestive heart failure. 5. Tiny 1-2 mm pulmonary nodules scattered throughout Sophia periphery of Sophia lungs bilaterally, nonspecific, but statistically likely benign. No follow-up needed if Ward is low-risk (and has no known or suspected primary neoplasm). Non-contrast chest CT can be considered in 12 months if Ward is high-risk. This recommendation follows [facility]ia consensus statement: Guidelines for Management of Incidental Pulmonary Nodules Detected on CT Images: From [facility]ia Fleischner Society 2017; Radiology 2017; 284:228-243. 6. 1.1 x 1.0 cm intermediate attenuation lesion in Sophia inferior aspect of Sophia uncinate process of Sophia pancreas. This is nonspecific, but favored to represent a benign lesions such as a small pancreatic pseudocyst. Follow-up evaluation with pancreatic protocol CT scan or abdominal MRI with and without IV gadolinium with MRCP in 2 years is recommended to ensure Sophia stability of this lesion. This recommendation follows ACR consensus guidelines: Management of Incidental Pancreatic Cysts: A White Paper of Sophia ACR Incidental Findings Committee. J Am Coll Radiol 4259;56:387-564. 7. Colonic diverticulosis without evidence of acute diverticulitis at this time. 8. Additional incidental findings, as above. Electronically Signed   By: Vinnie Langton M.D.   On: 02/07/2020 13:56   VAS US CAROTID  Result Date: 02/24/2020 Carotid Arterial Duplex Study Indications:   Pre-surgical evaluation. Risk Factors:  Hypertension, hyperlipidemia, Diabetes, coronary artery disease. Other Factors: Severe aortic stenosis. Performing Technologist: Maudry Mayhew MHA, RDMS, RVT, RDCS  Examination Guidelines: A complete evaluation includes B-mode imaging, spectral Doppler, color Doppler, and power Doppler as needed of all accessible portions of each  vessel. Bilateral testing is considered an integral part of a complete examination. Limited examinations for reoccurring indications may be performed as noted.  Right Carotid Findings: +----------+-------+-------+--------+---------------------------------+--------+           PSV    EDV    StenosisPlaque Description               Comments           cm/s   cm/s                                                     +----------+-------+-------+--------+---------------------------------+--------+ CCA Prox  89     13                                                       +----------+-------+-------+--------+---------------------------------+--------+  CCA Distal90     16             heterogenous, irregular and                                               calcific                                  +----------+-------+-------+--------+---------------------------------+--------+ ICA Prox  117    20             smooth and heterogenous                   +----------+-------+-------+--------+---------------------------------+--------+ ICA Distal129    24                                                       +----------+-------+-------+--------+---------------------------------+--------+ ECA       187                   heterogenous, irregular and                                               calcific                                  +----------+-------+-------+--------+---------------------------------+--------+ +----------+--------+-------+----------------+-------------------+           PSV cm/sEDV cmsDescribe        Arm Pressure (mmHG) +----------+--------+-------+----------------+-------------------+ Subclavian129            Multiphasic, WNL                    +----------+--------+-------+----------------+-------------------+ +---------+--------+--+--------+--+---------+ VertebralPSV cm/s72EDV cm/s15Antegrade  +---------+--------+--+--------+--+---------+  Left Carotid Findings: +----------+--------+--------+--------+---------------------+------------------+           PSV cm/sEDV cm/sStenosisPlaque Description   Comments           +----------+--------+--------+--------+---------------------+------------------+ CCA Prox  105     16                                   intimal thickening +----------+--------+--------+--------+---------------------+------------------+ CCA Distal78      13              smooth and                                                                heterogenous                            +----------+--------+--------+--------+---------------------+------------------+ ICA Prox  67      16              smooth, heterogenous  and calcific                            +----------+--------+--------+--------+---------------------+------------------+ ICA Distal94      23                                                      +----------+--------+--------+--------+---------------------+------------------+ ECA       140                     smooth, calcific and                                                      heterogenous                            +----------+--------+--------+--------+---------------------+------------------+ +----------+--------+--------+----------------+-------------------+           PSV cm/sEDV cm/sDescribe        Arm Pressure (mmHG) +----------+--------+--------+----------------+-------------------+ [id number]             Multiphasic, WNL                    +----------+--------+--------+----------------+-------------------+ +---------+--------+--+--------+--+---------+ VertebralPSV cm/s77EDV cm/s17Antegrade +---------+--------+--+--------+--+---------+   Summary: Right Carotid: Velocities in Sophia right ICA are consistent with a 1-39% stenosis. Left Carotid:  Velocities in Sophia left ICA are consistent with a 1-39% stenosis. Vertebrals:  Bilateral vertebral arteries demonstrate antegrade flow. Subclavians: Normal flow hemodynamics were seen in bilateral subclavian              arteries. *See table(s) above for measurements and observations.     Preliminary    CT Angio Abd/Pel w/ and/or w/o  Result Date: 02/07/2020 CLINICAL DATA:  86 year old female with history of severe aortic stenosis. Preprocedural study prior to potential transcatheter aortic valve replacement (TAVR) procedure. EXAM: CT ANGIOGRAPHY CHEST, ABDOMEN AND PELVIS TECHNIQUE: Multidetector CT imaging through Sophia chest, abdomen and pelvis was performed using Sophia standard protocol during bolus administration of intravenous contrast. Multiplanar reconstructed images and MIPs were obtained and reviewed to evaluate Sophia vascular anatomy. CONTRAST:  79mL OMNIPAQUE IOHEXOL 350 MG/ML SOLN COMPARISON:  CT Sophia abdomen and pelvis 10/22/2019. No prior chest CT. FINDINGS: CTA CHEST FINDINGS Cardiovascular: Heart size is mildly enlarged. Trace amount of pericardial fluid and/or thickening, unlikely to be of any hemodynamic significance at this time. No associated pericardial calcification. There is aortic atherosclerosis, as well as atherosclerosis of Sophia great vessels of Sophia mediastinum and Sophia coronary arteries, including calcified atherosclerotic plaque in Sophia left main, left anterior descending, left circumflex and right coronary arteries. Thickening calcification of Sophia aortic valve. Mediastinum/Lymph Nodes: Multiple prominent borderline enlarged mediastinal and bilateral hilar lymph nodes, nonspecific. Esophagus is unremarkable in appearance. No axillary lymphadenopathy. Lungs/Pleura: A few scattered tiny 1-2 mm pulmonary nodules are noted throughout Sophia periphery of Sophia lungs bilaterally, nonspecific, but statistically likely to represent areas of mucoid impaction within terminal bronchioles. Mild ground-glass  attenuation and interlobular septal thickening scattered throughout Sophia lungs bilaterally, likely to reflect a background of mild interstitial pulmonary edema. No confluent consolidative airspace disease. Small bilateral pleural effusions lying dependently.  Musculoskeletal/Soft Tissues: Old healed nondisplaced fracture of Sophia inferior aspect of Sophia sternum. There are no aggressive appearing lytic or blastic lesions noted in Sophia visualized portions of Sophia skeleton. CTA ABDOMEN AND PELVIS FINDINGS Hepatobiliary: No suspicious cystic or solid hepatic lesions. Tiny calcified granuloma in Sophia left lobe of Sophia liver incidentally noted. No intra or extrahepatic biliary ductal dilatation. Tiny calcified gallstones lying dependently in Sophia gallbladder. No findings to suggest an acute cholecystitis at this time. Pancreas: In Sophia inferior aspect of Sophia uncinate process of Sophia pancreas there is a 1.1 x 1.0 cm intermediate attenuation (44 HU) lesion (axial image 137 of series 15). No other pancreatic mass. No pancreatic ductal dilatation. No peripancreatic fluid collections or inflammatory changes are noted. Spleen: Unremarkable. Adrenals/Urinary Tract: Bilateral kidneys and right adrenal gland are normal in appearance. 1.1 x 0.9 cm left adrenal nodule, stable compared to Sophia prior CT examination, previously -11 HU (compatible with an adenoma). No hydroureteronephrosis. Urinary bladder is normal in appearance. Stomach/Bowel: Sophia appearance of Sophia stomach is normal. No pathologic dilatation of small bowel or colon. Numerous colonic diverticulae are noted, without surrounding inflammatory changes to suggest an acute diverticulitis at this time. Status post appendectomy. Vascular/Lymphatic: Aortic atherosclerosis, without evidence of aneurysm or dissection in Sophia abdominal or pelvic vasculature. Vascular findings and measurements pertinent to potential TAVR procedure, as detailed below. No lymphadenopathy noted in Sophia abdomen or  pelvis. Reproductive: Status post hysterectomy. Ovaries are not confidently identified may be surgically absent or atrophic. Other: Tiny umbilical hernia containing only omental fat. No significant volume of ascites. No pneumoperitoneum. Musculoskeletal: There are no aggressive appearing lytic or blastic lesions noted in Sophia visualized portions of Sophia skeleton. VASCULAR MEASUREMENTS PERTINENT TO TAVR: AORTA: Minimal Aortic Diameter-12 x 11 mm Severity of Aortic Calcification-severe RIGHT PELVIS: Right Common Iliac Artery - Minimal Diameter-8.0 x 8.1 mm Tortuosity-mild Calcification-moderate Right External Iliac Artery - Minimal Diameter-6.9 x 6.5 mm Tortuosity-moderate Calcification-none Right Common Femoral Artery - Minimal Diameter-7.0 x 6.6 mm Tortuosity-mild Calcification-mild LEFT PELVIS: Left Common Iliac Artery - Minimal Diameter-9.9 x 8.8 mm Tortuosity-mild Calcification-mild Left External Iliac Artery - Minimal Diameter-7.7 x 7.1 mm Tortuosity-moderate Calcification-none Left Common Femoral Artery - Minimal Diameter-7.1 x 5.7 mm Tortuosity-mild Calcification-mild Review of Sophia MIP images confirms Sophia above findings. IMPRESSION: 1. Vascular findings and measurements pertinent to potential TAVR procedure, as detailed above. 2. Severe thickening calcification of Sophia aortic valve, compatible with reported clinical history of severe aortic stenosis. 3. Trace amount of pericardial fluid and/or thickening, unlikely to be of any hemodynamic significance at this time. No pericardial calcification. 4. Mild cardiomegaly with evidence of mild interstitial pulmonary edema and small bilateral pleural effusions; imaging findings that could suggest mild congestive heart failure. 5. Tiny 1-2 mm pulmonary nodules scattered throughout Sophia periphery of Sophia lungs bilaterally, nonspecific, but statistically likely benign. No follow-up needed if Ward is low-risk (and has no known or suspected primary neoplasm). Non-contrast  chest CT can be considered in 12 months if Ward is high-risk. This recommendation follows Sophia consensus statement: Guidelines for Management of Incidental Pulmonary Nodules Detected on CT Images: From Sophia Fleischner Society 2017; Radiology 2017; 284:228-243. 6. 1.1 x 1.0 cm intermediate attenuation lesion in Sophia inferior aspect of Sophia uncinate process of Sophia pancreas. This is nonspecific, but favored to represent a benign lesions such as a small pancreatic pseudocyst. Follow-up evaluation with pancreatic protocol CT scan or abdominal MRI with and without IV gadolinium with MRCP in 2 years is recommended to ensure Sophia  stability of this lesion. This recommendation follows ACR consensus guidelines: Management of Incidental Pancreatic Cysts: A White Paper of Sophia ACR Incidental Findings Committee. J Am Coll Radiol 8921;19:417-408. 7. Colonic diverticulosis without evidence of acute diverticulitis at this time. 8. Additional incidental findings, as above. Electronically Signed   By: Vinnie Langton M.D.   On: 02/07/2020 13:56   Disposition   Pt is being discharged home today in good condition.  Follow-up Plans & Appointments     Discharge Instructions    Amb Referral to Cardiac Rehabilitation   Complete by: As directed    Diagnosis:  Coronary Stents PTCA     After initial evaluation and assessments completed: Virtual Based Care may be provided alone or in conjunction with Phase 2 Cardiac Rehab based on Ward barriers.: Yes   Call MD for:  redness, tenderness, or signs of infection (pain, swelling, redness, odor or green/yellow discharge around incision site)   Complete by: As directed    Diet - low sodium heart healthy   Complete by: As directed    Discharge instructions   Complete by: As directed    Radial Site Care Refer to this sheet in Sophia next few weeks. These instructions provide you with information on caring for yourself after your procedure. Your caregiver may also give you more  specific instructions. Your treatment has been planned according to current medical practices, but problems sometimes occur. Call your caregiver if you have any problems or questions after your procedure. HOME CARE INSTRUCTIONS You may shower Sophia day after Sophia procedure.Remove Sophia bandage (dressing) and gently wash Sophia site with plain soap and water.Gently pat Sophia site dry.  Do not apply powder or lotion to Sophia site.  Do not submerge Sophia affected site in water for 3 to 5 days.  Inspect Sophia site at least twice daily.  Do not flex or bend Sophia affected arm for 24 hours.  No lifting over 5 pounds (2.3 kg) for 5 days after your procedure.  Do not drive home if you are discharged Sophia same day of Sophia procedure. Have someone else drive you.  You may drive 24 hours after Sophia procedure unless otherwise instructed by your caregiver.  What to expect: Any bruising will usually fade within 1 to 2 weeks.  Blood that collects in Sophia tissue (hematoma) may be painful to Sophia touch. It should usually decrease in size and tenderness within 1 to 2 weeks.  SEEK IMMEDIATE MEDICAL CARE IF: You have unusual pain at Sophia radial site.  You have redness, warmth, swelling, or pain at Sophia radial site.  You have drainage (other than a small amount of blood on Sophia dressing).  You have chills.  You have a fever or persistent symptoms for more than 72 hours.  You have a fever and your symptoms suddenly get worse.  Your arm becomes pale, cool, tingly, or numb.  You have heavy bleeding from Sophia site. Hold pressure on Sophia site.   PLEASE DO NOT MISS ANY DOSES OF YOUR PLAVIX!!!!! Also keep a log of you blood pressures and bring back to your follow up appt. Please call Sophia office with any questions.   Patients taking blood thinners should generally stay away from medicines like ibuprofen, Advil, Motrin, naproxen, and Aleve due to risk of stomach bleeding. You may take Tylenol as directed or talk to your primary doctor about  alternatives.  Please do not restart your Eliquis until instructed to after your hear valve procedure.   Increase  activity slowly   Complete by: As directed       Discharge Medications   Allergies as of 02/25/2020      Reactions   Codeine Nausea And Vomiting   MAKES HER SICK   Glimepiride Other (See Comments)   Can't remember   Jardiance [empagliflozin] Other (See Comments)   Yeast   Metformin And Related Diarrhea   Penicillins Rash      Medication List    STOP taking these medications   apixaban 5 MG Tabs tablet Commonly known as: ELIQUIS     TAKE these medications   acetaminophen 650 MG CR tablet Commonly known as: TYLENOL Take 1,300 mg by mouth at bedtime.   amLODipine 10 MG tablet Commonly known as: NORVASC Take 1 tablet (10 mg total) by mouth every morning. What changed: when to take this   aspirin 81 MG chewable tablet Chew 1 tablet (81 mg total) by mouth daily. Start taking on: February 26, 2020   atorvastatin 40 MG tablet Commonly known as: LIPITOR Take 1 tablet (40 mg total) by mouth at bedtime.   B-D ULTRAFINE III SHORT PEN 31G X 8 MM Misc Generic drug: Insulin Pen Needle 1 each by Other route in Sophia morning, at noon, in Sophia evening, and at bedtime.   CALCIUM + D PO Take 1 tablet by mouth daily.   clopidogrel 75 MG tablet Commonly known as: PLAVIX Take 4 tablets (300 mg) by mouth this evening, then take 1 tablet (75 mg) by mouth daily   escitalopram 5 MG tablet Commonly known as: LEXAPRO Take 5 mg by mouth daily.   estradiol 0.1 MG/GM vaginal cream Commonly known as: ESTRACE Place 1 Applicatorful vaginally daily as needed (driness).   ferrous sulfate 325 (65 FE) MG tablet Take 1 tablet (325 mg total) by mouth 2 (two) times daily with a meal.   FISH OIL PO Take 1 capsule by mouth daily.   folic acid 1 MG tablet Commonly known as: FOLVITE Take 2 tablets (2 mg total) by mouth daily.   HumaLOG KwikPen 100 UNIT/ML KiwkPen Generic drug:  insulin lispro Inject 6 Units into Sophia skin 3 (three) times daily.   Levemir FlexTouch 100 UNIT/ML FlexPen Generic drug: insulin detemir Inject 14 Units into Sophia skin every morning.   methotrexate 2.5 MG tablet TAKE 5 TABLETS BY MOUTH ONCE A WEEK, CAUTION CHEMOTHERAPY, PROTECT FROM LIGHT What changed:   how much to take  how to take this  when to take this  additional instructions   metoprolol succinate 100 MG 24 hr tablet Commonly known as: TOPROL-XL Take 1 tablet (100 mg total) by mouth every morning. What changed: when to take this   nitroGLYCERIN 0.4 MG SL tablet Commonly known as: NITROSTAT DISSOLVE ONE TABLET UNDER Sophia TONGUE EVERY 5 MINUTES AS NEEDED FOR CHEST PAIN.  DO NOT EXCEED A TOTAL OF 3 DOSES IN 15 MINUTES What changed:   how much to take  how to take this  when to take this  reasons to take this   pantoprazole 40 MG tablet Commonly known as: PROTONIX Take 1 tablet (40 mg total) by mouth daily after lunch.   predniSONE 1 MG tablet Commonly known as: DELTASONE TAKE 4 TABLETS BY MOUTH ONCE DAILY WITH BREAKFAST What changed: See Sophia new instructions.   ramipril 10 MG tablet Commonly known as: ALTACE Take 10 mg by mouth 2 (two) times daily.   Vitamin D3 125 MCG (5000 UT) Caps Take 5,000 Units by mouth daily.  Aspirin prescribed at discharge?  Yes High Intensity Statin Prescribed? (Lipitor 40-80mg  or Crestor 20-40mg ): Yes Beta Blocker Prescribed? Yes For EF <40%, was ACEI/ARB Prescribed? Yes ADP Receptor Inhibitor Prescribed? (i.e. Plavix etc.-Includes Medically Managed Patients): Yes For EF <40%, Aldosterone Inhibitor Prescribed? No: EF ok Was EF assessed during THIS hospitalization? Yes Was Cardiac Rehab II ordered? (Included Medically managed Patients): Yes   Outstanding Labs/Studies   TAVR scheduled for 10/5  Duration of Discharge Encounter   Greater than 30 minutes including physician time.  Signed, Reino Bellis  NP-C 02/25/2020, 9:53 AM    I have examined Sophia Ward and reviewed assessment and plan and discussed with Ward.  Agree with above as stated.    GEN: Well nourished, well developed, in no acute distress  HEENT: normal  Neck: no JVD, carotid bruits, or masses Cardiac: RRR; no murmurs, rubs, or gallops,no edema  Respiratory:  clear to auscultation bilaterally, normal work of breathing GI: soft, nontender, nondistended,  MS: no deformity or atrophy ; bruising on dorsum of right forearm; 2+ right radial pulse Skin: warm and dry, no rash Neuro:  Strength and sensation are intact Psych: euthymic mood, full affect  Feels better after transfusion.  Did not give lasix yesterday since blood was given later in Sophia day.  Plan for 1 dose of Lasix and then discharge.  TAVR planned for next week.  No Eliquis since she has an upcoming procedure.  COntinue DAPT.  Secondary prevention for CAD.     Anemia management with Dr. Irene Limbo.  She is happy to go home.    Larae Grooms

## 2020-02-25 NOTE — Discharge Instructions (Signed)

## 2020-02-25 NOTE — Progress Notes (Signed)
CARDIAC REHAB PHASE I   PRE:  Rate/Rhythm: 72 SR  BP:  Supine: 131/50  Sitting:   Standing:    SaO2: 94%RA  MODE:  Ambulation: 275 ft   POST:  Rate/Rhythm: 81 SR  BP:  Supine:   Sitting: 137/51  Standing:    SaO2: 89-92%RA [id number] Pt walked 275 ft independently with steady gait. Encouraged her to use pursed lip breathing and to stop as needed to catch her breath. Monitored sats whole walk. Occasionally dropped to 89% briefly but quickly to 90-92% with rest and deep breaths. Has IS in room and encouraged pt to use at home. Encouraged pt to take frequent rest stops when walking at home. Encouraged plavix for stent. To recliner with call bell. Does not qualify for home oxygen.   Graylon Good, RN BSN  02/25/2020 8:49 AM

## 2020-02-27 ENCOUNTER — Telehealth (HOSPITAL_COMMUNITY): Payer: Self-pay

## 2020-02-27 NOTE — Telephone Encounter (Signed)
Pt insurance is active and benefits verified through Medicare A/B. Co-pay $0.00, DED $203.00/$203.00 met, out of pocket $0.00/$0.00 met, co-insurance 20%. No pre-authorization required. Passport, 02/27/20 @ 12:28PM, SNG#[id number]  2ndary insurance is active and benefits verified through [employer].Marland Kitchen Co-pay $0.00, DED $0.00/$0.00 met, out of pocket $0.00/$0.00 met, co-insurance 0%. No pre-authorization required. Passport, 02/27/20 @ 12:32PM, XBO#[id number]  Will contact patient to see if she is interested in the Cardiac Rehab Program. If interested, patient will need to complete follow up appt. Once completed, patient will be contacted for scheduling upon review by the RN Navigator.

## 2020-02-27 NOTE — Pre-Procedure Instructions (Signed)
Your procedure is scheduled on Tuesday, October 5th at 7:30a.m.  Report to [facility] Main Entrance "A" at 5:30 A.M., and check in at the Admitting office.  Call this number if you have problems the morning of surgery:  [phone]  Call [phone] if you have any questions prior to your surgery date Monday-Friday 8am-4pm    Remember:  Do not eat or drink after midnight the night before your surgery    Take NO medications the morning of surgery.  As of today, STOP taking any Aspirin (unless otherwise instructed by your surgeon) Aleve, Naproxen, Ibuprofen, Motrin, Advil, Goody's, BC's, all herbal medications, fish oil, and all vitamins.   WHAT DO I DO ABOUT MY DIABETES MEDICATION?   . DO NOT TAKE ANY INSULIN on the day of surgery.   . THE NIGHT BEFORE SURGERY, take _____3______ units of your NIGHT TIME DOSE of insulin lispro (HUMALOG KWIKPEN) insulin.       HOW TO MANAGE YOUR DIABETES BEFORE AND AFTER SURGERY  Why is it important to control my blood sugar before and after surgery? . Improving blood sugar levels before and after surgery helps healing and can limit problems. . A way of improving blood sugar control is eating a healthy diet by: o  Eating less sugar and carbohydrates o  Increasing activity/exercise o  Talking with your doctor about reaching your blood sugar goals . High blood sugars (greater than 180 mg/dL) can raise your risk of infections and slow your recovery, so you will need to focus on controlling your diabetes during the weeks before surgery. . Make sure that the doctor who takes care of your diabetes knows about your planned surgery including the date and location.  How do I manage my blood sugar before surgery? . Check your blood sugar at least 4 times a day, starting 2 days before surgery, to make sure that the level is not too high or low. . Check your blood sugar the morning of your surgery when you wake up and every 2 hours until you get to the  Short Stay unit. o If your blood sugar is less than 70 mg/dL, you will need to treat for low blood sugar: - Do not take insulin. - Treat a low blood sugar (less than 70 mg/dL) with  cup of clear juice (cranberry or apple), 4 glucose tablets, OR glucose gel. - Recheck blood sugar in 15 minutes after treatment (to make sure it is greater than 70 mg/dL). If your blood sugar is not greater than 70 mg/dL on recheck, call [phone] for further instructions. . Report your blood sugar to the short stay nurse when you get to Short Stay.  . If you are admitted to the hospital after surgery: o Your blood sugar will be checked by the staff and you will probably be given insulin after surgery (instead of oral diabetes medicines) to make sure you have good blood sugar levels. o The goal for blood sugar control after surgery is 80-180 mg/dL.                      Do not wear jewelry, make up, or nail polish            Do not wear lotions, powders, perfumes, or deodorant.            Do not shave 48 hours prior to surgery.              Do not bring valuables to  the hospital.            [facility] is not responsible for any belongings or valuables.  Do NOT Smoke (Tobacco/Vaping) or drink Alcohol 24 hours prior to your procedure If you use a CPAP at night, you may bring all equipment for your overnight stay.   Contacts, glasses, dentures or bridgework may not be worn into surgery.      For patients admitted to the hospital, discharge time will be determined by your treatment team.   Patients discharged the day of surgery will not be allowed to drive home, and someone needs to stay with them for 24 hours.    Special instructions:   - Preparing For Surgery  Before surgery, you can play an important role. Because skin is not sterile, your skin needs to be as free of germs as possible. You can reduce the number of germs on your skin by washing with CHG (chlorahexidine gluconate) Soap before  surgery.  CHG is an antiseptic cleaner which kills germs and bonds with the skin to continue killing germs even after washing.    Oral Hygiene is also important to reduce your risk of infection.  Remember - BRUSH YOUR TEETH THE MORNING OF SURGERY WITH YOUR REGULAR TOOTHPASTE  Please do not use if you have an allergy to CHG or antibacterial soaps. If your skin becomes reddened/irritated stop using the CHG.  Do not shave (including legs and underarms) for at least 48 hours prior to first CHG shower. It is OK to shave your face.  Please follow these instructions carefully.   1. Shower the NIGHT BEFORE SURGERY and the MORNING OF SURGERY with CHG Soap.   2. If you chose to wash your hair, wash your hair first as usual with your normal shampoo.  3. After you shampoo, rinse your hair and body thoroughly to remove the shampoo.  4. Use CHG as you would any other liquid soap. You can apply CHG directly to the skin and wash gently with a scrungie or a clean washcloth.   5. Apply the CHG Soap to your body ONLY FROM THE NECK DOWN.  Do not use on open wounds or open sores. Avoid contact with your eyes, ears, mouth and genitals (private parts). Wash Face and genitals (private parts)  with your normal soap.   6. Wash thoroughly, paying special attention to the area where your surgery will be performed.  7. Thoroughly rinse your body with warm water from the neck down.  8. DO NOT shower/wash with your normal soap after using and rinsing off the CHG Soap.  9. Pat yourself dry with a CLEAN TOWEL.  10. Wear CLEAN PAJAMAS to bed the night before surgery  11. Place CLEAN SHEETS on your bed the night of your first shower and DO NOT SLEEP WITH PETS.   Day of Surgery: Wear Clean/Comfortable clothing the morning of surgery Do not apply any deodorants/lotions.   Remember to brush your teeth WITH YOUR REGULAR TOOTHPASTE.   Please read over the following fact sheets that you were given.

## 2020-02-28 ENCOUNTER — Encounter (HOSPITAL_COMMUNITY): Payer: Medicare Other

## 2020-02-28 ENCOUNTER — Encounter (HOSPITAL_COMMUNITY): Payer: Self-pay

## 2020-02-28 ENCOUNTER — Other Ambulatory Visit: Payer: Self-pay

## 2020-02-28 ENCOUNTER — Other Ambulatory Visit (HOSPITAL_COMMUNITY)
Admission: RE | Admit: 2020-02-28 | Discharge: 2020-02-28 | Disposition: A | Discharge status: Home / Self Care | Payer: Medicare Other | Source: Ambulatory Visit | Attending: Cardiovascular Disease | Admitting: Cardiovascular Disease

## 2020-02-28 ENCOUNTER — Ambulatory Visit (HOSPITAL_COMMUNITY)
Admission: RE | Admit: 2020-02-28 | Discharge: 2020-02-28 | Disposition: A | Discharge status: Home / Self Care | Payer: Medicare Other | Source: Ambulatory Visit | Attending: Cardiovascular Disease | Admitting: Cardiovascular Disease

## 2020-02-28 ENCOUNTER — Ambulatory Visit: Payer: Medicare Other | Admitting: Physical Therapy

## 2020-02-28 ENCOUNTER — Other Ambulatory Visit (HOSPITAL_COMMUNITY): Payer: Medicare Other

## 2020-02-28 ENCOUNTER — Encounter (HOSPITAL_COMMUNITY)
Admission: RE | Admit: 2020-02-28 | Discharge: 2020-02-28 | Disposition: A | Discharge status: Home / Self Care | Payer: Medicare Other | Source: Ambulatory Visit | Attending: Cardiovascular Disease | Admitting: Cardiovascular Disease

## 2020-02-28 DIAGNOSIS — I350 Nonrheumatic aortic (valve) stenosis: Secondary | ICD-10-CM

## 2020-02-28 DIAGNOSIS — E119 Type 2 diabetes mellitus without complications: Secondary | ICD-10-CM | POA: Insufficient documentation

## 2020-02-28 DIAGNOSIS — Z20822 Contact with and (suspected) exposure to covid-19: Secondary | ICD-10-CM | POA: Diagnosis not present

## 2020-02-28 DIAGNOSIS — I517 Cardiomegaly: Secondary | ICD-10-CM | POA: Insufficient documentation

## 2020-02-28 DIAGNOSIS — Z01812 Encounter for preprocedural laboratory examination: Secondary | ICD-10-CM | POA: Diagnosis not present

## 2020-02-28 DIAGNOSIS — J90 Pleural effusion, not elsewhere classified: Secondary | ICD-10-CM | POA: Insufficient documentation

## 2020-02-28 DIAGNOSIS — Z01818 Encounter for other preprocedural examination: Secondary | ICD-10-CM | POA: Diagnosis not present

## 2020-02-28 LAB — BLOOD GAS, ARTERIAL
Acid-base deficit: 2.4 mmol/L — ABNORMAL HIGH (ref 0.0–2.0)
Bicarbonate: 20.9 mmol/L (ref 20.0–28.0)
FIO2: 21
O2 Saturation: 97.3 %
Patient temperature: 37
pCO2 arterial: 30.3 mmHg — ABNORMAL LOW (ref 32.0–48.0)
pH, Arterial: 7.454 — ABNORMAL HIGH (ref 7.350–7.450)
pO2, Arterial: 87.6 mmHg (ref 83.0–108.0)

## 2020-02-28 LAB — URINALYSIS, ROUTINE W REFLEX MICROSCOPIC
Bilirubin Urine: NEGATIVE
Glucose, UA: NEGATIVE mg/dL
Hgb urine dipstick: NEGATIVE
Ketones, ur: NEGATIVE mg/dL
Leukocytes,Ua: NEGATIVE
Nitrite: NEGATIVE
Protein, ur: NEGATIVE mg/dL
Specific Gravity, Urine: 1.019 (ref 1.005–1.030)
pH: 5 (ref 5.0–8.0)

## 2020-02-28 LAB — BRAIN NATRIURETIC PEPTIDE: B Natriuretic Peptide: 427.7 pg/mL — ABNORMAL HIGH (ref 0.0–100.0)

## 2020-02-28 LAB — HEMOGLOBIN A1C
Hgb A1c MFr Bld: 5.3 % (ref 4.8–5.6)
Mean Plasma Glucose: 105.41 mg/dL

## 2020-02-28 LAB — CBC
HCT: 33.6 % — ABNORMAL LOW (ref 36.0–46.0)
Hemoglobin: 9.9 g/dL — ABNORMAL LOW (ref 12.0–15.0)
MCH: 26.5 pg (ref 26.0–34.0)
MCHC: 29.5 g/dL — ABNORMAL LOW (ref 30.0–36.0)
MCV: 90.1 fL (ref 80.0–100.0)
Platelets: 417 10*3/uL — ABNORMAL HIGH (ref 150–400)
RBC: 3.73 MIL/uL — ABNORMAL LOW (ref 3.87–5.11)
RDW: 18.3 % — ABNORMAL HIGH (ref 11.5–15.5)
WBC: 11.4 10*3/uL — ABNORMAL HIGH (ref 4.0–10.5)
nRBC: 0 % (ref 0.0–0.2)

## 2020-02-28 LAB — COMPREHENSIVE METABOLIC PANEL
ALT: 13 U/L (ref 0–44)
AST: 18 U/L (ref 15–41)
Albumin: 3.5 g/dL (ref 3.5–5.0)
Alkaline Phosphatase: 43 U/L (ref 38–126)
Anion gap: 11 (ref 5–15)
BUN: 14 mg/dL (ref 8–23)
CO2: 20 mmol/L — ABNORMAL LOW (ref 22–32)
Calcium: 9.3 mg/dL (ref 8.9–10.3)
Chloride: 108 mmol/L (ref 98–111)
Creatinine, Ser: 0.99 mg/dL (ref 0.44–1.00)
GFR calc Af Amer: >60 mL/min (ref >60)
GFR calc non Af Amer: 52 mL/min — ABNORMAL LOW (ref >60)
Glucose, Bld: 146 mg/dL — ABNORMAL HIGH (ref 70–99)
Potassium: 3.8 mmol/L (ref 3.5–5.1)
Sodium: 139 mmol/L (ref 135–145)
Total Bilirubin: 0.8 mg/dL (ref 0.3–1.2)
Total Protein: 6.7 g/dL (ref 6.5–8.1)

## 2020-02-28 LAB — GLUCOSE, CAPILLARY: Glucose-Capillary: 137 mg/dL — ABNORMAL HIGH (ref 70–99)

## 2020-02-28 LAB — SARS CORONAVIRUS 2 (TAT 6-24 HRS): SARS Coronavirus 2: NEGATIVE

## 2020-02-28 LAB — APTT: aPTT: 31 seconds (ref 24–36)

## 2020-02-28 LAB — PROTIME-INR
INR: 1 (ref 0.8–1.2)
Prothrombin Time: 13 seconds (ref 11.4–15.2)

## 2020-02-28 LAB — SURGICAL PCR SCREEN
MRSA, PCR: NEGATIVE
Staphylococcus aureus: NEGATIVE

## 2020-02-28 NOTE — Progress Notes (Signed)
PCP - Dr. [name] Cardiologist - Dr. Lendell Caprice  Chest x-ray - 02/28/20 EKG - 02/22/20 Stress Test - 2007 ECHO - 10/24/19 Cardiac Cath - 02/21/20  Sleep Study - denies CPAP - denies  Fasting Blood Sugar - 120-130 Checks Blood Sugar __3__ times a day CBG at PAT: 137 Last A1C: 5.7 on 01/13/20 Instructed to only take 1/2 dose of night time dose of humalog the night before surgery. No insulin the DOS.  Blood Thinner Instructions:Pt no longer taking Eliquis. Plavix to be continued until DOS. None of DOS Aspirin Instructions:Continue up until DOS. None on DOS   COVID TEST- 02/28/20. Pt aware of quarantine guidelines   Anesthesia review: Yes, heart history.  Patient denies shortness of breath, fever, cough and chest pain at PAT appointment   All instructions explained to the patient, with a verbal understanding of the material. Patient agrees to go over the instructions while at home for a better understanding. Patient also instructed to self quarantine after being tested for COVID-19. The opportunity to ask questions was provided.    Coronavirus Screening  Have you experienced the following symptoms:  Cough yes/no: No Fever (>100.46F)  yes/no: No Runny nose yes/no: No Sore throat yes/no: No Difficulty breathing/shortness of breath  yes/no: No  Have you or a family member traveled in the last 14 days and where? yes/no: No   If the patient indicates "YES" to the above questions, their PAT will be rescheduled to limit the exposure to others and, the surgeon will be notified. THE PATIENT WILL NEED TO BE ASYMPTOMATIC FOR 14 DAYS.   If the patient is not experiencing any of these symptoms, the PAT nurse will instruct them to NOT bring anyone with them to their appointment since they may have these symptoms or traveled as well.   Please remind your patients and families that hospital visitation restrictions are in effect and the importance of the restrictions.

## 2020-03-02 MED ORDER — DEXMEDETOMIDINE HCL IN NACL 400 MCG/100ML IV SOLN
0.1000 ug/kg/h | INTRAVENOUS | Status: AC
  Administered 2020-03-03: .8 ug/kg/h via INTRAVENOUS
  Filled 2020-03-02: qty 100

## 2020-03-02 MED ORDER — VANCOMYCIN HCL 1250 MG/250ML IV SOLN
1250.0000 mg | INTRAVENOUS | Status: AC
  Administered 2020-03-03: 1250 mg via INTRAVENOUS
  Filled 2020-03-02 (×2): qty 250

## 2020-03-02 MED ORDER — SODIUM CHLORIDE 0.9 % IV SOLN
INTRAVENOUS | Status: DC
  Filled 2020-03-02: qty 30

## 2020-03-02 MED ORDER — NOREPINEPHRINE 4 MG/250ML-% IV SOLN
0.0000 ug/min | INTRAVENOUS | Status: AC
  Administered 2020-03-03: 1 ug/min via INTRAVENOUS
  Filled 2020-03-02: qty 250

## 2020-03-02 MED ORDER — MAGNESIUM SULFATE 50 % IJ SOLN
40.0000 meq | INTRAMUSCULAR | Status: DC
  Filled 2020-03-02: qty 9.85

## 2020-03-02 MED ORDER — LEVOFLOXACIN IN D5W 500 MG/100ML IV SOLN
500.0000 mg | INTRAVENOUS | Status: AC
  Administered 2020-03-03: 500 mg via INTRAVENOUS
  Filled 2020-03-02 (×2): qty 100

## 2020-03-02 MED ORDER — POTASSIUM CHLORIDE 2 MEQ/ML IV SOLN
80.0000 meq | INTRAVENOUS | Status: DC
  Filled 2020-03-02: qty 40

## 2020-03-02 NOTE — Anesthesia Preprocedure Evaluation (Addendum)
Anesthesia Evaluation  Patient identified by MRN, date of birth, ID band Patient awake    Reviewed: Allergy & Precautions, NPO status , Patient's Chart, lab work & pertinent test results, reviewed documented beta blocker date and time   History of Anesthesia Complications Negative for: history of anesthetic complications  Airway Mallampati: IV  TM Distance: >3 FB Neck ROM: Full    Dental  (+) Dental Advisory Given   Pulmonary neg pulmonary ROS,    Pulmonary exam normal        Cardiovascular hypertension, Pt. on medications and Pt. on home beta blockers + CAD, + Past MI and + Cardiac Stents  Normal cardiovascular exam+ Valvular Problems/Murmurs AS    '21 Carotid US - 1-39% b/l ICAS  '21 Cath/Stent - Ost RCA to Prox RCA lesion is 75% stenosed. A drug-eluting stent was successfully placed using a SYNERGY XD 3.50X16, postdilated to > 4 mm. Post intervention, there is a 0% residual stenosis. Dist LM to Ost LAD lesion is 25% stenosed. Ost LAD to Prox LAD lesion is 75% stenosed. After orbital atherectomy, a drug-eluting stent was successfully placed using a SYNERGY XD 3.0X24, postdilated to 3.4 in the mid vessel and > 4 mm in the proximal/ostial LAD and left main. Stent optimized with IVUS. Post intervention, there is a 0% residual stenosis. Ost Cx to Prox Cx lesion is 75% stenosed, after being jailed by the stent. Balloon angioplasty was performed using a BALLOON SAPPHIRE 2.0X12. Post intervention, there is a 10% residual stenosis.  '21 TTE - EF 60 to 65%. There is moderately elevated pulmonary artery systolic pressure. LA was mildly dilated. Trivial MR and AI. Severe AS. Aortic valve area, by VTI measures 0.76 cm. Aortic valve mean gradient measures 32.0 mmHg. Aortic valve Vmax measures  3.77 m/s.     Neuro/Psych  BPPV  negative psych ROS   GI/Hepatic Neg liver ROS, GERD  Medicated and Controlled,  Endo/Other   diabetes, Type 2, Insulin Dependent Obesity   Renal/GU negative Renal ROS     Musculoskeletal  (+) Arthritis , Rheumatoid disorders,    Abdominal   Peds  Hematology  (+) anemia ,  On plavix    Anesthesia Other Findings Covid test negative   Reproductive/Obstetrics                            Anesthesia Physical Anesthesia Plan  ASA: IV  Anesthesia Plan: MAC   Post-op Pain Management:    Induction: Intravenous  PONV Risk Score and Plan: 2 and Propofol infusion and Treatment may vary due to age or medical condition  Airway Management Planned: Natural Airway and Simple Face Mask  Additional Equipment: Arterial line  Intra-op Plan:   Post-operative Plan:   Informed Consent: I have reviewed the patients History and Physical, chart, labs and discussed the procedure including the risks, benefits and alternatives for the proposed anesthesia with the patient or authorized representative who has indicated his/her understanding and acceptance.       Plan Discussed with: CRNA and Anesthesiologist  Anesthesia Plan Comments:        Anesthesia Quick Evaluation

## 2020-03-03 ENCOUNTER — Inpatient Hospital Stay (HOSPITAL_COMMUNITY): Payer: Medicare Other

## 2020-03-03 ENCOUNTER — Inpatient Hospital Stay (HOSPITAL_COMMUNITY): Payer: Medicare Other | Admitting: Certified Registered Nurse Anesthetist

## 2020-03-03 ENCOUNTER — Inpatient Hospital Stay (HOSPITAL_COMMUNITY)
Admission: RE | Admit: 2020-03-03 | Discharge: 2020-03-04 | DRG: 266 | Disposition: A | Discharge status: Home / Self Care | Payer: Medicare Other | Attending: Cardiovascular Disease | Admitting: Cardiovascular Disease

## 2020-03-03 ENCOUNTER — Other Ambulatory Visit: Payer: Self-pay

## 2020-03-03 ENCOUNTER — Other Ambulatory Visit (HOSPITAL_COMMUNITY): Payer: Medicare Other

## 2020-03-03 ENCOUNTER — Encounter (HOSPITAL_COMMUNITY): Payer: Self-pay | Admitting: Cardiovascular Disease

## 2020-03-03 ENCOUNTER — Encounter (HOSPITAL_COMMUNITY)
Admission: RE | Disposition: A | Discharge status: Home / Self Care | Payer: Self-pay | Source: Home / Self Care | Attending: Cardiovascular Disease

## 2020-03-03 ENCOUNTER — Other Ambulatory Visit: Payer: Self-pay | Admitting: Physician Assistant

## 2020-03-03 DIAGNOSIS — Z885 Allergy status to narcotic agent status: Secondary | ICD-10-CM

## 2020-03-03 DIAGNOSIS — E669 Obesity, unspecified: Secondary | ICD-10-CM | POA: Diagnosis present

## 2020-03-03 DIAGNOSIS — I350 Nonrheumatic aortic (valve) stenosis: Secondary | ICD-10-CM | POA: Diagnosis not present

## 2020-03-03 DIAGNOSIS — M353 Polymyalgia rheumatica: Secondary | ICD-10-CM | POA: Diagnosis present

## 2020-03-03 DIAGNOSIS — Z8249 Family history of ischemic heart disease and other diseases of the circulatory system: Secondary | ICD-10-CM | POA: Diagnosis not present

## 2020-03-03 DIAGNOSIS — M0579 Rheumatoid arthritis with rheumatoid factor of multiple sites without organ or systems involvement: Secondary | ICD-10-CM | POA: Diagnosis present

## 2020-03-03 DIAGNOSIS — I10 Essential (primary) hypertension: Secondary | ICD-10-CM | POA: Diagnosis present

## 2020-03-03 DIAGNOSIS — Z006 Encounter for examination for normal comparison and control in clinical research program: Secondary | ICD-10-CM

## 2020-03-03 DIAGNOSIS — E119 Type 2 diabetes mellitus without complications: Secondary | ICD-10-CM

## 2020-03-03 DIAGNOSIS — D509 Iron deficiency anemia, unspecified: Secondary | ICD-10-CM | POA: Diagnosis not present

## 2020-03-03 DIAGNOSIS — E1136 Type 2 diabetes mellitus with diabetic cataract: Secondary | ICD-10-CM | POA: Diagnosis present

## 2020-03-03 DIAGNOSIS — Z6831 Body mass index (BMI) 31.0-31.9, adult: Secondary | ICD-10-CM

## 2020-03-03 DIAGNOSIS — I252 Old myocardial infarction: Secondary | ICD-10-CM | POA: Diagnosis not present

## 2020-03-03 DIAGNOSIS — Z955 Presence of coronary angioplasty implant and graft: Secondary | ICD-10-CM

## 2020-03-03 DIAGNOSIS — I480 Paroxysmal atrial fibrillation: Secondary | ICD-10-CM | POA: Diagnosis not present

## 2020-03-03 DIAGNOSIS — I5033 Acute on chronic diastolic (congestive) heart failure: Secondary | ICD-10-CM | POA: Diagnosis not present

## 2020-03-03 DIAGNOSIS — Z8719 Personal history of other diseases of the digestive system: Secondary | ICD-10-CM

## 2020-03-03 DIAGNOSIS — M8580 Other specified disorders of bone density and structure, unspecified site: Secondary | ICD-10-CM | POA: Diagnosis present

## 2020-03-03 DIAGNOSIS — I2510 Atherosclerotic heart disease of native coronary artery without angina pectoris: Secondary | ICD-10-CM | POA: Diagnosis present

## 2020-03-03 DIAGNOSIS — K219 Gastro-esophageal reflux disease without esophagitis: Secondary | ICD-10-CM | POA: Diagnosis not present

## 2020-03-03 DIAGNOSIS — Z952 Presence of prosthetic heart valve: Secondary | ICD-10-CM | POA: Diagnosis not present

## 2020-03-03 DIAGNOSIS — Z20822 Contact with and (suspected) exposure to covid-19: Secondary | ICD-10-CM | POA: Diagnosis present

## 2020-03-03 DIAGNOSIS — K863 Pseudocyst of pancreas: Secondary | ICD-10-CM | POA: Diagnosis present

## 2020-03-03 DIAGNOSIS — Z23 Encounter for immunization: Secondary | ICD-10-CM | POA: Diagnosis not present

## 2020-03-03 DIAGNOSIS — M1A9XX0 Chronic gout, unspecified, without tophus (tophi): Secondary | ICD-10-CM | POA: Diagnosis not present

## 2020-03-03 DIAGNOSIS — M170 Bilateral primary osteoarthritis of knee: Secondary | ICD-10-CM | POA: Diagnosis present

## 2020-03-03 DIAGNOSIS — G8929 Other chronic pain: Secondary | ICD-10-CM | POA: Diagnosis present

## 2020-03-03 DIAGNOSIS — Z794 Long term (current) use of insulin: Secondary | ICD-10-CM | POA: Diagnosis not present

## 2020-03-03 DIAGNOSIS — E782 Mixed hyperlipidemia: Secondary | ICD-10-CM | POA: Diagnosis not present

## 2020-03-03 DIAGNOSIS — Z90710 Acquired absence of both cervix and uterus: Secondary | ICD-10-CM

## 2020-03-03 DIAGNOSIS — Z880 Allergy status to penicillin: Secondary | ICD-10-CM

## 2020-03-03 DIAGNOSIS — I110 Hypertensive heart disease with heart failure: Secondary | ICD-10-CM | POA: Diagnosis present

## 2020-03-03 DIAGNOSIS — R918 Other nonspecific abnormal finding of lung field: Secondary | ICD-10-CM | POA: Diagnosis present

## 2020-03-03 DIAGNOSIS — D649 Anemia, unspecified: Secondary | ICD-10-CM | POA: Diagnosis present

## 2020-03-03 DIAGNOSIS — Z79899 Other long term (current) drug therapy: Secondary | ICD-10-CM

## 2020-03-03 DIAGNOSIS — K869 Disease of pancreas, unspecified: Secondary | ICD-10-CM | POA: Diagnosis present

## 2020-03-03 HISTORY — DX: Other nonspecific abnormal finding of lung field: R91.8

## 2020-03-03 HISTORY — PX: TEE WITHOUT CARDIOVERSION: SHX5443

## 2020-03-03 HISTORY — DX: Presence of prosthetic heart valve: Z95.2

## 2020-03-03 HISTORY — PX: TRANSCATHETER AORTIC VALVE REPLACEMENT, TRANSFEMORAL: SHX6400

## 2020-03-03 HISTORY — DX: Disease of pancreas, unspecified: K86.9

## 2020-03-03 LAB — ECHOCARDIOGRAM LIMITED
AR max vel: 1.82 cm2
AV Area VTI: 1.76 cm2
AV Area mean vel: 1.86 cm2
AV Mean grad: 6 mmHg
AV Peak grad: 12.4 mmHg
Ao pk vel: 1.76 m/s
Area-P 1/2: 3.17 cm2
Calc EF: 67.5 %
Single Plane A2C EF: 63.2 %
Single Plane A4C EF: 68.5 %

## 2020-03-03 LAB — POCT I-STAT 7, (LYTES, BLD GAS, ICA,H+H)
Acid-base deficit: 3 mmol/L — ABNORMAL HIGH (ref 0.0–2.0)
Bicarbonate: 22.4 mmol/L (ref 20.0–28.0)
Calcium, Ion: 1.28 mmol/L (ref 1.15–1.40)
HCT: 31 % — ABNORMAL LOW (ref 36.0–46.0)
Hemoglobin: 10.5 g/dL — ABNORMAL LOW (ref 12.0–15.0)
O2 Saturation: 93 %
Potassium: 3.7 mmol/L (ref 3.5–5.1)
Sodium: 140 mmol/L (ref 135–145)
TCO2: 24 mmol/L (ref 22–32)
pCO2 arterial: 38.9 mmHg (ref 32.0–48.0)
pH, Arterial: 7.368 (ref 7.350–7.450)
pO2, Arterial: 71 mmHg — ABNORMAL LOW (ref 83.0–108.0)

## 2020-03-03 LAB — GLUCOSE, CAPILLARY
Glucose-Capillary: 132 mg/dL — ABNORMAL HIGH (ref 70–99)
Glucose-Capillary: 138 mg/dL — ABNORMAL HIGH (ref 70–99)
Glucose-Capillary: 150 mg/dL — ABNORMAL HIGH (ref 70–99)

## 2020-03-03 LAB — POCT I-STAT, CHEM 8
BUN: 16 mg/dL (ref 8–23)
BUN: 15 mg/dL (ref 8–23)
BUN: 14 mg/dL (ref 8–23)
Calcium, Ion: 1.25 mmol/L (ref 1.15–1.40)
Calcium, Ion: 1.29 mmol/L (ref 1.15–1.40)
Calcium, Ion: 1.28 mmol/L (ref 1.15–1.40)
Chloride: 104 mmol/L (ref 98–111)
Chloride: 104 mmol/L (ref 98–111)
Chloride: 105 mmol/L (ref 98–111)
Creatinine, Ser: 0.8 mg/dL (ref 0.44–1.00)
Creatinine, Ser: 0.8 mg/dL (ref 0.44–1.00)
Creatinine, Ser: 0.8 mg/dL (ref 0.44–1.00)
Glucose, Bld: 172 mg/dL — ABNORMAL HIGH (ref 70–99)
Glucose, Bld: 148 mg/dL — ABNORMAL HIGH (ref 70–99)
Glucose, Bld: 139 mg/dL — ABNORMAL HIGH (ref 70–99)
HCT: 33 % — ABNORMAL LOW (ref 36.0–46.0)
HCT: 29 % — ABNORMAL LOW (ref 36.0–46.0)
HCT: 27 % — ABNORMAL LOW (ref 36.0–46.0)
Hemoglobin: 11.2 g/dL — ABNORMAL LOW (ref 12.0–15.0)
Hemoglobin: 9.9 g/dL — ABNORMAL LOW (ref 12.0–15.0)
Hemoglobin: 9.2 g/dL — ABNORMAL LOW (ref 12.0–15.0)
Potassium: 3.6 mmol/L (ref 3.5–5.1)
Potassium: 3.4 mmol/L — ABNORMAL LOW (ref 3.5–5.1)
Potassium: 3.6 mmol/L (ref 3.5–5.1)
Sodium: 140 mmol/L (ref 135–145)
Sodium: 141 mmol/L (ref 135–145)
Sodium: 141 mmol/L (ref 135–145)
TCO2: 20 mmol/L — ABNORMAL LOW (ref 22–32)
TCO2: 21 mmol/L — ABNORMAL LOW (ref 22–32)
TCO2: 21 mmol/L — ABNORMAL LOW (ref 22–32)

## 2020-03-03 LAB — TYPE AND SCREEN
ABO/RH(D): O POS
Antibody Screen: NEGATIVE

## 2020-03-03 SURGERY — IMPLANTATION, AORTIC VALVE, TRANSCATHETER, FEMORAL APPROACH
Anesthesia: Monitor Anesthesia Care | Site: Chest

## 2020-03-03 MED ORDER — ATORVASTATIN CALCIUM 40 MG PO TABS
40.0000 mg | ORAL_TABLET | Freq: Every day | ORAL | Status: DC
  Administered 2020-03-03: 40 mg via ORAL
  Filled 2020-03-03: qty 1

## 2020-03-03 MED ORDER — CLOPIDOGREL BISULFATE 75 MG PO TABS
75.0000 mg | ORAL_TABLET | Freq: Every day | ORAL | Status: DC
  Administered 2020-03-04: 75 mg via ORAL
  Filled 2020-03-03: qty 1

## 2020-03-03 MED ORDER — HEPARIN SODIUM (PORCINE) 1000 UNIT/ML IJ SOLN
INTRAMUSCULAR | Status: AC
  Filled 2020-03-03: qty 2

## 2020-03-03 MED ORDER — ONDANSETRON HCL 4 MG/2ML IJ SOLN
INTRAMUSCULAR | Status: DC | PRN
  Administered 2020-03-03: 4 mg via INTRAVENOUS

## 2020-03-03 MED ORDER — PROTAMINE SULFATE 10 MG/ML IV SOLN
INTRAVENOUS | Status: DC | PRN
  Administered 2020-03-03: 130 mg via INTRAVENOUS

## 2020-03-03 MED ORDER — ACETAMINOPHEN 650 MG RE SUPP: 650.0000 mg | Freq: Four times a day (QID) | RECTAL | Status: DC | PRN

## 2020-03-03 MED ORDER — LIDOCAINE HCL 1 % IJ SOLN
INTRAMUSCULAR | Status: DC | PRN
  Administered 2020-03-03: 20 mL

## 2020-03-03 MED ORDER — OXYCODONE HCL 5 MG PO TABS: 5.0000 mg | ORAL_TABLET | ORAL | Status: DC | PRN

## 2020-03-03 MED ORDER — SODIUM CHLORIDE 0.9 % IV SOLN
INTRAVENOUS | Status: AC
  Filled 2020-03-03 (×3): qty 1.2

## 2020-03-03 MED ORDER — SODIUM CHLORIDE 0.9% FLUSH: 3.0000 mL | INTRAVENOUS | Status: DC | PRN

## 2020-03-03 MED ORDER — LACTATED RINGERS IV SOLN: INTRAVENOUS | Status: DC | PRN

## 2020-03-03 MED ORDER — INSULIN ASPART 100 UNIT/ML ~~LOC~~ SOLN
0.0000 [IU] | Freq: Three times a day (TID) | SUBCUTANEOUS | Status: DC
  Administered 2020-03-03 – 2020-03-04 (×3): 2 [IU] via SUBCUTANEOUS
  Administered 2020-03-04: 4 [IU] via SUBCUTANEOUS

## 2020-03-03 MED ORDER — FOLIC ACID 1 MG PO TABS
2.0000 mg | ORAL_TABLET | Freq: Every day | ORAL | Status: DC
  Administered 2020-03-03 – 2020-03-04 (×2): 2 mg via ORAL
  Filled 2020-03-03 (×2): qty 2

## 2020-03-03 MED ORDER — ONDANSETRON HCL 4 MG/2ML IJ SOLN: 4.0000 mg | Freq: Four times a day (QID) | INTRAMUSCULAR | Status: DC | PRN

## 2020-03-03 MED ORDER — ESCITALOPRAM OXALATE 5 MG PO TABS
5.0000 mg | ORAL_TABLET | Freq: Every day | ORAL | Status: DC
  Administered 2020-03-03 – 2020-03-04 (×2): 5 mg via ORAL
  Filled 2020-03-03 (×2): qty 1

## 2020-03-03 MED ORDER — MORPHINE SULFATE (PF) 4 MG/ML IV SOLN: 1.0000 mg | INTRAVENOUS | Status: DC | PRN

## 2020-03-03 MED ORDER — INFLUENZA VAC A&B SA ADJ QUAD 0.5 ML IM PRSY
0.5000 mL | PREFILLED_SYRINGE | INTRAMUSCULAR | Status: AC | PRN
  Administered 2020-03-04: 0.5 mL via INTRAMUSCULAR
  Filled 2020-03-03 (×2): qty 0.5

## 2020-03-03 MED ORDER — SODIUM CHLORIDE 0.9 % IV SOLN: INTRAVENOUS | Status: DC

## 2020-03-03 MED ORDER — LIDOCAINE HCL 1 % IJ SOLN
INTRAMUSCULAR | Status: AC
  Filled 2020-03-03: qty 20

## 2020-03-03 MED ORDER — SODIUM CHLORIDE 0.9% FLUSH
3.0000 mL | Freq: Two times a day (BID) | INTRAVENOUS | Status: DC
  Administered 2020-03-04: 3 mL via INTRAVENOUS

## 2020-03-03 MED ORDER — PANTOPRAZOLE SODIUM 40 MG PO TBEC
40.0000 mg | DELAYED_RELEASE_TABLET | Freq: Every day | ORAL | Status: DC
  Administered 2020-03-03 – 2020-03-04 (×2): 40 mg via ORAL
  Filled 2020-03-03 (×2): qty 1

## 2020-03-03 MED ORDER — FENTANYL CITRATE (PF) 250 MCG/5ML IJ SOLN
INTRAMUSCULAR | Status: AC
  Filled 2020-03-03: qty 5

## 2020-03-03 MED ORDER — FERROUS SULFATE 325 (65 FE) MG PO TABS
325.0000 mg | ORAL_TABLET | Freq: Two times a day (BID) | ORAL | Status: DC
  Administered 2020-03-03 – 2020-03-04 (×2): 325 mg via ORAL
  Filled 2020-03-03 (×2): qty 1

## 2020-03-03 MED ORDER — CHLORHEXIDINE GLUCONATE 4 % EX LIQD: 60.0000 mL | Freq: Once | CUTANEOUS | Status: DC

## 2020-03-03 MED ORDER — VANCOMYCIN HCL IN DEXTROSE 1-5 GM/200ML-% IV SOLN
1000.0000 mg | Freq: Once | INTRAVENOUS | Status: AC
  Administered 2020-03-03: 1000 mg via INTRAVENOUS
  Filled 2020-03-03: qty 200

## 2020-03-03 MED ORDER — SODIUM CHLORIDE 0.9 % IV SOLN
INTRAVENOUS | Status: DC | PRN
  Administered 2020-03-03: 500 mL

## 2020-03-03 MED ORDER — TRAMADOL HCL 50 MG PO TABS: 50.0000 mg | ORAL_TABLET | ORAL | Status: DC | PRN

## 2020-03-03 MED ORDER — PROPOFOL 10 MG/ML IV BOLUS
INTRAVENOUS | Status: AC
  Filled 2020-03-03: qty 20

## 2020-03-03 MED ORDER — NITROGLYCERIN IN D5W 200-5 MCG/ML-% IV SOLN: 0.0000 ug/min | INTRAVENOUS | Status: DC

## 2020-03-03 MED ORDER — PREDNISONE 1 MG PO TABS
4.0000 mg | ORAL_TABLET | Freq: Every day | ORAL | Status: DC
  Administered 2020-03-04: 4 mg via ORAL
  Filled 2020-03-03: qty 4

## 2020-03-03 MED ORDER — SODIUM CHLORIDE 0.9 % IV SOLN: INTRAVENOUS | Status: AC

## 2020-03-03 MED ORDER — CHLORHEXIDINE GLUCONATE 0.12 % MT SOLN
OROMUCOSAL | Status: AC
  Administered 2020-03-03: 15 mL via OROMUCOSAL
  Filled 2020-03-03: qty 15

## 2020-03-03 MED ORDER — LEVOFLOXACIN IN D5W 750 MG/150ML IV SOLN
750.0000 mg | INTRAVENOUS | Status: AC
  Administered 2020-03-04: 750 mg via INTRAVENOUS
  Filled 2020-03-03: qty 150

## 2020-03-03 MED ORDER — ACETAMINOPHEN 325 MG PO TABS
650.0000 mg | ORAL_TABLET | Freq: Four times a day (QID) | ORAL | Status: DC | PRN
  Administered 2020-03-03: 650 mg via ORAL
  Filled 2020-03-03: qty 2

## 2020-03-03 MED ORDER — HEPARIN SODIUM (PORCINE) 1000 UNIT/ML IJ SOLN
INTRAMUSCULAR | Status: DC | PRN
  Administered 2020-03-03: 13000 [IU] via INTRAVENOUS

## 2020-03-03 MED ORDER — PHENYLEPHRINE HCL-NACL 20-0.9 MG/250ML-% IV SOLN
0.0000 ug/min | INTRAVENOUS | Status: DC
  Filled 2020-03-03: qty 250

## 2020-03-03 MED ORDER — SODIUM CHLORIDE 0.9 % IV SOLN: 250.0000 mL | INTRAVENOUS | Status: DC | PRN

## 2020-03-03 MED ORDER — FENTANYL CITRATE (PF) 250 MCG/5ML IJ SOLN
INTRAMUSCULAR | Status: DC | PRN
Start: 2020-03-03 — End: 2020-03-03
  Administered 2020-03-03 (×2): 25 ug via INTRAVENOUS

## 2020-03-03 MED ORDER — ASPIRIN 81 MG PO CHEW
81.0000 mg | CHEWABLE_TABLET | Freq: Every day | ORAL | Status: DC
  Administered 2020-03-03 – 2020-03-04 (×2): 81 mg via ORAL
  Filled 2020-03-03 (×2): qty 1

## 2020-03-03 MED ORDER — PROPOFOL 500 MG/50ML IV EMUL
INTRAVENOUS | Status: DC | PRN
  Administered 2020-03-03: 25 ug/kg/min via INTRAVENOUS

## 2020-03-03 MED ORDER — 0.9 % SODIUM CHLORIDE (POUR BTL) OPTIME
TOPICAL | Status: DC | PRN
  Administered 2020-03-03: 1000 mL

## 2020-03-03 MED ORDER — CHLORHEXIDINE GLUCONATE 0.12 % MT SOLN: 15.0000 mL | Freq: Once | OROMUCOSAL | Status: AC

## 2020-03-03 MED ORDER — CHLORHEXIDINE GLUCONATE 4 % EX LIQD: 30.0000 mL | CUTANEOUS | Status: DC

## 2020-03-03 SURGICAL SUPPLY — 93 items
ADH SKN CLS APL DERMABOND .7 (GAUZE/BANDAGES/DRESSINGS) ×2
APL PRP STRL LF DISP 70% ISPRP (MISCELLANEOUS) ×2
BAG DECANTER FOR FLEXI CONT (MISCELLANEOUS) ×1 IMPLANT
BAG SNAP BAND KOVER 36X36 (MISCELLANEOUS) ×3 IMPLANT
BLADE CLIPPER SURG (BLADE) IMPLANT
BLADE STERNUM SYSTEM 6 (BLADE) IMPLANT
BLADE SURG 10 STRL SS (BLADE) IMPLANT
CABLE ADAPT CONN TEMP 6FT (ADAPTER) ×3 IMPLANT
CANISTER SUCT 3000ML PPV (MISCELLANEOUS) IMPLANT
CATH DIAG EXPO 6F AL1 (CATHETERS) ×1 IMPLANT
CATH DIAG EXPO 6F VENT PIG 145 (CATHETERS) ×6 IMPLANT
CATH EXTERNAL FEMALE PUREWICK (CATHETERS) IMPLANT
CATH INFINITI 6F AL2 (CATHETERS) IMPLANT
CATH S G BIP PACING (CATHETERS) ×4 IMPLANT
CHLORAPREP W/TINT 26 (MISCELLANEOUS) ×3 IMPLANT
CLIP VESOCCLUDE MED 24/CT (CLIP) IMPLANT
CLIP VESOCCLUDE SM WIDE 24/CT (CLIP) IMPLANT
CLOSURE MYNX CONTROL 6F/7F (Vascular Products) ×1 IMPLANT
CNTNR URN SCR LID CUP LEK RST (MISCELLANEOUS) ×4 IMPLANT
CONT SPEC 4OZ STRL OR WHT (MISCELLANEOUS) ×6
COVER BACK TABLE 80X110 HD (DRAPES) IMPLANT
COVER WAND RF STERILE (DRAPES) ×2 IMPLANT
DECANTER SPIKE VIAL GLASS SM (MISCELLANEOUS) ×2 IMPLANT
DERMABOND ADVANCED (GAUZE/BANDAGES/DRESSINGS) ×1
DERMABOND ADVANCED .7 DNX12 (GAUZE/BANDAGES/DRESSINGS) ×2 IMPLANT
DEVICE CLOSURE PERCLS PRGLD 6F (VASCULAR PRODUCTS) ×4 IMPLANT
DRAPE INCISE IOBAN 66X45 STRL (DRAPES) IMPLANT
DRSG TEGADERM 4X4.75 (GAUZE/BANDAGES/DRESSINGS) ×6 IMPLANT
ELECT CAUTERY BLADE 6.4 (BLADE) IMPLANT
ELECT REM PT RETURN 9FT ADLT (ELECTROSURGICAL) ×3
ELECTRODE REM PT RTRN 9FT ADLT (ELECTROSURGICAL) ×4 IMPLANT
Edwards Lifesciences ×1 IMPLANT
FELT TEFLON 6X6 (MISCELLANEOUS) ×2 IMPLANT
GAUZE SPONGE 4X4 12PLY STRL (GAUZE/BANDAGES/DRESSINGS) ×3 IMPLANT
GAUZE SPONGE 4X4 12PLY STRL LF (GAUZE/BANDAGES/DRESSINGS) ×2 IMPLANT
GLOVE BIO SURGEON STRL SZ7.5 (GLOVE) IMPLANT
GLOVE BIO SURGEON STRL SZ8 (GLOVE) ×1 IMPLANT
GLOVE BIOGEL PI IND STRL 6.5 (GLOVE) IMPLANT
GLOVE BIOGEL PI INDICATOR 6.5 (GLOVE) ×2
GLOVE EUDERMIC 7 POWDERFREE (GLOVE) IMPLANT
GLOVE ORTHO TXT STRL SZ7.5 (GLOVE) ×1 IMPLANT
GOWN STRL REUS W/ TWL LRG LVL3 (GOWN DISPOSABLE) IMPLANT
GOWN STRL REUS W/ TWL XL LVL3 (GOWN DISPOSABLE) ×2 IMPLANT
GOWN STRL REUS W/TWL LRG LVL3 (GOWN DISPOSABLE) ×3
GOWN STRL REUS W/TWL XL LVL3 (GOWN DISPOSABLE) ×3
GUIDEWIRE SAF TJ AMPL .035X180 (WIRE) ×3 IMPLANT
GUIDEWIRE SAFE TJ AMPLATZ EXST (WIRE) ×3 IMPLANT
INSERT FOGARTY SM (MISCELLANEOUS) IMPLANT
KIT BASIN OR (CUSTOM PROCEDURE TRAY) ×3 IMPLANT
KIT HEART LEFT (KITS) ×3 IMPLANT
KIT SUCTION CATH 14FR (SUCTIONS) IMPLANT
KIT TURNOVER KIT B (KITS) ×3 IMPLANT
LOOP VESSEL MAXI BLUE (MISCELLANEOUS) IMPLANT
LOOP VESSEL MINI RED (MISCELLANEOUS) IMPLANT
NS IRRIG 1000ML POUR BTL (IV SOLUTION) ×3 IMPLANT
PACK ENDO MINOR (CUSTOM PROCEDURE TRAY) ×3 IMPLANT
PAD ARMBOARD 7.5X6 YLW CONV (MISCELLANEOUS) ×6 IMPLANT
PAD ELECT DEFIB RADIOL ZOLL (MISCELLANEOUS) ×3 IMPLANT
PENCIL BUTTON HOLSTER BLD 10FT (ELECTRODE) IMPLANT
PERCLOSE PROGLIDE 6F (VASCULAR PRODUCTS) ×6
POSITIONER HEAD DONUT 9IN (MISCELLANEOUS) ×3 IMPLANT
SET MICROPUNCTURE 5F STIFF (MISCELLANEOUS) ×3 IMPLANT
SHEATH 14X36 EDWARDS (SHEATH) ×1 IMPLANT
SHEATH BRITE TIP 7FR 35CM (SHEATH) ×3 IMPLANT
SHEATH PINNACLE 6F 10CM (SHEATH) ×3 IMPLANT
SHEATH PINNACLE 8F 10CM (SHEATH) ×3 IMPLANT
SHIELD RADPAD ABSORB 11X34 (MISCELLANEOUS) ×1 IMPLANT
SLEEVE REPOSITIONING LENGTH 30 (MISCELLANEOUS) ×3 IMPLANT
SPONGE LAP 18X18 RF (DISPOSABLE) ×1 IMPLANT
STOPCOCK MORSE 400PSI 3WAY (MISCELLANEOUS) ×6 IMPLANT
SUT ETHIBOND X763 2 0 SH 1 (SUTURE) IMPLANT
SUT GORETEX CV 4 TH 22 36 (SUTURE) IMPLANT
SUT GORETEX CV4 TH-18 (SUTURE) IMPLANT
SUT MNCRL AB 3-0 PS2 18 (SUTURE) IMPLANT
SUT PROLENE 5 0 C 1 36 (SUTURE) IMPLANT
SUT PROLENE 6 0 C 1 30 (SUTURE) IMPLANT
SUT SILK  1 MH (SUTURE) ×3
SUT SILK 1 MH (SUTURE) ×2 IMPLANT
SUT VIC AB 2-0 CT1 27 (SUTURE)
SUT VIC AB 2-0 CT1 TAPERPNT 27 (SUTURE) IMPLANT
SUT VIC AB 2-0 CTX 36 (SUTURE) IMPLANT
SUT VIC AB 3-0 SH 8-18 (SUTURE) IMPLANT
SYR 50ML LL SCALE MARK (SYRINGE) ×3 IMPLANT
SYR BULB IRRIG 60ML STRL (SYRINGE) IMPLANT
SYR MEDRAD MARK V 150ML (SYRINGE) ×3 IMPLANT
TOWEL GREEN STERILE (TOWEL DISPOSABLE) ×5 IMPLANT
TRANSDUCER W/STOPCOCK (MISCELLANEOUS) ×6 IMPLANT
TRAY FOLEY SLVR 14FR TEMP STAT (SET/KITS/TRAYS/PACK) IMPLANT
TUBE SUCT INTRACARD DLP 20F (MISCELLANEOUS) IMPLANT
VALVE 23 ULTRA SAPIEN KIT (Valve) ×1 IMPLANT
WIRE AMPLATZ SS-J .035X180CM (WIRE) ×1 IMPLANT
WIRE EMERALD 3MM-J .035X150CM (WIRE) ×3 IMPLANT
WIRE EMERALD 3MM-J .035X260CM (WIRE) ×3 IMPLANT

## 2020-03-03 NOTE — Anesthesia Procedure Notes (Signed)
Procedure Name: MAC Performed by: Terence Googe B, CRNA Pre-anesthesia Checklist: Patient identified, Emergency Drugs available, Suction available, Patient being monitored and Timeout performed Patient Re-evaluated:Patient Re-evaluated prior to induction Oxygen Delivery Method: Simple face mask Preoxygenation: Pre-oxygenation with 100% oxygen Placement Confirmation: positive ETCO2 Dental Injury: Teeth and Oropharynx as per pre-operative assessment        

## 2020-03-03 NOTE — Anesthesia Postprocedure Evaluation (Signed)
Anesthesia Post Note  Patient: Sophia Ward  Procedure(s) Performed: TRANSCATHETER AORTIC VALVE REPLACEMENT, TRANSFEMORAL USING EDWARDS SAPIEN 3 23 MM AORTIC VALVE. (N/A Chest) TRANSESOPHAGEAL ECHOCARDIOGRAM (TEE) (N/A )     Patient location during evaluation: PACU Anesthesia Type: MAC Level of consciousness: awake and alert Pain management: pain level controlled Vital Signs Assessment: post-procedure vital signs reviewed and stable Respiratory status: spontaneous breathing, nonlabored ventilation, respiratory function stable and patient connected to nasal cannula oxygen Cardiovascular status: stable Anesthetic complications: no   No complications documented.  Last Vitals:  Vitals:   03/03/20 0955 03/03/20 1000  BP: (!) 103/46 (!) 112/53  Pulse: (!) 58 (!) 59  Resp: 14 16  Temp:    SpO2: 97% 99%    Last Pain:  Vitals:   03/03/20 1000  TempSrc:   PainSc: [place]

## 2020-03-03 NOTE — Progress Notes (Signed)
Pt arrived to 4e from cath lab. Vitals obtained and being monitored frequently, per protocol. Telemetry applied and CCMD notified x2 verifiers. Left groin level 0. Right groin dressing marked with scant bloody drainage. Per RN transporting pt, the drainage has not increased since their last check. Pt's lunch tray ordered. Husband at bedside.

## 2020-03-03 NOTE — Op Note (Signed)
[facility]   MULTIDISCIPLINARY HEART VALVE TEAM   TAVR OPERATIVE NOTE Date of Procedure:  03/03/2020  Preoperative Diagnosis: Severe Aortic Stenosis   Postoperative Diagnosis: Same   Procedure:   Transcatheter Aortic Valve Replacement - Percutaneous Transfemoral Approach  Edwards Sapien 3 Ultra THV (size 23 mm, model # 9750TFX, serial # [id number])   Co-Surgeons:  Valentina Gu. Roxy Manns, MD and Sherren Mocha, MD  Anesthesiologist:  Renold Don, MD  Echocardiographer:  Jenkins Rouge, MD  Pre-operative Echo Findings:  Severe aortic stenosis  Normal left ventricular systolic function  Post-operative Echo Findings:  No paravalvular leak  Normal/unchanged left ventricular systolic function  BRIEF CLINICAL NOTE AND INDICATIONS FOR SURGERY  86 year old woman with coronary artery disease status post recent PCI of the LAD and right coronary arteries, chronic anemia, chronic immunosuppression for rheumatoid arthritis, and progressive now severe stage D3 aortic stenosis.  The patient presents today for TAVR after undergoing multidisciplinary heart team review of her case.  During the course of the patient's preoperative work up they have been evaluated comprehensively by a multidisciplinary team of specialists coordinated through the [facility] in the [facility].  They have been demonstrated to suffer from symptomatic severe aortic stenosis as noted above. The patient has been counseled extensively as to the relative risks and benefits of all options for the treatment of severe aortic stenosis including long term medical therapy, conventional surgery for aortic valve replacement, and transcatheter aortic valve replacement.  The patient has been independently evaluated in formal cardiac surgical consultation by Dr Roxy Manns, who deemed the patient appropriate for TAVR. Based upon review of all of the patient's preoperative diagnostic  tests they are felt to be candidate for transcatheter aortic valve replacement using the transfemoral approach as an alternative to conventional surgery.    Following the decision to proceed with transcatheter aortic valve replacement, a discussion has been held regarding what types of management strategies would be attempted intraoperatively in the event of life-threatening complications, including whether or not the patient would be considered a candidate for the use of cardiopulmonary bypass and/or conversion to open sternotomy for attempted surgical intervention.  The patient has been advised of a variety of complications that might develop peculiar to this approach including but not limited to risks of death, stroke, paravalvular leak, aortic dissection or other major vascular complications, aortic annulus rupture, device embolization, cardiac rupture or perforation, acute myocardial infarction, arrhythmia, heart block or bradycardia requiring permanent pacemaker placement, congestive heart failure, respiratory failure, renal failure, pneumonia, infection, other late complications related to structural valve deterioration or migration, or other complications that might ultimately cause a temporary or permanent loss of functional independence or other long term morbidity.  The patient provides full informed consent for the procedure as described and all questions were answered preoperatively.  DETAILS OF THE OPERATIVE PROCEDURE  PREPARATION:   The patient is brought to the operating room on the above mentioned date and central monitoring was established by the anesthesia team including placement of a central venous catheter and radial arterial line. The patient is placed in the supine position on the operating table.  Intravenous antibiotics are administered. The patient is monitored closely throughout the procedure under conscious sedation.  Baseline transthoracic echocardiogram is performed. The  patient's chest, abdomen, both groins, and both lower extremities are prepared and draped in a sterile manner. A time out procedure is performed.   PERIPHERAL ACCESS:   Using ultrasound guidance, femoral arterial  and venous access is obtained with placement of 6 Fr sheaths on the left side.  A pigtail diagnostic catheter was passed through the femoral arterial sheath under fluoroscopic guidance into the aortic root.  A temporary transvenous pacemaker catheter was passed through the femoral venous sheath under fluoroscopic guidance into the right ventricle.  The pacemaker was tested to ensure stable lead placement and pacemaker capture. Aortic root angiography was performed in order to determine the optimal angiographic angle for valve deployment.  TRANSFEMORAL ACCESS:  A micropuncture technique is used to access the right femoral artery under fluoroscopic and ultrasound guidance.  2 Perclose devices are deployed at 10' and 2' positions to 'PreClose' the femoral artery. An 8 French sheath is placed and then an Amplatz Superstiff wire is advanced through the sheath. This is changed out for a 14 French transfemoral E-Sheath after progressively dilating over the Superstiff wire.  An AL-1 catheter was used to direct a straight-tip exchange length wire across the native aortic valve into the left ventricle. This was exchanged out for a pigtail catheter and position was confirmed in the LV apex. The pigtail catheter was exchanged for an Amplatz Extra-stiff wire in the LV apex.    BALLOON AORTIC VALVULOPLASTY:  Not performed  TRANSCATHETER HEART VALVE DEPLOYMENT:  An Edwards Sapien 3 transcatheter heart valve (size 23 mm) was prepared and crimped per manufacturer's guidelines, and the proper orientation of the valve is confirmed on the [employer] delivery system. The valve was advanced through the introducer sheath using normal technique until in an appropriate position in the abdominal aorta beyond the  sheath tip. The balloon was then retracted and using the fine-tuning wheel was centered on the valve. The valve was then advanced across the aortic arch using appropriate flexion of the catheter. The valve was carefully positioned across the aortic valve annulus. The Commander catheter was retracted using normal technique. Once final position of the valve has been confirmed by angiographic assessment, the valve is deployed while temporarily holding ventilation and during rapid ventricular pacing to maintain systolic blood pressure < 50 mmHg and pulse pressure < 10 mmHg. The balloon inflation is held for >3 seconds after reaching full deployment volume. Once the balloon has fully deflated the balloon is retracted into the ascending aorta and valve function is assessed using echocardiography. The patient's hemodynamic recovery following valve deployment is good.  The deployment balloon and guidewire are both removed. Echo demostrated acceptable post-procedural gradients, stable mitral valve function, and no aortic insufficiency.    PROCEDURE COMPLETION:  The sheath was removed and femoral artery closure is performed using the 2 previously deployed Perclose devices.  Protamine is administered once femoral arterial repair was complete. The site is clear with no evidence of bleeding or hematoma after the sutures are tightened. The temporary pacemaker and pigtail catheters are removed. Mynx closure  is used for contralateral femoral arterial hemostasis for the 6 Fr sheath.  The patient tolerated the procedure well and is transported to the recovery area in stable condition. There were no immediate intraoperative complications. All sponge instrument and needle counts are verified correct at completion of the operation.   The patient received a total of 40 mL of intravenous contrast during the procedure.   Sherren Mocha, MD 03/03/2020 10:23 AM

## 2020-03-03 NOTE — Anesthesia Procedure Notes (Signed)
Arterial Line Insertion Performed by: Valda Favia, CRNA, CRNA  Patient location: Pre-op. Preanesthetic checklist: patient identified, IV checked, site marked, risks and benefits discussed, surgical consent, monitors and equipment checked, pre-op evaluation, timeout performed and anesthesia consent Left, radial was placed Catheter size: 20 G Hand hygiene performed , maximum sterile barriers used  and Seldinger technique used Allen's test indicative of satisfactory collateral circulation Attempts: 1 Procedure performed without using ultrasound guided technique. Following insertion, dressing applied and Biopatch. Post procedure assessment: normal  Patient tolerated the procedure well with no immediate complications.

## 2020-03-03 NOTE — Discharge Instructions (Signed)
ACTIVITY AND EXERCISE °• Daily activity and exercise are an important part of your recovery. People recover at different rates depending on their general health and type of valve procedure. °• Most people recovering from TAVR feel better relatively quickly  °• No lifting, pushing, pulling more than 10 pounds (examples to avoid: groceries, vacuuming, gardening, golfing): °            - For one week with a procedure through the groin. °            - For six weeks for procedures through the chest wall or neck. °NOTE: You will typically see one of our providers 7-14 days after your procedure to discuss WHEN TO RESUME the above activities.  °  °  °DRIVING °• Do not drive until you are seen for follow up and cleared by a provider. Generally, we ask patient to not drive for 1 week after their procedure. °• If you have been told by your doctor in the past that you may not drive, you must talk with him/her before you begin driving again. °  °DRESSING °• Groin site: you may leave the clear dressing over the site for up to one week or until it falls off. °  °HYGIENE °• If you had a femoral (leg) procedure, you may take a shower when you return home. After the shower, pat the site dry. Do NOT use powder, oils or lotions in your groin area until the site has completely healed. °• If you had a chest procedure, you may shower when you return home unless specifically instructed not to by your discharging practitioner. °            - DO NOT scrub incision; pat dry with a towel. °            - DO NOT apply any lotions, oils, powders to the incision. °            - No tub baths / swimming for at least 2 weeks. °• If you notice any fevers, chills, increased pain, swelling, bleeding or pus, please contact your doctor. °  °ADDITIONAL INFORMATION °• If you are going to have an upcoming dental procedure, please contact our office as you will require antibiotics ahead of time to prevent infection on your heart valve.  ° ° °If you have any  questions or concerns you can call the structural heart phone during normal business hours 8am-4pm. If you have an urgent need after hours or weekends please call [phone] to talk to the on call provider for general cardiology. If you have an emergency that requires immediate attention, please call 911.  ° ° °After TAVR Checklist ° °Check  Test Description  ° Follow up appointment in 1-2 weeks  You will see our structural heart physician assistant, Katie Geroldine Esquivias. Your incision sites will be checked and you will be cleared to drive and resume all normal activities if you are doing well.    ° 1 month echo and follow up  You will have an echo to check on your new heart valve and be seen back in the office by Katie Zyan Mirkin. Many times the echo is not read by your appointment time, but Katie will call you later that day or the following day to report your results.  ° Follow up with your primary cardiologist You will need to be seen by your primary cardiologist in the following 3-6 months after your 1 month appointment in the valve   clinic. Often times your Plavix or Aspirin will be discontinued during this time, but this is decided on a case by case basis.   ° 1 year echo and follow up You will have another echo to check on your heart valve after 1 year and be seen back in the office by Katie Marcene Laskowski. This your last structural heart visit.  ° Bacterial endocarditis prophylaxis  You will have to take antibiotics for the rest of your life before all dental procedures (even teeth cleanings) to protect your heart valve. Antibiotics are also required before some surgeries. Please check with your cardiologist before scheduling any surgeries. Also, please make sure to tell us if you have a penicillin allergy as you will require an alternative antibiotic.   ° ° °

## 2020-03-03 NOTE — Progress Notes (Signed)
  [facility] VALVE TEAM  Patient doing well s/p TAVR. She is hemodynamically stable but requiring a little bit of levophed. Groin sites stable. ECG with sinus and no high grade block. Plan to wean levo, remove arterial line and transfer to 4E. Plan for early ambulation after bedrest completed and hopeful discharge over the next 24-48 hours.   Angelena Form PA-C  [facility]  Pager [phone]

## 2020-03-03 NOTE — Progress Notes (Signed)
  Echocardiogram 2D Echocardiogram has been performed.  Bobbye Charleston 03/03/2020, 9:07 AM

## 2020-03-03 NOTE — Progress Notes (Signed)
Mobility Specialist - Progress Note   03/03/20 1510  Mobility  Activity Ambulated in hall  Level of Assistance Standby assist, set-up cues, supervision of patient - no hands on  Assistive Device None  Distance Ambulated (ft) 320 ft  Mobility Response Tolerated well  Mobility performed by Mobility specialist  $Mobility charge 1 Mobility    Pre-mobility, 3 L/min O2: 67 HR, 118/55 BP, 93% SpO2 During mobility:   3 L/min O2: 86% SpO2   4 L/min O2: 72 HR, 95% SpO2 Post-mobility, 3 L/min O2: 71 HR, 153/53 BP, 94% SpO2  Pt asx throughout ambulation, groin sites evaluated by her RN. Pt's SpO2 dropped to mid-80s on 3 L of O2, and held at 95% on 4 L.   Pricilla Handler Mobility Specialist Mobility Specialist Phone: [phone]

## 2020-03-03 NOTE — Interval H&P Note (Signed)
History and Physical Interval Note:  03/03/2020 10:23 AM  Sophia Ward  has presented today for surgery, with the diagnosis of Severe Aortic Stenosis.  The various methods of treatment have been discussed with the patient and family. After consideration of risks, benefits and other options for treatment, the patient has consented to  Procedure(s): TRANSCATHETER AORTIC VALVE REPLACEMENT, TRANSFEMORAL USING EDWARDS SAPIEN 3 23 MM AORTIC VALVE. (N/A) TRANSESOPHAGEAL ECHOCARDIOGRAM (TEE) (N/A) as a surgical intervention.  The patient's history has been reviewed, patient examined, no change in status, stable for surgery.  I have reviewed the patient's chart and labs.  Questions were answered to the patient's satisfaction.     Sherren Mocha

## 2020-03-03 NOTE — Transfer of Care (Signed)
Immediate Anesthesia Transfer of Care Note  Patient: Sophia Ward  Procedure(s) Performed: TRANSCATHETER AORTIC VALVE REPLACEMENT, TRANSFEMORAL USING EDWARDS SAPIEN 3 23 MM AORTIC VALVE. (N/A Chest) TRANSESOPHAGEAL ECHOCARDIOGRAM (TEE) (N/A )  Patient Location: Cath Lab  Anesthesia Type:MAC  Level of Consciousness: awake, alert  and oriented  Airway & Oxygen Therapy: Patient Spontanous Breathing and Patient connected to face mask oxygen  Post-op Assessment: Report given to RN and Post -op Vital signs reviewed and stable  Post vital signs: Reviewed and stable  Last Vitals:  Vitals Value Taken Time  BP    Temp    Pulse    Resp    SpO2      Last Pain:  Vitals:   03/03/20 0630  TempSrc:   PainSc: 0-No pain         Complications: No complications documented.

## 2020-03-03 NOTE — Op Note (Signed)
[facility]   MULTIDISCIPLINARY HEART VALVE TEAM   TAVR OPERATIVE NOTE   Date of Procedure:  03/03/2020  Preoperative Diagnosis: Severe Aortic Stenosis   Postoperative Diagnosis: Same   Procedure:    Transcatheter Aortic Valve Replacement - Percutaneous Right Transfemoral Approach  Edwards Sapien 3 Ultra THV (size 23 mm, model # 9750TFX, serial # [id number])   Co-Surgeons:  Valentina Gu. Roxy Manns, MD and Sherren Mocha, MD  Anesthesiologist:  Renold Don, MD  Echocardiographer:  Jenkins Rouge, MD  Pre-operative Echo Findings:  Severe aortic stenosis  Normal left ventricular systolic function  Post-operative Echo Findings:  No paravalvular leak  Normal left ventricular systolic function   BRIEF CLINICAL NOTE AND INDICATIONS FOR SURGERY  Patient is an 86 year old moderately obese female with coronary artery disease status post PCI and stenting of left anterior descending coronary artery following acute myocardial infarction in 2004, aortic stenosis, hypertension, type 2 diabetes mellitus on insulin, hyperlipidemia, arthritis with positive rheumatoid factor on long-term methotrexate and currently on prednisone, and spinal stenosis with chronic back pain who has been referred for surgical consultation to discuss treatment options for management of severe symptomatic aortic stenosis and multivessel coronary artery disease.  Patient's cardiac history dates back to 2004 when she presented with an acute myocardial infarction.  She was treated with PCI and stenting of the left anterior descending coronary artery at that time.  Apparently one of the 2 stents placed with drug-eluting and the other was not.  The patient had early in-stent restenosis and underwent repeat PCI and stenting approximately 5 months later.  She has done well from a cardiac standpoint until recently.  In 2018 she was diagnosed with polymyalgia rheumatica and later found to have rheumatoid arthritis.  She  was started on methotrexate and prednisone at that time.  She has been follow-up recently by Dr. Irish Lack for the last several years.  Echocardiogram performed September 2019 revealed normal left ventricular systolic function with ejection fraction estimated 60 to 65%.  The patient was noted to have moderate aortic stenosis with peak velocity across aortic valve measured 3.4 m/s corresponding to mean transvalvular gradient estimated 22 mmHg and aortic valve area calculated 0.78 cm by VTI.  The DVI was reported 0.32 at that time.  In May of this year the patient was hospitalized with worsening fatigue and exertional shortness of breath.  She was found to have a drop in her hemoglobin to 7.9 from baseline 13.4.  She had no documented history of heme positive stools.  She was transfused packed red blood cells and received iron infusions.  EGD and colonoscopy were performed that revealed no clear evidence for GI bleeding.  Transthoracic echocardiogram performed at that time revealed normal left ventricular function with severe aortic stenosis.  Peak velocity across aortic valve measured 3.8 m/s corresponding to mean transvalvular gradient estimated 32 mmHg and aortic valve area calculated 0.76 cm.  The DVI was reported 0.24 with stroke-volume index 31.  The patient also was noted to have new onset paroxysmal atrial fibrillation.  She spontaneously converted back to sinus rhythm during her hospitalization.  Hemoglobin stabilized and she was ultimately discharged home on Eliquis for long-term anticoagulation.  She was seen in follow-up by Dr. Irish Lack and plans were made for diagnostic cardiac catheterization.  However, she was hospitalized again for pneumonia and urinary tract infection in August.  Hemoglobin has remained stable.  She reportedly has had some recurrence of atrial fibrillation but is for the most part maintaining sinus rhythm.  The patient eventually underwent diagnostic cardiac catheterization on  January 29, 2020.  Coronary angiography revealed severe proximal two-vessel coronary artery disease with ostial stenosis of the right coronary artery and 70% proximal stenosis of left anterior descending coronary artery prior to widely patent previously placed stents.  There was nonobstructive disease in the left circumflex territory.  Mean transvalvular gradient across the aortic valve was measured 16.8 mmHg on pullback corresponding to aortic valve area calculated 1.59 cm.  Pulmonary artery pressures were moderately elevated.  CT angiography was performed and the patient referred for surgical consultation.  During the course of the patient's preoperative work up they have been evaluated comprehensively by a multidisciplinary team of specialists coordinated through the [facility] in the [facility].  They have been demonstrated to suffer from symptomatic severe aortic stenosis as noted above. The patient has been counseled extensively as to the relative risks and benefits of all options for the treatment of severe aortic stenosis including long term medical therapy, conventional surgery for aortic valve replacement, and transcatheter aortic valve replacement.  All questions have been answered, and the patient provides full informed consent for the operation as described.   DETAILS OF THE OPERATIVE PROCEDURE  PREPARATION:    The patient is brought to the operating room on the above mentioned date and appropriate monitoring was established by the anesthesia team. The patient is placed in the supine position on the operating table.  Intravenous antibiotics are administered. The patient is monitored closely throughout the procedure under conscious sedation.  Baseline transthoracic echocardiogram was performed. The patient's chest, abdomen, both groins, and both lower extremities are prepared and draped in a sterile manner. A time out procedure is  performed.   PERIPHERAL ACCESS:    Using the modified Seldinger technique, femoral arterial and venous access was obtained with placement of 6 Fr sheaths on the left side.  A pigtail diagnostic catheter was passed through the left arterial sheath under fluoroscopic guidance into the aortic root.  A temporary transvenous pacemaker catheter was passed through the left femoral venous sheath under fluoroscopic guidance into the right ventricle.  The pacemaker was tested to ensure stable lead placement and pacemaker capture. Aortic root angiography was performed in order to determine the optimal angiographic angle for valve deployment.   TRANSFEMORAL ACCESS:   Percutaneous transfemoral access and sheath placement was performed using ultrasound guidance.  The right common femoral artery was cannulated using a micropuncture needle and appropriate location was verified using hand injection angiogram.  A pair of Abbott Perclose percutaneous closure devices were placed and a 6 French sheath replaced into the femoral artery.  The patient was heparinized systemically and ACT verified > 250 seconds.    A 14 Fr transfemoral E-sheath was introduced into the right common femoral artery after progressively dilating over an Amplatz superstiff wire. An AL-1 catheter was used to direct a straight-tip exchange length wire across the native aortic valve into the left ventricle. This was exchanged out for a pigtail catheter and position was confirmed in the LV apex.  The pigtail catheter was exchanged for an Amplatz Extra-stiff wire in the LV apex.  Echocardiography was utilized to confirm appropriate wire position and no sign of entanglement in the mitral subvalvular apparatus.    TRANSCATHETER HEART VALVE DEPLOYMENT:   An Edwards Sapien 3 Ultra transcatheter heart valve (size 23 mm, model #[id number], serial #[id number]) was prepared and crimped per manufacturer's guidelines, and the proper orientation of the  valve is  confirmed on the [employer] delivery system. The valve was advanced through the introducer sheath using normal technique until in an appropriate position in the abdominal aorta beyond the sheath tip. The balloon was then retracted and using the fine-tuning wheel was centered on the valve. The valve was then advanced across the aortic arch using appropriate flexion of the catheter. The valve was carefully positioned across the aortic valve annulus. The Commander catheter was retracted using normal technique. Once final position of the valve has been confirmed by angiographic assessment, the valve is deployed while temporarily holding ventilation and during rapid ventricular pacing to maintain systolic blood pressure < 50 mmHg and pulse pressure < 10 mmHg. The balloon inflation is held for >3 seconds after reaching full deployment volume. Once the balloon has fully deflated the balloon is retracted into the ascending aorta and valve function is assessed using echocardiography. There is felt to be no paravalvular leak and no central aortic insufficiency.  The patient's hemodynamic recovery following valve deployment is good.  The deployment balloon and guidewire are both removed.    PROCEDURE COMPLETION:   The sheath was removed and femoral artery closure performed.  Protamine was administered once femoral arterial repair was complete. The temporary pacemaker, pigtail catheters and femoral sheaths were removed with manual pressure used for hemostasis.  A Mynx femoral closure device was utilized following removal of the diagnostic sheath in the left femoral artery.  The patient tolerated the procedure well and is transported to the surgical intensive care in stable condition. There were no immediate intraoperative complications. All sponge instrument and needle counts are verified correct at completion of the operation.   No blood products were administered during the operation.  The patient received a  total of 40 mL of intravenous contrast during the procedure.   Rexene Alberts, MD 03/03/2020 9:32 AM

## 2020-03-04 ENCOUNTER — Encounter (HOSPITAL_COMMUNITY): Payer: Self-pay | Admitting: Cardiovascular Disease

## 2020-03-04 ENCOUNTER — Inpatient Hospital Stay (HOSPITAL_COMMUNITY): Payer: Medicare Other

## 2020-03-04 ENCOUNTER — Other Ambulatory Visit: Payer: Self-pay | Admitting: Physician Assistant

## 2020-03-04 DIAGNOSIS — Z006 Encounter for examination for normal comparison and control in clinical research program: Secondary | ICD-10-CM | POA: Diagnosis not present

## 2020-03-04 DIAGNOSIS — Z20822 Contact with and (suspected) exposure to covid-19: Secondary | ICD-10-CM | POA: Diagnosis not present

## 2020-03-04 DIAGNOSIS — Z952 Presence of prosthetic heart valve: Secondary | ICD-10-CM

## 2020-03-04 DIAGNOSIS — I350 Nonrheumatic aortic (valve) stenosis: Secondary | ICD-10-CM | POA: Diagnosis not present

## 2020-03-04 DIAGNOSIS — I5033 Acute on chronic diastolic (congestive) heart failure: Secondary | ICD-10-CM | POA: Diagnosis not present

## 2020-03-04 DIAGNOSIS — Z23 Encounter for immunization: Secondary | ICD-10-CM | POA: Diagnosis present

## 2020-03-04 LAB — ECHOCARDIOGRAM LIMITED
AR max vel: 1.17 cm2
AV Area VTI: 1.2 cm2
AV Area mean vel: 1.19 cm2
AV Mean grad: 20 mmHg
AV Peak grad: 37.9 mmHg
Ao pk vel: 3.08 m/s
Height: 62 in
Weight: 2737.23 oz

## 2020-03-04 LAB — GLUCOSE, CAPILLARY
Glucose-Capillary: 123 mg/dL — ABNORMAL HIGH (ref 70–99)
Glucose-Capillary: 190 mg/dL — ABNORMAL HIGH (ref 70–99)

## 2020-03-04 LAB — CBC
HCT: 30.6 % — ABNORMAL LOW (ref 36.0–46.0)
Hemoglobin: 8.9 g/dL — ABNORMAL LOW (ref 12.0–15.0)
MCH: 26.6 pg (ref 26.0–34.0)
MCHC: 29.1 g/dL — ABNORMAL LOW (ref 30.0–36.0)
MCV: 91.3 fL (ref 80.0–100.0)
Platelets: 280 10*3/uL (ref 150–400)
RBC: 3.35 MIL/uL — ABNORMAL LOW (ref 3.87–5.11)
RDW: 19.2 % — ABNORMAL HIGH (ref 11.5–15.5)
WBC: 11.5 10*3/uL — ABNORMAL HIGH (ref 4.0–10.5)
nRBC: 0 % (ref 0.0–0.2)

## 2020-03-04 LAB — BASIC METABOLIC PANEL
Anion gap: 9 (ref 5–15)
BUN: 12 mg/dL (ref 8–23)
CO2: 22 mmol/L (ref 22–32)
Calcium: 8.5 mg/dL — ABNORMAL LOW (ref 8.9–10.3)
Chloride: 105 mmol/L (ref 98–111)
Creatinine, Ser: 1.05 mg/dL — ABNORMAL HIGH (ref 0.44–1.00)
GFR calc non Af Amer: 49 mL/min — ABNORMAL LOW (ref >60)
Glucose, Bld: 106 mg/dL — ABNORMAL HIGH (ref 70–99)
Potassium: 3.9 mmol/L (ref 3.5–5.1)
Sodium: 136 mmol/L (ref 135–145)

## 2020-03-04 LAB — MAGNESIUM: Magnesium: 1.5 mg/dL — ABNORMAL LOW (ref 1.7–2.4)

## 2020-03-04 MED ORDER — FUROSEMIDE 20 MG PO TABS: 20.0000 mg | ORAL_TABLET | Freq: Every day | ORAL | 0 refills | Status: DC

## 2020-03-04 MED FILL — Potassium Chloride Inj 2 mEq/ML: INTRAVENOUS | Qty: 40 | Status: AC

## 2020-03-04 MED FILL — Heparin Sodium (Porcine) Inj 1000 Unit/ML: INTRAMUSCULAR | Qty: 30 | Status: AC

## 2020-03-04 MED FILL — Magnesium Sulfate Inj 50%: INTRAMUSCULAR | Qty: 10 | Status: AC

## 2020-03-04 NOTE — Discharge Summary (Addendum)
[facility]VE TEAM  Discharge Summary    Patient ID: Sophia Ward MRN: [id number]; DOB: January 17, 1933  Admit date: 03/03/2020 Discharge date: 03/04/2020  Primary Care Provider: [name], MD  Primary Cardiologist: Larae Grooms, MD / Dr. Burt Knack & Dr. Roxy Manns (TAVR)  Discharge Diagnoses    Principal Problem:   S/P TAVR (transcatheter aortic valve replacement) Active Problems:   Coronary atherosclerosis of native coronary artery   Mixed hyperlipidemia   Essential hypertension, benign   Obesity, unspecified   Rheumatoid arthritis involving multiple sites with positive rheumatoid factor (HCC)   History of gastroesophageal reflux (GERD)   Chronic gout involving toe without tophus   Polymyalgia rheumatica (HCC)   Bilateral primary osteoarthritis of knee   Anemia   Severe aortic stenosis   Diabetes mellitus without complication (HCC)   Pulmonary nodules   Lesion of pancreas   Acute on chronic diastolic heart failure (HCC)   Allergies Allergies  Allergen Reactions  . Codeine Nausea And Vomiting    MAKES HER SICK  . Glimepiride Other (See Comments)    Can't remember  . Jardiance [Empagliflozin] Other (See Comments)    Yeast  . Metformin And Related Diarrhea  . Penicillins Rash    Diagnostic Studies/Procedures     TAVR OPERATIVE NOTE   Date of Procedure:                03/03/2020  Preoperative Diagnosis:      Severe Aortic Stenosis   Postoperative Diagnosis:    Same   Procedure:        Transcatheter Aortic Valve Replacement - Percutaneous Right Transfemoral Approach             Edwards Sapien 3 Ultra THV (size 23 mm, model # 9750TFX, serial # [id number])              Co-Surgeons:                        Valentina Gu. Roxy Manns, MD and Sherren Mocha, MD  Anesthesiologist:                  Renold Don, MD  Echocardiographer:              Jenkins Rouge, MD  Pre-operative Echo Findings: ? Severe aortic  stenosis ? Normal left ventricular systolic function  Post-operative Echo Findings: ? No paravalvular leak ? Normal left ventricular systolic function _____________  Echo 03/04/20: complete but pending formal read at the time of discharge     History of Present Illness     Sophia Ward is a 86 y.o. female with a history of CAD s/p DES to LAD x1 (2004), chronic back issues, rheumatoid arthritis (on MTX and prednisone), multifactorial anemia, PAF previously on Eliquis, DMT2, HTN, HLD and severe aortic stenosis who presented to [facility] on 03/03/20 for planned TAVR.   The patient's cardiac history dates back to 2004 when she presented with an acute MI.  She was treated with PCI and stenting of the LAD.  The patient had early in-stent restenosis and underwent repeat PCI and stenting around 5 months later.  She has been followed by Dr. Irish Lack and noted to have at least moderate aortic stenosis by echo in 2019: EF 60%, mean gradient of 22 mm Hg.   She was admitted in May 2021 with acute blood loss anemia with a hemoglobin down to 7.9.  She underwent EGD and colonoscopy  which were unremarkable except for a few ulcers in the terminal ileum.  During this admission she was also noted to go into paroxysmal atrial fibrillation. This was a new diagnosis for her.  She was started on Eliquis and Plavix was discontinued.  2D echo during this admission revealed normal LV function with progression of her aortic stenosis to severe: mean gradient 32 mmHg, peak gradient 56.9 mm hg, AVA 0.80 cm2, DVI 0.24, SVI 31. Plans were made to proceed with [facility].   She was then readmitted in 12/2019 for UTI and acute hypoxic respiratory failure due to left lower lobe pneumonia and acute decompensated diastolic heart failure. Covid test was negative.  The patient eventually underwent left and right heart catheterization on 01/29/2020 with Dr. Beau Fanny.  This revealed severe proximal two-vessel CAD with ostial stenosis of the  RCA and 70% proximal stenosis of the LAD prior to widely patent previously placed stents.  There was nonobstructive disease in the left circumflex. Mean transvalvular gradient across the aortic valve was measured 16.8 mmHg on pullback corresponding to aortic valve area calculated 1.59 cm. Pulmonary artery pressures were moderately elevated. On 02/25/20 she underwent staged successful PCI of the ostial RCA, followed byorbital atherectomy and PCI ofthe left main/LAD.Struts to circumflex opened with small balloon as well since the circumflex was jailed. She had worsening of her chronic anemia with a Hg down to 7.2 and was transfused. Her case was discussed by the multidisciplinary valve team as well as her hematologist, Dr. Irene Limbo. It was decided to proceed with TAVR and follow Hg closely.   Hospital Course     Consultants: none  Severe AS: s/p successful TAVR with a 23 mm Edwards Sapien 3 Ultra THV via the TF approach on 03/03/20. Post operative echo completed but pending formal read. Groin sites are stable. ECG with sinus and no high grade heart block. Continue Asprin and plavix after recent PCI and TAVR. Plan close follow up in the office next week.   PAF: maintaining sinus this admission. Continue to hold Eliquis given need for DAPT with recent PCI and issues with ongoing anemia.   Chronic multifactorial anemia: Hg has remained stable since transfusion last admission but slowly trending down (10.5--> 9.9--> 9.2--> 8.9). Will follow with CBC next week. She will continue her iron supplementation and close follow up with hematologist  HTN: BP elevated. Resume home medications  DMT2: treated with SSI while admitted. Resume home meds at discharge.   Acute on chronic diastolic CHF: as evidenced by an elevated BNP on pre admission lab work and pleural effusion on CXR and mild pulm vascular congestion. This has been treated with TAVR. She had oxygen desaturation with ambulation. Suspect this might be 2/2  mild volume overload. Will give her a short course of lasix and order home 02. Will check a BMET next week.   Incidental findings: pre TAVR CT showed Tiny 1-2 mm pulmonary nodules scattered throughout the periphery of the lungs bilaterally, nonspecific, but statistically likely benign. No follow-up needed if patient is low-risk (and has no known or suspected primary neoplasm). Non-contrast chest CT can be considered in 12 months if patient is high-risk.  1.1 x 1.0 cm intermediate attenuation lesion in the inferior aspect of the uncinate process of the pancreas. This is nonspecific, but favored to represent a benign lesions such as a small pancreatic pseudocyst. Follow-up evaluation with pancreatic protocol CT scan or abdominal MRI with and without IV gadolinium with MRCP in 2 years is recommended to ensure  the stability of this lesion.  _____________  Discharge Vitals Blood pressure (!) 144/56, pulse 76, temperature 98.3 F (36.8 C), temperature source Oral, resp. rate 17, height 5\' 2"  (1.575 m), weight 77.6 kg, SpO2 95 %.  Filed Weights   03/03/20 0548 03/03/20 1219 03/04/20 0439  Weight: 77.1 kg 81.4 kg 77.6 kg    GEN: Well nourished, well developed, in no acute distress, obese HEENT: normal Neck: no JVD or masses Cardiac: RRR; no murmurs, rubs, or gallops,no edema  Respiratory:  clear to auscultation bilaterally, normal work of breathing GI: soft, nontender, nondistended, + BS MS: no deformity or atrophy Skin: warm and dry, no rash.  Groin sites clear without hematoma. Mild ecchymosis on right groin but soft.  Neuro:  Alert and Oriented x 3, Strength and sensation are intact Psych: euthymic mood, full affect    Labs & Radiologic Studies    CBC Recent Labs    03/03/20 0939 03/04/20 0431  WBC  --  11.5*  HGB 9.2* 8.9*  HCT 27.0* 30.6*  MCV  --  91.3  PLT  --  568   Basic Metabolic Panel Recent Labs    03/03/20 0939 03/04/20 0431  NA 141 136  K 3.6 3.9  CL 105 105    CO2  --  22  GLUCOSE 139* 106*  BUN 14 12  CREATININE 0.80 1.05*  CALCIUM  --  8.5*  MG  --  1.5*   Liver Function Tests No results for input(s): AST, ALT, ALKPHOS, BILITOT, PROT, ALBUMIN in the last 72 hours. No results for input(s): LIPASE, AMYLASE in the last 72 hours. Cardiac Enzymes No results for input(s): CKTOTAL, CKMB, CKMBINDEX, TROPONINI in the last 72 hours. BNP Invalid input(s): POCBNP D-Dimer No results for input(s): DDIMER in the last 72 hours. Hemoglobin A1C No results for input(s): HGBA1C in the last 72 hours. Fasting Lipid Panel No results for input(s): CHOL, HDL, LDLCALC, TRIG, CHOLHDL, LDLDIRECT in the last 72 hours. Thyroid Function Tests No results for input(s): TSH, T4TOTAL, T3FREE, THYROIDAB in the last 72 hours.  Invalid input(s): FREET3 _____________  DG Chest 2 View  Result Date: 02/29/2020 CLINICAL DATA:  Cardiac surgery preoperative evaluation. EXAM: CHEST - 2 VIEW COMPARISON:  02/07/2020 chest radiograph. FINDINGS: Stable cardiomediastinal silhouette with mild cardiomegaly. No pneumothorax. Trace bilateral pleural effusions. Cephalization of the pulmonary vasculature without overt pulmonary edema. No acute consolidative airspace disease. IMPRESSION: 1. Stable mild cardiomegaly without overt pulmonary edema. 2. Trace bilateral pleural effusions. Electronically Signed   By: Ilona Sorrel M.D.   On: 02/29/2020 09:01   DG Chest 2 View  Result Date: 02/07/2020 CLINICAL DATA:  Recent pneumonia EXAM: CHEST - 2 VIEW COMPARISON:  Chest radiograph January 20, 2020 and chest CT angiogram February 07, 2020. FINDINGS: There is mild interstitial edema in the lower lung regions. No edema or airspace opacity. Small pleural effusions appreciable on CT are not evident by radiography. The heart is borderline enlarged with pulmonary vascularity normal. No adenopathy. There is aortic atherosclerosis. No bone lesions. IMPRESSION: Borderline cardiomegaly with mild bibasilar  interstitial edema. There may be a degree of congestive heart failure. No consolidation. Pleural effusions seen on CT are not appreciable by radiography. Aortic Atherosclerosis (ICD10-I70.0). Electronically Signed   By: Lowella Grip III M.D.   On: 02/07/2020 16:33   CARDIAC CATHETERIZATION  Addendum Date: 02/21/2020    Ost RCA to Prox RCA lesion is 75% stenosed.  A drug-eluting stent was successfully placed using a SYNERGY XD 3.50X16,  postdilated to > 4 mm.  Post intervention, there is a 0% residual stenosis.  Dist LM to Ost LAD lesion is 25% stenosed. Ost LAD to Prox LAD lesion is 75% stenosed.  After orbital atherectomy, a drug-eluting stent was successfully placed using a SYNERGY XD 3.0X24, postdilated to 3.4 in the mid vessel and > 4 mm in the proximal/ostial LAD and left main. Stent optimized with IVUS.  Post intervention, there is a 0% residual stenosis.  Ost Cx to Prox Cx lesion is 75% stenosed, after being jailed by the stent.  Balloon angioplasty was performed using a BALLOON SAPPHIRE 2.0X12.  Post intervention, there is a 10% residual stenosis.  Successful PCI of the ostial RCA, followed by the left main/LADContinue dual antiplatelet therapy.  Struts to circumflex opened with small balloon as well since the circumflex was jailed. Continue DAPT.  Will hold Eliquis for now.  Plan for TAVR w/u.   Result Date: 02/21/2020  Ost RCA to Prox RCA lesion is 75% stenosed.  A drug-eluting stent was successfully placed using a SYNERGY XD 3.50X16, postdilated to > 4 mm.  Post intervention, there is a 0% residual stenosis.  Dist LM to Ost LAD lesion is 25% stenosed. Ost LAD to Prox LAD lesion is 75% stenosed.  After orbital atherectomy, a drug-eluting stent was successfully placed using a SYNERGY XD 3.0X24, postdilated to 3.4 in the mid vessel and > 4 mm in the proximal/ostial LAD and left main.  Post intervention, there is a 0% residual stenosis.  Ost Cx to Prox Cx lesion is 75% stenosed,  after being jailed by the stent.  Balloon angioplasty was performed using a BALLOON SAPPHIRE 2.0X12.  Post intervention, there is a 10% residual stenosis.  Successful PCI of the ostial RCA, followed by the left main/LADContinue dual antiplatelet therapy.  Struts to circumflex opened with small balloon as well since the circumflex was jailed. Continue DAPT.  Will hold Eliquis for now.  Plan for TAVR w/u.   CT CORONARY MORPH W/CTA COR W/SCORE W/CA W/CM &/OR WO/CM  Addendum Date: 02/07/2020   ADDENDUM REPORT: 02/07/2020 13:17 CLINICAL DATA:  Aortic stenosis EXAM: Cardiac TAVR CT TECHNIQUE: The patient was scanned on a Siemens Force 786 slice scanner. A 120 kV retrospective scan was triggered in the descending thoracic aorta at 111 HU's. Gantry rotation speed was 270 msecs and collimation was .9 mm. No beta blockade or nitro were given. The 3D data set was reconstructed in 5% intervals of the R-R cycle. Systolic and diastolic phases were analyzed on a dedicated work station using MPR, MIP and VRT modes. The patient received 80 cc of contrast. FINDINGS: Aortic Valve: Tri leaflet calcified with restricted leaflet motion Calcium score 1211 Aorta: No aneurysm, normal arch vessels moderate calcific atherosclerosis Sinotubular Junction: 25 mm with moderate calcification Ascending Thoracic Aorta: 28 mm Aortic Arch: 24 mm Descending Thoracic Aorta: 24 mm Sinus of Valsalva Measurements: Non-coronary: 27.3 mm Right - coronary: 25.3 mm Left - coronary: 26.7 mm Coronary Artery Height above Annulus: Left Main: 11 mm above annulus Right Coronary: 12.4 mm above annulus Virtual Basal Annulus Measurements: Maximum/Minimum Diameter: 23.7 mm x 17.8 mm Perimeter: 68.6 mm Area: 341 mm 2 Coronary Arteries: Sufficient height above annulus for deployment Optimum Fluoroscopic Angle for Delivery: LAO 22 Caudal 6 degrees IMPRESSION: 1. Calcified tri leaflet AV with annular area of 341 mm2 lower end for 23 mm Sapien 3 valve alternatively  would consider a 26 mm Evolut Pro valve 2.  Coronary arteries sufficient height  for deployment 3. Optimum angiographic angle for deployment LAO 22 Caudal 6 degrees 4.  Normal aortic root 2.8 cm Jenkins Rouge Electronically Signed   By: Jenkins Rouge M.D.   On: 02/07/2020 13:17   Result Date: 02/07/2020 EXAM: OVER-READ INTERPRETATION  CT CHEST The following report is an over-read performed by radiologist Dr. Vinnie Langton of [facility], [place] on 02/07/2020. This over-read does not include interpretation of cardiac or coronary anatomy or pathology. The coronary calcium score/coronary CTA interpretation by the cardiologist is attached. COMPARISON:  None. FINDINGS: Extracardiac findings will be described separately under dictation for contemporaneously obtained CTA chest, abdomen and pelvis. IMPRESSION: Please see separate dictation for contemporaneously obtained CTA chest, abdomen and pelvis dated 02/07/2020 for full description of relevant extracardiac findings. Electronically Signed: By: Vinnie Langton M.D. On: 02/07/2020 11:55   CT ANGIO CHEST AORTA W/CM & OR WO/CM  Result Date: 02/07/2020 CLINICAL DATA:  86 year old female with history of severe aortic stenosis. Preprocedural study prior to potential transcatheter aortic valve replacement (TAVR) procedure. EXAM: CT ANGIOGRAPHY CHEST, ABDOMEN AND PELVIS TECHNIQUE: Multidetector CT imaging through the chest, abdomen and pelvis was performed using the standard protocol during bolus administration of intravenous contrast. Multiplanar reconstructed images and MIPs were obtained and reviewed to evaluate the vascular anatomy. CONTRAST:  11mL OMNIPAQUE IOHEXOL 350 MG/ML SOLN COMPARISON:  CT the abdomen and pelvis 10/22/2019. No prior chest CT. FINDINGS: CTA CHEST FINDINGS Cardiovascular: Heart size is mildly enlarged. Trace amount of pericardial fluid and/or thickening, unlikely to be of any hemodynamic significance at this time. No associated pericardial  calcification. There is aortic atherosclerosis, as well as atherosclerosis of the great vessels of the mediastinum and the coronary arteries, including calcified atherosclerotic plaque in the left main, left anterior descending, left circumflex and right coronary arteries. Thickening calcification of the aortic valve. Mediastinum/Lymph Nodes: Multiple prominent borderline enlarged mediastinal and bilateral hilar lymph nodes, nonspecific. Esophagus is unremarkable in appearance. No axillary lymphadenopathy. Lungs/Pleura: A few scattered tiny 1-2 mm pulmonary nodules are noted throughout the periphery of the lungs bilaterally, nonspecific, but statistically likely to represent areas of mucoid impaction within terminal bronchioles. Mild ground-glass attenuation and interlobular septal thickening scattered throughout the lungs bilaterally, likely to reflect a background of mild interstitial pulmonary edema. No confluent consolidative airspace disease. Small bilateral pleural effusions lying dependently. Musculoskeletal/Soft Tissues: Old healed nondisplaced fracture of the inferior aspect of the sternum. There are no aggressive appearing lytic or blastic lesions noted in the visualized portions of the skeleton. CTA ABDOMEN AND PELVIS FINDINGS Hepatobiliary: No suspicious cystic or solid hepatic lesions. Tiny calcified granuloma in the left lobe of the liver incidentally noted. No intra or extrahepatic biliary ductal dilatation. Tiny calcified gallstones lying dependently in the gallbladder. No findings to suggest an acute cholecystitis at this time. Pancreas: In the inferior aspect of the uncinate process of the pancreas there is a 1.1 x 1.0 cm intermediate attenuation (44 HU) lesion (axial image 137 of series 15). No other pancreatic mass. No pancreatic ductal dilatation. No peripancreatic fluid collections or inflammatory changes are noted. Spleen: Unremarkable. Adrenals/Urinary Tract: Bilateral kidneys and right  adrenal gland are normal in appearance. 1.1 x 0.9 cm left adrenal nodule, stable compared to the prior CT examination, previously -11 HU (compatible with an adenoma). No hydroureteronephrosis. Urinary bladder is normal in appearance. Stomach/Bowel: The appearance of the stomach is normal. No pathologic dilatation of small bowel or colon. Numerous colonic diverticulae are noted, without surrounding inflammatory changes to suggest an acute diverticulitis  at this time. Status post appendectomy. Vascular/Lymphatic: Aortic atherosclerosis, without evidence of aneurysm or dissection in the abdominal or pelvic vasculature. Vascular findings and measurements pertinent to potential TAVR procedure, as detailed below. No lymphadenopathy noted in the abdomen or pelvis. Reproductive: Status post hysterectomy. Ovaries are not confidently identified may be surgically absent or atrophic. Other: Tiny umbilical hernia containing only omental fat. No significant volume of ascites. No pneumoperitoneum. Musculoskeletal: There are no aggressive appearing lytic or blastic lesions noted in the visualized portions of the skeleton. VASCULAR MEASUREMENTS PERTINENT TO TAVR: AORTA: Minimal Aortic Diameter-12 x 11 mm Severity of Aortic Calcification-severe RIGHT PELVIS: Right Common Iliac Artery - Minimal Diameter-8.0 x 8.1 mm Tortuosity-mild Calcification-moderate Right External Iliac Artery - Minimal Diameter-6.9 x 6.5 mm Tortuosity-moderate Calcification-none Right Common Femoral Artery - Minimal Diameter-7.0 x 6.6 mm Tortuosity-mild Calcification-mild LEFT PELVIS: Left Common Iliac Artery - Minimal Diameter-9.9 x 8.8 mm Tortuosity-mild Calcification-mild Left External Iliac Artery - Minimal Diameter-7.7 x 7.1 mm Tortuosity-moderate Calcification-none Left Common Femoral Artery - Minimal Diameter-7.1 x 5.7 mm Tortuosity-mild Calcification-mild Review of the MIP images confirms the above findings. IMPRESSION: 1. Vascular findings and  measurements pertinent to potential TAVR procedure, as detailed above. 2. Severe thickening calcification of the aortic valve, compatible with reported clinical history of severe aortic stenosis. 3. Trace amount of pericardial fluid and/or thickening, unlikely to be of any hemodynamic significance at this time. No pericardial calcification. 4. Mild cardiomegaly with evidence of mild interstitial pulmonary edema and small bilateral pleural effusions; imaging findings that could suggest mild congestive heart failure. 5. Tiny 1-2 mm pulmonary nodules scattered throughout the periphery of the lungs bilaterally, nonspecific, but statistically likely benign. No follow-up needed if patient is low-risk (and has no known or suspected primary neoplasm). Non-contrast chest CT can be considered in 12 months if patient is high-risk. This recommendation follows the consensus statement: Guidelines for Management of Incidental Pulmonary Nodules Detected on CT Images: From the Fleischner Society 2017; Radiology 2017; 284:228-243. 6. 1.1 x 1.0 cm intermediate attenuation lesion in the inferior aspect of the uncinate process of the pancreas. This is nonspecific, but favored to represent a benign lesions such as a small pancreatic pseudocyst. Follow-up evaluation with pancreatic protocol CT scan or abdominal MRI with and without IV gadolinium with MRCP in 2 years is recommended to ensure the stability of this lesion. This recommendation follows ACR consensus guidelines: Management of Incidental Pancreatic Cysts: A White Paper of the ACR Incidental Findings Committee. J Am Coll Radiol 1610;96:045-409. 7. Colonic diverticulosis without evidence of acute diverticulitis at this time. 8. Additional incidental findings, as above. Electronically Signed   By: Vinnie Langton M.D.   On: 02/07/2020 13:56   VAS US CAROTID  Result Date: 02/25/2020 Carotid Arterial Duplex Study Indications:   Pre-surgical evaluation. Risk Factors:   Hypertension, hyperlipidemia, Diabetes, coronary artery disease. Other Factors: Severe aortic stenosis. Performing Technologist: Maudry Mayhew MHA, RDMS, RVT, RDCS  Examination Guidelines: A complete evaluation includes B-mode imaging, spectral Doppler, color Doppler, and power Doppler as needed of all accessible portions of each vessel. Bilateral testing is considered an integral part of a complete examination. Limited examinations for reoccurring indications may be performed as noted.  Right Carotid Findings: +----------+-------+-------+--------+---------------------------------+--------+           PSV    EDV    StenosisPlaque Description               Comments           cm/s   cm/s                                                     +----------+-------+-------+--------+---------------------------------+--------+  CCA Prox  89     13                                                       +----------+-------+-------+--------+---------------------------------+--------+ CCA Distal90     16             heterogenous, irregular and                                               calcific                                  +----------+-------+-------+--------+---------------------------------+--------+ ICA Prox  117    20             smooth and heterogenous                   +----------+-------+-------+--------+---------------------------------+--------+ ICA Distal129    24                                                       +----------+-------+-------+--------+---------------------------------+--------+ ECA       187                   heterogenous, irregular and                                               calcific                                  +----------+-------+-------+--------+---------------------------------+--------+ +----------+--------+-------+----------------+-------------------+           PSV cm/sEDV cmsDescribe        Arm Pressure  (mmHG) +----------+--------+-------+----------------+-------------------+ Subclavian129            Multiphasic, WNL                    +----------+--------+-------+----------------+-------------------+ +---------+--------+--+--------+--+---------+ VertebralPSV cm/s72EDV cm/s15Antegrade +---------+--------+--+--------+--+---------+  Left Carotid Findings: +----------+--------+--------+--------+---------------------+------------------+           PSV cm/sEDV cm/sStenosisPlaque Description   Comments           +----------+--------+--------+--------+---------------------+------------------+ CCA Prox  105     16                                   intimal thickening +----------+--------+--------+--------+---------------------+------------------+ CCA Distal78      13              smooth and  heterogenous                            +----------+--------+--------+--------+---------------------+------------------+ ICA Prox  67      16              smooth, heterogenous                                                      and calcific                            +----------+--------+--------+--------+---------------------+------------------+ ICA Distal94      23                                                      +----------+--------+--------+--------+---------------------+------------------+ ECA       140                     smooth, calcific and                                                      heterogenous                            +----------+--------+--------+--------+---------------------+------------------+ +----------+--------+--------+----------------+-------------------+           PSV cm/sEDV cm/sDescribe        Arm Pressure (mmHG) +----------+--------+--------+----------------+-------------------+ [id number]             Multiphasic, WNL                     +----------+--------+--------+----------------+-------------------+ +---------+--------+--+--------+--+---------+ VertebralPSV cm/s77EDV cm/s17Antegrade +---------+--------+--+--------+--+---------+   Summary: Right Carotid: Velocities in the right ICA are consistent with a 1-39% stenosis. Left Carotid: Velocities in the left ICA are consistent with a 1-39% stenosis. Vertebrals:  Bilateral vertebral arteries demonstrate antegrade flow. Subclavians: Normal flow hemodynamics were seen in bilateral subclavian              arteries. *See table(s) above for measurements and observations.  Electronically signed by Deitra Mayo MD on 02/25/2020 at 2:18:03 PM.    Final    ECHOCARDIOGRAM LIMITED  Result Date: 03/03/2020    ECHOCARDIOGRAM LIMITED REPORT   Patient Name:   Sophia Ward Date of Exam: 03/03/2020 Medical Rec #:  [id number]          Height:       62.5 in Accession #:    [id number]         Weight:       170.0 lb Date of Birth:  01/26/1933         BSA:          1.795 m Patient Age:    24 years           BP:           164/58 mmHg Patient Gender: F  HR:           81 bpm. Exam Location:  Inpatient Procedure: Cardiac Doppler, Color Doppler and Limited Echo Indications:     ; I35.0 Nonrheumatic aortic (valve) stenosis  History:         Patient has prior history of Echocardiogram examinations, most                  recent 10/24/2019. CAD, Aortic Valve Disease,                  Signs/Symptoms:Hypotension; Risk Factors:Dyslipidemia and                  Hypertension. Severe aortic stenosis.  Sonographer:     Roseanna Rainbow RDCS Referring Phys:  Tse Bonito Diagnosing Phys: Jenkins Rouge MD IMPRESSIONS  1. Left ventricular ejection fraction, by estimation, is 60 to 65%. The left ventricle has normal function. There is mild left ventricular hypertrophy.  2. Left atrial size was moderately dilated.  3. Mild mitral valve regurgitation. Severe mitral annular calcification.  4. Tricuspid valve  regurgitation is moderate.  5. Pre TAVR: Tri leaflet AV severe calcification /stenosis mean gradient 43 mmHg peak 76 mmHg AVR 0.7 cm2         Post TAVR well placed 23 mm Sapien 3 valve mean gradient 5 peak 12 mmHg with no PVL AVA 1.9 cm2. Aortic valve regurgitation is mild. Severe aortic valve stenosis.  6. There is severely elevated pulmonary artery systolic pressure. FINDINGS  Left Ventricle: Left ventricular ejection fraction, by estimation, is 60 to 65%. The left ventricle has normal function. The left ventricular internal cavity size was normal in size. There is mild left ventricular hypertrophy. Right Ventricle: There is severely elevated pulmonary artery systolic pressure. The tricuspid regurgitant velocity is 3.61 m/s, and with an assumed right atrial pressure of 15 mmHg, the estimated right ventricular systolic pressure is 47.0 mmHg. Left Atrium: Left atrial size was moderately dilated. Pericardium: There is no evidence of pericardial effusion. Mitral Valve: There is moderate thickening of the mitral valve leaflet(s). There is moderate calcification of the mitral valve leaflet(s). Severe mitral annular calcification. Mild mitral valve regurgitation. MV peak gradient, 17.0 mmHg. The mean mitral valve gradient is 4.0 mmHg. Tricuspid Valve: Tricuspid valve regurgitation is moderate. Aortic Valve: Pre TAVR: Tri leaflet AV severe calcification /stenosis mean gradient 43 mmHg peak 76 mmHg AVR 0.7 cm2 Post TAVR well placed 23 mm Sapien 3 valve mean gradient 5 peak 12 mmHg with no PVL AVA 1.9 cm2. Aortic valve regurgitation is mild. Severe aortic stenosis is present. Aortic valve mean gradient measures 6.0 mmHg. Aortic valve peak gradient measures 12.4  mmHg. Aortic valve area, by VTI measures 1.76 cm. LEFT VENTRICLE PLAX 2D LVOT diam:     1.80 cm LV SV:         71 LV SV Index:   39 LVOT Area:     2.54 cm  LV Volumes (MOD) LV vol d, MOD A2C: 72.3 ml LV vol d, MOD A4C: 53.0 ml LV vol s, MOD A2C: 26.6 ml LV vol s,  MOD A4C: 16.7 ml LV SV MOD A2C:     45.7 ml LV SV MOD A4C:     53.0 ml LV SV MOD BP:      43.3 ml AORTIC VALVE AV Area (Vmax):    1.82 cm AV Area (Vmean):   1.86 cm AV Area (VTI):     1.76 cm AV Vmax:  176.00 cm/s AV Vmean:          117.000 cm/s AV VTI:            0.403 m AV Peak Grad:      12.4 mmHg AV Mean Grad:      6.0 mmHg LVOT Vmax:         126.00 cm/s LVOT Vmean:        85.400 cm/s LVOT VTI:          0.278 m LVOT/AV VTI ratio: 0.69 MITRAL VALVE                TRICUSPID VALVE MV Area (PHT): 3.17 cm     TR Peak grad:   52.1 mmHg MV Peak grad:  17.0 mmHg    TR Vmax:        361.00 cm/s MV Mean grad:  4.0 mmHg MV Vmax:       2.06 m/s     SHUNTS MV Vmean:      94.5 cm/s    Systemic VTI:  0.28 m MV Decel Time: 239 msec     Systemic Diam: 1.80 cm MV E velocity: 168.00 cm/s MV A velocity: 104.00 cm/s MV E/A ratio:  1.62 Jenkins Rouge MD Electronically signed by Jenkins Rouge MD Signature Date/Time: 03/03/2020/11:35:37 AM    Final    Structural Heart Procedure  Result Date: 03/03/2020 See surgical note for result.  CT Angio Abd/Pel w/ and/or w/o  Result Date: 02/07/2020 CLINICAL DATA:  86 year old female with history of severe aortic stenosis. Preprocedural study prior to potential transcatheter aortic valve replacement (TAVR) procedure. EXAM: CT ANGIOGRAPHY CHEST, ABDOMEN AND PELVIS TECHNIQUE: Multidetector CT imaging through the chest, abdomen and pelvis was performed using the standard protocol during bolus administration of intravenous contrast. Multiplanar reconstructed images and MIPs were obtained and reviewed to evaluate the vascular anatomy. CONTRAST:  33mL OMNIPAQUE IOHEXOL 350 MG/ML SOLN COMPARISON:  CT the abdomen and pelvis 10/22/2019. No prior chest CT. FINDINGS: CTA CHEST FINDINGS Cardiovascular: Heart size is mildly enlarged. Trace amount of pericardial fluid and/or thickening, unlikely to be of any hemodynamic significance at this time. No associated pericardial calcification. There  is aortic atherosclerosis, as well as atherosclerosis of the great vessels of the mediastinum and the coronary arteries, including calcified atherosclerotic plaque in the left main, left anterior descending, left circumflex and right coronary arteries. Thickening calcification of the aortic valve. Mediastinum/Lymph Nodes: Multiple prominent borderline enlarged mediastinal and bilateral hilar lymph nodes, nonspecific. Esophagus is unremarkable in appearance. No axillary lymphadenopathy. Lungs/Pleura: A few scattered tiny 1-2 mm pulmonary nodules are noted throughout the periphery of the lungs bilaterally, nonspecific, but statistically likely to represent areas of mucoid impaction within terminal bronchioles. Mild ground-glass attenuation and interlobular septal thickening scattered throughout the lungs bilaterally, likely to reflect a background of mild interstitial pulmonary edema. No confluent consolidative airspace disease. Small bilateral pleural effusions lying dependently. Musculoskeletal/Soft Tissues: Old healed nondisplaced fracture of the inferior aspect of the sternum. There are no aggressive appearing lytic or blastic lesions noted in the visualized portions of the skeleton. CTA ABDOMEN AND PELVIS FINDINGS Hepatobiliary: No suspicious cystic or solid hepatic lesions. Tiny calcified granuloma in the left lobe of the liver incidentally noted. No intra or extrahepatic biliary ductal dilatation. Tiny calcified gallstones lying dependently in the gallbladder. No findings to suggest an acute cholecystitis at this time. Pancreas: In the inferior aspect of the uncinate process of the pancreas there is a 1.1 x 1.0 cm intermediate attenuation (44  HU) lesion (axial image 137 of series 15). No other pancreatic mass. No pancreatic ductal dilatation. No peripancreatic fluid collections or inflammatory changes are noted. Spleen: Unremarkable. Adrenals/Urinary Tract: Bilateral kidneys and right adrenal gland are normal  in appearance. 1.1 x 0.9 cm left adrenal nodule, stable compared to the prior CT examination, previously -11 HU (compatible with an adenoma). No hydroureteronephrosis. Urinary bladder is normal in appearance. Stomach/Bowel: The appearance of the stomach is normal. No pathologic dilatation of small bowel or colon. Numerous colonic diverticulae are noted, without surrounding inflammatory changes to suggest an acute diverticulitis at this time. Status post appendectomy. Vascular/Lymphatic: Aortic atherosclerosis, without evidence of aneurysm or dissection in the abdominal or pelvic vasculature. Vascular findings and measurements pertinent to potential TAVR procedure, as detailed below. No lymphadenopathy noted in the abdomen or pelvis. Reproductive: Status post hysterectomy. Ovaries are not confidently identified may be surgically absent or atrophic. Other: Tiny umbilical hernia containing only omental fat. No significant volume of ascites. No pneumoperitoneum. Musculoskeletal: There are no aggressive appearing lytic or blastic lesions noted in the visualized portions of the skeleton. VASCULAR MEASUREMENTS PERTINENT TO TAVR: AORTA: Minimal Aortic Diameter-12 x 11 mm Severity of Aortic Calcification-severe RIGHT PELVIS: Right Common Iliac Artery - Minimal Diameter-8.0 x 8.1 mm Tortuosity-mild Calcification-moderate Right External Iliac Artery - Minimal Diameter-6.9 x 6.5 mm Tortuosity-moderate Calcification-none Right Common Femoral Artery - Minimal Diameter-7.0 x 6.6 mm Tortuosity-mild Calcification-mild LEFT PELVIS: Left Common Iliac Artery - Minimal Diameter-9.9 x 8.8 mm Tortuosity-mild Calcification-mild Left External Iliac Artery - Minimal Diameter-7.7 x 7.1 mm Tortuosity-moderate Calcification-none Left Common Femoral Artery - Minimal Diameter-7.1 x 5.7 mm Tortuosity-mild Calcification-mild Review of the MIP images confirms the above findings. IMPRESSION: 1. Vascular findings and measurements pertinent to  potential TAVR procedure, as detailed above. 2. Severe thickening calcification of the aortic valve, compatible with reported clinical history of severe aortic stenosis. 3. Trace amount of pericardial fluid and/or thickening, unlikely to be of any hemodynamic significance at this time. No pericardial calcification. 4. Mild cardiomegaly with evidence of mild interstitial pulmonary edema and small bilateral pleural effusions; imaging findings that could suggest mild congestive heart failure. 5. Tiny 1-2 mm pulmonary nodules scattered throughout the periphery of the lungs bilaterally, nonspecific, but statistically likely benign. No follow-up needed if patient is low-risk (and has no known or suspected primary neoplasm). Non-contrast chest CT can be considered in 12 months if patient is high-risk. This recommendation follows the consensus statement: Guidelines for Management of Incidental Pulmonary Nodules Detected on CT Images: From the Fleischner Society 2017; Radiology 2017; 284:228-243. 6. 1.1 x 1.0 cm intermediate attenuation lesion in the inferior aspect of the uncinate process of the pancreas. This is nonspecific, but favored to represent a benign lesions such as a small pancreatic pseudocyst. Follow-up evaluation with pancreatic protocol CT scan or abdominal MRI with and without IV gadolinium with MRCP in 2 years is recommended to ensure the stability of this lesion. This recommendation follows ACR consensus guidelines: Management of Incidental Pancreatic Cysts: A White Paper of the ACR Incidental Findings Committee. J Am Coll Radiol 9702;63:785-885. 7. Colonic diverticulosis without evidence of acute diverticulitis at this time. 8. Additional incidental findings, as above. Electronically Signed   By: Vinnie Langton M.D.   On: 02/07/2020 13:56   Disposition   Pt is being discharged home today in good condition.  Follow-up Plans & Appointments     Follow-up Information    Eileen Stanford, PA-C.  Go on 03/11/2020.   Specialties: Cardiology, Radiology Why: @  1:30pm, please arrive at least 10 minutes early Contact information: [address] [place] [address] [phone]              Discharge Instructions    Amb Referral to Cardiac Rehabilitation   Complete by: As directed    Diagnosis: Valve Replacement   Valve: Aortic Comment - TAVR   After initial evaluation and assessments completed: Virtual Based Care may be provided alone or in conjunction with Phase 2 Cardiac Rehab based on patient barriers.: Yes   For home use only DME oxygen   Complete by: As directed    Length of Need: 6 Months   Mode or (Route): Nasal cannula   Liters per Minute: 3   Frequency: Continuous (stationary and portable oxygen unit needed)   Oxygen delivery system: Gas      Discharge Medications   Allergies as of 03/04/2020      Reactions   Codeine Nausea And Vomiting   MAKES HER SICK   Glimepiride Other (See Comments)   Can't remember   Jardiance [empagliflozin] Other (See Comments)   Yeast   Metformin And Related Diarrhea   Penicillins Rash      Medication List    TAKE these medications   acetaminophen 650 MG CR tablet Commonly known as: TYLENOL Take 1,300 mg by mouth at bedtime.   amLODipine 10 MG tablet Commonly known as: NORVASC Take 1 tablet (10 mg total) by mouth every morning. What changed: when to take this   aspirin 81 MG chewable tablet Chew 1 tablet (81 mg total) by mouth daily.   atorvastatin 40 MG tablet Commonly known as: LIPITOR Take 1 tablet (40 mg total) by mouth at bedtime.   B-D ULTRAFINE III SHORT PEN 31G X 8 MM Misc Generic drug: Insulin Pen Needle 1 each by Other route in the morning, at noon, in the evening, and at bedtime.   CALCIUM + D PO Take 1 tablet by mouth daily.   clopidogrel 75 MG tablet Commonly known as: PLAVIX Take 4 tablets (300 mg) by mouth this evening, then take 1 tablet (75 mg) by mouth daily   escitalopram  5 MG tablet Commonly known as: LEXAPRO Take 5 mg by mouth daily.   estradiol 0.1 MG/GM vaginal cream Commonly known as: ESTRACE Place 1 Applicatorful vaginally daily as needed (driness).   ferrous sulfate 325 (65 FE) MG tablet Take 1 tablet (325 mg total) by mouth 2 (two) times daily with a meal.   FISH OIL PO Take 1 capsule by mouth daily.   folic acid 1 MG tablet Commonly known as: FOLVITE Take 2 tablets (2 mg total) by mouth daily.   furosemide 20 MG tablet Commonly known as: Lasix Take 1 tablet (20 mg total) by mouth daily.   HumaLOG KwikPen 100 UNIT/ML KiwkPen Generic drug: insulin lispro Inject 6 Units into the skin 3 (three) times daily.   Levemir FlexTouch 100 UNIT/ML FlexPen Generic drug: insulin detemir Inject 14 Units into the skin every morning.   methotrexate 2.5 MG tablet TAKE 5 TABLETS BY MOUTH ONCE A WEEK, CAUTION CHEMOTHERAPY, PROTECT FROM LIGHT What changed:   how much to take  how to take this  when to take this  additional instructions   metoprolol succinate 100 MG 24 hr tablet Commonly known as: TOPROL-XL Take 1 tablet (100 mg total) by mouth every morning. What changed: when to take this   nitroGLYCERIN 0.4 MG SL tablet Commonly known as: NITROSTAT DISSOLVE ONE TABLET  UNDER THE TONGUE EVERY 5 MINUTES AS NEEDED FOR CHEST PAIN.  DO NOT EXCEED A TOTAL OF 3 DOSES IN 15 MINUTES What changed:   how much to take  how to take this  when to take this  reasons to take this   pantoprazole 40 MG tablet Commonly known as: PROTONIX Take 1 tablet (40 mg total) by mouth daily after lunch.   predniSONE 1 MG tablet Commonly known as: DELTASONE TAKE 4 TABLETS BY MOUTH ONCE DAILY WITH BREAKFAST What changed: See the new instructions.   ramipril 10 MG tablet Commonly known as: ALTACE Take 10 mg by mouth 2 (two) times daily.   Vitamin D3 125 MCG (5000 UT) Caps Take 5,000 Units by mouth daily.            Durable Medical Equipment  (From  admission, onward)         Start     Ordered   03/04/20 0000  For home use only DME oxygen       Question Answer Comment  Length of Need 6 Months   Mode or (Route) Nasal cannula   Liters per Minute 3   Frequency Continuous (stationary and portable oxygen unit needed)   Oxygen delivery system Gas      03/04/20 1137              Outstanding Labs/Studies   CBC, BMET  Duration of Discharge Encounter   Greater than 30 minutes including physician time.  Mable Fill, PA-C 03/04/2020, 11:37 AM [phone]  Patient seen, examined. Available data reviewed. Agree with findings, assessment, and plan as outlined by Nell Range, PA-C. On my exam: Vitals:   03/04/20 0439 03/04/20 0738  BP: (!) 153/51 (!) 144/56  Pulse: 77 76  Resp: (!) 22 17  Temp: 98.6 F (37 C) 98.3 F (36.8 C)  SpO2: 95% 95%   Pt is alert and oriented, NAD HEENT: normal Neck: JVP - normal Lungs: CTA bilaterally CV: RRR with 2/6 SEM at the RUSB Abd: soft, NT, Positive BS, no hepatomegaly Ext: no C/C/E, distal pulses intact and equal Skin: warm/dry no rash  Echo reviewed: vigorous/normal LV function, normal TAVR function with mean gradient 20 mmHg and no PVL. Discussed post-procedure recommendations with patient and she understand. HgB stable. Tele without arrhythmia.  Sherren Mocha, M.D. 03/04/2020 11:37 AM

## 2020-03-04 NOTE — Progress Notes (Signed)
Echocardiogram 2D Echocardiogram has been performed.  Oneal Deputy Demond Shallenberger 03/04/2020, 10:32 AM

## 2020-03-04 NOTE — Plan of Care (Signed)

## 2020-03-04 NOTE — Progress Notes (Signed)
Order received to discharge patient.  Telemetry monitor removed and CCMD notified.  PIV access removed.  Home O2 delivered to pt's room.  Discharge instructions, follow up, medications and instructions for their use discussed with patient.

## 2020-03-04 NOTE — Progress Notes (Signed)
CARDIAC REHAB PHASE I   PRE:  Rate/Rhythm: 79 SR  BP:  Supine: 133/48  Sitting:   Standing:    SaO2: 94% 2L  90 %RA  MODE:  Ambulation: 300 ft   POST:  Rate/Rhythm: 100 SR  BP:  Supine: 145/57  Sitting:   Standing:    SaO2: 87%RA  88% 2L  91% 3L [id number] See qualifying note for oxygen. Took 3L to keep sats up walking. Pt still with some DOE and stopped twice to rest. Encouraged slow deep breaths. Assisted to bathroom and then back to bed after walk. Put on 2L for rest. Bed alarm on . Discussed CRP 2 and referred to [place] to update. Pt had order from stent last week.  Encouraged pt to watch her sodium and to use IS.   Graylon Good, RN BSN  03/04/2020 9:37 AM

## 2020-03-04 NOTE — Progress Notes (Signed)
SATURATION QUALIFICATIONS: (This note is used to comply with regulatory documentation for home oxygen)  Patient Saturations on Room Air at Rest = 90%  Patient Saturations on Room Air while Ambulating = 87%  Patient Saturations on 3 Liters of oxygen while Ambulating = 91%  Please briefly explain why patient needs home oxygen:  Took 3L to keep sats up walking

## 2020-03-04 NOTE — TOC Transition Note (Signed)
Transition of Care (TOC) - CM/SW Discharge Note Marvetta Gibbons RN, BSN Transitions of Care Unit 4E- RN Case Manager See Treatment Team for direct phone #    Patient Details  Name: Sophia Ward MRN: [id number] Date of Birth: 22-[name]-1933  Transition of Care [facility]) CM/SW Contact:  Dawayne Patricia, RN Phone Number: 03/04/2020, 12:07 PM   Clinical Narrative:    Pt stable for transition home today, order has been placed for home 02 needs. CM spoke with pt and spouse at the bedside- discussed 02 needs and offered choice for agency- per pt she does not have a preference and is agreeable to using in house provider [facility].  Call made to [facility] with [facility] for home 02 needs - once processed with insurance- portable device will be delivered to room to use for transport home. [facility] will contact pt for deliver of home equipment.    Final next level of care: Home/Self Care Barriers to Discharge: No Barriers Identified   Patient Goals and CMS Choice Patient states their goals for this hospitalization and ongoing recovery are:: return home CMS Medicare.gov Compare Post Acute Care list provided to:: Patient Choice offered to / list presented to : Patient  Discharge Placement                 Home      Discharge Plan and Services   Discharge Planning Services: CM Consult Post Acute Care Choice: Durable Medical Equipment          DME Arranged: Oxygen DME Agency: [facility] Date DME Agency Contacted: 03/04/20 Time DME Agency Contacted: 1206 Representative spoke with at DME Agency: [place]: NA [place] Agency: NA        Social Determinants of Health ([place]) Interventions     Readmission Risk Interventions Readmission Risk Prevention Plan 03/04/2020  Transportation Screening Complete  PCP or Specialist Appt within 3-5 Days Complete  HRI or [place] Complete  Social Work Consult for [place] Planning/Counseling Complete  Palliative Care Screening Not  Applicable  Medication Review Press photographer) Complete  Some recent data might be hidden

## 2020-03-05 ENCOUNTER — Telehealth: Payer: Self-pay | Admitting: Physician Assistant

## 2020-03-05 NOTE — Telephone Encounter (Signed)
  [facility]   MULTIDISCIPLINARY HEART VALVE TEAM   Patient contacted regarding discharge from [facility] on 10/6  Patient understands to follow up with provider Katie Koralyn Prestage on 10/13 at [address]  Patient understands discharge instructions? yes Patient understands medications and regimen? yes Patient understands to bring all medications to this visit? yes  Matheo Rathbone PA-C  [facility]     

## 2020-03-06 ENCOUNTER — Telehealth (HOSPITAL_COMMUNITY): Payer: Self-pay

## 2020-03-06 NOTE — Telephone Encounter (Signed)
Called patient to see if she is interested in the Cardiac Rehab Program. Patient expressed interest. Explained scheduling process and went over insurance, patient verbalized understanding. Will contact patient for scheduling once f/u has been completed. 

## 2020-03-10 NOTE — Progress Notes (Signed)
[facility] [name]                                       Cardiology Office Note    Date:  03/11/2020   ID:  Sophia Ward, DOB 1933/03/12, MRN [id number]  PCP:  [name], MD  Cardiologist:  Irish Lack, MD / Dr. Burt Knack & Dr. Roxy Manns (TAVR)  CC: TOC s/p TAVR  History of Present Illness:  Sophia Ward is a 86 y.o. female with a history of CAD s/p DES to LAD x1 (2004), chronic back issues, rheumatoid arthritis (on MTX and prednisone), multifactorial anemia, PAF previously on Eliquis, DMT2, HTN, HLD and severe aortic stenosis s/p TAVR who presents to clinic for follow up.  The patient's cardiac history dates back to 2004 when she presented with an acute MI. She was treated with PCI and stenting of the LAD. The patient had early in-stent restenosis and underwent repeat PCI and stenting around 5 months later.  She has been followed by Dr.Varanasiand noted to have at least moderate aortic stenosis by echo in 2019: EF 60%, mean gradient of 22 mm Hg.  She was admitted in May 2021 with acute blood loss anemia with a hemoglobin down to 7.9. She underwent EGD and colonoscopy which were unremarkable except for a few ulcers in the terminal ileum. During this admission she was also noted to go into paroxysmal atrial fibrillation. This was a new diagnosis for her. She was started on Eliquis and Plavix was discontinued. 2D echo during this admission revealed normal LV function withprogression of her aortic stenosis to severe: mean gradient 32 mmHg, peak gradient 56.9 mm hg, AVA 0.80 cm2, DVI 0.24, SVI 31.Plans were made to proceed with [facility].  She was then readmitted in 12/2019 forUTI andacute hypoxic respiratory failure due to left lower lobe pneumonia and acute decompensated diastolic heart failure.Covid testwas negative.  The patient eventually underwent left and right heart catheterization on 01/29/2020 with Dr.Varansi. This  revealed severe proximal two-vessel CAD with ostial stenosis of the RCA and 70% proximal stenosis of the LAD prior to widely patent previously placed stents. There was nonobstructive disease in the left circumflex. Mean transvalvular gradient across the aortic valve was measured 16.8 mmHg on pullback corresponding to aortic valve area calculated 1.59 cm. Pulmonary artery pressures were moderately elevated.On 02/25/20 she underwent staged successful PCI of the ostial RCA, followed byorbital atherectomy and PCI ofthe left main/LAD.Struts to circumflex opened with small balloon as well since the circumflex was jailed. She had worsening of her chronic anemia with a Hg down to 7.2 and was transfused. Her case was discussed by the multidisciplinary valve team as well as her hematologist, Dr. Irene Limbo. It was decided to proceed with TAVR and follow Hg closely.   She underwent successful TAVR with a 23 mm Edwards Sapien 3 Ultra THV via the TF approach on 03/03/20. Post operative echo showed EF 60%, normally functioning TAVR with a mean gradient 20 mm hg and no PVL. She was noted to be hypoxic and discharged home on 02 and a short course of lasix. She was resumed on aspirin and plavix. Given anemia, she was left of Eliquis.  Today she presents to clinic for follow up. Hasn't had to use oxygen at all. Doing so much better since TAVR. Feels much improved. No CP or SOB. No LE edema, orthopnea or PND.  Groins healing well. No dizziness or syncope. No blood in stool or urine. No palpitations. She does have a productive cough that has been ongoing since admission for PNA. Wants to know when she can have spinal injection for back pain.   Past Medical History:  Diagnosis Date  . Allergic rhinitis   . Anemia 09/2019  . Arthritis    hands  . BPPV (benign paroxysmal positional vertigo)   . Coronary artery disease   . Diabetes mellitus without complication ([place])   . Essential hypertension, benign 01/30/2014  . GERD  (gastroesophageal reflux disease)   . Hypertension   . Lesion of pancreas   . Mixed hyperlipidemia 01/30/2014  . Myocardial infarction ([place]) 2004   mild MI-no damage- had stents  . Osteopenia   . Post-menopausal   . Pulmonary nodules   . Rheumatoid arthritis ([place])   . S/P TAVR (transcatheter aortic valve replacement) 03/03/2020   s/p TAVR with a 23 mm Edwards S3U via the TF approach by Drs Burt Knack and Roxy Manns  . Severe aortic stenosis   . Spinal stenosis   . Stenosing tenosynovitis     Past Surgical History:  Procedure Laterality Date  . ABDOMINAL HYSTERECTOMY    . APPENDECTOMY    . BIOPSY  10/25/2019   Procedure: BIOPSY;  Surgeon: Otis Brace, MD;  Location: [place];  Service: Gastroenterology;;  . BIOPSY  10/26/2019   Procedure: BIOPSY;  Surgeon: Otis Brace, MD;  Location: [place] [facility];  Service: Gastroenterology;;  . CARDIAC CATHETERIZATION  2004  . carpel tunnel    . COLONOSCOPY WITH PROPOFOL N/A 10/30/2012   Procedure: COLONOSCOPY WITH PROPOFOL;  Surgeon: Garlan Fair, MD;  Location: [facility];  Service: Endoscopy;  Laterality: N/A;  . COLONOSCOPY WITH PROPOFOL N/A 10/26/2019   Procedure: COLONOSCOPY WITH PROPOFOL;  Surgeon: Otis Brace, MD;  Location: [place];  Service: Gastroenterology;  Laterality: N/A;  . CORONARY ANGIOPLASTY  2004  . CORONARY ATHERECTOMY N/A 02/21/2020   Procedure: CORONARY ATHERECTOMY;  Surgeon: Jettie Booze, MD;  Location: [place] [facility];  Service: Cardiovascular;  Laterality: N/A;  . CORONARY STENT INTERVENTION N/A 02/21/2020   Procedure: CORONARY STENT INTERVENTION;  Surgeon: Jettie Booze, MD;  Location: [place] [facility];  Service: Cardiovascular;  Laterality: N/A;  . DILATION AND CURETTAGE OF UTERUS    . ESOPHAGOGASTRODUODENOSCOPY N/A 10/25/2019   Procedure: ESOPHAGOGASTRODUODENOSCOPY (EGD);  Surgeon: Otis Brace, MD;  [facility];  Service: Gastroenterology;  Laterality: N/A;  .  ESOPHAGOGASTRODUODENOSCOPY (EGD) WITH PROPOFOL N/A 10/30/2012   Procedure: ESOPHAGOGASTRODUODENOSCOPY (EGD) WITH PROPOFOL;  Surgeon: Garlan Fair, MD;  Location: [facility];  Service: Endoscopy;  Laterality: N/A;  . EYE SURGERY     bilateral cataract with lens implant  . EYE SURGERY    . INJECTION OF SILICONE OIL Left 0/24/0973   Procedure: Injection Of Silicone Oil;  Surgeon: Jalene Mullet, MD;  Location: [place];  Service: Ophthalmology;  Laterality: Left;  . INTRAVASCULAR PRESSURE WIRE/FFR STUDY N/A 01/29/2020   Procedure: INTRAVASCULAR PRESSURE WIRE/FFR STUDY;  Surgeon: Jettie Booze, MD;  Location: [place] CV [facility];  Service: Cardiovascular;  Laterality: N/A;  . INTRAVASCULAR ULTRASOUND/IVUS N/A 02/21/2020   Procedure: Intravascular Ultrasound/IVUS;  Surgeon: Jettie Booze, MD;  Location: [place] [facility];  Service: Cardiovascular;  Laterality: N/A;  . left shouler surgery    . PHOTOCOAGULATION WITH LASER Left 02/12/2019   Procedure: Photocoagulation With Laser;  Surgeon: Jalene Mullet, MD;  Location: [place];  Service: Ophthalmology;  Laterality: Left;  . 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 02/12/2019   Procedure: REPAIR OF COMPLEX TRACTION RETINAL DETACHMENT;  Surgeon: Jalene Mullet, MD;  Location: [place];  Service: Ophthalmology;  Laterality: Left;  . RIGHT/LEFT HEART CATH AND CORONARY ANGIOGRAPHY N/A 01/29/2020   Procedure: RIGHT/LEFT HEART CATH AND CORONARY ANGIOGRAPHY;  Surgeon: Jettie Booze, MD;  Location: [place] [facility];  Service: Cardiovascular;  Laterality: N/A;  . TEE WITHOUT CARDIOVERSION N/A 03/03/2020   Procedure: TRANSESOPHAGEAL ECHOCARDIOGRAM (TEE);  Surgeon: Sherren Mocha, MD;  Location: [place];  Service: Open Heart Surgery;  Laterality: N/A;  . TONSILLECTOMY    . TRANSCATHETER AORTIC VALVE REPLACEMENT, TRANSFEMORAL  03/03/2020  . TRANSCATHETER AORTIC VALVE REPLACEMENT, TRANSFEMORAL N/A 03/03/2020   Procedure: TRANSCATHETER AORTIC  VALVE REPLACEMENT, TRANSFEMORAL USING EDWARDS SAPIEN 3 23 MM AORTIC VALVE.;  Surgeon: Sherren Mocha, MD;  Location: [place];  Service: Open Heart Surgery;  Laterality: N/A;  . VITRECTOMY 25 GAUGE WITH SCLERAL BUCKLE Left 02/12/2019   Procedure: Vitrectomy 25 Gauge With Scleral Buckle;  Surgeon: Jalene Mullet, MD;  Location: [place];  Service: Ophthalmology;  Laterality: Left;    Current Medications: Outpatient Medications Prior to Visit  Medication Sig Dispense Refill  . acetaminophen (TYLENOL) 650 MG CR tablet Take 1,300 mg by mouth at bedtime.    Marland Kitchen amLODipine (NORVASC) 10 MG tablet Take 1 tablet (10 mg total) by mouth every morning. 90 tablet 3  . aspirin 81 MG chewable tablet Chew 1 tablet (81 mg total) by mouth daily. 30 tablet 2  . atorvastatin (LIPITOR) 40 MG tablet Take 1 tablet (40 mg total) by mouth at bedtime. 90 tablet 3  . B-D ULTRAFINE III SHORT PEN 31G X 8 MM MISC 1 each by Other route in the morning, at noon, in the evening, and at bedtime.     . Calcium Carbonate-Vitamin D (CALCIUM + D PO) Take 1 tablet by mouth daily.     . Cholecalciferol (VITAMIN D3) 125 MCG (5000 UT) CAPS Take 5,000 Units by mouth daily.     . clopidogrel (PLAVIX) 75 MG tablet Take 4 tablets (300 mg) by mouth this evening, then take 1 tablet (75 mg) by mouth daily 90 tablet 0  . desonide (DESOWEN) 0.05 % cream Apply 1 application topically 2 (two) times daily as needed.    Marland Kitchen escitalopram (LEXAPRO) 5 MG tablet Take 5 mg by mouth daily.    Marland Kitchen estradiol (ESTRACE) 0.1 MG/GM vaginal cream Place 1 Applicatorful vaginally daily as needed (driness).     . ferrous sulfate 325 (65 FE) MG tablet Take 1 tablet (325 mg total) by mouth 2 (two) times daily with a meal. 60 tablet 1  . folic acid (FOLVITE) 1 MG tablet Take 2 tablets (2 mg total) by mouth daily. 180 tablet 2  . furosemide (LASIX) 20 MG tablet Take 1 tablet (20 mg total) by mouth daily. 5 tablet 0  . insulin lispro (HUMALOG KWIKPEN) 100 UNIT/ML KiwkPen Inject 6  Units into the skin 3 (three) times daily.     Marland Kitchen ketorolac (ACULAR) 0.4 % SOLN Place 1 drop into the left eye 3 (three) times daily. For 3 months    . LEVEMIR FLEXTOUCH 100 UNIT/ML Pen Inject 14 Units into the skin every morning.   0  . methotrexate 2.5 MG tablet TAKE 5 TABLETS BY MOUTH ONCE A WEEK, CAUTION CHEMOTHERAPY, PROTECT FROM LIGHT 60 tablet 0  . metoprolol succinate (TOPROL-XL) 100 MG 24 hr tablet Take 1 tablet (100  mg total) by mouth every morning. 90 tablet 3  . nitroGLYCERIN (NITROSTAT) 0.4 MG SL tablet DISSOLVE ONE TABLET UNDER THE TONGUE EVERY 5 MINUTES AS NEEDED FOR CHEST PAIN.  DO NOT EXCEED A TOTAL OF 3 DOSES IN 15 MINUTES 25 tablet 5  . Omega-3 Fatty Acids (FISH OIL PO) Take 1 capsule by mouth daily.     . pantoprazole (PROTONIX) 40 MG tablet Take 1 tablet (40 mg total) by mouth daily after lunch. 30 tablet 1  . predniSONE (DELTASONE) 1 MG tablet TAKE 4 TABLETS BY MOUTH ONCE DAILY WITH BREAKFAST 360 tablet 0  . ramipril (ALTACE) 10 MG tablet Take 10 mg by mouth 2 (two) times daily.      No facility-administered medications prior to visit.     Allergies:   Codeine, Glimepiride, Jardiance [empagliflozin], Metformin and related, and Penicillins   Social History   Socioeconomic History  . Marital status: Married    Spouse name: Not on file  . Number of children: Not on file  . Years of education: Not on file  . Highest education level: Not on file  Occupational History  . Not on file  Tobacco Use  . Smoking status: Never Smoker  . Smokeless tobacco: Never Used  Vaping Use  . Vaping Use: Never used  Substance and Sexual Activity  . Alcohol use: No  . Drug use: No  . Sexual activity: Not Currently  Other Topics Concern  . Not on file  Social History Narrative  . Not on file   Social Determinants of Health   Financial Resource Strain:   . Difficulty of Paying Living Expenses: Not on file  Food Insecurity:   . Worried About Charity fundraiser in the Last Year:  Not on file  . Ran Out of Food in the Last Year: Not on file  Transportation Needs:   . Lack of Transportation (Medical): Not on file  . Lack of Transportation (Non-Medical): Not on file  Physical Activity:   . Days of Exercise per Week: Not on file  . Minutes of Exercise per Session: Not on file  Stress:   . Feeling of Stress : Not on file  Social Connections:   . Frequency of Communication with Friends and Family: Not on file  . Frequency of Social Gatherings with Friends and Family: Not on file  . Attends Religious Services: Not on file  . Active Member of Clubs or Organizations: Not on file  . Attends Archivist Meetings: Not on file  . Marital Status: Not on file     Family History:  The patient's family history includes Arthritis in her daughter; CAD in her father and mother; Heart Problems in her brother; Heart attack in her mother.     ROS:   Please see the history of present illness.    ROS All other systems reviewed and are negative.   PHYSICAL EXAM:   VS:  BP (!) 138/56   Pulse 70   Ht 5\' 2"  (1.575 m)   Wt 169 lb 3.2 oz (76.7 kg)   SpO2 95%   BMI 30.95 kg/m    GEN: Well nourished, well developed, in no acute distress HEENT: normal Neck: no JVD or masses Cardiac: RRR; no murmurs, rubs, or gallops,no edema  Respiratory:  clear to auscultation bilaterally, normal work of breathing GI: soft, nontender, nondistended, + BS MS: no deformity or atrophy Skin: warm and dry, no rash Neuro:  Alert and Oriented x 3, Strength  and sensation are intact Psych: euthymic mood, full affect   Wt Readings from Last 3 Encounters:  03/11/20 169 lb 3.2 oz (76.7 kg)  03/04/20 171 lb 1.2 oz (77.6 kg)  02/28/20 170 lb 9.6 oz (77.4 kg)      Studies/Labs Reviewed:   EKG:  EKG is NOT ordered today.    Recent Labs: 01/13/2020: TSH 1.118 02/28/2020: ALT 13; B Natriuretic Peptide 427.7 03/04/2020: BUN 12; Creatinine, Ser 1.05; Hemoglobin 8.9; Magnesium 1.5; Platelets 280;  Potassium 3.9; Sodium 136   Lipid Panel    Component Value Date/Time   CHOL 121 02/25/2020 0223   TRIG 76 02/25/2020 0223   HDL 49 02/25/2020 0223   CHOLHDL 2.5 02/25/2020 0223   VLDL 15 02/25/2020 0223   LDLCALC 57 02/25/2020 0223    Additional studies/ records that were reviewed today include:  TAVR OPERATIVE NOTE   Date of Procedure:03/03/2020  Preoperative Diagnosis:Severe Aortic Stenosis   Postoperative Diagnosis:Same   Procedure:   Transcatheter Aortic Valve Replacement - PercutaneousRightTransfemoral Approach Edwards Sapien 3 Ultra THV (size 74mm, model # 9750TFX, serial # [id number])  Co-Surgeons:Clarence H. Roxy Manns, MD and Sherren Mocha, MD  Anesthesiologist:Thomas Fransisco Beau, MD  Echocardiographer:Peter Johnsie Cancel, MD  Pre-operative Echo Findings: ? Severe aortic stenosis ? Normalleft ventricular systolic function  Post-operative Echo Findings: ? Noparavalvular leak ? Normalleft ventricular systolic function _____________  Echo 03/04/20: IMPRESSIONS  1. Left ventricular ejection fraction, by estimation, is 60 to 65%. The  left ventricle has normal function.  2. Left atrial size was mildly dilated.  3. The mitral valve is abnormal. Mild mitral valve regurgitation. Severe  mitral annular calcification.  4. Post TAVR with 23 mm Sapien 3 valve no PVL Baseline gradients somewhat  high with AVA only 1.2 cm2 and DVI 0.42. The aortic valve has been  repaired/replaced. Aortic valve regurgitation is not visualized.  5. There is moderately elevated pulmonary artery systolic pressure.  6. The inferior vena cava is dilated in size with >50% respiratory variability, suggesting right atrial pressure of 8 mmHg.   ASSESSMENT & PLAN:   Severe AS s/p TAVR:groins healing well.  Feeling much better since TAVR.  Continue on aspirin and plavix. SBE  prophylaxis discussed; I have RX'd azithromycin due to a PCN allergy. I will see her back next month for follow up and echo.   CAD: s/p recent PCI on 9/24. Continue aspirin and plavix, BB and statin  PAF: sounds regular on exam. Continue off Eliquis given ongoing anemia and need for DAPT.  Chronic multifactorial anemia: check CBC today.   HTN: BP with good control today. No changes made.   Chronic diastolic CHF: appears euvolemic. Finished 5 day course of lasix post operatively. Will check BMET today  Incidental findings: pre TAVR CT showed  Tiny 1-2 mm pulmonary nodules scattered throughout the periphery of the lungs bilaterally, nonspecific, but statistically likely benign. No follow-up needed if patient is low-risk (and has no known or suspected primary neoplasm). Non-contrast chest CT can be considered in 12 months if patient is high-risk. This was not discussed today.   1.1 x 1.0 cm intermediate attenuation lesion in the inferior aspect of the uncinate process of the pancreas. This is nonspecific, but favored to represent a benign lesions such as a small pancreatic pseudocyst. Follow-up evaluation with pancreatic protocol CT scan or abdominal MRI with and without IV gadolinium with MRCP in 2 years is recommended to ensure the stability of this lesion. This was not discussed today.     Medication  Adjustments/Labs and Tests Ordered: Current medicines are reviewed at length with the patient today.  Concerns regarding medicines are outlined above.  Medication changes, Labs and Tests ordered today are listed in the Patient Instructions below. Patient Instructions  Medication Instructions:  No changes today *If you need a refill on your cardiac medications before your next appointment, please call your pharmacy*   Lab Work: Today: BMET, CBC If you have labs (blood work) drawn today and your tests are completely normal, you will receive your results only by: Marland Kitchen MyChart Message (if  you have MyChart) OR . A paper copy in the mail If you have any lab test that is abnormal or we need to change your treatment, we will call you to review the results.   Testing/Procedures: ECHO as scheduled.   Follow-Up: As scheduled  Other Instructions Prescription provided to discontinue home oxygen.     Signed, Angelena Form, PA-C  03/11/2020 2:26 PM    [facility] [address], [place], [place]  [address] Phone: [phone]; Fax: [phone]

## 2020-03-11 ENCOUNTER — Ambulatory Visit (INDEPENDENT_AMBULATORY_CARE_PROVIDER_SITE_OTHER): Payer: Medicare Other | Admitting: Physician Assistant

## 2020-03-11 ENCOUNTER — Encounter: Payer: Self-pay | Admitting: Physician Assistant

## 2020-03-11 ENCOUNTER — Other Ambulatory Visit: Payer: Self-pay

## 2020-03-11 VITALS — BP 138/56 | HR 70 | Ht 62.0 in | Wt 169.2 lb

## 2020-03-11 DIAGNOSIS — D649 Anemia, unspecified: Secondary | ICD-10-CM

## 2020-03-11 DIAGNOSIS — K869 Disease of pancreas, unspecified: Secondary | ICD-10-CM

## 2020-03-11 DIAGNOSIS — I480 Paroxysmal atrial fibrillation: Secondary | ICD-10-CM | POA: Diagnosis not present

## 2020-03-11 DIAGNOSIS — I10 Essential (primary) hypertension: Secondary | ICD-10-CM | POA: Diagnosis not present

## 2020-03-11 DIAGNOSIS — I5032 Chronic diastolic (congestive) heart failure: Secondary | ICD-10-CM | POA: Diagnosis not present

## 2020-03-11 DIAGNOSIS — Z952 Presence of prosthetic heart valve: Secondary | ICD-10-CM | POA: Diagnosis not present

## 2020-03-11 DIAGNOSIS — R918 Other nonspecific abnormal finding of lung field: Secondary | ICD-10-CM

## 2020-03-11 DIAGNOSIS — I2510 Atherosclerotic heart disease of native coronary artery without angina pectoris: Secondary | ICD-10-CM

## 2020-03-11 MED ORDER — AZITHROMYCIN 500 MG PO TABS: 500.0000 mg | ORAL_TABLET | ORAL | 2 refills | Status: DC

## 2020-03-11 NOTE — Progress Notes (Signed)
PLan on 3-6 months of Plavix if her Hbg stays up.  Would ideally wait 3 months for spinal injection.  Will see about Eliquis based on Hbg.  THanks.  JV

## 2020-03-11 NOTE — Patient Instructions (Signed)
Medication Instructions:  No changes today *If you need a refill on your cardiac medications before your next appointment, please call your pharmacy*   Lab Work: Today: BMET, CBC If you have labs (blood work) drawn today and your tests are completely normal, you will receive your results only by: Marland Kitchen MyChart Message (if you have MyChart) OR . A paper copy in the mail If you have any lab test that is abnormal or we need to change your treatment, we will call you to review the results.   Testing/Procedures: ECHO as scheduled.   Follow-Up: As scheduled  Other Instructions Prescription provided to discontinue home oxygen.

## 2020-03-12 LAB — CBC
Hematocrit: 31.9 % — ABNORMAL LOW (ref 34.0–46.6)
Hemoglobin: 10.2 g/dL — ABNORMAL LOW (ref 11.1–15.9)
MCH: 27.9 pg (ref 26.6–33.0)
MCHC: 32 g/dL (ref 31.5–35.7)
MCV: 87 fL (ref 79–97)
Platelets: 389 10*3/uL (ref 150–450)
RBC: 3.65 x10E6/uL — ABNORMAL LOW (ref 3.77–5.28)
RDW: 16.6 % — ABNORMAL HIGH (ref 11.7–15.4)
WBC: 13.1 10*3/uL — ABNORMAL HIGH (ref 3.4–10.8)

## 2020-03-12 LAB — BASIC METABOLIC PANEL
BUN/Creatinine Ratio: 17 (ref 12–28)
BUN: 17 mg/dL (ref 8–27)
CO2: 24 mmol/L (ref 20–29)
Calcium: 9.8 mg/dL (ref 8.7–10.3)
Chloride: 103 mmol/L (ref 96–106)
Creatinine, Ser: 1 mg/dL (ref 0.57–1.00)
GFR calc Af Amer: 60 mL/min/{1.73_m2} (ref >59)
GFR calc non Af Amer: 52 mL/min/{1.73_m2} — ABNORMAL LOW (ref >59)
Glucose: 102 mg/dL — ABNORMAL HIGH (ref 65–99)
Potassium: 4.5 mmol/L (ref 3.5–5.2)
Sodium: 141 mmol/L (ref 134–144)

## 2020-03-19 DIAGNOSIS — Z23 Encounter for immunization: Secondary | ICD-10-CM | POA: Diagnosis not present

## 2020-04-02 ENCOUNTER — Ambulatory Visit (HOSPITAL_COMMUNITY): Payer: Medicare Other | Attending: Cardiology

## 2020-04-02 ENCOUNTER — Ambulatory Visit (INDEPENDENT_AMBULATORY_CARE_PROVIDER_SITE_OTHER): Payer: Medicare Other | Admitting: Physician Assistant

## 2020-04-02 ENCOUNTER — Encounter: Payer: Self-pay | Admitting: Physician Assistant

## 2020-04-02 ENCOUNTER — Other Ambulatory Visit: Payer: Self-pay

## 2020-04-02 VITALS — BP 140/70 | HR 68 | Ht 62.0 in | Wt 165.2 lb

## 2020-04-02 DIAGNOSIS — I10 Essential (primary) hypertension: Secondary | ICD-10-CM | POA: Diagnosis not present

## 2020-04-02 DIAGNOSIS — D649 Anemia, unspecified: Secondary | ICD-10-CM

## 2020-04-02 DIAGNOSIS — I480 Paroxysmal atrial fibrillation: Secondary | ICD-10-CM | POA: Diagnosis not present

## 2020-04-02 DIAGNOSIS — Z952 Presence of prosthetic heart valve: Secondary | ICD-10-CM | POA: Diagnosis not present

## 2020-04-02 DIAGNOSIS — I2510 Atherosclerotic heart disease of native coronary artery without angina pectoris: Secondary | ICD-10-CM | POA: Diagnosis not present

## 2020-04-02 DIAGNOSIS — R918 Other nonspecific abnormal finding of lung field: Secondary | ICD-10-CM | POA: Diagnosis not present

## 2020-04-02 DIAGNOSIS — K869 Disease of pancreas, unspecified: Secondary | ICD-10-CM

## 2020-04-02 LAB — ECHOCARDIOGRAM COMPLETE
AR max vel: 1.14 cm2
AV Area VTI: 1.15 cm2
AV Area mean vel: 1.15 cm2
AV Mean grad: 14 mmHg
AV Peak grad: 25.2 mmHg
Ao pk vel: 2.51 m/s
Area-P 1/2: 2.39 cm2
Height: 62 in
S' Lateral: 3.7 cm

## 2020-04-02 NOTE — Progress Notes (Signed)
[facility] [place]                                       Cardiology Office Note    Date:  04/02/2020   ID:  Sophia Ward, DOB 07/22/1932, MRN [id number]  PCP:  [name], MD  Cardiologist:  Irish Lack, MD / Dr. Burt Knack & Dr. Roxy Manns (TAVR)  CC: 1 month s/p TAVR  History of Present Illness:  Sophia Ward is a 86 y.o. female with a history of CAD s/p DES to LAD x1 (2004), chronic back issues, rheumatoid arthritis (on MTX and prednisone), multifactorial anemia, PAF previously on Eliquis, DMT2, HTN, HLD and severe aortic stenosis s/p TAVR who presents to clinic for follow up.  The patient's cardiac history dates back to 2004 when she presented with an acute MI. She was treated with PCI and stenting of the LAD. The patient had early in-stent restenosis and underwent repeat PCI and stenting around 5 months later.  She has been followed by Dr.Varanasiand noted to have at least moderate aortic stenosis by echo in 2019: EF 60%, mean gradient of 22 mm Hg.  She was admitted in May 2021 with acute blood loss anemia with a hemoglobin down to 7.9. She underwent EGD and colonoscopy which were unremarkable except for a few ulcers in the terminal ileum. During this admission she was also noted to go into paroxysmal atrial fibrillation. This was a new diagnosis for her. She was started on Eliquis and Plavix was discontinued. 2D echo during this admission revealed normal LV function withprogression of her aortic stenosis to severe: mean gradient 32 mmHg, peak gradient 56.9 mm hg, AVA 0.80 cm2, DVI 0.24, SVI 31.Plans were made to proceed with [facility], which was delayed due to a readmission in 12/2019 forUTI andacute hypoxic respiratory failure due to left lower lobe pneumonia and acute decompensated diastolic heart failure.Covid testwas negative.  The patient eventually underwent left and right heart catheterization on 01/29/2020 with  Dr.Varansi. This revealed severe proximal two-vessel CAD with ostial stenosis of the RCA and 70% proximal stenosis of the LAD prior to widely patent previously placed stents. There was nonobstructive disease in the left circumflex. Mean transvalvular gradient across the aortic valve was measured 16.8 mmHg on pullback corresponding to aortic valve area calculated 1.59 cm. Pulmonary artery pressures were moderately elevated.On 02/25/20 she underwent staged successful PCI of the ostial RCA, followed byorbital atherectomy and PCI ofthe left main/LAD.Struts to circumflex opened with small balloon as well since the circumflex was jailed. She had worsening of her chronic anemia with a Hg down to 7.2 and was transfused. Her case was discussed by the multidisciplinary valve team as well as her hematologist, Dr. Irene Limbo. It was decided to proceed with TAVR and follow Hg closely.   She underwent successful TAVR with a 23 mm Edwards Sapien 3 Ultra THV via the TF approach on 03/03/20. Post operative echo showed EF 60%, normally functioning TAVR with a mean gradient 20 mm hg and no PVL. She was noted to be hypoxic and discharged home on 02 and a short course of lasix. She was resumed on aspirin and plavix. Given anemia, she was left of Eliquis. She has done well in follow up with a big improvement in her symptoms. She was able to stop using the home 02.   Today she presents to clinic for follow  up. Doing well. Breathing much better since TAVR but still has some SOB. No CP. Occasional, mild LE edema at night but no orthopnea or PND. No dizziness or syncope. No blood in stool or urine. No palpitations. Has had some right groin pain that radiates to her thigh.    Past Medical History:  Diagnosis Date  . Allergic rhinitis   . Anemia 09/2019  . Arthritis    hands  . BPPV (benign paroxysmal positional vertigo)   . Coronary artery disease   . Diabetes mellitus without complication ([place])   . Essential hypertension,  benign 01/30/2014  . GERD (gastroesophageal reflux disease)   . Hypertension   . Lesion of pancreas   . Mixed hyperlipidemia 01/30/2014  . Myocardial infarction ([place]) 2004   mild MI-no damage- had stents  . Osteopenia   . Post-menopausal   . Pulmonary nodules   . Rheumatoid arthritis ([place])   . S/P TAVR (transcatheter aortic valve replacement) 03/03/2020   s/p TAVR with a 23 mm Edwards S3U via the TF approach by Drs Burt Knack and Roxy Manns  . Severe aortic stenosis   . Spinal stenosis   . Stenosing tenosynovitis     Past Surgical History:  Procedure Laterality Date  . ABDOMINAL HYSTERECTOMY    . APPENDECTOMY    . BIOPSY  10/25/2019   Procedure: BIOPSY;  Surgeon: Otis Brace, MD;  Location: [place];  Service: Gastroenterology;;  . BIOPSY  10/26/2019   Procedure: BIOPSY;  Surgeon: Otis Brace, MD;  Location: [place] [facility];  Service: Gastroenterology;;  . CARDIAC CATHETERIZATION  2004  . carpel tunnel    . COLONOSCOPY WITH PROPOFOL N/A 10/30/2012   Procedure: COLONOSCOPY WITH PROPOFOL;  Surgeon: Garlan Fair, MD;  Location: [facility];  Service: Endoscopy;  Laterality: N/A;  . COLONOSCOPY WITH PROPOFOL N/A 10/26/2019   Procedure: COLONOSCOPY WITH PROPOFOL;  Surgeon: Otis Brace, MD;  Location: [place];  Service: Gastroenterology;  Laterality: N/A;  . CORONARY ANGIOPLASTY  2004  . CORONARY ATHERECTOMY N/A 02/21/2020   Procedure: CORONARY ATHERECTOMY;  Surgeon: Jettie Booze, MD;  Location: [place] [facility];  Service: Cardiovascular;  Laterality: N/A;  . CORONARY STENT INTERVENTION N/A 02/21/2020   Procedure: CORONARY STENT INTERVENTION;  Surgeon: Jettie Booze, MD;  Location: [place] [facility];  Service: Cardiovascular;  Laterality: N/A;  . DILATION AND CURETTAGE OF UTERUS    . ESOPHAGOGASTRODUODENOSCOPY N/A 10/25/2019   Procedure: ESOPHAGOGASTRODUODENOSCOPY (EGD);  Surgeon: Otis Brace, MD;  Location: [facility];  Service: Gastroenterology;   Laterality: N/A;  . ESOPHAGOGASTRODUODENOSCOPY (EGD) WITH PROPOFOL N/A 10/30/2012   Procedure: ESOPHAGOGASTRODUODENOSCOPY (EGD) WITH PROPOFOL;  Surgeon: Garlan Fair, MD;  Location: [facility];  Service: Endoscopy;  Laterality: N/A;  . EYE SURGERY     bilateral cataract with lens implant  . EYE SURGERY    . INJECTION OF SILICONE OIL Left 08/29/270   Procedure: Injection Of Silicone Oil;  Surgeon: Jalene Mullet, MD;  Location: [place];  Service: Ophthalmology;  Laterality: Left;  . INTRAVASCULAR PRESSURE WIRE/FFR STUDY N/A 01/29/2020   Procedure: INTRAVASCULAR PRESSURE WIRE/FFR STUDY;  Surgeon: Jettie Booze, MD;  Location: [place] [facility];  Service: Cardiovascular;  Laterality: N/A;  . INTRAVASCULAR ULTRASOUND/IVUS N/A 02/21/2020   Procedure: Intravascular Ultrasound/IVUS;  Surgeon: Jettie Booze, MD;  Location: [place] [facility];  Service: Cardiovascular;  Laterality: N/A;  . left shouler surgery    . PHOTOCOAGULATION WITH LASER Left 02/12/2019   Procedure: Photocoagulation With Laser;  Surgeon: Jalene Mullet,  MD;  Location: [place];  Service: Ophthalmology;  Laterality: Left;  . 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 02/12/2019   Procedure: REPAIR OF COMPLEX TRACTION RETINAL DETACHMENT;  Surgeon: Jalene Mullet, MD;  Location: [place];  Service: Ophthalmology;  Laterality: Left;  . RIGHT/LEFT HEART CATH AND CORONARY ANGIOGRAPHY N/A 01/29/2020   Procedure: RIGHT/LEFT HEART CATH AND CORONARY ANGIOGRAPHY;  Surgeon: Jettie Booze, MD;  Location: [place] [facility];  Service: Cardiovascular;  Laterality: N/A;  . TEE WITHOUT CARDIOVERSION N/A 03/03/2020   Procedure: TRANSESOPHAGEAL ECHOCARDIOGRAM (TEE);  Surgeon: Sherren Mocha, MD;  Location: [place];  Service: Open Heart Surgery;  Laterality: N/A;  . TONSILLECTOMY    . TRANSCATHETER AORTIC VALVE REPLACEMENT, TRANSFEMORAL  03/03/2020  . TRANSCATHETER AORTIC VALVE REPLACEMENT, TRANSFEMORAL N/A 03/03/2020   Procedure:  TRANSCATHETER AORTIC VALVE REPLACEMENT, TRANSFEMORAL USING EDWARDS SAPIEN 3 23 MM AORTIC VALVE.;  Surgeon: Sherren Mocha, MD;  Location: [place];  Service: Open Heart Surgery;  Laterality: N/A;  . VITRECTOMY 25 GAUGE WITH SCLERAL BUCKLE Left 02/12/2019   Procedure: Vitrectomy 25 Gauge With Scleral Buckle;  Surgeon: Jalene Mullet, MD;  Location: [place];  Service: Ophthalmology;  Laterality: Left;    Current Medications: Outpatient Medications Prior to Visit  Medication Sig Dispense Refill  . acetaminophen (TYLENOL) 650 MG CR tablet Take 1,300 mg by mouth at bedtime.    Marland Kitchen amLODipine (NORVASC) 10 MG tablet Take 1 tablet (10 mg total) by mouth every morning. 90 tablet 3  . aspirin 81 MG chewable tablet Chew 1 tablet (81 mg total) by mouth daily. 30 tablet 2  . atorvastatin (LIPITOR) 40 MG tablet Take 1 tablet (40 mg total) by mouth at bedtime. 90 tablet 3  . azithromycin (ZITHROMAX) 500 MG tablet Take 1 tablet (500 mg total) by mouth as directed. One tablet by mouth 60 MINUTES before any dental appointment 3 tablet 2  . B-D ULTRAFINE III SHORT PEN 31G X 8 MM MISC 1 each by Other route in the morning, at noon, in the evening, and at bedtime.     . Calcium Carbonate-Vitamin D (CALCIUM + D PO) Take 1 tablet by mouth daily.     . Cholecalciferol (VITAMIN D3) 125 MCG (5000 UT) CAPS Take 5,000 Units by mouth daily.     . clopidogrel (PLAVIX) 75 MG tablet Take 4 tablets (300 mg) by mouth this evening, then take 1 tablet (75 mg) by mouth daily 90 tablet 0  . desonide (DESOWEN) 0.05 % cream Apply 1 application topically 2 (two) times daily as needed.    Marland Kitchen escitalopram (LEXAPRO) 5 MG tablet Take 5 mg by mouth daily.    . ferrous sulfate 325 (65 FE) MG tablet Take 1 tablet (325 mg total) by mouth 2 (two) times daily with a meal. 60 tablet 1  . folic acid (FOLVITE) 1 MG tablet Take 2 tablets (2 mg total) by mouth daily. 180 tablet 2  . insulin lispro (HUMALOG KWIKPEN) 100 UNIT/ML KiwkPen Inject 6 Units into the  skin 3 (three) times daily.     Marland Kitchen ketorolac (ACULAR) 0.4 % SOLN Place 1 drop into the left eye 3 (three) times daily. For 3 months    . LEVEMIR FLEXTOUCH 100 UNIT/ML Pen Inject 14 Units into the skin every morning.   0  . methotrexate 2.5 MG tablet TAKE 5 TABLETS BY MOUTH ONCE A WEEK, CAUTION CHEMOTHERAPY, PROTECT FROM LIGHT 60 tablet 0  . metoprolol succinate (TOPROL-XL) 100 MG 24 hr tablet Take 1 tablet (  100 mg total) by mouth every morning. 90 tablet 3  . nitroGLYCERIN (NITROSTAT) 0.4 MG SL tablet DISSOLVE ONE TABLET UNDER THE TONGUE EVERY 5 MINUTES AS NEEDED FOR CHEST PAIN.  DO NOT EXCEED A TOTAL OF 3 DOSES IN 15 MINUTES 25 tablet 5  . Omega-3 Fatty Acids (FISH OIL PO) Take 1 capsule by mouth daily.     . pantoprazole (PROTONIX) 40 MG tablet Take 1 tablet (40 mg total) by mouth daily after lunch. 30 tablet 1  . predniSONE (DELTASONE) 1 MG tablet TAKE 4 TABLETS BY MOUTH ONCE DAILY WITH BREAKFAST 360 tablet 0  . ramipril (ALTACE) 10 MG tablet Take 10 mg by mouth 2 (two) times daily.     Marland Kitchen estradiol (ESTRACE) 0.1 MG/GM vaginal cream Place 1 Applicatorful vaginally daily as needed (driness).  (Patient not taking: Reported on 04/02/2020)    . furosemide (LASIX) 20 MG tablet Take 1 tablet (20 mg total) by mouth daily. (Patient not taking: Reported on 04/02/2020) 5 tablet 0   No facility-administered medications prior to visit.     Allergies:   Codeine, Glimepiride, Jardiance [empagliflozin], Metformin and related, and Penicillins   Social History   Socioeconomic History  . Marital status: Married    Spouse name: Not on file  . Number of children: Not on file  . Years of education: Not on file  . Highest education level: Not on file  Occupational History  . Not on file  Tobacco Use  . Smoking status: Never Smoker  . Smokeless tobacco: Never Used  Vaping Use  . Vaping Use: Never used  Substance and Sexual Activity  . Alcohol use: No  . Drug use: No  . Sexual activity: Not Currently    Other Topics Concern  . Not on file  Social History Narrative  . Not on file   Social Determinants of Health   Financial Resource Strain:   . Difficulty of Paying Living Expenses: Not on file  Food Insecurity:   . Worried About Charity fundraiser in the Last Year: Not on file  . Ran Out of Food in the Last Year: Not on file  Transportation Needs:   . Lack of Transportation (Medical): Not on file  . Lack of Transportation (Non-Medical): Not on file  Physical Activity:   . Days of Exercise per Week: Not on file  . Minutes of Exercise per Session: Not on file  Stress:   . Feeling of Stress : Not on file  Social Connections:   . Frequency of Communication with Friends and Family: Not on file  . Frequency of Social Gatherings with Friends and Family: Not on file  . Attends Religious Services: Not on file  . Active Member of Clubs or Organizations: Not on file  . Attends Archivist Meetings: Not on file  . Marital Status: Not on file     Family History:  The patient's family history includes Arthritis in her daughter; CAD in her father and mother; Heart Problems in her brother; Heart attack in her mother.     ROS:   Please see the history of present illness.    ROS All other systems reviewed and are negative.   PHYSICAL EXAM:   VS:  BP 140/70   Pulse 68   Ht 5\' 2"  (1.575 m)   Wt 165 lb 3.2 oz (74.9 kg)   SpO2 94%   BMI 30.22 kg/m    GEN: Well nourished, well developed, in no acute distress  HEENT: normal Neck: no JVD or masses Cardiac: RRR; soft flow murmurs @ RUSB. No rubs, or gallops,no edema  Respiratory:  clear to auscultation bilaterally, normal work of breathing GI: soft, nontender, nondistended, + BS MS: no deformity or atrophy Skin: warm and dry, no rash Neuro:  Alert and Oriented x 3, Strength and sensation are intact Psych: euthymic mood, full affect   Wt Readings from Last 3 Encounters:  04/02/20 165 lb 3.2 oz (74.9 kg)  03/11/20 169 lb 3.2  oz (76.7 kg)  03/04/20 171 lb 1.2 oz (77.6 kg)      Studies/Labs Reviewed:   EKG:  EKG is NOT ordered today.    Recent Labs: 01/13/2020: TSH 1.118 02/28/2020: ALT 13; B Natriuretic Peptide 427.7 03/04/2020: Magnesium 1.5 03/11/2020: BUN 17; Creatinine, Ser 1.00; Hemoglobin 10.2; Platelets 389; Potassium 4.5; Sodium 141   Lipid Panel    Component Value Date/Time   CHOL 121 02/25/2020 0223   TRIG 76 02/25/2020 0223   HDL 49 02/25/2020 0223   CHOLHDL 2.5 02/25/2020 0223   VLDL 15 02/25/2020 0223   LDLCALC 57 02/25/2020 0223    Additional studies/ records that were reviewed today include:  TAVR OPERATIVE NOTE   Date of Procedure:03/03/2020  Preoperative Diagnosis:Severe Aortic Stenosis   Postoperative Diagnosis:Same   Procedure:   Transcatheter Aortic Valve Replacement - PercutaneousRightTransfemoral Approach Edwards Sapien 3 Ultra THV (size 60mm, model # 9750TFX, serial # [id number])  Co-Surgeons:Clarence H. Roxy Manns, MD and Sherren Mocha, MD  Anesthesiologist:Thomas Fransisco Beau, MD  Echocardiographer:Peter Johnsie Cancel, MD  Pre-operative Echo Findings: ? Severe aortic stenosis ? Normalleft ventricular systolic function  Post-operative Echo Findings: ? Noparavalvular leak ? Normalleft ventricular systolic function _____________  Echo 03/04/20: IMPRESSIONS  1. Left ventricular ejection fraction, by estimation, is 60 to 65%. The  left ventricle has normal function.  2. Left atrial size was mildly dilated.  3. The mitral valve is abnormal. Mild mitral valve regurgitation. Severe  mitral annular calcification.  4. Post TAVR with 23 mm Sapien 3 valve no PVL Baseline gradients somewhat  high with AVA only 1.2 cm2 and DVI 0.42. The aortic valve has been  repaired/replaced. Aortic valve regurgitation is not visualized.  5. There is moderately elevated  pulmonary artery systolic pressure.  6. The inferior vena cava is dilated in size with >50% respiratory variability, suggesting right atrial pressure of 8 mmHg.   _________________   Echo 06/03/19 IMPRESSIONS    1. Left ventricular ejection fraction, by estimation, is 60 to 65%. The  left ventricle has normal function. The left ventricle has no regional  wall motion abnormalities. Left ventricular diastolic parameters are  consistent with Grade II diastolic  dysfunction (pseudonormalization).  2. Right ventricular systolic function is mildly reduced. The right  ventricular size is normal. There is mildly elevated pulmonary artery  systolic pressure. The estimated right ventricular systolic pressure is  83.3 mmHg.  3. Left atrial size was mildly dilated.  4. The mitral valve is normal in structure. Trivial mitral valve  regurgitation. No evidence of mitral stenosis.  5. Bioprosthetic aortic valve s/p TAVR. Mean gradient 14 mmHg. No  significant peri-valvular leakage.  6. The inferior vena cava is normal in size with greater than 50%  respiratory variability, suggesting right atrial pressure of 3 mmHg.  7. Small, primarily posterior pericardial effusion.   ASSESSMENT & PLAN:   Severe AS s/p TAVR: echo today shows EF 60%, normally functioning TAVR with a mean gradient of 14 mmHg and no PVL, mild posterior pericardial effusion.  She has NYHA class II symptoms. SBE prophylaxis discussed; she has azithromycin. Continue on ASA and Plavix for now. If Hg remains stable, will consider restarting Eliquis and dropping aspirin  Right groin pain: sounds nerve related. Groin exam totally normal.  CAD: s/p recent PCI on 9/24. Continue aspirin and plavix, BB and statin. She gets regular spinal injections for chronic back pain. Cleared to temporarily hold blood thinners including plavix at least 3 months out from PCI (cleared by Dr. Irish Lack)  PAF: sounds regular on exam. Currently off  Eliquis given ongoing anemia and need for DAPT. Hg was up to 10.2 last month. Will recheck today and if stable, I will resume Eliquis and discontinue aspirin.    Chronic multifactorial anemia: Hg up to 10.2 on 10/13. Ccheck CBC today.   HTN: BP with borderline control today. No changes made.  Incidental findings: pre TAVR CT showed  Tiny 1-2 mm pulmonary nodules scattered throughout the periphery of the lungs bilaterally, nonspecific, but statistically likely benign. No follow-up needed if patient is low-risk (and has no known or suspected primary neoplasm). Non-contrast chest CT can be considered in 12 months if patient is high-risk. Pt is lifetime non smoker. No follow up recommended.   1.1 x 1.0 cm intermediate attenuation lesion in the inferior aspect of the uncinate process of the pancreas. This is nonspecific, but favored to represent a benign lesions such as a small pancreatic pseudocyst. Follow-up evaluation with pancreatic protocol CT scan or abdominal MRI with and without IV gadolinium with MRCP in 2 years is recommended to ensure the stability of this lesion.  I have printed a copy of this for her to follow up with her PCP    Medication Adjustments/Labs and Tests Ordered: Current medicines are reviewed at length with the patient today.  Concerns regarding medicines are outlined above.  Medication changes, Labs and Tests ordered today are listed in the Patient Instructions below. Patient Instructions  Medication Instructions:  Your provider recommends that you continue on your current medications as directed. Please refer to the Current Medication list given to you today.   *If you need a refill on your cardiac medications before your next appointment, please call your pharmacy*  Lab Work: TODAY! CBC If you have labs (blood work) drawn today and your tests are completely normal, you will receive your results only by: Marland Kitchen MyChart Message (if you have MyChart) OR . A paper copy in  the mail If you have any lab test that is abnormal or we need to change your treatment, we will call you to review the results.  Follow-Up: Your provider recommends that you schedule a follow-up appointment in 6 months with Dr. Irish Lack. Please call when you know when you will return from [place] to schedule your visit.    Signed, Angelena Form, PA-C  04/02/2020 9:26 PM    [facility] [address], [place], [place]  [address] Phone: [phone]; Fax: [phone]

## 2020-04-02 NOTE — Patient Instructions (Addendum)
Medication Instructions:  Your provider recommends that you continue on your current medications as directed. Please refer to the Current Medication list given to you today.   *If you need a refill on your cardiac medications before your next appointment, please call your pharmacy*  Lab Work: TODAY! CBC If you have labs (blood work) drawn today and your tests are completely normal, you will receive your results only by: Marland Kitchen MyChart Message (if you have MyChart) OR . A paper copy in the mail If you have any lab test that is abnormal or we need to change your treatment, we will call you to review the results.  Follow-Up: Your provider recommends that you schedule a follow-up appointment in 6 months with Dr. Irish Lack. Please call when you know when you will return from [place] to schedule your visit.

## 2020-04-03 ENCOUNTER — Telehealth: Payer: Self-pay

## 2020-04-03 ENCOUNTER — Telehealth (HOSPITAL_COMMUNITY): Payer: Self-pay

## 2020-04-03 LAB — CBC WITH DIFFERENTIAL/PLATELET
Basophils Absolute: 0.1 10*3/uL (ref 0.0–0.2)
Basos: 1 %
EOS (ABSOLUTE): 0.3 10*3/uL (ref 0.0–0.4)
Eos: 2 %
Hematocrit: 36.8 % (ref 34.0–46.6)
Hemoglobin: 11.8 g/dL (ref 11.1–15.9)
Immature Grans (Abs): 0 10*3/uL (ref 0.0–0.1)
Immature Granulocytes: 0 %
Lymphocytes Absolute: 1.2 10*3/uL (ref 0.7–3.1)
Lymphs: 9 %
MCH: 27.3 pg (ref 26.6–33.0)
MCHC: 32.1 g/dL (ref 31.5–35.7)
MCV: 85 fL (ref 79–97)
Monocytes Absolute: 1.4 10*3/uL — ABNORMAL HIGH (ref 0.1–0.9)
Monocytes: 10 %
Neutrophils Absolute: 10.4 10*3/uL — ABNORMAL HIGH (ref 1.4–7.0)
Neutrophils: 78 %
Platelets: 307 10*3/uL (ref 150–450)
RBC: 4.32 x10E6/uL (ref 3.77–5.28)
RDW: 16.6 % — ABNORMAL HIGH (ref 11.7–15.4)
WBC: 13.3 10*3/uL — ABNORMAL HIGH (ref 3.4–10.8)

## 2020-04-03 NOTE — Telephone Encounter (Signed)
-----   Message from Eileen Stanford, PA-C sent at 04/03/2020 10:46 AM EDT ----- Can you let her know that her hemoglobin is now in the normal range. Please have her stop aspirin and resume Eliquis at previous dose. She will continue on plavix and Eliquis for 6 months then drop Plavix and start aspirin.

## 2020-04-03 NOTE — Telephone Encounter (Signed)
The patient has been notified of the result and verbalized understanding.  All questions (if any) were answered. Wilma Flavin, RN 04/03/2020 11:02 AM

## 2020-04-03 NOTE — Telephone Encounter (Signed)
Called and spoke with pt in regards to CR, pt stated she is not interested at this time.   Closed referral 

## 2020-04-05 NOTE — Progress Notes (Signed)
Sounds good. Thanks 

## 2020-04-14 NOTE — Telephone Encounter (Signed)
Error

## 2020-04-16 ENCOUNTER — Telehealth: Payer: Self-pay | Admitting: Interventional Cardiology

## 2020-04-16 NOTE — Telephone Encounter (Signed)
Patient states she is having a rash on her inner right leg and on her behind. She states it is itching, but mainly burns and stings. She states she is not sure if it is shingles. She states She also had an upset stomach on Sunday. She states she has not reached out to her PCP about her symptoms.

## 2020-04-16 NOTE — Telephone Encounter (Signed)
Patient c/o rash on her inner thigh and buttocks X 1-2 weeks. She states that she is not sure if it is the shingles. She c/o an upset stomach on Sunday. Denies having any cardiac Sx. Instructed patient to follow up with PCP.

## 2020-04-17 DIAGNOSIS — B029 Zoster without complications: Secondary | ICD-10-CM | POA: Diagnosis not present

## 2020-04-18 ENCOUNTER — Emergency Department (HOSPITAL_COMMUNITY): Payer: Medicare Other

## 2020-04-18 ENCOUNTER — Encounter (HOSPITAL_COMMUNITY): Payer: Self-pay

## 2020-04-18 ENCOUNTER — Other Ambulatory Visit: Payer: Self-pay

## 2020-04-18 ENCOUNTER — Emergency Department (HOSPITAL_COMMUNITY)
Admission: EM | Admit: 2020-04-18 | Discharge: 2020-04-18 | Disposition: A | Discharge status: Home / Self Care | Payer: Medicare Other | Attending: Emergency Medicine | Admitting: Emergency Medicine

## 2020-04-18 DIAGNOSIS — Z79899 Other long term (current) drug therapy: Secondary | ICD-10-CM | POA: Diagnosis not present

## 2020-04-18 DIAGNOSIS — R930 Abnormal findings on diagnostic imaging of skull and head, not elsewhere classified: Secondary | ICD-10-CM | POA: Insufficient documentation

## 2020-04-18 DIAGNOSIS — M25519 Pain in unspecified shoulder: Secondary | ICD-10-CM | POA: Diagnosis not present

## 2020-04-18 DIAGNOSIS — R52 Pain, unspecified: Secondary | ICD-10-CM | POA: Diagnosis not present

## 2020-04-18 DIAGNOSIS — Z7901 Long term (current) use of anticoagulants: Secondary | ICD-10-CM | POA: Diagnosis not present

## 2020-04-18 DIAGNOSIS — E119 Type 2 diabetes mellitus without complications: Secondary | ICD-10-CM | POA: Insufficient documentation

## 2020-04-18 DIAGNOSIS — S42301A Unspecified fracture of shaft of humerus, right arm, initial encounter for closed fracture: Secondary | ICD-10-CM | POA: Diagnosis not present

## 2020-04-18 DIAGNOSIS — W19XXXA Unspecified fall, initial encounter: Secondary | ICD-10-CM | POA: Insufficient documentation

## 2020-04-18 DIAGNOSIS — Z7982 Long term (current) use of aspirin: Secondary | ICD-10-CM | POA: Insufficient documentation

## 2020-04-18 DIAGNOSIS — Z794 Long term (current) use of insulin: Secondary | ICD-10-CM | POA: Insufficient documentation

## 2020-04-18 DIAGNOSIS — D1779 Benign lipomatous neoplasm of other sites: Secondary | ICD-10-CM | POA: Diagnosis not present

## 2020-04-18 DIAGNOSIS — R609 Edema, unspecified: Secondary | ICD-10-CM | POA: Diagnosis not present

## 2020-04-18 DIAGNOSIS — S4991XA Unspecified injury of right shoulder and upper arm, initial encounter: Secondary | ICD-10-CM | POA: Diagnosis present

## 2020-04-18 DIAGNOSIS — I10 Essential (primary) hypertension: Secondary | ICD-10-CM | POA: Diagnosis not present

## 2020-04-18 DIAGNOSIS — G9389 Other specified disorders of brain: Secondary | ICD-10-CM | POA: Diagnosis not present

## 2020-04-18 DIAGNOSIS — S42294A Other nondisplaced fracture of upper end of right humerus, initial encounter for closed fracture: Secondary | ICD-10-CM

## 2020-04-18 DIAGNOSIS — I6529 Occlusion and stenosis of unspecified carotid artery: Secondary | ICD-10-CM | POA: Diagnosis not present

## 2020-04-18 DIAGNOSIS — M4802 Spinal stenosis, cervical region: Secondary | ICD-10-CM | POA: Diagnosis not present

## 2020-04-18 DIAGNOSIS — S0990XA Unspecified injury of head, initial encounter: Secondary | ICD-10-CM | POA: Diagnosis not present

## 2020-04-18 DIAGNOSIS — I2510 Atherosclerotic heart disease of native coronary artery without angina pectoris: Secondary | ICD-10-CM | POA: Diagnosis not present

## 2020-04-18 DIAGNOSIS — M19011 Primary osteoarthritis, right shoulder: Secondary | ICD-10-CM | POA: Diagnosis not present

## 2020-04-18 DIAGNOSIS — S42211A Unspecified displaced fracture of surgical neck of right humerus, initial encounter for closed fracture: Secondary | ICD-10-CM | POA: Diagnosis not present

## 2020-04-18 DIAGNOSIS — D170 Benign lipomatous neoplasm of skin and subcutaneous tissue of head, face and neck: Secondary | ICD-10-CM | POA: Diagnosis not present

## 2020-04-18 DIAGNOSIS — S42291A Other displaced fracture of upper end of right humerus, initial encounter for closed fracture: Secondary | ICD-10-CM | POA: Diagnosis not present

## 2020-04-18 DIAGNOSIS — S199XXA Unspecified injury of neck, initial encounter: Secondary | ICD-10-CM | POA: Diagnosis not present

## 2020-04-18 MED ORDER — HYDROCODONE-ACETAMINOPHEN 5-325 MG PO TABS: 1.0000 | ORAL_TABLET | Freq: Four times a day (QID) | ORAL | 0 refills | Status: DC | PRN

## 2020-04-18 MED ORDER — HYDROCODONE-ACETAMINOPHEN 5-325 MG PO TABS
1.0000 | ORAL_TABLET | Freq: Once | ORAL | Status: AC
  Administered 2020-04-18: 1 via ORAL
  Filled 2020-04-18: qty 1

## 2020-04-18 MED ORDER — ONDANSETRON HCL 4 MG/2ML IJ SOLN
4.0000 mg | Freq: Once | INTRAMUSCULAR | Status: AC
  Administered 2020-04-18: 4 mg via INTRAVENOUS
  Filled 2020-04-18: qty 2

## 2020-04-18 MED ORDER — ONDANSETRON 4 MG PO TBDP: 4.0000 mg | ORAL_TABLET | Freq: Three times a day (TID) | ORAL | 0 refills | Status: DC | PRN

## 2020-04-18 MED ORDER — ACETAMINOPHEN 325 MG PO TABS
650.0000 mg | ORAL_TABLET | Freq: Once | ORAL | Status: AC
  Administered 2020-04-18: 650 mg via ORAL
  Filled 2020-04-18: qty 2

## 2020-04-18 MED ORDER — FENTANYL CITRATE (PF) 100 MCG/2ML IJ SOLN
50.0000 ug | Freq: Once | INTRAMUSCULAR | Status: AC
  Administered 2020-04-18: 50 ug via INTRAVENOUS
  Filled 2020-04-18: qty 2

## 2020-04-18 NOTE — Progress Notes (Signed)
Orthopedic Tech Progress Note Patient Details:  Sophia Ward 11-Apr-1933 [id number]  Ortho Devices Type of Ortho Device: Sling immobilizer Ortho Device/Splint Location: rue Ortho Device/Splint Interventions: Ordered, Application, Adjustment   Post Interventions Patient Tolerated: Well Instructions Provided: Care of device, Adjustment of device   Karolee Stamps 04/18/2020, 10:31 PM

## 2020-04-18 NOTE — Discharge Instructions (Addendum)
Fracture care There is evidence of a fracture on the x-ray. Pain:  Acetaminophen: May take acetaminophen (generic for Tylenol), as needed, for pain. Your daily total maximum amount of acetaminophen from all sources should be limited to 4000mg /day for persons without liver problems, or 2000mg /day for those with liver problems. Vicodin: May take Vicodin (hydrocodone-acetaminophen) as needed for severe pain.   Do not drive or perform other dangerous activities while taking this medication as it can cause drowsiness as well as changes in reaction time and judgement.   Please note that each pill of Vicodin contains 325 mg of acetaminophen (generic for Tylenol) and the above dosage limits apply. Nausea/vomiting: Use the ondansetron (generic for Zofran) for nausea or vomiting.  This medication may not prevent all vomiting or nausea, but can help facilitate better hydration. Things that can help with nausea/vomiting also include peppermint/menthol candies, vitamin B12, and ginger. Ice: May apply ice to the injured area for no more than 15 minutes at a time to reduce swelling and pain. Elevation: Keep the extremity elevated whenever possible to reduce swelling and pain. In this case, this means you should keep the upper body elevated, but keep the arm in the sling. Many times, this means sitting in a recliner or propped on several pillows.  Sling: Keep the sling clean and dry.  May remove the sling temporarily during bathing, however, bathing should be performed in a protected, seated position where risk for fall is low. Follow-up: Follow-up with the orthopedic specialist for any further management of this issue.  Call the number provided to set up an appointment. Return: Return to the emergency department for severely increased pain, numbness, blanching of the skin, or any other major concerns.

## 2020-04-18 NOTE — ED Notes (Signed)
Pt transported to Xray at this time.

## 2020-04-18 NOTE — ED Provider Notes (Signed)
[facility] EMERGENCY DEPARTMENT Provider Note   CSN: [id number] Arrival date & time: 04/18/20  1758     History Chief Complaint  Patient presents with  . Fall  . Shoulder Injury    Sophia Ward is a 86 y.o. female.  HPI      Sophia Ward is a 86 y.o. female, with a history of hyperlipidemia, MI, aortic valve replacement, presenting to the ED for evaluation following a mechanical fall that occurred shortly prior to arrival.  Patient states she bent down to pick up a package at her front door, the package seemed heavier than she thought, she lost her balance, and fell onto her right shoulder. She does not think she hit her head.  She endorses some intermittent soreness to the left posterior neck. Primarily, she complains of right shoulder pain, moderate to severe, throbbing, nonradiating. Patient is anticoagulated on Eliquis. Denies LOC, nausea/vomiting, shortness of breath, chest pain, back pain, numbness, weakness, or any other complaints.    Past Medical History:  Diagnosis Date  . Allergic rhinitis   . Anemia 09/2019  . Arthritis    hands  . BPPV (benign paroxysmal positional vertigo)   . Coronary artery disease   . Diabetes mellitus without complication ([place])   . Essential hypertension, benign 01/30/2014  . GERD (gastroesophageal reflux disease)   . Hypertension   . Lesion of pancreas   . Mixed hyperlipidemia 01/30/2014  . Myocardial infarction ([place]) 2004   mild MI-no damage- had stents  . Osteopenia   . Post-menopausal   . Pulmonary nodules   . Rheumatoid arthritis ([place])   . S/P TAVR (transcatheter aortic valve replacement) 03/03/2020   s/p TAVR with a 23 mm Edwards S3U via the TF approach by Drs Burt Knack and Roxy Manns  . Severe aortic stenosis   . Spinal stenosis   . Stenosing tenosynovitis     Patient Active Problem List   Diagnosis Date Noted  . Acute on chronic diastolic heart failure ([place]) 03/04/2020  . S/P TAVR (transcatheter  aortic valve replacement) 03/03/2020  . Diabetes mellitus without complication ([place])   . Pulmonary nodules   . Lesion of pancreas   . Severe aortic stenosis   . Pressure injury of skin 01/15/2020  . Iron deficiency anemia 01/06/2020  . Symptomatic anemia 10/23/2019  . Anemia 10/22/2019  . Polymyalgia rheumatica ([place]) 11/25/2016  . Primary osteoarthritis of both hands 11/25/2016  . Bilateral primary osteoarthritis of knee 11/25/2016  . Osteopenia of multiple sites 11/25/2016  . 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 Osteoarthritis of lumbar spine 10/28/2016  . History of gastroesophageal reflux (GERD) 10/27/2016  . Rheumatoid arthritis involving multiple sites with positive rheumatoid factor ([place]) 10/14/2016  . Coronary atherosclerosis of native coronary artery 01/30/2014  . Mixed hyperlipidemia 01/30/2014  . Essential hypertension, benign 01/30/2014  . Obesity, unspecified 01/30/2014    Past Surgical History:  Procedure Laterality Date  . ABDOMINAL HYSTERECTOMY    . APPENDECTOMY    . BIOPSY  10/25/2019   Procedure: BIOPSY;  Surgeon: Otis Brace, MD;  Location: [facility];  Service: Gastroenterology;;  . BIOPSY  10/26/2019   Procedure: BIOPSY;  Surgeon: Otis Brace, MD;  Location: [facility];  Service: Gastroenterology;;  . CARDIAC CATHETERIZATION  2004  . carpel tunnel    . COLONOSCOPY WITH PROPOFOL N/A 10/30/2012   Procedure: COLONOSCOPY WITH PROPOFOL;  Surgeon: Garlan Fair, MD;  Location: [facility];  Service: Endoscopy;  Laterality: N/A;  . COLONOSCOPY  WITH PROPOFOL N/A 10/26/2019   Procedure: COLONOSCOPY WITH PROPOFOL;  Surgeon: Otis Brace, MD;  Location: [facility];  Service: Gastroenterology;  Laterality: N/A;  . CORONARY ANGIOPLASTY  2004  . CORONARY ATHERECTOMY N/A 02/21/2020   Procedure: CORONARY ATHERECTOMY;  Surgeon: Jettie Booze, MD;  Location: [facility] CV LAB;  Service: Cardiovascular;  Laterality: N/A;  .  CORONARY STENT INTERVENTION N/A 02/21/2020   Procedure: CORONARY STENT INTERVENTION;  Surgeon: Jettie Booze, MD;  Location: [facility];  Service: Cardiovascular;  Laterality: N/A;  . DILATION AND CURETTAGE OF UTERUS    . ESOPHAGOGASTRODUODENOSCOPY N/A 10/25/2019   Procedure: ESOPHAGOGASTRODUODENOSCOPY (EGD);  Surgeon: Otis Brace, MD;  Location: [facility] END[facility]COPY;  Service: Gastroenterology;  Laterality: N/A;  . ESOPHAGOGASTRODUODENOSCOPY (EGD) WITH PROPOFOL N/A 10/30/2012   Procedure: ESOPHAGOGASTRODUODENOSCOPY (EGD) WITH PROPOFOL;  Surgeon: Garlan Fair, MD;  Location: [facility];  Service: Endoscopy;  Laterality: N/A;  . EYE SURGERY     bilateral cataract with lens implant  . EYE SURGERY    . INJECTION OF SILICONE OIL Left 8/63/8177   Procedure: Injection Of Silicone Oil;  Surgeon: Jalene Mullet, MD;  Location: [place];  Service: Ophthalmology;  Laterality: Left;  . INTRAVASCULAR PRESSURE WIRE/FFR STUDY N/A 01/29/2020   Procedure: INTRAVASCULAR PRESSURE WIRE/FFR STUDY;  Surgeon: Jettie Booze, MD;  Location: [place] CV LAB;  Service: Cardiovascular;  Laterality: N/A;  . INTRAVASCULAR ULTRASOUND/IVUS N/A 02/21/2020   Procedure: Intravascular Ultrasound/IVUS;  Surgeon: Jettie Booze, MD;  Location: [place] CV LAB;  Service: Cardiovascular;  Laterality: N/A;  . left shouler surgery    . PHOTOCOAGULATION WITH LASER Left 02/12/2019   Procedure: Photocoagulation With Laser;  Surgeon: Jalene Mullet, MD;  Location: [place];  Service: Ophthalmology;  Laterality: Left;  . 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 RIGHT/LEFT HEART CATH AND CORONARY ANGIOGRAPHY N/A 01/29/2020   Procedure: RIGHT/LEFT HEART CATH AND CORONARY ANGIOGRAPHY;  Surgeon: Jettie Booze, MD;  Location: [place] CV  LAB;  Service: Cardiovascular;  Laterality: N/A;  . TEE WITHOUT CARDIOVERSION N/A 03/03/2020   Procedure: TRANSESOPHAGEAL ECHOCARDIOGRAM (TEE);  Surgeon: Sherren Mocha, MD;  Location: [facility];  Service: Open Heart Surgery;  Laterality: N/A;  . TONSILLECTOMY    . TRANSCATHETER AORTIC VALVE REPLACEMENT, TRANSFEMORAL  03/03/2020  . TRANSCATHETER AORTIC VALVE REPLACEMENT, TRANSFEMORAL N/A 03/03/2020   Procedure: TRANSCATHETER AORTIC VALVE REPLACEMENT, TRANSFEMORAL USING EDWARDS SAPIEN 3 23 MM AORTIC VALVE.;  Surgeon: Sherren Mocha, MD;  Location: [facility];  Service: Open Heart Surgery;  Laterality: N/A;  . VITRECTOMY 25 GAUGE WITH SCLERAL BUCKLE Left 02/12/2019   Procedure: Vitrectomy 25 Gauge With Scleral Buckle;  Surgeon: Jalene Mullet, MD;  Location: [facility];  Service: Ophthalmology;  Laterality: Left;     OB History   No obstetric history on file.     Family History  Problem Relation Age of Onset  . CAD Mother   . Heart attack Mother   . CAD Father   . Heart Problems Brother        open heart surgery   . Arthritis Daughter        psoriatic arthritis     Social History   Tobacco Use  . Smoking status: Never Smoker  . Smokeless tobacco: Never Used  Vaping Use  . Vaping Use: Never used  Substance Use Topics  .  Alcohol use: No  . Drug use: No    Home Medications Prior to Admission medications   Medication Sig Start Date End Date Taking? Authorizing Provider  acetaminophen (TYLENOL) 650 MG CR tablet Take 1,300 mg by mouth at bedtime.    [provider]  amLODipine (NORVASC) 10 MG tablet Take 1 tablet (10 mg total) by mouth every morning. 11/11/19   Jettie Booze, MD  aspirin 81 MG chewable tablet Chew 1 tablet (81 mg total) by mouth daily. 02/26/20   Cheryln Manly, NP  atorvastatin (LIPITOR) 40 MG tablet Take 1 tablet (40 mg total) by mouth at bedtime. 04/30/19   Jettie Booze, MD  azithromycin (ZITHROMAX) 500 MG tablet Take 1 tablet (500 mg total) by  mouth as directed. One tablet by mouth 60 MINUTES before any dental appointment 03/11/20   Eileen Stanford, PA-C  B-D ULTRAFINE III SHORT PEN 31G X 8 MM MISC 1 each by Other route in the morning, at noon, in the evening, and at bedtime.  07/29/19   [provider]  Calcium Carbonate-Vitamin D (CALCIUM + D PO) Take 1 tablet by mouth daily.     [provider]  Cholecalciferol (VITAMIN D3) 125 MCG (5000 UT) CAPS Take 5,000 Units by mouth daily.     [provider]  clopidogrel (PLAVIX) 75 MG tablet Take 4 tablets (300 mg) by mouth this evening, then take 1 tablet (75 mg) by mouth daily 02/20/20   Jettie Booze, MD  desonide (DESOWEN) 0.05 % cream Apply 1 application topically 2 (two) times daily as needed.    [provider]  escitalopram (LEXAPRO) 5 MG tablet Take 5 mg by mouth daily. 12/29/19   [provider]  ferrous sulfate 325 (65 FE) MG tablet Take 1 tablet (325 mg total) by mouth 2 (two) times daily with a meal. 02/25/20   Cheryln Manly, NP  folic acid (FOLVITE) 1 MG tablet Take 2 tablets (2 mg total) by mouth daily. 09/30/19   Ofilia Neas, PA-C  HYDROcodone-acetaminophen (NORCO/VICODIN) 5-325 MG tablet Take 1 tablet by mouth every 6 (six) hours as needed for severe pain. 04/18/20   Dayvian Blixt C, PA-C  insulin lispro (HUMALOG KWIKPEN) 100 UNIT/ML KiwkPen Inject 6 Units into the skin 3 (three) times daily.     [provider]  ketorolac (ACULAR) 0.4 % SOLN Place 1 drop into the left eye 3 (three) times daily. For 3 months    [provider]  LEVEMIR FLEXTOUCH 100 UNIT/ML Pen Inject 14 Units into the skin every morning.  08/25/16   [provider]  methotrexate 2.5 MG tablet TAKE 5 TABLETS BY MOUTH ONCE A WEEK, CAUTION CHEMOTHERAPY, PROTECT FROM LIGHT 02/12/20   Ofilia Neas, PA-C  metoprolol succinate (TOPROL-XL) 100 MG 24 hr tablet Take 1 tablet (100 mg total) by mouth every morning. 11/28/19   Jettie Booze,  MD  nitroGLYCERIN (NITROSTAT) 0.4 MG SL tablet DISSOLVE ONE TABLET UNDER THE TONGUE EVERY 5 MINUTES AS NEEDED FOR CHEST PAIN.  DO NOT EXCEED A TOTAL OF 3 DOSES IN 15 MINUTES 11/11/19   Jettie Booze, MD  Omega-3 Fatty Acids (FISH OIL PO) Take 1 capsule by mouth daily.     [provider]  ondansetron (ZOFRAN ODT) 4 MG disintegrating tablet Take 1 tablet (4 mg total) by mouth every 8 (eight) hours as needed for nausea or vomiting. 04/18/20   Violet Seabury C, PA-C  pantoprazole (PROTONIX) 40 MG  tablet Take 1 tablet (40 mg total) by mouth daily after lunch. 10/27/19   Bonnielee Haff, MD  predniSONE (DELTASONE) 1 MG tablet TAKE 4 TABLETS BY MOUTH ONCE DAILY WITH BREAKFAST 02/12/20   Bo Merino, MD  ramipril (ALTACE) 10 MG tablet Take 10 mg by mouth 2 (two) times daily.     [provider]    Allergies    Codeine, Glimepiride, Jardiance [empagliflozin], Metformin and related, and Penicillins  Review of Systems   Review of Systems  Constitutional: Negative for diaphoresis.  Respiratory: Negative for shortness of breath.   Cardiovascular: Negative for chest pain.  Gastrointestinal: Negative for abdominal pain, nausea and vomiting.  Musculoskeletal: Positive for arthralgias. Negative for back pain.  Neurological: Negative for dizziness, syncope, weakness, numbness and headaches.  All other systems reviewed and are negative.   Physical Exam Updated Vital Signs BP (!) 144/58   Pulse 71   Temp 97.7 F (36.5 C) (Oral)   Resp (!) 21   SpO2 100%   Physical Exam Vitals and nursing note reviewed.  Constitutional:      General: She is not in acute distress.    Appearance: She is well-developed. She is not diaphoretic.  HENT:     Head: Normocephalic and atraumatic.     Comments: Head and face examined without evidence of injury.    Mouth/Throat:     Mouth: Mucous membranes are moist.     Pharynx: Oropharynx is clear.  Eyes:     Conjunctiva/sclera: Conjunctivae  normal.  Cardiovascular:     Rate and Rhythm: Normal rate and regular rhythm.     Pulses: Normal pulses.          Radial pulses are 2+ on the right side and 2+ on the left side.       Posterior tibial pulses are 2+ on the right side and 2+ on the left side.     Comments: Tactile temperature in the extremities appropriate and equal bilaterally. Pulmonary:     Effort: Pulmonary effort is normal. No respiratory distress.     Breath sounds: Normal breath sounds.  Chest:     Chest wall: No tenderness.  Abdominal:     Palpations: Abdomen is soft.     Tenderness: There is no abdominal tenderness. There is no guarding.  Musculoskeletal:     Cervical back: Neck supple.     Comments: Tenderness with some swelling to the right proximal humerus.  No noted deformity.  Compartments are soft in the arm. No tenderness, pain with range of motion, swelling, deformity in the rest of the right upper extremity or in the left upper extremity. No tenderness, swelling, deformity, or pain with range of motion to the bilateral lower extremities. Normal motor function intact in all extremities. No midline spinal tenderness.   Skin:    General: Skin is warm and dry.     Capillary Refill: Capillary refill takes less than 2 seconds.  Neurological:     Mental Status: She is alert and oriented to person, place, and time.     Comments: Sensation to light touch grossly intact in each of the extremities. Grip strength equal bilaterally. No noted cognitive deficits. Strength 5/5 in the bilateral lower extremities.  Psychiatric:        Mood and Affect: Mood and affect normal.        Speech: Speech normal.        Behavior: Behavior normal.     ED Results / Procedures / Treatments  Labs (all labs ordered are listed, but only abnormal results are displayed) Labs Reviewed - No data to display  EKG None  Radiology DG Shoulder Right  Result Date: 04/18/2020 CLINICAL DATA:  Status post fall. EXAM: RIGHT  SHOULDER - 2+ VIEW COMPARISON:  None. FINDINGS: Acute fracture is seen involving the surgical neck of the proximal right humerus and adjacent portion of the right humeral head. There is no evidence of dislocation. Mild degenerative changes are seen involving the right acromioclavicular joint and right glenohumeral articulation. A mild amount of lobulated soft tissue calcification is seen adjacent to the inferolateral aspect of the right acromion. IMPRESSION: 1. Acute fracture of the proximal right humerus. Electronically Signed   By: Virgina Norfolk M.D.   On: 04/18/2020 20:21   CT Head Wo Contrast  Result Date: 04/18/2020 CLINICAL DATA:  Fall, on anticoagulation, no loss of consciousness or reported head strike. EXAM: CT HEAD WITHOUT CONTRAST CT CERVICAL SPINE WITHOUT CONTRAST TECHNIQUE: Multidetector CT imaging of the head and cervical spine was performed following the standard protocol without intravenous contrast. Multiplanar CT image reconstructions of the cervical spine were also generated. COMPARISON:  None. FINDINGS: CT HEAD FINDINGS Brain: Small hypoattenuating foci in the bilateral basal ganglia, possibly sequela of remote lacunar infarcts or prominent perivascular. No evidence of acute infarction, hemorrhage, hydrocephalus, extra-axial collection, visible mass lesion or mass effect. Symmetric prominence of the ventricles, cisterns and sulci compatible with parenchymal volume loss. Patchy areas of white matter hypoattenuation are most compatible with chronic microvascular angiopathy. Senescent mineralization of the basal ganglia. Few tiny benign parafalcine lipomas and dural calcifications. Vascular: Atherosclerotic calcification of the carotid siphons and intradural vertebral arteries. No hyperdense vessel. Skull: No calvarial fracture or suspicious osseous lesion. No scalp swelling or hematoma. Sinuses/Orbits: Paranasal sinuses and mastoid air cells are predominantly clear. Prior left scleral  buckle and bilateral lens extractions. Included orbital contents are otherwise unremarkable. Other: Bilateral TMJ arthrosis. CT CERVICAL SPINE FINDINGS Alignment: Stabilization collar is absent at the time of examination. Slight leftward cranial rotation. Mild leftward lateral flexion of the neck as well. No evidence of traumatic listhesis. No abnormally widened, perched or jumped facets. Normal alignment of the craniocervical and atlantoaxial articulations. Skull base and vertebrae: No acute skull base fracture. No vertebral body fracture or height loss. Normal bone mineralization. No worrisome osseous lesions. Moderate arthrosis of the atlantodental and basion dens intervals. Cervical spondylitic changes further detailed below. Soft tissues and spinal canal: No pre or paravertebral fluid or swelling. No visible canal hematoma. Disc levels: Multilevel intervertebral disc height loss with spondylitic endplate changes most pronounced C3-4, C5-6. Disc osteophyte complexes in slight ligamentum flavum hypertrophy result in mild canal stenosis C3-4, moderate stenosis C4-5 and C5-6 with partial effacement of the ventral thecal sac C6-7 as well. Multilevel uncinate spurring facet hypertrophic changes are present as well resulting in mild bilateral foraminal narrowing C3-4 and C5-6. Upper chest: No acute abnormality in the upper chest or imaged lung apices. Other: Cervical carotid atherosclerosis most pronounced at the bifurcations. Some additional calcification of the proximal great vessels. Few small subcentimeter nodules present in the thyroid gland without distinct enlargement of the gland. Likely a multinodular goiter, unlikely to be clinically significant; no routine follow-up imaging recommended (ref: J Am Coll Radiol. 2015 Feb;12(2): 143-50). IMPRESSION: 1. No acute intracranial abnormality. No scalp swelling or calvarial fracture. 2. Small hypoattenuating foci in the bilateral basal ganglia, possibly sequela of  remote lacunar infarcts or prominent perivascular. 3. Chronic microvascular ischemic white matter  disease and mild parenchymal volume loss. 4. No evidence of acute fracture or traumatic listhesis of the cervical spine. 5. Multilevel degenerative changes of the cervical spine as described above. 6. Cervical and intracranial atherosclerosis. Electronically Signed   By: Lovena Le M.D.   On: 04/18/2020 20:55   CT Cervical Spine Wo Contrast  Result Date: 04/18/2020 CLINICAL DATA:  Fall, on anticoagulation, no loss of consciousness or reported head strike. EXAM: CT HEAD WITHOUT CONTRAST CT CERVICAL SPINE WITHOUT CONTRAST TECHNIQUE: Multidetector CT imaging of the head and cervical spine was performed following the standard protocol without intravenous contrast. Multiplanar CT image reconstructions of the cervical spine were also generated. COMPARISON:  None. FINDINGS: CT HEAD FINDINGS Brain: Small hypoattenuating foci in the bilateral basal ganglia, possibly sequela of remote lacunar infarcts or prominent perivascular. No evidence of acute infarction, hemorrhage, hydrocephalus, extra-axial collection, visible mass lesion or mass effect. Symmetric prominence of the ventricles, cisterns and sulci compatible with parenchymal volume loss. Patchy areas of white matter hypoattenuation are most compatible with chronic microvascular angiopathy. Senescent mineralization of the basal ganglia. Few tiny benign parafalcine lipomas and dural calcifications. Vascular: Atherosclerotic calcification of the carotid siphons and intradural vertebral arteries. No hyperdense vessel. Skull: No calvarial fracture or suspicious osseous lesion. No scalp swelling or hematoma. Sinuses/Orbits: Paranasal sinuses and mastoid air cells are predominantly clear. Prior left scleral buckle and bilateral lens extractions. Included orbital contents are otherwise unremarkable. Other: Bilateral TMJ arthrosis. CT CERVICAL SPINE FINDINGS Alignment:  Stabilization collar is absent at the time of examination. Slight leftward cranial rotation. Mild leftward lateral flexion of the neck as well. No evidence of traumatic listhesis. No abnormally widened, perched or jumped facets. Normal alignment of the craniocervical and atlantoaxial articulations. Skull base and vertebrae: No acute skull base fracture. No vertebral body fracture or height loss. Normal bone mineralization. No worrisome osseous lesions. Moderate arthrosis of the atlantodental and basion dens intervals. Cervical spondylitic changes further detailed below. Soft tissues and spinal canal: No pre or paravertebral fluid or swelling. No visible canal hematoma. Disc levels: Multilevel intervertebral disc height loss with spondylitic endplate changes most pronounced C3-4, C5-6. Disc osteophyte complexes in slight ligamentum flavum hypertrophy result in mild canal stenosis C3-4, moderate stenosis C4-5 and C5-6 with partial effacement of the ventral thecal sac C6-7 as well. Multilevel uncinate spurring facet hypertrophic changes are present as well resulting in mild bilateral foraminal narrowing C3-4 and C5-6. Upper chest: No acute abnormality in the upper chest or imaged lung apices. Other: Cervical carotid atherosclerosis most pronounced at the bifurcations. Some additional calcification of the proximal great vessels. Few small subcentimeter nodules present in the thyroid gland without distinct enlargement of the gland. Likely a multinodular goiter, unlikely to be clinically significant; no routine follow-up imaging recommended (ref: J Am Coll Radiol. 2015 Feb;12(2): 143-50). IMPRESSION: 1. No acute intracranial abnormality. No scalp swelling or calvarial fracture. 2. Small hypoattenuating foci in the bilateral basal ganglia, possibly sequela of remote lacunar infarcts or prominent perivascular. 3. Chronic microvascular ischemic white matter disease and mild parenchymal volume loss. 4. No evidence of acute  fracture or traumatic listhesis of the cervical spine. 5. Multilevel degenerative changes of the cervical spine as described above. 6. Cervical and intracranial atherosclerosis. Electronically Signed   By: Lovena Le M.D.   On: 04/18/2020 20:55   DG Humerus Right  Result Date: 04/18/2020 CLINICAL DATA:  Status post fall. EXAM: RIGHT HUMERUS - 2+ VIEW COMPARISON:  None. FINDINGS: Acute fracture deformity is seen extending through  the surgical neck of the proximal right humerus and adjacent portion of the right humeral head. There is no evidence of dislocation. Mild lobulated soft tissue calcification is seen adjacent to the inferolateral aspect of the right acromion. IMPRESSION: Acute fracture of the proximal right humerus. Electronically Signed   By: Virgina Norfolk M.D.   On: 04/18/2020 20:22    Procedures Procedures (including critical care time)  Angiocath insertion Performed by: Lorayne Bender  Consent: Verbal consent obtained. Risks and benefits: risks, benefits and alternatives were discussed Time out: Immediately prior to procedure a "time out" was called to verify the correct patient, procedure, equipment, support staff and site/side marked as required.  Preparation: Patient was prepped and draped in the usual sterile fashion.  Vein Location: Left AC  Gauge: 20 ga  Normal blood return and flush without difficulty. Positive flash. Flushed without pain, swelling, or other signs of infiltration. Patient tolerance: Patient tolerated the procedure well with no immediate complications.    Medications Ordered in ED Medications  ondansetron (ZOFRAN) injection 4 mg (has no administration in time range)  HYDROcodone-acetaminophen (NORCO/VICODIN) 5-325 MG per tablet 1 tablet (has no administration in time range)  acetaminophen (TYLENOL) tablet 650 mg (has no administration in time range)  fentaNYL (SUBLIMAZE) injection 50 mcg (50 mcg Intravenous Given 04/18/20 1944)    ED Course    I have reviewed the triage vital signs and the nursing notes.  Pertinent labs & imaging results that were available during my care of the patient were reviewed by me and considered in my medical decision making (see chart for details).    MDM Rules/Calculators/A&P                          Patient presents with injury to the right arm and shoulder following a mechanical fall.  No evidence of neurovascular compromise.  Compartments are soft throughout multiple checks here in the ED. I personally reviewed and interpreted the patient's imaging studies. Proximal humeral fracture noted on x-ray.  Patient placed in sling immobilizer.  Circulation, motor function, sensation intact before and after sling placement. Orthopedic follow-up.  Patient is seeing Dr. Rhona Raider in the past.  Message was sent to Dr. Rhona Raider detailing patient's ED visit.   Patient and her husband at the bedside were given instructions for home care as well as return precautions.  Both parties voice understanding of these instructions, accept the plan, and are comfortable with discharge.   Findings and plan of care discussed with Malvin Johns, MD. Dr. Tamera Punt personally evaluated and examined this patient.    Vitals:   04/18/20 1804 04/18/20 1821 04/18/20 1915 04/18/20 1930  BP: (!) 145/54 136/60 (!) 138/59 (!) 144/58  Pulse: 71 74 71 71  Resp: 20 (!) 22 20 (!) 21  Temp: 97.7 F (36.5 C) 97.7 F (36.5 C)    TempSrc: Oral Oral    SpO2: 100% 100% 100% 100%     Final Clinical Impression(s) / ED Diagnoses Final diagnoses:  Fall, initial encounter  Other closed nondisplaced fracture of proximal end of right humerus, initial encounter    Rx / DC Orders ED Discharge Orders         Ordered    HYDROcodone-acetaminophen (NORCO/VICODIN) 5-325 MG tablet  Every 6 hours PRN        04/18/20 2148    ondansetron (ZOFRAN ODT) 4 MG disintegrating tablet  Every 8 hours PRN        04/18/20  2148           Lorayne Bender,  PA-C 04/18/20 2154    Malvin Johns, MD 04/19/20 1113

## 2020-04-18 NOTE — ED Notes (Signed)
Discharge instructions provided to patient. Verbalized understanding. Alert and oriented. IV lock removed. Escorted out of ED via w/c.

## 2020-04-18 NOTE — ED Triage Notes (Signed)
Patient arrives via ems after a fall onto her right arm injuring right shoulder and elbow. Denies loc. Did not hit head. Takes plavix and eliquis. Alert and oriented.

## 2020-04-20 DIAGNOSIS — S42251A Displaced fracture of greater tuberosity of right humerus, initial encounter for closed fracture: Secondary | ICD-10-CM | POA: Diagnosis not present

## 2020-04-22 NOTE — Progress Notes (Signed)
Office Visit Note  Patient: Sophia Ward             Date of Birth: Oct 02, 1932           MRN: [id number]             PCP: [name], MD Referring: [name], MD Visit Date: 05/06/2020 Occupation: @GUAROCC @  Subjective:  Other (patient reports fall on 04/18/20 and her right shoulder is fractured. )   History of Present Illness: Sophia Ward is a 86 y.o. female with history of rheumatoid arthritis and osteoarthritis.  She states that she fell on March 26, 2000 and fractured her right humerus.  She has been under care of Dr. Demetrius Revel.  She is in a sling and she will have follow-up appointment with her.  She states she also injured her elbow which is gradually healing.  Her rheumatoid arthritis is well controlled on methotrexate 5 tablets p.o. weekly and prednisone 4 mg p.o. daily.  She plans to taper prednisone as tolerated by 1 mg every 2 months.  She has not had any flare of polymyalgia rheumatica.  She states that she had aortic valve replacement in October 2021.  She also had blood transfusion due to low hemoglobin.  She has not had any recent infections.  Activities of Daily Living:  Patient reports morning stiffness for 3-4  hours.   Patient Denies nocturnal pain.  Difficulty dressing/grooming: Denies Difficulty climbing stairs: Reports Difficulty getting out of chair: Reports Difficulty using hands for taps, buttons, cutlery, and/or writing: Reports  Review of Systems  Constitutional: Positive for fatigue.  HENT: Positive for mouth dryness. Negative for mouth sores and nose dryness.   Eyes: Positive for itching and dryness. Negative for pain.  Respiratory: Negative for shortness of breath and difficulty breathing.   Cardiovascular: Negative for chest pain and palpitations.  Gastrointestinal: Positive for constipation and diarrhea. Negative for blood in stool.  Endocrine: Negative for increased urination.  Genitourinary: Negative for difficulty urinating.   Musculoskeletal: Positive for morning stiffness. Negative for arthralgias, joint pain, joint swelling, myalgias, muscle tenderness and myalgias.  Skin: Negative for color change, rash and redness.  Allergic/Immunologic: Positive for susceptible to infections.  Neurological: Positive for numbness and weakness. Negative for dizziness, headaches and memory loss.  Hematological: Positive for bruising/bleeding tendency.  Psychiatric/Behavioral: Negative for confusion.    PMFS History:  Patient Active Problem List   Diagnosis Date Noted  . Acute on chronic diastolic heart failure ([place]) 03/04/2020  . S/P TAVR (transcatheter aortic valve replacement) 03/03/2020  . Diabetes mellitus without complication ([place])   . Pulmonary nodules   . Lesion of pancreas   . Severe aortic stenosis   . Pressure injury of skin 01/15/2020  . Iron deficiency anemia 01/06/2020  . Symptomatic anemia 10/23/2019  . Anemia 10/22/2019  . Polymyalgia rheumatica ([place]) 11/25/2016  . Primary osteoarthritis of both hands 11/25/2016  . Bilateral primary osteoarthritis of knee 11/25/2016  . Osteopenia of multiple sites 11/25/2016  . 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 Osteoarthritis of lumbar spine 10/28/2016  . History of gastroesophageal reflux (GERD) 10/27/2016  . Rheumatoid arthritis involving multiple sites with positive rheumatoid factor ([place]) 10/14/2016  . Coronary atherosclerosis of native coronary artery 01/30/2014  . Mixed hyperlipidemia 01/30/2014  . Essential hypertension, benign 01/30/2014  . Obesity, unspecified 01/30/2014    Past Medical History:  Diagnosis Date  . Allergic rhinitis   . Anemia 09/2019  . Arthritis    hands  .  BPPV (benign paroxysmal positional vertigo)   . Coronary artery disease   . Diabetes mellitus without complication ([place])   . Essential hypertension, benign 01/30/2014  . GERD (gastroesophageal reflux disease)   . Hypertension   . Lesion of pancreas   .  Mixed hyperlipidemia 01/30/2014  . Myocardial infarction ([place]) 2004   mild MI-no damage- had stents  . Osteopenia   . Post-menopausal   . Pulmonary nodules   . Rheumatoid arthritis ([place])   . S/P TAVR (transcatheter aortic valve replacement) 03/03/2020   s/p TAVR with a 23 mm Edwards S3U via the TF approach by Drs Burt Knack and Roxy Manns  . Severe aortic stenosis   . Spinal stenosis   . Stenosing tenosynovitis     Family History  Problem Relation Age of Onset  . CAD Mother   . Heart attack Mother   . CAD Father   . Heart Problems Brother        open heart surgery   . Arthritis Daughter        psoriatic arthritis    Past Surgical History:  Procedure Laterality Date  . ABDOMINAL HYSTERECTOMY    . APPENDECTOMY    . BIOPSY  10/25/2019   Procedure: BIOPSY;  Surgeon: Otis Brace, MD;  Location: [facility];  Service: Gastroenterology;;  . BIOPSY  10/26/2019   Procedure: BIOPSY;  Surgeon: Otis Brace, MD;  Location: [facility];  Service: Gastroenterology;;  . CARDIAC CATHETERIZATION  2004  . carpel tunnel    . COLONOSCOPY WITH PROPOFOL N/A 10/30/2012   Procedure: COLONOSCOPY WITH PROPOFOL;  Surgeon: Garlan Fair, MD;  Location: [facility];  Service: Endoscopy;  Laterality: N/A;  . COLONOSCOPY WITH PROPOFOL N/A 10/26/2019   Procedure: COLONOSCOPY WITH PROPOFOL;  Surgeon: Otis Brace, MD;  Location: [facility];  Service: Gastroenterology;  Laterality: N/A;  . CORONARY ANGIOPLASTY  2004  . CORONARY ATHERECTOMY N/A 02/21/2020   Procedure: CORONARY ATHERECTOMY;  Surgeon: Jettie Booze, MD;  Location: [facility];  Service: Cardiovascular;  Laterality: N/A;  . CORONARY STENT INTERVENTION N/A 02/21/2020   Procedure: CORONARY STENT INTERVENTION;  Surgeon: Jettie Booze, MD;  Location: [place] [facility];  Service: Cardiovascular;  Laterality: N/A;  . DILATION AND CURETTAGE OF UTERUS    . ESOPHAGOGASTRODUODENOSCOPY N/A 10/25/2019   Procedure:  ESOPHAGOGASTRODUODENOSCOPY (EGD);  Surgeon: Otis Brace, MD;  Location[facility];  Service: Gastroenterology;  Laterality: N/A;  . ESOPHAGOGASTRODUODENOSCOPY (EGD) WITH PROPOFOL N/A 10/30/2012   Procedure: ESOPHAGOGASTRODUODENOSCOPY (EGD) WITH PROPOFOL;  Surgeon: Garlan Fair, MD;  Location: [facility];  Service: Endoscopy;  Laterality: N/A;  . EYE SURGERY     bilateral cataract with lens implant  . EYE SURGERY    . INJECTION OF SILICONE OIL Left 2/95/6213   Procedure: Injection Of Silicone Oil;  Surgeon: Jalene Mullet, MD;  Location: [place];  Service: Ophthalmology;  Laterality: Left;  . INTRAVASCULAR PRESSURE WIRE/FFR STUDY N/A 01/29/2020   Procedure: INTRAVASCULAR PRESSURE WIRE/FFR STUDY;  Surgeon: Jettie Booze, MD;  Location: [place] [facility];  Service: Cardiovascular;  Laterality: N/A;  . INTRAVASCULAR ULTRASOUND/IVUS N/A 02/21/2020   Procedure: Intravascular Ultrasound/IVUS;  Surgeon: Jettie Booze, MD;  Location: [place] [facility];  Service: Cardiovascular;  Laterality: N/A;  . left shouler surgery    . PHOTOCOAGULATION WITH LASER Left 02/12/2019   Procedure: Photocoagulation With Laser;  Surgeon: Jalene Mullet, MD;  Location: [place];  Service: Ophthalmology;  Laterality: Left;  . 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 RIGHT/LEFT HEART CATH AND CORONARY ANGIOGRAPHY N/A 01/29/2020   Procedure: RIGHT/LEFT HEART CATH AND CORONARY ANGIOGRAPHY;  Surgeon: Jettie Booze, MD;  Location: [place] [facility];  Service: Cardiovascular;  Laterality: N/A;  . TEE WITHOUT CARDIOVERSION N/A 03/03/2020   Procedure: TRANSESOPHAGEAL ECHOCARDIOGRAM (TEE);  Surgeon: Sherren Mocha, MD;  Location: [facility];  Service: Open Heart Surgery;  Laterality: N/A;  . TONSILLECTOMY    . TRANSCATHETER AORTIC VALVE  REPLACEMENT, TRANSFEMORAL  03/03/2020  . TRANSCATHETER AORTIC VALVE REPLACEMENT, TRANSFEMORAL N/A 03/03/2020   Procedure: TRANSCATHETER AORTIC VALVE REPLACEMENT, TRANSFEMORAL USING EDWARDS SAPIEN 3 23 MM AORTIC VALVE.;  Surgeon: Sherren Mocha, MD;  Location: [facility];  Service: Open Heart Surgery;  Laterality: N/A;  . VITRECTOMY 25 GAUGE WITH SCLERAL BUCKLE Left 02/12/2019   Procedure: Vitrectomy 25 Gauge With Scleral Buckle;  Surgeon: Jalene Mullet, MD;  Location: [facility];  Service: Ophthalmology;  Laterality: Left;   Social History   Social History Narrative  . Not on file   Immunization History  Administered Date(s) Administered  . Fluad Quad(high Dose 65+) 03/04/2020  . Influenza, High Dose Seasonal PF 03/09/2014, 04/09/2015, 03/16/2016, 02/25/2017, 03/20/2018  . PFIZER SARS-COV-2 Vaccination 06/11/2019, 07/01/2019, 03/19/2020  . Zoster Recombinat (Shingrix) 02/07/2018, 06/04/2018     Objective: Vital Signs: BP 112/69 (BP Location: Left Arm, Patient Position: Sitting, Cuff Size: Normal)   Pulse 83   Resp 16   Ht 5' 2.5" (1.588 m)   Wt 162 lb (73.5 kg)   BMI 29.16 kg/m    Physical Exam Vitals and nursing note reviewed.  Constitutional:      Appearance: She is well-developed.  HENT:     Head: Normocephalic and atraumatic.  Eyes:     Conjunctiva/sclera: Conjunctivae normal.  Cardiovascular:     Rate and Rhythm: Normal rate and regular rhythm.     Heart sounds: Normal heart sounds.  Pulmonary:     Effort: Pulmonary effort is normal.     Breath sounds: Normal breath sounds.  Abdominal:     General: Bowel sounds are normal.     Palpations: Abdomen is soft.  Musculoskeletal:     Cervical back: Normal range of motion.  Lymphadenopathy:     Cervical: No cervical adenopathy.  Skin:    General: Skin is warm and dry.     Capillary Refill: Capillary refill takes less than 2 seconds.  Neurological:     Mental Status: She is alert and oriented to person, place, and time.   Psychiatric:        Behavior: Behavior normal.      Musculoskeletal Exam: C-spine was in good range of motion.  Her right arm is in a sling due to humerus fracture.  She had mild edema on her right hand but no synovitis.  Left shoulder, elbow joint and wrist joints with good range of motion.  She had no synovitis over wrist joints MCPs or PIPs.  Hip joints were difficult to assess in the sitting position.  She had good muscle strength in her lower extremities.  She had good range of motion of bilateral knee joints with no swelling.  There was no tenderness over ankles or MTPs.  CDAI Exam: CDAI Score: 0.6  Patient Global: 3 mm; Provider Global: 3 mm Swollen: 0 ; Tender: 0  Joint Exam 05/06/2020   No joint exam has  been documented for this visit   There is currently no information documented on the homunculus. Go to the Rheumatology activity and complete the homunculus joint exam.  Investigation: No additional findings.  Imaging: DG Shoulder Right  Result Date: 04/18/2020 CLINICAL DATA:  Status post fall. EXAM: RIGHT SHOULDER - 2+ VIEW COMPARISON:  None. FINDINGS: Acute fracture is seen involving the surgical neck of the proximal right humerus and adjacent portion of the right humeral head. There is no evidence of dislocation. Mild degenerative changes are seen involving the right acromioclavicular joint and right glenohumeral articulation. A mild amount of lobulated soft tissue calcification is seen adjacent to the inferolateral aspect of the right acromion. IMPRESSION: 1. Acute fracture of the proximal right humerus. Electronically Signed   By: Virgina Norfolk M.D.   On: 04/18/2020 20:21   CT Head Wo Contrast  Result Date: 04/18/2020 CLINICAL DATA:  Fall, on anticoagulation, no loss of consciousness or reported head strike. EXAM: CT HEAD WITHOUT CONTRAST CT CERVICAL SPINE WITHOUT CONTRAST TECHNIQUE: Multidetector CT imaging of the head and cervical spine was performed following the  standard protocol without intravenous contrast. Multiplanar CT image reconstructions of the cervical spine were also generated. COMPARISON:  None. FINDINGS: CT HEAD FINDINGS Brain: Small hypoattenuating foci in the bilateral basal ganglia, possibly sequela of remote lacunar infarcts or prominent perivascular. No evidence of acute infarction, hemorrhage, hydrocephalus, extra-axial collection, visible mass lesion or mass effect. Symmetric prominence of the ventricles, cisterns and sulci compatible with parenchymal volume loss. Patchy areas of white matter hypoattenuation are most compatible with chronic microvascular angiopathy. Senescent mineralization of the basal ganglia. Few tiny benign parafalcine lipomas and dural calcifications. Vascular: Atherosclerotic calcification of the carotid siphons and intradural vertebral arteries. No hyperdense vessel. Skull: No calvarial fracture or suspicious osseous lesion. No scalp swelling or hematoma. Sinuses/Orbits: Paranasal sinuses and mastoid air cells are predominantly clear. Prior left scleral buckle and bilateral lens extractions. Included orbital contents are otherwise unremarkable. Other: Bilateral TMJ arthrosis. CT CERVICAL SPINE FINDINGS Alignment: Stabilization collar is absent at the time of examination. Slight leftward cranial rotation. Mild leftward lateral flexion of the neck as well. No evidence of traumatic listhesis. No abnormally widened, perched or jumped facets. Normal alignment of the craniocervical and atlantoaxial articulations. Skull base and vertebrae: No acute skull base fracture. No vertebral body fracture or height loss. Normal bone mineralization. No worrisome osseous lesions. Moderate arthrosis of the atlantodental and basion dens intervals. Cervical spondylitic changes further detailed below. Soft tissues and spinal canal: No pre or paravertebral fluid or swelling. No visible canal hematoma. Disc levels: Multilevel intervertebral disc height  loss with spondylitic endplate changes most pronounced C3-4, C5-6. Disc osteophyte complexes in slight ligamentum flavum hypertrophy result in mild canal stenosis C3-4, moderate stenosis C4-5 and C5-6 with partial effacement of the ventral thecal sac C6-7 as well. Multilevel uncinate spurring facet hypertrophic changes are present as well resulting in mild bilateral foraminal narrowing C3-4 and C5-6. Upper chest: No acute abnormality in the upper chest or imaged lung apices. Other: Cervical carotid atherosclerosis most pronounced at the bifurcations. Some additional calcification of the proximal great vessels. Few small subcentimeter nodules present in the thyroid gland without distinct enlargement of the gland. Likely a multinodular goiter, unlikely to be clinically significant; no routine follow-up imaging recommended (ref: J Am Coll Radiol. 2015 Feb;12(2): 143-50). IMPRESSION: 1. No acute intracranial abnormality. No scalp swelling or calvarial fracture. 2. Small hypoattenuating foci in the bilateral basal ganglia, possibly sequela of remote lacunar infarcts  or prominent perivascular. 3. Chronic microvascular ischemic white matter disease and mild parenchymal volume loss. 4. No evidence of acute fracture or traumatic listhesis of the cervical spine. 5. Multilevel degenerative changes of the cervical spine as described above. 6. Cervical and intracranial atherosclerosis. Electronically Signed   By: Lovena Le M.D.   On: 04/18/2020 20:55   CT Cervical Spine Wo Contrast  Result Date: 04/18/2020 CLINICAL DATA:  Fall, on anticoagulation, no loss of consciousness or reported head strike. EXAM: CT HEAD WITHOUT CONTRAST CT CERVICAL SPINE WITHOUT CONTRAST TECHNIQUE: Multidetector CT imaging of the head and cervical spine was performed following the standard protocol without intravenous contrast. Multiplanar CT image reconstructions of the cervical spine were also generated. COMPARISON:  None. FINDINGS: CT HEAD  FINDINGS Brain: Small hypoattenuating foci in the bilateral basal ganglia, possibly sequela of remote lacunar infarcts or prominent perivascular. No evidence of acute infarction, hemorrhage, hydrocephalus, extra-axial collection, visible mass lesion or mass effect. Symmetric prominence of the ventricles, cisterns and sulci compatible with parenchymal volume loss. Patchy areas of white matter hypoattenuation are most compatible with chronic microvascular angiopathy. Senescent mineralization of the basal ganglia. Few tiny benign parafalcine lipomas and dural calcifications. Vascular: Atherosclerotic calcification of the carotid siphons and intradural vertebral arteries. No hyperdense vessel. Skull: No calvarial fracture or suspicious osseous lesion. No scalp swelling or hematoma. Sinuses/Orbits: Paranasal sinuses and mastoid air cells are predominantly clear. Prior left scleral buckle and bilateral lens extractions. Included orbital contents are otherwise unremarkable. Other: Bilateral TMJ arthrosis. CT CERVICAL SPINE FINDINGS Alignment: Stabilization collar is absent at the time of examination. Slight leftward cranial rotation. Mild leftward lateral flexion of the neck as well. No evidence of traumatic listhesis. No abnormally widened, perched or jumped facets. Normal alignment of the craniocervical and atlantoaxial articulations. Skull base and vertebrae: No acute skull base fracture. No vertebral body fracture or height loss. Normal bone mineralization. No worrisome osseous lesions. Moderate arthrosis of the atlantodental and basion dens intervals. Cervical spondylitic changes further detailed below. Soft tissues and spinal canal: No pre or paravertebral fluid or swelling. No visible canal hematoma. Disc levels: Multilevel intervertebral disc height loss with spondylitic endplate changes most pronounced C3-4, C5-6. Disc osteophyte complexes in slight ligamentum flavum hypertrophy result in mild canal stenosis C3-4,  moderate stenosis C4-5 and C5-6 with partial effacement of the ventral thecal sac C6-7 as well. Multilevel uncinate spurring facet hypertrophic changes are present as well resulting in mild bilateral foraminal narrowing C3-4 and C5-6. Upper chest: No acute abnormality in the upper chest or imaged lung apices. Other: Cervical carotid atherosclerosis most pronounced at the bifurcations. Some additional calcification of the proximal great vessels. Few small subcentimeter nodules present in the thyroid gland without distinct enlargement of the gland. Likely a multinodular goiter, unlikely to be clinically significant; no routine follow-up imaging recommended (ref: J Am Coll Radiol. 2015 Feb;12(2): 143-50). IMPRESSION: 1. No acute intracranial abnormality. No scalp swelling or calvarial fracture. 2. Small hypoattenuating foci in the bilateral basal ganglia, possibly sequela of remote lacunar infarcts or prominent perivascular. 3. Chronic microvascular ischemic white matter disease and mild parenchymal volume loss. 4. No evidence of acute fracture or traumatic listhesis of the cervical spine. 5. Multilevel degenerative changes of the cervical spine as described above. 6. Cervical and intracranial atherosclerosis. Electronically Signed   By: Lovena Le M.D.   On: 04/18/2020 20:55   DG Humerus Right  Result Date: 04/18/2020 CLINICAL DATA:  Status post fall. EXAM: RIGHT HUMERUS - 2+ VIEW COMPARISON:  None. FINDINGS: Acute fracture deformity is seen extending through the surgical neck of the proximal right humerus and adjacent portion of the right humeral head. There is no evidence of dislocation. Mild lobulated soft tissue calcification is seen adjacent to the inferolateral aspect of the right acromion. IMPRESSION: Acute fracture of the proximal right humerus. Electronically Signed   By: Virgina Norfolk M.D.   On: 04/18/2020 20:22    Recent Labs: Lab Results  Component Value Date   WBC 15.3 (H) 04/27/2020    HGB 11.3 (L) 04/27/2020   PLT 379 04/27/2020   NA 139 04/27/2020   K 3.9 04/27/2020   CL 104 04/27/2020   CO2 25 04/27/2020   GLUCOSE 182 (H) 04/27/2020   BUN 17 04/27/2020   CREATININE 1.06 (H) 04/27/2020   BILITOT 0.4 04/27/2020   ALKPHOS 50 04/27/2020   AST 16 04/27/2020   ALT 13 04/27/2020   PROT 7.3 04/27/2020   ALBUMIN 3.2 (L) 04/27/2020   CALCIUM 10.0 04/27/2020   GFRAA 60 03/11/2020    Speciality Comments: No specialty comments available.  Procedures:  No procedures performed Allergies: Codeine, Glimepiride, Jardiance [empagliflozin], Metformin and related, and Penicillins   Assessment / Plan:     Visit Diagnoses: Rheumatoid arthritis involving multiple sites with positive rheumatoid factor (HCC) - RF 309, ANA negative, CRP 5.8, synovitis, treated by Dr. Philbert Riser in [place] during the winter months: She has been doing well without any joint pain or joint swelling.  She is on methotrexate 5 tablets/week along with prednisone 4 mg p.o. daily.  She would like to decrease prednisone to 3 mg p.o. daily.  I was in agreement.  If she has a flare of her symptoms she will notify us.  High risk medication use - Methotrexate 2.5 mg 5 tablet every 7 days ., folic acid 1 mg 2 tablets daily, and prednisone 4 mg po daily. - Plan: CBC with Differential/Platelet, COMPLETE METABOLIC PANEL WITH GFR, COMPLETE METABOLIC PANEL WITH GFR, CBC with Differential/Platelet, COMPLETE METABOLIC PANEL WITH GFR, CBC with Differential/Platelet.  She will needs labs in February and in May.  As she will be in [place] she will get labs there and will send them to [place].  Polymyalgia rheumatica (HCC) - on Prednisone 4 mg po qd.  She had no muscular weakness or tenderness.  Primary osteoarthritis of both hands-joint protection muscle strengthening was discussed.  Primary osteoarthritis of both knees-she is currently not having much discomfort.  Primary osteoarthritis of both feet-proper fitting shoes were  discussed.  History of humerus fracture-she had right proximal humeral shaft fracture which she has been followed by Dr. Latanya Maudlin.  She is in a arm sling.  Chronic anticoagulation-she is on Xarelto.  She had AV valve replacement in October 2021.  Osteopenia of multiple sites - DEXA 05/15/18: The BMD measured at Femur Neck Left is 0.820 g/cm2 with a T-score of -1.6. Patient states she will be getting repeat DEXA through her PCP.  She has appointment this month.  Other medical problems are listed as follows:  History of hyperlipidemia  History of diabetes mellitus  History of hypertension-blood pressure is well controlled.  History of gastroesophageal reflux (GERD)  Educated about COVID-19 virus infection-she has been fully vaccinated against COVID-19 and also received a booster.  Use of mask, social distancing and hand hygiene was discussed.  I placed instructions in the AVS.  Orders: Orders Placed This Encounter  Procedures  . CBC with Differential/Platelet  . COMPLETE METABOLIC PANEL WITH GFR  No orders of the defined types were placed in this encounter.     Follow-Up Instructions: Return in about 5 months (around 10/04/2020) for Rheumatoid arthritis, Osteoarthritis.   Bo Merino, MD  Note - This record has been created using Editor, commissioning.  Chart creation errors have been sought, but may not always  have been located. Such creation errors do not reflect on  the standard of medical care.

## 2020-04-25 DIAGNOSIS — S42251A Displaced fracture of greater tuberosity of right humerus, initial encounter for closed fracture: Secondary | ICD-10-CM | POA: Diagnosis not present

## 2020-04-25 DIAGNOSIS — M25511 Pain in right shoulder: Secondary | ICD-10-CM | POA: Diagnosis not present

## 2020-04-27 ENCOUNTER — Inpatient Hospital Stay (HOSPITAL_BASED_OUTPATIENT_CLINIC_OR_DEPARTMENT_OTHER): Payer: Medicare Other | Admitting: Hematology

## 2020-04-27 ENCOUNTER — Inpatient Hospital Stay: Payer: Medicare Other | Attending: Hematology

## 2020-04-27 ENCOUNTER — Other Ambulatory Visit: Payer: Self-pay

## 2020-04-27 VITALS — BP 148/62 | HR 79 | Temp 98.6°F | Resp 18 | Ht 62.0 in | Wt 163.2 lb

## 2020-04-27 DIAGNOSIS — R634 Abnormal weight loss: Secondary | ICD-10-CM | POA: Diagnosis not present

## 2020-04-27 DIAGNOSIS — E785 Hyperlipidemia, unspecified: Secondary | ICD-10-CM | POA: Diagnosis not present

## 2020-04-27 DIAGNOSIS — I2510 Atherosclerotic heart disease of native coronary artery without angina pectoris: Secondary | ICD-10-CM

## 2020-04-27 DIAGNOSIS — I10 Essential (primary) hypertension: Secondary | ICD-10-CM | POA: Diagnosis not present

## 2020-04-27 DIAGNOSIS — I252 Old myocardial infarction: Secondary | ICD-10-CM | POA: Insufficient documentation

## 2020-04-27 DIAGNOSIS — M069 Rheumatoid arthritis, unspecified: Secondary | ICD-10-CM | POA: Diagnosis not present

## 2020-04-27 DIAGNOSIS — E119 Type 2 diabetes mellitus without complications: Secondary | ICD-10-CM | POA: Insufficient documentation

## 2020-04-27 DIAGNOSIS — K219 Gastro-esophageal reflux disease without esophagitis: Secondary | ICD-10-CM | POA: Diagnosis not present

## 2020-04-27 DIAGNOSIS — D509 Iron deficiency anemia, unspecified: Secondary | ICD-10-CM | POA: Diagnosis not present

## 2020-04-27 DIAGNOSIS — I350 Nonrheumatic aortic (valve) stenosis: Secondary | ICD-10-CM | POA: Diagnosis not present

## 2020-04-27 DIAGNOSIS — M8580 Other specified disorders of bone density and structure, unspecified site: Secondary | ICD-10-CM | POA: Insufficient documentation

## 2020-04-27 DIAGNOSIS — D72829 Elevated white blood cell count, unspecified: Secondary | ICD-10-CM | POA: Insufficient documentation

## 2020-04-27 DIAGNOSIS — M2550 Pain in unspecified joint: Secondary | ICD-10-CM | POA: Insufficient documentation

## 2020-04-27 DIAGNOSIS — D500 Iron deficiency anemia secondary to blood loss (chronic): Secondary | ICD-10-CM | POA: Diagnosis not present

## 2020-04-27 DIAGNOSIS — E538 Deficiency of other specified B group vitamins: Secondary | ICD-10-CM

## 2020-04-27 LAB — CBC WITH DIFFERENTIAL/PLATELET
Abs Immature Granulocytes: 0.07 10*3/uL (ref 0.00–0.07)
Basophils Absolute: 0.1 10*3/uL (ref 0.0–0.1)
Basophils Relative: 1 %
Eosinophils Absolute: 0.5 10*3/uL (ref 0.0–0.5)
Eosinophils Relative: 3 %
HCT: 34.9 % — ABNORMAL LOW (ref 36.0–46.0)
Hemoglobin: 11.3 g/dL — ABNORMAL LOW (ref 12.0–15.0)
Immature Granulocytes: 1 %
Lymphocytes Relative: 12 %
Lymphs Abs: 1.9 10*3/uL (ref 0.7–4.0)
MCH: 28.5 pg (ref 26.0–34.0)
MCHC: 32.4 g/dL (ref 30.0–36.0)
MCV: 88.1 fL (ref 80.0–100.0)
Monocytes Absolute: 1.5 10*3/uL — ABNORMAL HIGH (ref 0.1–1.0)
Monocytes Relative: 10 %
Neutro Abs: 11.2 10*3/uL — ABNORMAL HIGH (ref 1.7–7.7)
Neutrophils Relative %: 73 %
Platelets: 379 10*3/uL (ref 150–400)
RBC: 3.96 MIL/uL (ref 3.87–5.11)
RDW: 18.8 % — ABNORMAL HIGH (ref 11.5–15.5)
WBC: 15.3 10*3/uL — ABNORMAL HIGH (ref 4.0–10.5)
nRBC: 0 % (ref 0.0–0.2)

## 2020-04-27 LAB — CMP (CANCER CENTER ONLY)
ALT: 13 U/L (ref 0–44)
AST: 16 U/L (ref 15–41)
Albumin: 3.2 g/dL — ABNORMAL LOW (ref 3.5–5.0)
Alkaline Phosphatase: 50 U/L (ref 38–126)
Anion gap: 10 (ref 5–15)
BUN: 17 mg/dL (ref 8–23)
CO2: 25 mmol/L (ref 22–32)
Calcium: 10 mg/dL (ref 8.9–10.3)
Chloride: 104 mmol/L (ref 98–111)
Creatinine: 1.06 mg/dL — ABNORMAL HIGH (ref 0.44–1.00)
GFR, Estimated: 52 mL/min — ABNORMAL LOW (ref >60)
Glucose, Bld: 182 mg/dL — ABNORMAL HIGH (ref 70–99)
Potassium: 3.9 mmol/L (ref 3.5–5.1)
Sodium: 139 mmol/L (ref 135–145)
Total Bilirubin: 0.4 mg/dL (ref 0.3–1.2)
Total Protein: 7.3 g/dL (ref 6.5–8.1)

## 2020-04-27 LAB — RETICULOCYTES
Immature Retic Fract: 17.2 % — ABNORMAL HIGH (ref 2.3–15.9)
RBC.: 3.94 MIL/uL (ref 3.87–5.11)
Retic Count, Absolute: 51.6 10*3/uL (ref 19.0–186.0)
Retic Ct Pct: 1.3 % (ref 0.4–3.1)

## 2020-04-27 LAB — IRON AND TIBC
Iron: 32 ug/dL — ABNORMAL LOW (ref 41–142)
Saturation Ratios: 12 % — ABNORMAL LOW (ref 21–57)
TIBC: 273 ug/dL (ref 236–444)
UIBC: 241 ug/dL (ref 120–384)

## 2020-04-27 LAB — VITAMIN B12: Vitamin B-12: 561 pg/mL (ref 180–914)

## 2020-04-27 LAB — FERRITIN: Ferritin: 78 ng/mL (ref 11–307)

## 2020-04-27 NOTE — Progress Notes (Signed)
Marland Kitchen    HEMATOLOGY/ONCOLOGY CLINIC NOTE  Date of Service: 04/27/2020  Patient Care Team: [name], MD as PCP - General (Internal Medicine) Jettie Booze, MD as PCP - Cardiology (Cardiology) Brunetta Genera, MD as Consulting Physician (Hematology) Bo Merino, MD as Consulting Physician (Rheumatology)   CHIEF COMPLAINTS/PURPOSE OF CONSULTATION:  Elevated WBC counts   HISTORY OF PRESENTING ILLNESS:  Sophia Ward is a wonderful 86 y.o. female who has been referred to [place] by Dr .[name], MD for evaluation and management of leucocytosis.  Patient has a h/o RA on MTX and prednisone taper, CAD,htn who was referral for evaluation of leucocytosis. Patient notes that she has had elevated WBC counts for "years".  Based on available labs patient has had chronic neutrophilia and monocytosis since at least 10/2016 with WBC counts ranging form 14k to 22k. Recent labs on 12/6 showed WBC counts of 22k which has since improved to 14.1l on 06/05/2018. hgb is normal at 13.4 and platelets are normal at 331k.  She notes no fevers/chills/nightsweats/or unexpected weight loss. Has joint pain issues from RA.  No other acute new focal symptoms.   INTERVAL HISTORY:  Sophia Ward is a 86 y.o. female here today for follow up and treatment of her leucocytosis and anemia. The patient's last visit with [place] was on 01/06/2020. The pt reports that she is doing well overall.  The pt reports that in the interim she had a coronary stent intervention, aortic valve replacement, and was recently seen in the ED for a fall. Pt believes that she was given IV Iron in the hospital. She has been taking 325 mg Ferrous Sulfate twice per day since late September/early October. She endorses recent constipation and dark stools. Pt continues Plavix & has been placed on Eliquis. Pt had a significant amount of blood loss after her recent fall. She was recently diagnosed with Shingles but is  experiencing a mild presentation. Pt has previously received the Shingles vaccine.  Lab results today (04/27/20) of CBC w/diff and CMP is as follows: all values are WNL except for WBC at 15.3K, Hgb at 11.3, HCT at 34.9, RDW at 18.8, Neutro Abs at 11.2K, Mono Abs at 1.5K, Glucose at 182, Creatinine at 1.06, Albumin at 3.2, GFR Est at 52. 04/27/2020 Reticulocytes shows Retic Ct Pct at 1.3, Retic Ct Abs at 51.6, Immature Retic Fract at 17.2 04/27/2020 Ferritin at 78 04/27/2020 Vitamin B12 at 561 04/27/2020 Iron Panel is as follows: Iron at 32, TIBC at 273, Sat Ratios at 12, UIBC at 241 04/27/2020 Folate RBC is in progress  On review of systems, pt reports constipation, dark stools, nerve pain and denies new fatigue and any other symptoms.   MEDICAL HISTORY:  Past Medical History:  Diagnosis Date  . Allergic rhinitis   . Anemia 09/2019  . Arthritis    hands  . BPPV (benign paroxysmal positional vertigo)   . Coronary artery disease   . Diabetes mellitus without complication ([place])   . Essential hypertension, benign 01/30/2014  . GERD (gastroesophageal reflux disease)   . Hypertension   . Lesion of pancreas   . Mixed hyperlipidemia 01/30/2014  . Myocardial infarction ([place]) 2004   mild MI-no damage- had stents  . Osteopenia   . Post-menopausal   . Pulmonary nodules   . Rheumatoid arthritis ([place])   . S/P TAVR (transcatheter aortic valve replacement) 03/03/2020   s/p TAVR with a 23 mm Edwards S3U via the TF approach by Drs Burt Knack and Roxy Manns  .  Severe aortic stenosis   . Spinal stenosis   . Stenosing tenosynovitis     SURGICAL HISTORY: Past Surgical History:  Procedure Laterality Date  . ABDOMINAL HYSTERECTOMY    . APPENDECTOMY    . BIOPSY  10/25/2019   Procedure: BIOPSY;  Surgeon: Otis Brace, MD;  Location: [place];  Service: Gastroenterology;;  . BIOPSY  10/26/2019   Procedure: BIOPSY;  Surgeon: Otis Brace, MD;  Location: [place] [facility];  Service: Gastroenterology;;  .  CARDIAC CATHETERIZATION  2004  . carpel tunnel    . COLONOSCOPY WITH PROPOFOL N/A 10/30/2012   Procedure: COLONOSCOPY WITH PROPOFOL;  Surgeon: Garlan Fair, MD;  Location: [facility];  Service: Endoscopy;  Laterality: N/A;  . COLONOSCOPY WITH PROPOFOL N/A 10/26/2019   Procedure: COLONOSCOPY WITH PROPOFOL;  Surgeon: Otis Brace, MD;  Location: [place];  Service: Gastroenterology;  Laterality: N/A;  . CORONARY ANGIOPLASTY  2004  . CORONARY ATHERECTOMY N/A 02/21/2020   Procedure: CORONARY ATHERECTOMY;  Surgeon: Jettie Booze, MD;  Location: [place] [facility];  Service: Cardiovascular;  Laterality: N/A;  . CORONARY STENT INTERVENTION N/A 02/21/2020   Procedure: CORONARY STENT INTERVENTION;  Surgeon: Jettie Booze, MD;  Location: [place] [facility];  Service: Cardiovascular;  Laterality: N/A;  . DILATION AND CURETTAGE OF UTERUS    . ESOPHAGOGASTRODUODENOSCOPY N/A 10/25/2019   Procedure: ESOPHAGOGASTRODUODENOSCOPY (EGD);  Surgeon: Otis Brace, MD;  Location[facility];  Service: Gastroenterology;  Laterality: N/A;  . ESOPHAGOGASTRODUODENOSCOPY (EGD) WITH PROPOFOL N/A 10/30/2012   Procedure: ESOPHAGOGASTRODUODENOSCOPY (EGD) WITH PROPOFOL;  Surgeon: Garlan Fair, MD;  Location: [facility];  Service: Endoscopy;  Laterality: N/A;  . EYE SURGERY     bilateral cataract with lens implant  . EYE SURGERY    . INJECTION OF SILICONE OIL Left 6/71/2458   Procedure: Injection Of Silicone Oil;  Surgeon: Jalene Mullet, MD;  Location: [place];  Service: Ophthalmology;  Laterality: Left;  . INTRAVASCULAR PRESSURE WIRE/FFR STUDY N/A 01/29/2020   Procedure: INTRAVASCULAR PRESSURE WIRE/FFR STUDY;  Surgeon: Jettie Booze, MD;  Location: [place] [facility];  Service: Cardiovascular;  Laterality: N/A;  . INTRAVASCULAR ULTRASOUND/IVUS N/A 02/21/2020   Procedure: Intravascular Ultrasound/IVUS;  Surgeon: Jettie Booze, MD;  Location: [place] [facility];  Service:  Cardiovascular;  Laterality: N/A;  . left shouler surgery    . PHOTOCOAGULATION WITH LASER Left 02/12/2019   Procedure: Photocoagulation With Laser;  Surgeon: Jalene Mullet, MD;  Location: [place];  Service: Ophthalmology;  Laterality: Left;  . 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 02/12/2019   Procedure: REPAIR OF COMPLEX TRACTION RETINAL DETACHMENT;  Surgeon: Jalene Mullet, MD;  Location: [place];  Service: Ophthalmology;  Laterality: Left;  . RIGHT/LEFT HEART CATH AND CORONARY ANGIOGRAPHY N/A 01/29/2020   Procedure: RIGHT/LEFT HEART CATH AND CORONARY ANGIOGRAPHY;  Surgeon: Jettie Booze, MD;  Location: [place] [facility];  Service: Cardiovascular;  Laterality: N/A;  . TEE WITHOUT CARDIOVERSION N/A 03/03/2020   Procedure: TRANSESOPHAGEAL ECHOCARDIOGRAM (TEE);  Surgeon: Sherren Mocha, MD;  Location: [place];  Service: Open Heart Surgery;  Laterality: N/A;  . TONSILLECTOMY    . TRANSCATHETER AORTIC VALVE REPLACEMENT, TRANSFEMORAL  03/03/2020  . TRANSCATHETER AORTIC VALVE REPLACEMENT, TRANSFEMORAL N/A 03/03/2020   Procedure: TRANSCATHETER AORTIC VALVE REPLACEMENT, TRANSFEMORAL USING EDWARDS SAPIEN 3 23 MM AORTIC VALVE.;  Surgeon: Sherren Mocha, MD;  Location: [place];  Service: Open Heart Surgery;  Laterality: N/A;  . VITRECTOMY 25 GAUGE WITH SCLERAL BUCKLE Left 02/12/2019   Procedure: Vitrectomy 25 Gauge  With Scleral Buckle;  Surgeon: Jalene Mullet, MD;  Location: [place];  Service: Ophthalmology;  Laterality: Left;    SOCIAL HISTORY: Social History   Socioeconomic History  . Marital status: Married    Spouse name: Not on file  . Number of children: Not on file  . Years of education: Not on file  . Highest education level: Not on file  Occupational History  . Not on file  Tobacco Use  . Smoking status: Never Smoker  . Smokeless tobacco: Never Used  Vaping Use  . Vaping Use: Never used  Substance and Sexual Activity  . Alcohol use: No  . Drug use: No  . Sexual  activity: Not Currently  Other Topics Concern  . Not on file  Social History Narrative  . Not on file   Social Determinants of Health   Financial Resource Strain:   . Difficulty of Paying Living Expenses: Not on file  Food Insecurity:   . Worried About Charity fundraiser in the Last Year: Not on file  . Ran Out of Food in the Last Year: Not on file  Transportation Needs:   . Lack of Transportation (Medical): Not on file  . Lack of Transportation (Non-Medical): Not on file  Physical Activity:   . Days of Exercise per Week: Not on file  . Minutes of Exercise per Session: Not on file  Stress:   . Feeling of Stress : Not on file  Social Connections:   . Frequency of Communication with Friends and Family: Not on file  . Frequency of Social Gatherings with Friends and Family: Not on file  . Attends Religious Services: Not on file  . Active Member of Clubs or Organizations: Not on file  . Attends Archivist Meetings: Not on file  . Marital Status: Not on file  Intimate Partner Violence:   . Fear of Current or Ex-Partner: Not on file  . Emotionally Abused: Not on file  . Physically Abused: Not on file  . Sexually Abused: Not on file    FAMILY HISTORY: Family History  Problem Relation Age of Onset  . CAD Mother   . Heart attack Mother   . CAD Father   . Heart Problems Brother        open heart surgery   . Arthritis Daughter        psoriatic arthritis     ALLERGIES:  is allergic to codeine, glimepiride, jardiance [empagliflozin], metformin and related, and penicillins.   MEDICATIONS:  Current Outpatient Medications  Medication Sig Dispense Refill  . acetaminophen (TYLENOL) 650 MG CR tablet Take 1,300 mg by mouth at bedtime.    Marland Kitchen amLODipine (NORVASC) 10 MG tablet Take 1 tablet (10 mg total) by mouth every morning. 90 tablet 3  . aspirin 81 MG chewable tablet Chew 1 tablet (81 mg total) by mouth daily. 30 tablet 2  . atorvastatin (LIPITOR) 40 MG tablet Take 1  tablet (40 mg total) by mouth at bedtime. 90 tablet 3  . azithromycin (ZITHROMAX) 500 MG tablet Take 1 tablet (500 mg total) by mouth as directed. One tablet by mouth 60 MINUTES before any dental appointment 3 tablet 2  . B-D ULTRAFINE III SHORT PEN 31G X 8 MM MISC 1 each by Other route in the morning, at noon, in the evening, and at bedtime.     . Calcium Carbonate-Vitamin D (CALCIUM + D PO) Take 1 tablet by mouth daily.     . Cholecalciferol (VITAMIN D3)  125 MCG (5000 UT) CAPS Take 5,000 Units by mouth daily.     . clopidogrel (PLAVIX) 75 MG tablet Take 4 tablets (300 mg) by mouth this evening, then take 1 tablet (75 mg) by mouth daily 90 tablet 0  . desonide (DESOWEN) 0.05 % cream Apply 1 application topically 2 (two) times daily as needed.    Marland Kitchen escitalopram (LEXAPRO) 5 MG tablet Take 5 mg by mouth daily.    . ferrous sulfate 325 (65 FE) MG tablet Take 1 tablet (325 mg total) by mouth 2 (two) times daily with a meal. 60 tablet 1  . folic acid (FOLVITE) 1 MG tablet Take 2 tablets (2 mg total) by mouth daily. 180 tablet 2  . HYDROcodone-acetaminophen (NORCO/VICODIN) 5-325 MG tablet Take 1 tablet by mouth every 6 (six) hours as needed for severe pain. 15 tablet 0  . insulin lispro (HUMALOG KWIKPEN) 100 UNIT/ML KiwkPen Inject 6 Units into the skin 3 (three) times daily.     Marland Kitchen ketorolac (ACULAR) 0.4 % SOLN Place 1 drop into the left eye 3 (three) times daily. For 3 months    . LEVEMIR FLEXTOUCH 100 UNIT/ML Pen Inject 14 Units into the skin every morning.   0  . methotrexate 2.5 MG tablet TAKE 5 TABLETS BY MOUTH ONCE A WEEK, CAUTION CHEMOTHERAPY, PROTECT FROM LIGHT 60 tablet 0  . metoprolol succinate (TOPROL-XL) 100 MG 24 hr tablet Take 1 tablet (100 mg total) by mouth every morning. 90 tablet 3  . nitroGLYCERIN (NITROSTAT) 0.4 MG SL tablet DISSOLVE ONE TABLET UNDER THE TONGUE EVERY 5 MINUTES AS NEEDED FOR CHEST PAIN.  DO NOT EXCEED A TOTAL OF 3 DOSES IN 15 MINUTES 25 tablet 5  . Omega-3 Fatty Acids  (FISH OIL PO) Take 1 capsule by mouth daily.     . ondansetron (ZOFRAN ODT) 4 MG disintegrating tablet Take 1 tablet (4 mg total) by mouth every 8 (eight) hours as needed for nausea or vomiting. 20 tablet 0  . pantoprazole (PROTONIX) 40 MG tablet Take 1 tablet (40 mg total) by mouth daily after lunch. 30 tablet 1  . predniSONE (DELTASONE) 1 MG tablet TAKE 4 TABLETS BY MOUTH ONCE DAILY WITH BREAKFAST 360 tablet 0  . ramipril (ALTACE) 10 MG tablet Take 10 mg by mouth 2 (two) times daily.      No current facility-administered medications for this visit.    REVIEW OF SYSTEMS:   A 10+ POINT REVIEW OF SYSTEMS WAS OBTAINED including neurology, dermatology, psychiatry, cardiac, respiratory, lymph, extremities, GI, GU, Musculoskeletal, constitutional, breasts, reproductive, HEENT.  All pertinent positives are noted in the HPI.  All others are negative.   PHYSICAL EXAMINATION: ECOG PERFORMANCE STATUS: 1 - Symptomatic but completely ambulatory  Vitals:   04/27/20 1025  BP: (!) 148/62  Pulse: 79  Resp: 18  Temp: 98.6 F (37 C)  SpO2: 96%   Filed Weights   04/27/20 1025  Weight: 163 lb 3.2 oz (74 kg)   Body mass index is 29.85 kg/m.  Exam was given in a chair   GENERAL:alert, in no acute distress and comfortable SKIN: no acute rashes, no significant lesions EYES: conjunctiva are pink and non-injected, sclera anicteric OROPHARYNX: MMM, no exudates, no oropharyngeal erythema or ulceration NECK: supple, no JVD LYMPH:  no palpable lymphadenopathy in the cervical, axillary or inguinal regions LUNGS: clear to auscultation b/l with normal respiratory effort HEART: regular rate & rhythm ABDOMEN:  normoactive bowel sounds , non tender, not distended. No palpable hepatosplenomegaly.  Extremity:  no pedal edema PSYCH: alert & oriented x 3 with fluent speech NEURO: no focal motor/sensory deficits  LABORATORY DATA:  I have reviewed the data as listed  CBC Latest Ref Rng & Units 04/27/2020  04/02/2020 03/11/2020  WBC 4.0 - 10.5 K/uL 15.3(H) 13.3(H) 13.1(H)  Hemoglobin 12.0 - 15.0 g/dL 11.3(L) 11.8 10.2(L)  Hematocrit 36 - 46 % 34.9(L) 36.8 31.9(L)  Platelets 150 - 400 K/uL 379 307 389   CBC    Component Value Date/Time   WBC 15.3 (H) 04/27/2020 1004   RBC 3.96 04/27/2020 1004   RBC 3.94 04/27/2020 1004   HGB 11.3 (L) 04/27/2020 1004   HGB 11.8 04/02/2020 1527   HCT 34.9 (L) 04/27/2020 1004   HCT 36.8 04/02/2020 1527   PLT 379 04/27/2020 1004   PLT 307 04/02/2020 1527   MCV 88.1 04/27/2020 1004   MCV 85 04/02/2020 1527   MCH 28.5 04/27/2020 1004   MCHC 32.4 04/27/2020 1004   RDW 18.8 (H) 04/27/2020 1004   RDW 16.6 (H) 04/02/2020 1527   LYMPHSABS 1.9 04/27/2020 1004   LYMPHSABS 1.2 04/02/2020 1527   MONOABS 1.5 (H) 04/27/2020 1004   EOSABS 0.5 04/27/2020 1004   EOSABS 0.3 04/02/2020 1527   BASOSABS 0.1 04/27/2020 1004   BASOSABS 0.1 04/02/2020 1527    . CMP Latest Ref Rng & Units 04/27/2020 03/11/2020 03/04/2020  Glucose 70 - 99 mg/dL 182(H) 102(H) 106(H)  BUN 8 - 23 mg/dL 17 17 12   Creatinine 0.44 - 1.00 mg/dL 1.06(H) 1.00 1.05(H)  Sodium 135 - 145 mmol/L 139 141 136  Potassium 3.5 - 5.1 mmol/L 3.9 4.5 3.9  Chloride 98 - 111 mmol/L 104 103 105  CO2 22 - 32 mmol/L 25 24 22   Calcium 8.9 - 10.3 mg/dL 10.0 9.8 8.5(L)  Total Protein 6.5 - 8.1 g/dL 7.3 - -  Total Bilirubin 0.3 - 1.2 mg/dL 0.4 - -  Alkaline Phos 38 - 126 U/L 50 - -  AST 15 - 41 U/L 16 - -  ALT 0 - 44 U/L 13 - -   Lab Results  Component Value Date   LDH 237 (H) 01/06/2020   Sed rate 14  . Lab Results  Component Value Date   IRON 32 (L) 04/27/2020   TIBC 273 04/27/2020   IRONPCTSAT 12 (L) 04/27/2020   (Iron and TIBC)  Lab Results  Component Value Date   FERRITIN 78 04/27/2020     06/05/2018 [facility] FISH BCR/ABL    06/05/2018 [facility] OnkoSite NGS JAK2 MPL and CALR    RADIOGRAPHIC STUDIES: I have personally reviewed the radiological images as listed and agreed with the  findings in the report. DG Shoulder Right  Result Date: 04/18/2020 CLINICAL DATA:  Status post fall. EXAM: RIGHT SHOULDER - 2+ VIEW COMPARISON:  None. FINDINGS: Acute fracture is seen involving the surgical neck of the proximal right humerus and adjacent portion of the right humeral head. There is no evidence of dislocation. Mild degenerative changes are seen involving the right acromioclavicular joint and right glenohumeral articulation. A mild amount of lobulated soft tissue calcification is seen adjacent to the inferolateral aspect of the right acromion. IMPRESSION: 1. Acute fracture of the proximal right humerus. Electronically Signed   By: Virgina Norfolk M.D.   On: 04/18/2020 20:21   CT Head Wo Contrast  Result Date: 04/18/2020 CLINICAL DATA:  Fall, on anticoagulation, no loss of consciousness or reported head strike. EXAM: CT HEAD WITHOUT CONTRAST CT CERVICAL SPINE WITHOUT CONTRAST TECHNIQUE: Multidetector  CT imaging of the head and cervical spine was performed following the standard protocol without intravenous contrast. Multiplanar CT image reconstructions of the cervical spine were also generated. COMPARISON:  None. FINDINGS: CT HEAD FINDINGS Brain: Small hypoattenuating foci in the bilateral basal ganglia, possibly sequela of remote lacunar infarcts or prominent perivascular. No evidence of acute infarction, hemorrhage, hydrocephalus, extra-axial collection, visible mass lesion or mass effect. Symmetric prominence of the ventricles, cisterns and sulci compatible with parenchymal volume loss. Patchy areas of Ward matter hypoattenuation are most compatible with chronic microvascular angiopathy. Senescent mineralization of the basal ganglia. Few tiny benign parafalcine lipomas and dural calcifications. Vascular: Atherosclerotic calcification of the carotid siphons and intradural vertebral arteries. No hyperdense vessel. Skull: No calvarial fracture or suspicious osseous lesion. No scalp swelling  or hematoma. Sinuses/Orbits: Paranasal sinuses and mastoid air cells are predominantly clear. Prior left scleral buckle and bilateral lens extractions. Included orbital contents are otherwise unremarkable. Other: Bilateral TMJ arthrosis. CT CERVICAL SPINE FINDINGS Alignment: Stabilization collar is absent at the time of examination. Slight leftward cranial rotation. Mild leftward lateral flexion of the neck as well. No evidence of traumatic listhesis. No abnormally widened, perched or jumped facets. Normal alignment of the craniocervical and atlantoaxial articulations. Skull base and vertebrae: No acute skull base fracture. No vertebral body fracture or height loss. Normal bone mineralization. No worrisome osseous lesions. Moderate arthrosis of the atlantodental and basion dens intervals. Cervical spondylitic changes further detailed below. Soft tissues and spinal canal: No pre or paravertebral fluid or swelling. No visible canal hematoma. Disc levels: Multilevel intervertebral disc height loss with spondylitic endplate changes most pronounced C3-4, C5-6. Disc osteophyte complexes in slight ligamentum flavum hypertrophy result in mild canal stenosis C3-4, moderate stenosis C4-5 and C5-6 with partial effacement of the ventral thecal sac C6-7 as well. Multilevel uncinate spurring facet hypertrophic changes are present as well resulting in mild bilateral foraminal narrowing C3-4 and C5-6. Upper chest: No acute abnormality in the upper chest or imaged lung apices. Other: Cervical carotid atherosclerosis most pronounced at the bifurcations. Some additional calcification of the proximal great vessels. Few small subcentimeter nodules present in the thyroid gland without distinct enlargement of the gland. Likely a multinodular goiter, unlikely to be clinically significant; no routine follow-up imaging recommended (ref: J Am Coll Radiol. 2015 Feb;12(2): 143-50). IMPRESSION: 1. No acute intracranial abnormality. No scalp  swelling or calvarial fracture. 2. Small hypoattenuating foci in the bilateral basal ganglia, possibly sequela of remote lacunar infarcts or prominent perivascular. 3. Chronic microvascular ischemic Ward matter disease and mild parenchymal volume loss. 4. No evidence of acute fracture or traumatic listhesis of the cervical spine. 5. Multilevel degenerative changes of the cervical spine as described above. 6. Cervical and intracranial atherosclerosis. Electronically Signed   By: Lovena Le M.D.   On: 04/18/2020 20:55   CT Cervical Spine Wo Contrast  Result Date: 04/18/2020 CLINICAL DATA:  Fall, on anticoagulation, no loss of consciousness or reported head strike. EXAM: CT HEAD WITHOUT CONTRAST CT CERVICAL SPINE WITHOUT CONTRAST TECHNIQUE: Multidetector CT imaging of the head and cervical spine was performed following the standard protocol without intravenous contrast. Multiplanar CT image reconstructions of the cervical spine were also generated. COMPARISON:  None. FINDINGS: CT HEAD FINDINGS Brain: Small hypoattenuating foci in the bilateral basal ganglia, possibly sequela of remote lacunar infarcts or prominent perivascular. No evidence of acute infarction, hemorrhage, hydrocephalus, extra-axial collection, visible mass lesion or mass effect. Symmetric prominence of the ventricles, cisterns and sulci compatible with parenchymal volume loss.  Patchy areas of Ward matter hypoattenuation are most compatible with chronic microvascular angiopathy. Senescent mineralization of the basal ganglia. Few tiny benign parafalcine lipomas and dural calcifications. Vascular: Atherosclerotic calcification of the carotid siphons and intradural vertebral arteries. No hyperdense vessel. Skull: No calvarial fracture or suspicious osseous lesion. No scalp swelling or hematoma. Sinuses/Orbits: Paranasal sinuses and mastoid air cells are predominantly clear. Prior left scleral buckle and bilateral lens extractions. Included orbital  contents are otherwise unremarkable. Other: Bilateral TMJ arthrosis. CT CERVICAL SPINE FINDINGS Alignment: Stabilization collar is absent at the time of examination. Slight leftward cranial rotation. Mild leftward lateral flexion of the neck as well. No evidence of traumatic listhesis. No abnormally widened, perched or jumped facets. Normal alignment of the craniocervical and atlantoaxial articulations. Skull base and vertebrae: No acute skull base fracture. No vertebral body fracture or height loss. Normal bone mineralization. No worrisome osseous lesions. Moderate arthrosis of the atlantodental and basion dens intervals. Cervical spondylitic changes further detailed below. Soft tissues and spinal canal: No pre or paravertebral fluid or swelling. No visible canal hematoma. Disc levels: Multilevel intervertebral disc height loss with spondylitic endplate changes most pronounced C3-4, C5-6. Disc osteophyte complexes in slight ligamentum flavum hypertrophy result in mild canal stenosis C3-4, moderate stenosis C4-5 and C5-6 with partial effacement of the ventral thecal sac C6-7 as well. Multilevel uncinate spurring facet hypertrophic changes are present as well resulting in mild bilateral foraminal narrowing C3-4 and C5-6. Upper chest: No acute abnormality in the upper chest or imaged lung apices. Other: Cervical carotid atherosclerosis most pronounced at the bifurcations. Some additional calcification of the proximal great vessels. Few small subcentimeter nodules present in the thyroid gland without distinct enlargement of the gland. Likely a multinodular goiter, unlikely to be clinically significant; no routine follow-up imaging recommended (ref: J Am Coll Radiol. 2015 Feb;12(2): 143-50). IMPRESSION: 1. No acute intracranial abnormality. No scalp swelling or calvarial fracture. 2. Small hypoattenuating foci in the bilateral basal ganglia, possibly sequela of remote lacunar infarcts or prominent perivascular. 3.  Chronic microvascular ischemic Ward matter disease and mild parenchymal volume loss. 4. No evidence of acute fracture or traumatic listhesis of the cervical spine. 5. Multilevel degenerative changes of the cervical spine as described above. 6. Cervical and intracranial atherosclerosis. Electronically Signed   By: Lovena Le M.D.   On: 04/18/2020 20:55   DG Humerus Right  Result Date: 04/18/2020 CLINICAL DATA:  Status post fall. EXAM: RIGHT HUMERUS - 2+ VIEW COMPARISON:  None. FINDINGS: Acute fracture deformity is seen extending through the surgical neck of the proximal right humerus and adjacent portion of the right humeral head. There is no evidence of dislocation. Mild lobulated soft tissue calcification is seen adjacent to the inferolateral aspect of the right acromion. IMPRESSION: Acute fracture of the proximal right humerus. Electronically Signed   By: Virgina Norfolk M.D.   On: 04/18/2020 20:22   ECHOCARDIOGRAM COMPLETE  Result Date: 04/02/2020    ECHOCARDIOGRAM REPORT   Patient Name:   SHAKA ZECH Date of Exam: 04/02/2020 Medical Rec #:  [id number]          Height:       62.0 in Accession #:    [id number]         Weight:       169.2 lb Date of Birth:  Feb 14, 1933         BSA:          1.781 m Patient Age:    19 years  BP:           169/80 mmHg Patient Gender: F                  HR:           72 bpm. Exam Location:  [address] Procedure: 2D Echo, Cardiac Doppler and Color Doppler Indications:    Z95.2 Post TAVR  History:        Patient has prior history of Echocardiogram examinations, most                 recent 03/04/2020. CHF, CAD and Previous Myocardial Infarction;                 Arrythmias:Atrial Fibrillation.  Sonographer:    Marygrace Drought RCS Referring Phys: [id number] [place]  1. Left ventricular ejection fraction, by estimation, is 60 to 65%. The left ventricle has normal function. The left ventricle has no regional wall motion abnormalities. Left  ventricular diastolic parameters are consistent with Grade II diastolic dysfunction (pseudonormalization).  2. Right ventricular systolic function is mildly reduced. The right ventricular size is normal. There is mildly elevated pulmonary artery systolic pressure. The estimated right ventricular systolic pressure is 31.5 mmHg.  3. Left atrial size was mildly dilated.  4. The mitral valve is normal in structure. Trivial mitral valve regurgitation. No evidence of mitral stenosis.  5. Bioprosthetic aortic valve s/p TAVR. Mean gradient 14 mmHg. No significant peri-valvular leakage.  6. The inferior vena cava is normal in size with greater than 50% respiratory variability, suggesting right atrial pressure of 3 mmHg.  7. Small, primarily posterior pericardial effusion. FINDINGS  Left Ventricle: Left ventricular ejection fraction, by estimation, is 60 to 65%. The left ventricle has normal function. The left ventricle has no regional wall motion abnormalities. The left ventricular internal cavity size was normal in size. There is  no left ventricular hypertrophy. Left ventricular diastolic parameters are consistent with Grade II diastolic dysfunction (pseudonormalization). Right Ventricle: The right ventricular size is normal. No increase in right ventricular wall thickness. Right ventricular systolic function is mildly reduced. There is mildly elevated pulmonary artery systolic pressure. The tricuspid regurgitant velocity  is 2.89 m/s, and with an assumed right atrial pressure of 3 mmHg, the estimated right ventricular systolic pressure is 17.6 mmHg. Left Atrium: Left atrial size was mildly dilated. Right Atrium: Right atrial size was normal in size. Pericardium: Small, primarily posterior pericardial effusion. Mitral Valve: The mitral valve is normal in structure. Mild to moderate mitral annular calcification. Trivial mitral valve regurgitation. No evidence of mitral valve stenosis. Tricuspid Valve: The tricuspid valve is  normal in structure. Tricuspid valve regurgitation is mild. Aortic Valve: Bioprosthetic aortic valve s/p TAVR. Mean gradient 14 mmHg. No significant peri-valvular leakage. The aortic valve has been repaired/replaced. Aortic valve regurgitation is not visualized. Aortic valve mean gradient measures 14.0 mmHg. Aortic valve peak gradient measures 25.2 mmHg. Aortic valve area, by VTI measures 1.15 cm. There is a 23 mm Sapien prosthetic, stented (TAVR) valve present in the aortic position. Pulmonic Valve: The pulmonic valve was normal in structure. Pulmonic valve regurgitation is trivial. Aorta: The aortic root is normal in size and structure. Venous: The inferior vena cava is normal in size with greater than 50% respiratory variability, suggesting right atrial pressure of 3 mmHg. IAS/Shunts: No atrial level shunt detected by color flow Doppler.  LEFT VENTRICLE PLAX 2D LVIDd:         5.50 cm  Diastology LVIDs:  3.70 cm  LV e' medial:    4.46 cm/s LV PW:         1.20 cm  LV E/e' medial:  30.5 LV IVS:        1.10 cm  LV e' lateral:   6.53 cm/s LVOT diam:     1.80 cm  LV E/e' lateral: 20.8 LV SV:         70 LV SV Index:   39 LVOT Area:     2.54 cm  RIGHT VENTRICLE RV Basal diam:  3.70 cm RV S prime:     9.90 cm/s TAPSE (M-mode): 1.8 cm RVSP:           36.4 mmHg LEFT ATRIUM             Index       RIGHT ATRIUM           Index LA diam:        4.90 cm 2.75 cm/m  RA Pressure: 3.00 mmHg LA Vol (A2C):   51.7 ml 29.04 ml/m RA Area:     17.00 cm LA Vol (A4C):   53.0 ml 29.77 ml/m RA Volume:   44.80 ml  25.16 ml/m LA Biplane Vol: 54.0 ml 30.33 ml/m  AORTIC VALVE AV Area (Vmax):    1.14 cm AV Area (Vmean):   1.15 cm AV Area (VTI):     1.15 cm AV Vmax:           251.00 cm/s AV Vmean:          179.000 cm/s AV VTI:            0.609 m AV Peak Grad:      25.2 mmHg AV Mean Grad:      14.0 mmHg LVOT Vmax:         112.00 cm/s LVOT Vmean:        81.000 cm/s LVOT VTI:          0.276 m LVOT/AV VTI ratio: 0.45  AORTA Ao Root  diam: 2.50 cm Ao Asc diam:  2.80 cm MITRAL VALVE                TRICUSPID VALVE MV Area (PHT):              TR Peak grad:   33.4 mmHg MV Decel Time:              TR Vmax:        289.00 cm/s MV E velocity: 136.00 cm/s  Estimated RAP:  3.00 mmHg MV A velocity: 107.00 cm/s  RVSP:           36.4 mmHg MV E/A ratio:  1.27                             SHUNTS                             Systemic VTI:  0.28 m                             Systemic Diam: 1.80 cm Loralie Champagne MD Electronically signed by Loralie Champagne MD Signature Date/Time: 04/02/2020/8:39:49 PM    Final      ASSESSMENT & PLAN:   86 yo with    1) Chronic leucocytosis from atleast 10/2016 in the 14-22k range. Primarily  neutrophilia and monocytosis. Cannot r/o CMML. R/o CML neg for BCR ABL Reactive process due to inflammation from RA Does on report any recent fevers or fevers. -Clonal markers neg for BCR-ABL, Jak2,MPL and CALR   2) Microcytic Anemia due to IDA and anemia of chronic disease  PLAN: -Discussed pt labwork today, 04/27/20; Hgb is stable, WBC are okay, PLT are nml, blood chemistries are stable, Ferritin & B12 are okay, Sat Ratios are low, Folate RBC is in progress. -Advised pt that the dark stools caused by PO Iron will make it more difficult to monitor for GI bleeding.  -Advised pt that it will take much longer to replace iron orally & PO Iron may not be able to keep up with blood loss. -Recommend we hold PO Iron and continue IV Iron prn to prevent dark stools & constipation - pt prefers to continue PO Iron.  -Advised pt that Methotrexate and chronic acid suppression can contribute to anemia.  -Advised pt that full dose Eliquis will increase her risk of bleeding.  -Recommend pt f/u with Dr. Irish Lack for anticoagulation management. -Recommend pt continue 325 mg Ferrous Sulfate BID -Continue daily B-complex -Will see back in 4 months with labs    FOLLOW UP: RTC with Dr Irene Limbo with labs in 4 months   The total time spent in  the appt was 20 minutes and more than 50% was on counseling and direct patient cares.  All of the patient's questions were answered with apparent satisfaction. The patient knows to call the clinic with any problems, questions or concerns.   Sullivan Lone MD [place] AAHIVMS [facility] Hematology/Oncology Physician [facility]  (Office):       [phone] (Work cell):  [phone]  (Fax):           [phone]  04/27/2020 1:17 PM  I, Yevette Edwards, am acting as a scribe for Dr. Sullivan Lone.   .I have reviewed the above documentation for accuracy and completeness, and I agree with the above. Brunetta Genera MD

## 2020-04-28 LAB — FOLATE RBC
Folate, Hemolysate: 504 ng/mL
Folate, RBC: 1416 ng/mL (ref >498)
Hematocrit: 35.6 % (ref 34.0–46.6)

## 2020-05-01 DIAGNOSIS — H53482 Generalized contraction of visual field, left eye: Secondary | ICD-10-CM | POA: Diagnosis not present

## 2020-05-01 DIAGNOSIS — M0570 Rheumatoid arthritis with rheumatoid factor of unspecified site without organ or systems involvement: Secondary | ICD-10-CM | POA: Diagnosis not present

## 2020-05-01 DIAGNOSIS — H52223 Regular astigmatism, bilateral: Secondary | ICD-10-CM | POA: Diagnosis not present

## 2020-05-01 DIAGNOSIS — H33052 Total retinal detachment, left eye: Secondary | ICD-10-CM | POA: Diagnosis not present

## 2020-05-01 DIAGNOSIS — H35371 Puckering of macula, right eye: Secondary | ICD-10-CM | POA: Diagnosis not present

## 2020-05-01 DIAGNOSIS — E119 Type 2 diabetes mellitus without complications: Secondary | ICD-10-CM | POA: Diagnosis not present

## 2020-05-01 DIAGNOSIS — Z961 Presence of intraocular lens: Secondary | ICD-10-CM | POA: Diagnosis not present

## 2020-05-06 ENCOUNTER — Other Ambulatory Visit: Payer: Self-pay

## 2020-05-06 ENCOUNTER — Encounter: Payer: Self-pay | Admitting: Rheumatology

## 2020-05-06 ENCOUNTER — Ambulatory Visit (INDEPENDENT_AMBULATORY_CARE_PROVIDER_SITE_OTHER): Payer: Medicare Other | Admitting: Rheumatology

## 2020-05-06 VITALS — BP 112/69 | HR 83 | Resp 16 | Ht 62.5 in | Wt 162.0 lb

## 2020-05-06 DIAGNOSIS — Z79899 Other long term (current) drug therapy: Secondary | ICD-10-CM

## 2020-05-06 DIAGNOSIS — Z8781 Personal history of (healed) traumatic fracture: Secondary | ICD-10-CM

## 2020-05-06 DIAGNOSIS — M170 Bilateral primary osteoarthritis of knee: Secondary | ICD-10-CM | POA: Diagnosis not present

## 2020-05-06 DIAGNOSIS — Z8639 Personal history of other endocrine, nutritional and metabolic disease: Secondary | ICD-10-CM | POA: Diagnosis not present

## 2020-05-06 DIAGNOSIS — M353 Polymyalgia rheumatica: Secondary | ICD-10-CM

## 2020-05-06 DIAGNOSIS — M8589 Other specified disorders of bone density and structure, multiple sites: Secondary | ICD-10-CM

## 2020-05-06 DIAGNOSIS — Z7189 Other specified counseling: Secondary | ICD-10-CM

## 2020-05-06 DIAGNOSIS — M0579 Rheumatoid arthritis with rheumatoid factor of multiple sites without organ or systems involvement: Secondary | ICD-10-CM

## 2020-05-06 DIAGNOSIS — M19071 Primary osteoarthritis, right ankle and foot: Secondary | ICD-10-CM

## 2020-05-06 DIAGNOSIS — Z8719 Personal history of other diseases of the digestive system: Secondary | ICD-10-CM | POA: Diagnosis not present

## 2020-05-06 DIAGNOSIS — M7061 Trochanteric bursitis, right hip: Secondary | ICD-10-CM

## 2020-05-06 DIAGNOSIS — M19042 Primary osteoarthritis, left hand: Secondary | ICD-10-CM

## 2020-05-06 DIAGNOSIS — Z8679 Personal history of other diseases of the circulatory system: Secondary | ICD-10-CM

## 2020-05-06 DIAGNOSIS — I2510 Atherosclerotic heart disease of native coronary artery without angina pectoris: Secondary | ICD-10-CM

## 2020-05-06 DIAGNOSIS — M19072 Primary osteoarthritis, left ankle and foot: Secondary | ICD-10-CM

## 2020-05-06 DIAGNOSIS — Z7901 Long term (current) use of anticoagulants: Secondary | ICD-10-CM

## 2020-05-06 DIAGNOSIS — M19041 Primary osteoarthritis, right hand: Secondary | ICD-10-CM

## 2020-05-06 NOTE — Patient Instructions (Addendum)
Standing Labs We placed an order today for your standing lab work.   Please have your standing labs drawn in February ( CBC with diff and CMP) and every 3 months  If possible, please have your labs drawn 2 weeks prior to your appointment so that the provider can discuss your results at your appointment.  We have open lab daily Monday through Thursday from 8:30-12:30 PM and 1:30-4:30 PM and Friday from 8:30-12:30 PM and 1:30-4:00 PM at the office of Dr. Bo Merino, [facility].   Please be advised, patients with office appointments requiring lab work will take precedents over walk-in lab work.  If possible, please come for your lab work on Monday and Friday afternoons, as you may experience shorter wait times. The office is located at [address] [place], [place], [place] [address] No appointment is necessary.   Labs are drawn by [facility]. Please bring your co-pay at the time of your lab draw.  You may receive a bill from [place] for your lab work.  If you wish to have your labs drawn at another location, please call the office 24 hours in advance to send orders.  If you have any questions regarding directions or hours of operation,  please call [phone].   As a reminder, please drink plenty of water prior to coming for your lab work. Thanks!  COVID-19 vaccine recommendations:   COVID-19 vaccine is recommended for everyone (unless you are allergic to a vaccine component), even if you are on a medication that suppresses your immune system.   If you are on Methotrexate, Cellcept (mycophenolate), Rinvoq, Morrie Sheldon, and Olumiant- hold the medication for 1 week after each vaccine. Hold Methotrexate for 2 weeks after the single dose COVID-19 vaccine.   Do not take Tylenol or any anti-inflammatory medications (NSAIDs) 24 hours prior to the COVID-19 vaccination.   There is no direct evidence about the efficacy of the COVID-19 vaccine in individuals who are on medications  that suppress the immune system.   Even if you are fully vaccinated, and you are on any medications that suppress your immune system, please continue to wear a mask, maintain at least six feet social distance and practice hand hygiene.   If you develop a COVID-19 infection, please contact your PCP or our office to determine if you need monoclonal antibody infusion.  The booster vaccine is now available for immunocompromised patients.   Please see the following web sites for updated information.   [email or web address]

## 2020-05-08 DIAGNOSIS — S42251A Displaced fracture of greater tuberosity of right humerus, initial encounter for closed fracture: Secondary | ICD-10-CM | POA: Diagnosis not present

## 2020-05-08 DIAGNOSIS — M25511 Pain in right shoulder: Secondary | ICD-10-CM | POA: Diagnosis not present

## 2020-05-11 ENCOUNTER — Other Ambulatory Visit: Payer: Self-pay | Admitting: Physician Assistant

## 2020-05-11 NOTE — Telephone Encounter (Signed)
Last Visit: 05/06/2020 Next Visit: 10/16/2020 Labs: 04/27/2020 Glucose 182, Creat. 1.06, GFR 52, Albumin 3.2, Hgb 11.3 Hct 34.9, RDW 18.8, Neutro Abs. 11.2, Monocytes Absolute 1.5  Current Dose per office note 05/06/2020: Methotrexate 2.5 mg 5 tablet every 7 days  DX:  Rheumatoid arthritis involving multiple sites with positive rheumatoid factor   Okay to refill MTX?

## 2020-05-13 DIAGNOSIS — I10 Essential (primary) hypertension: Secondary | ICD-10-CM | POA: Diagnosis not present

## 2020-05-13 DIAGNOSIS — M8588 Other specified disorders of bone density and structure, other site: Secondary | ICD-10-CM | POA: Diagnosis not present

## 2020-05-13 DIAGNOSIS — E782 Mixed hyperlipidemia: Secondary | ICD-10-CM | POA: Diagnosis not present

## 2020-05-13 DIAGNOSIS — I480 Paroxysmal atrial fibrillation: Secondary | ICD-10-CM | POA: Diagnosis not present

## 2020-05-13 DIAGNOSIS — E1122 Type 2 diabetes mellitus with diabetic chronic kidney disease: Secondary | ICD-10-CM | POA: Diagnosis not present

## 2020-05-13 DIAGNOSIS — M069 Rheumatoid arthritis, unspecified: Secondary | ICD-10-CM | POA: Diagnosis not present

## 2020-05-13 DIAGNOSIS — M353 Polymyalgia rheumatica: Secondary | ICD-10-CM | POA: Diagnosis not present

## 2020-05-13 DIAGNOSIS — K219 Gastro-esophageal reflux disease without esophagitis: Secondary | ICD-10-CM | POA: Diagnosis not present

## 2020-05-13 DIAGNOSIS — I252 Old myocardial infarction: Secondary | ICD-10-CM | POA: Diagnosis not present

## 2020-05-13 DIAGNOSIS — M109 Gout, unspecified: Secondary | ICD-10-CM | POA: Diagnosis not present

## 2020-05-13 DIAGNOSIS — I2510 Atherosclerotic heart disease of native coronary artery without angina pectoris: Secondary | ICD-10-CM | POA: Diagnosis not present

## 2020-05-13 DIAGNOSIS — S51819A Laceration without foreign body of unspecified forearm, initial encounter: Secondary | ICD-10-CM | POA: Diagnosis not present

## 2020-05-18 DIAGNOSIS — N390 Urinary tract infection, site not specified: Secondary | ICD-10-CM | POA: Diagnosis not present

## 2020-06-01 ENCOUNTER — Telehealth: Payer: Self-pay | Admitting: Hematology

## 2020-06-01 NOTE — Telephone Encounter (Signed)
Left message to reschedule appointment per 1/3 schedule message. Gave option to call back to reschedule. 

## 2020-06-04 ENCOUNTER — Encounter: Payer: Self-pay | Admitting: Physical Therapy

## 2020-06-04 ENCOUNTER — Ambulatory Visit: Payer: Medicare Other | Attending: Orthopaedic Surgery | Admitting: Physical Therapy

## 2020-06-04 ENCOUNTER — Other Ambulatory Visit: Payer: Self-pay

## 2020-06-04 DIAGNOSIS — R600 Localized edema: Secondary | ICD-10-CM

## 2020-06-04 DIAGNOSIS — M25511 Pain in right shoulder: Secondary | ICD-10-CM

## 2020-06-04 DIAGNOSIS — M25611 Stiffness of right shoulder, not elsewhere classified: Secondary | ICD-10-CM | POA: Diagnosis not present

## 2020-06-04 DIAGNOSIS — R252 Cramp and spasm: Secondary | ICD-10-CM

## 2020-06-04 NOTE — Patient Instructions (Signed)
Access Code: [email or web address] URL: [email or web address] Date: 06/04/2020 Prepared by: Lysle Rubens  Exercises Seated Shoulder Flexion Towel Slide at Table Top - 1 x daily - 7 x weekly - 3 sets - 10 reps - 5-10 sec hold Seated Shoulder Abduction Towel Slide at Table Top - 1 x daily - 7 x weekly - 3 sets - 10 reps - 5-10 sec hold Isometric Shoulder Flexion at Wall - 1 x daily - 7 x weekly - 3 sets - 10 reps - 3 sec hold Isometric Shoulder Extension at Wall - 1 x daily - 7 x weekly - 3 sets - 10 reps - 3 sec hold Isometric Shoulder Abduction at Wall - 1 x daily - 7 x weekly - 3 sets - 10 reps - 3 sec hold

## 2020-06-04 NOTE — Therapy (Signed)
[facility] [place], [place], [address] Phone: [phone]   Fax:  [phone]  Physical Therapy Evaluation  Patient Details  Name: Sophia Ward MRN: [id number] Date of Birth: 1933-05-01 Referring Provider (PT): Lysle Rubens   Encounter Date: 06/04/2020   PT End of Session - 06/04/20 1659    Visit Number 1    Date for PT Re-Evaluation 08/02/20    PT Start Time [id number]    PT Stop Time 1655    PT Time Calculation (min) 45 min    Activity Tolerance Patient tolerated treatment well;Patient limited by pain    Behavior During Therapy [facility] for tasks assessed/performed           Past Medical History:  Diagnosis Date  . Allergic rhinitis   . Anemia 09/2019  . Arthritis    hands  . BPPV (benign paroxysmal positional vertigo)   . Coronary artery disease   . Diabetes mellitus without complication ([place])   . Essential hypertension, benign 01/30/2014  . GERD (gastroesophageal reflux disease)   . Hypertension   . Lesion of pancreas   . Mixed hyperlipidemia 01/30/2014  . Myocardial infarction ([place]) 2004   mild MI-no damage- had stents  . Osteopenia   . Post-menopausal   . Pulmonary nodules   . Rheumatoid arthritis ([place])   . S/P TAVR (transcatheter aortic valve replacement) 03/03/2020   s/p TAVR with a 23 mm Edwards S3U via the TF approach by Drs Burt Knack and Roxy Manns  . Severe aortic stenosis   . Spinal stenosis   . Stenosing tenosynovitis     Past Surgical History:  Procedure Laterality Date  . ABDOMINAL HYSTERECTOMY    . APPENDECTOMY    . BIOPSY  10/25/2019   Procedure: BIOPSY;  Surgeon: Otis Brace, MD;  Location: [facility];  Service: Gastroenterology;;  . BIOPSY  10/26/2019   Procedure: BIOPSY;  Surgeon: Otis Brace, MD;  Location: [facility];  Service: Gastroenterology;;  . CARDIAC CATHETERIZATION  2004  . carpel tunnel    . COLONOSCOPY WITH PROPOFOL N/A 10/30/2012   Procedure: COLONOSCOPY WITH  PROPOFOL;  Surgeon: Garlan Fair, MD;  Location: [facility];  Service: Endoscopy;  Laterality: N/A;  . COLONOSCOPY WITH PROPOFOL N/A 10/26/2019   Procedure: COLONOSCOPY WITH PROPOFOL;  Surgeon: Otis Brace, MD;  Location: [facility];  Service: Gastroenterology;  Laterality: N/A;  . CORONARY ANGIOPLASTY  2004  . CORONARY ATHERECTOMY N/A 02/21/2020   Procedure: CORONARY ATHERECTOMY;  Surgeon: Jettie Booze, MD;  Location: [facility];  Service: Cardiovascular;  Laterality: N/A;  . CORONARY STENT INTERVENTION N/A 02/21/2020   Procedure: CORONARY STENT INTERVENTION;  Surgeon: Jettie Booze, MD;  Location: [facility] CV [facility];  Service: Cardiovascular;  Laterality: N/A;  . DILATION AND CURETTAGE OF UTERUS    . ESOPHAGOGASTRODUODENOSCOPY N/A 10/25/2019   Procedure: ESOPHAGOGASTRODUODENOSCOPY (EGD);  Surgeon: Otis Brace, MD;  Location[facility];  Service: Gastroenterology;  Laterality: N/A;  . ESOPHAGOGASTRODUODENOSCOPY (EGD) WITH PROPOFOL N/A 10/30/2012   Procedure: ESOPHAGOGASTRODUODENOSCOPY (EGD) WITH PROPOFOL;  Surgeon: Garlan Fair, MD;  Location: [facility];  Service: Endoscopy;  Laterality: N/A;  . EYE SURGERY     bilateral cataract with lens implant  . EYE SURGERY    . INJECTION OF SILICONE OIL Left [id number]   Procedure: Injection Of Silicone Oil;  Surgeon: Jalene [place], MD;  Location: [facility];  Service: Ophthalmology;  Laterality: Left;  . INTRAVASCULAR PRESSURE WIRE/FFR STUDY N/A 01/29/2020   Procedure: INTRAVASCULAR  PRESSURE WIRE/FFR STUDY;  Surgeon: Jettie Booze, MD;  Location: [facility] CV LAB;  Service: Cardiovascular;  Laterality: N/A;  . INTRAVASCULAR ULTRASOUND/IVUS N/A 02/21/2020   Procedure: Intravascular Ultrasound/IVUS;  Surgeon: Jettie Booze, MD;  Location: [facility] CV [facility];  Service: Cardiovascular;  Laterality: N/A;  . left shouler surgery    . PHOTOCOAGULATION WITH LASER Left 02/12/2019   Procedure:  Photocoagulation With Laser;  Surgeon: Jalene [place], MD;  Location: [facility];  Service: Ophthalmology;  Laterality: Left;  . 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 RIGHT/LEFT HEART CATH AND CORONARY ANGIOGRAPHY N/A 01/29/2020   Procedure: RIGHT/LEFT HEART CATH AND CORONARY ANGIOGRAPHY;  Surgeon: Jettie Booze, MD;  Location: [place] [facility];  Service: Cardiovascular;  Laterality: N/A;  . TEE WITHOUT CARDIOVERSION N/A 03/03/2020   Procedure: TRANSESOPHAGEAL ECHOCARDIOGRAM (TEE);  Surgeon: Sherren Mocha, MD;  Location: [facility];  Service: Open Heart Surgery;  Laterality: N/A;  . TONSILLECTOMY    . TRANSCATHETER AORTIC VALVE REPLACEMENT, TRANSFEMORAL  03/03/2020  . TRANSCATHETER AORTIC VALVE REPLACEMENT, TRANSFEMORAL N/A 03/03/2020   Procedure: TRANSCATHETER AORTIC VALVE REPLACEMENT, TRANSFEMORAL USING EDWARDS SAPIEN 3 23 MM AORTIC VALVE.;  Surgeon: Sherren Mocha, MD;  Location: [facility];  Service: Open Heart Surgery;  Laterality: N/A;  . VITRECTOMY 25 GAUGE WITH SCLERAL BUCKLE Left 02/12/2019   Procedure: Vitrectomy 25 Gauge With Scleral Buckle;  Surgeon: Jalene [place], MD;  Location: [facility];  Service: Ophthalmology;  Laterality: Left;    There were no vitals filed for this visit.    Subjective Assessment - 06/04/20 1606    Subjective Pt reports fall with R proximal humerus fx on 04/18/20. Pt had clear cervical xrays and head CT scan following fall. Pt was in sling until 05/21/2020. Pt had follow up with orthopedic doctor 05/31/2020; reports that MD stated fx was healing well. Pt reports no ROM restrictions that she is aware of. Pt reports having difficulty raising arm above head and out to the side.    Currently in Pain? Yes    Pain Score 3     Pain Location Shoulder    Pain Orientation Right    Pain Descriptors /  Indicators Aching;Dull    Pain Type Chronic pain    Pain Onset More than a month ago    Pain Frequency Intermittent    Aggravating Factors  raising overhead, lifting    Pain Relieving Factors rest, tylenol              [facility] PT Assessment - 06/04/20 0001      Assessment   Medical Diagnosis R humerus fx    Referring Provider (PT) Lysle Rubens    Onset Date/Surgical Date 04/18/20    Hand Dominance Right    Next MD Visit 07/02/2020      Precautions   Precautions None      Restrictions   Weight Bearing Restrictions No      Balance Screen   Has the patient fallen in the past 6 months Yes    How many times? 1    Has the patient had a decrease in activity level because of a fear of falling?  No    Is the patient reluctant to leave their home because of a fear of falling?  No      Home Environment   Additional Comments some housework  Prior Function   Level of Independence Independent    Vocation Retired    Leisure goes to [place] for the winter      ROM / Strength   AROM / PROM / Strength AROM;Strength;PROM      AROM   AROM Assessment Site Shoulder    Right/Left Shoulder Right;Left    Right Shoulder Flexion 60 Degrees    Right Shoulder ABduction 40 Degrees   with UT compensation   Left Shoulder Flexion 160 Degrees    Left Shoulder ABduction 135 Degrees    Left Shoulder Internal Rotation --   to C7   Left Shoulder External Rotation --   to T10     PROM   PROM Assessment Site Shoulder    Right/Left Shoulder Right    Right Shoulder Flexion 80 Degrees    Right Shoulder ABduction 65 Degrees      Strength   Strength Assessment Site Shoulder;Elbow    Right/Left Shoulder Left    Left Shoulder Flexion 5/5    Left Shoulder ABduction 5/5    Left Shoulder Internal Rotation 4+/5    Left Shoulder External Rotation 4+/5    Right/Left Elbow Right;Left    Right Elbow Flexion 4+/5    Right Elbow Extension 5/5    Left Elbow Flexion 5/5    Left Elbow Extension 5/5       Palpation   Palpation comment tender to palpation anterior R shoulder, R UT                      Objective measurements completed on examination: See above findings.       [facility] Adult PT Treatment/Exercise - 06/04/20 0001      Exercises   Exercises Shoulder      Shoulder Exercises: Isometric Strengthening   Flexion Limitations 1x10 3 sec    Extension Limitations 1x10 3 sec    ABduction Limitations 1x10 3 sec      Shoulder Exercises: Stretch   Table Stretch - Flexion 5 reps;10 seconds    Table Stretch - Abduction 5 reps;10 seconds                  PT Education - 06/04/20 1659    Education Details Pt educated on POC and HEP    Person(s) Educated Patient;Spouse    Methods Explanation;Demonstration;Handout    Comprehension Verbalized understanding;Returned demonstration            PT Short Term Goals - 06/04/20 1808      PT SHORT TERM GOAL #1   Title Pt will be I with initial HEP    Time 2    Period Weeks    Status New    Target Date 06/18/20             PT Long Term Goals - 06/04/20 1808      PT LONG TERM GOAL #1   Title Pt will demo R shoulder flexion to 160 deg    Time 8    Period Weeks    Status New    Target Date 07/30/20      PT LONG TERM GOAL #2   Title Pt will demo R shoulder abduction equivalent to LUE    Time 8    Period Weeks    Status New    Target Date 07/30/20      PT LONG TERM GOAL #3   Title Pt will report able to perform ADLs with R shoulder with </=  1/10 R shoulder pain    Time 8    Period Weeks    Status New    Target Date 07/30/20      PT LONG TERM GOAL #4   Title Pt will demo R shoulder MMT equivalent to L shoulder    Time 8    Period Weeks    Status New    Target Date 07/30/20                  Plan - 06/04/20 1701    Clinical Impression Statement Pt presents to clinic s/p R proximal humerus fx 04/18/20. Pt was in sling for ~1 month, removed 05/21/20. Pt reports no ROM restrictions (ROM to  tolerance) and no lifting >10 lbs. Pt demos signficant R shoulder ROM deficits all directions particularly R shoulder abduction. Pt demos elbow and cervical ROM WFL. Tenderness to palpation R UT, anterior shoulder, and R biceps tendon. Pt and spouse report they typically spend the winter months in [place]; potentially traveling there at beginning of February and will transfer to PT there. Pt would benefit from skilled PT to restore R shoulder ROM, strength, and return to PLOF.    Personal Factors and Comorbidities Comorbidity 3+    Comorbidities RA, HTN, DM    Examination-Activity Limitations Carry;Lift;Reach Overhead    Examination-Participation Restrictions Community Activity;Interpersonal Relationship;Cleaning;Laundry;Meal Prep    Stability/Clinical Decision Making Stable/Uncomplicated    Clinical Decision Making Low    Rehab Potential Good    PT Frequency 2x / week    PT Duration 8 weeks    PT Treatment/Interventions ADLs/Self Care Home Management;Electrical Stimulation;Iontophoresis 4mg /ml Dexamethasone;Moist Heat;Therapeutic activities;Therapeutic exercise;Patient/family education;Manual techniques;Passive range of motion    PT Next Visit Plan shoulder ROM to tolerance, slow progression of TE, manual/modalities as indicated    PT Home Exercise Plan seated table shoulder flexion/abduction, shoulder isometrics    Consulted and Agree with Plan of Care Patient           Patient will benefit from skilled therapeutic intervention in order to improve the following deficits and impairments:  Decreased range of motion,Increased muscle spasms,Impaired UE functional use,Pain,Hypomobility,Impaired flexibility,Decreased strength,Postural dysfunction  Visit Diagnosis: Acute pain of right shoulder  Stiffness of right shoulder, not elsewhere classified  Cramp and spasm  Localized edema     Problem List Patient Active Problem List   Diagnosis Date Noted  . Acute on chronic diastolic heart  failure (HCC)  . S/P TAVR (transcatheter aortic valve replacement) 03/03/2020  . Diabetes mellitus without complication (HCC)   . Pulmonary nodules   . Lesion of pancreas   . Severe aortic stenosis   . Pressure injury of skin 01/15/2020  . Iron deficiency anemia 01/06/2020  . Symptomatic anemia 10/23/2019  . Anemia 10/22/2019  . Polymyalgia rheumatica (HCC) 11/25/2016  . Primary osteoarthritis of both hands 11/25/2016  . Bilateral primary osteoarthritis of knee 11/25/2016  . Osteopenia of multiple sites 11/25/2016  . 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 Osteoarthritis of lumbar spine 10/28/2016  . History of gastroesophageal reflux (GERD) 10/27/2016  . Rheumatoid arthritis involving multiple sites with positive rheumatoid factor (HCC) 10/14/2016  . Coronary atherosclerosis of native coronary artery 01/30/2014  . Mixed hyperlipidemia 01/30/2014  . Essential hypertension, benign 01/30/2014  . Obesity, unspecified 01/30/2014   04/01/2014, PT, DPT Lysle Rubens [place] 06/04/2020, 6:15 PM  [facility] [address] [place], [place], [place] Phone: [phone]   Fax:  [phone]  Name: JAQUELA SHAKER MRN: [id number] Date of Birth: 1933-03-10

## 2020-06-10 ENCOUNTER — Ambulatory Visit: Payer: Medicare Other

## 2020-06-10 ENCOUNTER — Other Ambulatory Visit: Payer: Self-pay

## 2020-06-10 DIAGNOSIS — M25611 Stiffness of right shoulder, not elsewhere classified: Secondary | ICD-10-CM | POA: Diagnosis not present

## 2020-06-10 DIAGNOSIS — M25511 Pain in right shoulder: Secondary | ICD-10-CM

## 2020-06-10 DIAGNOSIS — R600 Localized edema: Secondary | ICD-10-CM

## 2020-06-10 DIAGNOSIS — R252 Cramp and spasm: Secondary | ICD-10-CM | POA: Diagnosis not present

## 2020-06-10 NOTE — Therapy (Signed)
[place] [place], [place], [address] Phone: [phone]   Fax:  [phone]  Physical Therapy Treatment  Patient Details  Name: Sophia Ward MRN: [id number] Date of Birth: 07-13-1932 Referring Provider (PT): Lysle Rubens   Encounter Date: 06/10/2020   PT End of Session - 06/10/20 1307    Visit Number 2    Date for PT Re-Evaluation 08/02/20    PT Start Time 4235    PT Stop Time 1100    PT Time Calculation (min) 45 min    Activity Tolerance Patient tolerated treatment well;Patient limited by pain    Behavior During Therapy [facility] for tasks assessed/performed           Past Medical History:  Diagnosis Date  . Allergic rhinitis   . Anemia 09/2019  . Arthritis    hands  . BPPV (benign paroxysmal positional vertigo)   . Coronary artery disease   . Diabetes mellitus without complication ([place])   . Essential hypertension, benign 01/30/2014  . GERD (gastroesophageal reflux disease)   . Hypertension   . Lesion of pancreas   . Mixed hyperlipidemia 01/30/2014  . Myocardial infarction ([place]) 2004   mild MI-no damage- had stents  . Osteopenia   . Post-menopausal   . Pulmonary nodules   . Rheumatoid arthritis ([place])   . S/P TAVR (transcatheter aortic valve replacement) 03/03/2020   s/p TAVR with a 23 mm Edwards S3U via the TF approach by Drs Burt Knack and Roxy Manns  . Severe aortic stenosis   . Spinal stenosis   . Stenosing tenosynovitis     Past Surgical History:  Procedure Laterality Date  . ABDOMINAL HYSTERECTOMY    . APPENDECTOMY    . BIOPSY  10/25/2019   Procedure: BIOPSY;  Surgeon: Otis Brace, MD;  Location: [place];  Service: Gastroenterology;;  . BIOPSY  10/26/2019   Procedure: BIOPSY;  Surgeon: Otis Brace, MD;  Location: [place] [facility];  Service: Gastroenterology;;  . CARDIAC CATHETERIZATION  2004  . carpel tunnel    . COLONOSCOPY WITH PROPOFOL N/A 10/30/2012   Procedure: COLONOSCOPY WITH  PROPOFOL;  Surgeon: Garlan Fair, MD;  [facility];  Service: Endoscopy;  Laterality: N/A;  . COLONOSCOPY WITH PROPOFOL N/A 10/26/2019   Procedure: COLONOSCOPY WITH PROPOFOL;  Surgeon: Otis Brace, MD;  Location: [place];  Service: Gastroenterology;  Laterality: N/A;  . CORONARY ANGIOPLASTY  2004  . CORONARY ATHERECTOMY N/A 02/21/2020   Procedure: CORONARY ATHERECTOMY;  Surgeon: Jettie Booze, MD;  Location: [place] CV LAB;  Service: Cardiovascular;  Laterality: N/A;  . CORONARY STENT INTERVENTION N/A 02/21/2020   Procedure: CORONARY STENT INTERVENTION;  Surgeon: Jettie Booze, MD;  Location: [place] CV LAB;  Service: Cardiovascular;  Laterality: N/A;  . DILATION AND CURETTAGE OF UTERUS    . ESOPHAGOGASTRODUODENOSCOPY N/A 10/25/2019   Procedure: ESOPHAGOGASTRODUODENOSCOPY (EGD);  Surgeon: Otis Brace, MD;  Location: [facility] ENDOSCOPY;  Service: Gastroenterology;  Laterality: N/A;  . ESOPHAGOGASTRODUODENOSCOPY (EGD) WITH PROPOFOL N/A 10/30/2012   Procedure: ESOPHAGOGASTRODUODENOSCOPY (EGD) WITH PROPOFOL;  Surgeon: Garlan Fair, MD;  [facility];  Service: Endoscopy;  Laterality: N/A;  . EYE SURGERY     bilateral cataract with lens implant  . EYE SURGERY    . INJECTION OF SILICONE OIL Left 3/61/4431   Procedure: Injection Of Silicone Oil;  Surgeon: Jalene Mullet, MD;  Location: [place];  Service: Ophthalmology;  Laterality: Left;  . INTRAVASCULAR PRESSURE WIRE/FFR STUDY N/A 01/29/2020   Procedure: INTRAVASCULAR  PRESSURE WIRE/FFR STUDY;  Surgeon: Jettie Booze, MD;  Location: [place] CV LAB;  Service: Cardiovascular;  Laterality: N/A;  . INTRAVASCULAR ULTRASOUND/IVUS N/A 02/21/2020   Procedure: Intravascular Ultrasound/IVUS;  Surgeon: Jettie Booze, MD;  Location: [place] CV LAB;  Service: Cardiovascular;  Laterality: N/A;  . left shouler surgery    . PHOTOCOAGULATION WITH LASER Left 02/12/2019   Procedure:  Photocoagulation With Laser;  Surgeon: Jalene Mullet, MD;  Location: [place];  Service: Ophthalmology;  Laterality: Left;  . 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 02/12/2019   Procedure: REPAIR OF COMPLEX TRACTION RETINAL DETACHMENT;  Surgeon: Jalene Mullet, MD;  Location: [place];  Service: Ophthalmology;  Laterality: Left;  . RIGHT/LEFT HEART CATH AND CORONARY ANGIOGRAPHY N/A 01/29/2020   Procedure: RIGHT/LEFT HEART CATH AND CORONARY ANGIOGRAPHY;  Surgeon: Jettie Booze, MD;  Location: [place] CV LAB;  Service: Cardiovascular;  Laterality: N/A;  . TEE WITHOUT CARDIOVERSION N/A 03/03/2020   Procedure: TRANSESOPHAGEAL ECHOCARDIOGRAM (TEE);  Surgeon: Sherren Mocha, MD;  Location: [place];  Service: Open Heart Surgery;  Laterality: N/A;  . TONSILLECTOMY    . TRANSCATHETER AORTIC VALVE REPLACEMENT, TRANSFEMORAL  03/03/2020  . TRANSCATHETER AORTIC VALVE REPLACEMENT, TRANSFEMORAL N/A 03/03/2020   Procedure: TRANSCATHETER AORTIC VALVE REPLACEMENT, TRANSFEMORAL USING EDWARDS SAPIEN 3 23 MM AORTIC VALVE.;  Surgeon: Sherren Mocha, MD;  Location: [place];  Service: Open Heart Surgery;  Laterality: N/A;  . VITRECTOMY 25 GAUGE WITH SCLERAL BUCKLE Left 02/12/2019   Procedure: Vitrectomy 25 Gauge With Scleral Buckle;  Surgeon: Jalene Mullet, MD;  Location: [place];  Service: Ophthalmology;  Laterality: Left;    There were no vitals filed for this visit.   Subjective Assessment - 06/10/20 1027    Subjective Sophia Ward reports she continues to take tylenol as needed , uses hot pack at home if she has pain. She has been doing her home exercises. She woke today with some back/hip pain but is otherwise doing okay. She states she is looking forward to going back to [place] but had questions about how long she will need PT for.    Pertinent History RIGHT proximal humerus fx 04/18/20    Currently in Pain? Yes    Pain Score 2     Pain Location Shoulder    Pain Orientation Right    Pain Descriptors  / Indicators Aching;Dull    Pain Type Chronic pain    Pain Onset More than a month ago    Pain Frequency Intermittent    Aggravating Factors  raising overhead, lifting    Pain Relieving Factors rst, tylenol, hot pack (states she isleaving it on 30-40 minutes at a time)                             [facility] Adult PT Treatment/Exercise - 06/10/20 0001      Shoulder Exercises: Seated   Retraction Limitations Seated scap retractions  tactile & verbal cues provided 1 x 2 with 3" holds    Flexion Limitations seated P/AAROM shoulder flexion at table with cues for trunk movement to assist, x 10    ABduction Limitations seated P/AAROM right shoulder abduction at table with cues for trunk movement x10    Other Seated Exercises Scapular clocks (elevation, retraction, depression focus).      Shoulder Exercises: Isometric Strengthening   Flexion Limitations 1x10 3 sec    Extension Limitations 1x10 3 sec    ABduction Limitations 1x10 3 sec  Modalities   Modalities Moist Heat      Moist Heat Therapy   Number Minutes Moist Heat 10 Minutes    Moist Heat Location Shoulder   right     Manual Therapy   Manual Therapy Soft tissue mobilization   PROM   Manual therapy comments Seated PROM shoulder flexion and abduction x 5 minutes    Soft tissue mobilization Right upper trap, periscapular muscles                  PT Education - 06/10/20 1304    Education Details Pt educated on modifications to assist with improving tolerance to shoulder ROM HEP at home. Discussed addition of intermittent scapular retractions, c/s ROM as well. Educated on safe practices for applying heat at home ensuring heat pack is covered and there arelayers between heat pack and skin, and limiting  to 15-20 minutes at a time during the day. Also educated on rationale/benefit of continuing PT once returning to [place] to continue rehab.   Person(s) Educated Patient    Methods  Explanation;Demonstration;Tactile cues;Verbal cues    Comprehension Verbalized understanding;Returned demonstration            PT Short Term Goals - 06/04/20 1808      PT SHORT TERM GOAL #1   Title Pt will be I with initial HEP    Time 2    Period Weeks    Status New    Target Date 06/18/20             PT Long Term Goals - 06/04/20 1808      PT LONG TERM GOAL #1   Title Pt will demo R shoulder flexion to 160 deg    Time 8    Period Weeks    Status New    Target Date 07/30/20      PT LONG TERM GOAL #2   Title Pt will demo R shoulder abduction equivalent to LUE    Time 8    Period Weeks    Status New    Target Date 07/30/20      PT LONG TERM GOAL #3   Title Pt will report able to perform ADLs with R shoulder with </= 1/10 R shoulder pain    Time 8    Period Weeks    Status New    Target Date 07/30/20      PT LONG TERM GOAL #4   Title Pt will demo R shoulder MMT equivalent to L shoulder    Time 8    Period Weeks    Status New    Target Date 07/30/20                 Plan - 06/10/20 1307    Clinical Impression Statement Pt tolerated first treatment session well. She was able to return demo HEP performance well but required education for position modifications and use of trunk movements to assist in getting more ROM. She had some difficulty with coordination, planning and execution of scapular mobiltiy exercises but responded nicely to verbal and tactile cues for success. Noted decreased endurance in periscapular muscles. Continue per POC.    Personal Factors and Comorbidities Comorbidity 3+    Comorbidities RA, HTN, DM    Examination-Activity Limitations Carry;Lift;Reach Overhead    Examination-Participation Restrictions Community Activity;Interpersonal Relationship;Cleaning;Laundry;Meal Prep    Rehab Potential Good    PT Frequency 2x / week    PT Duration 8 weeks    PT Treatment/Interventions ADLs/Self  Care Home Management;Electrical  Stimulation;Iontophoresis 4mg /ml Dexamethasone;Moist Heat;Therapeutic activities;Therapeutic exercise;Patient/family education;Manual techniques;Passive range of motion    PT Next Visit Plan shoulder ROM to tolerance, slow progression of TE, manual/modalities as indicated    PT Home Exercise Plan seated table shoulder flexion/abduction, shoulder isometrics. Scap retractions.    Consulted and Agree with Plan of Care Patient           Patient will benefit from skilled therapeutic intervention in order to improve the following deficits and impairments:  Decreased range of motion,Increased muscle spasms,Impaired UE functional use,Pain,Hypomobility,Impaired flexibility,Decreased strength,Postural dysfunction  Visit Diagnosis: Acute pain of right shoulder  Stiffness of right shoulder, not elsewhere classified  Cramp and spasm  Localized edema     Problem List Patient Active Problem List   Diagnosis Date Noted  . Acute on chronic diastolic heart failure ([place]) 03/04/2020  . S/P TAVR (transcatheter aortic valve replacement) 03/03/2020  . Diabetes mellitus without complication ([place])   . Pulmonary nodules   . Lesion of pancreas   . Severe aortic stenosis   . Pressure injury of skin 01/15/2020  . Iron deficiency anemia 01/06/2020  . Symptomatic anemia 10/23/2019  . Anemia 10/22/2019  . Polymyalgia rheumatica ([place]) 11/25/2016  . Primary osteoarthritis of both hands 11/25/2016  . Bilateral primary osteoarthritis of knee 11/25/2016  . Osteopenia of multiple sites 11/25/2016  . 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 Osteoarthritis of lumbar spine 10/28/2016  . History of gastroesophageal reflux (GERD) 10/27/2016  . Rheumatoid arthritis involving multiple sites with positive rheumatoid factor ([place]) 10/14/2016  . Coronary atherosclerosis of native coronary artery 01/30/2014  . Mixed hyperlipidemia 01/30/2014  . Essential hypertension, benign 01/30/2014  . Obesity,  unspecified 01/30/2014    Hall Busing, PT, DPT 06/10/2020, 1:14 PM  [place]. [place], [place], [address] Phone: [phone]   Fax:  [phone]  Name: Sophia Ward MRN: [id number] Date of Birth: 10/20/1932

## 2020-06-12 ENCOUNTER — Other Ambulatory Visit: Payer: Self-pay

## 2020-06-12 ENCOUNTER — Ambulatory Visit: Payer: Medicare Other

## 2020-06-12 DIAGNOSIS — R600 Localized edema: Secondary | ICD-10-CM | POA: Diagnosis not present

## 2020-06-12 DIAGNOSIS — M25611 Stiffness of right shoulder, not elsewhere classified: Secondary | ICD-10-CM | POA: Diagnosis not present

## 2020-06-12 DIAGNOSIS — M25511 Pain in right shoulder: Secondary | ICD-10-CM

## 2020-06-12 DIAGNOSIS — R252 Cramp and spasm: Secondary | ICD-10-CM

## 2020-06-12 NOTE — Therapy (Signed)
[place]. [place], [place], [address] Phone: [phone]   Fax:  [phone]  Physical Therapy Treatment  Patient Details  Name: Sophia Ward MRN: [id number] Date of Birth: 10/28/1932 Referring Provider (PT): Lysle Rubens   Encounter Date: 06/12/2020   PT End of Session - 06/12/20 1146    Visit Number 3    Date for PT Re-Evaluation 08/02/20    PT Start Time 1100    PT Stop Time 1145    PT Time Calculation (min) 45 min    Activity Tolerance Patient tolerated treatment well;Patient limited by pain    Behavior During Therapy [facility] for tasks assessed/performed           Past Medical History:  Diagnosis Date  . Allergic rhinitis   . Anemia 09/2019  . Arthritis    hands  . BPPV (benign paroxysmal positional vertigo)   . Coronary artery disease   . Diabetes mellitus without complication ([place])   . Essential hypertension, benign 01/30/2014  . GERD (gastroesophageal reflux disease)   . Hypertension   . Lesion of pancreas   . Mixed hyperlipidemia 01/30/2014  . Myocardial infarction ([place]) 2004   mild MI-no damage- had stents  . Osteopenia   . Post-menopausal   . Pulmonary nodules   . Rheumatoid arthritis ([place])   . S/P TAVR (transcatheter aortic valve replacement) 03/03/2020   s/p TAVR with a 23 mm Edwards S3U via the TF approach by Drs Burt Knack and Roxy Manns  . Severe aortic stenosis   . Spinal stenosis   . Stenosing tenosynovitis     Past Surgical History:  Procedure Laterality Date  . ABDOMINAL HYSTERECTOMY    . APPENDECTOMY    . BIOPSY  10/25/2019   Procedure: BIOPSY;  Surgeon: Otis Brace, MD;  Location: [place];  Service: Gastroenterology;;  . BIOPSY  10/26/2019   Procedure: BIOPSY;  Surgeon: Otis Brace, MD;  Location: [place] [facility]OSCOPY;  Service: Gastroenterology;;  . CARDIAC CATHETERIZATION  2004  . carpel tunnel    . COLONOSCOPY WITH PROPOFOL N/A 10/30/2012   Procedure: COLONOSCOPY WITH  PROPOFOL;  Surgeon: Garlan Fair, MD;  [facility];  Service: Endoscopy;  Laterality: N/A;  . COLONOSCOPY WITH PROPOFOL N/A 10/26/2019   Procedure: COLONOSCOPY WITH PROPOFOL;  Surgeon: Otis Brace, MD;  Location: [place];  Service: Gastroenterology;  Laterality: N/A;  . CORONARY ANGIOPLASTY  2004  . CORONARY ATHERECTOMY N/A 02/21/2020   Procedure: CORONARY ATHERECTOMY;  Surgeon: Jettie Booze, MD;  Location: [place] CV LAB;  Service: Cardiovascular;  Laterality: N/A;  . CORONARY STENT INTERVENTION N/A 02/21/2020   Procedure: CORONARY STENT INTERVENTION;  Surgeon: Jettie Booze, MD;  Location: [place] CV LAB;  Service: Cardiovascular;  Laterality: N/A;  . DILATION AND CURETTAGE OF UTERUS    . ESOPHAGOGASTRODUODENOSCOPY N/A 10/25/2019   Procedure: ESOPHAGOGASTRODUODENOSCOPY (EGD);  Surgeon: Otis Brace, MD;  Location: [facility] ENDOSCOPY;  Service: Gastroenterology;  Laterality: N/A;  . ESOPHAGOGASTRODUODENOSCOPY (EGD) WITH PROPOFOL N/A 10/30/2012   Procedure: ESOPHAGOGASTRODUODENOSCOPY (EGD) WITH PROPOFOL;  Surgeon: Garlan Fair, MD;  [facility];  Service: Endoscopy;  Laterality: N/A;  . EYE SURGERY     bilateral cataract with lens implant  . EYE SURGERY    . INJECTION OF SILICONE OIL Left 0/38/8828   Procedure: Injection Of Silicone Oil;  Surgeon: Jalene Mullet, MD;  Location: [place];  Service: Ophthalmology;  Laterality: Left;  . INTRAVASCULAR PRESSURE WIRE/FFR STUDY N/A 01/29/2020   Procedure: INTRAVASCULAR  PRESSURE WIRE/FFR STUDY;  Surgeon: Corky Crafts, MD;  Location: [facility] INVASIVE CV LAB;  Service: Cardiovascular;  Laterality: N/A;  . INTRAVASCULAR ULTRASOUND/IVUS N/A 02/21/2020   Procedure: Intravascular Ultrasound/IVUS;  Surgeon: Corky Crafts, MD;  Location: [facility] INVASIVE CV LAB;  Service: Cardiovascular;  Laterality: N/A;  . left shouler surgery    . PHOTOCOAGULATION WITH LASER Left 02/12/2019   Procedure:  Photocoagulation With Laser;  Surgeon: Carmela Rima, MD;  Location: [facility] OR;  Service: Ophthalmology;  Laterality: Left;  . 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 02/12/2019   Procedure: REPAIR OF COMPLEX TRACTION RETINAL DETACHMENT;  Surgeon: Carmela Rima, MD;  Location: [facility] OR;  Service: Ophthalmology;  Laterality: Left;  . RIGHT/LEFT HEART CATH AND CORONARY ANGIOGRAPHY N/A 01/29/2020   Procedure: RIGHT/LEFT HEART CATH AND CORONARY ANGIOGRAPHY;  Surgeon: Corky Crafts, MD;  Location: [facility] INVASIVE CV LAB;  Service: Cardiovascular;  Laterality: N/A;  . TEE WITHOUT CARDIOVERSION N/A 03/03/2020   Procedure: TRANSESOPHAGEAL ECHOCARDIOGRAM (TEE);  Surgeon: Tonny Bollman, MD;  Location: [facility] OR;  Service: Open Heart Surgery;  Laterality: N/A;  . TONSILLECTOMY    . TRANSCATHETER AORTIC VALVE REPLACEMENT, TRANSFEMORAL  03/03/2020  . TRANSCATHETER AORTIC VALVE REPLACEMENT, TRANSFEMORAL N/A 03/03/2020   Procedure: TRANSCATHETER AORTIC VALVE REPLACEMENT, TRANSFEMORAL USING EDWARDS SAPIEN 3 23 MM AORTIC VALVE.;  Surgeon: Tonny Bollman, MD;  Location: [facility] OR;  Service: Open Heart Surgery;  Laterality: N/A;  . VITRECTOMY 25 GAUGE WITH SCLERAL BUCKLE Left 02/12/2019   Procedure: Vitrectomy 25 Gauge With Scleral Buckle;  Surgeon: Carmela Rima, MD;  Location: [facility] OR;  Service: Ophthalmology;  Laterality: Left;    There were no vitals filed for this visit.   Subjective Assessment - 06/12/20 1105    Subjective feeling pretty good today, no pain reported    Pertinent History RIGHT proximal humerus fx 04/18/20    Currently in Pain? No/denies                   [facility] Adult PT Treatment/Exercise - 06/12/20 0001      Exercises   Exercises Shoulder      Shoulder Exercises: Seated   Retraction Limitations Seated scap retractions  tactile & verbal cues provided 10 x 2 with 3" holds    Other Seated Exercises Scapular clocks (elevation, retraction, depression focus); Cane flexion AAROM in  available/pain free range) x 10; cane ER within painfree range x10      Shoulder Exercises: Pulleys   Flexion 2 minutes      Shoulder Exercises: ROM/Strengthening   Nustep 5 min LE only   general cardiovascular endurance   [place] 10 reps fwd flexion to 90deg available range, AA with elbow supported      Shoulder Exercises: Isometric Strengthening   Flexion Limitations 1x10 3 sec    Extension Limitations 1x10 3 sec    ABduction Limitations 1x10 3 sec      Shoulder Exercises: Stretch   Other Shoulder Stretches modified standing ER doorway stretch to 30 deg available      Manual Therapy   Manual therapy comments Seated PROM shoulder flexion, abduction, scaption    Soft tissue mobilization Right upper trap, periscapular muscles             PT Short Term Goals - 06/04/20 1808      PT SHORT TERM GOAL #1   Title Pt will be I with initial HEP    Time 2    Period Weeks    Status New  Target Date 06/18/20             PT Long Term Goals - 06/04/20 1808      PT LONG TERM GOAL #1   Title Pt will demo R shoulder flexion to 160 deg    Time 8    Period Weeks    Status New    Target Date 07/30/20      PT LONG TERM GOAL #2   Title Pt will demo R shoulder abduction equivalent to LUE    Time 8    Period Weeks    Status New    Target Date 07/30/20      PT LONG TERM GOAL #3   Title Pt will report able to perform ADLs with R shoulder with </= 1/10 R shoulder pain    Time 8    Period Weeks    Status New    Target Date 07/30/20      PT LONG TERM GOAL #4   Title Pt will demo R shoulder MMT equivalent to L shoulder    Time 8    Period Weeks    Status New    Target Date 07/30/20                 Plan - 06/12/20 1147    Clinical Impression Statement Pt tolerated treatment nicely today. Continues to do nicely wiht shoulder isometrics, good carryover of HEP for table flexion and abd. Incorporated some gentle ER ROM today with good tolerance wihtin painfree range. At  end of today's session, she tolerated PROM for flexion/abduction/scaption much better to about 110 degs inctead of initial 85 deg at start of session. Continue per POC.    Comorbidities RA, HTN, DM    Examination-Activity Limitations Carry;Lift;Reach Overhead    Examination-Participation Restrictions Community Activity;Interpersonal Relationship;Cleaning;Laundry;Meal Prep    Rehab Potential Good    PT Frequency 2x / week    PT Duration 8 weeks    PT Treatment/Interventions ADLs/Self Care Home Management;Electrical Stimulation;Iontophoresis 4mg /ml Dexamethasone;Moist Heat;Therapeutic activities;Therapeutic exercise;Patient/family education;Manual techniques;Passive range of motion    PT Next Visit Plan shoulder ROM to tolerance, slow progression of TE, manual/modalities as indicated. Plan to update HEP next week if appropriate.    Consulted and Agree with Plan of Care Patient           Patient will benefit from skilled therapeutic intervention in order to improve the following deficits and impairments:  Decreased range of motion,Increased muscle spasms,Impaired UE functional use,Pain,Hypomobility,Impaired flexibility,Decreased strength,Postural dysfunction  Visit Diagnosis: Acute pain of right shoulder  Stiffness of right shoulder, not elsewhere classified  Cramp and spasm  Localized edema     Problem List Patient Active Problem List   Diagnosis Date Noted  . Acute on chronic diastolic heart failure ([place]) 03/04/2020  . S/P TAVR (transcatheter aortic valve replacement) 03/03/2020  . Diabetes mellitus without complication ([place])   . Pulmonary nodules   . Lesion of pancreas   . Severe aortic stenosis   . Pressure injury of skin 01/15/2020  . Iron deficiency anemia 01/06/2020  . Symptomatic anemia 10/23/2019  . Anemia 10/22/2019  . Polymyalgia rheumatica ([place]) 11/25/2016  . Primary osteoarthritis of both hands 11/25/2016  . Bilateral primary osteoarthritis of knee 11/25/2016  .  Osteopenia of multiple sites 11/25/2016  . 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 Osteoarthritis of lumbar spine 10/28/2016  . History of gastroesophageal reflux (GERD) 10/27/2016  . Rheumatoid arthritis involving multiple sites with positive rheumatoid factor ([place])  10/14/2016  . Coronary atherosclerosis of native coronary artery 01/30/2014  . Mixed hyperlipidemia 01/30/2014  . Essential hypertension, benign 01/30/2014  . Obesity, unspecified 01/30/2014    Hall Busing, PT, DPT 06/12/2020, 11:53 AM  [place]. [place], [place], [address] Phone: [phone]   Fax:  [phone]  Name: MARKEVIA SCHAPIRO MRN: [id number] Date of Birth: 1932-11-29

## 2020-06-16 ENCOUNTER — Other Ambulatory Visit: Payer: Self-pay

## 2020-06-16 ENCOUNTER — Ambulatory Visit: Payer: Medicare Other

## 2020-06-16 ENCOUNTER — Other Ambulatory Visit: Payer: Self-pay | Admitting: Rheumatology

## 2020-06-16 DIAGNOSIS — M25611 Stiffness of right shoulder, not elsewhere classified: Secondary | ICD-10-CM | POA: Diagnosis not present

## 2020-06-16 DIAGNOSIS — R600 Localized edema: Secondary | ICD-10-CM | POA: Diagnosis not present

## 2020-06-16 DIAGNOSIS — M25511 Pain in right shoulder: Secondary | ICD-10-CM

## 2020-06-16 DIAGNOSIS — R252 Cramp and spasm: Secondary | ICD-10-CM

## 2020-06-16 NOTE — Patient Instructions (Signed)
Pt instructed to follow up with MD regarding elbow wound, showing s/sx of possible infection (redness, pus, itchiness).

## 2020-06-16 NOTE — Therapy (Signed)
[facility]. [place], [place], [address] Phone: [phone]   Fax:  [phone]  Physical Therapy Treatment  Patient Details  Name: Sophia Ward MRN: [id number] Date of Birth: [birth year] Referring Provider (PT): Lysle Rubens   Encounter Date: 06/16/2020   PT End of Session - 06/16/20 1114    Visit Number 4    Date for PT Re-Evaluation 08/02/20    PT Start Time 8841    PT Stop Time 1100    PT Time Calculation (min) 45 min    Activity Tolerance Patient tolerated treatment well;Patient limited by pain    Behavior During Therapy [facility] for tasks assessed/performed           Past Medical History:  Diagnosis Date  . Allergic rhinitis   . Anemia 09/2019  . Arthritis    hands  . BPPV (benign paroxysmal positional vertigo)   . Coronary artery disease   . Diabetes mellitus without complication ([place])   . Essential hypertension, benign 01/30/2014  . GERD (gastroesophageal reflux disease)   . Hypertension   . Lesion of pancreas   . Mixed hyperlipidemia 01/30/2014  . Myocardial infarction ([place]) 2004   mild MI-no damage- had stents  . Osteopenia   . Post-menopausal   . Pulmonary nodules   . Rheumatoid arthritis ([place])   . S/P TAVR (transcatheter aortic valve replacement) 03/03/2020   s/p TAVR with a 23 mm Edwards S3U via the TF approach by Drs Burt Knack and Roxy Manns  . Severe aortic stenosis   . Spinal stenosis   . Stenosing tenosynovitis     Past Surgical History:  Procedure Laterality Date  . ABDOMINAL HYSTERECTOMY    . APPENDECTOMY    . BIOPSY  10/25/2019   Procedure: BIOPSY;  Surgeon: Otis Brace, MD;  Location: [facility];  Service: Gastroenterology;;  . BIOPSY  10/26/2019   Procedure: BIOPSY;  Surgeon: Otis Brace, MD;  Location: [facility];  Service: Gastroenterology;;  . CARDIAC CATHETERIZATION  2004  . carpel tunnel    . COLONOSCOPY WITH PROPOFOL N/A 10/30/2012   Procedure: COLONOSCOPY WITH  PROPOFOL;  Surgeon: Garlan Fair, MD;  [facility];  Service: Endoscopy;  Laterality: N/A;  . COLONOSCOPY WITH PROPOFOL N/A 10/26/2019   Procedure: COLONOSCOPY WITH PROPOFOL;  Surgeon: Otis Brace, MD;  Location: [facility];  Service: Gastroenterology;  Laterality: N/A;  . CORONARY ANGIOPLASTY  2004  . CORONARY ATHERECTOMY N/A 02/21/2020   Procedure: CORONARY ATHERECTOMY;  Surgeon: Jettie Booze, MD;  Location: [facility];  Service: Cardiovascular;  Laterality: N/A;  . CORONARY STENT INTERVENTION N/A 02/21/2020   Procedure: CORONARY STENT INTERVENTION;  Surgeon: Jettie Booze, MD;  Location: [facility];  Service: Cardiovascular;  Laterality: N/A;  . DILATION AND CURETTAGE OF UTERUS    . ESOPHAGOGASTRODUODENOSCOPY N/A 10/25/2019   Procedure: ESOPHAGOGASTRODUODENOSCOPY (EGD);  Surgeon: Otis Brace, MD;  Location: [facility];  Service: Gastroenterology;  Laterality: N/A;  . ESOPHAGOGASTRODUODENOSCOPY (EGD) WITH PROPOFOL N/A 10/30/2012   Procedure: ESOPHAGOGASTRODUODENOSCOPY (EGD) WITH PROPOFOL;  Surgeon: Garlan Fair, MD;  [facility];  Service: Endoscopy;  Laterality: N/A;  . EYE SURGERY     bilateral cataract with lens implant  . EYE SURGERY    . INJECTION OF SILICONE OIL Left 6/60/6301   Procedure: Injection Of Silicone Oil;  Surgeon: Jalene Mullet, MD;  Location: [facility];  Service: Ophthalmology;  Laterality: Left;  . INTRAVASCULAR PRESSURE WIRE/FFR STUDY N/A 01/29/2020   Procedure: INTRAVASCULAR  PRESSURE WIRE/FFR STUDY;  Surgeon: Jettie Booze, MD;  Location: [facility];  Service: Cardiovascular;  Laterality: N/A;  . INTRAVASCULAR ULTRASOUND/IVUS N/A 02/21/2020   Procedure: Intravascular Ultrasound/IVUS;  Surgeon: Jettie Booze, MD;  Location: [facility];  Service: Cardiovascular;  Laterality: N/A;  . left shouler surgery    . PHOTOCOAGULATION WITH LASER Left 02/12/2019   Procedure:  Photocoagulation With Laser;  Surgeon: Jalene Mullet, MD;  Location: [facility];  Service: Ophthalmology;  Laterality: Left;  . 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 02/12/2019   Procedure: REPAIR OF COMPLEX TRACTION RETINAL DETACHMENT;  Surgeon: Jalene Mullet, MD;  Location: [facility];  Service: Ophthalmology;  Laterality: Left;  . RIGHT/LEFT HEART CATH AND CORONARY ANGIOGRAPHY N/A 01/29/2020   Procedure: RIGHT/LEFT HEART CATH AND CORONARY ANGIOGRAPHY;  Surgeon: Jettie Booze, MD;  Location: [facility] CV LAB;  Service: Cardiovascular;  Laterality: N/A;  . TEE WITHOUT CARDIOVERSION N/A 03/03/2020   Procedure: TRANSESOPHAGEAL ECHOCARDIOGRAM (TEE);  Surgeon: Sherren Mocha, MD;  Location: [facility];  Service: Open Heart Surgery;  Laterality: N/A;  . TONSILLECTOMY    . TRANSCATHETER AORTIC VALVE REPLACEMENT, TRANSFEMORAL  03/03/2020  . TRANSCATHETER AORTIC VALVE REPLACEMENT, TRANSFEMORAL N/A 03/03/2020   Procedure: TRANSCATHETER AORTIC VALVE REPLACEMENT, TRANSFEMORAL USING EDWARDS SAPIEN 3 23 MM AORTIC VALVE.;  Surgeon: Sherren Mocha, MD;  Location: [facility];  Service: Open Heart Surgery;  Laterality: N/A;  . VITRECTOMY 25 GAUGE WITH SCLERAL BUCKLE Left 02/12/2019   Procedure: Vitrectomy 25 Gauge With Scleral Buckle;  Surgeon: Jalene Mullet, MD;  Location: [facility];  Service: Ophthalmology;  Laterality: Left;    There were no vitals filed for this visit.   Subjective Assessment - 06/16/20 1019    Subjective Has follow up with surgeon on the 2nd or 3rd of february. Pt reports elbow wound seems to have busted open (had it  covered in a large bandaid).    Pertinent History RIGHT proximal humerus fx 04/18/20    Currently in Pain? No/denies             [facility] Adult PT Treatment/Exercise - 06/16/20 0001      Shoulder Exercises: Seated   Retraction Limitations Seated scap retractions  tactile & verbal cues provided 10 x 2 with 3" holds    Flexion Limitations seated P/AAROM shoulder flexion  at table with cues for trunk movement to assist, x 10    ABduction Limitations seated P/AAROM right shoulder abduction at table with cues for trunk movement x10    Other Seated Exercises Scapular clocks (elevation, retraction, depression focus); Cane flexion AAROM in available/pain free range) x 10; cane ER within painfree range x10      Shoulder Exercises: Pulleys   Flexion 2 minutes    Scaption Limitations 2 minutes      Shoulder Exercises: ROM/Strengthening   Nustep 6 min LE only    Ranger 10 reps x 2 fwd flexion to 90deg available range, AA with elbow supported as needed. 10x2 horizontal abd to 60 deg      Shoulder Exercises: Isometric Strengthening   Flexion Limitations x15 3 sec    Extension Limitations x15 3 sec      Shoulder Exercises: Stretch   Other Shoulder Stretches modified standing ER doorway stretch to 30 deg available      Manual Therapy   Manual therapy comments Seated PROM and AA/MWM shoulder flexion, abduction, scaption.    Soft tissue mobilization Right upper trap, periscapular muscles  PT Short Term Goals - 06/04/20 1808      PT SHORT TERM GOAL #1   Title Pt will be I with initial HEP    Time 2    Period Weeks    Status New    Target Date 06/18/20             PT Long Term Goals - 06/04/20 1808      PT LONG TERM GOAL #1   Title Pt will demo R shoulder flexion to 160 deg    Time 8    Period Weeks    Status New    Target Date 07/30/20      PT LONG TERM GOAL #2   Title Pt will demo R shoulder abduction equivalent to LUE    Time 8    Period Weeks    Status New    Target Date 07/30/20      PT LONG TERM GOAL #3   Title Pt will report able to perform ADLs with R shoulder with </= 1/10 R shoulder pain    Time 8    Period Weeks    Status New    Target Date 07/30/20      PT LONG TERM GOAL #4   Title Pt will demo R shoulder MMT equivalent to L shoulder    Time 8    Period Weeks    Status New    Target Date 07/30/20                  Plan - 06/16/20 1115    Clinical Impression Statement Pt presented with open elbow wound today (covered by large bandaid). Wound and surrounding tissue was red, was exuding yellow-brown colored pus and pt reports it was slightly itchy. Patient was advised to contact MD regarding these s/sx of possible infection.Wound was covered by large bandaid for entirety of session. Otherwise all exercises tolerat dnicely today, gradual progression of repetions and range for shoudler ROM exercises. ROM limited to approximately 105 deg of shoulder abduction today (P or AAROM) with pt reports of some muscle pain exacerbation into deltoid area only at that range. She continues to compensate using upper trap elevation with attempts to lift arm above 85 to 95 degrees on her own. She will continue to benefit from skilled PT to regain ROM, improve strength, and improve functional mobility.    Rehab Potential Good    PT Frequency 2x / week    PT Duration 8 weeks    PT Treatment/Interventions ADLs/Self Care Home Management;Electrical Stimulation;Iontophoresis 4mg /ml Dexamethasone;Moist Heat;Therapeutic activities;Therapeutic exercise;Patient/family education;Manual techniques;Passive range of motion    PT Next Visit Plan shoulder ROM to tolerance, slow progression of TE, manual/modalities as indicated. UPDATE GOALS & HEP next visit.   PT Home Exercise Plan seated table shoulder flexion/abduction, shoulder isometrics (hold ABD to prevent aggravation of elbow wound). Scap retractions.    Consulted and Agree with Plan of Care Patient           Patient will benefit from skilled therapeutic intervention in order to improve the following deficits and impairments:     Visit Diagnosis: Acute pain of right shoulder  Stiffness of right shoulder, not elsewhere classified  Cramp and spasm  Localized edema     Problem List Patient Active Problem List   Diagnosis Date Noted  . Acute on chronic diastolic  heart failure ([place]) 03/04/2020  . S/P TAVR (transcatheter aortic valve replacement) 03/03/2020  . Diabetes mellitus without complication ([place])   .  Pulmonary nodules   . Lesion of pancreas   . Severe aortic stenosis   . Pressure injury of skin 01/15/2020  . Iron deficiency anemia 01/06/2020  . Symptomatic anemia 10/23/2019  . Anemia 10/22/2019  . Polymyalgia rheumatica ([place]) 11/25/2016  . Primary osteoarthritis of both hands 11/25/2016  . Bilateral primary osteoarthritis of knee 11/25/2016  . Osteopenia of multiple sites 11/25/2016  . 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 Osteoarthritis of lumbar spine 10/28/2016  . History of gastroesophageal reflux (GERD) 10/27/2016  . Rheumatoid arthritis involving multiple sites with positive rheumatoid factor ([place]) 10/14/2016  . Coronary atherosclerosis of native coronary artery 01/30/2014  . Mixed hyperlipidemia 01/30/2014  . Essential hypertension, benign 01/30/2014  . Obesity, unspecified 01/30/2014    Hall Busing, PT, DPT 06/16/2020, 11:25 AM  [facility]. [place], [place], [address] Phone: [phone]   Fax:  [phone]  Name: DEANI MELBOURNE MRN: [id number] Date of Birth: 08/26/1932

## 2020-06-16 NOTE — Telephone Encounter (Signed)
Last Visit: 05/06/2020 Next Visit: 10/16/2020  Current Dose per office note 05/06/2020: prednisone 4 mg po daily DX: Rheumatoid arthritis involving multiple sites with positive rheumatoid factor   Okay to refill per Dr. Estanislado Pandy

## 2020-06-18 ENCOUNTER — Ambulatory Visit: Payer: Medicare Other

## 2020-06-18 ENCOUNTER — Other Ambulatory Visit: Payer: Self-pay

## 2020-06-18 DIAGNOSIS — M25611 Stiffness of right shoulder, not elsewhere classified: Secondary | ICD-10-CM

## 2020-06-18 DIAGNOSIS — M25511 Pain in right shoulder: Secondary | ICD-10-CM | POA: Diagnosis not present

## 2020-06-18 DIAGNOSIS — R600 Localized edema: Secondary | ICD-10-CM

## 2020-06-18 DIAGNOSIS — R252 Cramp and spasm: Secondary | ICD-10-CM

## 2020-06-18 NOTE — Therapy (Signed)
[facility]. [place], [place], [address] Phone: [phone]   Fax:  [phone]  Physical Therapy Treatment  Patient Details  Name: Sophia Ward MRN: [id number] Date of Birth: 1933/03/04 Referring Provider (PT): Lysle Rubens   Encounter Date: 06/18/2020   PT End of Session - 06/18/20 1314    Visit Number 5    Date for PT Re-Evaluation 08/02/20    PT Start Time 4235    PT Stop Time 1100    PT Time Calculation (min) 45 min    Activity Tolerance Patient tolerated treatment well;Patient limited by pain    Behavior During Therapy [facility] for tasks assessed/performed           Past Medical History:  Diagnosis Date  . Allergic rhinitis   . Anemia 09/2019  . Arthritis    hands  . BPPV (benign paroxysmal positional vertigo)   . Coronary artery disease   . Diabetes mellitus without complication ([place])   . Essential hypertension, benign 01/30/2014  . GERD (gastroesophageal reflux disease)   . Hypertension   . Lesion of pancreas   . Mixed hyperlipidemia 01/30/2014  . Myocardial infarction ([place]) 2004   mild MI-no damage- had stents  . Osteopenia   . Post-menopausal   . Pulmonary nodules   . Rheumatoid arthritis ([place])   . S/P TAVR (transcatheter aortic valve replacement) 03/03/2020   s/p TAVR with a 23 mm Edwards S3U via the TF approach by Drs Burt Knack and Roxy Manns  . Severe aortic stenosis   . Spinal stenosis   . Stenosing tenosynovitis     Past Surgical History:  Procedure Laterality Date  . ABDOMINAL HYSTERECTOMY    . APPENDECTOMY    . BIOPSY  10/25/2019   Procedure: BIOPSY;  Surgeon: Otis Brace, MD;  Location: [facility];  Service: Gastroenterology;;  . BIOPSY  10/26/2019   Procedure: BIOPSY;  Surgeon: Otis Brace, MD;  Location: [facility];  Service: Gastroenterology;;  . CARDIAC CATHETERIZATION  2004  . carpel tunnel    . COLONOSCOPY WITH PROPOFOL N/A 10/30/2012   Procedure: COLONOSCOPY WITH  PROPOFOL;  Surgeon: Garlan Fair, MD;  [facility];  Service: Endoscopy;  Laterality: N/A;  . COLONOSCOPY WITH PROPOFOL N/A 10/26/2019   Procedure: COLONOSCOPY WITH PROPOFOL;  Surgeon: Otis Brace, MD;  Location: [facility];  Service: Gastroenterology;  Laterality: N/A;  . CORONARY ANGIOPLASTY  2004  . CORONARY ATHERECTOMY N/A 02/21/2020   Procedure: CORONARY ATHERECTOMY;  Surgeon: Jettie Booze, MD;  Location: [facility] CV LAB;  Service: Cardiovascular;  Laterality: N/A;  . CORONARY STENT INTERVENTION N/A 02/21/2020   Procedure: CORONARY STENT INTERVENTION;  Surgeon: Jettie Booze, MD;  Location: [facility];  Service: Cardiovascular;  Laterality: N/A;  . DILATION AND CURETTAGE OF UTERUS    . ESOPHAGOGASTRODUODENOSCOPY N/A 10/25/2019   Procedure: ESOPHAGOGASTRODUODENOSCOPY (EGD);  Surgeon: Otis Brace, MD;  Location[facility] END[facility];  Service: Gastroenterology;  Laterality: N/A;  . ESOPHAGOGASTRODUODENOSCOPY (EGD) WITH PROPOFOL N/A 10/30/2012   Procedure: ESOPHAGOGASTRODUODENOSCOPY (EGD) WITH PROPOFOL;  Surgeon: Garlan Fair, MD;  [facility];  Service: Endoscopy;  Laterality: N/A;  . EYE SURGERY     bilateral cataract with lens implant  . EYE SURGERY    . INJECTION OF SILICONE OIL Left 3/61/4431   Procedure: Injection Of Silicone Oil;  Surgeon: Jalene Mullet, MD;  Location: [facility];  Service: Ophthalmology;  Laterality: Left;  . INTRAVASCULAR PRESSURE WIRE/FFR STUDY N/A 01/29/2020   Procedure: INTRAVASCULAR  PRESSURE WIRE/FFR STUDY;  Surgeon: Jettie Booze, MD;  Location: [facility] CV LAB;  Service: Cardiovascular;  Laterality: N/A;  . INTRAVASCULAR ULTRASOUND/IVUS N/A 02/21/2020   Procedure: Intravascular Ultrasound/IVUS;  Surgeon: Jettie Booze, MD;  Location: [place] CV LAB;  Service: Cardiovascular;  Laterality: N/A;  . left shouler surgery    . PHOTOCOAGULATION WITH LASER Left 02/12/2019   Procedure:  Photocoagulation With Laser;  Surgeon: Jalene Mullet, MD;  Location: [facility];  Service: Ophthalmology;  Laterality: Left;  . 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 02/12/2019   Procedure: REPAIR OF COMPLEX TRACTION RETINAL DETACHMENT;  Surgeon: Jalene Mullet, MD;  Location: [facility];  Service: Ophthalmology;  Laterality: Left;  . RIGHT/LEFT HEART CATH AND CORONARY ANGIOGRAPHY N/A 01/29/2020   Procedure: RIGHT/LEFT HEART CATH AND CORONARY ANGIOGRAPHY;  Surgeon: Jettie Booze, MD;  Location: [facility] CV LAB;  Service: Cardiovascular;  Laterality: N/A;  . TEE WITHOUT CARDIOVERSION N/A 03/03/2020   Procedure: TRANSESOPHAGEAL ECHOCARDIOGRAM (TEE);  Surgeon: Sherren Mocha, MD;  Location: [facility];  Service: Open Heart Surgery;  Laterality: N/A;  . TONSILLECTOMY    . TRANSCATHETER AORTIC VALVE REPLACEMENT, TRANSFEMORAL  03/03/2020  . TRANSCATHETER AORTIC VALVE REPLACEMENT, TRANSFEMORAL N/A 03/03/2020   Procedure: TRANSCATHETER AORTIC VALVE REPLACEMENT, TRANSFEMORAL USING EDWARDS SAPIEN 3 23 MM AORTIC VALVE.;  Surgeon: Sherren Mocha, MD;  Location: [facility];  Service: Open Heart Surgery;  Laterality: N/A;  . VITRECTOMY 25 GAUGE WITH SCLERAL BUCKLE Left 02/12/2019   Procedure: Vitrectomy 25 Gauge With Scleral Buckle;  Surgeon: Jalene Mullet, MD;  Location: [facility];  Service: Ophthalmology;  Laterality: Left;    There were no vitals filed for this visit.   Subjective Assessment - 06/18/20 1018    Subjective Pt reports she decided not to follow up with MD about elbow wound and tried and ointment on it that she had at home. Hasnt had much shoulder pain past few days. Still doing exercises at home    Pertinent History RIGHT proximal humerus fx 04/18/20    Currently in Pain? No/denies   No pain at rest. begins to have some pain with reaching up   Pain Onset More than a month ago                [facility] Adult PT Treatment/Exercise - 06/18/20 0001      Shoulder Exercises: Supine   Flexion  --   Remireclined x 10     Shoulder Exercises: Seated   Retraction Limitations Seated scap retractions  tactile & verbal cues provided 10 x 2 with 3" holds    Flexion Limitations seated P/AAROM shoulder flexion x 10   assist at end range. trialed 10 reps shoulder flexion starting from supported 90 degree position   ABduction Limitations seated P/AAROM right shoulder abduction at table with cues for trunk movement x10   Seated abduction using cane x 10   Other Seated Exercises Scapular clocks (elevation, retraction, depression focus); Cane flexion AAROM in available/pain free range) x 10; cane ER within painfree range x10      Shoulder Exercises: Pulleys   Flexion 3 minutes    Scaption Limitations 3 minutes      Shoulder Exercises: Therapy Ball   Flexion Right;10 reps   standing at wall AA shoulder flexion ball rolls     Shoulder Exercises: ROM/Strengthening   Nustep 6 min LE only    [place]anger 10 reps x 2 fwd flexion to 90deg available range, AA with elbow supported as needed. 10x2  horizontal abd to 60 deg    Other ROM/Strengthening Exercises modified serratus punches, BW only, reclined x 5, verbal cues for maintaining elbow extension      Shoulder Exercises: Isometric Strengthening   Flexion Limitations x15 3 sec    Extension Limitations x15 3 sec      Shoulder Exercises: Stretch   Corner Stretch 30 seconds;2 reps   Doorway stretch ER   Other Shoulder Stretches Modified ER/ABD stretch with hand to head, 30 sec x 3, manual assistance      Manual Therapy   Manual therapy comments Seated PROM shoulder flexion, abduction, scaption, ER   gentle MWM for FF/ABD/ER   Soft tissue mobilization RIGHT pec minor stretch, STM to lateral scap mms                  PT Education - 06/18/20 1312    Education Details Updated HEP provided. Reviewed POC. Educated on monitoring s/sx of elbow wound infections and advised follow up with MD.    Terence Lux) Educated Patient    Methods  Explanation;Demonstration    Comprehension Verbalized understanding;Returned demonstration            PT Short Term Goals - 06/18/20 1323      PT SHORT TERM GOAL #1   Title Pt will be I with initial HEP    Time 2    Period Weeks    Status Achieved    Target Date 06/18/20             PT Long Term Goals - 06/18/20 1323      PT LONG TERM GOAL #1   Title Pt will demo R shoulder flexion to 160 deg    Status On-going    Target Date 07/30/20      PT LONG TERM GOAL #2   Title Pt will demo R shoulder abduction equivalent to LUE    Status On-going    Target Date 07/30/20      PT LONG TERM GOAL #3   Title Pt will report able to perform ADLs with R shoulder with </= 1/10 R shoulder pain    Status On-going    Target Date 07/30/20      PT LONG TERM GOAL #4   Title Pt will demo R shoulder MMT equivalent to L shoulder    Status On-going   Continued limitations in right shoulder ROM and MMT   Target Date 07/30/20                 Plan - 06/18/20 1316    Clinical Impression Statement Pt tolerated exercises nicely today. She continues to have difficulty with AA/AROM above 90-100 deg flexion or abduction on the right but responds nicely to ROM post gently PROM toward end range. Elbow wound shown today and has scabbed over, decreased redness  and minimal exudate compared to previous. Jessina continues to demo decreased ROM and strength and will benefit from continued skilled PT to work towards PLOF.    Personal Factors and Comorbidities Comorbidity 3+    Comorbidities RA, HTN, DM    Examination-Activity Limitations Carry;Lift;Reach Overhead    Examination-Participation Restrictions Community Activity;Interpersonal Relationship;Cleaning;Laundry;Meal Prep    Stability/Clinical Decision Making Stable/Uncomplicated    Clinical Decision Making Low    Rehab Potential Good    PT Frequency 2x / week    PT Duration 8 weeks    PT Treatment/Interventions ADLs/Self Care Home  Management;Electrical Stimulation;Iontophoresis 4mg /ml Dexamethasone;Moist Heat;Therapeutic activities;Therapeutic exercise;Patient/family education;Manual techniques;Passive range of motion  PT Next Visit Plan shoulder ROM to tolerance, slow progression of TE, manual/modalities as indicated.    PT Home Exercise Plan Supine/semireclineAA shoulder flexion, Cane shoulder ABD, ER doorway stretch    Consulted and Agree with Plan of Care Patient           Patient will benefit from skilled therapeutic intervention in order to improve the following deficits and impairments:  Decreased range of motion,Increased muscle spasms,Impaired UE functional use,Pain,Hypomobility,Impaired flexibility,Decreased strength,Postural dysfunction  Visit Diagnosis: Acute pain of right shoulder  Stiffness of right shoulder, not elsewhere classified  Cramp and spasm  Localized edema     Problem List Patient Active Problem List   Diagnosis Date Noted  . Acute on chronic diastolic heart failure ([place]) 03/04/2020  . S/P TAVR (transcatheter aortic valve replacement) 03/03/2020  . Diabetes mellitus without complication ([place])   . Pulmonary nodules   . Lesion of pancreas   . Severe aortic stenosis   . Pressure injury of skin 01/15/2020  . Iron deficiency anemia 01/06/2020  . Symptomatic anemia 10/23/2019  . Anemia 10/22/2019  . Polymyalgia rheumatica ([place]) 11/25/2016  . Primary osteoarthritis of both hands 11/25/2016  . Bilateral primary osteoarthritis of knee 11/25/2016  . Osteopenia of multiple sites 11/25/2016  . 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 Osteoarthritis of lumbar spine 10/28/2016  . History of gastroesophageal reflux (GERD) 10/27/2016  . Rheumatoid arthritis involving multiple sites with positive rheumatoid factor ([place]) 10/14/2016  . Coronary atherosclerosis of native coronary artery 01/30/2014  . Mixed hyperlipidemia 01/30/2014  . Essential hypertension, benign 01/30/2014   . Obesity, unspecified 01/30/2014    Hall Busing, PT, DPT 06/18/2020, 4:11 PM  [place]. [place], [place], [address] Phone: [phone]   Fax:  [phone]  Name: ALTAIR POLLMANN MRN: [id number] Date of Birth: September 17, 1932

## 2020-06-18 NOTE — Patient Instructions (Signed)
Access Code: [email or web address] URL: [email or web address] Date: 06/18/2020 Prepared by: Sherlynn Stalls  Exercises Supine Shoulder Flexion AAROM (uninvolved assisting involved/weaker arm)- 1 x daily - 7 x weekly - 3 sets - 10 reps Standing Shoulder External Rotation Stretch in Doorway - 1 x daily - 7 x weekly - 3 sets - 10 reps Seated Shoulder Abduction AAROM with [place] - 1 x daily - 7 x weekly - 3 sets - 10 reps

## 2020-06-22 ENCOUNTER — Ambulatory Visit: Payer: Medicare Other

## 2020-06-24 ENCOUNTER — Ambulatory Visit: Payer: Medicare Other

## 2020-06-24 ENCOUNTER — Other Ambulatory Visit: Payer: Self-pay

## 2020-06-24 DIAGNOSIS — M25611 Stiffness of right shoulder, not elsewhere classified: Secondary | ICD-10-CM | POA: Diagnosis not present

## 2020-06-24 DIAGNOSIS — R252 Cramp and spasm: Secondary | ICD-10-CM | POA: Diagnosis not present

## 2020-06-24 DIAGNOSIS — M25511 Pain in right shoulder: Secondary | ICD-10-CM

## 2020-06-24 DIAGNOSIS — R600 Localized edema: Secondary | ICD-10-CM | POA: Diagnosis not present

## 2020-06-24 NOTE — Therapy (Signed)
[employer]. [place], [place], [address] Phone: [phone]   Fax:  [phone]  Physical Therapy Treatment  Patient Details  Name: Sophia Ward MRN: [id number] Date of Birth: 11-17-1932 Referring Provider (PT): Lysle Rubens   Encounter Date: 06/24/2020   PT End of Session - 06/24/20 1200    Visit Number 6    Date for PT Re-Evaluation 08/02/20    PT Start Time 1100    PT Stop Time 1145    PT Time Calculation (min) 45 min    Activity Tolerance Patient tolerated treatment well;Patient limited by pain    Behavior During Therapy [facility] for tasks assessed/performed           Past Medical History:  Diagnosis Date  . Allergic rhinitis   . Anemia 09/2019  . Arthritis    hands  . BPPV (benign paroxysmal positional vertigo)   . Coronary artery disease   . Diabetes mellitus without complication ([place])   . Essential hypertension, benign 01/30/2014  . GERD (gastroesophageal reflux disease)   . Hypertension   . Lesion of pancreas   . Mixed hyperlipidemia 01/30/2014  . Myocardial infarction ([place]) 2004   mild MI-no damage- had stents  . Osteopenia   . Post-menopausal   . Pulmonary nodules   . Rheumatoid arthritis ([place])   . S/P TAVR (transcatheter aortic valve replacement) 03/03/2020   s/p TAVR with a 23 mm Edwards S3U via the TF approach by Drs Burt Knack and Roxy Manns  . Severe aortic stenosis   . Spinal stenosis   . Stenosing tenosynovitis     Past Surgical History:  Procedure Laterality Date  . ABDOMINAL HYSTERECTOMY    . APPENDECTOMY    . BIOPSY  10/25/2019   Procedure: BIOPSY;  Surgeon: Otis Brace, MD;  Location: [place];  Service: Gastroenterology;;  . BIOPSY  10/26/2019   Procedure: BIOPSY;  Surgeon: Otis Brace, MD;  Location: [facility];  Service: Gastroenterology;;  . CARDIAC CATHETERIZATION  2004  . carpel tunnel    . COLONOSCOPY WITH PROPOFOL N/A 10/30/2012   Procedure: COLONOSCOPY WITH  PROPOFOL;  Surgeon: Garlan Fair, MD;  [facility]: [facility];  Service: Endoscopy;  Laterality: N/A;  . COLONOSCOPY WITH PROPOFOL N/A 10/26/2019   Procedure: COLONOSCOPY WITH PROPOFOL;  Surgeon: Otis Brace, MD;  Location: [place];  Service: Gastroenterology;  Laterality: N/A;  . CORONARY ANGIOPLASTY  2004  . CORONARY ATHERECTOMY N/A 02/21/2020   Procedure: CORONARY ATHERECTOMY;  Surgeon: Jettie Booze, MD;  Location: [place] [facility];  Service: Cardiovascular;  Laterality: N/A;  . CORONARY STENT INTERVENTION N/A 02/21/2020   Procedure: CORONARY STENT INTERVENTION;  Surgeon: Jettie Booze, MD;  Location: [place] [facility];  Service: Cardiovascular;  Laterality: N/A;  . DILATION AND CURETTAGE OF UTERUS    . ESOPHAGOGASTRODUODENOSCOPY N/A 10/25/2019   Procedure: ESOPHAGOGASTRODUODENOSCOPY (EGD);  Surgeon: Otis Brace, MD;  Location: [facility];  Service: Gastroenterology;  Laterality: N/A;  . ESOPHAGOGASTRODUODENOSCOPY (EGD) WITH PROPOFOL N/A 10/30/2012   Procedure: ESOPHAGOGASTRODUODENOSCOPY (EGD) WITH PROPOFOL;  Surgeon: Garlan Fair, MD;  [facility];  Service: Endoscopy;  Laterality: N/A;  . EYE SURGERY     bilateral cataract with lens implant  . EYE SURGERY    . INJECTION OF SILICONE OIL Left [id number]   Procedure: Injection Of Silicone Oil;  Surgeon: Jalene [place], MD;  Location: [place];  Service: Ophthalmology;  Laterality: Left;  . INTRAVASCULAR PRESSURE WIRE/FFR STUDY N/A 01/29/2020   Procedure: INTRAVASCULAR  PRESSURE WIRE/FFR STUDY;  Surgeon: Jettie Booze, MD;  Location: [place] [facility];  Service: Cardiovascular;  Laterality: N/A;  . INTRAVASCULAR ULTRASOUND/IVUS N/A 02/21/2020   Procedure: Intravascular Ultrasound/IVUS;  Surgeon: Jettie Booze, MD;  Location: [place] [facility];  Service: Cardiovascular;  Laterality: N/A;  . left shouler surgery    . PHOTOCOAGULATION WITH LASER Left 02/12/2019   Procedure:  Photocoagulation With Laser;  Surgeon: Jalene [place], MD;  Location: [place];  Service: Ophthalmology;  Laterality: Left;  . 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 02/12/2019   Procedure: REPAIR OF COMPLEX TRACTION RETINAL DETACHMENT;  Surgeon: Jalene [place], MD;  Location: [place];  Service: Ophthalmology;  Laterality: Left;  . RIGHT/LEFT HEART CATH AND CORONARY ANGIOGRAPHY N/A 01/29/2020   Procedure: RIGHT/LEFT HEART CATH AND CORONARY ANGIOGRAPHY;  Surgeon: Jettie Booze, MD;  Location: [place] [facility];  Service: Cardiovascular;  Laterality: N/A;  . TEE WITHOUT CARDIOVERSION N/A 03/03/2020   Procedure: TRANSESOPHAGEAL ECHOCARDIOGRAM (TEE);  Surgeon: Sherren Mocha, MD;  Location: [place];  Service: Open Heart Surgery;  Laterality: N/A;  . TONSILLECTOMY    . TRANSCATHETER AORTIC VALVE REPLACEMENT, TRANSFEMORAL  03/03/2020  . TRANSCATHETER AORTIC VALVE REPLACEMENT, TRANSFEMORAL N/A 03/03/2020   Procedure: TRANSCATHETER AORTIC VALVE REPLACEMENT, TRANSFEMORAL USING EDWARDS SAPIEN 3 23 MM AORTIC VALVE.;  Surgeon: Sherren Mocha, MD;  Location: [place];  Service: Open Heart Surgery;  Laterality: N/A;  . VITRECTOMY 25 GAUGE WITH SCLERAL BUCKLE Left 02/12/2019   Procedure: Vitrectomy 25 Gauge With Scleral Buckle;  Surgeon: Jalene [place], MD;  Location: [place];  Service: Ophthalmology;  Laterality: Left;    There were no vitals filed for this visit.   Subjective Assessment - 06/24/20 1103    Subjective Pt reports she had some ms soreness in the shoulder after doing exercises yesterday but otherwise doing ok no pain. Pt reports elbow wound has healed over well. Sophia Ward reports her and her husband are planning to return to [place] on February 5th and next week she will have her last PT visits here. She follows up with MD next wednesday.    Pertinent History RIGHT proximal humerus fx 04/18/20    Currently in Pain? No/denies    Pain Onset More than a month ago                              [facility] Adult PT Treatment/Exercise - 06/24/20 0001      Exercises   Exercises Elbow      Elbow Exercises   Elbow Flexion 5 reps;Both   2 sets, reclined with arm supported   Elbow Extension 10 reps;Right    Theraband Level (Elbow Extension) Level 1 (Yellow)      Shoulder Exercises: Supine   External Rotation Strengthening;Right;5 reps   2 sets, reclined with arm supported   Theraband Level (Shoulder External Rotation) Level 1 (Yellow)    Flexion AAROM;10 reps   Reclined, 2 sets, within available pain free range     Shoulder Exercises: Seated   Retraction Limitations Seated scap retractions  tactile & verbal cues provided 10 x 2 with 3" holds    Flexion Limitations seated P/AAROM shoulder flexion x 10   within available ROM   ABduction Limitations seated P/AAROM right shoulder abduction x10 with cane  within available pain free range    Other Seated Exercises Scapular clocks (elevation, retraction, depression focus); Cane flexion AAROM in available/pain free range) x 10; cane  ER within painfree range x10      Shoulder Exercises: Pulleys   Flexion 3 minutes    Scaption Limitations 3 minutes      Shoulder Exercises: Therapy Ball   Flexion Right;10 reps   standing at wall AA shoulder flexion ball rolls     Shoulder Exercises: Isometric Strengthening   Flexion Limitations x15 3 sec    Extension Limitations x15 3 sec      Shoulder Exercises: Stretch   Corner Stretch 30 seconds;2 reps   Doorway ER     Manual Therapy   Manual therapy comments Seated PROM shoulder flexion, abduction, scaption, ER   gentle MWM/ manually assisted AAROM for FF/ABD/ER with elbow flexed   Soft tissue mobilization RIGHT pec minor stretch, STM to lateral scap mms                  PT Education - 06/24/20 1158    Education Details Reviewed POC, HEP with reintroduction of pendulums as tolerated 2/2 feelings of tightness. Discussed Discharge planning from PT here  with move back to [facility], and educated on importance of follow up to continue with PT upon returning to [facility].    Person(s) Educated Patient    Methods Explanation    Comprehension Verbalized understanding;Returned demonstration            PT Short Term Goals - 06/18/20 1323      PT SHORT TERM GOAL #1   Title Pt will be I with initial HEP    Time 2    Period Weeks    Status Achieved    Target Date 06/18/20             PT Long Term Goals - 06/18/20 1323      PT LONG TERM GOAL #1   Title Pt will demo R shoulder flexion to 160 deg    Status On-going    Target Date 07/30/20      PT LONG TERM GOAL #2   Title Pt will demo R shoulder abduction equivalent to LUE    Status On-going    Target Date 07/30/20      PT LONG TERM GOAL #3   Title Pt will report able to perform ADLs with R shoulder with </= 1/10 R shoulder pain    Status On-going    Target Date 07/30/20      PT LONG TERM GOAL #4   Title Pt will demo R shoulder MMT equivalent to L shoulder    Status On-going   Continued limitations in right shoulder ROM and MMT   Target Date 07/30/20                 Plan - 06/24/20 1201    Clinical Impression Statement Pt tolerated all exercises nicely today but continues to be limited by tightness and some pain at end of available range. She c/o some superior shoulder pain only with A or AAROM at or attempting to go above 90-100 deg flexion or abd on the right. She continues to also fatigue quickly with exercises in available range. Discussed heavily today discharge plan from our clinic with plan to continue therapy upn return to [facility] to continue to work towards achieving PLOF and she was in agreement with plan.    Personal Factors and Comorbidities Comorbidity 3+    Comorbidities RA, HTN, DM    Examination-Activity Limitations Carry;Lift;Reach Overhead    Examination-Participation Restrictions Community Activity;Interpersonal Relationship;Cleaning;Laundry;Meal Prep     Rehab Potential Good  PT Frequency 2x / week    PT Duration 8 weeks    PT Treatment/Interventions ADLs/Self Care Home Management;Electrical Stimulation;Iontophoresis 4mg /ml Dexamethasone;Moist Heat;Therapeutic activities;Therapeutic exercise;Patient/family education;Manual techniques;Passive range of motion    PT Next Visit Plan shoulder ROM to tolerance, slow progression of TE, manual/modalities as indicated. Next weeks visits will be her last at this clinic (moving back to [facility] February 5th). Is seeing MD next wednesday, complete progress update/update goals and FOTO next visit.    Consulted and Agree with Plan of Care Patient           Patient will benefit from skilled therapeutic intervention in order to improve the following deficits and impairments:  Decreased range of motion,Increased muscle spasms,Impaired UE functional use,Pain,Hypomobility,Impaired flexibility,Decreased strength,Postural dysfunction  Visit Diagnosis: Acute pain of right shoulder  Stiffness of right shoulder, not elsewhere classified  Cramp and spasm  Localized edema     Problem List Patient Active Problem List   Diagnosis Date Noted  . Acute on chronic diastolic heart failure ([place]) 03/04/2020  . S/P TAVR (transcatheter aortic valve replacement) 03/03/2020  . Diabetes mellitus without complication ([place])   . Pulmonary nodules   . Lesion of pancreas   . Severe aortic stenosis   . Pressure injury of skin 01/15/2020  . Iron deficiency anemia 01/06/2020  . Symptomatic anemia 10/23/2019  . Anemia 10/22/2019  . Polymyalgia rheumatica ([place]) 11/25/2016  . Primary osteoarthritis of both hands 11/25/2016  . Bilateral primary osteoarthritis of knee 11/25/2016  . Osteopenia of multiple sites 11/25/2016  . 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 Osteoarthritis of lumbar spine 10/28/2016  . History of gastroesophageal reflux (GERD) 10/27/2016  . Rheumatoid arthritis involving multiple sites with  positive rheumatoid factor ([place]) 10/14/2016  . Coronary atherosclerosis of native coronary artery 01/30/2014  . Mixed hyperlipidemia 01/30/2014  . Essential hypertension, benign 01/30/2014  . Obesity, unspecified 01/30/2014    Hall Busing, PT, DPT 06/24/2020, 12:07 PM  [place]. [place], [place], [address] Phone: [phone]   Fax:  [phone]  Name: Sophia Ward MRN: [id number] Date of Birth: 1932/11/02

## 2020-06-26 ENCOUNTER — Other Ambulatory Visit: Payer: Self-pay

## 2020-06-26 ENCOUNTER — Ambulatory Visit: Payer: Medicare Other

## 2020-06-26 DIAGNOSIS — M25611 Stiffness of right shoulder, not elsewhere classified: Secondary | ICD-10-CM | POA: Diagnosis not present

## 2020-06-26 DIAGNOSIS — M25511 Pain in right shoulder: Secondary | ICD-10-CM | POA: Diagnosis not present

## 2020-06-26 DIAGNOSIS — R252 Cramp and spasm: Secondary | ICD-10-CM | POA: Diagnosis not present

## 2020-06-26 DIAGNOSIS — R600 Localized edema: Secondary | ICD-10-CM

## 2020-06-26 NOTE — Therapy (Signed)
[place]. [place], [place], [address] Phone: [phone]   Fax:  [phone]  Physical Therapy Treatment  Patient Details  Name: Sophia Ward MRN: [id number] Date of Birth: 1932-11-04 Referring Provider (PT): Lysle Rubens   Encounter Date: 06/26/2020   PT End of Session - 06/26/20 1022    Visit Number 7    Date for PT Re-Evaluation 08/02/20    PT Start Time 0930    PT Stop Time 3235    PT Time Calculation (min) 45 min    Activity Tolerance Patient tolerated treatment well;Patient limited by pain    Behavior During Therapy [facility] for tasks assessed/performed           Past Medical History:  Diagnosis Date  . Allergic rhinitis   . Anemia 09/2019  . Arthritis    hands  . BPPV (benign paroxysmal positional vertigo)   . Coronary artery disease   . Diabetes mellitus without complication ([place])   . Essential hypertension, benign 01/30/2014  . GERD (gastroesophageal reflux disease)   . Hypertension   . Lesion of pancreas   . Mixed hyperlipidemia 01/30/2014  . Myocardial infarction ([place]) 2004   mild MI-no damage- had stents  . Osteopenia   . Post-menopausal   . Pulmonary nodules   . Rheumatoid arthritis ([place])   . S/P TAVR (transcatheter aortic valve replacement) 03/03/2020   s/p TAVR with a 23 mm Edwards S3U via the TF approach by Drs Burt Knack and Roxy Manns  . Severe aortic stenosis   . Spinal stenosis   . Stenosing tenosynovitis     Past Surgical History:  Procedure Laterality Date  . ABDOMINAL HYSTERECTOMY    . APPENDECTOMY    . BIOPSY  10/25/2019   Procedure: BIOPSY;  Surgeon: Otis Brace, MD;  Location: [place];  Service: Gastroenterology;;  . BIOPSY  10/26/2019   Procedure: BIOPSY;  Surgeon: Otis Brace, MD;  Location: [place] [facility];  Service: Gastroenterology;;  . CARDIAC CATHETERIZATION  2004  . carpel tunnel    . COLONOSCOPY WITH PROPOFOL N/A 10/30/2012   Procedure: COLONOSCOPY WITH  PROPOFOL;  Surgeon: Garlan Fair, MD;  [facility];  Service: Endoscopy;  Laterality: N/A;  . COLONOSCOPY WITH PROPOFOL N/A 10/26/2019   Procedure: COLONOSCOPY WITH PROPOFOL;  Surgeon: Otis Brace, MD;  Location: [facility];  Service: Gastroenterology;  Laterality: N/A;  . CORONARY ANGIOPLASTY  2004  . CORONARY ATHERECTOMY N/A 02/21/2020   Procedure: CORONARY ATHERECTOMY;  Surgeon: Jettie Booze, MD;  Location: [place] [facility];  Service: Cardiovascular;  Laterality: N/A;  . CORONARY STENT INTERVENTION N/A 02/21/2020   Procedure: CORONARY STENT INTERVENTION;  Surgeon: Jettie Booze, MD;  Location: [place] [facility];  Service: Cardiovascular;  Laterality: N/A;  . DILATION AND CURETTAGE OF UTERUS    . ESOPHAGOGASTRODUODENOSCOPY N/A 10/25/2019   Procedure: ESOPHAGOGASTRODUODENOSCOPY (EGD);  Surgeon: Otis Brace, MD;  Location: [facility];  Service: Gastroenterology;  Laterality: N/A;  . ESOPHAGOGASTRODUODENOSCOPY (EGD) WITH PROPOFOL N/A 10/30/2012   Procedure: ESOPHAGOGASTRODUODENOSCOPY (EGD) WITH PROPOFOL;  Surgeon: Garlan Fair, MD;  [facility];  Service: Endoscopy;  Laterality: N/A;  . EYE SURGERY     bilateral cataract with lens implant  . EYE SURGERY    . INJECTION OF SILICONE OIL Left 5/73/2202   Procedure: Injection Of Silicone Oil;  Surgeon: Jalene Mullet, MD;  Location: [place];  Service: Ophthalmology;  Laterality: Left;  . INTRAVASCULAR PRESSURE WIRE/FFR STUDY N/A 01/29/2020   Procedure: INTRAVASCULAR  PRESSURE WIRE/FFR STUDY;  Surgeon: Jettie Booze, MD;  Location: [place] [facility];  Service: Cardiovascular;  Laterality: N/A;  . INTRAVASCULAR ULTRASOUND/IVUS N/A 02/21/2020   Procedure: Intravascular Ultrasound/IVUS;  Surgeon: Jettie Booze, MD;  Location: [place] [facility];  Service: Cardiovascular;  Laterality: N/A;  . left shouler surgery    . PHOTOCOAGULATION WITH LASER Left 02/12/2019   Procedure:  Photocoagulation With Laser;  Surgeon: Jalene Mullet, MD;  Location: [place];  Service: Ophthalmology;  Laterality: Left;  . 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 02/12/2019   Procedure: REPAIR OF COMPLEX TRACTION RETINAL DETACHMENT;  Surgeon: Jalene Mullet, MD;  Location: [place];  Service: Ophthalmology;  Laterality: Left;  . RIGHT/LEFT HEART CATH AND CORONARY ANGIOGRAPHY N/A 01/29/2020   Procedure: RIGHT/LEFT HEART CATH AND CORONARY ANGIOGRAPHY;  Surgeon: Jettie Booze, MD;  Location: [place] [facility];  Service: Cardiovascular;  Laterality: N/A;  . TEE WITHOUT CARDIOVERSION N/A 03/03/2020   Procedure: TRANSESOPHAGEAL ECHOCARDIOGRAM (TEE);  Surgeon: Sherren Mocha, MD;  Location: [place];  Service: Open Heart Surgery;  Laterality: N/A;  . TONSILLECTOMY    . TRANSCATHETER AORTIC VALVE REPLACEMENT, TRANSFEMORAL  03/03/2020  . TRANSCATHETER AORTIC VALVE REPLACEMENT, TRANSFEMORAL N/A 03/03/2020   Procedure: TRANSCATHETER AORTIC VALVE REPLACEMENT, TRANSFEMORAL USING EDWARDS SAPIEN 3 23 MM AORTIC VALVE.;  Surgeon: Sherren Mocha, MD;  Location: [place];  Service: Open Heart Surgery;  Laterality: N/A;  . VITRECTOMY 25 GAUGE WITH SCLERAL BUCKLE Left 02/12/2019   Procedure: Vitrectomy 25 Gauge With Scleral Buckle;  Surgeon: Jalene Mullet, MD;  Location: [place];  Service: Ophthalmology;  Laterality: Left;    There were no vitals filed for this visit.   Subjective Assessment - 06/26/20 0932    Subjective Pt reports no new complaints since previous. MD appointment is early wednesday before PT appointment.    Pertinent History RIGHT proximal humerus fx 04/18/20    Currently in Pain? No/denies                             [facility] Adult PT Treatment/Exercise - 06/26/20 0001      Elbow Exercises   Elbow Flexion 10 reps;Both   3#, 2 sets   Elbow Extension 10 reps;Right   2 sets   Theraband Level (Elbow Extension) Level 1 (Yellow)      Shoulder Exercises: Supine    External Rotation Strengthening;Right;5 reps    Theraband Level (Shoulder External Rotation) Level 1 (Yellow)    Flexion AAROM;10 reps    Other Supine Exercises Semireclined chest press 10 x 2 c 1# dowel. ER/IR with 1# DB 10x2.      Shoulder Exercises: Seated   Elevation Strengthening;Right   Biceps/triceps x10   Retraction Limitations Seated scap retractions  tactile & verbal cues provided 10 x 2 with 3" holds      Shoulder Exercises: Pulleys   Flexion 3 minutes    Scaption Limitations 3 minutes      Shoulder Exercises: ROM/Strengthening   UBE (Upper Arm Bike) lvl 1, 2 min fwd 2 min bwd      Shoulder Exercises: Stretch   Corner Stretch 30 seconds;2 reps      Manual Therapy   Manual therapy comments Seated PROM shoulder flexion, abduction, scaption, ER    Soft tissue mobilization RIGHT pec minor stretch, STM to lateral scap mms  PT Short Term Goals - 06/18/20 1323      PT SHORT TERM GOAL #1   Title Pt will be I with initial HEP    Time 2    Period Weeks    Status Achieved    Target Date 06/18/20             PT Long Term Goals - 06/26/20 1043      PT LONG TERM GOAL #1   Title Pt will demo R shoulder flexion to 160 deg    Baseline 120-125 deg    Time 8    Period Weeks    Status On-going      PT LONG TERM GOAL #2   Title Pt will demo R shoulder abduction equivalent to LUE    Status On-going      PT LONG TERM GOAL #3   Title Pt will report able to perform ADLs with R shoulder with </= 1/10 R shoulder pain    Baseline with motion within available range    Status On-going      PT LONG TERM GOAL #4   Status On-going                 Plan - 06/26/20 0942    Clinical Impression Statement Pt is approximately 10 wks post R proximal humerus fx. Pt tolerates exercises nicely within available range but continues to demo shoulder ROM limitations with flexion ROM measured to 120 deg . Initiated UBE today and it was tolerated well s c/o  pain or discomfort.  Sophia Ward continues to have difficulty with ROM above 120deg 2/2 tightness and feeling of pressure at top of shoulder.    Personal Factors and Comorbidities Comorbidity 3+    Comorbidities RA, HTN, DM    Examination-Activity Limitations Carry;Lift;Reach Overhead    Examination-Participation Restrictions [place]    Rehab Potential Good    PT Frequency 2x / week    PT Duration 8 weeks    PT Treatment/Interventions ADLs/Self Care Home Management;Electrical Stimulation;Iontophoresis 4mg /ml Dexamethasone;Moist Heat;Therapeutic activities;Therapeutic exercise;Patient/family education;Manual techniques;Passive range of motion    PT Next Visit Plan shoulder ROM to tolerance, slow progression of TE, manual/modalities as indicated. Next weeks visits will be her last at this clinic (moving back to [facility] February 5th). Is seeing MD next wednesday, complete progress update goals next visit. Needs updated HEP for planned d/c. Can progress time on UBE next visit.    Consulted and Agree with Plan of Care Patient           Patient will benefit from skilled therapeutic intervention in order to improve the following deficits and impairments:  Decreased range of motion,Increased muscle spasms,Impaired UE functional use,Pain,Hypomobility,Impaired flexibility,Decreased strength,Postural dysfunction  Visit Diagnosis: Acute pain of right shoulder  Stiffness of right shoulder, not elsewhere classified  Cramp and spasm  Localized edema     Problem List Patient Active Problem List   Diagnosis Date Noted  . Acute on chronic diastolic heart failure ([place]) 03/04/2020  . S/P TAVR (transcatheter aortic valve replacement) 03/03/2020  . Diabetes mellitus without complication ([place])   . Pulmonary nodules   . Lesion of pancreas   . Severe aortic stenosis   . Pressure injury of skin 01/15/2020  . Iron deficiency anemia 01/06/2020  . Symptomatic anemia 10/23/2019  . Anemia 10/22/2019  .  Polymyalgia rheumatica ([place]) 11/25/2016  . Primary osteoarthritis of both hands 11/25/2016  . Bilateral primary osteoarthritis of knee 11/25/2016  . Osteopenia of multiple sites 11/25/2016  .  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 Osteoarthritis of lumbar spine 10/28/2016  . History of gastroesophageal reflux (GERD) 10/27/2016  . Rheumatoid arthritis involving multiple sites with positive rheumatoid factor ([place]) 10/14/2016  . Coronary atherosclerosis of native coronary artery 01/30/2014  . Mixed hyperlipidemia 01/30/2014  . Essential hypertension, benign 01/30/2014  . Obesity, unspecified 01/30/2014    Hall Busing, PT, DPT 06/26/2020, 10:48 AM  [place]. [place], [place], [address] Phone: [phone]   Fax:  [phone]  Name: Sophia Ward MRN: [id number] Date of Birth: Sep 29, 1932

## 2020-06-29 ENCOUNTER — Other Ambulatory Visit: Payer: Self-pay

## 2020-06-29 ENCOUNTER — Ambulatory Visit: Payer: Medicare Other

## 2020-06-29 DIAGNOSIS — R252 Cramp and spasm: Secondary | ICD-10-CM

## 2020-06-29 DIAGNOSIS — M25611 Stiffness of right shoulder, not elsewhere classified: Secondary | ICD-10-CM | POA: Diagnosis not present

## 2020-06-29 DIAGNOSIS — R600 Localized edema: Secondary | ICD-10-CM | POA: Diagnosis not present

## 2020-06-29 DIAGNOSIS — M25511 Pain in right shoulder: Secondary | ICD-10-CM | POA: Diagnosis not present

## 2020-06-29 NOTE — Patient Instructions (Signed)
Access Code: [email or web address] URL: [email or web address] Date: 06/29/2020 Prepared by: Sherlynn Stalls  Exercises Standing Shoulder Abduction Finger Walk at Wall - 1 x daily - 7 x weekly - 4 sets - 5 reps Standing Shoulder Flexion Wall Walk - 1 x daily - 7 x weekly - 4 sets - 5 reps Seated Shoulder External Rotation PROM on Table - 1 x daily - 7 x weekly - 4 sets

## 2020-06-29 NOTE — Therapy (Signed)
[address] [place], [place], [address] Phone: [phone]   Fax:  [phone]  Physical Therapy Treatment/ Progress update  Patient Details  Name: Sophia Ward MRN: [id number] Date of Birth: Feb 18, 1933 Referring Provider (PT): [place]   Reporting Period 06/04/20  to 06/29/20  See note below for Objective Data and Assessment of Progress/Goals.     Encounter Date: 06/29/2020   PT End of Session - 06/29/20 1209    Visit Number 8    Date for PT Re-Evaluation 08/02/20    PT Start Time 5053    PT Stop Time 1100    PT Time Calculation (min) 45 min    Activity Tolerance Patient tolerated treatment well;Patient limited by pain    Behavior During Therapy [facility] for tasks assessed/performed           Past Medical History:  Diagnosis Date  . Allergic rhinitis   . Anemia 09/2019  . Arthritis    hands  . BPPV (benign paroxysmal positional vertigo)   . Coronary artery disease   . Diabetes mellitus without complication ([place])   . Essential hypertension, benign 01/30/2014  . GERD (gastroesophageal reflux disease)   . Hypertension   . Lesion of pancreas   . Mixed hyperlipidemia 01/30/2014  . Myocardial infarction ([place]) 2004   mild MI-no damage- had stents  . Osteopenia   . Post-menopausal   . Pulmonary nodules   . Rheumatoid arthritis ([place])   . S/P TAVR (transcatheter aortic valve replacement) 03/03/2020   s/p TAVR with a 23 mm Edwards S3U via the TF approach by Drs Burt Knack and Roxy Manns  . Severe aortic stenosis   . Spinal stenosis   . Stenosing tenosynovitis     Past Surgical History:  Procedure Laterality Date  . ABDOMINAL HYSTERECTOMY    . APPENDECTOMY    . BIOPSY  10/25/2019   Procedure: BIOPSY;  Surgeon: Otis Brace, MD;  Location: [place];  Service: Gastroenterology;;  . BIOPSY  10/26/2019   Procedure: BIOPSY;  Surgeon: Otis Brace, MD;  Location: [place] [facility];  Service: Gastroenterology;;   . CARDIAC CATHETERIZATION  2004  . carpel tunnel    . COLONOSCOPY WITH PROPOFOL N/A 10/30/2012   Procedure: COLONOSCOPY WITH PROPOFOL;  Surgeon: Garlan Fair, MD;  [facility]: [facility];  Service: Endoscopy;  Laterality: N/A;  . COLONOSCOPY WITH PROPOFOL N/A 10/26/2019   Procedure: COLONOSCOPY WITH PROPOFOL;  Surgeon: Otis Brace, MD;  Location: [place];  Service: Gastroenterology;  Laterality: N/A;  . CORONARY ANGIOPLASTY  2004  . CORONARY ATHERECTOMY N/A 02/21/2020   Procedure: CORONARY ATHERECTOMY;  Surgeon: Jettie Booze, MD;  Location: [place] [facility];  Service: Cardiovascular;  Laterality: N/A;  . CORONARY STENT INTERVENTION N/A 02/21/2020   Procedure: CORONARY STENT INTERVENTION;  Surgeon: Jettie Booze, MD;  Location: [place] [facility];  Service: Cardiovascular;  Laterality: N/A;  . DILATION AND CURETTAGE OF UTERUS    . ESOPHAGOGASTRODUODENOSCOPY N/A 10/25/2019   Procedure: ESOPHAGOGASTRODUODENOSCOPY (EGD);  Surgeon: Otis Brace, MD;  Location: [facility];  Service: Gastroenterology;  Laterality: N/A;  . ESOPHAGOGASTRODUODENOSCOPY (EGD) WITH PROPOFOL N/A 10/30/2012   Procedure: ESOPHAGOGASTRODUODENOSCOPY (EGD) WITH PROPOFOL;  Surgeon: Garlan Fair, MD;  [facility];  Service: Endoscopy;  Laterality: N/A;  . EYE SURGERY     bilateral cataract with lens implant  . EYE SURGERY    . INJECTION OF SILICONE OIL Left 9/76/7341   Procedure: Injection Of Silicone Oil;  Surgeon: Jalene Mullet,  MD;  Location: [place];  Service: Ophthalmology;  Laterality: Left;  . INTRAVASCULAR PRESSURE WIRE/FFR STUDY N/A 01/29/2020   Procedure: INTRAVASCULAR PRESSURE WIRE/FFR STUDY;  Surgeon: Jettie Booze, MD;  Location: [place] [facility];  Service: Cardiovascular;  Laterality: N/A;  . INTRAVASCULAR ULTRASOUND/IVUS N/A 02/21/2020   Procedure: Intravascular Ultrasound/IVUS;  Surgeon: Jettie Booze, MD;  Location: [place] [facility];  Service:  Cardiovascular;  Laterality: N/A;  . left shouler surgery    . PHOTOCOAGULATION WITH LASER Left 02/12/2019   Procedure: Photocoagulation With Laser;  Surgeon: Jalene Mullet, MD;  Location: [place];  Service: Ophthalmology;  Laterality: Left;  . 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 02/12/2019   Procedure: REPAIR OF COMPLEX TRACTION RETINAL DETACHMENT;  Surgeon: Jalene Mullet, MD;  Location: [place];  Service: Ophthalmology;  Laterality: Left;  . RIGHT/LEFT HEART CATH AND CORONARY ANGIOGRAPHY N/A 01/29/2020   Procedure: RIGHT/LEFT HEART CATH AND CORONARY ANGIOGRAPHY;  Surgeon: Jettie Booze, MD;  Location: [place] CV LAB;  Service: Cardiovascular;  Laterality: N/A;  . TEE WITHOUT CARDIOVERSION N/A 03/03/2020   Procedure: TRANSESOPHAGEAL ECHOCARDIOGRAM (TEE);  Surgeon: Sherren Mocha, MD;  Location: [place];  Service: Open Heart Surgery;  Laterality: N/A;  . TONSILLECTOMY    . TRANSCATHETER AORTIC VALVE REPLACEMENT, TRANSFEMORAL  03/03/2020  . TRANSCATHETER AORTIC VALVE REPLACEMENT, TRANSFEMORAL N/A 03/03/2020   Procedure: TRANSCATHETER AORTIC VALVE REPLACEMENT, TRANSFEMORAL USING EDWARDS SAPIEN 3 23 MM AORTIC VALVE.;  Surgeon: Sherren Mocha, MD;  Location: [place];  Service: Open Heart Surgery;  Laterality: N/A;  . VITRECTOMY 25 GAUGE WITH SCLERAL BUCKLE Left 02/12/2019   Procedure: Vitrectomy 25 Gauge With Scleral Buckle;  Surgeon: Jalene Mullet, MD;  Location: [place];  Service: Ophthalmology;  Laterality: Left;    There were no vitals filed for this visit.   Subjective Assessment - 06/29/20 1016    Subjective Pt reports no new complaints since previous. MD appointment is early wednesday before PT appointment.    Pertinent History RIGHT proximal humerus fx 04/18/20    Currently in Pain? No/denies                             [facility] Adult PT Treatment/Exercise - 06/29/20 0001      Elbow Exercises   Elbow Flexion 10 reps;Both   2 sets   Bar  Weights/Barbell (Elbow Flexion) 3 lbs    Elbow Extension 10 reps;Right   2 sets   Theraband Level (Elbow Extension) Level 1 (Yellow)      Shoulder Exercises: Supine   External Rotation Strengthening;Right;5 reps    Theraband Level (Shoulder External Rotation) Level 1 (Yellow)    Flexion AROM;10 reps    Other Supine Exercises Semireclined chest press + shoulder flexion  10 x 2 c 2# dowel. ER/IR with 1# DB 10x2.      Shoulder Exercises: Seated   Retraction Limitations Seated scap retractions  tactile & verbal cues provided 10 x 2 with 3" holds    Flexion Limitations seated AA/AROM shoulder flexion x 10    ABduction Limitations seated P/AAROM right shoulder abduction x10 with cane  within available pain free range      Shoulder Exercises: Sidelying   Other Sidelying Exercises IR/ER x 10 each      Shoulder Exercises: Standing   Other Standing Exercises Finger walks flexion and abduction 5 x 4 each      Shoulder Exercises: Pulleys   Flexion 3 minutes  Scaption Limitations 3 minutes      Shoulder Exercises: ROM/Strengthening   UBE (Upper Arm Bike) lvl 1, 3 min fwd 3 min bwd      Shoulder Exercises: Isometric Strengthening   Flexion Limitations x15 3 sec    Extension Limitations x15 3 sec      Shoulder Exercises: Stretch   Corner Stretch 30 seconds;2 reps    Other Shoulder Stretches Seated ER stretch with forearm on table   HEP component     Manual Therapy   Manual therapy comments Seated PROM shoulder flexion, abduction, scaption, ER    Soft tissue mobilization RIGHT pec minor stretch, STM to lateral scap mms                  PT Education - 06/29/20 1207    Education Details Reviewed POC, HEP. Discussed D/C planning with plan to contnue PT in [place].    Person(s) Educated Patient    Methods Explanation    Comprehension Verbalized understanding;Returned demonstration            PT Short Term Goals - 06/18/20 1323      PT SHORT TERM GOAL #1   Title Pt will be  I with initial HEP    Time 2    Period Weeks    Status Achieved    Target Date 06/18/20             PT Long Term Goals - 06/29/20 1212      PT LONG TERM GOAL #1   Title Pt will demo R shoulder flexion to 160 deg    Baseline 120-125 deg    Status On-going      PT LONG TERM GOAL #2   Title Pt will demo R shoulder abduction equivalent to LUE    Baseline able to get to 120 deg but fatigues quickly with compensations to 100de with repetition.    Status On-going      PT LONG TERM GOAL #3   Title Pt will report able to perform ADLs with R shoulder with </= 1/10 R shoulder pain    Baseline Continued tightness and weakness into ER with ABD limiting ADLs    Status On-going      PT LONG TERM GOAL #4   Title Pt will demo R shoulder MMT equivalent to L shoulder    Status On-going                 Plan - 06/29/20 1023    Clinical Impression Statement Pt is 10 weeks post R proximal humerus fx and she is making gradual progress towards goals.  Right shoulder ROM currently limited to 120 deg flexion and abduction with some pain reported at end range. Strength is grossly limited within available range as well at the shoulder as evidenced by limitations in shoulder active ROM, quick muscle fatigue. Pt reports her and her husband still plan on returning to [facility] on Saturday Feb 5th. Given her current status, pt will benefit from continued skilled physical therapy upon return to [facility] to continue to improve on functional shoulder strength ad mobility. Plan for 1 last visit with discharge HEP education later this week.    Personal Factors and Comorbidities Comorbidity 3+    Comorbidities RA, HTN, DM    Examination-Activity Limitations Carry;Lift;Reach Overhead    Examination-Participation Restrictions Church    Rehab Potential Good    PT Treatment/Interventions ADLs/Self Care Home Management;Electrical Stimulation;Iontophoresis 4mg /ml Dexamethasone;Moist Heat;Therapeutic  activities;Therapeutic exercise;Patient/family education;Manual techniques;Passive range  of motion    PT Next Visit Plan shoulder ROM to tolerance, slow progression of TE, manual/modalities as indicated. Next visit will be her last at this clinic (moving back to [facility] February 5th). Is seeing MD wednesday. Review discharge HEP.    PT Home Exercise Plan Finger walks up wall for flexion and abduction, seated ER stretch    Recommended Other Services It is recommended pt continue with skilled therapy upon return to [facility].    Consulted and Agree with Plan of Care Patient           Patient will benefit from skilled therapeutic intervention in order to improve the following deficits and impairments:  Decreased range of motion,Increased muscle spasms,Impaired UE functional use,Pain,Hypomobility,Impaired flexibility,Decreased strength,Postural dysfunction  Visit Diagnosis: Acute pain of right shoulder  Stiffness of right shoulder, not elsewhere classified  Cramp and spasm  Localized edema     Problem List Patient Active Problem List   Diagnosis Date Noted  . Acute on chronic diastolic heart failure ([place]) 03/04/2020  . S/P TAVR (transcatheter aortic valve replacement) 03/03/2020  . Diabetes mellitus without complication ([place])   . Pulmonary nodules   . Lesion of pancreas   . Severe aortic stenosis   . Pressure injury of skin 01/15/2020  . Iron deficiency anemia 01/06/2020  . Symptomatic anemia 10/23/2019  . Anemia 10/22/2019  . Polymyalgia rheumatica ([place]) 11/25/2016  . Primary osteoarthritis of both hands 11/25/2016  . Bilateral primary osteoarthritis of knee 11/25/2016  . Osteopenia of multiple sites 11/25/2016  . 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 Osteoarthritis of lumbar spine 10/28/2016  . History of gastroesophageal reflux (GERD) 10/27/2016  . Rheumatoid arthritis involving multiple sites with positive rheumatoid factor ([place]) 10/14/2016  . Coronary  atherosclerosis of native coronary artery 01/30/2014  . Mixed hyperlipidemia 01/30/2014  . Essential hypertension, benign 01/30/2014  . Obesity, unspecified 01/30/2014    Hall Busing, PT, DPT 06/29/2020, 12:16 PM  [address]. [place], [place], [address] Phone: [phone]   Fax:  [phone]  Name: Sophia Ward MRN: [id number] Date of Birth: Aug 20, 1932

## 2020-07-01 ENCOUNTER — Ambulatory Visit: Payer: Medicare Other | Attending: Orthopaedic Surgery

## 2020-07-01 ENCOUNTER — Other Ambulatory Visit: Payer: Self-pay

## 2020-07-01 DIAGNOSIS — R600 Localized edema: Secondary | ICD-10-CM | POA: Diagnosis not present

## 2020-07-01 DIAGNOSIS — R252 Cramp and spasm: Secondary | ICD-10-CM | POA: Insufficient documentation

## 2020-07-01 DIAGNOSIS — S42251A Displaced fracture of greater tuberosity of right humerus, initial encounter for closed fracture: Secondary | ICD-10-CM | POA: Diagnosis not present

## 2020-07-01 DIAGNOSIS — M25611 Stiffness of right shoulder, not elsewhere classified: Secondary | ICD-10-CM | POA: Diagnosis not present

## 2020-07-01 DIAGNOSIS — M25511 Pain in right shoulder: Secondary | ICD-10-CM | POA: Insufficient documentation

## 2020-07-01 NOTE — Therapy (Signed)
[address]. [place], [place], [address] Phone: [phone]   Fax:  [phone]  Physical Therapy Treatment/Discharge Note  Patient Details  Name: Sophia Ward MRN: [id number] Date of Birth: 1981/09/30 Referring Provider (PT): [place]  Visits from Start of Care: 9   Plan: Patient agrees to discharge.  Patient goals were not met. Patient is being discharged due to the patient's request.  ????? Pt moving to [facility]       Encounter Date: 07/01/2020   PT End of Session - 07/01/20 1453    Visit Number 9    Date for PT Re-Evaluation 08/02/20    PT Start Time 1410    PT Stop Time 1448    PT Time Calculation (min) 38 min    Activity Tolerance Patient tolerated treatment well;Patient limited by pain    Behavior During Therapy [facility] for tasks assessed/performed           Past Medical History:  Diagnosis Date  . Allergic rhinitis   . Anemia 09/2019  . Arthritis    hands  . BPPV (benign paroxysmal positional vertigo)   . Coronary artery disease   . Diabetes mellitus without complication ([place])   . Essential hypertension, benign 01/30/2014  . GERD (gastroesophageal reflux disease)   . Hypertension   . Lesion of pancreas   . Mixed hyperlipidemia 01/30/2014  . Myocardial infarction ([place]) 2004   mild MI-no damage- had stents  . Osteopenia   . Post-menopausal   . Pulmonary nodules   . Rheumatoid arthritis ([place])   . S/P TAVR (transcatheter aortic valve replacement) 03/03/2020   s/p TAVR with a 23 mm Edwards S3U via the TF approach by Drs Burt Knack and Roxy Manns  . Severe aortic stenosis   . Spinal stenosis   . Stenosing tenosynovitis     Past Surgical History:  Procedure Laterality Date  . ABDOMINAL HYSTERECTOMY    . APPENDECTOMY    . BIOPSY  10/25/2019   Procedure: BIOPSY;  Surgeon: Otis Brace, MD;  Location: [place];  Service: Gastroenterology;;  . BIOPSY   10/26/2019   Procedure: BIOPSY;  Surgeon: Otis Brace, MD;  Location: [place] [facility];  Service: Gastroenterology;;  . CARDIAC CATHETERIZATION  2004  . carpel tunnel    . COLONOSCOPY WITH PROPOFOL N/A 10/30/2012   Procedure: COLONOSCOPY WITH PROPOFOL;  Surgeon: Garlan Fair, MD;  [facility];  Service: Endoscopy;  Laterality: N/A;  . COLONOSCOPY WITH PROPOFOL N/A 10/26/2019   Procedure: COLONOSCOPY WITH PROPOFOL;  Surgeon: Otis Brace, MD;  Location: [place];  Service: Gastroenterology;  Laterality: N/A;  . CORONARY ANGIOPLASTY  2004  . CORONARY ATHERECTOMY N/A 02/21/2020   Procedure: CORONARY ATHERECTOMY;  Surgeon: Jettie Booze, MD;  Location: [place] CV LAB;  Service: Cardiovascular;  Laterality: N/A;  . CORONARY STENT INTERVENTION N/A 02/21/2020   Procedure: CORONARY STENT INTERVENTION;  Surgeon: Jettie Booze, MD;  Location: [place] CV LAB;  Service: Cardiovascular;  Laterality: N/A;  . DILATION AND CURETTAGE OF UTERUS    . ESOPHAGOGASTRODUODENOSCOPY N/A 10/25/2019   Procedure: ESOPHAGOGASTRODUODENOSCOPY (EGD);  Surgeon: Otis Brace, MD;  Location: [facility];  Service: Gastroenterology;  Laterality: N/A;  . ESOPHAGOGASTRODUODENOSCOPY (EGD) WITH PROPOFOL N/A 10/30/2012   Procedure: ESOPHAGOGASTRODUODENOSCOPY (EGD) WITH PROPOFOL;  Surgeon: Garlan Fair, MD;  [facility];  Service: Endoscopy;  Laterality: N/A;  . EYE SURGERY     bilateral cataract with lens implant  . EYE  SURGERY    . INJECTION OF SILICONE OIL Left 11/27/4101   Procedure: Injection Of Silicone Oil;  Surgeon: Jalene Mullet, MD;  Location: [place];  Service: Ophthalmology;  Laterality: Left;  . INTRAVASCULAR PRESSURE WIRE/FFR STUDY N/A 01/29/2020   Procedure: INTRAVASCULAR PRESSURE WIRE/FFR STUDY;  Surgeon: Jettie Booze, MD;  Location: [place] [facility];  Service: Cardiovascular;  Laterality: N/A;  . INTRAVASCULAR ULTRASOUND/IVUS N/A 02/21/2020    Procedure: Intravascular Ultrasound/IVUS;  Surgeon: Jettie Booze, MD;  Location: [place] CV LAB;  Service: Cardiovascular;  Laterality: N/A;  . left shouler surgery    . PHOTOCOAGULATION WITH LASER Left 02/12/2019   Procedure: Photocoagulation With Laser;  Surgeon: Jalene Mullet, MD;  Location: [place];  Service: Ophthalmology;  Laterality: Left;  . 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 RIGHT/LEFT HEART CATH AND CORONARY ANGIOGRAPHY N/A 01/29/2020   Procedure: RIGHT/LEFT HEART CATH AND CORONARY ANGIOGRAPHY;  Surgeon: Jettie Booze, MD;  Location: [place] CV LAB;  Service: Cardiovascular;  Laterality: N/A;  . TEE WITHOUT CARDIOVERSION N/A 03/03/2020   Procedure: TRANSESOPHAGEAL ECHOCARDIOGRAM (TEE);  Surgeon: Sherren Mocha, MD;  Location: [place];  Service: Open Heart Surgery;  Laterality: N/A;  . TONSILLECTOMY    . TRANSCATHETER AORTIC VALVE REPLACEMENT, TRANSFEMORAL  03/03/2020  . TRANSCATHETER AORTIC VALVE REPLACEMENT, TRANSFEMORAL N/A 03/03/2020   Procedure: TRANSCATHETER AORTIC VALVE REPLACEMENT, TRANSFEMORAL USING EDWARDS SAPIEN 3 23 MM AORTIC VALVE.;  Surgeon: Sherren Mocha, MD;  Location: [place];  Service: Open Heart Surgery;  Laterality: N/A;  . VITRECTOMY 25 GAUGE WITH SCLERAL BUCKLE Left 02/12/2019   Procedure: Vitrectomy 25 Gauge With Scleral Buckle;  Surgeon: Jalene Mullet, MD;  Location: [place];  Service: Ophthalmology;  Laterality: Left;    There were no vitals filed for this visit.   Subjective Assessment - 07/01/20 1411    Subjective Pt went to Dr Tollie Pizza office this morning and says repeat xrays done, arm looks like it is healing well and overall good progress. Pt reports no restrictions given today at her appointment and a new script to continue PT in [place] was given to her  today.    Pertinent History RIGHT proximal humerus fx 04/18/20    Currently in Pain? No/denies    Pain Score 0-No pain    Pain Location Shoulder    Pain Orientation Right              [facility] PT Assessment - 07/01/20 0001      AROM   Right Shoulder Flexion 125 Degrees    Right Shoulder ABduction 95 Degrees   tight at end range     PROM   Right Shoulder Flexion 135 Degrees    Right Shoulder ABduction 105 Degrees      Strength   Right/Left Shoulder Right    Right Shoulder Flexion 3+/5    Right Shoulder ABduction 3+/5    Right Shoulder Internal Rotation 3+/5    Right Shoulder External Rotation 3+/5    Right Elbow Flexion 5/5                         [facility] Adult PT Treatment/Exercise - 07/01/20 0001      Shoulder Exercises: Supine   External Rotation Strengthening;Right;5 reps    Theraband Level (Shoulder External Rotation) Level 1 (Yellow)  Flexion AROM;10 reps    Other Supine Exercises Semireclined chest press + shoulder flexion  10 x 2 c 2# dowel.  ER+ ABD with hands behind head stretch 30 sec x 3      Shoulder Exercises: Seated   Retraction Limitations Seated scap retractions  tactile & verbal cues provided 10 x 2 with 3" holds    Flexion Limitations seated AA/AROM shoulder flexion x 10    ABduction Limitations seated P/AAROM right shoulder abduction x10 with cane  within available pain free range      Shoulder Exercises: Standing   Other Standing Exercises Finger walks flexion and abduction 5 x 4 each    Other Standing Exercises Standing ER with ABD using strpa or towel 30 sec x 2      Shoulder Exercises: Pulleys   Flexion 3 minutes    Scaption Limitations 3 minutes      Shoulder Exercises: ROM/Strengthening   UBE (Upper Arm Bike) lvl 1 4 min fwd, 2 min bwd      Shoulder Exercises: Stretch   Other Shoulder Stretches Seated ER stretch with forearm on table                  PT Education - 07/01/20 1451    Education Details Reviewed  Discharge plan to discharge to HEP at this time with plan to continue PT upon return to [facility]. Discharge HEP reviewed again with addition of supine ER/ABD stretch and discussed potentially obtianing over door pulleys to continue to work on shoulder ROM at home.    Person(s) Educated Patient    Methods Explanation;Demonstration;Handout;Verbal cues    Comprehension Verbalized understanding;Returned demonstration            PT Short Term Goals - 06/18/20 1323      PT SHORT TERM GOAL #1   Title Pt will be I with initial HEP    Time 2    Period Weeks    Status Achieved    Target Date 06/18/20             PT Long Term Goals - 07/01/20 1434      PT LONG TERM GOAL #1   Title Pt will demo R shoulder flexion to 160 deg    Time --    Period --    Status Not Met   135 passive, 125 active     PT LONG TERM GOAL #2   Title Pt will demo R shoulder abduction equivalent to LUE    Status Not Met   105 R abd, 130 L abd     PT LONG TERM GOAL #3   Title Pt will report able to perform ADLs with R shoulder with </= 1/10 R shoulder pain    Status Not Met   Continued tightness and weakness into ER with ABD limiting ADLs     PT LONG TERM GOAL #4   Title Pt will demo R shoulder MMT equivalent to L shoulder    Status Not Met   Improved strength against gravity in available range with minimal resistance on the right.                Plan - 07/01/20 1455    Clinical Impression Statement Pt is a kind 87 yo female who has been attending skilled physical therapy post R proximal humerus fracture sustained 04/18/2020. She has made gradual progress towards goals, improving since initial eval but with continue deficits in shoulder flexion, and abduction. She currently has  most difficulty with ADLs requiring abduction with ER (hair grooming, etc). Her strength within available range continues to improve gradually but with limited endurance. She reports not feeling as limited by pain but more so by  feelings of tightness.  Given move to [place], she is to be discharged from skilled physical therapy at this clinic at this time with updated HEP provided, and plan to continue skilled physical therapy in [place]. Pt is overall motivated to continue to improve and will benefit from continued skilled physical therapy to work on improving functional mobility, strength, and ADLs.    Personal Factors and Comorbidities Comorbidity 3+    Comorbidities RA, HTN, DM    Examination-Activity Limitations Carry;Lift;Reach Overhead    Examination-Participation Restrictions [place]           Patient will benefit from skilled therapeutic intervention in order to improve the following deficits and impairments:  Decreased range of motion,Increased muscle spasms,Impaired UE functional use,Pain,Hypomobility,Impaired flexibility,Decreased strength,Postural dysfunction  Visit Diagnosis: Acute pain of right shoulder  Stiffness of right shoulder, not elsewhere classified  Cramp and spasm  Localized edema     Problem List Patient Active Problem List   Diagnosis Date Noted  . Acute on chronic diastolic heart failure ([place]) 03/04/2020  . S/P TAVR (transcatheter aortic valve replacement) 03/03/2020  . Diabetes mellitus without complication ([place])   . Pulmonary nodules   . Lesion of pancreas   . Severe aortic stenosis   . Pressure injury of skin 01/15/2020  . Iron deficiency anemia 01/06/2020  . Symptomatic anemia 10/23/2019  . Anemia 10/22/2019  . Polymyalgia rheumatica ([place]) 11/25/2016  . Primary osteoarthritis of both hands 11/25/2016  . Bilateral primary osteoarthritis of knee 11/25/2016  . Osteopenia of multiple sites 11/25/2016  . 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 Osteoarthritis of lumbar spine 10/28/2016  . History of gastroesophageal reflux (GERD) 10/27/2016  . Rheumatoid arthritis involving multiple sites with positive rheumatoid factor ([place]) 10/14/2016  . Coronary  atherosclerosis of native coronary artery 01/30/2014  . Mixed hyperlipidemia 01/30/2014  . Essential hypertension, benign 01/30/2014  . Obesity, unspecified 01/30/2014    Hall Busing, PT, DPT 07/01/2020, 3:11 PM  [place]. [place], [place], [address] Phone: [phone]   Fax:  [phone]  Name: LIEL RUDDEN MRN: [id number] Date of Birth: [birth year]

## 2020-07-09 ENCOUNTER — Encounter: Payer: Self-pay | Admitting: Rheumatology

## 2020-07-09 DIAGNOSIS — Z79899 Other long term (current) drug therapy: Secondary | ICD-10-CM | POA: Diagnosis not present

## 2020-07-10 ENCOUNTER — Telehealth: Payer: Self-pay | Admitting: *Deleted

## 2020-07-10 NOTE — Telephone Encounter (Signed)
Labs received from: [facility] on: 07/09/2020 Reviewed by: Hazel Sams, PA-C  Labs drawn:CBC/CMP  Results: Creatinine 0.92     GFR 57     WBC 12.4     RBC 3.58     Hgb 11.0     Hct 33.1     RDW 15.1    Absolute Neutrophils 9312    Absolute monocytes 1401  Previous Creatinine 1.06  Patient is on Prednisone 4 mg daily and MTX 5 tabs weekly.

## 2020-07-20 DIAGNOSIS — B9620 Unspecified Escherichia coli [E. coli] as the cause of diseases classified elsewhere: Secondary | ICD-10-CM | POA: Diagnosis not present

## 2020-07-20 DIAGNOSIS — N390 Urinary tract infection, site not specified: Secondary | ICD-10-CM | POA: Diagnosis not present

## 2020-07-20 IMAGING — DX DG CHEST 1V PORT
1 series · 1 of 1 positions shown · non-contrast
Comparison: 11/07/2016

CLINICAL DATA: Low hemoglobin levels. Cough.

EXAM:
PORTABLE CHEST 1 VIEW

[chest ap]
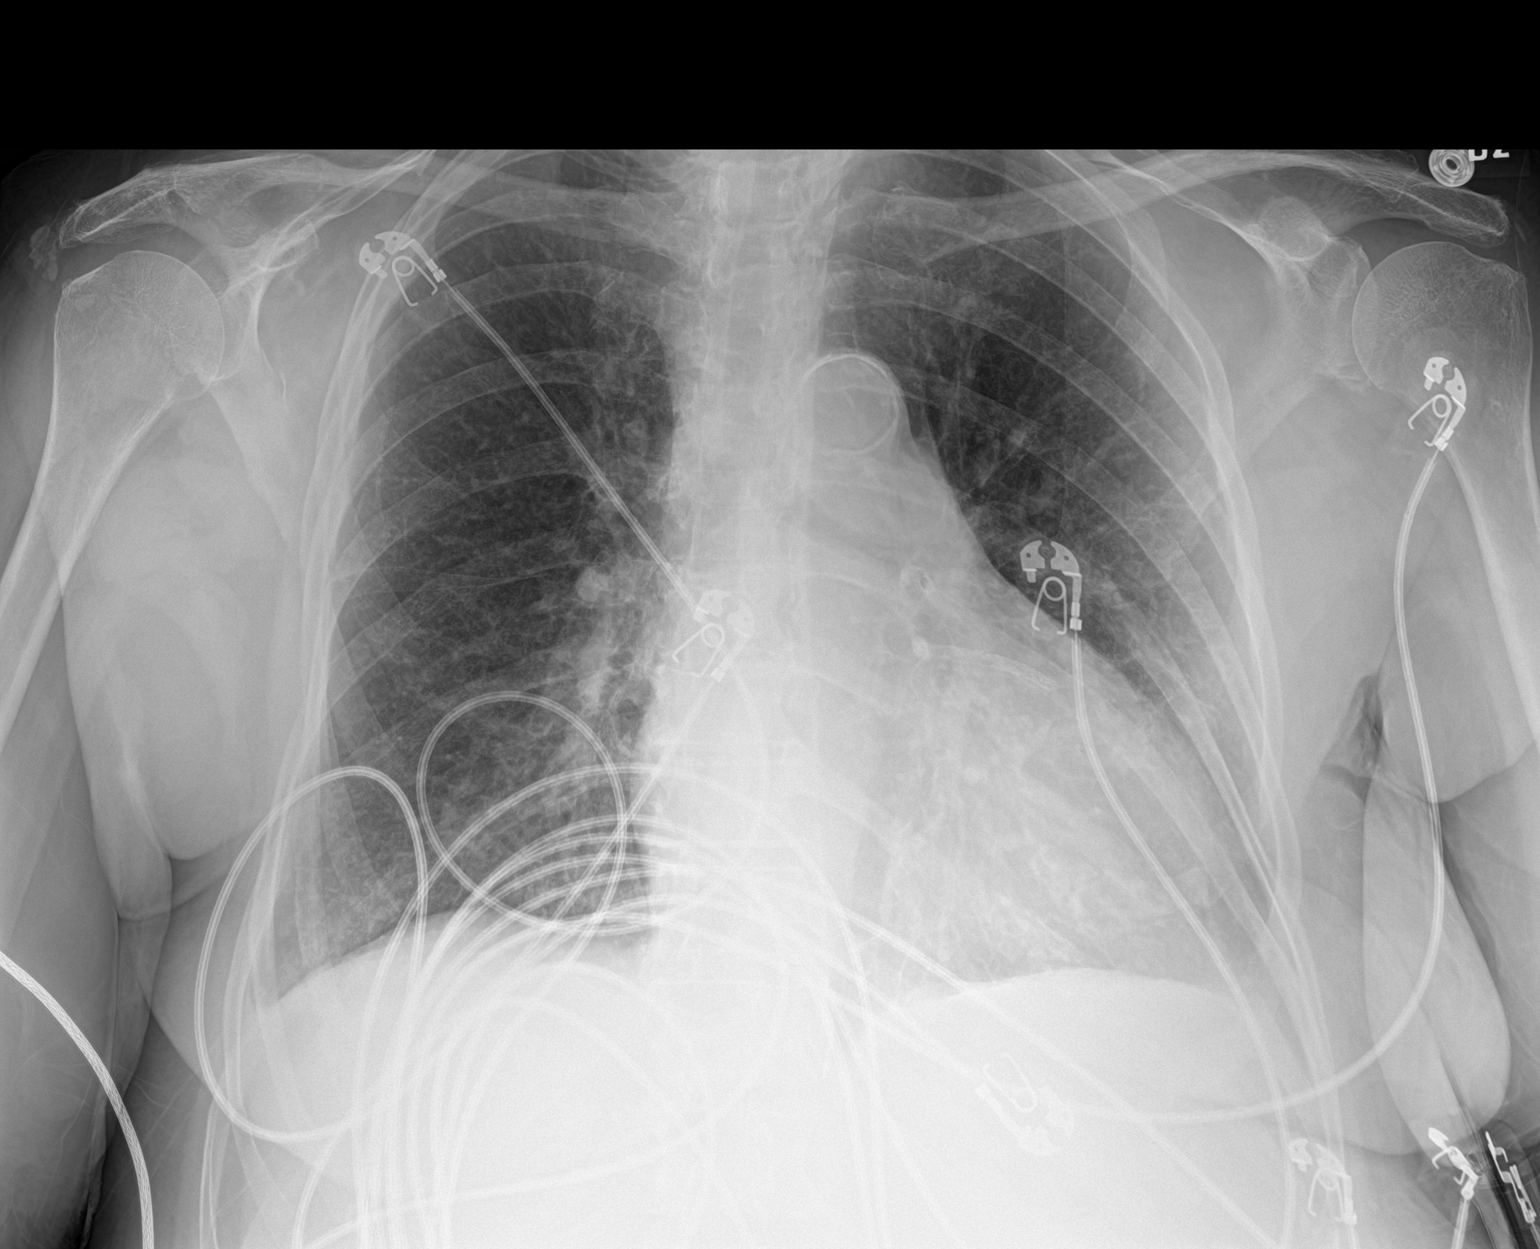

[1 of 1 positions shown; findings below may reference images not displayed]

FINDINGS: Aortic atherosclerosis. Moderate cardiac enlargement. No pleural
effusion or edema identified. No airspace densities. The visualized
osseous structures appear intact.
IMPRESSION: No acute cardiopulmonary abnormalities.

Aortic Atherosclerosis ([id number]).

## 2020-07-24 IMAGING — DX DG CHEST 1V PORT
1 series · 1 of 1 positions shown · non-contrast
Comparison: 10/23/2019

CLINICAL DATA: Cough.

EXAM:
PORTABLE CHEST 1 VIEW

[chest ap]
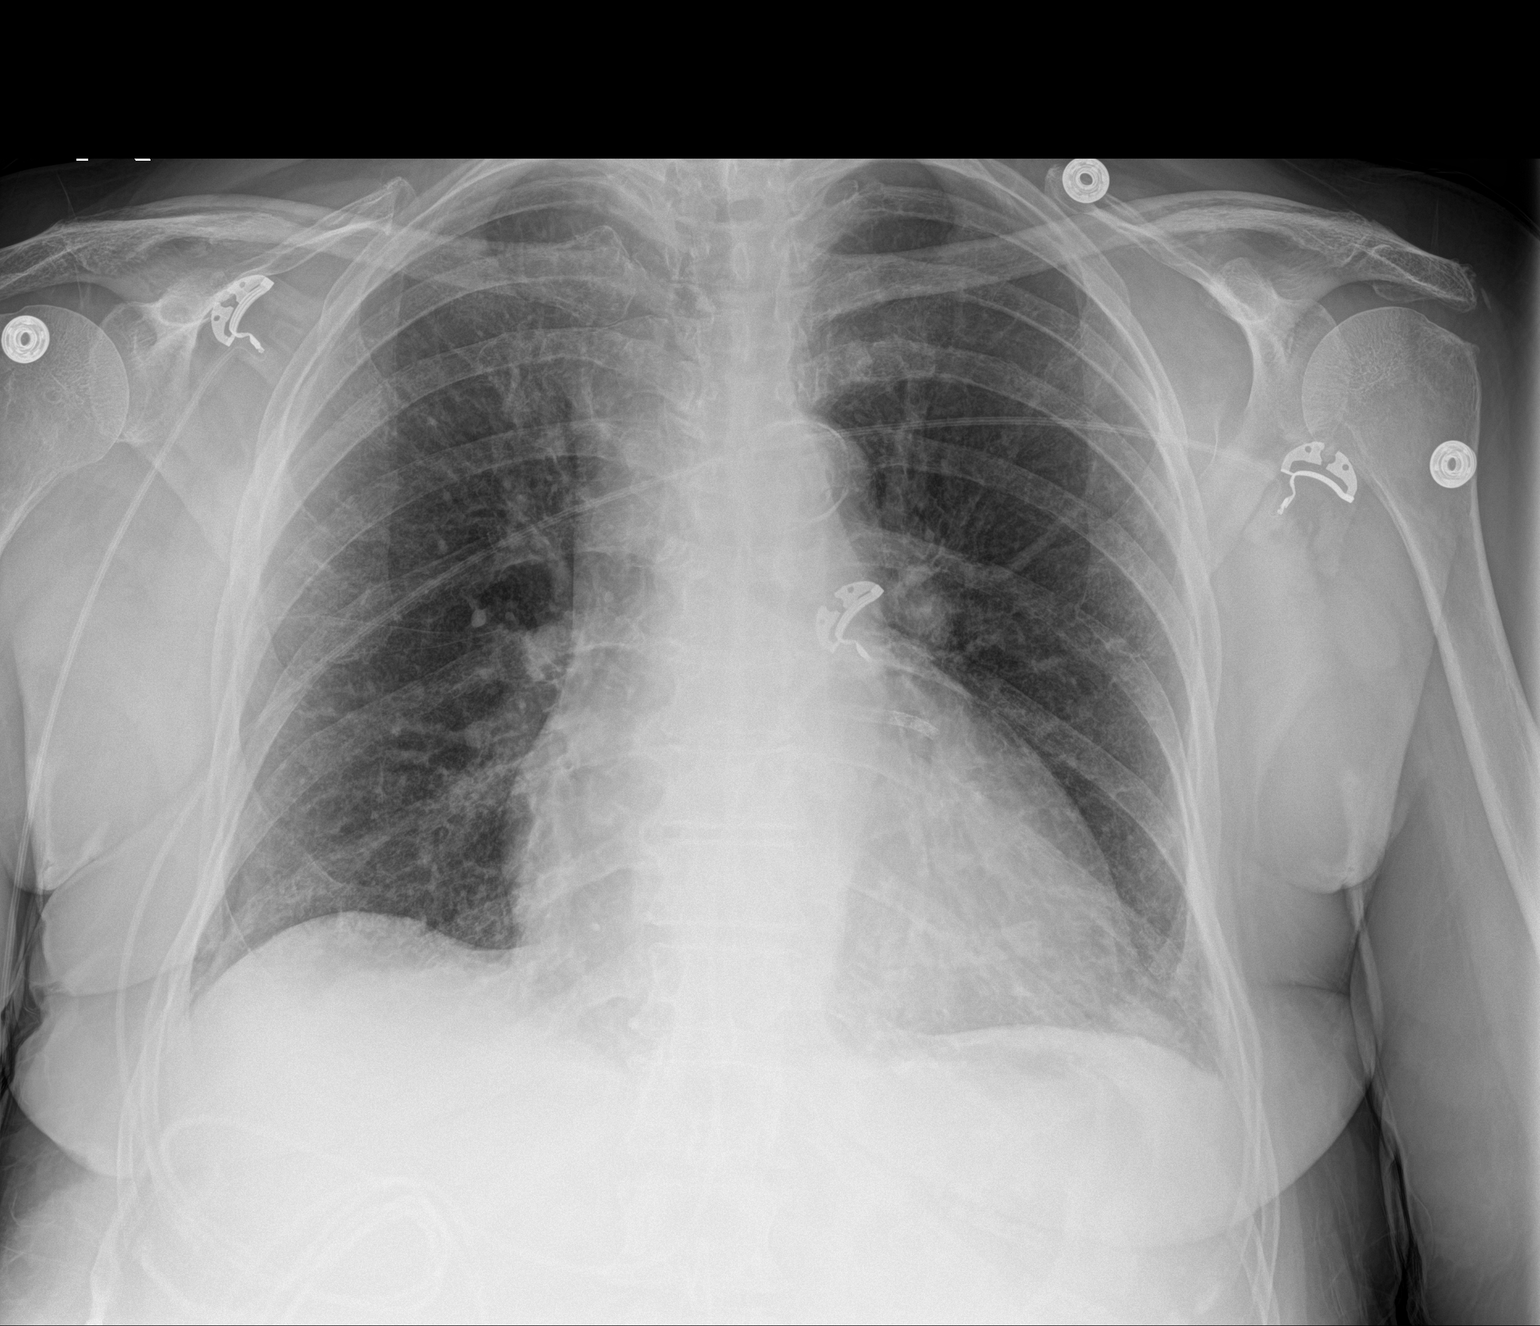

[1 of 1 positions shown; findings below may reference images not displayed]

FINDINGS: Stable mild cardiomegaly. Aortic atherosclerosis noted. Both lungs
are clear.
IMPRESSION: Stable mild cardiomegaly. No active lung disease.

## 2020-07-27 ENCOUNTER — Other Ambulatory Visit: Payer: Self-pay

## 2020-07-27 DIAGNOSIS — Z7901 Long term (current) use of anticoagulants: Secondary | ICD-10-CM | POA: Diagnosis not present

## 2020-07-27 DIAGNOSIS — M5416 Radiculopathy, lumbar region: Secondary | ICD-10-CM | POA: Diagnosis not present

## 2020-07-27 DIAGNOSIS — M5380 Other specified dorsopathies, site unspecified: Secondary | ICD-10-CM | POA: Diagnosis not present

## 2020-07-27 DIAGNOSIS — M5136 Other intervertebral disc degeneration, lumbar region: Secondary | ICD-10-CM | POA: Diagnosis not present

## 2020-07-27 DIAGNOSIS — M48061 Spinal stenosis, lumbar region without neurogenic claudication: Secondary | ICD-10-CM | POA: Diagnosis not present

## 2020-07-27 DIAGNOSIS — M5126 Other intervertebral disc displacement, lumbar region: Secondary | ICD-10-CM | POA: Diagnosis not present

## 2020-07-27 DIAGNOSIS — M4726 Other spondylosis with radiculopathy, lumbar region: Secondary | ICD-10-CM | POA: Diagnosis not present

## 2020-07-27 MED ORDER — METHOTREXATE SODIUM 2.5 MG PO TABS: ORAL_TABLET | ORAL | 0 refills | Status: DC

## 2020-07-27 NOTE — Telephone Encounter (Signed)
Last Visit: 05/06/2020 Next Visit: 10/16/2020 Labs:  07/10/2020, Creatinine 0.92                GFR 57                WBC 12.4                RBC 3.58                Hgb 11.0                Hct 33.1                RDW 15.1               Absolute Neutrophils 9312               Absolute monocytes 1401  Previous Creatinine 1.06   Current Dose per office note 05/06/2020, Methotrexate 2.5 mg 5 tablet every 7 days  DX: Rheumatoid arthritis involving multiple sites with positive rheumatoid factor  Last Fill: 05/11/2020  Okay to refill MX?

## 2020-07-27 NOTE — Telephone Encounter (Signed)
Patient called requesting prescription refill of Methotrexate to be sent to [facility] at [address] [place], [place]  Their phone [phone]

## 2020-07-28 ENCOUNTER — Telehealth: Payer: Self-pay | Admitting: *Deleted

## 2020-07-28 NOTE — Telephone Encounter (Signed)
   [facility] Pre-operative Risk Assessment    HEARTCARE STAFF: - Please ensure there is not already an duplicate clearance open for this procedure. - Under Visit Info/Reason for Call, type in Other and utilize the format Clearance MM/DD/YY or Clearance TBD. Do not use dashes or single digits. - If request is for dental extraction, please clarify the # of teeth to be extracted.  Request for surgical clearance:  1. What type of surgery is being performed? THERAPEUTIC PAIN MANAGEMENT INTERVENTIONAL INJECTION (SPINAL)  2. When is this surgery scheduled? TBD   3. What type of clearance is required (medical clearance vs. Pharmacy clearance to hold med vs. Both)? MEDICAL  4. Are there any medications that need to be held prior to surgery and how long? PLAVIX x 5 DAYS; ON REQUEST FORM IF THE PT HAS RENAL IMPAIRMENT THEN HOLD WILL NEED TO HOLD PLAVIX x 6 DAYS  5. Practice name and name of physician performing surgery? [facility]; DR. Annie Main FRIEDMAN  6. What is the office phone number? [phone]   7.   What is the office fax number? [phone]  8.   Anesthesia type (None, local, MAC, general) ? NOT LISTED   [name] 07/28/2020, 8:14 AM  _________________________________________________________________   (provider comments below)

## 2020-07-30 NOTE — Telephone Encounter (Signed)
   Primary Cardiologist: Larae Grooms, MD  Chart reviewed as part of pre-operative protocol coverage. Spoke with the patient. She had spinal injection done last week and she held Xarelto for 3 days and Plavix for 5 days. She did well.   She will discuss with long term planning with Dr. Irish Lack during office visit 11/04/2020. She gets spinal infection every 4 months.   I will route this recommendation to the requesting party via Epic fax function and remove from pre-op pool.  Please call with questions.  [employer], [place] 07/30/2020, 10:58 AM

## 2020-07-31 DIAGNOSIS — S42201D Unspecified fracture of upper end of right humerus, subsequent encounter for fracture with routine healing: Secondary | ICD-10-CM | POA: Diagnosis not present

## 2020-08-03 DIAGNOSIS — S42201D Unspecified fracture of upper end of right humerus, subsequent encounter for fracture with routine healing: Secondary | ICD-10-CM | POA: Diagnosis not present

## 2020-08-03 NOTE — Telephone Encounter (Signed)
[facility]; DR. Cindy Hazy called in and stated they need the ok to hold the Plavix for 7 day and Eliquis  for 3 day prior to her procedure .     Best number [phone] Pod  Refax back to [phone]

## 2020-08-03 NOTE — Telephone Encounter (Signed)
Sophia Ward 87 year old female received approval for holding Xarelto for 3 days with Plavix for 5 days, prior to spinal injection.  Office now requesting a 7-day Plavix hold due to renal impairment.  Please advise.  Thank you for your help.  Please direct response to CV DIV preop pool.  Jossie Ng. Rossana Molchan NP-C    08/03/2020, 1:19 PM [facility] [address] Office [phone] Fax [phone]

## 2020-08-03 NOTE — Telephone Encounter (Signed)
Given that she had a high risk left main / LAD PCI, we have to wait at least 6 months after that PCI before we consider holding Plavix.  That would put the injection at the earliest in April.  JV

## 2020-08-04 NOTE — Telephone Encounter (Signed)
Patient with diagnosis of afib on Eliquis for anticoagulation.    Procedure: THERAPEUTIC PAIN MANAGEMENT INTERVENTIONAL INJECTION (SPINAL) Date of procedure: TBD  CHA2DS2-VASc Score = 7  This indicates a 11.2% annual risk of stroke. The patient's score is based upon: CHF History: Yes HTN History: Yes Diabetes History: Yes Stroke History: No Vascular Disease History: Yes Age Score: 2 Gender Score: 1      CrCl 36 ml/min  Patient has a CHA2DS2-VASc score of 7. Per protocol will defer to MD. Would recommend 3 days if Dr. Irish Lack is ok with.  Please also note that Xarelto is on her med list- but I believe she is on Eliquis. Please confirm and fix

## 2020-08-05 NOTE — Telephone Encounter (Signed)
The patient is able to complete METS (can climb stairs, grocery shops, moderate house work) without angina.   Await Dr. Irish Lack for final recommendations on eliquis hold and how long plavix can be held.   Preop callback - I was unable to change her med list - can you adjust to reflect that she is taking eliquis and not xarelto?

## 2020-08-07 NOTE — Addendum Note (Signed)
Addended by: Marcelle Overlie D on: 08/07/2020 11:48 AM   Modules accepted: Orders

## 2020-08-07 NOTE — Telephone Encounter (Signed)
Dr.Varansai can you comment on if pt can hold Eliquis 3 days once she is 6 months out from PCI? She has a CHA2DS2-VASc score of 7. No stroke

## 2020-08-10 DIAGNOSIS — B9620 Unspecified Escherichia coli [E. coli] as the cause of diseases classified elsewhere: Secondary | ICD-10-CM | POA: Diagnosis not present

## 2020-08-10 DIAGNOSIS — N390 Urinary tract infection, site not specified: Secondary | ICD-10-CM | POA: Diagnosis not present

## 2020-08-10 NOTE — Telephone Encounter (Signed)
OK to hold Eliquis 3 days prior to procedure once she has completed 6 months post PCI.   JV

## 2020-08-10 NOTE — Telephone Encounter (Signed)
   Primary Cardiologist: Larae Grooms, MD  Chart reviewed and patient contacted today by phone as part of pre-operative protocol coverage. Given past medical history and time since last visit, based on ACC/AHA guidelines, Sophia Ward would be at acceptable risk for the planned procedure without further cardiovascular testing.   Dr Irish Lack recommends she wait until after April 5th to have her procedure done, that would put her > 6 months out after high PCI with stent.  At that point OK to hold Plavix 6 days and Eliquis 3 days pre op- resume both as soon as possible post op.   The patient was advised that if she develops new symptoms prior to surgery to contact our office to arrange for a follow-up visit, and she verbalized understanding.  I will route this recommendation to the requesting party via Epic fax function and remove from pre-op pool.  Please call with questions.  Kerin Ransom, PA-C 08/10/2020, 2:29 PM

## 2020-08-11 DIAGNOSIS — S42201D Unspecified fracture of upper end of right humerus, subsequent encounter for fracture with routine healing: Secondary | ICD-10-CM | POA: Diagnosis not present

## 2020-08-13 DIAGNOSIS — S42201D Unspecified fracture of upper end of right humerus, subsequent encounter for fracture with routine healing: Secondary | ICD-10-CM | POA: Diagnosis not present

## 2020-08-18 DIAGNOSIS — S42201D Unspecified fracture of upper end of right humerus, subsequent encounter for fracture with routine healing: Secondary | ICD-10-CM | POA: Diagnosis not present

## 2020-08-19 NOTE — Telephone Encounter (Signed)
    Allen with Dr. Idamae Schuller office calling back, they received clearance but they are requesting to hold Plavix 7 days and Eliquis 3 days prior procedure. The pt is scheduled to get procedure on 10/15/20.

## 2020-08-19 NOTE — Telephone Encounter (Signed)
   Primary Cardiologist: Larae Grooms, MD  Chart reviewed as part of pre-operative protocol coverage. Given past medical history and time since last visit, based on ACC/AHA guidelines, Sophia Ward would be at acceptable risk for the planned procedure without further cardiovascular testing.   Dr Irish Lack recommends she wait until after April 5th to have her procedure done, that would put her > 6 months out after high PCI with stent.  At that point OK to hold Plavix 5-7 days and Eliquis 3 days pre op- resume both as soon as possible post op.   Patient was advised that if she develops new symptoms prior to surgery to contact our office to arrange a follow-up appointment.  He verbalized understanding.  I will route this recommendation to the requesting party via Epic fax function and remove from pre-op pool.  Please call with questions.  Jossie Ng. Sophia Buege NP-C    08/19/2020, 8:46 AM [place] [address] Office [phone] Fax [phone]

## 2020-08-20 DIAGNOSIS — S42201D Unspecified fracture of upper end of right humerus, subsequent encounter for fracture with routine healing: Secondary | ICD-10-CM | POA: Diagnosis not present

## 2020-08-21 ENCOUNTER — Telehealth: Payer: Self-pay | Admitting: Hematology

## 2020-08-21 NOTE — Telephone Encounter (Signed)
R/s 3/29 appt per patient request from sch msg. Called and spoke with patient. Confirmed new date and time. Pushed out a couple of months due to being in [place]

## 2020-08-25 ENCOUNTER — Inpatient Hospital Stay: Payer: Medicare Other

## 2020-08-25 ENCOUNTER — Inpatient Hospital Stay: Payer: Medicare Other | Admitting: Hematology

## 2020-08-25 DIAGNOSIS — R3914 Feeling of incomplete bladder emptying: Secondary | ICD-10-CM | POA: Diagnosis not present

## 2020-08-25 DIAGNOSIS — N952 Postmenopausal atrophic vaginitis: Secondary | ICD-10-CM | POA: Diagnosis not present

## 2020-08-25 DIAGNOSIS — N390 Urinary tract infection, site not specified: Secondary | ICD-10-CM | POA: Diagnosis not present

## 2020-08-25 DIAGNOSIS — R3121 Asymptomatic microscopic hematuria: Secondary | ICD-10-CM | POA: Diagnosis not present

## 2020-08-26 DIAGNOSIS — S42201D Unspecified fracture of upper end of right humerus, subsequent encounter for fracture with routine healing: Secondary | ICD-10-CM | POA: Diagnosis not present

## 2020-08-31 DIAGNOSIS — S42201D Unspecified fracture of upper end of right humerus, subsequent encounter for fracture with routine healing: Secondary | ICD-10-CM | POA: Diagnosis not present

## 2020-09-01 DIAGNOSIS — N3020 Other chronic cystitis without hematuria: Secondary | ICD-10-CM | POA: Diagnosis not present

## 2020-09-01 DIAGNOSIS — R3121 Asymptomatic microscopic hematuria: Secondary | ICD-10-CM | POA: Diagnosis not present

## 2020-09-01 DIAGNOSIS — D494 Neoplasm of unspecified behavior of bladder: Secondary | ICD-10-CM | POA: Diagnosis not present

## 2020-09-05 DIAGNOSIS — R051 Acute cough: Secondary | ICD-10-CM | POA: Diagnosis not present

## 2020-09-05 DIAGNOSIS — U071 COVID-19: Secondary | ICD-10-CM | POA: Diagnosis not present

## 2020-09-05 DIAGNOSIS — J1282 Pneumonia due to coronavirus disease 2019: Secondary | ICD-10-CM | POA: Diagnosis not present

## 2020-09-05 DIAGNOSIS — R059 Cough, unspecified: Secondary | ICD-10-CM | POA: Diagnosis not present

## 2020-09-05 DIAGNOSIS — R918 Other nonspecific abnormal finding of lung field: Secondary | ICD-10-CM | POA: Diagnosis not present

## 2020-09-19 DIAGNOSIS — R059 Cough, unspecified: Secondary | ICD-10-CM | POA: Diagnosis not present

## 2020-09-19 DIAGNOSIS — F419 Anxiety disorder, unspecified: Secondary | ICD-10-CM | POA: Diagnosis not present

## 2020-10-02 NOTE — Progress Notes (Signed)
Office Visit Note  Patient: Sophia Ward             Date of Birth: 09-21-1932           MRN: [id number]             PCP: [name], MD Referring: [name], MD Visit Date: 10/16/2020 Occupation: @GUAROCC @  Subjective:  Medication monitoring.   History of Present Illness: Sophia Ward is a 87 y.o. female with a history of rheumatoid arthritis and osteoarthritis.  She has been having off-and-on stiffness in her hands but no increased joint swelling.  She has been taking methotrexate 5 tablets p.o. weekly along with folic acid.  She is still on prednisone 4 mg p.o. daily for polymyalgia rheumatica.  She is unable to taper prednisone.  She lost her husband in April 2022 and has been going through some stressful time.  She has some stiffness in her knees but no increased swelling.  Activities of Daily Living:  Patient reports morning stiffness for 30 minutes.   Patient Denies nocturnal pain.  Difficulty dressing/grooming: Denies Difficulty climbing stairs: Denies Difficulty getting out of chair: Reports Difficulty using hands for taps, buttons, cutlery, and/or writing: Reports  Review of Systems  Constitutional: Positive for fatigue. Negative for night sweats, weight gain and weight loss.  HENT: Negative for mouth sores, trouble swallowing, trouble swallowing, mouth dryness and nose dryness.   Eyes: Negative for pain, redness, itching, visual disturbance and dryness.  Respiratory: Negative for cough, shortness of breath and difficulty breathing.   Cardiovascular: Negative for chest pain, palpitations, hypertension, irregular heartbeat and swelling in legs/feet.  Gastrointestinal: Negative for blood in stool, constipation and diarrhea.  Endocrine: Negative for increased urination.  Genitourinary: Negative for difficulty urinating and vaginal dryness.  Musculoskeletal: Positive for arthralgias, joint pain, myalgias, morning stiffness and myalgias. Negative for  joint swelling, muscle weakness and muscle tenderness.  Skin: Negative for color change, rash, hair loss, redness, skin tightness, ulcers and sensitivity to sunlight.  Allergic/Immunologic: Positive for susceptible to infections.  Neurological: Negative for dizziness, numbness, headaches, memory loss, night sweats and weakness.  Hematological: Positive for bruising/bleeding tendency. Negative for swollen glands.  Psychiatric/Behavioral: Negative for depressed mood, confusion and sleep disturbance. The patient is not nervous/anxious.     PMFS History:  Patient Active Problem List   Diagnosis Date Noted  . Acute on chronic diastolic heart failure ([place]) 03/04/2020  . S/P TAVR (transcatheter aortic valve replacement) 03/03/2020  . Diabetes mellitus without complication ([place])   . Pulmonary nodules   . Lesion of pancreas   . Severe aortic stenosis   . Pressure injury of skin 01/15/2020  . Iron deficiency anemia 01/06/2020  . Symptomatic anemia 10/23/2019  . Anemia 10/22/2019  . Polymyalgia rheumatica ([place]) 11/25/2016  . Primary osteoarthritis of both hands 11/25/2016  . Bilateral primary osteoarthritis of knee 11/25/2016  . Osteopenia of multiple sites 11/25/2016  . 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 Osteoarthritis of lumbar spine 10/28/2016  . History of gastroesophageal reflux (GERD) 10/27/2016  . Rheumatoid arthritis involving multiple sites with positive rheumatoid factor ([place]) 10/14/2016  . Coronary atherosclerosis of native coronary artery 01/30/2014  . Mixed hyperlipidemia 01/30/2014  . Essential hypertension, benign 01/30/2014  . Obesity, unspecified 01/30/2014    Past Medical History:  Diagnosis Date  . Allergic rhinitis   . Anemia 09/2019  . Arthritis    hands  . BPPV (benign paroxysmal positional vertigo)   . Coronary artery disease   .  Diabetes mellitus without complication ([place])   . Essential hypertension, benign 01/30/2014  . GERD  (gastroesophageal reflux disease)   . Hypertension   . Lesion of pancreas   . Mixed hyperlipidemia 01/30/2014  . Myocardial infarction ([place]) 2004   mild MI-no damage- had stents  . Osteopenia   . Post-menopausal   . Pulmonary nodules   . Rheumatoid arthritis ([place])   . S/P TAVR (transcatheter aortic valve replacement) 03/03/2020   s/p TAVR with a 23 mm Edwards S3U via the TF approach by Drs Burt Knack and Roxy Manns  . Severe aortic stenosis   . Spinal stenosis   . Stenosing tenosynovitis     Family History  Problem Relation Age of Onset  . CAD Mother   . Heart attack Mother   . CAD Father   . Heart Problems Brother        open heart surgery   . Arthritis Daughter        psoriatic arthritis    Past Surgical History:  Procedure Laterality Date  . ABDOMINAL HYSTERECTOMY    . APPENDECTOMY    . BIOPSY  10/25/2019   Procedure: BIOPSY;  Surgeon: Otis Brace, MD;  Location: [facility];  Service: Gastroenterology;;  . BIOPSY  10/26/2019   Procedure: BIOPSY;  Surgeon: Otis Brace, MD;  Location: [facility];  Service: Gastroenterology;;  . CARDIAC CATHETERIZATION  2004  . carpel tunnel    . COLONOSCOPY WITH PROPOFOL N/A 10/30/2012   Procedure: COLONOSCOPY WITH PROPOFOL;  Surgeon: Garlan Fair, MD;  Location: [facility];  Service: Endoscopy;  Laterality: N/A;  . COLONOSCOPY WITH PROPOFOL N/A 10/26/2019   Procedure: COLONOSCOPY WITH PROPOFOL;  Surgeon: Otis Brace, MD;  Location: [facility];  Service: Gastroenterology;  Laterality: N/A;  . CORONARY ANGIOPLASTY  2004  . CORONARY ATHERECTOMY N/A 02/21/2020   Procedure: CORONARY ATHERECTOMY;  Surgeon: Jettie Booze, MD;  Location: [facility];  Service: Cardiovascular;  Laterality: N/A;  . CORONARY STENT INTERVENTION N/A 02/21/2020   Procedure: CORONARY STENT INTERVENTION;  Surgeon: Jettie Booze, MD;  Location: [facility];  Service: Cardiovascular;  Laterality: N/A;  . DILATION AND CURETTAGE OF  UTERUS    . ESOPHAGOGASTRODUODENOSCOPY N/A 10/25/2019   Procedure: ESOPHAGOGASTRODUODENOSCOPY (EGD);  Surgeon: Otis Brace, MD;  Location[facility];  Service: Gastroenterology;  Laterality: N/A;  . ESOPHAGOGASTRODUODENOSCOPY (EGD) WITH PROPOFOL N/A 10/30/2012   Procedure: ESOPHAGOGASTRODUODENOSCOPY (EGD) WITH PROPOFOL;  Surgeon: Garlan Fair, MD;  Location: [facility];  Service: Endoscopy;  Laterality: N/A;  . EYE SURGERY     bilateral cataract with lens implant  . EYE SURGERY    . INJECTION OF SILICONE OIL Left [id number]   Procedure: Injection Of Silicone Oil;  Surgeon: Jalene Mullet, MD;  Location: [facility];  Service: Ophthalmology;  Laterality: Left;  . INTRAVASCULAR PRESSURE WIRE/FFR STUDY N/A 01/29/2020   Procedure: INTRAVASCULAR PRESSURE WIRE/FFR STUDY;  Surgeon: Jettie Booze, MD;  Location: [facility];  Service: Cardiovascular;  Laterality: N/A;  . INTRAVASCULAR ULTRASOUND/IVUS N/A 02/21/2020   Procedure: Intravascular Ultrasound/IVUS;  Surgeon: Jettie Booze, MD;  Location: [facility];  Service: Cardiovascular;  Laterality: N/A;  . left shouler surgery    . PHOTOCOAGULATION WITH LASER Left 02/12/2019   Procedure: Photocoagulation With Laser;  Surgeon: Jalene Mullet, MD;  Location: [facility];  Service: Ophthalmology;  Laterality: Left;  . 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 RIGHT/LEFT HEART CATH AND CORONARY ANGIOGRAPHY N/A 01/29/2020   Procedure: RIGHT/LEFT HEART CATH AND CORONARY ANGIOGRAPHY;  Surgeon: Jettie Booze, MD;  Location: [facility];  Service: Cardiovascular;  Laterality: N/A;  . TEE WITHOUT CARDIOVERSION N/A 03/03/2020   Procedure: TRANSESOPHAGEAL ECHOCARDIOGRAM (TEE);  Surgeon: Sherren Mocha, MD;  Location: [facility];  Service: Open Heart Surgery;   Laterality: N/A;  . TONSILLECTOMY    . TRANSCATHETER AORTIC VALVE REPLACEMENT, TRANSFEMORAL  03/03/2020  . TRANSCATHETER AORTIC VALVE REPLACEMENT, TRANSFEMORAL N/A 03/03/2020   Procedure: TRANSCATHETER AORTIC VALVE REPLACEMENT, TRANSFEMORAL USING EDWARDS SAPIEN 3 23 MM AORTIC VALVE.;  Surgeon: Sherren Mocha, MD;  Location: [facility];  Service: Open Heart Surgery;  Laterality: N/A;  . VITRECTOMY 25 GAUGE WITH SCLERAL BUCKLE Left 02/12/2019   Procedure: Vitrectomy 25 Gauge With Scleral Buckle;  Surgeon: Jalene Mullet, MD;  Location: [facility];  Service: Ophthalmology;  Laterality: Left;   Social History   Social History Narrative  . Not on file   Immunization History  Administered Date(s) Administered  . Fluad Quad(high Dose 65+) 03/04/2020  . Influenza, High Dose Seasonal PF 03/09/2014, 04/09/2015, 03/16/2016, 02/25/2017, 03/20/2018  . PFIZER(Purple Top)SARS-COV-2 Vaccination 06/11/2019, 07/01/2019, 03/19/2020  . Zoster Recombinat (Shingrix) 02/07/2018, 06/04/2018     Objective: Vital Signs: BP 131/63 (BP Location: Left Arm, Patient Position: Sitting, Cuff Size: Normal)   Pulse 66   Resp 14   Ht 5' 2.5" (1.588 m)   Wt 165 lb (74.8 kg)   BMI 29.70 kg/m    Physical Exam Vitals and nursing note reviewed.  Constitutional:      Appearance: She is well-developed.  HENT:     Head: Normocephalic and atraumatic.  Eyes:     Conjunctiva/sclera: Conjunctivae normal.  Cardiovascular:     Rate and Rhythm: Normal rate and regular rhythm.     Heart sounds: Normal heart sounds.  Pulmonary:     Effort: Pulmonary effort is normal.     Breath sounds: Normal breath sounds.  Abdominal:     General: Bowel sounds are normal.     Palpations: Abdomen is soft.  Musculoskeletal:     Cervical back: Normal range of motion.  Lymphadenopathy:     Cervical: No cervical adenopathy.  Skin:    General: Skin is warm and dry.     Capillary Refill: Capillary refill takes less than 2 seconds.  Neurological:      Mental Status: She is alert and oriented to person, place, and time.  Psychiatric:        Behavior: Behavior normal.      Musculoskeletal Exam: C-spine was in good range of motion.  She had good range of motion of shoulder joints, elbow joints, wrist joints.  She had bilateral PIP and DIP thickening.  No synovitis was noted over MCP joints.  She had good range of motion of her hip joints and knee joints.  No warmth swelling or effusion was noted over knee joints.  She had no tenderness over ankles or MTPs.  CDAI Exam: CDAI Score: 0.4  Patient Global: 2 mm; Provider Global: 2 mm Swollen: 0 ; Tender: 0  Joint Exam 10/16/2020   No joint exam has been documented for this visit   There is currently no information documented on the homunculus. Go to the Rheumatology activity and complete the homunculus joint exam.  Investigation: No additional findings.  Imaging: No results found.  Recent Labs: Lab Results  Component Value Date   WBC 15.3 (H) 04/27/2020   HGB 11.3 (L) 04/27/2020   PLT 379 04/27/2020   NA 139 04/27/2020   K 3.9 04/27/2020   CL 104 04/27/2020   CO2 25 04/27/2020   GLUCOSE 182 (H) 04/27/2020   BUN 17 04/27/2020   CREATININE 1.06 (H) 04/27/2020   BILITOT 0.4 04/27/2020   ALKPHOS 50 04/27/2020   AST 16 04/27/2020   ALT 13 04/27/2020   PROT 7.3 04/27/2020   ALBUMIN 3.2 (L) 04/27/2020   CALCIUM 10.0 04/27/2020   GFRAA 60 03/11/2020    Speciality Comments: No specialty comments available.  Procedures:  No procedures performed Allergies: Codeine, Glimepiride, Jardiance [empagliflozin], Metformin and related, and Penicillins   Assessment / Plan:     Visit Diagnoses: Rheumatoid arthritis involving multiple sites with positive rheumatoid factor (HCC) - RF 309, ANA negative, CRP 5.8, synovitis.  She had no synovitis on examination.  She has some underlying discomfort due to osteoarthritis.    High risk medication use - Methotrexate 2.5 mg 5 tablet every 7  days ., folic acid 1 mg  daily, and prednisone 4 mg po daily.  Her last labs were in November 2021 which showed mild elevation of creatinine.  She was in [place] and just came back.  We will get labs today and then every 3 months to monitor for drug toxicity.- Plan: CBC with Differential/Platelet, COMPLETE METABOLIC PANEL WITH GFR  Polymyalgia rheumatica ([place]) -she is unable to taper prednisone below 4 mg.  She is on Prednisone 4 mg po qd. side effects of long-term use of prednisone were discussed.  Primary osteoarthritis of both hands-she has severe osteoarthritis.  Joint protection muscle strengthening was discussed.  Primary osteoarthritis of both knees-she has off-and-on stiffness in her knee joints.  No warmth swelling or effusion was noted.  Primary osteoarthritis of both feet-use of proper fitting shoes was discussed.  History of humerus fracture - followed by Dr. Latanya Maudlin.   Osteopenia of multiple sites - DEXA 05/15/18: The BMD measured at Femur Neck Left is 0.820 g/cm2 with a T-score of -1.6.  She has been advised to get repeat DEXA scan by Dr. Buddy Duty.  Chronic anticoagulation - she is on Xarelto.  She had AV valve replacement in October 2021.  History of diabetes mellitus  History of hyperlipidemia  History of hypertension-her blood pressure is well controlled.  History of gastroesophageal reflux (GERD)  Stress-lost her husband in April 2022.  She has been going through stressful time.  She has some situational depression.  Orders: Orders Placed This Encounter  Procedures  . CBC with Differential/Platelet  . COMPLETE METABOLIC PANEL WITH GFR   Meds ordered this encounter  Medications  . predniSONE (DELTASONE) 1 MG tablet    Sig: Take 3 tablets (3 mg total) by mouth daily.    Dispense:  90 tablet    Refill:  0  . folic acid (FOLVITE) 1 MG tablet    Sig: Take 1 tablet (1 mg total) by mouth daily.    Dispense:  180 tablet    Refill:  1  . methotrexate 2.5 MG tablet     Sig: TAKE 5 TABLETS BY MOUTH ONCE A WEEK    Dispense:  60 tablet    Refill:  0     Follow-Up Instructions: Return in about 5 months (around 03/18/2021) for Rheumatoid arthritis, Osteoarthritis.   Bo Merino, MD  Note - This record has been created using Editor, commissioning.  Chart creation errors have  been sought, but may not always  have been located. Such creation errors do not reflect on  the standard of medical care.

## 2020-10-10 IMAGING — DX DG CHEST 1V PORT
1 series · 1 of 1 positions shown · non-contrast
Comparison: 10/27/2018

CLINICAL DATA: Shortness of breath

EXAM:
PORTABLE CHEST 1 VIEW

[chest ap]
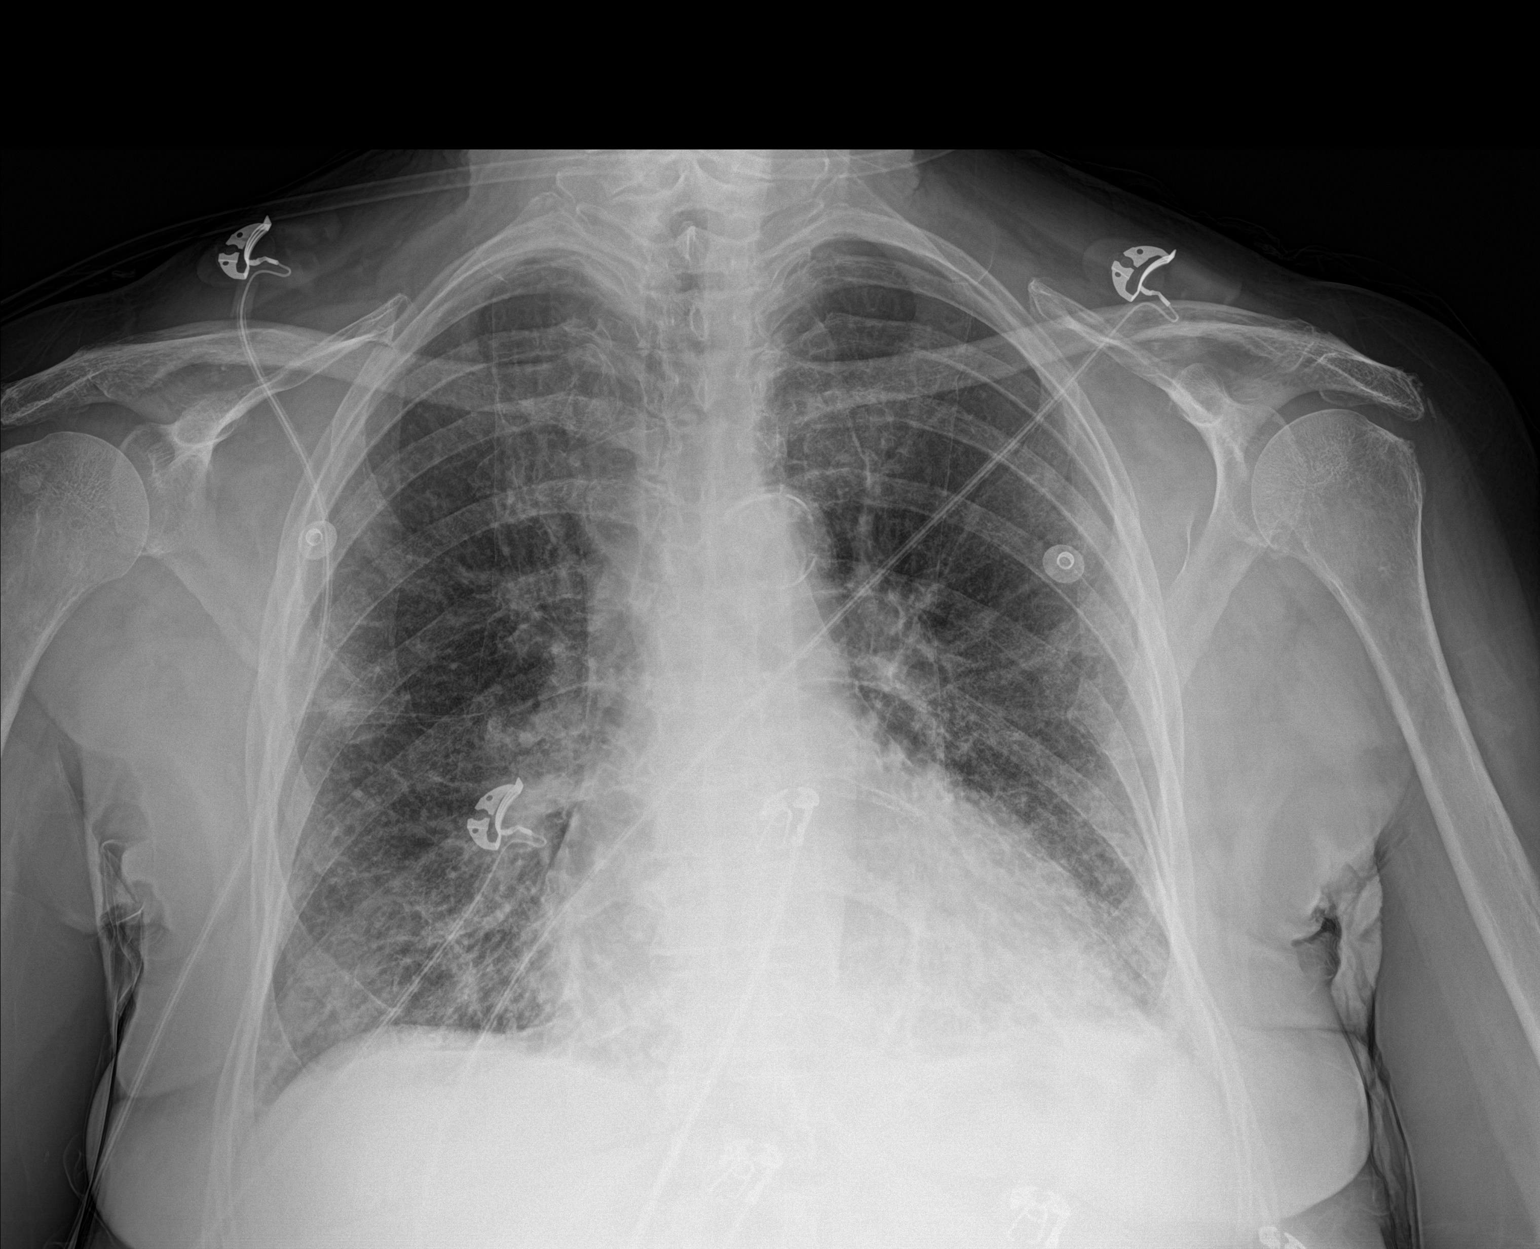

[1 of 1 positions shown; findings below may reference images not displayed]

FINDINGS: Borderline heart size. Bilateral septal thickening and patchy
airspace type opacity. No visible effusion or air leak.
Atherosclerosis.
IMPRESSION: Patchy pulmonary opacity, favor atypical pneumonia over CHF.

## 2020-10-16 ENCOUNTER — Encounter: Payer: Self-pay | Admitting: Rheumatology

## 2020-10-16 ENCOUNTER — Ambulatory Visit (INDEPENDENT_AMBULATORY_CARE_PROVIDER_SITE_OTHER): Payer: Medicare Other | Admitting: Rheumatology

## 2020-10-16 ENCOUNTER — Other Ambulatory Visit: Payer: Self-pay

## 2020-10-16 VITALS — BP 131/63 | HR 66 | Resp 14 | Ht 62.5 in | Wt 165.0 lb

## 2020-10-16 DIAGNOSIS — M170 Bilateral primary osteoarthritis of knee: Secondary | ICD-10-CM

## 2020-10-16 DIAGNOSIS — F439 Reaction to severe stress, unspecified: Secondary | ICD-10-CM

## 2020-10-16 DIAGNOSIS — M19041 Primary osteoarthritis, right hand: Secondary | ICD-10-CM | POA: Diagnosis not present

## 2020-10-16 DIAGNOSIS — M353 Polymyalgia rheumatica: Secondary | ICD-10-CM | POA: Diagnosis not present

## 2020-10-16 DIAGNOSIS — M8589 Other specified disorders of bone density and structure, multiple sites: Secondary | ICD-10-CM | POA: Diagnosis not present

## 2020-10-16 DIAGNOSIS — M19042 Primary osteoarthritis, left hand: Secondary | ICD-10-CM

## 2020-10-16 DIAGNOSIS — Z79899 Other long term (current) drug therapy: Secondary | ICD-10-CM

## 2020-10-16 DIAGNOSIS — M0579 Rheumatoid arthritis with rheumatoid factor of multiple sites without organ or systems involvement: Secondary | ICD-10-CM | POA: Diagnosis not present

## 2020-10-16 DIAGNOSIS — Z7901 Long term (current) use of anticoagulants: Secondary | ICD-10-CM | POA: Diagnosis not present

## 2020-10-16 DIAGNOSIS — Z8719 Personal history of other diseases of the digestive system: Secondary | ICD-10-CM | POA: Diagnosis not present

## 2020-10-16 DIAGNOSIS — Z8781 Personal history of (healed) traumatic fracture: Secondary | ICD-10-CM | POA: Diagnosis not present

## 2020-10-16 DIAGNOSIS — M19071 Primary osteoarthritis, right ankle and foot: Secondary | ICD-10-CM | POA: Diagnosis not present

## 2020-10-16 DIAGNOSIS — Z8679 Personal history of other diseases of the circulatory system: Secondary | ICD-10-CM

## 2020-10-16 DIAGNOSIS — Z8639 Personal history of other endocrine, nutritional and metabolic disease: Secondary | ICD-10-CM | POA: Diagnosis not present

## 2020-10-16 DIAGNOSIS — M19072 Primary osteoarthritis, left ankle and foot: Secondary | ICD-10-CM

## 2020-10-16 MED ORDER — METHOTREXATE SODIUM 2.5 MG PO TABS: ORAL_TABLET | ORAL | 0 refills | Status: DC

## 2020-10-16 MED ORDER — PREDNISONE 1 MG PO TABS
3.0000 mg | ORAL_TABLET | Freq: Every day | ORAL | 0 refills | Status: DC
Start: 2020-10-16 — End: 2020-11-09

## 2020-10-16 MED ORDER — FOLIC ACID 1 MG PO TABS: 1.0000 mg | ORAL_TABLET | Freq: Every day | ORAL | 1 refills | Status: DC

## 2020-10-16 NOTE — Patient Instructions (Signed)
Standing Labs We placed an order today for your standing lab work.   Please have your standing labs drawn in August and every 3 months   If possible, please have your labs drawn 2 weeks prior to your appointment so that the provider can discuss your results at your appointment.  Please note that you may see your imaging and lab results in MyChart before we have reviewed them. We may be awaiting multiple results to interpret others before contacting you. Please allow our office up to 72 hours to thoroughly review all of the results before contacting the office for clarification of your results.  We have open lab daily: Monday through Thursday from 1:30-4:30 PM and Friday from 1:30-4:00 PM at the office of Dr. Severus Brodzinski, [facility].   Please be advised, all patients with office appointments requiring lab work will take precedent over walk-in lab work.  If possible, please come for your lab work on Monday and Friday afternoons, as you may experience shorter wait times. The office is located at [address], [address], [place], [place] [address] No appointment is necessary.   Labs are drawn by [facility]. Please bring your co-pay at the time of your lab draw.  You may receive a bill from [facility] for your lab work.  If you wish to have your labs drawn at another location, please call the office 24 hours in advance to send orders.  If you have any questions regarding directions or hours of operation,  please call [phone].   As a reminder, please drink plenty of water prior to coming for your lab work. Thanks!  

## 2020-10-17 LAB — COMPLETE METABOLIC PANEL WITH GFR
AG Ratio: 1.8 (calc) (ref 1.0–2.5)
ALT: 10 U/L (ref 6–29)
AST: 15 U/L (ref 10–35)
Albumin: 4.3 g/dL (ref 3.6–5.1)
Alkaline phosphatase (APISO): 58 U/L (ref 37–153)
BUN/Creatinine Ratio: 17 (calc) (ref 6–22)
BUN: 16 mg/dL (ref 7–25)
CO2: 27 mmol/L (ref 20–32)
Calcium: 9.6 mg/dL (ref 8.6–10.4)
Chloride: 104 mmol/L (ref 98–110)
Creat: 0.92 mg/dL — ABNORMAL HIGH (ref 0.60–0.88)
GFR, Est African American: 66 mL/min/{1.73_m2} (ref >=60)
GFR, Est Non African American: 57 mL/min/{1.73_m2} — ABNORMAL LOW (ref >=60)
Globulin: 2.4 g/dL (calc) (ref 1.9–3.7)
Glucose, Bld: 157 mg/dL — ABNORMAL HIGH (ref 65–99)
Potassium: 4.1 mmol/L (ref 3.5–5.3)
Sodium: 141 mmol/L (ref 135–146)
Total Bilirubin: 0.5 mg/dL (ref 0.2–1.2)
Total Protein: 6.7 g/dL (ref 6.1–8.1)

## 2020-10-17 LAB — CBC WITH DIFFERENTIAL/PLATELET
Absolute Monocytes: 1695 cells/uL — ABNORMAL HIGH (ref 200–950)
Basophils Absolute: 79 cells/uL (ref 0–200)
Basophils Relative: 0.7 %
Eosinophils Absolute: 350 cells/uL (ref 15–500)
Eosinophils Relative: 3.1 %
HCT: 40.1 % (ref 35.0–45.0)
Hemoglobin: 12.8 g/dL (ref 11.7–15.5)
Lymphs Abs: 1639 cells/uL (ref 850–3900)
MCH: 30.5 pg (ref 27.0–33.0)
MCHC: 31.9 g/dL — ABNORMAL LOW (ref 32.0–36.0)
MCV: 95.5 fL (ref 80.0–100.0)
MPV: 9.8 fL (ref 7.5–12.5)
Monocytes Relative: 15 %
Neutro Abs: 7537 cells/uL (ref 1500–7800)
Neutrophils Relative %: 66.7 %
Platelets: 310 10*3/uL (ref 140–400)
RBC: 4.2 10*6/uL (ref 3.80–5.10)
RDW: 14.4 % (ref 11.0–15.0)
Total Lymphocyte: 14.5 %
WBC: 11.3 10*3/uL — ABNORMAL HIGH (ref 3.8–10.8)

## 2020-10-17 NOTE — Progress Notes (Signed)
White cell count is elevated due to prednisone use.  Glucose is elevated due to prednisone use.  Creatinine is mildly elevated and stable.  Please forward labs to her PCP.

## 2020-10-19 ENCOUNTER — Telehealth: Payer: Self-pay | Admitting: *Deleted

## 2020-10-19 DIAGNOSIS — N183 Chronic kidney disease, stage 3 unspecified: Secondary | ICD-10-CM | POA: Diagnosis not present

## 2020-10-19 DIAGNOSIS — F341 Dysthymic disorder: Secondary | ICD-10-CM | POA: Diagnosis not present

## 2020-10-19 DIAGNOSIS — Z952 Presence of prosthetic heart valve: Secondary | ICD-10-CM | POA: Diagnosis not present

## 2020-10-19 DIAGNOSIS — M8588 Other specified disorders of bone density and structure, other site: Secondary | ICD-10-CM | POA: Diagnosis not present

## 2020-10-19 DIAGNOSIS — M069 Rheumatoid arthritis, unspecified: Secondary | ICD-10-CM | POA: Diagnosis not present

## 2020-10-19 DIAGNOSIS — I480 Paroxysmal atrial fibrillation: Secondary | ICD-10-CM | POA: Diagnosis not present

## 2020-10-19 DIAGNOSIS — M48061 Spinal stenosis, lumbar region without neurogenic claudication: Secondary | ICD-10-CM | POA: Diagnosis not present

## 2020-10-19 DIAGNOSIS — E1122 Type 2 diabetes mellitus with diabetic chronic kidney disease: Secondary | ICD-10-CM | POA: Diagnosis not present

## 2020-10-19 DIAGNOSIS — I5030 Unspecified diastolic (congestive) heart failure: Secondary | ICD-10-CM | POA: Diagnosis not present

## 2020-10-19 DIAGNOSIS — I10 Essential (primary) hypertension: Secondary | ICD-10-CM | POA: Diagnosis not present

## 2020-10-19 DIAGNOSIS — M353 Polymyalgia rheumatica: Secondary | ICD-10-CM | POA: Diagnosis not present

## 2020-10-19 DIAGNOSIS — Z0000 Encounter for general adult medical examination without abnormal findings: Secondary | ICD-10-CM | POA: Diagnosis not present

## 2020-10-19 DIAGNOSIS — M109 Gout, unspecified: Secondary | ICD-10-CM | POA: Diagnosis not present

## 2020-10-19 DIAGNOSIS — E782 Mixed hyperlipidemia: Secondary | ICD-10-CM | POA: Diagnosis not present

## 2020-10-19 NOTE — Telephone Encounter (Signed)
   [facility] Pre-operative Risk Assessment    Patient Name: Sophia Ward  DOB: 09-24-1932  MRN: [id number]   HEARTCARE STAFF: - Please ensure there is not already an duplicate clearance open for this procedure. - Under Visit Info/Reason for Call, type in Other and utilize the format Clearance MM/DD/YY or Clearance TBD. Do not use dashes or single digits. - If request is for dental extraction, please clarify the # of teeth to be extracted.  Request for surgical clearance:  1. What type of surgery is being performed? INTERVENTIONAL INJECTION FOR THERAPEUTIC PAIN MANAGEMENT (SPINAL)  2. When is this surgery scheduled? TBD   3. What type of clearance is required (medical clearance vs. Pharmacy clearance to hold med vs. Both)? MEDICAL  4. Are there any medications that need to be held prior to surgery and how long? PLAVIX x 7 DAYS PER (the American Society of Regional Anesthesia & Pain Medicine)    5. Practice name and name of physician performing surgery? [facility]; DR. Annie Main FRIEDMAN   6. What is the office phone number? [phone]   7.   What is the office fax number? [phone]  8.   Anesthesia type (None, local, MAC, general) ? NOT LISTED   [name] 10/19/2020, 1:16 PM  _________________________________________________________________   (provider comments below)

## 2020-10-20 DIAGNOSIS — H33052 Total retinal detachment, left eye: Secondary | ICD-10-CM | POA: Diagnosis not present

## 2020-10-20 DIAGNOSIS — E119 Type 2 diabetes mellitus without complications: Secondary | ICD-10-CM | POA: Diagnosis not present

## 2020-10-20 DIAGNOSIS — H40013 Open angle with borderline findings, low risk, bilateral: Secondary | ICD-10-CM | POA: Diagnosis not present

## 2020-10-20 DIAGNOSIS — H35352 Cystoid macular degeneration, left eye: Secondary | ICD-10-CM | POA: Diagnosis not present

## 2020-10-20 NOTE — Progress Notes (Signed)
[facility]    HEMATOLOGY/ONCOLOGY CLINIC NOTE  Date of Service: 10/21/2020  Patient Care Team: [name], MD as PCP - General (Internal Medicine) Jettie Booze, MD as PCP - Cardiology (Cardiology) Brunetta Genera, MD as Consulting Physician (Hematology) Bo Merino, MD as Consulting Physician (Rheumatology)   CHIEF COMPLAINTS/PURPOSE OF CONSULTATION:  Elevated WBC counts   HISTORY OF PRESENTING ILLNESS:  Sophia Ward is a wonderful 87 y.o. female who has been referred to [place] by Dr .[name], MD for evaluation and management of leucocytosis.  Patient has a h/o RA on MTX and prednisone taper, CAD,htn who was referral for evaluation of leucocytosis. Patient notes that she has had elevated WBC counts for "years".  Based on available labs patient has had chronic neutrophilia and monocytosis since at least 10/2016 with WBC counts ranging form 14k to 22k. Recent labs on 12/6 showed WBC counts of 22k which has since improved to 14.1l on 06/05/2018. hgb is normal at 13.4 and platelets are normal at 331k.  She notes no fevers/chills/nightsweats/or unexpected weight loss. Has joint pain issues from RA.  No other acute new focal symptoms.   INTERVAL HISTORY:  Sophia Ward is a 87 y.o. female here today for follow up and treatment of her leucocytosis and anemia. The patient's last visit with [place] was on 04/28/2020. The pt reports that she is doing well overall. We are joined today by her daughter.  The pt reports no new symptoms or concerns since the last visit. She has had no issues tolerating the oral iron outside of very dark stools. She has been eating well and her RA has been very stable with her current regiment.   The pt feels as though she has a UTI. She provided the lab with a urine sample today. She notes that she has a hx of monthly UTIs and has another infection after being off of medicine for ten days. She went to a bladder institute in [place] and they  put her on an estrogen cream and did a biopsy of the bladder and saw it was just redness. They recommended she try D-Mannose and see how this helped. She notes her PCP was not in favor of going on a preventive dose of antibiotics for preventing UTIs. She was last on Doxycycline in late March.  Lab results today 10/21/2020 of CBC w/diff and CMP is as follows: all values are WNL except for WBC of 13.0K, RBC of 3.76, Hgb of 11.8, HCT of 35.6, Neutro Abs of 9.8K, Monocytes Abs of 1.6K, Glucose of 61, Creatinine of 1.06, AST of 14, GFR est of 51. 10/21/2020 Ferritin 137 10/21/2020 Iron 11% 10/21/2020 Vitamin B12- wnl 582  On review of systems, pt reports frequent urination, recurrent UTIs, dark stools due to iron, urinary discomfort, darker urine color and denies acute back pain, fevers, chills, night sweats, sudden weight loss, abdominal pain, and any other symptoms.  MEDICAL HISTORY:  Past Medical History:  Diagnosis Date  . Allergic rhinitis   . Anemia 09/2019  . Arthritis    hands  . BPPV (benign paroxysmal positional vertigo)   . Coronary artery disease   . Diabetes mellitus without complication ([place])   . Essential hypertension, benign 01/30/2014  . GERD (gastroesophageal reflux disease)   . Hypertension   . Lesion of pancreas   . Mixed hyperlipidemia 01/30/2014  . Myocardial infarction ([place]) 2004   mild MI-no damage- had stents  . Osteopenia   . Post-menopausal   . Pulmonary nodules   .  Rheumatoid arthritis ([place])   . S/P TAVR (transcatheter aortic valve replacement) 03/03/2020   s/p TAVR with a 23 mm Edwards S3U via the TF approach by Drs Burt Knack and Roxy Manns  . Severe aortic stenosis   . Spinal stenosis   . Stenosing tenosynovitis     SURGICAL HISTORY: Past Surgical History:  Procedure Laterality Date  . ABDOMINAL HYSTERECTOMY    . APPENDECTOMY    . BIOPSY  10/25/2019   Procedure: BIOPSY;  Surgeon: Otis Brace, MD;  Location: [facility];  Service: Gastroenterology;;  .  BIOPSY  10/26/2019   Procedure: BIOPSY;  Surgeon: Otis Brace, MD;  Location: [facility];  Service: Gastroenterology;;  . CARDIAC CATHETERIZATION  2004  . carpel tunnel    . COLONOSCOPY WITH PROPOFOL N/A 10/30/2012   Procedure: COLONOSCOPY WITH PROPOFOL;  Surgeon: Garlan Fair, MD;  Location: [facility];  Service: Endoscopy;  Laterality: N/A;  . COLONOSCOPY WITH PROPOFOL N/A 10/26/2019   Procedure: COLONOSCOPY WITH PROPOFOL;  Surgeon: Otis Brace, MD;  Location: [facility];  Service: Gastroenterology;  Laterality: N/A;  . CORONARY ANGIOPLASTY  2004  . CORONARY ATHERECTOMY N/A 02/21/2020   Procedure: CORONARY ATHERECTOMY;  Surgeon: Jettie Booze, MD;  Location: [place] [facility];  Service: Cardiovascular;  Laterality: N/A;  . CORONARY STENT INTERVENTION N/A 02/21/2020   Procedure: CORONARY STENT INTERVENTION;  Surgeon: Jettie Booze, MD;  Location: [facility] [facility];  Service: Cardiovascular;  Laterality: N/A;  . DILATION AND CURETTAGE OF UTERUS    . ESOPHAGOGASTRODUODENOSCOPY N/A 10/25/2019   Procedure: ESOPHAGOGASTRODUODENOSCOPY (EGD);  Surgeon: Otis Brace, MD;  Location: [facility];  Service: Gastroenterology;  Laterality: N/A;  . ESOPHAGOGASTRODUODENOSCOPY (EGD) WITH PROPOFOL N/A 10/30/2012   Procedure: ESOPHAGOGASTRODUODENOSCOPY (EGD) WITH PROPOFOL;  Surgeon: Garlan Fair, MD;  Location: [facility];  Service: Endoscopy;  Laterality: N/A;  . EYE SURGERY     bilateral cataract with lens implant  . EYE SURGERY    . INJECTION OF SILICONE OIL Left 6/76/1950   Procedure: Injection Of Silicone Oil;  Surgeon: Jalene Mullet, MD;  Location: [facility];  Service: Ophthalmology;  Laterality: Left;  . INTRAVASCULAR PRESSURE WIRE/FFR STUDY N/A 01/29/2020   Procedure: INTRAVASCULAR PRESSURE WIRE/FFR STUDY;  Surgeon: Jettie Booze, MD;  Location: [place] [facility];  Service: Cardiovascular;  Laterality: N/A;  . INTRAVASCULAR ULTRASOUND/IVUS N/A 02/21/2020    Procedure: Intravascular Ultrasound/IVUS;  Surgeon: Jettie Booze, MD;  Location: [place] [facility];  Service: Cardiovascular;  Laterality: N/A;  . left shouler surgery    . PHOTOCOAGULATION WITH LASER Left 02/12/2019   Procedure: Photocoagulation With Laser;  Surgeon: Jalene Mullet, MD;  Location: [place];  Service: Ophthalmology;  Laterality: Left;  . 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 02/12/2019   Procedure: REPAIR OF COMPLEX TRACTION RETINAL DETACHMENT;  Surgeon: Jalene Mullet, MD;  Location: [place];  Service: Ophthalmology;  Laterality: Left;  . RIGHT/LEFT HEART CATH AND CORONARY ANGIOGRAPHY N/A 01/29/2020   Procedure: RIGHT/LEFT HEART CATH AND CORONARY ANGIOGRAPHY;  Surgeon: Jettie Booze, MD;  Location: [place] [facility];  Service: Cardiovascular;  Laterality: N/A;  . TEE WITHOUT CARDIOVERSION N/A 03/03/2020   Procedure: TRANSESOPHAGEAL ECHOCARDIOGRAM (TEE);  Surgeon: Sherren Mocha, MD;  Location: [facility];  Service: Open Heart Surgery;  Laterality: N/A;  . TONSILLECTOMY    . TRANSCATHETER AORTIC VALVE REPLACEMENT, TRANSFEMORAL  03/03/2020  . TRANSCATHETER AORTIC VALVE REPLACEMENT, TRANSFEMORAL N/A 03/03/2020   Procedure: TRANSCATHETER AORTIC VALVE REPLACEMENT, TRANSFEMORAL USING EDWARDS SAPIEN 3 23 MM AORTIC  VALVE.;  Surgeon: Sherren Mocha, MD;  Location: [place];  Service: Open Heart Surgery;  Laterality: N/A;  . VITRECTOMY 25 GAUGE WITH SCLERAL BUCKLE Left 02/12/2019   Procedure: Vitrectomy 25 Gauge With Scleral Buckle;  Surgeon: Jalene Mullet, MD;  Location: [facility] [place];  Service: Ophthalmology;  Laterality: Left;    SOCIAL HISTORY: Social History   Socioeconomic History  . Marital status: Widowed    Spouse name: Not on file  . Number of children: Not on file  . Years of education: Not on file  . Highest education level: Not on file  Occupational History  . Not on file  Tobacco Use  . Smoking status: Never Smoker  . Smokeless tobacco: Never Used  Vaping  Use  . Vaping Use: Never used  Substance and Sexual Activity  . Alcohol use: No  . Drug use: No  . Sexual activity: Not Currently  Other Topics Concern  . Not on file  Social History Narrative  . Not on file   Social Determinants of Health   Financial Resource Strain: Not on file  Food Insecurity: Not on file  Transportation Needs: Not on file  Physical Activity: Not on file  Stress: Not on file  Social Connections: Not on file  Intimate Partner Violence: Not on file    FAMILY HISTORY: Family History  Problem Relation Age of Onset  . CAD Mother   . Heart attack Mother   . CAD Father   . Heart Problems Brother        open heart surgery   . Arthritis Daughter        psoriatic arthritis     ALLERGIES:  is allergic to codeine, glimepiride, jardiance [empagliflozin], metformin and related, and penicillins.   MEDICATIONS:  Current Outpatient Medications  Medication Sig Dispense Refill  . amLODipine (NORVASC) 10 MG tablet Take 1 tablet (10 mg total) by mouth every morning. 90 tablet 3  . apixaban (ELIQUIS) 5 MG TABS tablet Take 1 tablet (5 mg total) by mouth 2 (two) times daily. 60 tablet   . atorvastatin (LIPITOR) 40 MG tablet Take 1 tablet (40 mg total) by mouth at bedtime. 90 tablet 3  . azithromycin (ZITHROMAX) 500 MG tablet Take 1 tablet (500 mg total) by mouth as directed. One tablet by mouth 60 MINUTES before any dental appointment 3 tablet 2  . B-D ULTRAFINE III SHORT PEN 31G X 8 MM MISC 1 each by Other route in the morning, at noon, in the evening, and at bedtime.     . Calcium Carbonate-Vitamin D (CALCIUM + D PO) Take 1 tablet by mouth daily.     . Cholecalciferol (VITAMIN D3) 125 MCG (5000 UT) CAPS Take 5,000 Units by mouth daily.     . clopidogrel (PLAVIX) 75 MG tablet Take 4 tablets (300 mg) by mouth this evening, then take 1 tablet (75 mg) by mouth daily (Patient taking differently: Take 75 mg by mouth daily. Take 4 tablets (300 mg) by mouth this evening, then  take 1 tablet (75 mg) by mouth daily) 90 tablet 0  . desonide (DESOWEN) 0.05 % cream Apply 1 application topically 2 (two) times daily as needed.    Marland Kitchen escitalopram (LEXAPRO) 5 MG tablet Take 5 mg by mouth daily.    . ferrous sulfate 325 (65 FE) MG tablet Take 1 tablet (325 mg total) by mouth 2 (two) times daily with a meal. 60 tablet 1  . folic acid (FOLVITE) 1 MG tablet Take 1 tablet (1  mg total) by mouth daily. 180 tablet 1  . gabapentin (NEURONTIN) 100 MG capsule TAKE 1 TO 2 CAPSULES BY MOUTH ONCE DAILY AT BEDTIME AS NEEDED    . insulin lispro (HUMALOG) 100 UNIT/ML KiwkPen Inject 6 Units into the skin 3 (three) times daily.     Marland Kitchen LEVEMIR FLEXTOUCH 100 UNIT/ML Pen Inject 14 Units into the skin every morning.   0  . methotrexate 2.5 MG tablet TAKE 5 TABLETS BY MOUTH ONCE A WEEK 60 tablet 0  . metoprolol succinate (TOPROL-XL) 100 MG 24 hr tablet Take 1 tablet (100 mg total) by mouth every morning. 90 tablet 3  . nitroGLYCERIN (NITROSTAT) 0.4 MG SL tablet DISSOLVE ONE TABLET UNDER THE TONGUE EVERY 5 MINUTES AS NEEDED FOR CHEST PAIN.  DO NOT EXCEED A TOTAL OF 3 DOSES IN 15 MINUTES 25 tablet 5  . Omega-3 Fatty Acids (FISH OIL PO) Take 1 capsule by mouth daily.     . ondansetron (ZOFRAN ODT) 4 MG disintegrating tablet Take 1 tablet (4 mg total) by mouth every 8 (eight) hours as needed for nausea or vomiting. 20 tablet 0  . pantoprazole (PROTONIX) 40 MG tablet Take 1 tablet (40 mg total) by mouth daily after lunch. 30 tablet 1  . predniSONE (DELTASONE) 1 MG tablet Take 3 tablets (3 mg total) by mouth daily. 90 tablet 0  . ramipril (ALTACE) 10 MG tablet Take 10 mg by mouth 2 (two) times daily.      No current facility-administered medications for this visit.    REVIEW OF SYSTEMS:   10 Point review of Systems was done is negative except as noted above.  PHYSICAL EXAMINATION: ECOG PERFORMANCE STATUS: 1 - Symptomatic but completely ambulatory  Vitals:   10/21/20 1448  BP: (!) 135/51  Pulse: 66   Resp: 18  Temp: (!) 97.5 F (36.4 C)  SpO2: 99%   Filed Weights   10/21/20 1448  Weight: 170 lb 1.6 oz (77.2 kg)   Body mass index is 30.62 kg/m.  Exam was given in a chair.   GENERAL:alert, in no acute distress and comfortable SKIN: no acute rashes, no significant lesions EYES: conjunctiva are pink and non-injected, sclera anicteric OROPHARYNX: MMM, no exudates, no oropharyngeal erythema or ulceration NECK: supple, no JVD LYMPH:  no palpable lymphadenopathy in the cervical, axillary or inguinal regions LUNGS: clear to auscultation b/l with normal respiratory effort HEART: regular rate & rhythm ABDOMEN:  normoactive bowel sounds , non tender, not distended. No palpable hepatosplenomegaly.  Extremity: no pedal edema PSYCH: alert & oriented x 3 with fluent speech NEURO: no focal motor/sensory deficits  LABORATORY DATA:  I have reviewed the data as listed  CBC Latest Ref Rng & Units 10/21/2020 10/16/2020 04/27/2020  WBC 4.0 - 10.5 K/uL 13.0(H) 11.3(H) 15.3(H)  Hemoglobin 12.0 - 15.0 g/dL 11.8(L) 12.8 11.3(L)  Hematocrit 36.0 - 46.0 % 35.6(L) 40.1 35.6  Platelets 150 - 400 K/uL 293 310 379   CBC    Component Value Date/Time   WBC 13.0 (H) 10/21/2020 1430   RBC 3.76 (L) 10/21/2020 1430   HGB 11.8 (L) 10/21/2020 1430   HGB 11.8 04/02/2020 1527   HCT 35.6 (L) 10/21/2020 1430   HCT 35.6 04/27/2020 1005   PLT 293 10/21/2020 1430   PLT 307 04/02/2020 1527   MCV 94.7 10/21/2020 1430   MCV 85 04/02/2020 1527   MCH 31.4 10/21/2020 1430   MCHC 33.1 10/21/2020 1430   RDW 15.5 10/21/2020 1430   RDW 16.6 (H)  04/02/2020 1527   LYMPHSABS 1.2 10/21/2020 1430   LYMPHSABS 1.2 04/02/2020 1527   MONOABS 1.6 (H) 10/21/2020 1430   EOSABS 0.3 10/21/2020 1430   EOSABS 0.3 04/02/2020 1527   BASOSABS 0.1 10/21/2020 1430   BASOSABS 0.1 04/02/2020 1527    . CMP Latest Ref Rng & Units 10/21/2020 10/16/2020 04/27/2020  Glucose 70 - 99 mg/dL 61(L) 157(H) 182(H)  BUN 8 - 23 mg/dL 21 16 17    Creatinine 0.44 - 1.00 mg/dL 1.06(H) 0.92(H) 1.06(H)  Sodium 135 - 145 mmol/L 140 141 139  Potassium 3.5 - 5.1 mmol/L 4.6 4.1 3.9  Chloride 98 - 111 mmol/L 104 104 104  CO2 22 - 32 mmol/L 26 27 25   Calcium 8.9 - 10.3 mg/dL 9.4 9.6 10.0  Total Protein 6.5 - 8.1 g/dL 7.2 6.7 7.3  Total Bilirubin 0.3 - 1.2 mg/dL 0.6 0.5 0.4  Alkaline Phos 38 - 126 U/L 64 - 50  AST 15 - 41 U/L 14(L) 15 16  ALT 0 - 44 U/L 9 10 13    Lab Results  Component Value Date   LDH 237 (H) 01/06/2020   Sed rate 14  . Lab Results  Component Value Date   IRON 32 (L) 04/27/2020   TIBC 273 04/27/2020   IRONPCTSAT 12 (L) 04/27/2020   (Iron and TIBC)  Lab Results  Component Value Date   FERRITIN 78 04/27/2020     06/05/2018 [facility] FISH BCR/ABL    06/05/2018 [facility] OnkoSite NGS JAK2 MPL and CALR    RADIOGRAPHIC STUDIES: I have personally reviewed the radiological images as listed and agreed with the findings in the report. No results found.   ASSESSMENT & PLAN:   87 yo with    1) Chronic leucocytosis from atleast 10/2016 in the 14-22k range. Primarily neutrophilia and monocytosis. Cannot r/o CMML. R/o CML neg for BCR ABL Reactive process due to inflammation from RA Does on report any recent fevers or fevers. -Clonal markers neg for BCR-ABL, Jak2,MPL and CALR   2) Microcytic Anemia due to IDA and anemia of chronic disease  PLAN: -Discussed pt labwork today, 10/21/2020; blood counts stable, chemistries stable. Other labs pending. -Advised pt we can order a urine lab today to test for recurrent UTI. -Advised pt that her RA medications can increase risk of infections and drive the recurrent UTIs. -Discussed consolidation of care. Advised pt we can consolidate care with PCP given stability in labs. -Recommend pt continue 325 mg Ferrous Sulfate BID -Continue daily B-complex. -The pt notes her current pharmacy is [facility] on [address] in [place], [place]. -Will see back as  needed.   FOLLOW UP: RTC w Dr Irene Limbo as needed   The total time spent in the appt was 30 minutes and more than 50% was on counseling and direct patient cares.  All of the patient's questions were answered with apparent satisfaction. The patient knows to call the clinic with any problems, questions or concerns.   Sullivan Lone MD [place] AAHIVMS [facility] Hematology/Oncology Physician [facility]  (Office):       [phone] (Work cell):  [phone]  (Fax):           [phone]  10/21/2020 3:25 PM  I, Reinaldo Raddle, am acting as scribe for Dr. Sullivan Lone, MD.      .I have reviewed the above documentation for accuracy and completeness, and I agree with the above. Brunetta Genera MD

## 2020-10-21 ENCOUNTER — Other Ambulatory Visit: Payer: Self-pay

## 2020-10-21 ENCOUNTER — Inpatient Hospital Stay (HOSPITAL_BASED_OUTPATIENT_CLINIC_OR_DEPARTMENT_OTHER): Payer: Medicare Other | Admitting: Hematology

## 2020-10-21 ENCOUNTER — Inpatient Hospital Stay: Payer: Medicare Other | Attending: Hematology

## 2020-10-21 VITALS — BP 135/51 | HR 66 | Temp 97.5°F | Resp 18 | Wt 170.1 lb

## 2020-10-21 DIAGNOSIS — E119 Type 2 diabetes mellitus without complications: Secondary | ICD-10-CM | POA: Diagnosis not present

## 2020-10-21 DIAGNOSIS — D509 Iron deficiency anemia, unspecified: Secondary | ICD-10-CM | POA: Insufficient documentation

## 2020-10-21 DIAGNOSIS — I10 Essential (primary) hypertension: Secondary | ICD-10-CM | POA: Diagnosis not present

## 2020-10-21 DIAGNOSIS — Z79899 Other long term (current) drug therapy: Secondary | ICD-10-CM | POA: Diagnosis not present

## 2020-10-21 DIAGNOSIS — M069 Rheumatoid arthritis, unspecified: Secondary | ICD-10-CM | POA: Insufficient documentation

## 2020-10-21 DIAGNOSIS — D72829 Elevated white blood cell count, unspecified: Secondary | ICD-10-CM

## 2020-10-21 DIAGNOSIS — Z794 Long term (current) use of insulin: Secondary | ICD-10-CM | POA: Diagnosis not present

## 2020-10-21 DIAGNOSIS — D500 Iron deficiency anemia secondary to blood loss (chronic): Secondary | ICD-10-CM

## 2020-10-21 DIAGNOSIS — Z7901 Long term (current) use of anticoagulants: Secondary | ICD-10-CM | POA: Diagnosis not present

## 2020-10-21 DIAGNOSIS — I2510 Atherosclerotic heart disease of native coronary artery without angina pectoris: Secondary | ICD-10-CM | POA: Diagnosis not present

## 2020-10-21 DIAGNOSIS — E538 Deficiency of other specified B group vitamins: Secondary | ICD-10-CM

## 2020-10-21 DIAGNOSIS — R300 Dysuria: Secondary | ICD-10-CM

## 2020-10-21 LAB — CBC WITH DIFFERENTIAL/PLATELET
Abs Immature Granulocytes: 0.04 10*3/uL (ref 0.00–0.07)
Basophils Absolute: 0.1 10*3/uL (ref 0.0–0.1)
Basophils Relative: 1 %
Eosinophils Absolute: 0.3 10*3/uL (ref 0.0–0.5)
Eosinophils Relative: 3 %
HCT: 35.6 % — ABNORMAL LOW (ref 36.0–46.0)
Hemoglobin: 11.8 g/dL — ABNORMAL LOW (ref 12.0–15.0)
Immature Granulocytes: 0 %
Lymphocytes Relative: 9 %
Lymphs Abs: 1.2 10*3/uL (ref 0.7–4.0)
MCH: 31.4 pg (ref 26.0–34.0)
MCHC: 33.1 g/dL (ref 30.0–36.0)
MCV: 94.7 fL (ref 80.0–100.0)
Monocytes Absolute: 1.6 10*3/uL — ABNORMAL HIGH (ref 0.1–1.0)
Monocytes Relative: 12 %
Neutro Abs: 9.8 10*3/uL — ABNORMAL HIGH (ref 1.7–7.7)
Neutrophils Relative %: 75 %
Platelets: 293 10*3/uL (ref 150–400)
RBC: 3.76 MIL/uL — ABNORMAL LOW (ref 3.87–5.11)
RDW: 15.5 % (ref 11.5–15.5)
WBC: 13 10*3/uL — ABNORMAL HIGH (ref 4.0–10.5)
nRBC: 0 % (ref 0.0–0.2)

## 2020-10-21 LAB — CMP (CANCER CENTER ONLY)
ALT: 9 U/L (ref 0–44)
AST: 14 U/L — ABNORMAL LOW (ref 15–41)
Albumin: 3.5 g/dL (ref 3.5–5.0)
Alkaline Phosphatase: 64 U/L (ref 38–126)
Anion gap: 10 (ref 5–15)
BUN: 21 mg/dL (ref 8–23)
CO2: 26 mmol/L (ref 22–32)
Calcium: 9.4 mg/dL (ref 8.9–10.3)
Chloride: 104 mmol/L (ref 98–111)
Creatinine: 1.06 mg/dL — ABNORMAL HIGH (ref 0.44–1.00)
GFR, Estimated: 51 mL/min — ABNORMAL LOW (ref >60)
Glucose, Bld: 61 mg/dL — ABNORMAL LOW (ref 70–99)
Potassium: 4.6 mmol/L (ref 3.5–5.1)
Sodium: 140 mmol/L (ref 135–145)
Total Bilirubin: 0.6 mg/dL (ref 0.3–1.2)
Total Protein: 7.2 g/dL (ref 6.5–8.1)

## 2020-10-21 LAB — IRON AND TIBC
Iron: 31 ug/dL — ABNORMAL LOW (ref 41–142)
Saturation Ratios: 11 % — ABNORMAL LOW (ref 21–57)
TIBC: 269 ug/dL (ref 236–444)
UIBC: 238 ug/dL (ref 120–384)

## 2020-10-21 LAB — URINALYSIS, COMPLETE (UACMP) WITH MICROSCOPIC
Bilirubin Urine: NEGATIVE
Glucose, UA: NEGATIVE mg/dL
Ketones, ur: NEGATIVE mg/dL
Nitrite: NEGATIVE
Protein, ur: 30 mg/dL — AB
Specific Gravity, Urine: 1.011 (ref 1.005–1.030)
WBC, UA: >50 WBC/hpf — ABNORMAL HIGH (ref 0–5)
pH: 6 (ref 5.0–8.0)

## 2020-10-21 LAB — VITAMIN B12: Vitamin B-12: 582 pg/mL (ref 180–914)

## 2020-10-21 LAB — FERRITIN: Ferritin: 137 ng/mL (ref 11–307)

## 2020-10-21 NOTE — Telephone Encounter (Signed)
   Name: Sophia Ward DOB: 09-Nov-1932  MRN: [id number]  Primary Cardiologist: Larae Grooms, MD  Chart reviewed as part of pre-operative protocol coverage.   87 y.o. female with . Coronary artery disease  o Hx of MI S/p DES to LAD in 2004 o S/p repeat stent 5 mos post MI 2/2 ISR o S/p DES to RCA; s/p orbital atherectomy + DES to dLM/oLAD; s/p POBA to LCx (jailed by stent) in 01/2020 . Paroxysmal atrial fibrillation  o No anticoagulation due to bleeding risk; hx of anemia . AS s/p TAVR 03/03/20 . HFpEF  . Echocardiogram 11/21: normal EF, mild pulm HTN, normally fxn TAVR . Insulin Dependent Diabetes Mellitus   . Hypertension  . GI bleed   Last OV:  04/02/20 with Angelena Form, PA-C  Procedure:  ESI Rx:  Hold Plavix x 7 days; Not requested but pt on Eliquis as well  RCRI:  Perioperative Risk of Major Cardiac Event is (%): 11 (high risk) DASI:  Functional Capacity in METs is: 4.31 (functional status is fair )  Surgical clearance and anticoagulation addressed previously in 07/2020 (see phone note dated 07/28/20).  At that time it was recommended to wait until after April 2022 so that the pt was > 6 mos out from high risk PCI to hold Plavix and Eliquis for spinal injection.  It was approved to hold Plavix 5-7 days when the procedure was to be done.   Patient was contacted 10/21/2020 in reference to pre-operative risk assessment for pending surgery as outlined below.    Since last seen, [name] has done well without chest pain, shortness of breath.  She notes she was seen recently by the requesting provider.  Based upon previous review, our recommendation has been to hold Eliquis 3 days prior to her procedure and Plavix 7 days prior.  She notes that it was requested to hold her antiplatelet and anticoagulant drugs for longer.  She has an appt with Dr. Irish Lack 11/04/20.  The procedure will not be rescheduled until after she sees him.     Recommendations: . Will hold  off on clearance or sending any records to the requesting provider at this time.  Pt has f/u with Dr. Irish Lack on 11/04/20.  Final recommendations can be made at this appt.    Richardson Dopp, PA-C 10/21/2020, 9:25 AM

## 2020-10-22 ENCOUNTER — Other Ambulatory Visit: Payer: Self-pay

## 2020-10-22 ENCOUNTER — Encounter: Payer: Self-pay | Admitting: Hematology

## 2020-10-22 MED ORDER — LEVOFLOXACIN 250 MG PO TABS: 250.0000 mg | ORAL_TABLET | Freq: Every day | ORAL | 0 refills | Status: AC

## 2020-10-22 NOTE — Telephone Encounter (Signed)
   Patient Name: Sophia Ward  DOB: 1933/03/31  MRN: [id number]   Primary Cardiologist: Larae Grooms, MD  Chart reviewed as part of pre-operative protocol coverage. Patient will be evaluated by Dr. Irish Lack on 6/8 for preoperative risk stratification and to provide recommendations on medications. Will route update to to requesting office.    Christell Faith, PA-C 10/22/2020, 8:57 AM

## 2020-10-24 LAB — URINE CULTURE: Culture: >=100000 — AB

## 2020-10-27 ENCOUNTER — Encounter: Payer: Self-pay | Admitting: Hematology

## 2020-11-03 NOTE — Progress Notes (Signed)
Cardiology Office Note   Date:  11/04/2020   ID:  Sophia Ward, DOB Oct 10, 1932, MRN [id number]  PCP:  [name], MD    No chief complaint on file.  CAD  Wt Readings from Last 3 Encounters:  11/04/20 167 lb (75.8 kg)  10/21/20 170 lb 1.6 oz (77.2 kg)  10/16/20 165 lb (74.8 kg)       History of Present Illness: Sophia Ward is a 87 y.o. female  Withh/o CAD. She had an LAD stent placed in 2004 (drug-eluting stent for restenosis).  Her PLavix was stopped due to anemiain the past. Plavix was restartedlaterafter anemia was stable.  She haschronic back problems and has received injections in the past.  She has been intolerant of metformin in the pastdue to diarrhea.  In [name] 2018, she was in the hospital in [place] and was diagnosed with PMR. She was later found to have rheumatoid arthritis. MTX was started. Prednisone is being tapered gradually.  She has chronic back pain treated with injections. She travels to [place] in the winter time.   In May 2019, she had a PMR flare. She is on chronic Prednisone.   Hospital recordsfrom 5/21showed: "Symptomatic microcytic anemia Significant drop in hemoglobin to around 7 noted from her usual baseline of 12-13. Patient was transfused 1 unit of blood due to her symptoms. She did notice dark to black-colored stool recently. Denies any fresh red blood per rectum. She is on Plavix for her heart disease. Last colonoscopy was in 2014. No concerning findings were noted per patient. Anemia panel shows iron deficiency. CT abdomen showed diverticulosis. No other concerning findings noted. Patient seen by gastroenterology. Initially plan was for upper endoscopy on 5/27 however due to electrolyte abnormalities and new onset of atrial fibrillation this was postponed to the following day. She underwent upper endoscopy. No bleeding lesions were noted. White speckled mucosa was noted in the  esophagus. Subsequently she underwent colonoscopy. Few ulcers were noted in the terminal ileum. No active bleeding was noted. Cleared by gastroenterology to start anticoagulation. Follow-up in their office in 2 to 4 weeks. PPI.  Paroxysmal atrial fibrillation This is a new diagnosis for her. She has had palpitations on and off for the past 2 months. TSH was 2.15. Echocardiogram shows normal systolic function. Trivial mitral regurgitation noted. However more significantly has severe aortic stenosis noted.  Patient was started back on her beta-blocker. She converted to sinus rhythm. Electrolytes have been corrected. Her chads 2 vascular score is 6. She is high risk for thromboembolism. Anticoagulation was discussed with her. She is agreeable to start Eliquis if GI has no objection.Patient was started on Eliquis. All of this was also communicated to her primary cardiologist, Dr. Irish Lack.  Severe aortic stenosis She had moderate aortic stenosis when echocardiogram was done in 2019. Seems to have progressedbased on echocardiogram done during this hospital stay.. Now she also has been found to have atrial fibrillation as discussed above. She will need to see her primary cardiologist, Dr. Irish Lack, in the outpatient setting. Discussed with him. He agrees with plans for anticoagulation. Patient will need repeat echocardiogram in the next few months."  Hospitalized with pneumonia in 12/2018.  Plan was for left heart catheterization once she recovered from pneumonia.  Hgb stayed in the 9 range.  Cath in 01/2020 showed:  "Ost RCA to Prox RCA lesion is 75% stenosed.  A drug-eluting stent was successfully placed using a SYNERGY XD 3.50X16, postdilated to > 4 mm.  Post  intervention, there is a 0% residual stenosis.  Dist LM to Ost LAD lesion is 25% stenosed. Ost LAD to Prox LAD lesion is 75% stenosed.  After orbital atherectomy, a drug-eluting stent was successfully placed using  a SYNERGY XD 3.0X24, postdilated to 3.4 in the mid vessel and > 4 mm in the proximal/ostial LAD and left main. Stent optimized with IVUS.  Post intervention, there is a 0% residual stenosis.  Ost Cx to Prox Cx lesion is 75% stenosed, after being jailed by the stent.  Balloon angioplasty was performed using a BALLOON SAPPHIRE 2.0X12.  Post intervention, there is a 10% residual stenosis.   Successful PCI of the ostial RCA, followed by the left main/LADContinue dual antiplatelet therapy.  Struts to circumflex opened with small balloon as well since the circumflex was jailed."  TAVR in Mar 23, 2023.  Her husband passed away in 19-Sep-2019 of a stroke.  She found him at home on the floor and called 911.  He went to the hospital but ultimately passed away a few days later in hospice care.  Walking is limited by unsteadiness.  She will walk for 15-20 minutes at a time.  Denies : Chest pain. Dizziness. Leg edema. Nitroglycerin use. Orthopnea. Palpitations. Paroxysmal nocturnal dyspnea. Shortness of breath. Syncope.     Past Medical History:  Diagnosis Date  . Allergic rhinitis   . Anemia 09/2019  . Arthritis    hands  . BPPV (benign paroxysmal positional vertigo)   . Coronary artery disease   . Diabetes mellitus without complication ([place])   . Essential hypertension, benign 01/30/2014  . GERD (gastroesophageal reflux disease)   . Hypertension   . Lesion of pancreas   . Mixed hyperlipidemia 01/30/2014  . Myocardial infarction ([place]) 2004   mild MI-no damage- had stents  . Osteopenia   . Post-menopausal   . Pulmonary nodules   . Rheumatoid arthritis ([place])   . S/P TAVR (transcatheter aortic valve replacement) 03/03/2020   s/p TAVR with a 23 mm Edwards S3U via the TF approach by Drs Burt Knack and Roxy Manns  . Severe aortic stenosis   . Spinal stenosis   . Stenosing tenosynovitis     Past Surgical History:  Procedure Laterality Date  . ABDOMINAL HYSTERECTOMY    . APPENDECTOMY    . BIOPSY  10/25/2019    Procedure: BIOPSY;  Surgeon: Otis Brace, MD;  Location: [facility];  Service: Gastroenterology;;  . BIOPSY  10/26/2019   Procedure: BIOPSY;  Surgeon: Otis Brace, MD;  Location: [facility];  Service: Gastroenterology;;  . CARDIAC CATHETERIZATION  2004  . carpel tunnel    . COLONOSCOPY WITH PROPOFOL N/A 10/30/2012   Procedure: COLONOSCOPY WITH PROPOFOL;  Surgeon: Garlan Fair, MD;  Location: [facility];  Service: Endoscopy;  Laterality: N/A;  . COLONOSCOPY WITH PROPOFOL N/A 10/26/2019   Procedure: COLONOSCOPY WITH PROPOFOL;  Surgeon: Otis Brace, MD;  Location: [facility];  Service: Gastroenterology;  Laterality: N/A;  . CORONARY ANGIOPLASTY  2004  . CORONARY ATHERECTOMY N/A 02/21/2020   Procedure: CORONARY ATHERECTOMY;  Surgeon: Jettie Booze, MD;  Location: [facility];  Service: Cardiovascular;  Laterality: N/A;  . CORONARY STENT INTERVENTION N/A 02/21/2020   Procedure: CORONARY STENT INTERVENTION;  Surgeon: Jettie Booze, MD;  Location: [facility];  Service: Cardiovascular;  Laterality: N/A;  . DILATION AND CURETTAGE OF UTERUS    . ESOPHAGOGASTRODUODENOSCOPY N/A 10/25/2019   Procedure: ESOPHAGOGASTRODUODENOSCOPY (EGD);  Surgeon: Otis Brace, MD;  Location[facility];  Service: Gastroenterology;  Laterality: N/A;  . ESOPHAGOGASTRODUODENOSCOPY (EGD) WITH PROPOFOL N/A 10/30/2012   Procedure: ESOPHAGOGASTRODUODENOSCOPY (EGD) WITH PROPOFOL;  Surgeon: Garlan Fair, MD;  Location: [facility];  Service: Endoscopy;  Laterality: N/A;  . EYE SURGERY     bilateral cataract with lens implant  . EYE SURGERY    . INJECTION OF SILICONE OIL Left 02/20/2682   Procedure: Injection Of Silicone Oil;  Surgeon: Jalene Mullet, MD;  Location: [facility];  Service: Ophthalmology;  Laterality: Left;  . INTRAVASCULAR PRESSURE WIRE/FFR STUDY N/A 01/29/2020   Procedure: INTRAVASCULAR PRESSURE WIRE/FFR STUDY;  Surgeon: Jettie Booze, MD;  Location:  [facility];  Service: Cardiovascular;  Laterality: N/A;  . INTRAVASCULAR ULTRASOUND/IVUS N/A 02/21/2020   Procedure: Intravascular Ultrasound/IVUS;  Surgeon: Jettie Booze, MD;  Location: [facility];  Service: Cardiovascular;  Laterality: N/A;  . left shouler surgery    . PHOTOCOAGULATION WITH LASER Left 02/12/2019   Procedure: Photocoagulation With Laser;  Surgeon: Jalene Mullet, MD;  Location: [facility];  Service: Ophthalmology;  Laterality: Left;  . 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 02/12/2019   Procedure: REPAIR OF COMPLEX TRACTION RETINAL DETACHMENT;  Surgeon: Jalene Mullet, MD;  Location: [facility];  Service: Ophthalmology;  Laterality: Left;  . RIGHT/LEFT HEART CATH AND CORONARY ANGIOGRAPHY N/A 01/29/2020   Procedure: RIGHT/LEFT HEART CATH AND CORONARY ANGIOGRAPHY;  Surgeon: Jettie Booze, MD;  Location: [facility];  Service: Cardiovascular;  Laterality: N/A;  . TEE WITHOUT CARDIOVERSION N/A 03/03/2020   Procedure: TRANSESOPHAGEAL ECHOCARDIOGRAM (TEE);  Surgeon: Sherren Mocha, MD;  Location: [facility];  Service: Open Heart Surgery;  Laterality: N/A;  . TONSILLECTOMY    . TRANSCATHETER AORTIC VALVE REPLACEMENT, TRANSFEMORAL  03/03/2020  . TRANSCATHETER AORTIC VALVE REPLACEMENT, TRANSFEMORAL N/A 03/03/2020   Procedure: TRANSCATHETER AORTIC VALVE REPLACEMENT, TRANSFEMORAL USING EDWARDS SAPIEN 3 23 MM AORTIC VALVE.;  Surgeon: Sherren Mocha, MD;  Location: [facility];  Service: Open Heart Surgery;  Laterality: N/A;  . VITRECTOMY 25 GAUGE WITH SCLERAL BUCKLE Left 02/12/2019   Procedure: Vitrectomy 25 Gauge With Scleral Buckle;  Surgeon: Jalene Mullet, MD;  Location: [facility];  Service: Ophthalmology;  Laterality: Left;     Current Outpatient Medications  Medication Sig Dispense Refill  . amLODipine (NORVASC) 10 MG tablet Take 1 tablet (10 mg total) by mouth every morning. 90 tablet 3  . apixaban (ELIQUIS) 5 MG TABS tablet Take 1 tablet (5 mg total) by  mouth 2 (two) times daily. 60 tablet   . atorvastatin (LIPITOR) 40 MG tablet Take 1 tablet (40 mg total) by mouth at bedtime. 90 tablet 3  . azithromycin (ZITHROMAX) 500 MG tablet Take 1 tablet (500 mg total) by mouth as directed. One tablet by mouth 60 MINUTES before any dental appointment 3 tablet 2  . B-D ULTRAFINE III SHORT PEN 31G X 8 MM MISC 1 each by Other route in the morning, at noon, in the evening, and at bedtime.     . Calcium Carbonate-Vitamin D (CALCIUM + D PO) Take 1 tablet by mouth daily.     . Cholecalciferol (VITAMIN D3) 125 MCG (5000 UT) CAPS Take 5,000 Units by mouth daily.     . clopidogrel (PLAVIX) 75 MG tablet Take 4 tablets (300 mg) by mouth this evening, then take 1 tablet (75 mg) by mouth daily (Patient taking differently: Take 75 mg by mouth daily. Take 4 tablets (300 mg) by mouth this evening, then take 1 tablet (75 mg) by mouth daily) 90 tablet 0  .  desonide (DESOWEN) 0.05 % cream Apply 1 application topically 2 (two) times daily as needed.    Marland Kitchen escitalopram (LEXAPRO) 5 MG tablet Take 10 mg by mouth daily.    . ferrous sulfate 325 (65 FE) MG tablet Take 1 tablet (325 mg total) by mouth 2 (two) times daily with a meal. 60 tablet 1  . folic acid (FOLVITE) 1 MG tablet Take 1 tablet (1 mg total) by mouth daily. 180 tablet 1  . gabapentin (NEURONTIN) 100 MG capsule TAKE 1 TO 2 CAPSULES BY MOUTH ONCE DAILY AT BEDTIME AS NEEDED    . insulin lispro (HUMALOG) 100 UNIT/ML KiwkPen Inject 6 Units into the skin 3 (three) times daily.     Marland Kitchen LEVEMIR FLEXTOUCH 100 UNIT/ML Pen Inject 14 Units into the skin every morning.   0  . methotrexate 2.5 MG tablet TAKE 5 TABLETS BY MOUTH ONCE A WEEK 60 tablet 0  . metoprolol succinate (TOPROL-XL) 100 MG 24 hr tablet Take 1 tablet (100 mg total) by mouth every morning. 90 tablet 3  . nitroGLYCERIN (NITROSTAT) 0.4 MG SL tablet DISSOLVE ONE TABLET UNDER THE TONGUE EVERY 5 MINUTES AS NEEDED FOR CHEST PAIN.  DO NOT EXCEED A TOTAL OF 3 DOSES IN 15  MINUTES 25 tablet 5  . Omega-3 Fatty Acids (FISH OIL PO) Take 1 capsule by mouth daily.     . ondansetron (ZOFRAN ODT) 4 MG disintegrating tablet Take 1 tablet (4 mg total) by mouth every 8 (eight) hours as needed for nausea or vomiting. 20 tablet 0  . pantoprazole (PROTONIX) 40 MG tablet Take 1 tablet (40 mg total) by mouth daily after lunch. 30 tablet 1  . predniSONE (DELTASONE) 1 MG tablet Take 3 tablets (3 mg total) by mouth daily. 90 tablet 0  . ramipril (ALTACE) 10 MG tablet Take 10 mg by mouth 2 (two) times daily.      No current facility-administered medications for this visit.    Allergies:   Codeine, Glimepiride, Jardiance [empagliflozin], Metformin and related, and Penicillins    Social History:  The patient  reports that she has never smoked. She has never used smokeless tobacco. She reports that she does not drink alcohol and does not use drugs.   Family History:  The patient's family history includes Arthritis in her daughter; CAD in her father and mother; Heart Problems in her brother; Heart attack in her mother.    ROS:  Please see the history of present illness.   Otherwise, review of systems are positive for back pain.   All other systems are reviewed and negative.    PHYSICAL EXAM: VS:  BP 134/64   Pulse 63   Ht 5' 2.5" (1.588 m)   Wt 167 lb (75.8 kg)   SpO2 91%   BMI 30.06 kg/m  , BMI Body mass index is 30.06 kg/m. GEN: Well nourished, well developed, in no acute distress  HEENT: normal  Neck: no JVD, carotid bruits, or masses Cardiac: RRR; no murmurs, rubs, or gallops,no edema  Respiratory:  clear to auscultation bilaterally, normal work of breathing GI: soft, nontender, nondistended, + BS MS: no deformity or atrophy  Skin: warm and dry, no rash Neuro:  Strength and sensation are intact Psych: euthymic mood, full affect   EKG:   The ekg ordered today demonstrates normal sinus rhythm, normal intervals, no ST segment changes   Recent Labs: 01/13/2020:  TSH 1.118 02/28/2020: B Natriuretic Peptide 427.7 03/04/2020: Magnesium 1.5 10/21/2020: ALT 9; BUN 21; Creatinine 1.06;  Hemoglobin 11.8; Platelets 293; Potassium 4.6; Sodium 140   Lipid Panel    Component Value Date/Time   CHOL 121 02/25/2020 0223   TRIG 76 02/25/2020 0223   HDL 49 02/25/2020 0223   CHOLHDL 2.5 02/25/2020 0223   VLDL 15 02/25/2020 0223   LDLCALC 57 02/25/2020 0223     Other studies Reviewed: Additional studies/ records that were reviewed today with results demonstrating: Hemoglobin 11.8 at last check.   ASSESSMENT AND PLAN:  1.   CAD: No angina.  Continue aggressive secondary prevention.  Continue clopidogrel. 2.   Status post TAVR: Continue SBE prophylaxis. 3.   Chronic diastolic heart failure: Appeard euvolemic.   4.   Preoperative cardiovascular examination: Needs to be off of Eliquis for 3 days prior to spine injection.  Okay to hold clopidogrel 5 to 7 days prior to spine injection.  Would restart her clopidogrel the next day as it will take several days for her to get a therapeutic effect.  Will likely need to hold Eliquis at least 2 days after the injection since it has a much quicker onset of action. 5.   Anticoagulated: We discussed long-term anticoagulation and stroke risk. PAF: noted in 2019.  In NSR.  Eliquis for stroke prevention.  If she were to ever develop a bleeding issue, we will have to reassess her anticoagulation.  Given her lack of A. fib symptoms, I would likely stop her Eliquis first if she were to have a bleeding problem.  Clopidogrel is more for her left main/LAD and ostial RCA stents. Avoid falls.  At some point, she may need to use a cane.  We discussed trying water aerobics for a stationary bike.  Current medicines are reviewed at length with the patient today.  The patient concerns regarding her medicines were addressed.  The following changes have been made:  No change  Labs/ tests ordered today include:  No orders of the defined types  were placed in this encounter.   Recommend 150 minutes/week of aerobic exercise Low fat, low carb, high fiber diet recommended  Disposition:   FU in 6 months   Signed, Larae Grooms, MD  11/04/2020 8:48 AM    [facility] [address], [place], [place]  [address] Phone: [phone]; Fax: [phone]

## 2020-11-04 ENCOUNTER — Other Ambulatory Visit: Payer: Self-pay

## 2020-11-04 ENCOUNTER — Ambulatory Visit (INDEPENDENT_AMBULATORY_CARE_PROVIDER_SITE_OTHER): Payer: Medicare Other | Admitting: Interventional Cardiology

## 2020-11-04 ENCOUNTER — Encounter: Payer: Self-pay | Admitting: Interventional Cardiology

## 2020-11-04 VITALS — BP 134/64 | HR 63 | Ht 62.5 in | Wt 167.0 lb

## 2020-11-04 DIAGNOSIS — I5032 Chronic diastolic (congestive) heart failure: Secondary | ICD-10-CM

## 2020-11-04 DIAGNOSIS — I2510 Atherosclerotic heart disease of native coronary artery without angina pectoris: Secondary | ICD-10-CM

## 2020-11-04 DIAGNOSIS — I480 Paroxysmal atrial fibrillation: Secondary | ICD-10-CM | POA: Diagnosis not present

## 2020-11-04 DIAGNOSIS — Z952 Presence of prosthetic heart valve: Secondary | ICD-10-CM | POA: Diagnosis not present

## 2020-11-04 MED ORDER — NITROGLYCERIN 0.4 MG SL SUBL: SUBLINGUAL_TABLET | SUBLINGUAL | 5 refills | Status: AC

## 2020-11-04 NOTE — Patient Instructions (Signed)
Medication Instructions:  Your physician recommends that you continue on your current medications as directed. Please refer to the Current Medication list given to you today.  *If you need a refill on your cardiac medications before your next appointment, please call your pharmacy*   Lab Work: none If you have labs (blood work) drawn today and your tests are completely normal, you will receive your results only by: Marland Kitchen MyChart Message (if you have MyChart) OR . A paper copy in the mail If you have any lab test that is abnormal or we need to change your treatment, we will call you to review the results.   Testing/Procedures: none   Follow-Up: At [facility], you and your health needs are our priority.  As part of our continuing mission to provide you with exceptional heart care, we have created designated Provider Care Teams.  These Care Teams include your primary Cardiologist (physician) and Advanced Practice Providers (APPs -  Physician Assistants and Nurse Practitioners) who all work together to provide you with the care you need, when you need it.  We recommend signing up for the patient portal called "MyChart".  Sign up information is provided on this After Visit Summary.  MyChart is used to connect with patients for Virtual Visits (Telemedicine).  Patients are able to view lab/test results, encounter notes, upcoming appointments, etc.  Non-urgent messages can be sent to your provider as well.   To learn more about what you can do with MyChart, go to [email or web address].    Your next appointment:   6 month(s)--December 31,4970 at 9:20  The format for your next appointment:   In Person  Provider:   Casandra Doffing, MD   Other Instructions

## 2020-11-09 ENCOUNTER — Other Ambulatory Visit: Payer: Self-pay | Admitting: Rheumatology

## 2020-11-09 MED ORDER — PREDNISONE 1 MG PO TABS: 3.0000 mg | ORAL_TABLET | Freq: Every day | ORAL | 0 refills | Status: DC

## 2020-11-09 NOTE — Telephone Encounter (Signed)
Next Visit: 03/19/2021  Last Visit: 10/16/2020  Last Fill: 10/16/2020  Dx: Rheumatoid arthritis involving multiple sites with positive rheumatoid factor  Current Dose per office note on 10/16/2020,  she is unable to taper prednisone below 4 mg, I called patient, patient states she is taking 3  x 1 mg daily.  Okay to refill prednisone?

## 2020-11-09 NOTE — Telephone Encounter (Signed)
Patient request refill on Prednisone 1 mg, sent to [facility]. (Per patient, different pharmacy then last time.)

## 2020-11-19 DIAGNOSIS — M48062 Spinal stenosis, lumbar region with neurogenic claudication: Secondary | ICD-10-CM | POA: Diagnosis not present

## 2020-11-25 IMAGING — CR DG CHEST 2V
2 series · 2 of 2 positions shown · non-contrast
Comparison: 02/07/2020 chest radiograph.

CLINICAL DATA: Cardiac surgery preoperative evaluation.

EXAM:
CHEST - 2 VIEW

[w chest pa]
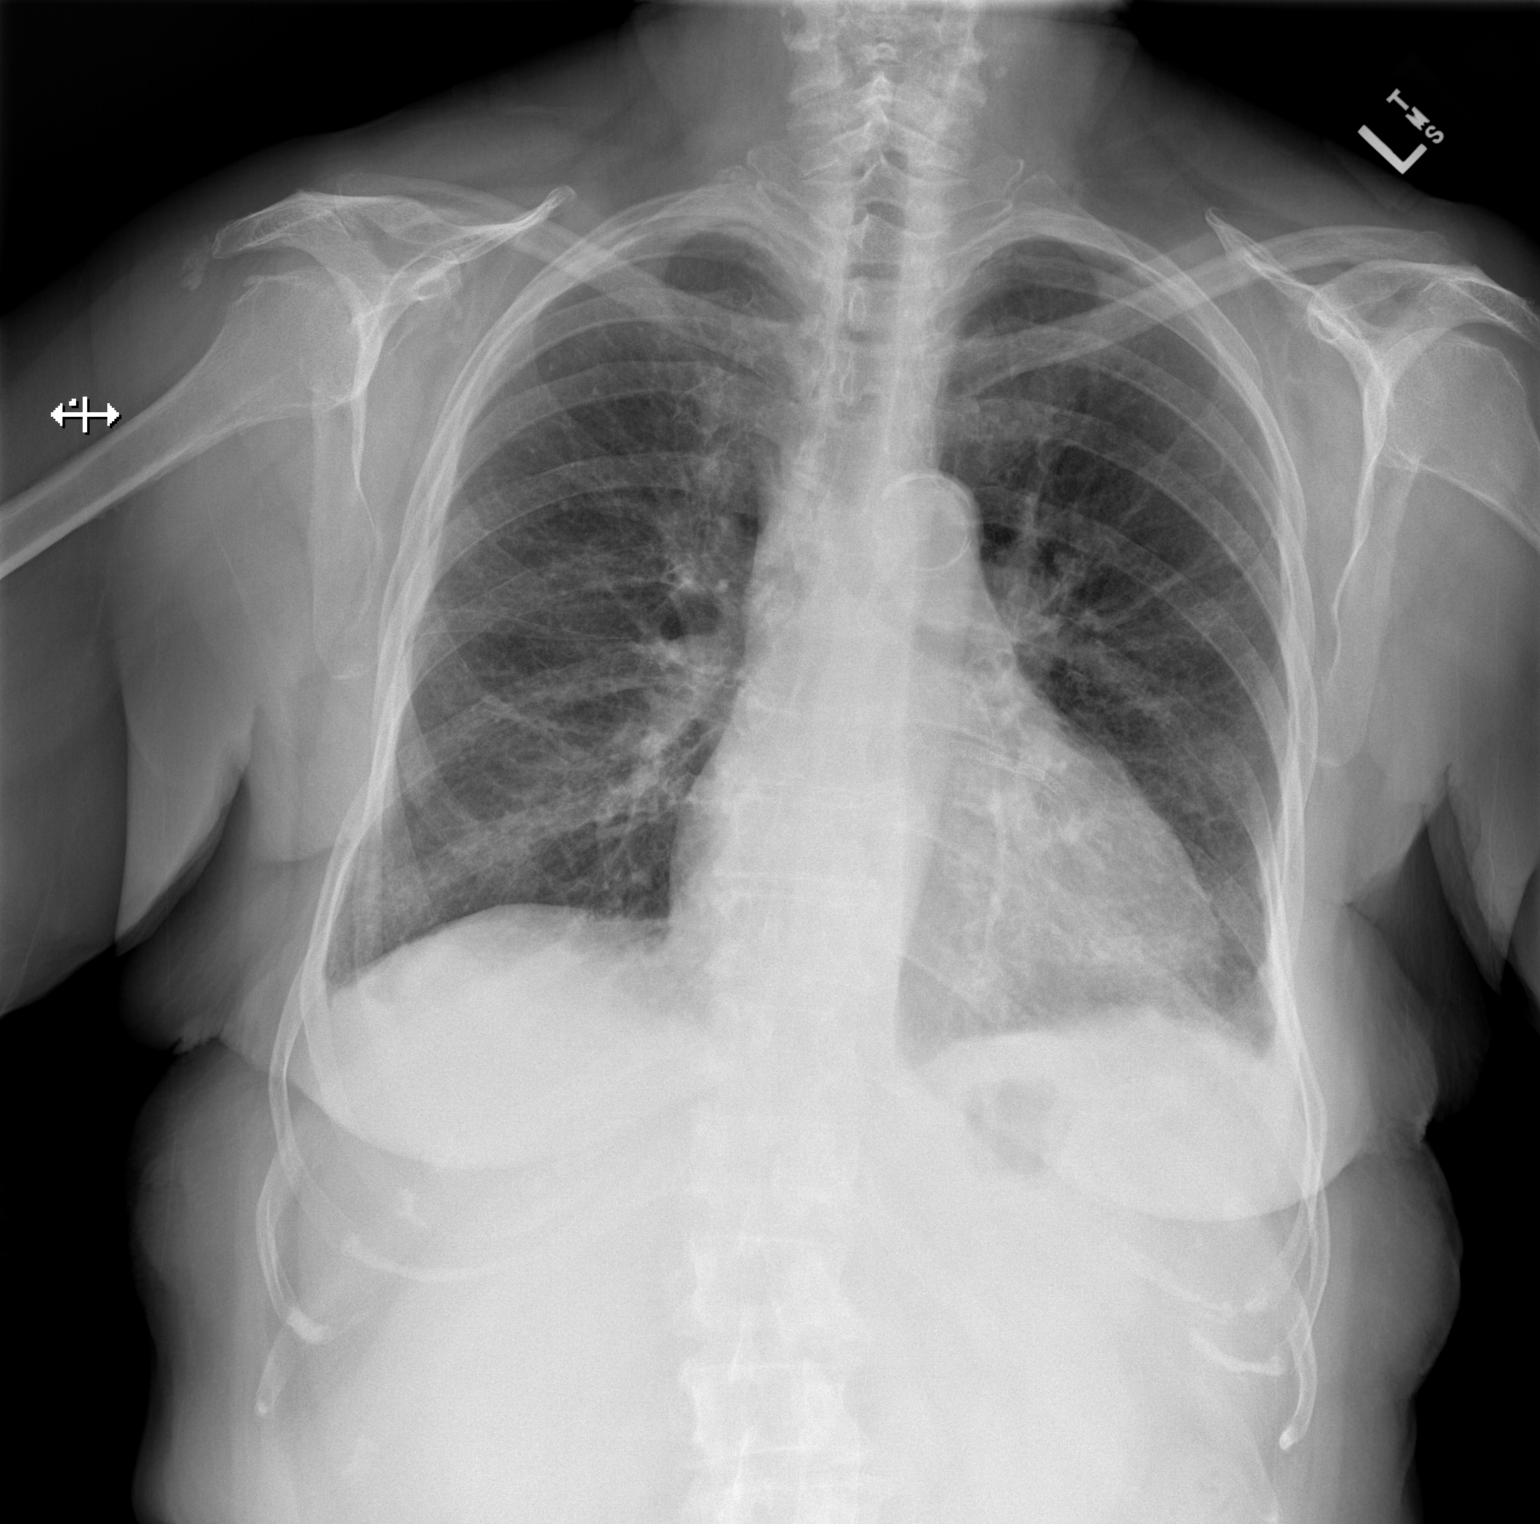

[w chest lat]
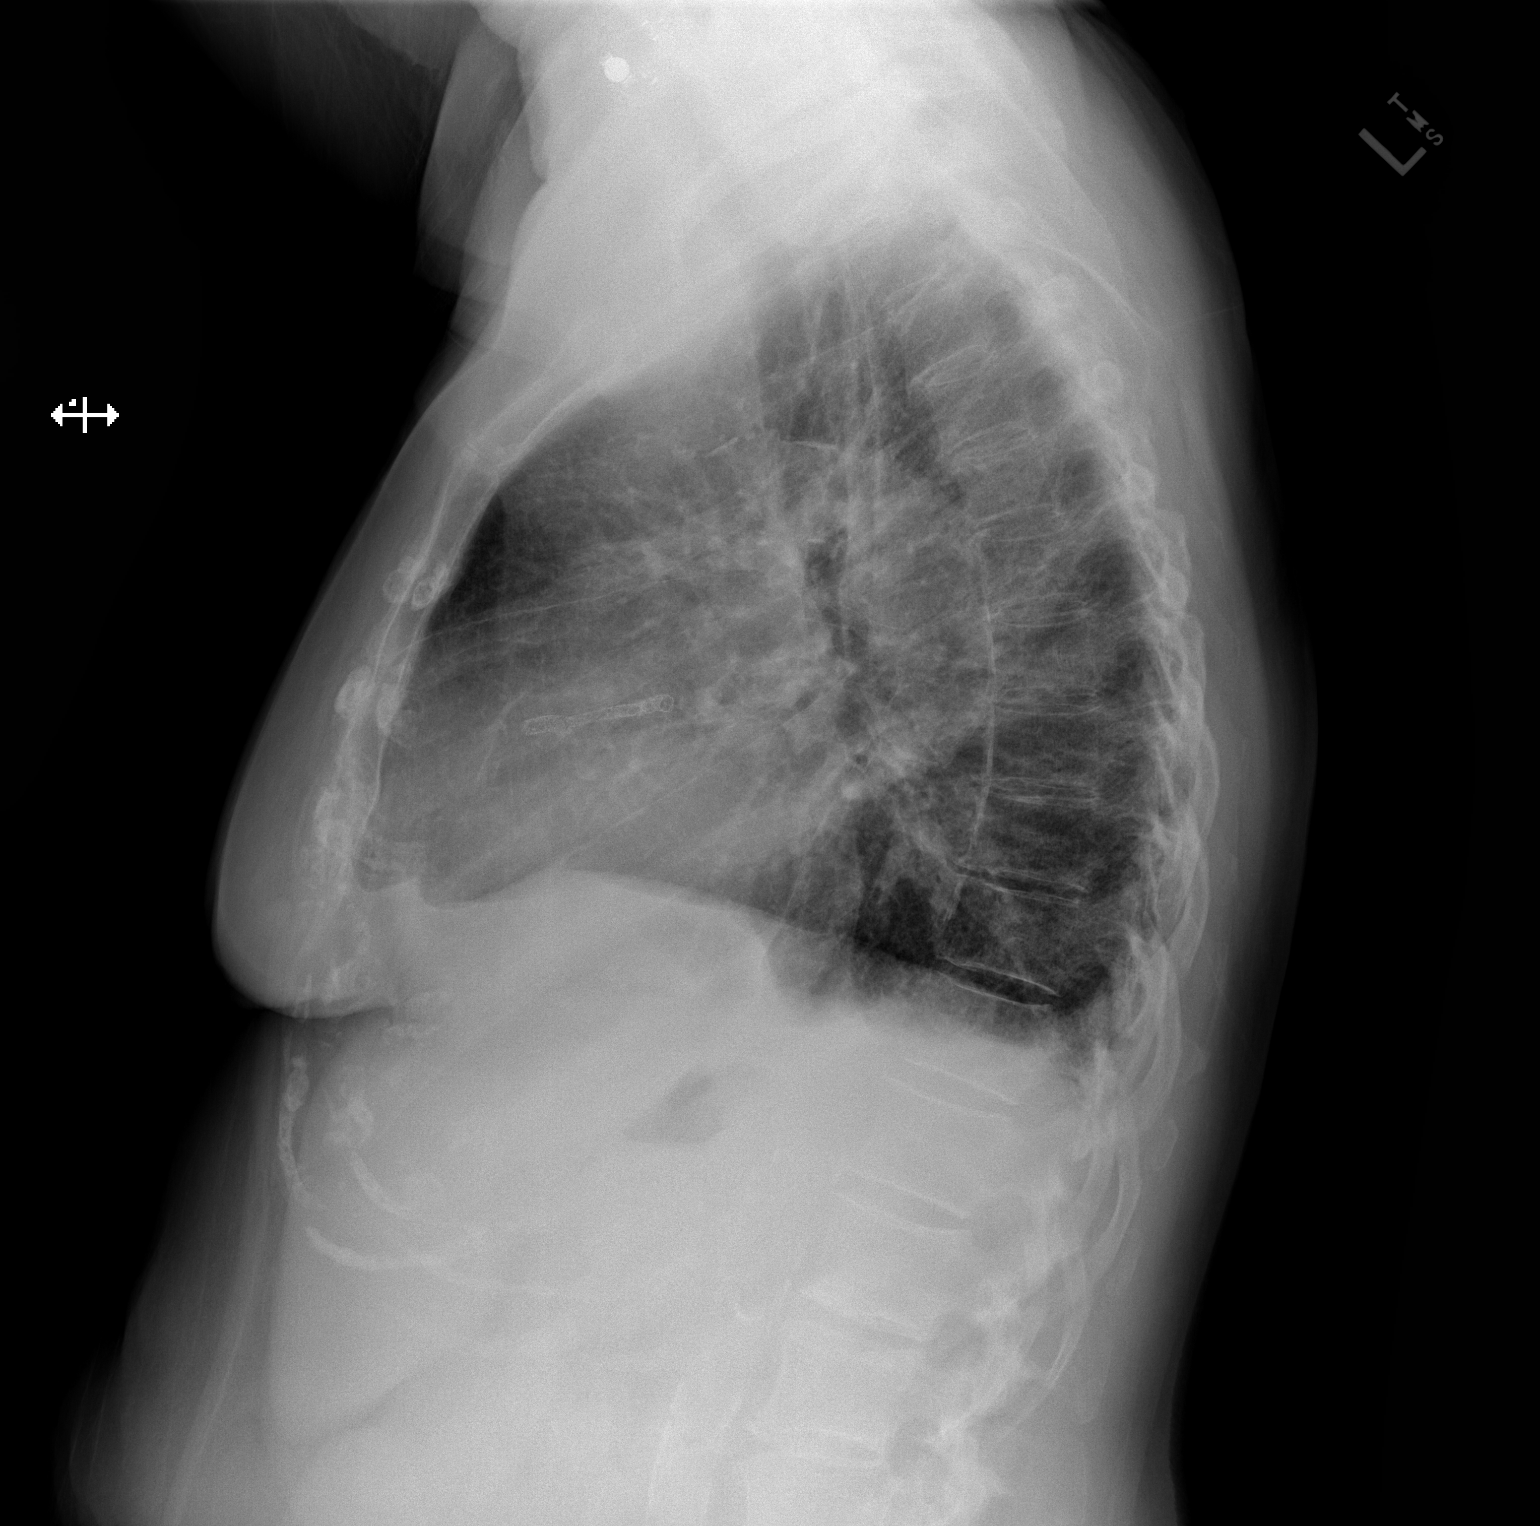

[2 of 2 positions shown; findings below may reference images not displayed]

FINDINGS: Stable cardiomediastinal silhouette with mild cardiomegaly. No
pneumothorax. Trace bilateral pleural effusions. Cephalization of
the pulmonary vasculature without overt pulmonary edema. No acute
consolidative airspace disease.
IMPRESSION: 1. Stable mild cardiomegaly without overt pulmonary edema.
2. Trace bilateral pleural effusions.

## 2020-11-28 DIAGNOSIS — B029 Zoster without complications: Secondary | ICD-10-CM | POA: Diagnosis not present

## 2020-12-07 ENCOUNTER — Other Ambulatory Visit: Payer: Self-pay | Admitting: Physician Assistant

## 2020-12-07 ENCOUNTER — Other Ambulatory Visit: Payer: Self-pay | Admitting: Interventional Cardiology

## 2020-12-07 NOTE — Telephone Encounter (Signed)
Eliquis 5mg  refill request received. Patient is 87 years old, weight-75.8kg, Crea-1.06 on 10/21/2020, Diagnosis-Afib, and last seen by Dr. Irish Lack on 11/04/2020. Dose is appropriate based on dosing criteria. Will send in refill to requested pharmacy.

## 2020-12-07 NOTE — Telephone Encounter (Signed)
Next Visit: 03/19/2021   Last Visit: 10/16/2020   Last Fill: 10/16/2020   Dx: Rheumatoid arthritis involving multiple sites with positive rheumatoid factor   Current Dose per office note on 10/16/2020,  she is unable to taper prednisone below 4 mg. Spoke with patient and she verified she is taking Prednisone 3 mg daily.   Okay to refill Prednisone?

## 2021-01-01 ENCOUNTER — Other Ambulatory Visit: Payer: Self-pay | Admitting: Physician Assistant

## 2021-01-01 DIAGNOSIS — Z952 Presence of prosthetic heart valve: Secondary | ICD-10-CM

## 2021-01-05 ENCOUNTER — Other Ambulatory Visit: Payer: Self-pay

## 2021-01-05 MED ORDER — PREDNISONE 1 MG PO TABS: 3.0000 mg | ORAL_TABLET | Freq: Every day | ORAL | 0 refills | Status: DC

## 2021-01-05 MED ORDER — METHOTREXATE SODIUM 2.5 MG PO TABS: ORAL_TABLET | ORAL | 0 refills | Status: DC

## 2021-01-05 NOTE — Telephone Encounter (Signed)
Patient called requesting prescription refills of Prednisone 1 mg and Methotrexate 2.5 mg.  Patient states she needs both prescriptions to go to [facility]  in [place].  Patient is also asking if she can get 3 month supply for her Prednisone.

## 2021-01-05 NOTE — Telephone Encounter (Signed)
Next Visit: 03/19/2021   Last Visit: 10/16/2020   Last Fill: 10/16/2020 MTX, Prednisone 12/07/2020 (30 day supply)   Dx: Rheumatoid arthritis involving multiple sites with positive rheumatoid factor   Current Dose per office note on 10/16/2020,  she is unable to taper prednisone below 4 mg. Spoke with patient and she verified she is taking Prednisone 3 mg daily. Methotrexate 2.5 mg 5 tablet every 7 days  Labs: 10/21/2020 WBC 13.0, RBC 3.76, Hgb 11.8, Hct 35.6, Neutro Abs 9.8, Monocytes Absolute 1.6, Glucose 61, Creat. 1.06, GFR 51, AST 14  Okay to refill MTX and Prednisone?

## 2021-01-12 DIAGNOSIS — D0462 Carcinoma in situ of skin of left upper limb, including shoulder: Secondary | ICD-10-CM | POA: Diagnosis not present

## 2021-01-12 DIAGNOSIS — L570 Actinic keratosis: Secondary | ICD-10-CM | POA: Diagnosis not present

## 2021-01-12 DIAGNOSIS — Z85828 Personal history of other malignant neoplasm of skin: Secondary | ICD-10-CM | POA: Diagnosis not present

## 2021-01-12 DIAGNOSIS — D485 Neoplasm of uncertain behavior of skin: Secondary | ICD-10-CM | POA: Diagnosis not present

## 2021-02-03 ENCOUNTER — Other Ambulatory Visit: Payer: Self-pay | Admitting: Interventional Cardiology

## 2021-02-09 DIAGNOSIS — M48062 Spinal stenosis, lumbar region with neurogenic claudication: Secondary | ICD-10-CM | POA: Diagnosis not present

## 2021-02-12 DIAGNOSIS — Z23 Encounter for immunization: Secondary | ICD-10-CM | POA: Diagnosis not present

## 2021-02-12 DIAGNOSIS — N183 Chronic kidney disease, stage 3 unspecified: Secondary | ICD-10-CM | POA: Diagnosis not present

## 2021-02-12 DIAGNOSIS — Z794 Long term (current) use of insulin: Secondary | ICD-10-CM | POA: Diagnosis not present

## 2021-02-12 DIAGNOSIS — M353 Polymyalgia rheumatica: Secondary | ICD-10-CM | POA: Diagnosis not present

## 2021-02-12 DIAGNOSIS — M069 Rheumatoid arthritis, unspecified: Secondary | ICD-10-CM | POA: Diagnosis not present

## 2021-02-12 DIAGNOSIS — E1122 Type 2 diabetes mellitus with diabetic chronic kidney disease: Secondary | ICD-10-CM | POA: Diagnosis not present

## 2021-02-12 DIAGNOSIS — D649 Anemia, unspecified: Secondary | ICD-10-CM | POA: Diagnosis not present

## 2021-02-12 DIAGNOSIS — I480 Paroxysmal atrial fibrillation: Secondary | ICD-10-CM | POA: Diagnosis not present

## 2021-02-12 DIAGNOSIS — M48061 Spinal stenosis, lumbar region without neurogenic claudication: Secondary | ICD-10-CM | POA: Diagnosis not present

## 2021-02-12 DIAGNOSIS — I10 Essential (primary) hypertension: Secondary | ICD-10-CM | POA: Diagnosis not present

## 2021-02-12 DIAGNOSIS — D508 Other iron deficiency anemias: Secondary | ICD-10-CM | POA: Diagnosis not present

## 2021-02-12 DIAGNOSIS — I5030 Unspecified diastolic (congestive) heart failure: Secondary | ICD-10-CM | POA: Diagnosis not present

## 2021-02-19 DIAGNOSIS — Z23 Encounter for immunization: Secondary | ICD-10-CM | POA: Diagnosis not present

## 2021-03-02 DIAGNOSIS — Z03818 Encounter for observation for suspected exposure to other biological agents ruled out: Secondary | ICD-10-CM | POA: Diagnosis not present

## 2021-03-02 DIAGNOSIS — R059 Cough, unspecified: Secondary | ICD-10-CM | POA: Diagnosis not present

## 2021-03-05 NOTE — Progress Notes (Signed)
Office Visit Note  Patient: Sophia Ward             Date of Birth: 03/03/1933           MRN: [id number]             PCP: [name], MD Referring: [name], MD Visit Date: 03/19/2021 Occupation: @GUAROCC @  Subjective:  Medication management   History of Present Illness: Sophia Ward is a 87 y.o. female with a history of rheumatoid arthritis, osteoarthritis, polymyalgia rheumatica and degenerative disc disease.  She states she is doing quite well on the combination of methotrexate and low-dose prednisone.  She has been tapering prednisone and has been on 3 mg of prednisone for a while which she wants to stay on.  She states about 1 month ago she started having severe lower back pain and difficulty walking.  She was using a walker.  She was evaluated by Dr. Mina Marble and then had cortisone injection in her spine for spinal stenosis according to patient.  She states that relieved her symptoms and she is doing much better now.  She denies any joint swelling today.  Activities of Daily Living:  Patient reports morning stiffness for 3 hours.   Patient Denies nocturnal pain.  Difficulty dressing/grooming: Denies Difficulty climbing stairs: Denies Difficulty getting out of chair: Reports Difficulty using hands for taps, buttons, cutlery, and/or writing: Denies  Review of Systems  Constitutional:  Positive for fatigue. Negative for night sweats, weight gain and weight loss.  HENT:  Positive for mouth dryness. Negative for mouth sores, trouble swallowing, trouble swallowing and nose dryness.   Eyes:  Positive for dryness. Negative for pain, redness and visual disturbance.  Respiratory:  Negative for cough, shortness of breath and difficulty breathing.   Cardiovascular:  Negative for chest pain, palpitations, hypertension, irregular heartbeat and swelling in legs/feet.  Gastrointestinal:  Positive for diarrhea. Negative for blood in stool and constipation.  Endocrine:  Positive for cold intolerance. Negative for increased urination.  Genitourinary:  Negative for difficulty urinating and vaginal dryness.  Musculoskeletal:  Positive for gait problem and morning stiffness. Negative for joint pain, joint pain, joint swelling, myalgias, muscle weakness, muscle tenderness and myalgias.  Skin:  Negative for color change, rash, hair loss, redness, skin tightness, ulcers and sensitivity to sunlight.  Allergic/Immunologic: Negative for susceptible to infections.  Neurological:  Positive for numbness and weakness. Negative for dizziness, memory loss and night sweats.  Hematological:  Positive for bruising/bleeding tendency. Negative for swollen glands.  Psychiatric/Behavioral:  Negative for depressed mood and sleep disturbance. The patient is not nervous/anxious.    PMFS History:  Patient Active Problem List   Diagnosis Date Noted   Acute on chronic diastolic heart failure ([place]) 03/04/2020   S/P TAVR (transcatheter aortic valve replacement) 03/03/2020   Diabetes mellitus without complication (HCC)    Pulmonary nodules    Lesion of pancreas    Severe aortic stenosis    Pressure injury of skin 01/15/2020   Iron deficiency anemia 01/06/2020   Symptomatic anemia 10/23/2019   Anemia 10/22/2019   Polymyalgia rheumatica ([place]) 11/25/2016   Primary osteoarthritis of both hands 11/25/2016   Bilateral primary osteoarthritis of knee 11/25/2016   Osteopenia of multiple sites 11/25/2016   Chronic gout involving toe without tophus 10/28/2016   Osteoarthritis of lumbar spine 10/28/2016   History of gastroesophageal reflux (GERD) 10/27/2016   Rheumatoid arthritis involving multiple sites with positive rheumatoid factor ([place]) 10/14/2016   Coronary atherosclerosis of native coronary  artery 01/30/2014   Mixed hyperlipidemia 01/30/2014   Essential hypertension, benign 01/30/2014   Obesity, unspecified 01/30/2014    Past Medical History:  Diagnosis Date   Allergic rhinitis     Anemia 09/2019   Arthritis    hands   BPPV (benign paroxysmal positional vertigo)    Coronary artery disease    Diabetes mellitus without complication ([place])    Essential hypertension, benign 01/30/2014   GERD (gastroesophageal reflux disease)    Hypertension    Lesion of pancreas    Mixed hyperlipidemia 01/30/2014   Myocardial infarction ([place]) 2004   mild MI-no damage- had stents   Osteopenia    Post-menopausal    Pulmonary nodules    Rheumatoid arthritis (HCC)    S/P TAVR (transcatheter aortic valve replacement) 03/03/2020   s/p TAVR with a 23 mm Edwards S3U via the TF approach by Drs Burt Knack and Roxy Manns   Severe aortic stenosis    Spinal stenosis    Stenosing tenosynovitis     Family History  Problem Relation Age of Onset   CAD Mother    Heart attack Mother    CAD Father    Heart Problems Brother        open heart surgery    Arthritis Daughter        psoriatic arthritis    Past Surgical History:  Procedure Laterality Date   ABDOMINAL HYSTERECTOMY     APPENDECTOMY     BIOPSY  10/25/2019   Procedure: BIOPSY;  Surgeon: Otis Brace, MD;  Location: [facility];  Service: Gastroenterology;;   BIOPSY  10/26/2019   Procedure: BIOPSY;  Surgeon: Otis Brace, MD;  Location: [facility];  Service: Gastroenterology;;   CARDIAC CATHETERIZATION  2004   carpel tunnel     COLONOSCOPY WITH PROPOFOL N/A 10/30/2012   Procedure: COLONOSCOPY WITH PROPOFOL;  Surgeon: Garlan Fair, MD;  Location: [facility];  Service: Endoscopy;  Laterality: N/A;   COLONOSCOPY WITH PROPOFOL N/A 10/26/2019   Procedure: COLONOSCOPY WITH PROPOFOL;  Surgeon: Otis Brace, MD;  Location: [facility];  Service: Gastroenterology;  Laterality: N/A;   CORONARY ANGIOPLASTY  2004   CORONARY ATHERECTOMY N/A 02/21/2020   Procedure: CORONARY ATHERECTOMY;  Surgeon: Jettie Booze, MD;  Location: [facility];  Service: Cardiovascular;  Laterality: N/A;   CORONARY STENT INTERVENTION N/A 02/21/2020    Procedure: CORONARY STENT INTERVENTION;  Surgeon: Jettie Booze, MD;  Location: [facility];  Service: Cardiovascular;  Laterality: N/A;   DILATION AND CURETTAGE OF UTERUS     ESOPHAGOGASTRODUODENOSCOPY N/A 10/25/2019   Procedure: ESOPHAGOGASTRODUODENOSCOPY (EGD);  Surgeon: Otis Brace, MD;  Location: [facility];  Service: Gastroenterology;  Laterality: N/A;   ESOPHAGOGASTRODUODENOSCOPY (EGD) WITH PROPOFOL N/A 10/30/2012   Procedure: ESOPHAGOGASTRODUODENOSCOPY (EGD) WITH PROPOFOL;  Surgeon: Garlan Fair, MD;  Location: [facility];  Service: Endoscopy;  Laterality: N/A;   EYE SURGERY     bilateral cataract with lens implant   EYE SURGERY     INJECTION OF SILICONE OIL Left 8/34/1962   Procedure: Injection Of Silicone Oil;  Surgeon: Jalene Mullet, MD;  Location: [facility];  Service: Ophthalmology;  Laterality: Left;   INTRAVASCULAR PRESSURE WIRE/FFR STUDY N/A 01/29/2020   Procedure: INTRAVASCULAR PRESSURE WIRE/FFR STUDY;  Surgeon: Jettie Booze, MD;  Location: [place] [facility];  Service: Cardiovascular;  Laterality: N/A;   INTRAVASCULAR ULTRASOUND/IVUS N/A 02/21/2020   Procedure: Intravascular Ultrasound/IVUS;  Surgeon: Jettie Booze, MD;  Location: [place];  Service: Cardiovascular;  Laterality:  N/A;   left shouler surgery     PHOTOCOAGULATION WITH LASER Left 02/12/2019   Procedure: Photocoagulation With Laser;  Surgeon: Jalene Mullet, MD;  Location: [facility];  Service: Ophthalmology;  Laterality: Left;   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 02/12/2019   Procedure: REPAIR OF COMPLEX TRACTION RETINAL DETACHMENT;  Surgeon: Jalene Mullet, MD;  Location: [facility];  Service: Ophthalmology;  Laterality: Left;   RIGHT/LEFT HEART CATH AND CORONARY ANGIOGRAPHY N/A 01/29/2020   Procedure: RIGHT/LEFT HEART CATH AND CORONARY ANGIOGRAPHY;  Surgeon: Jettie Booze, MD;  Location: [facility];  Service: Cardiovascular;  Laterality: N/A;    TEE WITHOUT CARDIOVERSION N/A 03/03/2020   Procedure: TRANSESOPHAGEAL ECHOCARDIOGRAM (TEE);  Surgeon: Sherren Mocha, MD;  Location: [facility];  Service: Open Heart Surgery;  Laterality: N/A;   TONSILLECTOMY     TRANSCATHETER AORTIC VALVE REPLACEMENT, TRANSFEMORAL  03/03/2020   TRANSCATHETER AORTIC VALVE REPLACEMENT, TRANSFEMORAL N/A 03/03/2020   Procedure: TRANSCATHETER AORTIC VALVE REPLACEMENT, TRANSFEMORAL USING EDWARDS SAPIEN 3 23 MM AORTIC VALVE.;  Surgeon: Sherren Mocha, MD;  Location: [facility];  Service: Open Heart Surgery;  Laterality: N/A;   VITRECTOMY 25 GAUGE WITH SCLERAL BUCKLE Left 02/12/2019   Procedure: Vitrectomy 25 Gauge With Scleral Buckle;  Surgeon: Jalene Mullet, MD;  Location: [facility];  Service: Ophthalmology;  Laterality: Left;   Social History   Social History Narrative   Not on file   Immunization History  Administered Date(s) Administered   Fluad Quad(high Dose 65+) 03/04/2020   Influenza, High Dose Seasonal PF 03/09/2014, 04/09/2015, 03/16/2016, 02/25/2017, 03/20/2018   PFIZER(Purple Top)SARS-COV-2 Vaccination 06/11/2019, 07/01/2019, 03/19/2020   Zoster Recombinat (Shingrix) 02/07/2018, 06/04/2018     Objective: Vital Signs: BP 119/64 (BP Location: Right Arm, Patient Position: Sitting, Cuff Size: Large)   Pulse 69   Resp 16   Ht 5\' 2"  (1.575 m)   Wt 173 lb (78.5 kg)   BMI 31.64 kg/m    Physical Exam Vitals and nursing note reviewed.  Constitutional:      Appearance: She is well-developed.  HENT:     Head: Normocephalic and atraumatic.  Eyes:     Conjunctiva/sclera: Conjunctivae normal.  Cardiovascular:     Rate and Rhythm: Normal rate and regular rhythm.     Heart sounds: Normal heart sounds.  Pulmonary:     Effort: Pulmonary effort is normal.     Breath sounds: Normal breath sounds.  Abdominal:     General: Bowel sounds are normal.     Palpations: Abdomen is soft.  Musculoskeletal:     Cervical back: Normal range of motion.  Lymphadenopathy:      Cervical: No cervical adenopathy.  Skin:    General: Skin is warm and dry.     Capillary Refill: Capillary refill takes less than 2 seconds.  Neurological:     Mental Status: She is alert and oriented to person, place, and time.  Psychiatric:        Behavior: Behavior normal.     Musculoskeletal Exam: C-spine was in good range of motion.  She had thoracic kyphosis.  Shoulder joints and elbow joints in good range of motion.  She had bilateral PIP and DIP thickening with no synovitis.  Hip joints are in good range of motion.  Knee joints in good range of motion without any warmth swelling or effusion.  There was no tenderness over ankles or MTPs.  CDAI Exam: CDAI Score: 0.4  Patient Global: 2 mm; Provider Global: 2 mm Swollen: 0 ; Tender:  0  Joint Exam 03/19/2021   No joint exam has been documented for this visit   There is currently no information documented on the homunculus. Go to the Rheumatology activity and complete the homunculus joint exam.  Investigation: No additional findings.  Imaging: ECHOCARDIOGRAM COMPLETE  Result Date: 03/11/2021    ECHOCARDIOGRAM REPORT   Patient Name:   KIARRAH RAUSCH Date of Exam: 03/11/2021 Medical Rec #:  [id number]          Height:       62.5 in Accession #:    [id number]         Weight:       167.0 lb Date of Birth:  11-27-1932         BSA:          1.781 m Patient Age:    76 years           BP:           167/72 mmHg Patient Gender: F                  HR:           69 bpm. Exam Location:  [place] Procedure: 2D Echo, Cardiac Doppler and Color Doppler Indications:    Z95.2 s/p TAVR  History:        Patient has prior history of Echocardiogram examinations, most                 recent 04/02/2020. CAD and Previous Myocardial Infarction,                 Arrythmias:Atrial Fibrillation; Risk Factors:Hypertension and                 Diabetes.                 Aortic Valve: 23 mm Sapien prosthetic, stented (TAVR) valve is                 present in the  aortic position. Procedure Date: 03/03/2020.  Sonographer:    Marygrace Drought RCS Referring Phys: [id number] [place]  1. Left ventricular ejection fraction, by estimation, is 65 to 70%. The left ventricle has normal function. The left ventricle has no regional wall motion abnormalities. Left ventricular diastolic parameters were normal.  2. Right ventricular systolic function is normal. The right ventricular size is normal. There is normal pulmonary artery systolic pressure.  3. Right atrial size was mildly dilated.  4. Mild mitral valve regurgitation.  5. S/p TAVR with 23 mm Sapien prosthesis (03/03/20). Peak and mean gradients through the valve are 35 and 19 mm Hg respectively. Compared to echo from 2021, mean gradient is increased mildly (14 to 19 mm HG). The aortic valve has been repaired/replaced. Aortic valve regurgitation is not visualized.  6. The inferior vena cava is normal in size with greater than 50% respiratory variability, suggesting right atrial pressure of 3 mmHg. FINDINGS  Left Ventricle: Left ventricular ejection fraction, by estimation, is 65 to 70%. The left ventricle has normal function. The left ventricle has no regional wall motion abnormalities. The left ventricular internal cavity size was normal in size. There is  no left ventricular hypertrophy. Left ventricular diastolic parameters were normal. Right Ventricle: The right ventricular size is normal. Right vetricular wall thickness was not assessed. Right ventricular systolic function is normal. There is normal pulmonary artery systolic pressure. The tricuspid regurgitant velocity is 2.84 m/s, and with an assumed  right atrial pressure of 3 mmHg, the estimated right ventricular systolic pressure is 83.4 mmHg. Left Atrium: Left atrial size was normal in size. Right Atrium: Right atrial size was mildly dilated. Pericardium: There is no evidence of pericardial effusion. Mitral Valve: There is mild thickening of the mitral  valve leaflet(s). Mild to moderate mitral annular calcification. Mild mitral valve regurgitation. Tricuspid Valve: The tricuspid valve is normal in structure. Tricuspid valve regurgitation is mild. Aortic Valve: S/p TAVR with 23 mm Sapien prosthesis (03/03/20). Peak and mean gradients through the valve are 35 and 19 mm Hg respectively. Compared to echo from 2021, mean gradient is increased mildly (14 to 19 mm HG). The aortic valve has been repaired/replaced. Aortic valve regurgitation is not visualized. Aortic valve mean gradient measures 19.0 mmHg. Aortic valve peak gradient measures 34.6 mmHg. Aortic valve area, by VTI measures 1.03 cm. There is a 23 mm Sapien prosthetic, stented (TAVR)  valve present in the aortic position. Procedure Date: 03/03/2020. Pulmonic Valve: The pulmonic valve was normal in structure. Pulmonic valve regurgitation is mild to moderate. Aorta: The aortic root and ascending aorta are structurally normal, with no evidence of dilitation. Venous: The inferior vena cava is normal in size with greater than 50% respiratory variability, suggesting right atrial pressure of 3 mmHg. IAS/Shunts: No atrial level shunt detected by color flow Doppler.  LEFT VENTRICLE PLAX 2D LVIDd:         4.80 cm   Diastology LVIDs:         3.00 cm   LV e' medial:    8.81 cm/s LV PW:         1.00 cm   LV E/e' medial:  17.7 LV IVS:        1.00 cm   LV e' lateral:   11.40 cm/s LVOT diam:     1.70 cm   LV E/e' lateral: 13.7 LV SV:         65 LV SV Index:   37 LVOT Area:     2.27 cm  RIGHT VENTRICLE RV Basal diam:  3.20 cm RV S prime:     7.02 cm/s TAPSE (M-mode): 1.7 cm RVSP:           35.3 mmHg LEFT ATRIUM           Index        RIGHT ATRIUM           Index LA diam:      4.50 cm 2.53 cm/m   RA Pressure: 3.00 mmHg LA Vol (A4C): 58.5 ml 32.84 ml/m  RA Area:     21.70 cm                                    RA Volume:   67.10 ml  37.67 ml/m  AORTIC VALVE AV Area (Vmax):    0.90 cm AV Area (Vmean):   0.92 cm AV Area  (VTI):     1.03 cm AV Vmax:           294.00 cm/s AV Vmean:          201.000 cm/s AV VTI:            0.634 m AV Peak Grad:      34.6 mmHg AV Mean Grad:      19.0 mmHg LVOT Vmax:         116.00 cm/s LVOT Vmean:  81.800 cm/s LVOT VTI:          0.287 m LVOT/AV VTI ratio: 0.45  AORTA Ao Root diam: 2.90 cm Ao Asc diam:  2.70 cm MITRAL VALVE                TRICUSPID VALVE MV Area (PHT):              TR Peak grad:   32.3 mmHg MV Decel Time:              TR Vmax:        284.00 cm/s MV E velocity: 156.00 cm/s  Estimated RAP:  3.00 mmHg MV A velocity: 90.10 cm/s   RVSP:           35.3 mmHg MV E/A ratio:  1.73                             SHUNTS                             Systemic VTI:  0.29 m                             Systemic Diam: 1.70 cm Dorris Carnes MD Electronically signed by Dorris Carnes MD Signature Date/Time: 03/11/2021/8:18:00 PM    Final     Recent Labs: Lab Results  Component Value Date   WBC 13.0 (H) 10/21/2020   HGB 11.8 (L) 10/21/2020   PLT 293 10/21/2020   NA 140 10/21/2020   K 4.6 10/21/2020   CL 104 10/21/2020   CO2 26 10/21/2020   GLUCOSE 61 (L) 10/21/2020   BUN 21 10/21/2020   CREATININE 1.06 (H) 10/21/2020   BILITOT 0.6 10/21/2020   ALKPHOS 64 10/21/2020   AST 14 (L) 10/21/2020   ALT 9 10/21/2020   PROT 7.2 10/21/2020   ALBUMIN 3.5 10/21/2020   CALCIUM 9.4 10/21/2020   GFRAA 66 10/16/2020    Speciality Comments: No specialty comments available.  Procedures:  No procedures performed Allergies: Codeine, Glimepiride, Jardiance [empagliflozin], Metformin and related, and Penicillins   Assessment / Plan:     Visit Diagnoses: Rheumatoid arthritis involving multiple sites with positive rheumatoid factor (HCC) - RF 309, ANA negative, CRP 5.8, synovitis.  She had no synovitis on my examination today.  She has been tolerating medications well.  High risk medication use - Methotrexate 2.5 mg 5 tablet every 7 days ., folic acid 1 mg  daily, and prednisone 3 mg po daily. - Plan:  CBC with Differential/Platelet, COMPLETE METABOLIC PANEL WITH GFR today and then every 3 months to monitor for drug toxicity.  Getting labs every 3 months was emphasized.  She was also advised to stop methotrexate in case she develops an infection restarts once infection resolves.  Information regarding realization was placed in the AVS.  Polymyalgia rheumatica (HCC)-she had no muscular weakness or tenderness on examination today.  Primary osteoarthritis of both hands-she had bilateral PIP and DIP thickening with no synovitis.  Primary osteoarthritis of both knees-she continues to have some discomfort in her knee joints but is doing well for now.  Primary osteoarthritis of both feet-denies discomfort.  History of humerus fracture - followed by Dr. Latanya Maudlin.   DDD (degenerative disc disease), lumbar - With a spinal stenosis.  She is followed by Dr. Mina Marble.  Patient states she developed severe  pain in her back about a month ago and had to use a walker.  She did very well after the spinal injection by Dr. Mina Marble.  Osteopenia of multiple sites - DEXA 05/15/18: The BMD measured at Femur Neck Left is 0.820 g/cm2 with a T-score of -1.6.  She has been advised to get repeat DEXA scan by Dr. Buddy Duty.  Chronic anticoagulation  History of diabetes mellitus  History of hyperlipidemia  History of hypertension-blood pressure is normal today.  History of gastroesophageal reflux (GERD)  Orders: Orders Placed This Encounter  Procedures   CBC with Differential/Platelet   COMPLETE METABOLIC PANEL WITH GFR    No orders of the defined types were placed in this encounter.   Follow-Up Instructions: Return in about 5 months (around 08/17/2021) for Rheumatoid arthritis, Osteoarthritis.   Bo Merino, MD  Note - This record has been created using Editor, commissioning.  Chart creation errors have been sought, but may not always  have been located. Such creation errors do not reflect on  the standard of  medical care.

## 2021-03-11 ENCOUNTER — Ambulatory Visit (HOSPITAL_COMMUNITY): Payer: Medicare Other | Attending: Cardiology

## 2021-03-11 ENCOUNTER — Encounter: Payer: Self-pay | Admitting: Physician Assistant

## 2021-03-11 ENCOUNTER — Ambulatory Visit (INDEPENDENT_AMBULATORY_CARE_PROVIDER_SITE_OTHER): Payer: Medicare Other | Admitting: Physician Assistant

## 2021-03-11 ENCOUNTER — Other Ambulatory Visit: Payer: Self-pay

## 2021-03-11 VITALS — BP 116/58 | HR 62 | Ht 62.0 in | Wt 175.0 lb

## 2021-03-11 DIAGNOSIS — I2510 Atherosclerotic heart disease of native coronary artery without angina pectoris: Secondary | ICD-10-CM

## 2021-03-11 DIAGNOSIS — D649 Anemia, unspecified: Secondary | ICD-10-CM

## 2021-03-11 DIAGNOSIS — I10 Essential (primary) hypertension: Secondary | ICD-10-CM | POA: Diagnosis not present

## 2021-03-11 DIAGNOSIS — I480 Paroxysmal atrial fibrillation: Secondary | ICD-10-CM | POA: Diagnosis not present

## 2021-03-11 DIAGNOSIS — Z952 Presence of prosthetic heart valve: Secondary | ICD-10-CM

## 2021-03-11 LAB — ECHOCARDIOGRAM COMPLETE
AR max vel: 0.9 cm2
AV Area VTI: 1.03 cm2
AV Area mean vel: 0.92 cm2
AV Mean grad: 19 mmHg
AV Peak grad: 34.6 mmHg
Ao pk vel: 2.94 m/s
Area-P 1/2: 3.48 cm2
S' Lateral: 3 cm

## 2021-03-11 NOTE — Progress Notes (Signed)
[facility] [place]                                       Cardiology Office Note    Date:  03/12/2021   ID:  Sophia Ward, DOB 28-Jun-1932, MRN [id number]  PCP:  [name], MD  Cardiologist: Dr. Irish Lack, MD / Dr. Burt Knack & Dr. Roxy Manns (TAVR)  CC: 1 year s/p TAVR  History of Present Illness:  Sophia Ward is a 87 y.o. female with a history of CAD s/p DES to LAD x1 (2004), chronic back issues, rheumatoid arthritis (on MTX and prednisone), multifactorial anemia, PAF previously on Eliquis, DMT2, HTN, HLD and severe aortic stenosis s/p TAVR (03/03/20) who presents to clinic for follow up.  The patient's cardiac history dates back to 2004 when she presented with an acute MI.  She was treated with PCI and stenting of the LAD.  The patient had early in-stent restenosis and underwent repeat PCI and stenting around 5 months later.   She has been followed by Dr. Irish Lack and noted to have at least moderate aortic stenosis by echo in 2019: EF 60%, mean gradient of 22 mm Hg.    She was admitted in May 2021 with acute blood loss anemia with a hemoglobin down to 7.9.  She underwent EGD and colonoscopy which were unremarkable except for a few ulcers in the terminal ileum.  During this admission she was also noted to go into paroxysmal atrial fibrillation. This was a new diagnosis for her.  She was started on Eliquis and Plavix was discontinued.  2D echo during this admission revealed normal LV function with progression of her aortic stenosis to severe: mean gradient 32 mmHg, peak gradient 56.9 mm hg, AVA 0.80 cm2, DVI 0.24, SVI 31. Plans were made to proceed with [facility], which was delayed due to a readmission in 12/2019 for UTI and acute hypoxic respiratory failure due to left lower lobe pneumonia and acute decompensated diastolic heart failure.    The patient eventually underwent left and right heart catheterization on 01/29/2020 with Dr. Beau Fanny.  This  revealed severe proximal two-vessel CAD with ostial stenosis of the RCA and 70% proximal stenosis of the LAD prior to widely patent previously placed stents. There was nonobstructive disease in the left circumflex. Mean transvalvular gradient across the aortic valve was measured 16.8 mmHg on pullback corresponding to aortic valve area calculated 1.59 cm. Pulmonary artery pressures were moderately elevated. On 02/25/20 she underwent staged successful PCI of the ostial RCA, followed by orbital atherectomy and PCI of the left main/LAD. Struts to circumflex opened with small balloon as well since the circumflex was jailed. She had worsening of her chronic anemia with a Hg down to 7.2 and was transfused. Her case was discussed by the multidisciplinary valve team as well as her hematologist, Dr. Irene Limbo. It was decided to proceed with TAVR and follow Hg closely.   She underwent successful TAVR with a 23 mm Edwards Sapien 3 Ultra THV via the TF approach on 03/03/20. Post operative echo showed EF 60%, normally functioning TAVR with a mean gradient 20 mm hg and no PVL. She was noted to be hypoxic and discharged home on 02 and a short course of lasix. She was resumed on aspirin and plavix. Given anemia, she was left off Eliquis. She has done well in follow up with a big  improvement in her symptoms. She was able to stop using the home 02. 1 month echo showed EF 60%, normally functioning TAVR with a mean gradient of 14 mmHg and no PVL, mild posterior pericardial effusion. Her Hg remained stable and she was later started back on Eliquis and aspirin discontinued.   She was last seen by Dr. Irish Lack in 10/2020 who wrote " If she were to ever develop a bleeding issue, we will have to reassess her anticoagulation.  Given her lack of A. fib symptoms, I would likely stop her Eliquis first if she were to have a bleeding problem.  Clopidogrel is more for her left main/LAD and ostial RCA stents."  Today she presents to clinic for follow  up. She is doing very well. Her biggest complaints is not feeling stable enough to walk. She had to walk with a walker a few weeks ago. She does feel tired a lot. She doesn't do a lot. Still coming to terms with losing her husband of over 12 years. She has a lot of family support. No CP or SOB. No LE edema, orthopnea or PND. No dizziness or syncope. No blood in stool or urine. No palpitations.   Past Medical History:  Diagnosis Date   Allergic rhinitis    Anemia 09/2019   Arthritis    hands   BPPV (benign paroxysmal positional vertigo)    Coronary artery disease    Diabetes mellitus without complication ([place])    Essential hypertension, benign 01/30/2014   GERD (gastroesophageal reflux disease)    Hypertension    Lesion of pancreas    Mixed hyperlipidemia 01/30/2014   Myocardial infarction ([place]) 2004   mild MI-no damage- had stents   Osteopenia    Post-menopausal    Pulmonary nodules    Rheumatoid arthritis (HCC)    S/P TAVR (transcatheter aortic valve replacement) 03/03/2020   s/p TAVR with a 23 mm Edwards S3U via the TF approach by Drs Burt Knack and Roxy Manns   Severe aortic stenosis    Spinal stenosis    Stenosing tenosynovitis     Past Surgical History:  Procedure Laterality Date   ABDOMINAL HYSTERECTOMY     APPENDECTOMY     BIOPSY  10/25/2019   Procedure: BIOPSY;  Surgeon: Otis Brace, MD;  Location: [place];  Service: Gastroenterology;;   BIOPSY  10/26/2019   Procedure: BIOPSY;  Surgeon: Otis Brace, MD;  Location: [place];  Service: Gastroenterology;;   CARDIAC CATHETERIZATION  2004   carpel tunnel     COLONOSCOPY WITH PROPOFOL N/A 10/30/2012   Procedure: COLONOSCOPY WITH PROPOFOL;  Surgeon: Garlan Fair, MD;  Location: [facility];  Service: Endoscopy;  Laterality: N/A;   COLONOSCOPY WITH PROPOFOL N/A 10/26/2019   Procedure: COLONOSCOPY WITH PROPOFOL;  Surgeon: Otis Brace, MD;  Location: [place];  Service: Gastroenterology;  Laterality: N/A;    CORONARY ANGIOPLASTY  2004   CORONARY ATHERECTOMY N/A 02/21/2020   Procedure: CORONARY ATHERECTOMY;  Surgeon: Jettie Booze, MD;  Location: [place] [facility];  Service: Cardiovascular;  Laterality: N/A;   CORONARY STENT INTERVENTION N/A 02/21/2020   Procedure: CORONARY STENT INTERVENTION;  Surgeon: Jettie Booze, MD;  Location: [place] [facility];  Service: Cardiovascular;  Laterality: N/A;   DILATION AND CURETTAGE OF UTERUS     ESOPHAGOGASTRODUODENOSCOPY N/A 10/25/2019   Procedure: ESOPHAGOGASTRODUODENOSCOPY (EGD);  Surgeon: Otis Brace, MD;  Location: [facility];  Service: Gastroenterology;  Laterality: N/A;   ESOPHAGOGASTRODUODENOSCOPY (EGD) WITH PROPOFOL N/A 10/30/2012   Procedure: ESOPHAGOGASTRODUODENOSCOPY (EGD)  WITH PROPOFOL;  Surgeon: Garlan Fair, MD;  Location: [facility];  Service: Endoscopy;  Laterality: N/A;   EYE SURGERY     bilateral cataract with lens implant   EYE SURGERY     INJECTION OF SILICONE OIL Left 08/25/9240   Procedure: Injection Of Silicone Oil;  Surgeon: Jalene Mullet, MD;  Location: [place];  Service: Ophthalmology;  Laterality: Left;   INTRAVASCULAR PRESSURE WIRE/FFR STUDY N/A 01/29/2020   Procedure: INTRAVASCULAR PRESSURE WIRE/FFR STUDY;  Surgeon: Jettie Booze, MD;  Location: [place] [facility];  Service: Cardiovascular;  Laterality: N/A;   INTRAVASCULAR ULTRASOUND/IVUS N/A 02/21/2020   Procedure: Intravascular Ultrasound/IVUS;  Surgeon: Jettie Booze, MD;  Location: [place] [facility];  Service: Cardiovascular;  Laterality: N/A;   left shouler surgery     PHOTOCOAGULATION WITH LASER Left 02/12/2019   Procedure: Photocoagulation With Laser;  Surgeon: Jalene Mullet, MD;  Location: [place];  Service: Ophthalmology;  Laterality: Left;   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 02/12/2019   Procedure: REPAIR OF COMPLEX TRACTION RETINAL DETACHMENT;  Surgeon: Jalene Mullet, MD;  Location: [place];  Service: Ophthalmology;   Laterality: Left;   RIGHT/LEFT HEART CATH AND CORONARY ANGIOGRAPHY N/A 01/29/2020   Procedure: RIGHT/LEFT HEART CATH AND CORONARY ANGIOGRAPHY;  Surgeon: Jettie Booze, MD;  Location: [place] [facility];  Service: Cardiovascular;  Laterality: N/A;   TEE WITHOUT CARDIOVERSION N/A 03/03/2020   Procedure: TRANSESOPHAGEAL ECHOCARDIOGRAM (TEE);  Surgeon: Sherren Mocha, MD;  Location: [place];  Service: Open Heart Surgery;  Laterality: N/A;   TONSILLECTOMY     TRANSCATHETER AORTIC VALVE REPLACEMENT, TRANSFEMORAL  03/03/2020   TRANSCATHETER AORTIC VALVE REPLACEMENT, TRANSFEMORAL N/A 03/03/2020   Procedure: TRANSCATHETER AORTIC VALVE REPLACEMENT, TRANSFEMORAL USING EDWARDS SAPIEN 3 23 MM AORTIC VALVE.;  Surgeon: Sherren Mocha, MD;  Location: [place];  Service: Open Heart Surgery;  Laterality: N/A;   VITRECTOMY 25 GAUGE WITH SCLERAL BUCKLE Left 02/12/2019   Procedure: Vitrectomy 25 Gauge With Scleral Buckle;  Surgeon: Jalene Mullet, MD;  Location: [place];  Service: Ophthalmology;  Laterality: Left;    Current Medications: Outpatient Medications Prior to Visit  Medication Sig Dispense Refill   ALPRAZolam (XANAX) 0.25 MG tablet Take 0.25 mg by mouth 2 (two) times daily as needed.     amLODipine (NORVASC) 10 MG tablet TAKE 1 TABLET BY MOUTH ONCE DAILY IN THE MORNING 90 tablet 2   apixaban (ELIQUIS) 5 MG TABS tablet Take 1 tablet by mouth twice daily 60 tablet 5   atorvastatin (LIPITOR) 40 MG tablet Take 1 tablet (40 mg total) by mouth at bedtime. 90 tablet 3   azithromycin (ZITHROMAX) 500 MG tablet Take 1 tablet (500 mg total) by mouth as directed. One tablet by mouth 60 MINUTES before any dental appointment 3 tablet 2   B-D ULTRAFINE III SHORT PEN 31G X 8 MM MISC 1 each by Other route in the morning, at noon, in the evening, and at bedtime.      Calcium Carbonate-Vitamin D (CALCIUM + D PO) Take 1 tablet by mouth daily.      Cholecalciferol (VITAMIN D3) 125 MCG (5000 UT) CAPS Take 5,000 Units by mouth  daily.      clopidogrel (PLAVIX) 75 MG tablet Take 4 tablets (300 mg) by mouth this evening, then take 1 tablet (75 mg) by mouth daily (Patient taking differently: Take 75 mg by mouth daily. Take 4 tablets (300 mg) by mouth this evening, then take 1 tablet (75 mg) by mouth daily)  90 tablet 0   desonide (DESOWEN) 0.05 % cream Apply 1 application topically 2 (two) times daily as needed.     escitalopram (LEXAPRO) 5 MG tablet Take 10 mg by mouth daily.     estradiol (ESTRACE) 0.1 MG/GM vaginal cream SMARTSIG:0.5 Gram(s) Vaginal     ferrous sulfate 325 (65 FE) MG tablet Take 325 mg by mouth daily with breakfast.     folic acid (FOLVITE) 1 MG tablet Take 1 tablet (1 mg total) by mouth daily. 180 tablet 1   gabapentin (NEURONTIN) 100 MG capsule TAKE 1 TO 2 CAPSULES BY MOUTH ONCE DAILY AT BEDTIME AS NEEDED     insulin lispro (HUMALOG) 100 UNIT/ML KiwkPen Inject 6 Units into the skin 3 (three) times daily.      LEVEMIR FLEXTOUCH 100 UNIT/ML Pen Inject 14 Units into the skin every morning.   0   methotrexate 2.5 MG tablet TAKE 5 TABLETS BY MOUTH ONCE A WEEK 60 tablet 0   metoprolol succinate (TOPROL-XL) 100 MG 24 hr tablet TAKE 1 TABLET BY MOUTH IN THE MORNING 90 tablet 2   nitroGLYCERIN (NITROSTAT) 0.4 MG SL tablet DISSOLVE ONE TABLET UNDER THE TONGUE EVERY 5 MINUTES AS NEEDED FOR CHEST PAIN.  DO NOT EXCEED A TOTAL OF 3 DOSES IN 15 MINUTES 25 tablet 5   Omega-3 Fatty Acids (FISH OIL PO) Take 1 capsule by mouth daily.      ondansetron (ZOFRAN ODT) 4 MG disintegrating tablet Take 1 tablet (4 mg total) by mouth every 8 (eight) hours as needed for nausea or vomiting. 20 tablet 0   pantoprazole (PROTONIX) 40 MG tablet Take 1 tablet (40 mg total) by mouth daily after lunch. 30 tablet 1   predniSONE (DELTASONE) 1 MG tablet Take 3 tablets (3 mg total) by mouth daily. 270 tablet 0   ramipril (ALTACE) 10 MG tablet Take 10 mg by mouth 2 (two) times daily.      ferrous sulfate 325 (65 FE) MG tablet Take 1 tablet (325  mg total) by mouth 2 (two) times daily with a meal. (Patient not taking: Reported on 03/11/2021) 60 tablet 1   No facility-administered medications prior to visit.     Allergies:   Codeine, Glimepiride, Jardiance [empagliflozin], Metformin and related, and Penicillins   Social History   Socioeconomic History   Marital status: Widowed    Spouse name: Not on file   Number of children: Not on file   Years of education: Not on file   Highest education level: Not on file  Occupational History   Not on file  Tobacco Use   Smoking status: Never   Smokeless tobacco: Never  Vaping Use   Vaping Use: Never used  Substance and Sexual Activity   Alcohol use: No   Drug use: No   Sexual activity: Not Currently  Other Topics Concern   Not on file  Social History Narrative   Not on file   Social Determinants of Health   Financial Resource Strain: Not on file  Food Insecurity: Not on file  Transportation Needs: Not on file  Physical Activity: Not on file  Stress: Not on file  Social Connections: Not on file     Family History:  The patient's family history includes Arthritis in her daughter; CAD in her father and mother; Heart Problems in her brother; Heart attack in her mother.     ROS:   Please see the history of present illness.    ROS All other systems reviewed  and are negative.   PHYSICAL EXAM:   VS:  BP (!) 116/58   Pulse 62   Ht 5\' 2"  (1.575 m)   Wt 175 lb (79.4 kg)   SpO2 94%   BMI 32.01 kg/m    GEN: Well nourished, well developed, in no acute distress HEENT: normal Neck: no JVD or masses Cardiac: RRR; soft flow murmur@ RUSB. No rubs, or gallops,no edema  Respiratory:  clear to auscultation bilaterally, normal work of breathing GI: soft, nontender, nondistended, + BS MS: no deformity or atrophy Skin: warm and dry, no rash Neuro:  Alert and Oriented x 3, Strength and sensation are intact Psych: euthymic mood, full affect   Wt Readings from Last 3 Encounters:   03/11/21 175 lb (79.4 kg)  11/04/20 167 lb (75.8 kg)  10/21/20 170 lb 1.6 oz (77.2 kg)      Studies/Labs Reviewed:   EKG:  EKG is NOT ordered today.    Recent Labs: 10/21/2020: ALT 9; BUN 21; Creatinine 1.06; Hemoglobin 11.8; Platelets 293; Potassium 4.6; Sodium 140   Lipid Panel    Component Value Date/Time   CHOL 121 02/25/2020 0223   TRIG 76 02/25/2020 0223   HDL 49 02/25/2020 0223   CHOLHDL 2.5 02/25/2020 0223   VLDL 15 02/25/2020 0223   LDLCALC 57 02/25/2020 0223    Additional studies/ records that were reviewed today include:  TAVR OPERATIVE NOTE     Date of Procedure:                03/03/2020   Preoperative Diagnosis:      Severe Aortic Stenosis    Postoperative Diagnosis:    Same    Procedure:        Transcatheter Aortic Valve Replacement - Percutaneous Right Transfemoral Approach             Edwards Sapien 3 Ultra THV (size 23 mm, model # 9750TFX, serial # [id number])              Co-Surgeons:                        Valentina Gu. Roxy Manns, MD and Sherren Mocha, MD   Anesthesiologist:                  Renold Don, MD   Echocardiographer:              Jenkins Rouge, MD   Pre-operative Echo Findings: Severe aortic stenosis Normal left ventricular systolic function   Post-operative Echo Findings: No paravalvular leak Normal left ventricular systolic function _____________   Echo 03/04/20: IMPRESSIONS   1. Left ventricular ejection fraction, by estimation, is 60 to 65%. The  left ventricle has normal function.   2. Left atrial size was mildly dilated.   3. The mitral valve is abnormal. Mild mitral valve regurgitation. Severe  mitral annular calcification.   4. Post TAVR with 23 mm Sapien 3 valve no PVL Baseline gradients somewhat  high with AVA only 1.2 cm2 and DVI 0.42. The aortic valve has been  repaired/replaced. Aortic valve regurgitation is not visualized.   5. There is moderately elevated pulmonary artery systolic pressure.   6. The inferior vena  cava is dilated in size with >50% respiratory variability, suggesting right atrial pressure of 8 mmHg.   _________________   Echo 06/03/19 IMPRESSIONS  1. Left ventricular ejection fraction, by estimation, is 60 to 65%. The  left ventricle has normal function. The left ventricle  has no regional  wall motion abnormalities. Left ventricular diastolic parameters are  consistent with Grade II diastolic  dysfunction (pseudonormalization).   2. Right ventricular systolic function is mildly reduced. The right  ventricular size is normal. There is mildly elevated pulmonary artery  systolic pressure. The estimated right ventricular systolic pressure is  31.5 mmHg.   3. Left atrial size was mildly dilated.   4. The mitral valve is normal in structure. Trivial mitral valve  regurgitation. No evidence of mitral stenosis.   5. Bioprosthetic aortic valve s/p TAVR. Mean gradient 14 mmHg. No  significant peri-valvular leakage.   6. The inferior vena cava is normal in size with greater than 50%  respiratory variability, suggesting right atrial pressure of 3 mmHg.   7. Small, primarily posterior pericardial effusion.    _____________________  Echo 03/11/21 IMPRESSIONS   1. Left ventricular ejection fraction, by estimation, is 65 to 70%. The  left ventricle has normal function. The left ventricle has no regional  wall motion abnormalities. Left ventricular diastolic parameters were  normal.   2. Right ventricular systolic function is normal. The right ventricular  size is normal. There is normal pulmonary artery systolic pressure.   3. Right atrial size was mildly dilated.   4. Mild mitral valve regurgitation.   5. S/p TAVR with 23 mm Sapien prosthesis (03/03/20). Peak and mean  gradients through the valve are 35 and 19 mm Hg respectively. Compared to  echo from 2021, mean gradient is increased mildly (14 to 19 mm HG). The  aortic valve has been repaired/replaced.  Aortic valve regurgitation is  not visualized.   6. The inferior vena cava is normal in size with greater than 50%  respiratory variability, suggesting right atrial pressure of 3 mmHg.   ASSESSMENT & PLAN:   Severe AS s/p TAVR: echo today shows EF 60%, normally functioning TAVR with a mean gradient of 19 mmHg and no PVL. She has NYHA class II symptoms; mostly of fatigue. She is mostly limited by gait instability. SBE prophylaxis discussed; she has azithromycin. Continue on Plavix and Eliquis long term for CAD and afib.   CAD: s/p PCI on 02/21/20. Continue plavix, BB and statin. Per Dr. Irish Lack "clopidogrel is more for her left main/LAD and ostial RCA stents."    PAF: sounds regular on exam. Continue on Eliquis 5mg  BID.    Chronic multifactorial anemia: hg has been stable.    HTN: BP well controlled today. No changes made.   Medication Adjustments/Labs and Tests Ordered: Current medicines are reviewed at length with the patient today.  Concerns regarding medicines are outlined above.  Medication changes, Labs and Tests ordered today are listed in the Patient Instructions below. Patient Instructions  Medication Instructions:  No changes *If you need a refill on your cardiac medications before your next appointment, please call your pharmacy*   Lab Work: none If you have labs (blood work) drawn today and your tests are completely normal, you will receive your results only by: [place] (if you have MyChart) OR A paper copy in the mail If you have any lab test that is abnormal or we need to change your treatment, we will call you to review the results.   Testing/Procedures: none   Follow-Up: As planned with Dr. Irish Lack   Other Instructions      Signed, Angelena Form, PA-C  03/12/2021 9:53 AM    [facility] [address], [place], [place]  [address] Phone: [phone]; Fax: [phone]    

## 2021-03-11 NOTE — Patient Instructions (Signed)
Medication Instructions:  No changes *If you need a refill on your cardiac medications before your next appointment, please call your pharmacy*   Lab Work: none If you have labs (blood work) drawn today and your tests are completely normal, you will receive your results only by: [place] (if you have MyChart) OR A paper copy in the mail If you have any lab test that is abnormal or we need to change your treatment, we will call you to review the results.   Testing/Procedures: none   Follow-Up: As planned with Dr. Irish Lack   Other Instructions

## 2021-03-15 DIAGNOSIS — Z794 Long term (current) use of insulin: Secondary | ICD-10-CM | POA: Diagnosis not present

## 2021-03-15 DIAGNOSIS — I2510 Atherosclerotic heart disease of native coronary artery without angina pectoris: Secondary | ICD-10-CM | POA: Diagnosis not present

## 2021-03-15 DIAGNOSIS — E119 Type 2 diabetes mellitus without complications: Secondary | ICD-10-CM | POA: Diagnosis not present

## 2021-03-19 ENCOUNTER — Encounter: Payer: Self-pay | Admitting: Rheumatology

## 2021-03-19 ENCOUNTER — Ambulatory Visit (INDEPENDENT_AMBULATORY_CARE_PROVIDER_SITE_OTHER): Payer: Medicare Other | Admitting: Rheumatology

## 2021-03-19 ENCOUNTER — Other Ambulatory Visit: Payer: Self-pay

## 2021-03-19 VITALS — BP 119/64 | HR 69 | Resp 16 | Ht 62.0 in | Wt 173.0 lb

## 2021-03-19 DIAGNOSIS — Z8781 Personal history of (healed) traumatic fracture: Secondary | ICD-10-CM | POA: Diagnosis not present

## 2021-03-19 DIAGNOSIS — M19072 Primary osteoarthritis, left ankle and foot: Secondary | ICD-10-CM

## 2021-03-19 DIAGNOSIS — Z8719 Personal history of other diseases of the digestive system: Secondary | ICD-10-CM

## 2021-03-19 DIAGNOSIS — M170 Bilateral primary osteoarthritis of knee: Secondary | ICD-10-CM | POA: Diagnosis not present

## 2021-03-19 DIAGNOSIS — M19041 Primary osteoarthritis, right hand: Secondary | ICD-10-CM | POA: Diagnosis not present

## 2021-03-19 DIAGNOSIS — Z8679 Personal history of other diseases of the circulatory system: Secondary | ICD-10-CM | POA: Diagnosis not present

## 2021-03-19 DIAGNOSIS — M19042 Primary osteoarthritis, left hand: Secondary | ICD-10-CM

## 2021-03-19 DIAGNOSIS — Z7901 Long term (current) use of anticoagulants: Secondary | ICD-10-CM | POA: Diagnosis not present

## 2021-03-19 DIAGNOSIS — I2510 Atherosclerotic heart disease of native coronary artery without angina pectoris: Secondary | ICD-10-CM

## 2021-03-19 DIAGNOSIS — Z79899 Other long term (current) drug therapy: Secondary | ICD-10-CM

## 2021-03-19 DIAGNOSIS — M0579 Rheumatoid arthritis with rheumatoid factor of multiple sites without organ or systems involvement: Secondary | ICD-10-CM

## 2021-03-19 DIAGNOSIS — M5136 Other intervertebral disc degeneration, lumbar region: Secondary | ICD-10-CM

## 2021-03-19 DIAGNOSIS — Z8639 Personal history of other endocrine, nutritional and metabolic disease: Secondary | ICD-10-CM

## 2021-03-19 DIAGNOSIS — M19071 Primary osteoarthritis, right ankle and foot: Secondary | ICD-10-CM

## 2021-03-19 DIAGNOSIS — M8589 Other specified disorders of bone density and structure, multiple sites: Secondary | ICD-10-CM | POA: Diagnosis not present

## 2021-03-19 DIAGNOSIS — M353 Polymyalgia rheumatica: Secondary | ICD-10-CM | POA: Diagnosis not present

## 2021-03-19 DIAGNOSIS — M7061 Trochanteric bursitis, right hip: Secondary | ICD-10-CM

## 2021-03-19 NOTE — Patient Instructions (Signed)
Standing Labs We placed an order today for your standing lab work.   Please have your standing labs drawn in January and every 3 months  If possible, please have your labs drawn 2 weeks prior to your appointment so that the provider can discuss your results at your appointment.  Please note that you may see your imaging and lab results in MyChart before we have reviewed them. We may be awaiting multiple results to interpret others before contacting you. Please allow our office up to 72 hours to thoroughly review all of the results before contacting the office for clarification of your results.  We have open lab daily: Monday through Thursday from 1:30-4:30 PM and Friday from 1:30-4:00 PM at the office of Dr. Tenasia Aull, [facility].   Please be advised, all patients with office appointments requiring lab work will take precedent over walk-in lab work.  If possible, please come for your lab work on Monday and Friday afternoons, as you may experience shorter wait times. The office is located at [address], [address], [place], [place] [address] No appointment is necessary.   Labs are drawn by [facility]. Please bring your co-pay at the time of your lab draw.  You may receive a bill from [facility] for your lab work.  If you wish to have your labs drawn at another location, please call the office 24 hours in advance to send orders.  If you have any questions regarding directions or hours of operation,  please call [phone].   As a reminder, please drink plenty of water prior to coming for your lab work. Thanks!   Vaccines You are taking a medication(s) that can suppress your immune system.  The following immunizations are recommended: Flu annually Covid-19  Td/Tdap (tetanus, diphtheria, pertussis) every 10 years Pneumonia (Prevnar 15 then Pneumovax 23 at least 1 year apart.  Alternatively, can take Prevnar 20 without needing additional dose) Shingrix: 2 doses from 4 weeks  to 6 months apart  Please check with your PCP to make sure you are up to date.   If you have signs or symptoms of an infection or start antibiotics: First, call your PCP for workup of your infection. Hold your medication through the infection, until you complete your antibiotics, and until symptoms resolve if you take the following: Injectable medication (Actemra, Benlysta, Cimzia, Cosentyx, Enbrel, Humira, Kevzara, Orencia, Remicade, Simponi, Stelara, Taltz, Tremfya) Methotrexate Leflunomide (Arava) Mycophenolate (Cellcept) Xeljanz, Olumiant, or Rinvoq  

## 2021-03-20 LAB — COMPLETE METABOLIC PANEL WITH GFR
AG Ratio: 1.4 (calc) (ref 1.0–2.5)
ALT: 17 U/L (ref 6–29)
AST: 19 U/L (ref 10–35)
Albumin: 3.9 g/dL (ref 3.6–5.1)
Alkaline phosphatase (APISO): 57 U/L (ref 37–153)
BUN/Creatinine Ratio: 24 (calc) — ABNORMAL HIGH (ref 6–22)
BUN: 24 mg/dL (ref 7–25)
CO2: 26 mmol/L (ref 20–32)
Calcium: 9.5 mg/dL (ref 8.6–10.4)
Chloride: 105 mmol/L (ref 98–110)
Creat: 0.98 mg/dL — ABNORMAL HIGH (ref 0.60–0.95)
Globulin: 2.8 g/dL (calc) (ref 1.9–3.7)
Glucose, Bld: 131 mg/dL — ABNORMAL HIGH (ref 65–99)
Potassium: 4 mmol/L (ref 3.5–5.3)
Sodium: 140 mmol/L (ref 135–146)
Total Bilirubin: 0.6 mg/dL (ref 0.2–1.2)
Total Protein: 6.7 g/dL (ref 6.1–8.1)
eGFR: 57 mL/min/{1.73_m2} — ABNORMAL LOW (ref >=60)

## 2021-03-20 LAB — CBC WITH DIFFERENTIAL/PLATELET
Absolute Monocytes: 954 cells/uL — ABNORMAL HIGH (ref 200–950)
Basophils Absolute: 95 cells/uL (ref 0–200)
Basophils Relative: 0.9 %
Eosinophils Absolute: 466 cells/uL (ref 15–500)
Eosinophils Relative: 4.4 %
HCT: 39.9 % (ref 35.0–45.0)
Hemoglobin: 12.9 g/dL (ref 11.7–15.5)
Lymphs Abs: 1760 cells/uL (ref 850–3900)
MCH: 30.4 pg (ref 27.0–33.0)
MCHC: 32.3 g/dL (ref 32.0–36.0)
MCV: 94.1 fL (ref 80.0–100.0)
MPV: 9.8 fL (ref 7.5–12.5)
Monocytes Relative: 9 %
Neutro Abs: 7325 cells/uL (ref 1500–7800)
Neutrophils Relative %: 69.1 %
Platelets: 351 10*3/uL (ref 140–400)
RBC: 4.24 10*6/uL (ref 3.80–5.10)
RDW: 13.2 % (ref 11.0–15.0)
Total Lymphocyte: 16.6 %
WBC: 10.6 10*3/uL (ref 3.8–10.8)

## 2021-03-20 NOTE — Progress Notes (Signed)
CBC is normal.  Creatinine is mildly elevated and stable.  Glucose is elevated, probably not a fasting sample.

## 2021-03-25 ENCOUNTER — Other Ambulatory Visit: Payer: Self-pay | Admitting: Physician Assistant

## 2021-03-25 NOTE — Telephone Encounter (Signed)
Next Visit: 08/13/2021  Last Visit: 03/19/2021  Last Fill: 01/05/2021  Dx: Rheumatoid arthritis involving multiple sites with positive rheumatoid factor   Current Dose per office note on 03/19/2021: prednisone 3 mg po daily  Okay to refill prednisone?

## 2021-04-13 DIAGNOSIS — L570 Actinic keratosis: Secondary | ICD-10-CM | POA: Diagnosis not present

## 2021-04-13 DIAGNOSIS — Z85828 Personal history of other malignant neoplasm of skin: Secondary | ICD-10-CM | POA: Diagnosis not present

## 2021-04-20 ENCOUNTER — Other Ambulatory Visit: Payer: Self-pay | Admitting: Rheumatology

## 2021-04-20 MED ORDER — FOLIC ACID 1 MG PO TABS: 1.0000 mg | ORAL_TABLET | Freq: Every day | ORAL | 1 refills | Status: DC

## 2021-04-20 MED ORDER — METHOTREXATE SODIUM 2.5 MG PO TABS: ORAL_TABLET | ORAL | 0 refills | Status: DC

## 2021-04-20 NOTE — Telephone Encounter (Signed)
Patient request a refill on MTX, and RX called in on Folic Acid sent to [facility] on [address].

## 2021-04-20 NOTE — Telephone Encounter (Signed)
Next Visit: 08/13/2021  Last Visit: 03/19/2021  Last Fill: 06/04/8164 (Folic Acid) 06/07/6938 (MTX)  DX: Rheumatoid arthritis involving multiple sites with positive rheumatoid factor   Current Dose per office note 03/19/2021: Methotrexate 2.5 mg 5 tablet every 7 days ., folic acid 1 mg  daily  Labs: 03/19/2021 CBC is normal.  Creatinine is mildly elevated and stable.  Glucose is elevated, probably not a fasting sample.  Okay to refill MTX and Folic Acid?

## 2021-05-03 DIAGNOSIS — H40013 Open angle with borderline findings, low risk, bilateral: Secondary | ICD-10-CM | POA: Diagnosis not present

## 2021-05-03 DIAGNOSIS — H33052 Total retinal detachment, left eye: Secondary | ICD-10-CM | POA: Diagnosis not present

## 2021-05-03 DIAGNOSIS — H3581 Retinal edema: Secondary | ICD-10-CM | POA: Diagnosis not present

## 2021-05-03 DIAGNOSIS — E119 Type 2 diabetes mellitus without complications: Secondary | ICD-10-CM | POA: Diagnosis not present

## 2021-05-09 NOTE — Progress Notes (Signed)
Cardiology Office Note   Date:  05/10/2021   ID:  Sophia Ward, DOB 1932-06-22, MRN [id number]  PCP:  [name], MD    No chief complaint on file.  CAD  Wt Readings from Last 3 Encounters:  05/10/21 181 lb (82.1 kg)  03/19/21 173 lb (78.5 kg)  03/11/21 175 lb (79.4 kg)       History of Present Illness: Sophia Ward is a 87 y.o. female   with a history of CAD s/p DES to LAD x1 (2004), chronic back issues, rheumatoid arthritis (on MTX and prednisone), multifactorial anemia, PAF previously on Eliquis, DMT2, HTN, HLD and severe aortic stenosis s/p TAVR (03/03/20)..   The patient's cardiac history dates back to 2004 when she presented with an acute MI.  She was treated with PCI and stenting of the LAD.  The patient had early in-stent restenosis and underwent repeat PCI and stenting around 5 months later.   Moderate aortic stenosis by echo in 2019: EF 60%, mean gradient of 22 mm Hg.    She was admitted in May 2021 with acute blood loss anemia with a hemoglobin down to 7.9.  She underwent EGD and colonoscopy which were unremarkable except for a few ulcers in the terminal ileum.  During this admission she was also noted to go into paroxysmal atrial fibrillation. This was a new diagnosis for her.  She was started on Eliquis and Plavix was discontinued.  2D echo during this admission revealed normal LV function with progression of her aortic stenosis to severe: mean gradient 32 mmHg, peak gradient 56.9 mm hg, AVA 0.80 cm2, DVI 0.24, SVI 31. Plans were made to proceed with [facility], which was delayed due to a readmission in 12/2019 for UTI and acute hypoxic respiratory failure due to left lower lobe pneumonia and acute decompensated diastolic heart failure.    The patient eventually underwent left and right heart catheterization on 01/29/2020.  This revealed severe proximal two-vessel CAD with ostial stenosis of the RCA and 70% proximal stenosis of the LAD prior to widely patent  previously placed stents. There was nonobstructive disease in the left circumflex. Mean transvalvular gradient across the aortic valve was measured 16.8 mmHg on pullback corresponding to aortic valve area calculated 1.59 cm. Pulmonary artery pressures were moderately elevated. On 02/25/20 she underwent staged successful PCI of the ostial RCA, followed by orbital atherectomy and PCI of the left main/LAD. Struts to circumflex opened with small balloon as well since the circumflex was jailed. She had worsening of her chronic anemia with a Hg down to 7.2 and was transfused. Her case was discussed by the multidisciplinary valve team as well as her hematologist, Dr. Irene Limbo. It was decided to proceed with TAVR and follow Hg closely.    She underwent successful TAVR with a 23 mm Edwards Sapien 3 Ultra THV via the TF approach on 03/03/20. Post operative echo showed EF 60%, normally functioning TAVR with a mean gradient 20 mm hg and no PVL. She was noted to be hypoxic and discharged home on 02 and a short course of lasix. She was resumed on aspirin and plavix. Given anemia, she was left off Eliquis. She has done well in follow up with a big improvement in her symptoms. She was able to stop using the home 02. 1 month echo showed EF 60%, normally functioning TAVR with a mean gradient of 14 mmHg and no PVL, mild posterior pericardial effusion. Her Hg remained stable and she was later started  back on Eliquis and aspirin discontinued.    She was last seen by Dr. Irish Lack in 10/2020 who wrote " If she were to ever develop a bleeding issue, we will have to reassess her anticoagulation.  Given her lack of A. fib symptoms, I would likely stop her Eliquis first if she were to have a bleeding problem.  Clopidogrel is more for her left main/LAD and ostial RCA stents."   In 2022, arthritis has progressed.  Walking in the house somewhat but no regular exercise.  Walks in the grocery store.    Denies : Chest pain. Dizziness. Leg edema.  Nitroglycerin use. Orthopnea. Palpitations. Paroxysmal nocturnal dyspnea. Shortness of breath. Syncope.    No bleeding issues.    Past Medical History:  Diagnosis Date   Allergic rhinitis    Anemia 09/2019   Arthritis    hands   BPPV (benign paroxysmal positional vertigo)    Coronary artery disease    Diabetes mellitus without complication ([place])    Essential hypertension, benign 01/30/2014   GERD (gastroesophageal reflux disease)    Hypertension    Lesion of pancreas    Mixed hyperlipidemia 01/30/2014   Myocardial infarction ([place]) 2004   mild MI-no damage- had stents   Osteopenia    Post-menopausal    Pulmonary nodules    Rheumatoid arthritis (HCC)    S/P TAVR (transcatheter aortic valve replacement) 03/03/2020   s/p TAVR with a 23 mm Edwards S3U via the TF approach by Drs Burt Knack and Roxy Manns   Severe aortic stenosis    Spinal stenosis    Stenosing tenosynovitis     Past Surgical History:  Procedure Laterality Date   ABDOMINAL HYSTERECTOMY     APPENDECTOMY     BIOPSY  10/25/2019   Procedure: BIOPSY;  Surgeon: Otis Brace, MD;  Location: [place];  Service: Gastroenterology;;   BIOPSY  10/26/2019   Procedure: BIOPSY;  Surgeon: Otis Brace, MD;  Location: [place];  Service: Gastroenterology;;   CARDIAC CATHETERIZATION  2004   carpel tunnel     COLONOSCOPY WITH PROPOFOL N/A 10/30/2012   Procedure: COLONOSCOPY WITH PROPOFOL;  Surgeon: Garlan Fair, MD;  Location: [facility];  Service: Endoscopy;  Laterality: N/A;   COLONOSCOPY WITH PROPOFOL N/A 10/26/2019   Procedure: COLONOSCOPY WITH PROPOFOL;  Surgeon: Otis Brace, MD;  Location: [place];  Service: Gastroenterology;  Laterality: N/A;   CORONARY ANGIOPLASTY  2004   CORONARY ATHERECTOMY N/A 02/21/2020   Procedure: CORONARY ATHERECTOMY;  Surgeon: Jettie Booze, MD;  Location: [place] [facility];  Service: Cardiovascular;  Laterality: N/A;   CORONARY STENT INTERVENTION N/A 02/21/2020    Procedure: CORONARY STENT INTERVENTION;  Surgeon: Jettie Booze, MD;  Location: [place] [facility];  Service: Cardiovascular;  Laterality: N/A;   DILATION AND CURETTAGE OF UTERUS     ESOPHAGOGASTRODUODENOSCOPY N/A 10/25/2019   Procedure: ESOPHAGOGASTRODUODENOSCOPY (EGD);  Surgeon: Otis Brace, MD;  Location: [facility];  Service: Gastroenterology;  Laterality: N/A;   ESOPHAGOGASTRODUODENOSCOPY (EGD) WITH PROPOFOL N/A 10/30/2012   Procedure: ESOPHAGOGASTRODUODENOSCOPY (EGD) WITH PROPOFOL;  Surgeon: Garlan Fair, MD;  Location: [facility];  Service: Endoscopy;  Laterality: N/A;   EYE SURGERY     bilateral cataract with lens implant   EYE SURGERY     INJECTION OF SILICONE OIL Left 7/32/2025   Procedure: Injection Of Silicone Oil;  Surgeon: Jalene Mullet, MD;  Location: [place];  Service: Ophthalmology;  Laterality: Left;   INTRAVASCULAR PRESSURE WIRE/FFR STUDY N/A 01/29/2020   Procedure: INTRAVASCULAR PRESSURE  WIRE/FFR STUDY;  Surgeon: Jettie Booze, MD;  Location: [place] [facility];  Service: Cardiovascular;  Laterality: N/A;   INTRAVASCULAR ULTRASOUND/IVUS N/A 02/21/2020   Procedure: Intravascular Ultrasound/IVUS;  Surgeon: Jettie Booze, MD;  Location: [place] [facility];  Service: Cardiovascular;  Laterality: N/A;   left shouler surgery     PHOTOCOAGULATION WITH LASER Left 02/12/2019   Procedure: Photocoagulation With Laser;  Surgeon: Jalene Mullet, MD;  Location: [place];  Service: Ophthalmology;  Laterality: Left;   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 02/12/2019   Procedure: REPAIR OF COMPLEX TRACTION RETINAL DETACHMENT;  Surgeon: Jalene Mullet, MD;  Location: [place];  Service: Ophthalmology;  Laterality: Left;   RIGHT/LEFT HEART CATH AND CORONARY ANGIOGRAPHY N/A 01/29/2020   Procedure: RIGHT/LEFT HEART CATH AND CORONARY ANGIOGRAPHY;  Surgeon: Jettie Booze, MD;  Location: [place] [facility];  Service: Cardiovascular;  Laterality: N/A;   TEE  WITHOUT CARDIOVERSION N/A 03/03/2020   Procedure: TRANSESOPHAGEAL ECHOCARDIOGRAM (TEE);  Surgeon: Sherren Mocha, MD;  Location: [place];  Service: Open Heart Surgery;  Laterality: N/A;   TONSILLECTOMY     TRANSCATHETER AORTIC VALVE REPLACEMENT, TRANSFEMORAL  03/03/2020   TRANSCATHETER AORTIC VALVE REPLACEMENT, TRANSFEMORAL N/A 03/03/2020   Procedure: TRANSCATHETER AORTIC VALVE REPLACEMENT, TRANSFEMORAL USING EDWARDS SAPIEN 3 23 MM AORTIC VALVE.;  Surgeon: Sherren Mocha, MD;  Location: [place];  Service: Open Heart Surgery;  Laterality: N/A;   VITRECTOMY 25 GAUGE WITH SCLERAL BUCKLE Left 02/12/2019   Procedure: Vitrectomy 25 Gauge With Scleral Buckle;  Surgeon: Jalene Mullet, MD;  Location: [place];  Service: Ophthalmology;  Laterality: Left;     Current Outpatient Medications  Medication Sig Dispense Refill   amLODipine (NORVASC) 10 MG tablet TAKE 1 TABLET BY MOUTH ONCE DAILY IN THE MORNING 90 tablet 2   apixaban (ELIQUIS) 5 MG TABS tablet Take 1 tablet by mouth twice daily 60 tablet 5   atorvastatin (LIPITOR) 40 MG tablet Take 1 tablet (40 mg total) by mouth at bedtime. 90 tablet 3   azithromycin (ZITHROMAX) 500 MG tablet Take 1 tablet (500 mg total) by mouth as directed. One tablet by mouth 60 MINUTES before any dental appointment 3 tablet 2   B-D ULTRAFINE III SHORT PEN 31G X 8 MM MISC 1 each by Other route in the morning, at noon, in the evening, and at bedtime.      Calcium Carbonate-Vitamin D (CALCIUM + D PO) Take 1 tablet by mouth daily.      Cholecalciferol (VITAMIN D3) 125 MCG (5000 UT) CAPS Take 5,000 Units by mouth daily.      clopidogrel (PLAVIX) 75 MG tablet Take 4 tablets (300 mg) by mouth this evening, then take 1 tablet (75 mg) by mouth daily (Patient taking differently: Take 75 mg by mouth daily. Take 4 tablets (300 mg) by mouth this evening, then take 1 tablet (75 mg) by mouth daily) 90 tablet 0   desonide (DESOWEN) 0.05 % cream Apply 1 application topically 2 (two) times daily as  needed.     escitalopram (LEXAPRO) 5 MG tablet Take 10 mg by mouth daily.     estradiol (ESTRACE) 0.1 MG/GM vaginal cream SMARTSIG:0.5 Gram(s) Vaginal     ferrous sulfate 325 (65 FE) MG tablet Take 1 tablet (325 mg total) by mouth 2 (two) times daily with a meal. 60 tablet 1   folic acid (FOLVITE) 1 MG tablet Take 1 tablet (1 mg total) by mouth daily. 180 tablet 1   gabapentin (NEURONTIN) 100 MG  capsule TAKE 1 TO 2 CAPSULES BY MOUTH ONCE DAILY AT BEDTIME AS NEEDED     insulin lispro (HUMALOG) 100 UNIT/ML KiwkPen Inject 6 Units into the skin 3 (three) times daily.      LEVEMIR FLEXTOUCH 100 UNIT/ML Pen Inject 14 Units into the skin every morning.   0   methotrexate 2.5 MG tablet TAKE 5 TABLETS BY MOUTH ONCE A WEEK 60 tablet 0   metoprolol succinate (TOPROL-XL) 100 MG 24 hr tablet TAKE 1 TABLET BY MOUTH IN THE MORNING 90 tablet 2   nitroGLYCERIN (NITROSTAT) 0.4 MG SL tablet DISSOLVE ONE TABLET UNDER THE TONGUE EVERY 5 MINUTES AS NEEDED FOR CHEST PAIN.  DO NOT EXCEED A TOTAL OF 3 DOSES IN 15 MINUTES 25 tablet 5   Omega-3 Fatty Acids (FISH OIL PO) Take 1 capsule by mouth daily.      pantoprazole (PROTONIX) 40 MG tablet Take 1 tablet (40 mg total) by mouth daily after lunch. 30 tablet 1   predniSONE (DELTASONE) 1 MG tablet Take 3 tablets by mouth once daily 270 tablet 0   ramipril (ALTACE) 10 MG tablet Take 10 mg by mouth 2 (two) times daily.      ALPRAZolam (XANAX) 0.25 MG tablet Take 0.25 mg by mouth 2 (two) times daily as needed. (Patient not taking: Reported on 05/10/2021)     ondansetron (ZOFRAN ODT) 4 MG disintegrating tablet Take 1 tablet (4 mg total) by mouth every 8 (eight) hours as needed for nausea or vomiting. (Patient not taking: Reported on 05/10/2021) 20 tablet 0   No current facility-administered medications for this visit.    Allergies:   Codeine, Glimepiride, Jardiance [empagliflozin], Metformin and related, and Penicillins    Social History:  The patient  reports that she has  never smoked. She has never used smokeless tobacco. She reports that she does not drink alcohol and does not use drugs.   Family History:  The patient's family history includes Arthritis in her daughter; CAD in her father and mother; Heart Problems in her brother; Heart attack in her mother.    ROS:  Please see the history of present illness.   Otherwise, review of systems are positive for easy bruising.   All other systems are reviewed and negative.    PHYSICAL EXAM: VS:  BP (!) 112/50   Pulse 61   Ht 5\' 2"  (1.575 m)   Wt 181 lb (82.1 kg)   SpO2 91%   BMI 33.11 kg/m  , BMI Body mass index is 33.11 kg/m. GEN: Well nourished, well developed, in no acute distress HEENT: normal Neck: no JVD, carotid bruits, or masses Cardiac: RRR; no murmurs, rubs, or gallops,no edema  Respiratory:  clear to auscultation bilaterally, normal work of breathing GI: soft, nontender, nondistended, + BS MS: no deformity or atrophy Skin: warm and dry, no rash Neuro:  Strength and sensation are intact Psych: euthymic mood, full affect    Recent Labs: 03/19/2021: ALT 17; BUN 24; Creat 0.98; Hemoglobin 12.9; Platelets 351; Potassium 4.0; Sodium 140   Lipid Panel    Component Value Date/Time   CHOL 121 02/25/2020 0223   TRIG 76 02/25/2020 0223   HDL 49 02/25/2020 0223   CHOLHDL 2.5 02/25/2020 0223   VLDL 15 02/25/2020 0223   LDLCALC 57 02/25/2020 0223     Other studies Reviewed: Additional studies/ records that were reviewed today with results demonstrating: labs reviewed.   ASSESSMENT AND PLAN:  CAD: No angina.  Continue aggressive secondary prevention.  S/p  TAVR: No CHF.  SBE prophylaxis. PAF: Gait instability.  Avoid falls.  Eliquis for stroke prevention.  Chronic anemia: Hbg 12.9 in 02/2021.  Would have a low threshold to decrease Eliquis dose to 2.5 mg twice daily if she had any kind of bleeding issues.  Based on kidney function and weight, she still qualifies for the 5 mg twice daily dose  for maximal protection against the stroke.  She is not taking aspirin. HTN: The current medical regimen is effective;  continue present plan and medications. Off of Plavix and  Eliquis currently for back injection tomorrow.    Current medicines are reviewed at length with the patient today.  The patient concerns regarding her medicines were addressed.  The following changes have been made:  No change  Labs/ tests ordered today include:  No orders of the defined types were placed in this encounter.   Recommend 150 minutes/week of aerobic exercise Low fat, low carb, high fiber diet recommended  Disposition:   FU in 8 months   Signed, Larae Grooms, MD  05/10/2021 9:50 AM    [facility] [place], [place], [place]  [address] Phone: [phone]; Fax: [phone]

## 2021-05-10 ENCOUNTER — Encounter: Payer: Self-pay | Admitting: Interventional Cardiology

## 2021-05-10 ENCOUNTER — Ambulatory Visit (INDEPENDENT_AMBULATORY_CARE_PROVIDER_SITE_OTHER): Payer: Medicare Other | Admitting: Interventional Cardiology

## 2021-05-10 ENCOUNTER — Other Ambulatory Visit: Payer: Self-pay

## 2021-05-10 VITALS — BP 112/50 | HR 61 | Ht 62.0 in | Wt 181.0 lb

## 2021-05-10 DIAGNOSIS — Z952 Presence of prosthetic heart valve: Secondary | ICD-10-CM

## 2021-05-10 DIAGNOSIS — I5032 Chronic diastolic (congestive) heart failure: Secondary | ICD-10-CM

## 2021-05-10 DIAGNOSIS — I10 Essential (primary) hypertension: Secondary | ICD-10-CM

## 2021-05-10 DIAGNOSIS — I480 Paroxysmal atrial fibrillation: Secondary | ICD-10-CM | POA: Diagnosis not present

## 2021-05-10 DIAGNOSIS — I2510 Atherosclerotic heart disease of native coronary artery without angina pectoris: Secondary | ICD-10-CM | POA: Diagnosis not present

## 2021-05-10 NOTE — Patient Instructions (Signed)
Medication Instructions:  Your physician recommends that you continue on your current medications as directed. Please refer to the Current Medication list given to you today.  *If you need a refill on your cardiac medications before your next appointment, please call your pharmacy*   Lab Work: none If you have labs (blood work) drawn today and your tests are completely normal, you will receive your results only by: [place] (if you have MyChart) OR A paper copy in the mail If you have any lab test that is abnormal or we need to change your treatment, we will call you to review the results.   Testing/Procedures: none   Follow-Up: At [facility], you and your health needs are our priority.  As part of our continuing mission to provide you with exceptional heart care, we have created designated Provider Care Teams.  These Care Teams include your primary Cardiologist (physician) and Advanced Practice Providers (APPs -  Physician Assistants and Nurse Practitioners) who all work together to provide you with the care you need, when you need it.  We recommend signing up for the patient portal called "MyChart".  Sign up information is provided on this After Visit Summary.  MyChart is used to connect with patients for Virtual Visits (Telemedicine).  Patients are able to view lab/test results, encounter notes, upcoming appointments, etc.  Non-urgent messages can be sent to your provider as well.   To learn more about what you can do with MyChart, go to [email or web address].    Your next appointment:   7-8  month(s)  The format for your next appointment:   In Person  Provider:   Larae Grooms, MD     Other Instructions

## 2021-05-11 DIAGNOSIS — M48062 Spinal stenosis, lumbar region with neurogenic claudication: Secondary | ICD-10-CM | POA: Diagnosis not present

## 2021-06-09 ENCOUNTER — Other Ambulatory Visit: Payer: Self-pay | Admitting: *Deleted

## 2021-06-09 DIAGNOSIS — Z79899 Other long term (current) drug therapy: Secondary | ICD-10-CM

## 2021-06-09 LAB — COMPLETE METABOLIC PANEL WITH GFR
AG Ratio: 1.5 (calc) (ref 1.0–2.5)
ALT: 15 U/L (ref 6–29)
AST: 17 U/L (ref 10–35)
Albumin: 4.1 g/dL (ref 3.6–5.1)
Alkaline phosphatase (APISO): 61 U/L (ref 37–153)
BUN/Creatinine Ratio: 21 (calc) (ref 6–22)
BUN: 26 mg/dL — ABNORMAL HIGH (ref 7–25)
CO2: 31 mmol/L (ref 20–32)
Calcium: 9.6 mg/dL (ref 8.6–10.4)
Chloride: 103 mmol/L (ref 98–110)
Creat: 1.21 mg/dL — ABNORMAL HIGH (ref 0.60–0.95)
Globulin: 2.7 g/dL (calc) (ref 1.9–3.7)
Glucose, Bld: 70 mg/dL (ref 65–99)
Potassium: 4.7 mmol/L (ref 3.5–5.3)
Sodium: 141 mmol/L (ref 135–146)
Total Bilirubin: 0.5 mg/dL (ref 0.2–1.2)
Total Protein: 6.8 g/dL (ref 6.1–8.1)
eGFR: 44 mL/min/{1.73_m2} — ABNORMAL LOW (ref >=60)

## 2021-06-09 LAB — CBC WITH DIFFERENTIAL/PLATELET
Absolute Monocytes: 1863 cells/uL — ABNORMAL HIGH (ref 200–950)
Basophils Absolute: 110 cells/uL (ref 0–200)
Basophils Relative: 0.8 %
Eosinophils Absolute: 534 cells/uL — ABNORMAL HIGH (ref 15–500)
Eosinophils Relative: 3.9 %
HCT: 40.8 % (ref 35.0–45.0)
Hemoglobin: 13.3 g/dL (ref 11.7–15.5)
Lymphs Abs: 1781 cells/uL (ref 850–3900)
MCH: 30.7 pg (ref 27.0–33.0)
MCHC: 32.6 g/dL (ref 32.0–36.0)
MCV: 94.2 fL (ref 80.0–100.0)
MPV: 10 fL (ref 7.5–12.5)
Monocytes Relative: 13.6 %
Neutro Abs: 9412 cells/uL — ABNORMAL HIGH (ref 1500–7800)
Neutrophils Relative %: 68.7 %
Platelets: 317 10*3/uL (ref 140–400)
RBC: 4.33 10*6/uL (ref 3.80–5.10)
RDW: 13.9 % (ref 11.0–15.0)
Total Lymphocyte: 13 %
WBC: 13.7 10*3/uL — ABNORMAL HIGH (ref 3.8–10.8)

## 2021-06-10 ENCOUNTER — Other Ambulatory Visit: Payer: Self-pay | Admitting: *Deleted

## 2021-06-10 MED ORDER — METHOTREXATE SODIUM 2.5 MG PO TABS: ORAL_TABLET | ORAL | 0 refills | Status: DC

## 2021-06-10 NOTE — Progress Notes (Signed)
Creatinine is elevated most likely due to the use of methotrexate and ACE inhibitor's.white cell count is elevated, most likely due to prednisone use.  Please advise patient to reduce methotrexate to 3 tablets p.o. weekly.

## 2021-06-18 DIAGNOSIS — N1831 Chronic kidney disease, stage 3a: Secondary | ICD-10-CM | POA: Diagnosis not present

## 2021-06-18 DIAGNOSIS — M069 Rheumatoid arthritis, unspecified: Secondary | ICD-10-CM | POA: Diagnosis not present

## 2021-06-18 DIAGNOSIS — D6869 Other thrombophilia: Secondary | ICD-10-CM | POA: Diagnosis not present

## 2021-06-18 DIAGNOSIS — I700 Atherosclerosis of aorta: Secondary | ICD-10-CM | POA: Diagnosis not present

## 2021-06-18 DIAGNOSIS — I5030 Unspecified diastolic (congestive) heart failure: Secondary | ICD-10-CM | POA: Diagnosis not present

## 2021-06-18 DIAGNOSIS — E1122 Type 2 diabetes mellitus with diabetic chronic kidney disease: Secondary | ICD-10-CM | POA: Diagnosis not present

## 2021-06-18 DIAGNOSIS — F341 Dysthymic disorder: Secondary | ICD-10-CM | POA: Diagnosis not present

## 2021-06-18 DIAGNOSIS — I25119 Atherosclerotic heart disease of native coronary artery with unspecified angina pectoris: Secondary | ICD-10-CM | POA: Diagnosis not present

## 2021-06-18 DIAGNOSIS — I252 Old myocardial infarction: Secondary | ICD-10-CM | POA: Diagnosis not present

## 2021-06-18 DIAGNOSIS — I480 Paroxysmal atrial fibrillation: Secondary | ICD-10-CM | POA: Diagnosis not present

## 2021-06-18 DIAGNOSIS — E782 Mixed hyperlipidemia: Secondary | ICD-10-CM | POA: Diagnosis not present

## 2021-06-18 DIAGNOSIS — I10 Essential (primary) hypertension: Secondary | ICD-10-CM | POA: Diagnosis not present

## 2021-06-21 ENCOUNTER — Other Ambulatory Visit: Payer: Self-pay | Admitting: Interventional Cardiology

## 2021-06-21 DIAGNOSIS — I480 Paroxysmal atrial fibrillation: Secondary | ICD-10-CM

## 2021-06-21 DIAGNOSIS — Z952 Presence of prosthetic heart valve: Secondary | ICD-10-CM

## 2021-06-21 NOTE — Telephone Encounter (Signed)
Eliquis 5mg  refill request received. Patient is 88 years old, weight-82.1kg, Crea-1.21 on 06/09/2021, Diagnosis-Afib, and last seen by Dr. Irish Lack on 05/10/2021. Dose is appropriate based on dosing criteria. Will send in refill to requested pharmacy.

## 2021-07-15 ENCOUNTER — Other Ambulatory Visit: Payer: Self-pay

## 2021-07-15 MED ORDER — PREDNISONE 1 MG PO TABS: 3.0000 mg | ORAL_TABLET | Freq: Every day | ORAL | 0 refills | Status: DC

## 2021-07-15 MED ORDER — METHOTREXATE SODIUM 2.5 MG PO TABS: ORAL_TABLET | ORAL | 0 refills | Status: DC

## 2021-07-15 NOTE — Telephone Encounter (Signed)
Next Visit: 08/13/2021   Last Visit: 03/19/2021   Last Fill: 04/20/2021 (MTX) 03/25/2021 (Prednisone)   DX: Rheumatoid arthritis involving multiple sites with positive rheumatoid factor    Current Dose per lab note 06/09/2021: advise patient to reduce methotrexate to 3 tablets p.o. weekly.  Labs: 06/09/2021 Creatinine is elevated most likely due to the use of methotrexate and ACE inhibitor's.white cell count is elevated, most likely due to prednisone use.    Okay to refill MTX and Prednisone?

## 2021-07-15 NOTE — Telephone Encounter (Signed)
Patient called requesting prescription refills of Prednisone and Methotrexate to be sent to [facility] in [facility].

## 2021-07-30 NOTE — Progress Notes (Unsigned)
Office Visit Note  Patient: Sophia Ward             Date of Birth: [birth year]           MRN: [id number]             PCP: [name], MD Referring: [name], MD Visit Date: 08/13/2021 Occupation: @GUAROCC @  Subjective:  No chief complaint on file.   History of Present Illness: [name] is a 88 y.o. female ***   Activities of Daily Living:  Patient reports morning stiffness for *** {minute/hour:19697}.   Patient {ACTIONS;DENIES/REPORTS:21021675::"Denies"} nocturnal pain.  Difficulty dressing/grooming: {ACTIONS;DENIES/REPORTS:21021675::"Denies"} Difficulty climbing stairs: {ACTIONS;DENIES/REPORTS:21021675::"Denies"} Difficulty getting out of chair: {ACTIONS;DENIES/REPORTS:21021675::"Denies"} Difficulty using hands for taps, buttons, cutlery, and/or writing: {ACTIONS;DENIES/REPORTS:21021675::"Denies"}  No Rheumatology ROS completed.   PMFS History:  Patient Active Problem List   Diagnosis Date Noted   Acute on chronic diastolic heart failure ([place]) 03/04/2020   S/P TAVR (transcatheter aortic valve replacement) 03/03/2020   Diabetes mellitus without complication (HCC)    Pulmonary nodules    Lesion of pancreas    Severe aortic stenosis    Pressure injury of skin 01/15/2020   Iron deficiency anemia 01/06/2020   Symptomatic anemia 10/23/2019   Anemia 10/22/2019   Polymyalgia rheumatica ([place]) 11/25/2016   Primary osteoarthritis of both hands 11/25/2016   Bilateral primary osteoarthritis of knee 11/25/2016   Osteopenia of multiple sites 11/25/2016   Chronic gout involving toe without tophus 10/28/2016   Osteoarthritis of lumbar spine 10/28/2016   History of gastroesophageal reflux (GERD) 10/27/2016   Rheumatoid arthritis involving multiple sites with positive rheumatoid factor ([place]) 10/14/2016   Coronary atherosclerosis of native coronary artery 01/30/2014   Mixed hyperlipidemia 01/30/2014   Essential hypertension, benign 01/30/2014   Obesity,  unspecified 01/30/2014    Past Medical History:  Diagnosis Date   Allergic rhinitis    Anemia 09/2019   Arthritis    hands   BPPV (benign paroxysmal positional vertigo)    Coronary artery disease    Diabetes mellitus without complication ([place])    Essential hypertension, benign 01/30/2014   GERD (gastroesophageal reflux disease)    Hypertension    Lesion of pancreas    Mixed hyperlipidemia 01/30/2014   Myocardial infarction ([place]) 2004   mild MI-no damage- had stents   Osteopenia    Post-menopausal    Pulmonary nodules    Rheumatoid arthritis ([place])    S/P TAVR (transcatheter aortic valve replacement) 03/03/2020   s/p TAVR with a 23 mm Edwards S3U via the TF approach by Drs Burt Knack and Roxy Manns   Severe aortic stenosis    Spinal stenosis    Stenosing tenosynovitis     Family History  Problem Relation Age of Onset   CAD Mother    Heart attack Mother    CAD Father    Heart Problems Brother        open heart surgery    Arthritis Daughter        psoriatic arthritis    Past Surgical History:  Procedure Laterality Date   ABDOMINAL HYSTERECTOMY     APPENDECTOMY     BIOPSY  10/25/2019   Procedure: BIOPSY;  Surgeon: Otis Brace, MD;  Location: [facility];  Service: Gastroenterology;;   BIOPSY  10/26/2019   Procedure: BIOPSY;  Surgeon: Otis Brace, MD;  Location: [facility];  Service: Gastroenterology;;   CARDIAC CATHETERIZATION  2004   carpel tunnel     COLONOSCOPY WITH PROPOFOL N/A 10/30/2012   Procedure: COLONOSCOPY WITH  PROPOFOL;  Surgeon: Garlan Fair, MD;  Location: [facility];  Service: Endoscopy;  Laterality: N/A;   COLONOSCOPY WITH PROPOFOL N/A 10/26/2019   Procedure: COLONOSCOPY WITH PROPOFOL;  Surgeon: Otis Brace, MD;  Location: [facility];  Service: Gastroenterology;  Laterality: N/A;   CORONARY ANGIOPLASTY  2004   CORONARY ATHERECTOMY N/A 02/21/2020   Procedure: CORONARY ATHERECTOMY;  Surgeon: Jettie Booze, MD;  Location: [facility];  Service: Cardiovascular;  Laterality: N/A;   CORONARY STENT INTERVENTION N/A 02/21/2020   Procedure: CORONARY STENT INTERVENTION;  Surgeon: Jettie Booze, MD;  Location: [facility];  Service: Cardiovascular;  Laterality: N/A;   DILATION AND CURETTAGE OF UTERUS     ESOPHAGOGASTRODUODENOSCOPY N/A 10/25/2019   Procedure: ESOPHAGOGASTRODUODENOSCOPY (EGD);  Surgeon: Otis Brace, MD;  Location: [facility];  Service: Gastroenterology;  Laterality: N/A;   ESOPHAGOGASTRODUODENOSCOPY (EGD) WITH PROPOFOL N/A 10/30/2012   Procedure: ESOPHAGOGASTRODUODENOSCOPY (EGD) WITH PROPOFOL;  Surgeon: Garlan Fair, MD;  Location: [facility];  Service: Endoscopy;  Laterality: N/A;   EYE SURGERY     bilateral cataract with lens implant   EYE SURGERY     INJECTION OF SILICONE OIL Left 2/42/6834   Procedure: Injection Of Silicone Oil;  Surgeon: Jalene Mullet, MD;  Location: [facility];  Service: Ophthalmology;  Laterality: Left;   INTRAVASCULAR PRESSURE WIRE/FFR STUDY N/A 01/29/2020   Procedure: INTRAVASCULAR PRESSURE WIRE/FFR STUDY;  Surgeon: Jettie Booze, MD;  Location: [facility];  Service: Cardiovascular;  Laterality: N/A;   INTRAVASCULAR ULTRASOUND/IVUS N/A 02/21/2020   Procedure: Intravascular Ultrasound/IVUS;  Surgeon: Jettie Booze, MD;  Location: [facility];  Service: Cardiovascular;  Laterality: N/A;   left shouler surgery     PHOTOCOAGULATION WITH LASER Left 02/12/2019   Procedure: Photocoagulation With Laser;  Surgeon: Jalene Mullet, MD;  Location: [facility];  Service: Ophthalmology;  Laterality: Left;   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 02/12/2019   Procedure: REPAIR OF COMPLEX TRACTION RETINAL DETACHMENT;  Surgeon: Jalene Mullet, MD;  Location: [facility];  Service: Ophthalmology;  Laterality: Left;   RIGHT/LEFT HEART CATH AND CORONARY ANGIOGRAPHY N/A 01/29/2020   Procedure: RIGHT/LEFT HEART CATH AND CORONARY ANGIOGRAPHY;  Surgeon: Jettie Booze, MD;  Location: [facility];  Service: Cardiovascular;  Laterality: N/A;   TEE WITHOUT CARDIOVERSION N/A 03/03/2020   Procedure: TRANSESOPHAGEAL ECHOCARDIOGRAM (TEE);  Surgeon: Sherren Mocha, MD;  Location: [facility];  Service: Open Heart Surgery;  Laterality: N/A;   TONSILLECTOMY     TRANSCATHETER AORTIC VALVE REPLACEMENT, TRANSFEMORAL  03/03/2020   TRANSCATHETER AORTIC VALVE REPLACEMENT, TRANSFEMORAL N/A 03/03/2020   Procedure: TRANSCATHETER AORTIC VALVE REPLACEMENT, TRANSFEMORAL USING EDWARDS SAPIEN 3 23 MM AORTIC VALVE.;  Surgeon: Sherren Mocha, MD;  Location: [facility];  Service: Open Heart Surgery;  Laterality: N/A;   VITRECTOMY 25 GAUGE WITH SCLERAL BUCKLE Left 02/12/2019   Procedure: Vitrectomy 25 Gauge With Scleral Buckle;  Surgeon: Jalene Mullet, MD;  Location: [facility];  Service: Ophthalmology;  Laterality: Left;   Social History   Social History Narrative   Not on file   Immunization History  Administered Date(s) Administered   Fluad Quad(high Dose 65+) 03/04/2020   Influenza, High Dose Seasonal PF 03/09/2014, 04/09/2015, 03/16/2016, 02/25/2017, 03/20/2018   PFIZER(Purple Top)SARS-COV-2 Vaccination 06/11/2019, 07/01/2019, 03/19/2020   Zoster Recombinat (Shingrix) 02/07/2018, 06/04/2018     Objective: Vital Signs: There were no vitals taken for this visit.   Physical Exam   Musculoskeletal Exam: ***  CDAI Exam: CDAI Score: --  Patient Global: --; Provider Global: -- Swollen: --; Tender: -- Joint Exam 08/13/2021   No joint exam has been documented for this visit   There is currently no information documented on the homunculus. Go to the Rheumatology activity and complete the homunculus joint exam.  Investigation: No additional findings.  Imaging: No results found.  Recent Labs: Lab Results  Component Value Date   WBC 13.7 (H) 06/09/2021   HGB 13.3 06/09/2021   PLT 317 06/09/2021   NA 141 06/09/2021   K 4.7 06/09/2021   CL 103 06/09/2021   CO2  31 06/09/2021   GLUCOSE 70 06/09/2021   BUN 26 (H) 06/09/2021   CREATININE 1.21 (H) 06/09/2021   BILITOT 0.5 06/09/2021   ALKPHOS 64 10/21/2020   AST 17 06/09/2021   ALT 15 06/09/2021   PROT 6.8 06/09/2021   ALBUMIN 3.5 10/21/2020   CALCIUM 9.6 06/09/2021   GFRAA 66 10/16/2020    Speciality Comments: No specialty comments available.  Procedures:  No procedures performed Allergies: Codeine, Glimepiride, Jardiance [empagliflozin], Metformin and related, and Penicillins   Assessment / Plan:     Visit Diagnoses: Rheumatoid arthritis involving multiple sites with positive rheumatoid factor (HCC)  High risk medication use  Polymyalgia rheumatica (HCC)  Primary osteoarthritis of both hands  Primary osteoarthritis of both knees  Primary osteoarthritis of both feet  History of humerus fracture  DDD (degenerative disc disease), lumbar  Osteopenia of multiple sites  Chronic anticoagulation  History of diabetes mellitus  History of hyperlipidemia  History of hypertension  History of gastroesophageal reflux (GERD)  Orders: No orders of the defined types were placed in this encounter.  No orders of the defined types were placed in this encounter.   Face-to-face time spent with patient was *** minutes. Greater than 50% of time was spent in counseling and coordination of care.  Follow-Up Instructions: No follow-ups on file.   Ofilia Neas, PA-C  Note - This record has been created using Dragon software.  Chart creation errors have been sought, but may not always  have been located. Such creation errors do not reflect on  the standard of medical care.

## 2021-08-13 ENCOUNTER — Encounter: Payer: Self-pay | Admitting: Physician Assistant

## 2021-08-13 ENCOUNTER — Other Ambulatory Visit: Payer: Self-pay

## 2021-08-13 ENCOUNTER — Ambulatory Visit (INDEPENDENT_AMBULATORY_CARE_PROVIDER_SITE_OTHER): Payer: Medicare Other | Admitting: Physician Assistant

## 2021-08-13 VITALS — BP 136/74 | HR 59 | Ht 62.0 in | Wt 180.4 lb

## 2021-08-13 DIAGNOSIS — Z8679 Personal history of other diseases of the circulatory system: Secondary | ICD-10-CM | POA: Diagnosis not present

## 2021-08-13 DIAGNOSIS — M19042 Primary osteoarthritis, left hand: Secondary | ICD-10-CM

## 2021-08-13 DIAGNOSIS — M0579 Rheumatoid arthritis with rheumatoid factor of multiple sites without organ or systems involvement: Secondary | ICD-10-CM

## 2021-08-13 DIAGNOSIS — M353 Polymyalgia rheumatica: Secondary | ICD-10-CM

## 2021-08-13 DIAGNOSIS — M19041 Primary osteoarthritis, right hand: Secondary | ICD-10-CM | POA: Diagnosis not present

## 2021-08-13 DIAGNOSIS — M170 Bilateral primary osteoarthritis of knee: Secondary | ICD-10-CM | POA: Diagnosis not present

## 2021-08-13 DIAGNOSIS — M19071 Primary osteoarthritis, right ankle and foot: Secondary | ICD-10-CM

## 2021-08-13 DIAGNOSIS — M8589 Other specified disorders of bone density and structure, multiple sites: Secondary | ICD-10-CM | POA: Diagnosis not present

## 2021-08-13 DIAGNOSIS — Z79899 Other long term (current) drug therapy: Secondary | ICD-10-CM | POA: Diagnosis not present

## 2021-08-13 DIAGNOSIS — Z8719 Personal history of other diseases of the digestive system: Secondary | ICD-10-CM

## 2021-08-13 DIAGNOSIS — M5136 Other intervertebral disc degeneration, lumbar region: Secondary | ICD-10-CM

## 2021-08-13 DIAGNOSIS — Z7901 Long term (current) use of anticoagulants: Secondary | ICD-10-CM

## 2021-08-13 DIAGNOSIS — Z8639 Personal history of other endocrine, nutritional and metabolic disease: Secondary | ICD-10-CM | POA: Diagnosis not present

## 2021-08-13 DIAGNOSIS — M19072 Primary osteoarthritis, left ankle and foot: Secondary | ICD-10-CM

## 2021-08-13 DIAGNOSIS — Z8781 Personal history of (healed) traumatic fracture: Secondary | ICD-10-CM | POA: Diagnosis not present

## 2021-09-14 DIAGNOSIS — I2510 Atherosclerotic heart disease of native coronary artery without angina pectoris: Secondary | ICD-10-CM | POA: Diagnosis not present

## 2021-09-14 DIAGNOSIS — E1122 Type 2 diabetes mellitus with diabetic chronic kidney disease: Secondary | ICD-10-CM | POA: Diagnosis not present

## 2021-09-14 DIAGNOSIS — E119 Type 2 diabetes mellitus without complications: Secondary | ICD-10-CM | POA: Diagnosis not present

## 2021-09-14 DIAGNOSIS — Z794 Long term (current) use of insulin: Secondary | ICD-10-CM | POA: Diagnosis not present

## 2021-09-20 ENCOUNTER — Other Ambulatory Visit: Payer: Self-pay

## 2021-09-20 DIAGNOSIS — Z79899 Other long term (current) drug therapy: Secondary | ICD-10-CM | POA: Diagnosis not present

## 2021-09-20 LAB — COMPLETE METABOLIC PANEL WITH GFR
AG Ratio: 1.5 (calc) (ref 1.0–2.5)
ALT: 13 U/L (ref 6–29)
AST: 17 U/L (ref 10–35)
Albumin: 4 g/dL (ref 3.6–5.1)
Alkaline phosphatase (APISO): 59 U/L (ref 37–153)
BUN/Creatinine Ratio: 17 (calc) (ref 6–22)
BUN: 19 mg/dL (ref 7–25)
CO2: 28 mmol/L (ref 20–32)
Calcium: 9.4 mg/dL (ref 8.6–10.4)
Chloride: 105 mmol/L (ref 98–110)
Creat: 1.11 mg/dL — ABNORMAL HIGH (ref 0.60–0.95)
Globulin: 2.7 g/dL (calc) (ref 1.9–3.7)
Glucose, Bld: 96 mg/dL (ref 65–99)
Potassium: 4.7 mmol/L (ref 3.5–5.3)
Sodium: 140 mmol/L (ref 135–146)
Total Bilirubin: 0.5 mg/dL (ref 0.2–1.2)
Total Protein: 6.7 g/dL (ref 6.1–8.1)
eGFR: 48 mL/min/{1.73_m2} — ABNORMAL LOW (ref >=60)

## 2021-09-20 LAB — CBC WITH DIFFERENTIAL/PLATELET
Absolute Monocytes: 1648 cells/uL — ABNORMAL HIGH (ref 200–950)
Basophils Absolute: 92 cells/uL (ref 0–200)
Basophils Relative: 0.6 %
Eosinophils Absolute: 554 cells/uL — ABNORMAL HIGH (ref 15–500)
Eosinophils Relative: 3.6 %
HCT: 39 % (ref 35.0–45.0)
Hemoglobin: 12.9 g/dL (ref 11.7–15.5)
Lymphs Abs: 1555 cells/uL (ref 850–3900)
MCH: 31.2 pg (ref 27.0–33.0)
MCHC: 33.1 g/dL (ref 32.0–36.0)
MCV: 94.2 fL (ref 80.0–100.0)
MPV: 10.2 fL (ref 7.5–12.5)
Monocytes Relative: 10.7 %
Neutro Abs: 11550 cells/uL — ABNORMAL HIGH (ref 1500–7800)
Neutrophils Relative %: 75 %
Platelets: 304 10*3/uL (ref 140–400)
RBC: 4.14 10*6/uL (ref 3.80–5.10)
RDW: 13.1 % (ref 11.0–15.0)
Total Lymphocyte: 10.1 %
WBC: 15.4 10*3/uL — ABNORMAL HIGH (ref 3.8–10.8)

## 2021-09-21 NOTE — Progress Notes (Signed)
Creatinine is elevated and stable.  White cell count is elevated, most likely due to chronic prednisone use.

## 2021-09-23 DIAGNOSIS — M48062 Spinal stenosis, lumbar region with neurogenic claudication: Secondary | ICD-10-CM | POA: Diagnosis not present

## 2021-09-24 DIAGNOSIS — Z20822 Contact with and (suspected) exposure to covid-19: Secondary | ICD-10-CM | POA: Diagnosis not present

## 2021-10-07 ENCOUNTER — Other Ambulatory Visit: Payer: Self-pay | Admitting: Rheumatology

## 2021-10-07 MED ORDER — PREDNISONE 1 MG PO TABS
3.0000 mg | ORAL_TABLET | Freq: Every day | ORAL | 0 refills | Status: DC
Start: 2021-10-07 — End: 2022-01-03

## 2021-10-07 MED ORDER — METHOTREXATE SODIUM 2.5 MG PO TABS: ORAL_TABLET | ORAL | 0 refills | Status: DC

## 2021-10-07 NOTE — Telephone Encounter (Signed)
Patient called requesting prescription refills for Methotrexate and Prednisone to be sent to [facility] in [facility].   ?

## 2021-10-07 NOTE — Telephone Encounter (Signed)
Next Visit: 01/19/2022 ? ?Last Visit: 08/13/2021 ? ?Last Fill: 07/15/2021 ? ?DX: Rheumatoid arthritis involving multiple sites with positive rheumatoid factor  ? ?Current Dose per office note 08/13/2021: Methotrexate 2.5 mg 3 tablets by mouth every 7 days, prednisone 3 mg po daily ? ?Labs: 09/20/2021 Creatinine is elevated and stable.  White cell count is elevated, most likely due to chronic prednisone use. ? ?Okay to refill MTX and Prednisone?  ?

## 2021-10-11 DIAGNOSIS — N390 Urinary tract infection, site not specified: Secondary | ICD-10-CM | POA: Diagnosis not present

## 2021-10-12 DIAGNOSIS — L821 Other seborrheic keratosis: Secondary | ICD-10-CM | POA: Diagnosis not present

## 2021-10-12 DIAGNOSIS — D485 Neoplasm of uncertain behavior of skin: Secondary | ICD-10-CM | POA: Diagnosis not present

## 2021-10-12 DIAGNOSIS — L570 Actinic keratosis: Secondary | ICD-10-CM | POA: Diagnosis not present

## 2021-10-12 DIAGNOSIS — D0461 Carcinoma in situ of skin of right upper limb, including shoulder: Secondary | ICD-10-CM | POA: Diagnosis not present

## 2021-10-12 DIAGNOSIS — Z85828 Personal history of other malignant neoplasm of skin: Secondary | ICD-10-CM | POA: Diagnosis not present

## 2021-10-12 DIAGNOSIS — C44622 Squamous cell carcinoma of skin of right upper limb, including shoulder: Secondary | ICD-10-CM | POA: Diagnosis not present

## 2021-11-01 DIAGNOSIS — E119 Type 2 diabetes mellitus without complications: Secondary | ICD-10-CM | POA: Diagnosis not present

## 2021-11-01 DIAGNOSIS — H40013 Open angle with borderline findings, low risk, bilateral: Secondary | ICD-10-CM | POA: Diagnosis not present

## 2021-11-01 DIAGNOSIS — H524 Presbyopia: Secondary | ICD-10-CM | POA: Diagnosis not present

## 2021-11-01 DIAGNOSIS — H3581 Retinal edema: Secondary | ICD-10-CM | POA: Diagnosis not present

## 2021-11-01 DIAGNOSIS — H33042 Retinal detachment with retinal dialysis, left eye: Secondary | ICD-10-CM | POA: Diagnosis not present

## 2021-11-09 DIAGNOSIS — N1831 Chronic kidney disease, stage 3a: Secondary | ICD-10-CM | POA: Diagnosis not present

## 2021-11-09 DIAGNOSIS — M48061 Spinal stenosis, lumbar region without neurogenic claudication: Secondary | ICD-10-CM | POA: Diagnosis not present

## 2021-11-09 DIAGNOSIS — I2510 Atherosclerotic heart disease of native coronary artery without angina pectoris: Secondary | ICD-10-CM | POA: Diagnosis not present

## 2021-11-09 DIAGNOSIS — M109 Gout, unspecified: Secondary | ICD-10-CM | POA: Diagnosis not present

## 2021-11-09 DIAGNOSIS — I5030 Unspecified diastolic (congestive) heart failure: Secondary | ICD-10-CM | POA: Diagnosis not present

## 2021-11-09 DIAGNOSIS — E782 Mixed hyperlipidemia: Secondary | ICD-10-CM | POA: Diagnosis not present

## 2021-11-09 DIAGNOSIS — I10 Essential (primary) hypertension: Secondary | ICD-10-CM | POA: Diagnosis not present

## 2021-11-09 DIAGNOSIS — F341 Dysthymic disorder: Secondary | ICD-10-CM | POA: Diagnosis not present

## 2021-11-09 DIAGNOSIS — M069 Rheumatoid arthritis, unspecified: Secondary | ICD-10-CM | POA: Diagnosis not present

## 2021-11-09 DIAGNOSIS — Z0000 Encounter for general adult medical examination without abnormal findings: Secondary | ICD-10-CM | POA: Diagnosis not present

## 2021-11-09 DIAGNOSIS — I700 Atherosclerosis of aorta: Secondary | ICD-10-CM | POA: Diagnosis not present

## 2021-11-09 DIAGNOSIS — Z1331 Encounter for screening for depression: Secondary | ICD-10-CM | POA: Diagnosis not present

## 2021-11-09 DIAGNOSIS — D6869 Other thrombophilia: Secondary | ICD-10-CM | POA: Diagnosis not present

## 2021-11-09 DIAGNOSIS — E1122 Type 2 diabetes mellitus with diabetic chronic kidney disease: Secondary | ICD-10-CM | POA: Diagnosis not present

## 2021-11-29 DIAGNOSIS — H338 Other retinal detachments: Secondary | ICD-10-CM | POA: Diagnosis not present

## 2021-11-29 DIAGNOSIS — H35352 Cystoid macular degeneration, left eye: Secondary | ICD-10-CM | POA: Diagnosis not present

## 2021-11-29 DIAGNOSIS — Z961 Presence of intraocular lens: Secondary | ICD-10-CM | POA: Diagnosis not present

## 2021-11-29 DIAGNOSIS — H59812 Chorioretinal scars after surgery for detachment, left eye: Secondary | ICD-10-CM | POA: Diagnosis not present

## 2021-11-29 DIAGNOSIS — H3561 Retinal hemorrhage, right eye: Secondary | ICD-10-CM | POA: Diagnosis not present

## 2021-11-29 DIAGNOSIS — H35373 Puckering of macula, bilateral: Secondary | ICD-10-CM | POA: Diagnosis not present

## 2021-12-11 ENCOUNTER — Other Ambulatory Visit: Payer: Self-pay | Admitting: Interventional Cardiology

## 2021-12-11 DIAGNOSIS — I480 Paroxysmal atrial fibrillation: Secondary | ICD-10-CM

## 2021-12-13 NOTE — Telephone Encounter (Signed)
Prescription refill request for Eliquis received. Indication:Afib Last office visit:12/22 Scr:1.1 Age: 88 Weight:81.8 kg  Prescription refilled

## 2022-01-03 ENCOUNTER — Other Ambulatory Visit: Payer: Self-pay | Admitting: Rheumatology

## 2022-01-03 MED ORDER — METHOTREXATE SODIUM 2.5 MG PO TABS: ORAL_TABLET | ORAL | 0 refills | Status: DC

## 2022-01-03 MED ORDER — PREDNISONE 1 MG PO TABS: 3.0000 mg | ORAL_TABLET | Freq: Every day | ORAL | 0 refills | Status: DC

## 2022-01-03 NOTE — Telephone Encounter (Signed)
Next Visit: 01/19/2022   Last Visit: 08/13/2021   Last Fill: 10/07/2021   DX: Rheumatoid arthritis involving multiple sites with positive rheumatoid factor    Current Dose per office note 08/13/2021: Methotrexate 2.5 mg 3 tablets by mouth every 7 days, prednisone 3 mg po daily   Labs: 09/20/2021 Creatinine is elevated and stable.  White cell count is elevated, most likely due to chronic prednisone use.  Patient to update labs at upcoming appointment on 01/19/2022   Okay to refill MTX and Prednisone?

## 2022-01-03 NOTE — Telephone Encounter (Signed)
Patient called requesting prescription refills of Methotrexate and Prednisone to be sent to [facility].

## 2022-01-06 NOTE — Progress Notes (Signed)
Office Visit Note  Patient: Sophia Ward             Date of Birth: [birth year]           MRN: [id number]             PCP: [name], MD Referring: [name], MD Visit Date: 01/19/2022 Occupation: '@GUAROCC'$ @  Subjective:  Medication management  History of Present Illness: Sophia Ward is a 88 y.o. female with history of seropositive rheumatoid arthritis, polymyalgia rheumatica and osteoarthritis.  She has been taking methotrexate 3 tablets p.o. weekly and prednisone 3 mg p.o. daily.  She is unable to reduce the dose of prednisone.  She continues to have some joint stiffness in her hands and her knee joints.  She has not noticed any joint swelling.  She states that she had lumbar spine injection by Dr. Ronalee Red yesterday which was very effective.  She noticed improvement in the lower back pain.  Activities of Daily Living:  Patient reports morning stiffness for 5 hours.   Patient Denies nocturnal pain.  Difficulty dressing/grooming: Denies Difficulty climbing stairs: Reports Difficulty getting out of chair: Reports Difficulty using hands for taps, buttons, cutlery, and/or writing: Reports  Review of Systems  Constitutional:  Positive for fatigue.  HENT:  Positive for mouth sores and mouth dryness.   Eyes:  Negative for dryness.  Respiratory:  Positive for shortness of breath.   Cardiovascular:  Negative for chest pain and palpitations.  Gastrointestinal:  Positive for diarrhea. Negative for blood in stool and constipation.  Endocrine: Negative for increased urination.  Genitourinary:  Negative for involuntary urination.  Musculoskeletal:  Positive for myalgias, morning stiffness and myalgias. Negative for joint pain, gait problem, joint pain, joint swelling, muscle weakness and muscle tenderness.  Skin:  Positive for hair loss. Negative for color change, rash and sensitivity to sunlight.  Allergic/Immunologic: Positive for susceptible to infections.   Neurological:  Negative for dizziness and headaches.  Hematological:  Negative for swollen glands.  Psychiatric/Behavioral:  Negative for depressed mood and sleep disturbance. The patient is not nervous/anxious.     PMFS History:  Patient Active Problem List   Diagnosis Date Noted   Acute on chronic diastolic heart failure ([place]) 03/04/2020   S/P TAVR (transcatheter aortic valve replacement) 03/03/2020   Diabetes mellitus without complication (HCC)    Pulmonary nodules    Lesion of pancreas    Severe aortic stenosis    Pressure injury of skin 01/15/2020   Iron deficiency anemia 01/06/2020   Symptomatic anemia 10/23/2019   Anemia 10/22/2019   Polymyalgia rheumatica ([place]) 11/25/2016   Primary osteoarthritis of both hands 11/25/2016   Bilateral primary osteoarthritis of knee 11/25/2016   Osteopenia of multiple sites 11/25/2016   Chronic gout involving toe without tophus 10/28/2016   Osteoarthritis of lumbar spine 10/28/2016   History of gastroesophageal reflux (GERD) 10/27/2016   Rheumatoid arthritis involving multiple sites with positive rheumatoid factor ([place]) 10/14/2016   Coronary atherosclerosis of native coronary artery 01/30/2014   Mixed hyperlipidemia 01/30/2014   Essential hypertension, benign 01/30/2014   Obesity, unspecified 01/30/2014    Past Medical History:  Diagnosis Date   Allergic rhinitis    Anemia 09/2019   Arthritis    hands   BPPV (benign paroxysmal positional vertigo)    Coronary artery disease    Diabetes mellitus without complication ([place])    Essential hypertension, benign 01/30/2014   GERD (gastroesophageal reflux disease)    Hypertension    Lesion of pancreas  Mixed hyperlipidemia 01/30/2014   Myocardial infarction ([place]) 2004   mild MI-no damage- had stents   Osteopenia    Post-menopausal    Pulmonary nodules    Rheumatoid arthritis (HCC)    S/P TAVR (transcatheter aortic valve replacement) 03/03/2020   s/p TAVR with a 23 mm Edwards S3U via the  TF approach by Drs Burt Knack and Roxy Manns   Severe aortic stenosis    Spinal stenosis    Stenosing tenosynovitis     Family History  Problem Relation Age of Onset   CAD Mother    Heart attack Mother    CAD Father    Heart Problems Brother        open heart surgery    Arthritis Daughter        psoriatic arthritis    Past Surgical History:  Procedure Laterality Date   ABDOMINAL HYSTERECTOMY     APPENDECTOMY     BIOPSY  10/25/2019   Procedure: BIOPSY;  Surgeon: Otis Brace, MD;  Location: [facility];  Service: Gastroenterology;;   BIOPSY  10/26/2019   Procedure: BIOPSY;  Surgeon: Otis Brace, MD;  Location: [facility];  Service: Gastroenterology;;   CARDIAC CATHETERIZATION  2004   carpel tunnel     COLONOSCOPY WITH PROPOFOL N/A 10/30/2012   Procedure: COLONOSCOPY WITH PROPOFOL;  Surgeon: Garlan Fair, MD;  Location: [facility];  Service: Endoscopy;  Laterality: N/A;   COLONOSCOPY WITH PROPOFOL N/A 10/26/2019   Procedure: COLONOSCOPY WITH PROPOFOL;  Surgeon: Otis Brace, MD;  Location: [facility];  Service: Gastroenterology;  Laterality: N/A;   CORONARY ANGIOPLASTY  2004   CORONARY ATHERECTOMY N/A 02/21/2020   Procedure: CORONARY ATHERECTOMY;  Surgeon: Jettie Booze, MD;  Location: [facility];  Service: Cardiovascular;  Laterality: N/A;   CORONARY STENT INTERVENTION N/A 02/21/2020   Procedure: CORONARY STENT INTERVENTION;  Surgeon: Jettie Booze, MD;  Location: [facility];  Service: Cardiovascular;  Laterality: N/A;   DILATION AND CURETTAGE OF UTERUS     ESOPHAGOGASTRODUODENOSCOPY N/A 10/25/2019   Procedure: ESOPHAGOGASTRODUODENOSCOPY (EGD);  Surgeon: Otis Brace, MD;  Location: [facility];  Service: Gastroenterology;  Laterality: N/A;   ESOPHAGOGASTRODUODENOSCOPY (EGD) WITH PROPOFOL N/A 10/30/2012   Procedure: ESOPHAGOGASTRODUODENOSCOPY (EGD) WITH PROPOFOL;  Surgeon: Garlan Fair, MD;  Location: [facility];  Service:  Endoscopy;  Laterality: N/A;   EYE SURGERY     bilateral cataract with lens implant   EYE SURGERY     INJECTION OF SILICONE OIL Left 08/20/4008   Procedure: Injection Of Silicone Oil;  Surgeon: Jalene Mullet, MD;  Location: [facility];  Service: Ophthalmology;  Laterality: Left;   INTRAVASCULAR PRESSURE WIRE/FFR STUDY N/A 01/29/2020   Procedure: INTRAVASCULAR PRESSURE WIRE/FFR STUDY;  Surgeon: Jettie Booze, MD;  Location: [facility];  Service: Cardiovascular;  Laterality: N/A;   INTRAVASCULAR ULTRASOUND/IVUS N/A 02/21/2020   Procedure: Intravascular Ultrasound/IVUS;  Surgeon: Jettie Booze, MD;  Location: [place] [facility];  Service: Cardiovascular;  Laterality: N/A;   left shouler surgery     PHOTOCOAGULATION WITH LASER Left 02/12/2019   Procedure: Photocoagulation With Laser;  Surgeon: Jalene Mullet, MD;  Location: [facility];  Service: Ophthalmology;  Laterality: Left;   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 02/12/2019   Procedure: REPAIR OF COMPLEX TRACTION RETINAL DETACHMENT;  Surgeon: Jalene Mullet, MD;  Location: [facility];  Service: Ophthalmology;  Laterality: Left;   RIGHT/LEFT HEART CATH AND CORONARY ANGIOGRAPHY N/A 01/29/2020   Procedure: RIGHT/LEFT HEART CATH AND CORONARY ANGIOGRAPHY;  Surgeon: Jettie Booze, MD;  Location: [facility];  Service: Cardiovascular;  Laterality: N/A;   TEE WITHOUT CARDIOVERSION N/A 03/03/2020   Procedure: TRANSESOPHAGEAL ECHOCARDIOGRAM (TEE);  Surgeon: Sherren Mocha, MD;  Location: [facility];  Service: Open Heart Surgery;  Laterality: N/A;   TONSILLECTOMY     TRANSCATHETER AORTIC VALVE REPLACEMENT, TRANSFEMORAL  03/03/2020   TRANSCATHETER AORTIC VALVE REPLACEMENT, TRANSFEMORAL N/A 03/03/2020   Procedure: TRANSCATHETER AORTIC VALVE REPLACEMENT, TRANSFEMORAL USING EDWARDS SAPIEN 3 23 MM AORTIC VALVE.;  Surgeon: Sherren Mocha, MD;  Location: [facility];  Service: Open Heart Surgery;  Laterality: N/A;   VITRECTOMY 25 GAUGE WITH  SCLERAL BUCKLE Left 02/12/2019   Procedure: Vitrectomy 25 Gauge With Scleral Buckle;  Surgeon: Jalene Mullet, MD;  Location: [facility];  Service: Ophthalmology;  Laterality: Left;   Social History   Social History Narrative   Not on file   Immunization History  Administered Date(s) Administered   Fluad Quad(high Dose 65+) 03/04/2020   Influenza, High Dose Seasonal PF 03/09/2014, 04/09/2015, 03/16/2016, 02/25/2017, 03/20/2018   PFIZER(Purple Top)SARS-COV-2 Vaccination 06/11/2019, 07/01/2019, 03/19/2020   Zoster Recombinat (Shingrix) 02/07/2018, 06/04/2018     Objective: Vital Signs: BP 128/65 (BP Location: Right Arm, Patient Position: Sitting, Cuff Size: Large)   Pulse 64   Resp 16   Ht 5' 2.5" (1.588 m)   Wt 182 lb 3.2 oz (82.6 kg)   BMI 32.79 kg/m    Physical Exam Vitals and nursing note reviewed.  Constitutional:      Appearance: She is well-developed.  HENT:     Head: Normocephalic and atraumatic.  Eyes:     Conjunctiva/sclera: Conjunctivae normal.  Cardiovascular:     Rate and Rhythm: Normal rate and regular rhythm.     Heart sounds: Normal heart sounds.  Pulmonary:     Effort: Pulmonary effort is normal.     Breath sounds: Normal breath sounds.  Abdominal:     General: Bowel sounds are normal.     Palpations: Abdomen is soft.  Musculoskeletal:     Cervical back: Normal range of motion.  Lymphadenopathy:     Cervical: No cervical adenopathy.  Skin:    General: Skin is warm and dry.     Capillary Refill: Capillary refill takes less than 2 seconds.  Neurological:     Mental Status: She is alert and oriented to person, place, and time.  Psychiatric:        Behavior: Behavior normal.      Musculoskeletal Exam: She had limited lateral rotation of the cervical spine.  She had thoracic kyphosis.  She had discomfort range of motion of the lumbar spine.  Shoulder joints, elbow joints, wrist joints with good range of motion.  No synovitis was noted over wrist joints or  MCP joints.  She had bilateral PIP and DIP thickening.  She had bilateral ring finger Dupuytren's contracture.  Hip joints and knee joints in good range of motion.  She had no tenderness over ankles or MTPs.  Need to  CDAI Exam: CDAI Score: 0.6  Patient Global: 3 mm; Provider Global: 3 mm Swollen: 0 ; Tender: 0  Joint Exam 01/19/2022   No joint exam has been documented for this visit   There is currently no information documented on the homunculus. Go to the Rheumatology activity and complete the homunculus joint exam.  Investigation: No additional findings.  Imaging: No results found.  Recent Labs: Lab Results  Component Value Date   WBC 15.4 (H) 09/20/2021   HGB  12.9 09/20/2021   PLT 304 09/20/2021   NA 140 09/20/2021   K 4.7 09/20/2021   CL 105 09/20/2021   CO2 28 09/20/2021   GLUCOSE 96 09/20/2021   BUN 19 09/20/2021   CREATININE 1.11 (H) 09/20/2021   BILITOT 0.5 09/20/2021   ALKPHOS 64 10/21/2020   AST 17 09/20/2021   ALT 13 09/20/2021   PROT 6.7 09/20/2021   ALBUMIN 3.5 10/21/2020   CALCIUM 9.4 09/20/2021   GFRAA 66 10/16/2020    Speciality Comments: No specialty comments available.  Procedures:  No procedures performed Allergies: Codeine, Glimepiride, Jardiance [empagliflozin], Metformin and related, and Penicillins   Assessment / Plan:     Visit Diagnoses: Rheumatoid arthritis involving multiple sites with positive rheumatoid factor (HCC) - RF 309, ANA negative, CRP 5.8, synovitis: She has been doing well on methotrexate 3 tablets p.o. weekly along with prednisone 3 mg p.o. daily.  She is unable to taper prednisone.  No synovitis was noted on the examination.  She has discomfort mostly due to underlying osteoarthritis.  High risk medication use - Methotrexate 2.5 mg 3 tablets by mouth every 7 days, folic acid 1 mg  daily, and prednisone 3 mg po daily.  -Labs obtained on September 20, 2021 were reviewed.  Her creatinine was elevated at 1.11.  WBC count was  elevated at 15.4 due to prednisone use.  I will obtain labs today.  Plan: CBC with Differential/Platelet, COMPLETE METABOLIC PANEL WITH GFR.  She was advised to get labs in November and every 3 months to monitor for drug toxicity.  Information regarding immunization was placed in the AVS.  She was advised to hold methotrexate if she develops an infection and resume after the infection resolves.  Polymyalgia rheumatica (HCC)-she had no muscular weakness or tenderness.  She has some difficulty getting out of the chair due to lower back discomfort.  She continues to be on low-dose prednisone.  Primary osteoarthritis of both hands-she is due osteoarthritis involving bilateral hands.  Joint protection muscle strengthening was discussed.  Primary osteoarthritis of both knees-no warmth swelling or effusion was noted.  She had good mobility in her bilateral knee joints.  She continues to have some discomfort.  Primary osteoarthritis of both feet-she had bilateral PIP and DIP thickening with overcrowding of the toes.  Proper fitting shoes were advised.  History of humerus fracture - Followed by Dr. Rhona Raider.  DDD (degenerative disc disease), lumbar - With a spinal stenosis.  She has chronic pain and discomfort.  She is followed by Dr. Mina Marble.  She had had an injection yesterday and had relief in her lumbar spine symptoms.  Osteopenia of multiple sites - DEXA 05/15/18: The BMD measured at Femur Neck Left is 0.820 g/cm2 with a T-score of -1.6.  I did detailed discussion with the patient regarding repeating DEXA scan but she declined.  She states she would does not want to take any additional medications and would prefer not to have the test.  Increased risk of osteoporosis with chronic use of prednisone was discussed.  Increased risk of fracture was also discussed.  Patient voiced understanding.  History of hypertension-blood pressure was normal today.  Status post aortic valve replacement - October 2021.   Patient is on chronic anticoagulation.  History of hyperlipidemia-she is on atorvastatin.  Chronic anticoagulation - on Plavix and Eliquis  History of diabetes mellitus  History of gastroesophageal reflux (GERD)  Orders: Orders Placed This Encounter  Procedures   CBC with Differential/Platelet   COMPLETE METABOLIC  PANEL WITH GFR   No orders of the defined types were placed in this encounter.   Follow-Up Instructions: Return in about 5 months (around 06/21/2022) for Rheumatoid arthritis.   Bo Merino, MD  Note - This record has been created using Editor, commissioning.  Chart creation errors have been sought, but may not always  have been located. Such creation errors do not reflect on  the standard of medical care.

## 2022-01-18 DIAGNOSIS — M48062 Spinal stenosis, lumbar region with neurogenic claudication: Secondary | ICD-10-CM | POA: Diagnosis not present

## 2022-01-19 ENCOUNTER — Encounter: Payer: Self-pay | Admitting: Rheumatology

## 2022-01-19 ENCOUNTER — Ambulatory Visit: Payer: Medicare Other | Attending: Rheumatology | Admitting: Rheumatology

## 2022-01-19 VITALS — BP 128/65 | HR 64 | Resp 16 | Ht 62.5 in | Wt 182.2 lb

## 2022-01-19 DIAGNOSIS — Z8639 Personal history of other endocrine, nutritional and metabolic disease: Secondary | ICD-10-CM | POA: Diagnosis not present

## 2022-01-19 DIAGNOSIS — M353 Polymyalgia rheumatica: Secondary | ICD-10-CM

## 2022-01-19 DIAGNOSIS — Z7901 Long term (current) use of anticoagulants: Secondary | ICD-10-CM | POA: Diagnosis not present

## 2022-01-19 DIAGNOSIS — M19042 Primary osteoarthritis, left hand: Secondary | ICD-10-CM | POA: Diagnosis not present

## 2022-01-19 DIAGNOSIS — Z8781 Personal history of (healed) traumatic fracture: Secondary | ICD-10-CM | POA: Diagnosis not present

## 2022-01-19 DIAGNOSIS — M19041 Primary osteoarthritis, right hand: Secondary | ICD-10-CM | POA: Diagnosis not present

## 2022-01-19 DIAGNOSIS — M19071 Primary osteoarthritis, right ankle and foot: Secondary | ICD-10-CM

## 2022-01-19 DIAGNOSIS — M51369 Other intervertebral disc degeneration, lumbar region without mention of lumbar back pain or lower extremity pain: Secondary | ICD-10-CM

## 2022-01-19 DIAGNOSIS — M5136 Other intervertebral disc degeneration, lumbar region: Secondary | ICD-10-CM | POA: Insufficient documentation

## 2022-01-19 DIAGNOSIS — Z952 Presence of prosthetic heart valve: Secondary | ICD-10-CM | POA: Diagnosis not present

## 2022-01-19 DIAGNOSIS — M8589 Other specified disorders of bone density and structure, multiple sites: Secondary | ICD-10-CM | POA: Diagnosis not present

## 2022-01-19 DIAGNOSIS — Z8679 Personal history of other diseases of the circulatory system: Secondary | ICD-10-CM | POA: Diagnosis not present

## 2022-01-19 DIAGNOSIS — Z8719 Personal history of other diseases of the digestive system: Secondary | ICD-10-CM

## 2022-01-19 DIAGNOSIS — M170 Bilateral primary osteoarthritis of knee: Secondary | ICD-10-CM | POA: Diagnosis not present

## 2022-01-19 DIAGNOSIS — Z79899 Other long term (current) drug therapy: Secondary | ICD-10-CM | POA: Diagnosis not present

## 2022-01-19 DIAGNOSIS — M0579 Rheumatoid arthritis with rheumatoid factor of multiple sites without organ or systems involvement: Secondary | ICD-10-CM | POA: Diagnosis not present

## 2022-01-19 DIAGNOSIS — M19072 Primary osteoarthritis, left ankle and foot: Secondary | ICD-10-CM | POA: Diagnosis not present

## 2022-01-19 NOTE — Patient Instructions (Signed)
Standing Labs We placed an order today for your standing lab work.   Please have your standing labs drawn in November and every 3 months  If possible, please have your labs drawn 2 weeks prior to your appointment so that the provider can discuss your results at your appointment.  Please note that you may see your imaging and lab results in [facility] before we have reviewed them. We may be awaiting multiple results to interpret others before contacting you. Please allow our office up to 72 hours to thoroughly review all of the results before contacting the office for clarification of your results.  We currently have open lab daily: Monday through Thursday from 1:30 PM-4:30 PM and Friday from 1:30 PM- 4:00 PM If possible, please come for your lab work on Monday, Thursday or Friday afternoons, as you may experience shorter wait times.   Effective March 30, 2022 the new lab hours will change to: Monday through Thursday from 1:30 PM-5:00 PM and Friday from 8:30 AM-12:00 PM If possible, please come for your lab work on Monday and Thursday afternoons, as you may experience shorter wait times.  Please be advised, all patients with office appointments requiring lab work will take precedent over walk-in lab work.    The office is located at [address] [place], [place], [place] [address] No appointment is necessary.   Labs are drawn by [facility]. Please bring your co-pay at the time of your lab draw.  You may receive a bill from [facility] for your lab work.  Please note if you are on Hydroxychloroquine and and an order has been placed for a Hydroxychloroquine level, you will need to have it drawn 4 hours or more after your last dose.  If you wish to have your labs drawn at another location, please call the office 24 hours in advance to send orders.  If you have any questions regarding directions or hours of operation,  please call [phone].   As a reminder, please drink plenty of water prior  to coming for your lab work. Thanks!   Vaccines You are taking a medication(s) that can suppress your immune system.  The following immunizations are recommended: Flu annually Covid-19  Td/Tdap (tetanus, diphtheria, pertussis) every 10 years Pneumonia (Prevnar 15 then Pneumovax 23 at least 1 year apart.  Alternatively, can take Prevnar 20 without needing additional dose) Shingrix: 2 doses from 4 weeks to 6 months apart  Please check with your PCP to make sure you are up to date.   If you have signs or symptoms of an infection or start antibiotics: First, call your PCP for workup of your infection. Hold your medication through the infection, until you complete your antibiotics, and until symptoms resolve if you take the following: Injectable medication (Actemra, Benlysta, Cimzia, Cosentyx, Enbrel, Humira, Kevzara, Orencia, Remicade, Simponi, Stelara, Taltz, Tremfya) Methotrexate Leflunomide (Arava) Mycophenolate (Cellcept) Morrie Sheldon, Olumiant, or Rinvoq

## 2022-01-20 LAB — CBC WITH DIFFERENTIAL/PLATELET
Absolute Monocytes: 660 cells/uL (ref 200–950)
Basophils Absolute: 33 cells/uL (ref 0–200)
Basophils Relative: 0.2 %
Eosinophils Absolute: 0 cells/uL — ABNORMAL LOW (ref 15–500)
Eosinophils Relative: 0 %
HCT: 38.4 % (ref 35.0–45.0)
Hemoglobin: 12.9 g/dL (ref 11.7–15.5)
Lymphs Abs: 1155 cells/uL (ref 850–3900)
MCH: 31.2 pg (ref 27.0–33.0)
MCHC: 33.6 g/dL (ref 32.0–36.0)
MCV: 93 fL (ref 80.0–100.0)
MPV: 10.1 fL (ref 7.5–12.5)
Monocytes Relative: 4 %
Neutro Abs: 14652 cells/uL — ABNORMAL HIGH (ref 1500–7800)
Neutrophils Relative %: 88.8 %
Platelets: 349 10*3/uL (ref 140–400)
RBC: 4.13 10*6/uL (ref 3.80–5.10)
RDW: 13 % (ref 11.0–15.0)
Total Lymphocyte: 7 %
WBC: 16.5 10*3/uL — ABNORMAL HIGH (ref 3.8–10.8)

## 2022-01-20 LAB — COMPLETE METABOLIC PANEL WITH GFR
AG Ratio: 1.4 (calc) (ref 1.0–2.5)
ALT: 12 U/L (ref 6–29)
AST: 13 U/L (ref 10–35)
Albumin: 4.2 g/dL (ref 3.6–5.1)
Alkaline phosphatase (APISO): 64 U/L (ref 37–153)
BUN/Creatinine Ratio: 23 (calc) — ABNORMAL HIGH (ref 6–22)
BUN: 25 mg/dL (ref 7–25)
CO2: 22 mmol/L (ref 20–32)
Calcium: 9.7 mg/dL (ref 8.6–10.4)
Chloride: 104 mmol/L (ref 98–110)
Creat: 1.08 mg/dL — ABNORMAL HIGH (ref 0.60–0.95)
Globulin: 3 g/dL (calc) (ref 1.9–3.7)
Glucose, Bld: 209 mg/dL — ABNORMAL HIGH (ref 65–99)
Potassium: 4.8 mmol/L (ref 3.5–5.3)
Sodium: 139 mmol/L (ref 135–146)
Total Bilirubin: 0.5 mg/dL (ref 0.2–1.2)
Total Protein: 7.2 g/dL (ref 6.1–8.1)
eGFR: 50 mL/min/{1.73_m2} — ABNORMAL LOW (ref >=60)

## 2022-01-20 NOTE — Progress Notes (Signed)
Elevated WBCs due to chronic prednisone use.  Creatinine is mildly elevated and is stable.  Glucose is elevated.  Please forward results to her PCP.

## 2022-01-25 DIAGNOSIS — N390 Urinary tract infection, site not specified: Secondary | ICD-10-CM | POA: Diagnosis not present

## 2022-02-03 NOTE — Progress Notes (Signed)
Office Visit    Patient Name: Sophia Ward Date of Encounter: 02/04/2022  Primary Care Provider:  [name], MD Primary Cardiologist:  Sophia Grooms, MD Primary Electrophysiologist: None  Chief Complaint    Sophia Ward is a 88 y.o. female with PMH of CAD s/p acute MI DES x1 to LAD 2004, PAF previously on Eliquis, DM type II, HTN, HLD, aortic stenosis s/p TAVR 2021 who presents today for follow-up of CAD.  Past Medical History    Past Medical History:  Diagnosis Date   Allergic rhinitis    Anemia 09/2019   Arthritis    hands   BPPV (benign paroxysmal positional vertigo)    Coronary artery disease    Diabetes mellitus without complication ([place])    Essential hypertension, benign 01/30/2014   GERD (gastroesophageal reflux disease)    Hypertension    Lesion of pancreas    Mixed hyperlipidemia 01/30/2014   Myocardial infarction ([place]) 2004   mild MI-no damage- had stents   Osteopenia    Post-menopausal    Pulmonary nodules    Rheumatoid arthritis (HCC)    S/P TAVR (transcatheter aortic valve replacement) 03/03/2020   s/p TAVR with a 23 mm Edwards S3U via the TF approach by Drs Burt Knack and Roxy Manns   Severe aortic stenosis    Spinal stenosis    Stenosing tenosynovitis    Past Surgical History:  Procedure Laterality Date   ABDOMINAL HYSTERECTOMY     APPENDECTOMY     BIOPSY  10/25/2019   Procedure: BIOPSY;  Surgeon: Otis Brace, MD;  Location: [place];  Service: Gastroenterology;;   BIOPSY  10/26/2019   Procedure: BIOPSY;  Surgeon: Otis Brace, MD;  Location: [place];  Service: Gastroenterology;;   CARDIAC CATHETERIZATION  2004   carpel tunnel     COLONOSCOPY WITH PROPOFOL N/A 10/30/2012   Procedure: COLONOSCOPY WITH PROPOFOL;  Surgeon: Garlan Fair, MD;  Location: [facility];  Service: Endoscopy;  Laterality: N/A;   COLONOSCOPY WITH PROPOFOL N/A 10/26/2019   Procedure: COLONOSCOPY WITH PROPOFOL;  Surgeon: Otis Brace, MD;   Location: [place];  Service: Gastroenterology;  Laterality: N/A;   CORONARY ANGIOPLASTY  2004   CORONARY ATHERECTOMY N/A 02/21/2020   Procedure: CORONARY ATHERECTOMY;  Surgeon: Jettie Booze, MD;  Location: [place] [facility];  Service: Cardiovascular;  Laterality: N/A;   CORONARY STENT INTERVENTION N/A 02/21/2020   Procedure: CORONARY STENT INTERVENTION;  Surgeon: Jettie Booze, MD;  Location: [place] [facility];  Service: Cardiovascular;  Laterality: N/A;   DILATION AND CURETTAGE OF UTERUS     ESOPHAGOGASTRODUODENOSCOPY N/A 10/25/2019   Procedure: ESOPHAGOGASTRODUODENOSCOPY (EGD);  Surgeon: Otis Brace, MD;  Location: [facility] ENDOSCOPY;  Service: Gastroenterology;  Laterality: N/A;   ESOPHAGOGASTRODUODENOSCOPY (EGD) WITH PROPOFOL N/A 10/30/2012   Procedure: ESOPHAGOGASTRODUODENOSCOPY (EGD) WITH PROPOFOL;  Surgeon: Garlan Fair, MD;  Location: [facility];  Service: Endoscopy;  Laterality: N/A;   EYE SURGERY     bilateral cataract with lens implant   EYE SURGERY     INJECTION OF SILICONE OIL Left 0/17/4944   Procedure: Injection Of Silicone Oil;  Surgeon: Jalene Mullet, MD;  Location: [place];  Service: Ophthalmology;  Laterality: Left;   INTRAVASCULAR PRESSURE WIRE/FFR STUDY N/A 01/29/2020   Procedure: INTRAVASCULAR PRESSURE WIRE/FFR STUDY;  Surgeon: Jettie Booze, MD;  Location: [place] [facility];  Service: Cardiovascular;  Laterality: N/A;   INTRAVASCULAR ULTRASOUND/IVUS N/A 02/21/2020   Procedure: Intravascular Ultrasound/IVUS;  Surgeon: Jettie Booze, MD;  Location: [facility]  INVASIVE CV LAB;  Service: Cardiovascular;  Laterality: N/A;   left shouler surgery     PHOTOCOAGULATION WITH LASER Left 02/12/2019   Procedure: Photocoagulation With Laser;  Surgeon: Jalene Mullet, MD;  Location: [place];  Service: Ophthalmology;  Laterality: Left;   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 02/12/2019   Procedure: REPAIR OF COMPLEX TRACTION RETINAL DETACHMENT;   Surgeon: Jalene Mullet, MD;  Location: [place];  Service: Ophthalmology;  Laterality: Left;   RIGHT/LEFT HEART CATH AND CORONARY ANGIOGRAPHY N/A 01/29/2020   Procedure: RIGHT/LEFT HEART CATH AND CORONARY ANGIOGRAPHY;  Surgeon: Jettie Booze, MD;  Location: [name] [facility];  Service: Cardiovascular;  Laterality: N/A;   TEE WITHOUT CARDIOVERSION N/A 03/03/2020   Procedure: TRANSESOPHAGEAL ECHOCARDIOGRAM (TEE);  Surgeon: Sherren Mocha, MD;  Location: [place];  Service: Open Heart Surgery;  Laterality: N/A;   TONSILLECTOMY     TRANSCATHETER AORTIC VALVE REPLACEMENT, TRANSFEMORAL  03/03/2020   TRANSCATHETER AORTIC VALVE REPLACEMENT, TRANSFEMORAL N/A 03/03/2020   Procedure: TRANSCATHETER AORTIC VALVE REPLACEMENT, TRANSFEMORAL USING EDWARDS SAPIEN 3 23 MM AORTIC VALVE.;  Surgeon: Sherren Mocha, MD;  Location: [place];  Service: Open Heart Surgery;  Laterality: N/A;   VITRECTOMY 25 GAUGE WITH SCLERAL BUCKLE Left 02/12/2019   Procedure: Vitrectomy 25 Gauge With Scleral Buckle;  Surgeon: Jalene Mullet, MD;  Location: [place];  Service: Ophthalmology;  Laterality: Left;    Allergies  Allergies  Allergen Reactions   Codeine Nausea And Vomiting    MAKES HER SICK   Glimepiride Other (See Comments)    Can't remember   Jardiance [Empagliflozin] Other (See Comments)    Yeast   Metformin And Related Diarrhea   Penicillins Rash    History of Present Illness    Sophia Ward is a 88 year old female with the above-mentioned past medical history who presents today for follow-up of coronary artery disease.  [name] has cardiac history that dates back to 2004 when she suffered STEMI and was treated with DES x1 to LAD with region stenosis and subsequent PCI 5 months later.  She developed PAF during hospitalization in 2021 and was placed on Eliquis for CHA2DS2-VASc score of 6.  She spontaneously converted back to sinus rhythm during her hospitalization.    She maintained sinus rhythm for the most  part until being readmitted 12/2019 for UTI and acute hypoxic respiratory failure.  She underwent echo/RHC on 01/2020 by Dr. Irish Lack that revealed severe two-vessel CAD and nonobstructive disease in left circumflex with transvalvular gradient and aortic valve of 16.8 mmHg.  She also was noted of having elevated pulmonary artery pressures and had staged PCI of ostial RCA with orbital arthrectomy and PCI of left main/LAD.  She underwent successful TAVR on 02/2020.  She was placed on monotherapy with Plavix with no bleeding issues or anemia.  2D echo was completed 03/2020 that showed EF 60%, G2DD, normally functioning TAVR with a mean gradient of 14 mmHg and no PVL, mild posterior pericardial effusion.  She was last seen by Dr. Irish Lack on 04/2021.  Her Eliquis was stopped due to bleeding concerns and lack of AF symptoms.  She had no anginal symptoms during visit.    Ms. Delaney presents today for follow-up alone.  Since last being seen in the office patient reports she has been doing well with no cardiac complaints.  She is compliant with her current medications and denies any side effects.  She is euvolemic on examination with exception of trace edema and lower extremities.  She is  tolerating blood thinners without any bleeding concerns.  She is watching her sodium but does admit to some indiscretions since her husband passed away.  Patient denies chest pain, palpitations, dyspnea, PND, orthopnea, nausea, vomiting, dizziness, syncope, edema, weight gain, or early satiety.  Home Medications    Current Outpatient Medications  Medication Sig Dispense Refill   amLODipine (NORVASC) 10 MG tablet TAKE 1 TABLET BY MOUTH ONCE DAILY IN THE MORNING 90 tablet 2   apixaban (ELIQUIS) 5 MG TABS tablet Take 1 tablet by mouth twice daily 60 tablet 5   atorvastatin (LIPITOR) 40 MG tablet Take 1 tablet (40 mg total) by mouth at bedtime. 90 tablet 3   B-D ULTRAFINE III SHORT PEN 31G X 8 MM MISC 1 each by Other route in the  morning, at noon, in the evening, and at bedtime.      Calcium Carbonate-Vitamin D (CALCIUM + D PO) Take 1 tablet by mouth daily.      Cholecalciferol (VITAMIN D3) 125 MCG (5000 UT) CAPS Take 5,000 Units by mouth daily.      clopidogrel (PLAVIX) 75 MG tablet Take 4 tablets (300 mg) by mouth this evening, then take 1 tablet (75 mg) by mouth daily (Patient taking differently: Take 75 mg by mouth daily. Take 4 tablets (300 mg) by mouth this evening, then take 1 tablet (75 mg) by mouth daily) 90 tablet 0   desonide (DESOWEN) 0.05 % cream Apply 1 application  topically 2 (two) times daily as needed.     escitalopram (LEXAPRO) 5 MG tablet Take 10 mg by mouth daily.     estradiol (ESTRACE) 0.1 MG/GM vaginal cream SMARTSIG:0.5 Gram(s) Vaginal     ferrous sulfate 325 (65 FE) MG tablet Take 1 tablet (325 mg total) by mouth 2 (two) times daily with a meal. 60 tablet 1   folic acid (FOLVITE) 1 MG tablet Take 1 tablet (1 mg total) by mouth daily. 180 tablet 1   furosemide (LASIX) 20 MG tablet Take 1 tablet (20 mg total) by mouth daily as needed. 30 tablet 1   gabapentin (NEURONTIN) 100 MG capsule TAKE 1 TO 2 CAPSULES BY MOUTH ONCE DAILY AT BEDTIME AS NEEDED     insulin lispro (HUMALOG) 100 UNIT/ML KiwkPen Inject 6 Units into the skin 3 (three) times daily.      LEVEMIR FLEXTOUCH 100 UNIT/ML Pen Inject 14 Units into the skin every morning.   0   methotrexate 2.5 MG tablet TAKE 3 TABLETS BY MOUTH ONCE A WEEK 36 tablet 0   metoprolol succinate (TOPROL-XL) 100 MG 24 hr tablet TAKE 1 TABLET BY MOUTH IN THE MORNING 90 tablet 2   nitroGLYCERIN (NITROSTAT) 0.4 MG SL tablet DISSOLVE ONE TABLET UNDER THE TONGUE EVERY 5 MINUTES AS NEEDED FOR CHEST PAIN.  DO NOT EXCEED A TOTAL OF 3 DOSES IN 15 MINUTES 25 tablet 5   Omega-3 Fatty Acids (FISH OIL PO) Take 1 capsule by mouth daily.      ondansetron (ZOFRAN ODT) 4 MG disintegrating tablet Take 1 tablet (4 mg total) by mouth every 8 (eight) hours as needed for nausea or  vomiting. 20 tablet 0   pantoprazole (PROTONIX) 40 MG tablet Take 1 tablet (40 mg total) by mouth daily after lunch. 30 tablet 1   predniSONE (DELTASONE) 1 MG tablet Take 3 tablets (3 mg total) by mouth daily. 270 tablet 0   ramipril (ALTACE) 10 MG tablet Take 10 mg by mouth 2 (two) times daily.  No current facility-administered medications for this visit.     Review of Systems  Please see the history of present illness.    (+) Trace edema in the (+) Shortness of breath with heavy exertion  All other systems reviewed and are otherwise negative except as noted above.  Physical Exam    Wt Readings from Last 3 Encounters:  02/04/22 183 lb (83 kg)  01/19/22 182 lb 3.2 oz (82.6 kg)  08/13/21 180 lb 6.4 oz (81.8 kg)   VS: Vitals:   02/04/22 1414  Pulse: 65  SpO2: 95%  ,Body mass index is 32.94 kg/m.  Constitutional:      Appearance: Healthy appearance. Not in distress.  Neck:     Vascular: JVD normal.  Pulmonary:     Effort: Pulmonary effort is normal.     Breath sounds: No wheezing. No rales. Diminished in the bases Cardiovascular:     Normal rate. Regular rhythm. Normal S1. Normal S2.      Murmurs: There is a soft murmur at right upper sternal border.  Edema:    Peripheral edema absent.  Abdominal:     Palpations: Abdomen is soft non tender. There is no hepatomegaly.  Skin:    General: Skin is warm and dry.  Neurological:     General: No focal deficit present.     Mental Status: Alert and oriented to person, place and time.     Cranial Nerves: Cranial nerves are intact.  EKG/LABS/Other Studies Reviewed    ECG personally reviewed by me today -sinus rhythm with rate of 65 bpm and no acute changes  Risk Assessment/Calculations:    CHA2DS2-VASc Score = 7   This indicates a 11.2% annual risk of stroke. The patient's score is based upon: CHF History: 1 HTN History: 1 Diabetes History: 1 Stroke History: 0 Vascular Disease History: 1 Age Score: 2 Gender Score:  1     Lab Results  Component Value Date   WBC 16.5 (H) 01/19/2022   HGB 12.9 01/19/2022   HCT 38.4 01/19/2022   MCV 93.0 01/19/2022   PLT 349 01/19/2022   Lab Results  Component Value Date   CREATININE 1.08 (H) 01/19/2022   BUN 25 01/19/2022   NA 139 01/19/2022   K 4.8 01/19/2022   CL 104 01/19/2022   CO2 22 01/19/2022   Lab Results  Component Value Date   ALT 12 01/19/2022   AST 13 01/19/2022   ALKPHOS 64 10/21/2020   BILITOT 0.5 01/19/2022   Lab Results  Component Value Date   CHOL 121 02/25/2020   HDL 49 02/25/2020   LDLCALC 57 02/25/2020   TRIG 76 02/25/2020   CHOLHDL 2.5 02/25/2020    Lab Results  Component Value Date   HGBA1C 5.3 02/28/2020    Assessment & Plan    1.  Severe AS s/p TAVR: -TAVR repair 2021 with 2D echo showingEF 60%, G2DD, normally functioning TAVR with a mean gradient of 14 mmHg  -Patient reports no shortness of breath or lower extremity swelling. -SBE prophylaxis discussed during visit -Blood pressure well controlled and volume status euvolemic -We will repeat 2D echo to evaluate valve function   2.  PAF: -Patient in sinus rhythm today and remains rate controlled with Toprol-XL 100 mg -She reports no inappropriate bleeding and creatinine was 1.08 and hemoglobin of 12.9 -Continue Eliquis 5 mg twice daily CHA2DS2-VASc Score = 7 [CHF History: 1, HTN History: 1, Diabetes History: 1, Stroke History: 0, Vascular Disease History: 1, Age Score:  2, Gender Score: 1].  Therefore, the patient's annual risk of stroke is 11.2 %.      3.  CAD: -CAD s/p acute MI DES x1 to LAD 2004 -She reports no chest pain or anginal equivalent today -Continue GDMT with Toprol-XL 100 mg, Lipitor 40 mg, and Plavix 75 mg  4.  HTN: -Patient's blood pressure today was well controlled today at 120/62 -Continue Altace 10 mg, Toprol 100 mg, Norvasc 10 mg  5.  Chronic diastolic CHF: This is -Patient's last 2D echo was completed with EF 60%, normally functioning TAVR  with a mean gradient of 19 mmHg and no PVL. Mild MR.  -Patient was euvolemic on examination with exception of trace lower extremity edema -We will start Lasix 20 mg as needed Ward sodium diet, fluid restriction <2L, and daily weights encouraged. Educated to contact our office for weight gain of 2 lbs overnight or 5 lbs in one week.        Disposition: Follow-up with Sophia Grooms, MD or APP in 12 months    Medication Adjustments/Labs and Tests Ordered: Current medicines are reviewed at length with the patient today.  Concerns regarding medicines are outlined above.   Signed, Mable Fill, Marissa Nestle, NP 02/04/2022, 4:31 PM [place]

## 2022-02-04 ENCOUNTER — Encounter: Payer: Self-pay | Admitting: Nurse Practitioner

## 2022-02-04 ENCOUNTER — Ambulatory Visit: Payer: Medicare Other | Attending: Nurse Practitioner | Admitting: Nurse Practitioner

## 2022-02-04 VITALS — BP 120/62 | HR 65 | Ht 62.5 in | Wt 183.0 lb

## 2022-02-04 DIAGNOSIS — I10 Essential (primary) hypertension: Secondary | ICD-10-CM | POA: Diagnosis not present

## 2022-02-04 DIAGNOSIS — I480 Paroxysmal atrial fibrillation: Secondary | ICD-10-CM | POA: Diagnosis not present

## 2022-02-04 DIAGNOSIS — I5032 Chronic diastolic (congestive) heart failure: Secondary | ICD-10-CM | POA: Diagnosis not present

## 2022-02-04 DIAGNOSIS — Z952 Presence of prosthetic heart valve: Secondary | ICD-10-CM | POA: Diagnosis not present

## 2022-02-04 DIAGNOSIS — I2510 Atherosclerotic heart disease of native coronary artery without angina pectoris: Secondary | ICD-10-CM | POA: Insufficient documentation

## 2022-02-04 MED ORDER — FUROSEMIDE 20 MG PO TABS: 20.0000 mg | ORAL_TABLET | Freq: Every day | ORAL | 1 refills | Status: DC | PRN

## 2022-02-04 NOTE — Patient Instructions (Signed)
Medication Instructions:  Your physician has recommended you make the following change in your medication:   START Lasix 20 mg taking ONLY AS NEEDED   *If you need a refill on your cardiac medications before your next appointment, please call your pharmacy*   Lab Work: None ordered  If you have labs (blood work) drawn today and your tests are completely normal, you will receive your results only by: [place] (if you have MyChart) OR A paper copy in the mail If you have any lab test that is abnormal or we need to change your treatment, we will call you to review the results.   Testing/Procedures: Your physician has requested that you have an echocardiogram. Echocardiography is a painless test that uses sound waves to create images of your heart. It provides your doctor with information about the size and shape of your heart and how well your heart's chambers and valves are working. This procedure takes approximately one hour. There are no restrictions for this procedure.    Follow-Up: At [facility], you and your health needs are our priority.  As part of our continuing mission to provide you with exceptional heart care, we have created designated Provider Care Teams.  These Care Teams include your primary Cardiologist (physician) and Advanced Practice Providers (APPs -  Physician Assistants and Nurse Practitioners) who all work together to provide you with the care you need, when you need it.  We recommend signing up for the patient portal called "MyChart".  Sign up information is provided on this After Visit Summary.  MyChart is used to connect with patients for Virtual Visits (Telemedicine).  Patients are able to view lab/test results, encounter notes, upcoming appointments, etc.  Non-urgent messages can be sent to your provider as well.   To learn more about what you can do with MyChart, go to [email or web address].    Your next appointment:   1 year(s)  The format  for your next appointment:   In Person  Provider:   Ambrose Pancoast, NP         Other Instructions   Important Information About Sugar

## 2022-02-14 DIAGNOSIS — Z85828 Personal history of other malignant neoplasm of skin: Secondary | ICD-10-CM | POA: Diagnosis not present

## 2022-02-14 DIAGNOSIS — C44629 Squamous cell carcinoma of skin of left upper limb, including shoulder: Secondary | ICD-10-CM | POA: Diagnosis not present

## 2022-02-14 DIAGNOSIS — D485 Neoplasm of uncertain behavior of skin: Secondary | ICD-10-CM | POA: Diagnosis not present

## 2022-02-14 DIAGNOSIS — D0462 Carcinoma in situ of skin of left upper limb, including shoulder: Secondary | ICD-10-CM | POA: Diagnosis not present

## 2022-02-14 DIAGNOSIS — L905 Scar conditions and fibrosis of skin: Secondary | ICD-10-CM | POA: Diagnosis not present

## 2022-02-21 ENCOUNTER — Ambulatory Visit (HOSPITAL_COMMUNITY): Payer: Medicare Other | Attending: Nurse Practitioner

## 2022-02-21 DIAGNOSIS — I2510 Atherosclerotic heart disease of native coronary artery without angina pectoris: Secondary | ICD-10-CM | POA: Diagnosis not present

## 2022-02-21 DIAGNOSIS — Z952 Presence of prosthetic heart valve: Secondary | ICD-10-CM

## 2022-02-21 DIAGNOSIS — I5032 Chronic diastolic (congestive) heart failure: Secondary | ICD-10-CM | POA: Diagnosis not present

## 2022-02-21 DIAGNOSIS — I480 Paroxysmal atrial fibrillation: Secondary | ICD-10-CM | POA: Diagnosis not present

## 2022-02-21 DIAGNOSIS — I10 Essential (primary) hypertension: Secondary | ICD-10-CM

## 2022-02-21 LAB — ECHOCARDIOGRAM COMPLETE
AR max vel: 0.53 cm2
AV Area VTI: 0.54 cm2
AV Area mean vel: 0.51 cm2
AV Mean grad: 28 mmHg
AV Peak grad: 49.8 mmHg
Ao pk vel: 3.53 m/s
Area-P 1/2: 3.02 cm2
S' Lateral: 2.5 cm

## 2022-02-24 ENCOUNTER — Telehealth: Payer: Self-pay

## 2022-02-24 DIAGNOSIS — Z85828 Personal history of other malignant neoplasm of skin: Secondary | ICD-10-CM | POA: Diagnosis not present

## 2022-02-24 DIAGNOSIS — C44629 Squamous cell carcinoma of skin of left upper limb, including shoulder: Secondary | ICD-10-CM | POA: Diagnosis not present

## 2022-02-24 NOTE — Telephone Encounter (Signed)
Echo report shows hyperdynamic heart squeezing function with severe asymmetrical thickening of the inferior segment.  There is significant stiffness in the heart that is consistent with previous echocardiogram.  The aortic valve gradient has increased since previous echocardiogram.      Dr. Irish Lack should we order a cardiac CT to assess aortic structures further?  Please continue current medications as prescribed.     Ambrose Pancoast, NP    Per the note above, Pt called and educated on the Echocardiogram results and the meaning behind them.  Pt was made aware Dr. Irish Lack was consulted regarding a possible cardiac CT.   Pt advised to continue taking her current medications, and follow the providers plan of care.  Pt told that we will reach out to her in the near future, when a new order is received.    Pt understood all education and appreciated knowing the results.

## 2022-02-24 NOTE — Telephone Encounter (Signed)
-----   Message from Marylu Lund., NP sent at 02/23/2022  8:07 PM EDT ----- Echo report shows hyperdynamic heart squeezing function with severe asymmetrical thickening of the inferior segment.  There is significant stiffness in the heart that is consistent with previous echocardiogram.  The aortic valve gradient has increased since previous echocardiogram.     Dr. Irish Lack should we order a cardiac CT to assess aortic structures further?  Please continue current medications as prescribed.    Ambrose Pancoast, NP

## 2022-02-28 ENCOUNTER — Other Ambulatory Visit: Payer: Self-pay | Admitting: Physician Assistant

## 2022-02-28 DIAGNOSIS — Z952 Presence of prosthetic heart valve: Secondary | ICD-10-CM

## 2022-02-28 DIAGNOSIS — T8209XA Other mechanical complication of heart valve prosthesis, initial encounter: Secondary | ICD-10-CM

## 2022-03-01 ENCOUNTER — Telehealth: Payer: Self-pay | Admitting: *Deleted

## 2022-03-01 ENCOUNTER — Encounter: Payer: Self-pay | Admitting: *Deleted

## 2022-03-01 DIAGNOSIS — Z01812 Encounter for preprocedural laboratory examination: Secondary | ICD-10-CM

## 2022-03-01 DIAGNOSIS — Z952 Presence of prosthetic heart valve: Secondary | ICD-10-CM

## 2022-03-01 NOTE — Telephone Encounter (Signed)
Reviewed with K. [place] Silos, [place] and [place] for patient to take morning dose of Toprol 2 hours prior to scan. I spoke with patient and told her about CT Scan.  Patient will stop by office tomorrow for BMET.  I verbally went over CT instructions with patient and will leave printed copy at front desk for her to pick up when here for labs.

## 2022-03-01 NOTE — Telephone Encounter (Signed)
-----   Message from Eileen Stanford, PA-C sent at 02/28/2022 10:27 AM EDT ----- Regarding: pre cert for cardiac CT This patient needs pre cert for cardiac CT. This is scheduled for 10/11 @ 10:30am for Dr. Audie Box to read. She has had progressively elevated gradients of her TAVR valve. This is to rule out leaflet thrombosis ( of note pt is on eliquis).   Fraser Din, can you please call the patient and let her know about the apt and send her cardiac CT instructions.    Thank you!  Katie

## 2022-03-02 ENCOUNTER — Ambulatory Visit: Payer: Medicare Other | Attending: Physician Assistant

## 2022-03-02 DIAGNOSIS — Z01812 Encounter for preprocedural laboratory examination: Secondary | ICD-10-CM | POA: Diagnosis not present

## 2022-03-02 DIAGNOSIS — Z952 Presence of prosthetic heart valve: Secondary | ICD-10-CM

## 2022-03-02 LAB — BASIC METABOLIC PANEL
BUN/Creatinine Ratio: 16 (ref 12–28)
BUN: 15 mg/dL (ref 8–27)
CO2: 23 mmol/L (ref 20–29)
Calcium: 9.4 mg/dL (ref 8.7–10.3)
Chloride: 103 mmol/L (ref 96–106)
Creatinine, Ser: 0.94 mg/dL (ref 0.57–1.00)
Glucose: 197 mg/dL — ABNORMAL HIGH (ref 70–99)
Potassium: 4.4 mmol/L (ref 3.5–5.2)
Sodium: 140 mmol/L (ref 134–144)
eGFR: 59 mL/min/{1.73_m2} — ABNORMAL LOW (ref >59)

## 2022-03-09 ENCOUNTER — Ambulatory Visit (HOSPITAL_COMMUNITY)
Admission: RE | Admit: 2022-03-09 | Discharge: 2022-03-09 | Disposition: A | Discharge status: Home / Self Care | Payer: Medicare Other | Source: Ambulatory Visit | Attending: Physician Assistant | Admitting: Physician Assistant

## 2022-03-09 DIAGNOSIS — Z952 Presence of prosthetic heart valve: Secondary | ICD-10-CM | POA: Insufficient documentation

## 2022-03-09 DIAGNOSIS — T8209XA Other mechanical complication of heart valve prosthesis, initial encounter: Secondary | ICD-10-CM | POA: Insufficient documentation

## 2022-03-09 MED ORDER — IOHEXOL 350 MG/ML SOLN
100.0000 mL | Freq: Once | INTRAVENOUS | Status: AC | PRN
  Administered 2022-03-09: 100 mL via INTRAVENOUS

## 2022-03-17 DIAGNOSIS — Z23 Encounter for immunization: Secondary | ICD-10-CM | POA: Diagnosis not present

## 2022-03-17 DIAGNOSIS — E119 Type 2 diabetes mellitus without complications: Secondary | ICD-10-CM | POA: Diagnosis not present

## 2022-03-17 DIAGNOSIS — Z794 Long term (current) use of insulin: Secondary | ICD-10-CM | POA: Diagnosis not present

## 2022-03-17 DIAGNOSIS — I2510 Atherosclerotic heart disease of native coronary artery without angina pectoris: Secondary | ICD-10-CM | POA: Diagnosis not present

## 2022-03-21 DIAGNOSIS — Z23 Encounter for immunization: Secondary | ICD-10-CM | POA: Diagnosis not present

## 2022-04-11 ENCOUNTER — Other Ambulatory Visit: Payer: Self-pay | Admitting: Rheumatology

## 2022-04-11 NOTE — Telephone Encounter (Signed)
Next Visit: 06/21/2022  Last Visit: 01/19/2022  Last Fill: 01/03/2022  DX:  Rheumatoid arthritis involving multiple sites with positive rheumatoid factor   Current Dose per office note 01/19/2022: methotrexate 3 tablets p.o. weekly   Labs: 01/19/2022 Elevated WBCs due to chronic prednisone use.  Creatinine is mildly elevated and is stable.  Glucose is elevated.   Okay to refill MTX?

## 2022-04-25 ENCOUNTER — Other Ambulatory Visit: Payer: Self-pay | Admitting: *Deleted

## 2022-04-25 DIAGNOSIS — Z79899 Other long term (current) drug therapy: Secondary | ICD-10-CM

## 2022-04-26 ENCOUNTER — Other Ambulatory Visit: Payer: Self-pay | Admitting: Physician Assistant

## 2022-04-26 LAB — COMPLETE METABOLIC PANEL WITH GFR
AG Ratio: 1.2 (calc) (ref 1.0–2.5)
ALT: 14 U/L (ref 6–29)
AST: 17 U/L (ref 10–35)
Albumin: 4 g/dL (ref 3.6–5.1)
Alkaline phosphatase (APISO): 59 U/L (ref 37–153)
BUN/Creatinine Ratio: 17 (calc) (ref 6–22)
BUN: 21 mg/dL (ref 7–25)
CO2: 25 mmol/L (ref 20–32)
Calcium: 9.5 mg/dL (ref 8.6–10.4)
Chloride: 105 mmol/L (ref 98–110)
Creat: 1.23 mg/dL — ABNORMAL HIGH (ref 0.60–0.95)
Globulin: 3.3 g/dL (calc) (ref 1.9–3.7)
Glucose, Bld: 164 mg/dL — ABNORMAL HIGH (ref 65–99)
Potassium: 4.5 mmol/L (ref 3.5–5.3)
Sodium: 141 mmol/L (ref 135–146)
Total Bilirubin: 0.6 mg/dL (ref 0.2–1.2)
Total Protein: 7.3 g/dL (ref 6.1–8.1)
eGFR: 43 mL/min/{1.73_m2} — ABNORMAL LOW (ref >=60)

## 2022-04-26 LAB — CBC WITH DIFFERENTIAL/PLATELET
Absolute Monocytes: 1016 cells/uL — ABNORMAL HIGH (ref 200–950)
Basophils Absolute: 106 cells/uL (ref 0–200)
Basophils Relative: 0.8 %
Eosinophils Absolute: 554 cells/uL — ABNORMAL HIGH (ref 15–500)
Eosinophils Relative: 4.2 %
HCT: 38.3 % (ref 35.0–45.0)
Hemoglobin: 12.6 g/dL (ref 11.7–15.5)
Lymphs Abs: 1439 cells/uL (ref 850–3900)
MCH: 30.4 pg (ref 27.0–33.0)
MCHC: 32.9 g/dL (ref 32.0–36.0)
MCV: 92.5 fL (ref 80.0–100.0)
MPV: 10.4 fL (ref 7.5–12.5)
Monocytes Relative: 7.7 %
Neutro Abs: 10085 cells/uL — ABNORMAL HIGH (ref 1500–7800)
Neutrophils Relative %: 76.4 %
Platelets: 332 10*3/uL (ref 140–400)
RBC: 4.14 10*6/uL (ref 3.80–5.10)
RDW: 13 % (ref 11.0–15.0)
Total Lymphocyte: 10.9 %
WBC: 13.2 10*3/uL — ABNORMAL HIGH (ref 3.8–10.8)

## 2022-04-26 NOTE — Progress Notes (Signed)
Glucose is elevated.  Please notify patient and forward results to her PCP.  Creatinine is elevated and stable.  Most likely due to use of methotrexate and diuretics.  White cell count is elevated and stable as she is on prednisone.

## 2022-04-26 NOTE — Telephone Encounter (Signed)
Next Visit: 06/21/2022   Last Visit: 01/19/2022   Last Fill: 04/20/2021  DX:  Rheumatoid arthritis involving multiple sites with positive rheumatoid factor    Current Dose per office note 09/11/9299: folic acid 1 mg  daily  Okay to refill Folic Acid?

## 2022-05-09 DIAGNOSIS — E119 Type 2 diabetes mellitus without complications: Secondary | ICD-10-CM | POA: Diagnosis not present

## 2022-05-09 DIAGNOSIS — H3581 Retinal edema: Secondary | ICD-10-CM | POA: Diagnosis not present

## 2022-05-09 DIAGNOSIS — H35371 Puckering of macula, right eye: Secondary | ICD-10-CM | POA: Diagnosis not present

## 2022-05-09 DIAGNOSIS — H33042 Retinal detachment with retinal dialysis, left eye: Secondary | ICD-10-CM | POA: Diagnosis not present

## 2022-05-10 DIAGNOSIS — F341 Dysthymic disorder: Secondary | ICD-10-CM | POA: Diagnosis not present

## 2022-05-10 DIAGNOSIS — I5030 Unspecified diastolic (congestive) heart failure: Secondary | ICD-10-CM | POA: Diagnosis not present

## 2022-05-10 DIAGNOSIS — I700 Atherosclerosis of aorta: Secondary | ICD-10-CM | POA: Diagnosis not present

## 2022-05-10 DIAGNOSIS — I480 Paroxysmal atrial fibrillation: Secondary | ICD-10-CM | POA: Diagnosis not present

## 2022-05-10 DIAGNOSIS — E782 Mixed hyperlipidemia: Secondary | ICD-10-CM | POA: Diagnosis not present

## 2022-05-10 DIAGNOSIS — M353 Polymyalgia rheumatica: Secondary | ICD-10-CM | POA: Diagnosis not present

## 2022-05-10 DIAGNOSIS — M069 Rheumatoid arthritis, unspecified: Secondary | ICD-10-CM | POA: Diagnosis not present

## 2022-05-10 DIAGNOSIS — D6869 Other thrombophilia: Secondary | ICD-10-CM | POA: Diagnosis not present

## 2022-05-10 DIAGNOSIS — I25119 Atherosclerotic heart disease of native coronary artery with unspecified angina pectoris: Secondary | ICD-10-CM | POA: Diagnosis not present

## 2022-05-10 DIAGNOSIS — E1122 Type 2 diabetes mellitus with diabetic chronic kidney disease: Secondary | ICD-10-CM | POA: Diagnosis not present

## 2022-05-10 DIAGNOSIS — N1831 Chronic kidney disease, stage 3a: Secondary | ICD-10-CM | POA: Diagnosis not present

## 2022-05-10 DIAGNOSIS — I10 Essential (primary) hypertension: Secondary | ICD-10-CM | POA: Diagnosis not present

## 2022-05-11 DIAGNOSIS — M48062 Spinal stenosis, lumbar region with neurogenic claudication: Secondary | ICD-10-CM | POA: Diagnosis not present

## 2022-05-26 DIAGNOSIS — R300 Dysuria: Secondary | ICD-10-CM | POA: Diagnosis not present

## 2022-05-26 DIAGNOSIS — N390 Urinary tract infection, site not specified: Secondary | ICD-10-CM | POA: Diagnosis not present

## 2022-05-30 ENCOUNTER — Other Ambulatory Visit: Payer: Self-pay | Admitting: Interventional Cardiology

## 2022-05-30 DIAGNOSIS — I480 Paroxysmal atrial fibrillation: Secondary | ICD-10-CM

## 2022-06-07 NOTE — Progress Notes (Signed)
Office Visit Note  Patient: Sophia Ward             Date of Birth: 1933/05/17           MRN: [id number]             PCP: [name], MD Referring: [name], MD Visit Date: 06/21/2022 Occupation: '@GUAROCC'$ @  Subjective:  Medication monitoring   History of Present Illness: Sophia Ward is a 89 y.o. female with history of seropositive rheumatoid arthritis and osteoarthritis.  She is taking Methotrexate 2.5 mg 3 tablets by mouth every 7 days, folic acid 1 mg daily, and prednisone 3 mg po daily.  She is tolerating methotrexate without any side effects and has not missed any doses recently.  She denies any signs or symptoms of a rheumatoid arthritis flare recently.  She denies any signs or symptoms of a polymyalgia rheumatica flare.  She continues to experience some stiffness in the morning which improves with activity.  She has intermittent discomfort in her lower back but has injections every 3 to 4 months by Dr. Mina Marble.  She denies any recent falls or fractures.  She continues to take a calcium vitamin D supplement daily.  She denies any new medical conditions.  She has not any recent or recurrent infections.  She received the annual flu shot and COVID-19 booster.      Activities of Daily Living:  Patient reports morning stiffness for 1 hour.   Patient Denies nocturnal pain.  Difficulty dressing/grooming: Denies Difficulty climbing stairs: Reports Difficulty getting out of chair: Denies Difficulty using hands for taps, buttons, cutlery, and/or writing: Denies  Review of Systems  Constitutional:  Negative for fatigue.  HENT:  Positive for mouth dryness. Negative for mouth sores.   Eyes:  Positive for dryness.  Respiratory:  Positive for shortness of breath.   Cardiovascular:  Negative for chest pain and palpitations.  Gastrointestinal:  Positive for diarrhea. Negative for blood in stool and constipation.  Endocrine: Negative for increased urination.   Genitourinary:  Negative for involuntary urination.  Musculoskeletal:  Positive for morning stiffness. Negative for joint pain, gait problem, joint pain, joint swelling, myalgias, muscle weakness, muscle tenderness and myalgias.  Skin:  Negative for color change, rash, hair loss and sensitivity to sunlight.  Allergic/Immunologic: Negative for susceptible to infections.  Neurological:  Negative for dizziness and headaches.  Hematological:  Negative for swollen glands.  Psychiatric/Behavioral:  Negative for depressed mood and sleep disturbance. The patient is not nervous/anxious.     PMFS History:  Patient Active Problem List   Diagnosis Date Noted   Acute on chronic diastolic heart failure ([place]) 03/04/2020   S/P TAVR (transcatheter aortic valve replacement) 03/03/2020   Diabetes mellitus without complication (HCC)    Pulmonary nodules    Lesion of pancreas    Severe aortic stenosis    Pressure injury of skin 01/15/2020   Iron deficiency anemia 01/06/2020   Symptomatic anemia 10/23/2019   Anemia 10/22/2019   Polymyalgia rheumatica ([place]) 11/25/2016   Primary osteoarthritis of both hands 11/25/2016   Bilateral primary osteoarthritis of knee 11/25/2016   Osteopenia of multiple sites 11/25/2016   Chronic gout involving toe without tophus 10/28/2016   Osteoarthritis of lumbar spine 10/28/2016   History of gastroesophageal reflux (GERD) 10/27/2016   Rheumatoid arthritis involving multiple sites with positive rheumatoid factor ([place]) 10/14/2016   Coronary atherosclerosis of native coronary artery 01/30/2014   Mixed hyperlipidemia 01/30/2014   Essential hypertension, benign 01/30/2014   Obesity,  unspecified 01/30/2014    Past Medical History:  Diagnosis Date   Allergic rhinitis    Anemia 09/2019   Arthritis    hands   BPPV (benign paroxysmal positional vertigo)    Coronary artery disease    Diabetes mellitus without complication ([place])    Essential hypertension, benign 01/30/2014    GERD (gastroesophageal reflux disease)    Hypertension    Lesion of pancreas    Mixed hyperlipidemia 01/30/2014   Myocardial infarction ([place]) 2004   mild MI-no damage- had stents   Osteopenia    Post-menopausal    Pulmonary nodules    Rheumatoid arthritis (HCC)    S/P TAVR (transcatheter aortic valve replacement) 03/03/2020   s/p TAVR with a 23 mm Edwards S3U via the TF approach by Drs Burt Knack and Roxy Manns   Severe aortic stenosis    Spinal stenosis    Stenosing tenosynovitis     Family History  Problem Relation Age of Onset   CAD Mother    Heart attack Mother    CAD Father    Heart Problems Brother        open heart surgery    Arthritis Daughter        psoriatic arthritis    Past Surgical History:  Procedure Laterality Date   ABDOMINAL HYSTERECTOMY     APPENDECTOMY     BIOPSY  10/25/2019   Procedure: BIOPSY;  Surgeon: Otis Brace, MD;  Location: [facility];  Service: Gastroenterology;;   BIOPSY  10/26/2019   Procedure: BIOPSY;  Surgeon: Otis Brace, MD;  Location: [facility];  Service: Gastroenterology;;   CARDIAC CATHETERIZATION  2004   carpel tunnel     COLONOSCOPY WITH PROPOFOL N/A 10/30/2012   Procedure: COLONOSCOPY WITH PROPOFOL;  Surgeon: Garlan Fair, MD;  Location: [facility];  Service: Endoscopy;  Laterality: N/A;   COLONOSCOPY WITH PROPOFOL N/A 10/26/2019   Procedure: COLONOSCOPY WITH PROPOFOL;  Surgeon: Otis Brace, MD;  Location: [facility];  Service: Gastroenterology;  Laterality: N/A;   CORONARY ANGIOPLASTY  2004   CORONARY ATHERECTOMY N/A 02/21/2020   Procedure: CORONARY ATHERECTOMY;  Surgeon: Jettie Booze, MD;  Location: [facility];  Service: Cardiovascular;  Laterality: N/A;   CORONARY STENT INTERVENTION N/A 02/21/2020   Procedure: CORONARY STENT INTERVENTION;  Surgeon: Jettie Booze, MD;  Location: [facility];  Service: Cardiovascular;  Laterality: N/A;   DILATION AND CURETTAGE OF UTERUS      ESOPHAGOGASTRODUODENOSCOPY N/A 10/25/2019   Procedure: ESOPHAGOGASTRODUODENOSCOPY (EGD);  Surgeon: Otis Brace, MD;  Location: [facility];  Service: Gastroenterology;  Laterality: N/A;   ESOPHAGOGASTRODUODENOSCOPY (EGD) WITH PROPOFOL N/A 10/30/2012   Procedure: ESOPHAGOGASTRODUODENOSCOPY (EGD) WITH PROPOFOL;  Surgeon: Garlan Fair, MD;  Location: [facility];  Service: Endoscopy;  Laterality: N/A;   EYE SURGERY     bilateral cataract with lens implant   EYE SURGERY     INJECTION OF SILICONE OIL Left 10/14/6158   Procedure: Injection Of Silicone Oil;  Surgeon: Jalene Mullet, MD;  Location: [facility];  Service: Ophthalmology;  Laterality: Left;   INTRAVASCULAR PRESSURE WIRE/FFR STUDY N/A 01/29/2020   Procedure: INTRAVASCULAR PRESSURE WIRE/FFR STUDY;  Surgeon: Jettie Booze, MD;  Location: [facility];  Service: Cardiovascular;  Laterality: N/A;   INTRAVASCULAR ULTRASOUND/IVUS N/A 02/21/2020   Procedure: Intravascular Ultrasound/IVUS;  Surgeon: Jettie Booze, MD;  Location: [facility];  Service: Cardiovascular;  Laterality: N/A;   left shouler surgery     PHOTOCOAGULATION WITH LASER Left 02/12/2019  Procedure: Photocoagulation With Laser;  Surgeon: Jalene Mullet, MD;  Location: [facility];  Service: Ophthalmology;  Laterality: Left;   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 02/12/2019   Procedure: REPAIR OF COMPLEX TRACTION RETINAL DETACHMENT;  Surgeon: Jalene Mullet, MD;  Location: [facility];  Service: Ophthalmology;  Laterality: Left;   RIGHT/LEFT HEART CATH AND CORONARY ANGIOGRAPHY N/A 01/29/2020   Procedure: RIGHT/LEFT HEART CATH AND CORONARY ANGIOGRAPHY;  Surgeon: Jettie Booze, MD;  Location: [facility];  Service: Cardiovascular;  Laterality: N/A;   TEE WITHOUT CARDIOVERSION N/A 03/03/2020   Procedure: TRANSESOPHAGEAL ECHOCARDIOGRAM (TEE);  Surgeon: Sherren Mocha, MD;  Location: [facility];  Service: Open Heart Surgery;  Laterality: N/A;    TONSILLECTOMY     TRANSCATHETER AORTIC VALVE REPLACEMENT, TRANSFEMORAL  03/03/2020   TRANSCATHETER AORTIC VALVE REPLACEMENT, TRANSFEMORAL N/A 03/03/2020   Procedure: TRANSCATHETER AORTIC VALVE REPLACEMENT, TRANSFEMORAL USING EDWARDS SAPIEN 3 23 MM AORTIC VALVE.;  Surgeon: Sherren Mocha, MD;  Location: [facility];  Service: Open Heart Surgery;  Laterality: N/A;   VITRECTOMY 25 GAUGE WITH SCLERAL BUCKLE Left 02/12/2019   Procedure: Vitrectomy 25 Gauge With Scleral Buckle;  Surgeon: Jalene Mullet, MD;  Location: [facility];  Service: Ophthalmology;  Laterality: Left;   Social History   Social History Narrative   Not on file   Immunization History  Administered Date(s) Administered   Fluad Quad(high Dose 65+) 03/04/2020   Influenza, High Dose Seasonal PF 03/09/2014, 04/09/2015, 03/16/2016, 02/25/2017, 03/20/2018   PFIZER(Purple Top)SARS-COV-2 Vaccination 06/11/2019, 07/01/2019, 03/19/2020   Zoster Recombinat (Shingrix) 02/07/2018, 06/04/2018     Objective: Vital Signs: BP (!) 112/59 (BP Location: Left Arm, Patient Position: Sitting, Cuff Size: Large)   Pulse (!) 57   Resp 17   Ht 5' 2.5" (1.588 m)   Wt 183 lb (83 kg)   BMI 32.94 kg/m    Physical Exam Vitals and nursing note reviewed.  Constitutional:      Appearance: She is well-developed.  HENT:     Head: Normocephalic and atraumatic.  Eyes:     Conjunctiva/sclera: Conjunctivae normal.  Cardiovascular:     Rate and Rhythm: Normal rate and regular rhythm.     Heart sounds: Murmur heard.  Pulmonary:     Effort: Pulmonary effort is normal.     Breath sounds: Normal breath sounds.  Abdominal:     General: Bowel sounds are normal.     Palpations: Abdomen is soft.  Musculoskeletal:     Cervical back: Normal range of motion.  Skin:    General: Skin is warm and dry.     Capillary Refill: Capillary refill takes less than 2 seconds.  Neurological:     Mental Status: She is alert and oriented to person, place, and time.  Psychiatric:         Behavior: Behavior normal.      Musculoskeletal Exam: C-spine has limited range of motion with lateral rotation.  Painful range of motion of the lumbar spine.  Thoracic kyphosis noted.  Shoulder joints, elbow joints, and wrist joints have good range of motion with no discomfort.  PIP and DIP thickening consistent with osteoarthritis of both hands.  Prominence of bilateral CMC joints.  Bilateral ring finger Dupuytren contracture noted.  No tenderness or synovitis over MCP joints.  Hip joints have slightly limited range of motion but no groin pain.  Knee joints have good range of motion with no warmth or effusion.  Ankle joints have good range of motion with no tenderness or synovitis.  CDAI Exam: CDAI Score: -- Patient Global: 5 mm; Provider Global: 5 mm Swollen: --; Tender: -- Joint Exam 06/21/2022   No joint exam has been documented for this visit   There is currently no information documented on the homunculus. Go to the Rheumatology activity and complete the homunculus joint exam.  Investigation: No additional findings.  Imaging: No results found.  Recent Labs: Lab Results  Component Value Date   WBC 13.2 (H) 04/25/2022   HGB 12.6 04/25/2022   PLT 332 04/25/2022   NA 141 04/25/2022   K 4.5 04/25/2022   CL 105 04/25/2022   CO2 25 04/25/2022   GLUCOSE 164 (H) 04/25/2022   BUN 21 04/25/2022   CREATININE 1.23 (H) 04/25/2022   BILITOT 0.6 04/25/2022   ALKPHOS 64 10/21/2020   AST 17 04/25/2022   ALT 14 04/25/2022   PROT 7.3 04/25/2022   ALBUMIN 3.5 10/21/2020   CALCIUM 9.5 04/25/2022   GFRAA 66 10/16/2020    Speciality Comments: No specialty comments available.  Procedures:  No procedures performed Allergies: Codeine, Glimepiride, Jardiance [empagliflozin], Metformin and related, and Penicillins   Assessment / Plan:     Visit Diagnoses: Rheumatoid arthritis involving multiple sites with positive rheumatoid factor (HCC) - RF 309, ANA negative, CRP 5.8, synovitis:  She has no joint tenderness or synovitis on examination today.  She has not had any signs or symptoms of a rheumatoid arthritis flare.  She has clinically been doing well taking methotrexate 3 tablets by mouth once weekly along with prednisone 3 mg daily.  She is tolerating combination therapy without any side effects and has not missed any doses recently.  She experiences joint stiffness first thing in the morning which improves with activity.  No medication changes will be made at this time.  She was advised to notify us if she develops signs or symptoms of a flare.  She will follow-up in the office in 5 months or sooner if needed.  High risk medication use - Methotrexate 2.5 mg 3 tablets by mouth every 7 days, folic acid 1 mg  daily, and prednisone 3 mg po daily.  CBC and CMP updated on 04/25/22. Her next lab work will be due in February and every 3 months.  Standing orders for CBC and CMP were placed today. No recent or recurrent infections.  Discussed the importance of holding methotrexate if she develops signs or symptoms of an infection and to resume once the infection has completely cleared.  She received the annual flu shot and COVID-19 booster. She is aware of the risks of long-term prednisone use.  - Plan: CBC with Differential/Platelet, COMPLETE METABOLIC PANEL WITH GFR  Polymyalgia rheumatica ([place]): She has not had any signs or symptoms of a polymyalgia rheumatica flare.  She has no difficulty raising her arms above her head or rising from a seated position.  She remains on methotrexate 3 tablets once weekly and prednisone 3 mg daily.  She has been unable to taper prednisone.  She was advised to notify us if she develops signs or symptoms of a flare.  Primary osteoarthritis of both hands: She has PIP and DIP thickening consistent with osteoarthritis of both hands.  CMC joint prominence noted bilaterally.  Discussed the importance of joint protection and muscle strengthening.  She was  encouraged to perform hand exercises daily.  Primary osteoarthritis of both knees: She has good range of motion of both knee joints on examination today.  No warmth or effusion noted.  Primary osteoarthritis of  both feet: She is not experiencing any increased discomfort in her feet at this time.  She has good range of motion of both ankle joints with no tenderness or synovitis.  Some pedal edema noted bilaterally.  She has been wearing proper fitting shoes.  History of humerus fracture - Followed by Dr. Rhona Raider.  DDD (degenerative disc disease), lumbar - With a spinal stenosis.  She has chronic pain and discomfort.  She is followed by Dr. Mina Marble.  She has injections performed every 3 to 4 months.  Her last injection was administered in December 2023 which was helpful.  Osteopenia of multiple sites - DEXA 05/15/18: The BMD measured at Femur Neck Left is 0.820 g/cm2 with a T-score of -1.6.  Patient has chosen not to proceed with repeat bone densities at this time.  She remains on calcium and vitamin D supplementation daily.  She has not had any recent falls or fractures.  Other medical conditions are listed as follows:  Status post aortic valve replacement - October 2021.  Patient is on chronic anticoagulation.  History of hypertension: Blood pressure was 112/59 today in the office.  Chronic anticoagulation - on Plavix and Eliquis  History of hyperlipidemia  History of diabetes mellitus  History of gastroesophageal reflux (GERD)  Orders: Orders Placed This Encounter  Procedures   CBC with Differential/Platelet   COMPLETE METABOLIC PANEL WITH GFR   No orders of the defined types were placed in this encounter.     Follow-Up Instructions: Return in about 5 months (around 11/20/2022) for Rheumatoid arthritis, Osteoarthritis.   Ofilia Neas, PA-C  Note - This record has been created using Dragon software.  Chart creation errors have been sought, but may not always  have been  located. Such creation errors do not reflect on  the standard of medical care.

## 2022-06-14 ENCOUNTER — Encounter: Payer: Self-pay | Admitting: Hematology

## 2022-06-21 ENCOUNTER — Encounter: Payer: Self-pay | Admitting: Hematology

## 2022-06-21 ENCOUNTER — Encounter: Payer: Self-pay | Admitting: Physician Assistant

## 2022-06-21 ENCOUNTER — Ambulatory Visit: Payer: Medicare Other | Attending: Physician Assistant | Admitting: Physician Assistant

## 2022-06-21 VITALS — BP 112/59 | HR 57 | Resp 17 | Ht 62.5 in | Wt 183.0 lb

## 2022-06-21 DIAGNOSIS — M170 Bilateral primary osteoarthritis of knee: Secondary | ICD-10-CM

## 2022-06-21 DIAGNOSIS — Z8719 Personal history of other diseases of the digestive system: Secondary | ICD-10-CM | POA: Diagnosis not present

## 2022-06-21 DIAGNOSIS — M0579 Rheumatoid arthritis with rheumatoid factor of multiple sites without organ or systems involvement: Secondary | ICD-10-CM | POA: Diagnosis not present

## 2022-06-21 DIAGNOSIS — M19072 Primary osteoarthritis, left ankle and foot: Secondary | ICD-10-CM | POA: Diagnosis not present

## 2022-06-21 DIAGNOSIS — Z8781 Personal history of (healed) traumatic fracture: Secondary | ICD-10-CM

## 2022-06-21 DIAGNOSIS — Z79899 Other long term (current) drug therapy: Secondary | ICD-10-CM | POA: Diagnosis not present

## 2022-06-21 DIAGNOSIS — M19042 Primary osteoarthritis, left hand: Secondary | ICD-10-CM | POA: Insufficient documentation

## 2022-06-21 DIAGNOSIS — M19041 Primary osteoarthritis, right hand: Secondary | ICD-10-CM

## 2022-06-21 DIAGNOSIS — Z7901 Long term (current) use of anticoagulants: Secondary | ICD-10-CM | POA: Diagnosis not present

## 2022-06-21 DIAGNOSIS — M5136 Other intervertebral disc degeneration, lumbar region: Secondary | ICD-10-CM | POA: Insufficient documentation

## 2022-06-21 DIAGNOSIS — Z952 Presence of prosthetic heart valve: Secondary | ICD-10-CM | POA: Diagnosis not present

## 2022-06-21 DIAGNOSIS — M19071 Primary osteoarthritis, right ankle and foot: Secondary | ICD-10-CM | POA: Diagnosis not present

## 2022-06-21 DIAGNOSIS — Z8679 Personal history of other diseases of the circulatory system: Secondary | ICD-10-CM | POA: Diagnosis not present

## 2022-06-21 DIAGNOSIS — M8589 Other specified disorders of bone density and structure, multiple sites: Secondary | ICD-10-CM

## 2022-06-21 DIAGNOSIS — M353 Polymyalgia rheumatica: Secondary | ICD-10-CM | POA: Diagnosis not present

## 2022-06-21 DIAGNOSIS — Z8639 Personal history of other endocrine, nutritional and metabolic disease: Secondary | ICD-10-CM

## 2022-06-21 DIAGNOSIS — M51369 Other intervertebral disc degeneration, lumbar region without mention of lumbar back pain or lower extremity pain: Secondary | ICD-10-CM

## 2022-06-21 NOTE — Patient Instructions (Signed)
Standing Labs We placed an order today for your standing lab work.   Please have your standing labs drawn at end of February and every 3 months   Please have your labs drawn 2 weeks prior to your appointment so that the provider can discuss your lab results at your appointment.  Please note that you may see your imaging and lab results in [facility] before we have reviewed them. We will contact you once all results are reviewed. Please allow our office up to 72 hours to thoroughly review all of the results before contacting the office for clarification of your results.  Lab hours are:   Monday through Thursday from 8:00 am -12:30 pm and 1:00 pm-5:00 pm and Friday from 8:00 am-12:00 pm.  Please be advised, all patients with office appointments requiring lab work will take precedent over walk-in lab work.   Labs are drawn by [facility]. Please bring your co-pay at the time of your lab draw.  You may receive a bill from [facility] for your lab work.  Please note if you are on Hydroxychloroquine and and an order has been placed for a Hydroxychloroquine level, you will need to have it drawn 4 hours or more after your last dose.  If you wish to have your labs drawn at another location, please call the office 24 hours in advance so we can fax the orders.  The office is located at [address] [place], [place], [place] [address] No appointment is necessary.    If you have any questions regarding directions or hours of operation,  please call [phone].   As a reminder, please drink plenty of water prior to coming for your lab work. Thanks!

## 2022-06-27 DIAGNOSIS — N3001 Acute cystitis with hematuria: Secondary | ICD-10-CM | POA: Diagnosis not present

## 2022-07-11 ENCOUNTER — Other Ambulatory Visit: Payer: Self-pay

## 2022-07-11 MED ORDER — PREDNISONE 1 MG PO TABS: 3.0000 mg | ORAL_TABLET | Freq: Every day | ORAL | 0 refills | Status: DC

## 2022-07-11 MED ORDER — METHOTREXATE SODIUM 2.5 MG PO TABS: ORAL_TABLET | ORAL | 0 refills | Status: DC

## 2022-07-11 NOTE — Telephone Encounter (Signed)
Next Visit: 11/24/2022  Last Visit: 06/21/2022  Last Fill: 04/11/2022  DX: Rheumatoid arthritis involving multiple sites with positive rheumatoid factor   Current Dose per office note 06/21/2022: Methotrexate 2.5 mg 3 tablets by mouth every 7 days prednisone 3 mg po daily   Labs: 04/25/2022 Glucose is elevated.  Creatinine is elevated and stable.  Most likely due to use of methotrexate and diuretics.  White cell count is elevated and stable as she is on prednisone.   Okay to refill MTX?

## 2022-07-11 NOTE — Addendum Note (Signed)
Addended by: Carole Binning on: 07/11/2022 03:01 PM   Modules accepted: Orders

## 2022-07-11 NOTE — Addendum Note (Signed)
Addended by: Ofilia Neas on: 07/11/2022 03:47 PM   Modules accepted: Orders

## 2022-07-11 NOTE — Telephone Encounter (Signed)
Received a call from patient requesting medication refill for methotrexate 2.5 mg and prednisone 1 mg to be sent to [facility] in [place] on [address].

## 2022-07-25 ENCOUNTER — Other Ambulatory Visit: Payer: Self-pay | Admitting: *Deleted

## 2022-07-25 DIAGNOSIS — Z79899 Other long term (current) drug therapy: Secondary | ICD-10-CM

## 2022-07-26 LAB — CBC WITH DIFFERENTIAL/PLATELET
Absolute Monocytes: 1863 cells/uL — ABNORMAL HIGH (ref 200–950)
Basophils Absolute: 130 cells/uL (ref 0–200)
Basophils Relative: 0.8 %
Eosinophils Absolute: 583 cells/uL — ABNORMAL HIGH (ref 15–500)
Eosinophils Relative: 3.6 %
HCT: 41.2 % (ref 35.0–45.0)
Hemoglobin: 13.7 g/dL (ref 11.7–15.5)
Lymphs Abs: 2867 cells/uL (ref 850–3900)
MCH: 30.1 pg (ref 27.0–33.0)
MCHC: 33.3 g/dL (ref 32.0–36.0)
MCV: 90.5 fL (ref 80.0–100.0)
MPV: 10.5 fL (ref 7.5–12.5)
Monocytes Relative: 11.5 %
Neutro Abs: 10757 cells/uL — ABNORMAL HIGH (ref 1500–7800)
Neutrophils Relative %: 66.4 %
Platelets: 327 10*3/uL (ref 140–400)
RBC: 4.55 10*6/uL (ref 3.80–5.10)
RDW: 14.2 % (ref 11.0–15.0)
Total Lymphocyte: 17.7 %
WBC: 16.2 10*3/uL — ABNORMAL HIGH (ref 3.8–10.8)

## 2022-07-26 LAB — COMPLETE METABOLIC PANEL WITH GFR
AG Ratio: 1.4 (calc) (ref 1.0–2.5)
ALT: 15 U/L (ref 6–29)
AST: 17 U/L (ref 10–35)
Albumin: 4.1 g/dL (ref 3.6–5.1)
Alkaline phosphatase (APISO): 60 U/L (ref 37–153)
BUN/Creatinine Ratio: 23 (calc) — ABNORMAL HIGH (ref 6–22)
BUN: 24 mg/dL (ref 7–25)
CO2: 24 mmol/L (ref 20–32)
Calcium: 9.4 mg/dL (ref 8.6–10.4)
Chloride: 105 mmol/L (ref 98–110)
Creat: 1.03 mg/dL — ABNORMAL HIGH (ref 0.60–0.95)
Globulin: 3 g/dL (calc) (ref 1.9–3.7)
Glucose, Bld: 87 mg/dL (ref 65–99)
Potassium: 3.9 mmol/L (ref 3.5–5.3)
Sodium: 141 mmol/L (ref 135–146)
Total Bilirubin: 0.7 mg/dL (ref 0.2–1.2)
Total Protein: 7.1 g/dL (ref 6.1–8.1)
eGFR: 53 mL/min/{1.73_m2} — ABNORMAL LOW (ref >=60)

## 2022-07-26 NOTE — Progress Notes (Signed)
Creatinine is borderline elevated but has improved-1.03 and GFR is slightly low but has improved-53. Continue to avoid NSAIDs.  Rest of CMP WNL.  CBC stable.

## 2022-08-08 DIAGNOSIS — H338 Other retinal detachments: Secondary | ICD-10-CM | POA: Diagnosis not present

## 2022-08-08 DIAGNOSIS — H53452 Other localized visual field defect, left eye: Secondary | ICD-10-CM | POA: Diagnosis not present

## 2022-08-08 DIAGNOSIS — Z961 Presence of intraocular lens: Secondary | ICD-10-CM | POA: Diagnosis not present

## 2022-08-08 DIAGNOSIS — H35371 Puckering of macula, right eye: Secondary | ICD-10-CM | POA: Diagnosis not present

## 2022-08-08 DIAGNOSIS — H26492 Other secondary cataract, left eye: Secondary | ICD-10-CM | POA: Diagnosis not present

## 2022-08-08 DIAGNOSIS — H59812 Chorioretinal scars after surgery for detachment, left eye: Secondary | ICD-10-CM | POA: Diagnosis not present

## 2022-08-19 DIAGNOSIS — R350 Frequency of micturition: Secondary | ICD-10-CM | POA: Diagnosis not present

## 2022-08-19 DIAGNOSIS — N3000 Acute cystitis without hematuria: Secondary | ICD-10-CM | POA: Diagnosis not present

## 2022-08-19 DIAGNOSIS — R3915 Urgency of urination: Secondary | ICD-10-CM | POA: Diagnosis not present

## 2022-08-19 DIAGNOSIS — R300 Dysuria: Secondary | ICD-10-CM | POA: Diagnosis not present

## 2022-09-03 ENCOUNTER — Other Ambulatory Visit: Payer: Self-pay | Admitting: Interventional Cardiology

## 2022-09-03 DIAGNOSIS — I480 Paroxysmal atrial fibrillation: Secondary | ICD-10-CM

## 2022-09-05 NOTE — Telephone Encounter (Signed)
Prescription refill request for Eliquis received. Indication: afib  Last office visit: 02/04/2022, Dick  Scr: 1.03, 07/25/2022 Age: 89 yo  Weight: 83 kg   Refill sent.

## 2022-09-16 DIAGNOSIS — D649 Anemia, unspecified: Secondary | ICD-10-CM | POA: Diagnosis not present

## 2022-09-16 DIAGNOSIS — I2510 Atherosclerotic heart disease of native coronary artery without angina pectoris: Secondary | ICD-10-CM | POA: Diagnosis not present

## 2022-09-16 DIAGNOSIS — Z794 Long term (current) use of insulin: Secondary | ICD-10-CM | POA: Diagnosis not present

## 2022-09-16 DIAGNOSIS — R0602 Shortness of breath: Secondary | ICD-10-CM | POA: Diagnosis not present

## 2022-09-16 DIAGNOSIS — E119 Type 2 diabetes mellitus without complications: Secondary | ICD-10-CM | POA: Diagnosis not present

## 2022-09-29 DIAGNOSIS — M48062 Spinal stenosis, lumbar region with neurogenic claudication: Secondary | ICD-10-CM | POA: Diagnosis not present

## 2022-10-03 DIAGNOSIS — D509 Iron deficiency anemia, unspecified: Secondary | ICD-10-CM | POA: Diagnosis not present

## 2022-10-03 DIAGNOSIS — I5030 Unspecified diastolic (congestive) heart failure: Secondary | ICD-10-CM | POA: Diagnosis not present

## 2022-10-03 DIAGNOSIS — N1831 Chronic kidney disease, stage 3a: Secondary | ICD-10-CM | POA: Diagnosis not present

## 2022-10-03 DIAGNOSIS — R0609 Other forms of dyspnea: Secondary | ICD-10-CM | POA: Diagnosis not present

## 2022-10-03 DIAGNOSIS — L84 Corns and callosities: Secondary | ICD-10-CM | POA: Diagnosis not present

## 2022-10-04 ENCOUNTER — Other Ambulatory Visit: Payer: Self-pay | Admitting: Physician Assistant

## 2022-10-04 ENCOUNTER — Encounter: Payer: Self-pay | Admitting: *Deleted

## 2022-10-04 NOTE — Telephone Encounter (Signed)
Last Fill: 07/11/2022  Labs: 07/25/2022 Creatinine is borderline elevated but has improved-1.03 and GFR is slightly low but has improved-53. Rest of CMP WNL. CBC stable.  Next Visit: 11/24/2022  Last Visit: 06/21/2022  DX: Rheumatoid arthritis involving multiple sites with positive rheumatoid factor   Current Dose per office note 06/21/2022: Methotrexate 2.5 mg 3 tablets by mouth every 7 days   Okay to refill Methotrexate?

## 2022-10-05 ENCOUNTER — Telehealth: Payer: Self-pay | Admitting: Interventional Cardiology

## 2022-10-05 NOTE — Telephone Encounter (Signed)
Patient reports she started having shortness of breath with walking in her house about 3 weeks ago.  No problems prior to this.  Had 20 mg prn lasix ordered and was not taking on a regular basis.  Took every now and then.  She saw Dr Donette Larry this past Monday and Lasix was increased to 40 mg daily for the next 4-5 days.  Potassium was also started. Further instructions to come from cardiology.  Patient reports after taking lasix on Monday she lost 4 lbs.  Gained one pound back today.  Swelling in feet and ankles has improved.  Shortness of breath is better.  Patient reports she had iron study lab work and BNP done by Dr Sharl Ma in April.  Iron study was OK.  BNP was in 270's.  Patient reports Dr Donette Larry checked BNP on Monday but she has not heard results yet.  Will forward to Dr Eldridge Dace for review/recommendations

## 2022-10-05 NOTE — Telephone Encounter (Signed)
Pt c/o Shortness Of Breath: STAT if SOB developed within the last 24 hours or pt is noticeably SOB on the phone  1. Are you currently SOB (can you hear that pt is SOB on the phone)? No  2. How long have you been experiencing SOB? 3 weeks and wasn't getting any better  3. Are you SOB when sitting or when up moving around? Moving around   4. Are you currently experiencing any other symptoms? No.

## 2022-10-07 NOTE — Telephone Encounter (Signed)
Likely some volume overload.  WOuld use the prn Lasix 20 mg daily at least 3x/week when convenient to prevent fluid retention.

## 2022-10-07 NOTE — Telephone Encounter (Signed)
Patient notified.  She is feeling back to normal.  She has appointment  in June with Dr Eula Listen.  She will let us know if she develops shortness of breath again

## 2022-10-31 ENCOUNTER — Emergency Department (HOSPITAL_COMMUNITY): Payer: Medicare Other

## 2022-10-31 ENCOUNTER — Encounter (HOSPITAL_COMMUNITY): Payer: Self-pay

## 2022-10-31 ENCOUNTER — Other Ambulatory Visit: Payer: Self-pay

## 2022-10-31 ENCOUNTER — Inpatient Hospital Stay (HOSPITAL_COMMUNITY)
Admission: EM | Admit: 2022-10-31 | Discharge: 2022-11-04 | DRG: 291 | Disposition: A | Discharge status: Home / Self Care | Payer: Medicare Other | Attending: Internal Medicine | Admitting: Internal Medicine

## 2022-10-31 DIAGNOSIS — Z885 Allergy status to narcotic agent status: Secondary | ICD-10-CM | POA: Diagnosis not present

## 2022-10-31 DIAGNOSIS — I272 Pulmonary hypertension, unspecified: Secondary | ICD-10-CM | POA: Diagnosis not present

## 2022-10-31 DIAGNOSIS — M109 Gout, unspecified: Secondary | ICD-10-CM | POA: Diagnosis present

## 2022-10-31 DIAGNOSIS — Z955 Presence of coronary angioplasty implant and graft: Secondary | ICD-10-CM

## 2022-10-31 DIAGNOSIS — W07XXXA Fall from chair, initial encounter: Secondary | ICD-10-CM | POA: Diagnosis present

## 2022-10-31 DIAGNOSIS — Z8261 Family history of arthritis: Secondary | ICD-10-CM

## 2022-10-31 DIAGNOSIS — I2510 Atherosclerotic heart disease of native coronary artery without angina pectoris: Secondary | ICD-10-CM | POA: Diagnosis not present

## 2022-10-31 DIAGNOSIS — Z043 Encounter for examination and observation following other accident: Secondary | ICD-10-CM | POA: Diagnosis not present

## 2022-10-31 DIAGNOSIS — Z888 Allergy status to other drugs, medicaments and biological substances status: Secondary | ICD-10-CM | POA: Diagnosis not present

## 2022-10-31 DIAGNOSIS — E1122 Type 2 diabetes mellitus with diabetic chronic kidney disease: Secondary | ICD-10-CM | POA: Diagnosis present

## 2022-10-31 DIAGNOSIS — I252 Old myocardial infarction: Secondary | ICD-10-CM | POA: Diagnosis not present

## 2022-10-31 DIAGNOSIS — W19XXXA Unspecified fall, initial encounter: Secondary | ICD-10-CM | POA: Diagnosis not present

## 2022-10-31 DIAGNOSIS — I110 Hypertensive heart disease with heart failure: Secondary | ICD-10-CM | POA: Diagnosis not present

## 2022-10-31 DIAGNOSIS — R0902 Hypoxemia: Secondary | ICD-10-CM

## 2022-10-31 DIAGNOSIS — I130 Hypertensive heart and chronic kidney disease with heart failure and stage 1 through stage 4 chronic kidney disease, or unspecified chronic kidney disease: Principal | ICD-10-CM | POA: Diagnosis present

## 2022-10-31 DIAGNOSIS — S0181XA Laceration without foreign body of other part of head, initial encounter: Secondary | ICD-10-CM | POA: Diagnosis present

## 2022-10-31 DIAGNOSIS — Z79818 Long term (current) use of other agents affecting estrogen receptors and estrogen levels: Secondary | ICD-10-CM

## 2022-10-31 DIAGNOSIS — Z23 Encounter for immunization: Secondary | ICD-10-CM | POA: Diagnosis not present

## 2022-10-31 DIAGNOSIS — N390 Urinary tract infection, site not specified: Secondary | ICD-10-CM | POA: Diagnosis not present

## 2022-10-31 DIAGNOSIS — Z7901 Long term (current) use of anticoagulants: Secondary | ICD-10-CM

## 2022-10-31 DIAGNOSIS — Z87440 Personal history of urinary (tract) infections: Secondary | ICD-10-CM

## 2022-10-31 DIAGNOSIS — Z880 Allergy status to penicillin: Secondary | ICD-10-CM | POA: Diagnosis not present

## 2022-10-31 DIAGNOSIS — S0990XA Unspecified injury of head, initial encounter: Secondary | ICD-10-CM | POA: Diagnosis not present

## 2022-10-31 DIAGNOSIS — Z953 Presence of xenogenic heart valve: Secondary | ICD-10-CM

## 2022-10-31 DIAGNOSIS — Y92009 Unspecified place in unspecified non-institutional (private) residence as the place of occurrence of the external cause: Secondary | ICD-10-CM

## 2022-10-31 DIAGNOSIS — K219 Gastro-esophageal reflux disease without esophagitis: Secondary | ICD-10-CM | POA: Diagnosis present

## 2022-10-31 DIAGNOSIS — I2722 Pulmonary hypertension due to left heart disease: Secondary | ICD-10-CM | POA: Diagnosis present

## 2022-10-31 DIAGNOSIS — I480 Paroxysmal atrial fibrillation: Secondary | ICD-10-CM | POA: Diagnosis not present

## 2022-10-31 DIAGNOSIS — I5033 Acute on chronic diastolic (congestive) heart failure: Secondary | ICD-10-CM | POA: Diagnosis not present

## 2022-10-31 DIAGNOSIS — S01112A Laceration without foreign body of left eyelid and periocular area, initial encounter: Secondary | ICD-10-CM | POA: Diagnosis not present

## 2022-10-31 DIAGNOSIS — J9601 Acute respiratory failure with hypoxia: Secondary | ICD-10-CM | POA: Diagnosis not present

## 2022-10-31 DIAGNOSIS — M0579 Rheumatoid arthritis with rheumatoid factor of multiple sites without organ or systems involvement: Secondary | ICD-10-CM | POA: Diagnosis present

## 2022-10-31 DIAGNOSIS — R58 Hemorrhage, not elsewhere classified: Secondary | ICD-10-CM | POA: Diagnosis not present

## 2022-10-31 DIAGNOSIS — S199XXA Unspecified injury of neck, initial encounter: Secondary | ICD-10-CM | POA: Diagnosis not present

## 2022-10-31 DIAGNOSIS — Z7902 Long term (current) use of antithrombotics/antiplatelets: Secondary | ICD-10-CM

## 2022-10-31 DIAGNOSIS — N1831 Chronic kidney disease, stage 3a: Secondary | ICD-10-CM | POA: Diagnosis not present

## 2022-10-31 DIAGNOSIS — Z794 Long term (current) use of insulin: Secondary | ICD-10-CM

## 2022-10-31 DIAGNOSIS — E119 Type 2 diabetes mellitus without complications: Secondary | ICD-10-CM | POA: Diagnosis not present

## 2022-10-31 DIAGNOSIS — Z79899 Other long term (current) drug therapy: Secondary | ICD-10-CM

## 2022-10-31 DIAGNOSIS — I10 Essential (primary) hypertension: Secondary | ICD-10-CM | POA: Diagnosis not present

## 2022-10-31 DIAGNOSIS — E876 Hypokalemia: Secondary | ICD-10-CM | POA: Diagnosis present

## 2022-10-31 DIAGNOSIS — B952 Enterococcus as the cause of diseases classified elsewhere: Secondary | ICD-10-CM | POA: Diagnosis present

## 2022-10-31 DIAGNOSIS — Z8249 Family history of ischemic heart disease and other diseases of the circulatory system: Secondary | ICD-10-CM

## 2022-10-31 DIAGNOSIS — E782 Mixed hyperlipidemia: Secondary | ICD-10-CM | POA: Diagnosis present

## 2022-10-31 DIAGNOSIS — I700 Atherosclerosis of aorta: Secondary | ICD-10-CM | POA: Diagnosis not present

## 2022-10-31 DIAGNOSIS — I509 Heart failure, unspecified: Secondary | ICD-10-CM | POA: Diagnosis not present

## 2022-10-31 DIAGNOSIS — I6381 Other cerebral infarction due to occlusion or stenosis of small artery: Secondary | ICD-10-CM | POA: Diagnosis not present

## 2022-10-31 LAB — COMPREHENSIVE METABOLIC PANEL
ALT: 24 U/L (ref 0–44)
AST: 23 U/L (ref 15–41)
Albumin: 3.2 g/dL — ABNORMAL LOW (ref 3.5–5.0)
Alkaline Phosphatase: 54 U/L (ref 38–126)
Anion gap: 8 (ref 5–15)
BUN: 20 mg/dL (ref 8–23)
CO2: 23 mmol/L (ref 22–32)
Calcium: 8.7 mg/dL — ABNORMAL LOW (ref 8.9–10.3)
Chloride: 103 mmol/L (ref 98–111)
Creatinine, Ser: 1.23 mg/dL — ABNORMAL HIGH (ref 0.44–1.00)
GFR, Estimated: 43 mL/min — ABNORMAL LOW (ref >60)
Glucose, Bld: 198 mg/dL — ABNORMAL HIGH (ref 70–99)
Potassium: 4 mmol/L (ref 3.5–5.1)
Sodium: 134 mmol/L — ABNORMAL LOW (ref 135–145)
Total Bilirubin: 0.6 mg/dL (ref 0.3–1.2)
Total Protein: 6.8 g/dL (ref 6.5–8.1)

## 2022-10-31 LAB — I-STAT CHEM 8, ED
BUN: 27 mg/dL — ABNORMAL HIGH (ref 8–23)
Calcium, Ion: 1.1 mmol/L — ABNORMAL LOW (ref 1.15–1.40)
Chloride: 102 mmol/L (ref 98–111)
Creatinine, Ser: 1.2 mg/dL — ABNORMAL HIGH (ref 0.44–1.00)
Glucose, Bld: 194 mg/dL — ABNORMAL HIGH (ref 70–99)
HCT: 40 % (ref 36.0–46.0)
Hemoglobin: 13.6 g/dL (ref 12.0–15.0)
Potassium: 4.8 mmol/L (ref 3.5–5.1)
Sodium: 136 mmol/L (ref 135–145)
TCO2: 24 mmol/L (ref 22–32)

## 2022-10-31 LAB — URINALYSIS, ROUTINE W REFLEX MICROSCOPIC
Bilirubin Urine: NEGATIVE
Glucose, UA: NEGATIVE mg/dL
Ketones, ur: NEGATIVE mg/dL
Nitrite: NEGATIVE
Protein, ur: NEGATIVE mg/dL
Specific Gravity, Urine: 1.008 (ref 1.005–1.030)
WBC, UA: >50 WBC/hpf (ref 0–5)
pH: 5 (ref 5.0–8.0)

## 2022-10-31 LAB — CBC
HCT: 38.7 % (ref 36.0–46.0)
Hemoglobin: 12.7 g/dL (ref 12.0–15.0)
MCH: 30.4 pg (ref 26.0–34.0)
MCHC: 32.8 g/dL (ref 30.0–36.0)
MCV: 92.6 fL (ref 80.0–100.0)
Platelets: 342 10*3/uL (ref 150–400)
RBC: 4.18 MIL/uL (ref 3.87–5.11)
RDW: 14.3 % (ref 11.5–15.5)
WBC: 12.3 10*3/uL — ABNORMAL HIGH (ref 4.0–10.5)
nRBC: 0 % (ref 0.0–0.2)

## 2022-10-31 LAB — TROPONIN I (HIGH SENSITIVITY): Troponin I (High Sensitivity): 10 ng/L (ref <18)

## 2022-10-31 LAB — PROTIME-INR
INR: 1.3 — ABNORMAL HIGH (ref 0.8–1.2)
Prothrombin Time: 16.6 seconds — ABNORMAL HIGH (ref 11.4–15.2)

## 2022-10-31 LAB — ETHANOL: Alcohol, Ethyl (B): <10 mg/dL (ref <10)

## 2022-10-31 LAB — LACTIC ACID, PLASMA: Lactic Acid, Venous: 1.8 mmol/L (ref 0.5–1.9)

## 2022-10-31 LAB — BRAIN NATRIURETIC PEPTIDE: B Natriuretic Peptide: 309.9 pg/mL — ABNORMAL HIGH (ref 0.0–100.0)

## 2022-10-31 MED ORDER — TETANUS-DIPHTH-ACELL PERTUSSIS 5-2.5-18.5 LF-MCG/0.5 IM SUSY
0.5000 mL | PREFILLED_SYRINGE | Freq: Once | INTRAMUSCULAR | Status: AC
  Administered 2022-10-31: 0.5 mL via INTRAMUSCULAR
  Filled 2022-10-31: qty 0.5

## 2022-10-31 MED ORDER — LIDOCAINE-EPINEPHRINE-TETRACAINE (LET) TOPICAL GEL
3.0000 mL | Freq: Once | TOPICAL | Status: AC
  Administered 2022-10-31: 3 mL via TOPICAL
  Filled 2022-10-31: qty 3

## 2022-10-31 MED ORDER — ACETAMINOPHEN 325 MG PO TABS
650.0000 mg | ORAL_TABLET | Freq: Once | ORAL | Status: AC
  Administered 2022-10-31: 650 mg via ORAL
  Filled 2022-10-31: qty 2

## 2022-10-31 MED ORDER — IOHEXOL 350 MG/ML SOLN
75.0000 mL | Freq: Once | INTRAVENOUS | Status: AC | PRN
  Administered 2022-10-31: 75 mL via INTRAVENOUS

## 2022-10-31 MED ORDER — LIDOCAINE-EPINEPHRINE (PF) 2 %-1:200000 IJ SOLN
10.0000 mL | Freq: Once | INTRAMUSCULAR | Status: AC
  Administered 2022-10-31: 10 mL via INTRADERMAL
  Filled 2022-10-31: qty 20

## 2022-10-31 NOTE — ED Notes (Signed)
Trauma Response Nurse Documentation   Sophia Ward is a 89 y.o. female arriving to [facility] ED via [facility]  On Eliquis (apixaban) daily. Also on Plavix . Trauma was activated as a Level 2 by Charge RN based on the following trauma criteria Elderly patients > 65 with head trauma on anti-coagulation (excluding ASA).  Patient cleared for CT by Dr. Durwin Nora. Pt transported to CT with trauma response nurse present to monitor. RN remained with the patient throughout their absence from the department for clinical observation.   GCS 15.  History   Past Medical History:  Diagnosis Date   Allergic rhinitis    Anemia 09/2019   Arthritis    hands   BPPV (benign paroxysmal positional vertigo)    Coronary artery disease    Diabetes mellitus without complication (HCC)    Essential hypertension, benign 01/30/2014   GERD (gastroesophageal reflux disease)    Hypertension    Lesion of pancreas    Mixed hyperlipidemia 01/30/2014   Myocardial infarction (HCC) 2004   mild MI-no damage- had stents   Osteopenia    Post-menopausal    Pulmonary nodules    Rheumatoid arthritis (HCC)    S/P TAVR (transcatheter aortic valve replacement) 03/03/2020   s/p TAVR with a 23 mm Edwards S3U via the TF approach by Drs Excell Seltzer and Cornelius Moras   Severe aortic stenosis    Spinal stenosis    Stenosing tenosynovitis      Past Surgical History:  Procedure Laterality Date   ABDOMINAL HYSTERECTOMY     APPENDECTOMY     BIOPSY  10/25/2019   Procedure: BIOPSY;  Surgeon: Kathi Der, MD;  Location: [facility] ENDOSCOPY;  Service: Gastroenterology;;   BIOPSY  10/26/2019   Procedure: BIOPSY;  Surgeon: Kathi Der, MD;  Location: [facility] ENDOSCOPY;  Service: Gastroenterology;;   CARDIAC CATHETERIZATION  2004   carpel tunnel     COLONOSCOPY WITH PROPOFOL N/A 10/30/2012   Procedure: COLONOSCOPY WITH PROPOFOL;  Surgeon: Charolett Bumpers, MD;  Location: [facility] ENDOSCOPY;  Service: Endoscopy;  Laterality: N/A;   COLONOSCOPY WITH  PROPOFOL N/A 10/26/2019   Procedure: COLONOSCOPY WITH PROPOFOL;  Surgeon: Kathi Der, MD;  Location: [facility] ENDOSCOPY;  Service: Gastroenterology;  Laterality: N/A;   CORONARY ANGIOPLASTY  2004   CORONARY ATHERECTOMY N/A 02/21/2020   Procedure: CORONARY ATHERECTOMY;  Surgeon: Corky Crafts, MD;  Location: [facility] INVASIVE CV LAB;  Service: Cardiovascular;  Laterality: N/A;   CORONARY PRESSURE/FFR STUDY N/A 01/29/2020   Procedure: INTRAVASCULAR PRESSURE WIRE/FFR STUDY;  Surgeon: Corky Crafts, MD;  Location: [facility] INVASIVE CV LAB;  Service: Cardiovascular;  Laterality: N/A;   CORONARY STENT INTERVENTION N/A 02/21/2020   Procedure: CORONARY STENT INTERVENTION;  Surgeon: Corky Crafts, MD;  Location: [facility] INVASIVE CV LAB;  Service: Cardiovascular;  Laterality: N/A;   CORONARY ULTRASOUND/IVUS N/A 02/21/2020   Procedure: Intravascular Ultrasound/IVUS;  Surgeon: Corky Crafts, MD;  Location: [facility] INVASIVE CV LAB;  Service: Cardiovascular;  Laterality: N/A;   DILATION AND CURETTAGE OF UTERUS     ESOPHAGOGASTRODUODENOSCOPY N/A 10/25/2019   Procedure: ESOPHAGOGASTRODUODENOSCOPY (EGD);  Surgeon: Kathi Der, MD;  Location: [facility] ENDOSCOPY;  Service: Gastroenterology;  Laterality: N/A;   ESOPHAGOGASTRODUODENOSCOPY (EGD) WITH PROPOFOL N/A 10/30/2012   Procedure: ESOPHAGOGASTRODUODENOSCOPY (EGD) WITH PROPOFOL;  Surgeon: Charolett Bumpers, MD;  Location: [facility] ENDOSCOPY;  Service: Endoscopy;  Laterality: N/A;   EYE SURGERY     bilateral cataract with lens implant   EYE SURGERY     INJECTION OF SILICONE OIL Left  02/12/2019   Procedure: Injection Of Silicone Oil;  Surgeon: Carmela Rima, MD;  Location: [facility] OR;  Service: Ophthalmology;  Laterality: Left;   left shouler surgery     PHOTOCOAGULATION WITH LASER Left 02/12/2019   Procedure: Photocoagulation With Laser;  Surgeon: Carmela Rima, MD;  Location: [facility] OR;  Service: Ophthalmology;  Laterality: Left;   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 02/12/2019   Procedure: REPAIR OF COMPLEX TRACTION RETINAL DETACHMENT;  Surgeon: Carmela Rima, MD;  Location: [facility] OR;  Service: Ophthalmology;  Laterality: Left;   RIGHT/LEFT HEART CATH AND CORONARY ANGIOGRAPHY N/A 01/29/2020   Procedure: RIGHT/LEFT HEART CATH AND CORONARY ANGIOGRAPHY;  Surgeon: Corky Crafts, MD;  Location: [facility] INVASIVE CV LAB;  Service: Cardiovascular;  Laterality: N/A;   TEE WITHOUT CARDIOVERSION N/A 03/03/2020   Procedure: TRANSESOPHAGEAL ECHOCARDIOGRAM (TEE);  Surgeon: Tonny Bollman, MD;  Location: [facility] OR;  Service: Open Heart Surgery;  Laterality: N/A;   TONSILLECTOMY     TRANSCATHETER AORTIC VALVE REPLACEMENT, TRANSFEMORAL  03/03/2020   TRANSCATHETER AORTIC VALVE REPLACEMENT, TRANSFEMORAL N/A 03/03/2020   Procedure: TRANSCATHETER AORTIC VALVE REPLACEMENT, TRANSFEMORAL USING EDWARDS SAPIEN 3 23 MM AORTIC VALVE.;  Surgeon: Tonny Bollman, MD;  Location: [facility] OR;  Service: Open Heart Surgery;  Laterality: N/A;   VITRECTOMY 25 GAUGE WITH SCLERAL BUCKLE Left 02/12/2019   Procedure: Vitrectomy 25 Gauge With Scleral Buckle;  Surgeon: Carmela Rima, MD;  Location: [facility] OR;  Service: Ophthalmology;  Laterality: Left;       Initial Focused Assessment (If applicable, or please see trauma documentation):  Airway - Clear Breathing - Unlabored Circulation - Laceration to forehead over left eye- controlled with EMS bandage GCS 15  CT's Completed:   CT Head and CT C-Spine   Interventions:   Labs Xrays CT scans  Plan for disposition:    Consults completed:    Event Summary:  Pt to ED via [facility] - ground level fall, got feet tangled in cord to her lift chair. Hit head on wood floor. Bleeding controlled by bandage placed over laceration to left forehead.  Daughter at bedside. Daughter is medical POA    Bedside handoff with ED RN Dewitt Hoes.    Lesle Chris Sheilla Maris  Trauma Response RN  Please call TRN at [phone] for further assistance.

## 2022-10-31 NOTE — Progress Notes (Signed)
Chaplain checked in on pt, finding them alert and responsive.  Pt's daughter was present; joined shortly by pt's granddaughter.  Chaplain offered words of  quiet support.  Vernell Morgans Chaplain

## 2022-10-31 NOTE — ED Provider Notes (Signed)
..  Laceration Repair  Date/Time: 10/31/2022 9:02 PM  Performed by: Jeanelle Malling, PA Authorized by: Jeanelle Malling, PA   Consent:    Consent obtained:  Verbal   Consent given by:  Patient   Risks discussed:  Infection and pain Universal protocol:    Procedure explained and questions answered to patient or proxy's satisfaction: yes     Relevant documents present and verified: yes     Patient identity confirmed:  Verbally with patient and arm band Laceration details:    Location: above L eyebrow.   Length (cm):  2   Depth (mm):  2 Pre-procedure details:    Preparation:  Patient was prepped and draped in usual sterile fashion Exploration:    Limited defect created (wound extended): no     Imaging outcome: foreign body not noted     Contaminated: no   Treatment:    Area cleansed with:  Saline   Amount of cleaning:  Standard   Irrigation solution:  Sterile saline   Irrigation volume:  50   Irrigation method:  Tap and pressure wash   Visualized foreign bodies/material removed: no     Debridement:  Minimal Skin repair:    Repair method:  Sutures   Suture size:  6-0   Suture material:  Nylon   Suture technique:  Simple interrupted   Number of sutures:  6 Approximation:    Approximation:  Close Post-procedure details:    Dressing:  Antibiotic ointment   Procedure completion:  Tolerated well, no immediate complications     Jeanelle Malling, PA 10/31/22 2104    Gloris Manchester, MD 11/01/22 0011

## 2022-10-31 NOTE — ED Triage Notes (Signed)
See trauma notes please

## 2022-10-31 NOTE — ED Provider Notes (Signed)
[place] EMERGENCY DEPARTMENT AT [facility] Provider Note   CSN: [id number] Arrival date & time: 10/31/22  1706     History {Add pertinent medical, surgical, social history, OB history to HPI:1} No chief complaint on file.   Sophia Ward is a 89 y.o. female.  HPI Patient presents after a fall.  Medical history includes***.  She lives independently.  Shortly prior to arrival, she was reaching down to adjust her recliner.  She fell forward, striking the left side of her forehead on a hardwood floor.  When EMS arrived on scene, she was seated in a nearby location.  She was noted to have a tissue defect on the left forehead.  Bleeding was controlled with pressure bandage.  She was able to ambulate with EMS.  Vital signs during transit were reassuring.  Patient remained alert and oriented.  She is on Plavix and Eliquis.    Home Medications Prior to Admission medications   Medication Sig Start Date End Date Taking? Authorizing Provider  amLODipine (NORVASC) 10 MG tablet TAKE 1 TABLET BY MOUTH ONCE DAILY IN THE MORNING 02/03/21   Corky Crafts, MD  apixaban Everlene Balls) 5 MG TABS tablet Take 1 tablet by mouth twice daily 09/05/22   Corky Crafts, MD  atorvastatin (LIPITOR) 40 MG tablet Take 1 tablet (40 mg total) by mouth at bedtime. 04/30/19   Corky Crafts, MD  B-D ULTRAFINE III SHORT PEN 31G X 8 MM MISC 1 each by Other route in the morning, at noon, in the evening, and at bedtime.  07/29/19   [provider]  Calcium Carbonate-Vitamin D (CALCIUM + D PO) Take 1 tablet by mouth daily.     [provider]  Cholecalciferol (VITAMIN D3) 125 MCG (5000 UT) CAPS Take 5,000 Units by mouth daily.     [provider]  clopidogrel (PLAVIX) 75 MG tablet Take 4 tablets (300 mg) by mouth this evening, then take 1 tablet (75 mg) by mouth daily Patient taking differently: Take 75 mg by mouth daily. Take 4 tablets (300 mg) by mouth this evening, then  take 1 tablet (75 mg) by mouth daily 02/20/20   Corky Crafts, MD  desonide (DESOWEN) 0.05 % cream Apply 1 application  topically 2 (two) times daily as needed. Patient not taking: Reported on 06/21/2022    [provider]  escitalopram (LEXAPRO) 5 MG tablet Take 10 mg by mouth daily. 12/29/19   [provider]  estradiol (ESTRACE) 0.1 MG/GM vaginal cream SMARTSIG:0.5 Gram(s) Vaginal 12/25/20   [provider]  ferrous sulfate 325 (65 FE) MG tablet Take 1 tablet (325 mg total) by mouth 2 (two) times daily with a meal. 02/25/20   Arty Baumgartner, NP  folic acid (FOLVITE) 1 MG tablet Take 1 tablet by mouth once daily 04/26/22   Gearldine Bienenstock, PA-C  furosemide (LASIX) 20 MG tablet Take 1 tablet (20 mg total) by mouth daily as needed. 02/04/22   Gaston Islam., NP  gabapentin (NEURONTIN) 100 MG capsule TAKE 1 TO 2 CAPSULES BY MOUTH ONCE DAILY AT BEDTIME AS NEEDED 05/13/20   [provider]  insulin lispro (HUMALOG) 100 UNIT/ML KiwkPen Inject 6 Units into the skin 3 (three) times daily.     [provider]  LEVEMIR FLEXTOUCH 100 UNIT/ML Pen Inject 14 Units into the skin every morning.  08/25/16   [provider]  methotrexate (RHEUMATREX) 2.5 MG tablet TAKE 3 TABLETS BY MOUTH ONCE A  WEEK. CAUTION: CHEMOTHERAPY. PROTECT FROM LIGHT. 10/04/22   Pollyann Savoy, MD  metoprolol succinate (TOPROL-XL) 100 MG 24 hr tablet TAKE 1 TABLET BY MOUTH IN THE MORNING 02/03/21   Corky Crafts, MD  nitroGLYCERIN (NITROSTAT) 0.4 MG SL tablet DISSOLVE ONE TABLET UNDER THE TONGUE EVERY 5 MINUTES AS NEEDED FOR CHEST PAIN.  DO NOT EXCEED A TOTAL OF 3 DOSES IN 15 MINUTES 11/04/20   Corky Crafts, MD  Omega-3 Fatty Acids (FISH OIL PO) Take 1 capsule by mouth daily.     [provider]  ondansetron (ZOFRAN ODT) 4 MG disintegrating tablet Take 1 tablet (4 mg total) by mouth every 8 (eight) hours as needed for nausea or vomiting. 04/18/20   Joy, Shawn C,  PA-C  pantoprazole (PROTONIX) 40 MG tablet Take 1 tablet (40 mg total) by mouth daily after lunch. 10/27/19   Osvaldo Shipper, MD  predniSONE (DELTASONE) 1 MG tablet Take 3 tablets (3 mg total) by mouth daily. 07/11/22   Gearldine Bienenstock, PA-C  ramipril (ALTACE) 10 MG tablet Take 10 mg by mouth 2 (two) times daily.     [provider]      Allergies    Codeine, Glimepiride, Jardiance [empagliflozin], Metformin and related, and Penicillins    Review of Systems   Review of Systems  Skin:  Positive for wound.  All other systems reviewed and are negative.   Physical Exam Updated Vital Signs There were no vitals taken for this visit. Physical Exam Vitals and nursing note reviewed.  Constitutional:      General: She is not in acute distress.    Appearance: Normal appearance. She is well-developed. She is not ill-appearing, toxic-appearing or diaphoretic.  HENT:     Head: Normocephalic.     Comments: Left forehead wound    Right Ear: External ear normal.     Left Ear: External ear normal.     Nose: Nose normal.     Mouth/Throat:     Mouth: Mucous membranes are moist.  Eyes:     Extraocular Movements: Extraocular movements intact.     Conjunctiva/sclera: Conjunctivae normal.  Cardiovascular:     Rate and Rhythm: Normal rate and regular rhythm.  Pulmonary:     Effort: Pulmonary effort is normal. No respiratory distress.     Breath sounds: No wheezing or rales.  Chest:     Chest wall: No tenderness.  Abdominal:     General: There is no distension.     Palpations: Abdomen is soft.     Tenderness: There is no abdominal tenderness.  Musculoskeletal:        General: No swelling. Normal range of motion.     Cervical back: Normal range of motion and neck supple.     Right lower leg: No edema.     Left lower leg: No edema.  Skin:    General: Skin is warm and dry.     Coloration: Skin is not jaundiced or pale.  Neurological:     General: No focal deficit present.     Mental  Status: She is alert and oriented to person, place, and time.  Psychiatric:        Mood and Affect: Mood normal.        Behavior: Behavior normal.     ED Results / Procedures / Treatments   Labs (all labs ordered are listed, but only abnormal results are displayed) Labs Reviewed - No data to display  EKG None  Radiology No results  found.  Procedures Procedures  {Document cardiac monitor, telemetry assessment procedure when appropriate:1}  Medications Ordered in ED Medications - No data to display  ED Course/ Medical Decision Making/ A&P   {   Click here for ABCD2, HEART and other calculatorsREFRESH Note before signing :1}                          Medical Decision Making  This patient presents to the ED for concern of ***, this involves an extensive number of treatment options, and is a complaint that carries with it a high risk of complications and morbidity.  The differential diagnosis includes ***   Co morbidities that complicate the patient evaluation  ***   Additional history obtained:  Additional history obtained from *** External records from outside source obtained and reviewed including ***   Lab Tests:  I Ordered, and personally interpreted labs.  The pertinent results include:  ***   Imaging Studies ordered:  I ordered imaging studies including ***  I independently visualized and interpreted imaging which showed *** I agree with the radiologist interpretation   Cardiac Monitoring: / EKG:  The patient was maintained on a cardiac monitor.  I personally viewed and interpreted the cardiac monitored which showed an underlying rhythm of: ***   Consultations Obtained:  I requested consultation with the ***,  and discussed lab and imaging findings as well as pertinent plan - they recommend: ***   Problem List / ED Course / Critical interventions / Medication management  *** I ordered medication including ***  for ***  Reevaluation of the  patient after these medicines showed that the patient {resolved/improved/worsened:23923::"improved"} I have reviewed the patients home medicines and have made adjustments as needed   Social Determinants of Health:  ***   Test / Admission - Considered:  ***   {Document critical care time when appropriate:1} {Document review of labs and clinical decision tools ie heart score, Chads2Vasc2 etc:1}  {Document your independent review of radiology images, and any outside records:1} {Document your discussion with family members, caretakers, and with consultants:1} {Document social determinants of health affecting pt's care:1} {Document your decision making why or why not admission, treatments were needed:1} Final Clinical Impression(s) / ED Diagnoses Final diagnoses:  None    Rx / DC Orders ED Discharge Orders     None

## 2022-10-31 NOTE — Progress Notes (Signed)
Orthopedic Tech Progress Note Patient Details:  Sophia Ward 06-26-1932 [id number]  Level2 trauma   Patient ID: [name], female   DOB: 09-20-1932, 89 y.o.   MRN: [id number]  Donald Pore 10/31/2022, 6:02 PM

## 2022-11-01 ENCOUNTER — Encounter (HOSPITAL_COMMUNITY): Payer: Self-pay | Admitting: Family Medicine

## 2022-11-01 ENCOUNTER — Observation Stay (HOSPITAL_COMMUNITY): Payer: Medicare Other

## 2022-11-01 DIAGNOSIS — I480 Paroxysmal atrial fibrillation: Secondary | ICD-10-CM | POA: Diagnosis present

## 2022-11-01 DIAGNOSIS — R0902 Hypoxemia: Secondary | ICD-10-CM | POA: Diagnosis present

## 2022-11-01 DIAGNOSIS — I509 Heart failure, unspecified: Secondary | ICD-10-CM | POA: Insufficient documentation

## 2022-11-01 DIAGNOSIS — I10 Essential (primary) hypertension: Secondary | ICD-10-CM

## 2022-11-01 DIAGNOSIS — J9601 Acute respiratory failure with hypoxia: Secondary | ICD-10-CM | POA: Diagnosis not present

## 2022-11-01 DIAGNOSIS — Z794 Long term (current) use of insulin: Secondary | ICD-10-CM | POA: Diagnosis not present

## 2022-11-01 DIAGNOSIS — S0181XA Laceration without foreign body of other part of head, initial encounter: Secondary | ICD-10-CM | POA: Diagnosis present

## 2022-11-01 DIAGNOSIS — B952 Enterococcus as the cause of diseases classified elsewhere: Secondary | ICD-10-CM | POA: Diagnosis present

## 2022-11-01 DIAGNOSIS — I2722 Pulmonary hypertension due to left heart disease: Secondary | ICD-10-CM | POA: Diagnosis present

## 2022-11-01 DIAGNOSIS — I5033 Acute on chronic diastolic (congestive) heart failure: Secondary | ICD-10-CM | POA: Diagnosis not present

## 2022-11-01 DIAGNOSIS — K219 Gastro-esophageal reflux disease without esophagitis: Secondary | ICD-10-CM | POA: Diagnosis present

## 2022-11-01 DIAGNOSIS — E119 Type 2 diabetes mellitus without complications: Secondary | ICD-10-CM | POA: Diagnosis not present

## 2022-11-01 DIAGNOSIS — E876 Hypokalemia: Secondary | ICD-10-CM | POA: Diagnosis present

## 2022-11-01 DIAGNOSIS — I272 Pulmonary hypertension, unspecified: Secondary | ICD-10-CM | POA: Diagnosis not present

## 2022-11-01 DIAGNOSIS — M0579 Rheumatoid arthritis with rheumatoid factor of multiple sites without organ or systems involvement: Secondary | ICD-10-CM | POA: Diagnosis present

## 2022-11-01 DIAGNOSIS — Z79899 Other long term (current) drug therapy: Secondary | ICD-10-CM | POA: Diagnosis not present

## 2022-11-01 DIAGNOSIS — I2510 Atherosclerotic heart disease of native coronary artery without angina pectoris: Secondary | ICD-10-CM

## 2022-11-01 DIAGNOSIS — E1122 Type 2 diabetes mellitus with diabetic chronic kidney disease: Secondary | ICD-10-CM | POA: Diagnosis present

## 2022-11-01 DIAGNOSIS — Y92009 Unspecified place in unspecified non-institutional (private) residence as the place of occurrence of the external cause: Secondary | ICD-10-CM | POA: Diagnosis not present

## 2022-11-01 DIAGNOSIS — Z885 Allergy status to narcotic agent status: Secondary | ICD-10-CM | POA: Diagnosis not present

## 2022-11-01 DIAGNOSIS — Z23 Encounter for immunization: Secondary | ICD-10-CM | POA: Diagnosis not present

## 2022-11-01 DIAGNOSIS — E782 Mixed hyperlipidemia: Secondary | ICD-10-CM | POA: Diagnosis present

## 2022-11-01 DIAGNOSIS — N1831 Chronic kidney disease, stage 3a: Secondary | ICD-10-CM | POA: Diagnosis present

## 2022-11-01 DIAGNOSIS — W07XXXA Fall from chair, initial encounter: Secondary | ICD-10-CM | POA: Diagnosis not present

## 2022-11-01 DIAGNOSIS — N390 Urinary tract infection, site not specified: Secondary | ICD-10-CM | POA: Diagnosis present

## 2022-11-01 DIAGNOSIS — Z888 Allergy status to other drugs, medicaments and biological substances status: Secondary | ICD-10-CM | POA: Diagnosis not present

## 2022-11-01 DIAGNOSIS — I252 Old myocardial infarction: Secondary | ICD-10-CM | POA: Diagnosis not present

## 2022-11-01 DIAGNOSIS — I130 Hypertensive heart and chronic kidney disease with heart failure and stage 1 through stage 4 chronic kidney disease, or unspecified chronic kidney disease: Secondary | ICD-10-CM | POA: Diagnosis present

## 2022-11-01 DIAGNOSIS — Z953 Presence of xenogenic heart valve: Secondary | ICD-10-CM | POA: Diagnosis not present

## 2022-11-01 DIAGNOSIS — Z880 Allergy status to penicillin: Secondary | ICD-10-CM | POA: Diagnosis not present

## 2022-11-01 LAB — CBC
HCT: 40.8 % (ref 36.0–46.0)
Hemoglobin: 13.7 g/dL (ref 12.0–15.0)
MCH: 31 pg (ref 26.0–34.0)
MCHC: 33.6 g/dL (ref 30.0–36.0)
MCV: 92.3 fL (ref 80.0–100.0)
Platelets: 397 10*3/uL (ref 150–400)
RBC: 4.42 MIL/uL (ref 3.87–5.11)
RDW: 14.5 % (ref 11.5–15.5)
WBC: 13.4 10*3/uL — ABNORMAL HIGH (ref 4.0–10.5)
nRBC: 0 % (ref 0.0–0.2)

## 2022-11-01 LAB — BASIC METABOLIC PANEL
Anion gap: 16 — ABNORMAL HIGH (ref 5–15)
BUN: 18 mg/dL (ref 8–23)
CO2: 21 mmol/L — ABNORMAL LOW (ref 22–32)
Calcium: 8.9 mg/dL (ref 8.9–10.3)
Chloride: 100 mmol/L (ref 98–111)
Creatinine, Ser: 1.15 mg/dL — ABNORMAL HIGH (ref 0.44–1.00)
GFR, Estimated: 46 mL/min — ABNORMAL LOW (ref >60)
Glucose, Bld: 101 mg/dL — ABNORMAL HIGH (ref 70–99)
Potassium: 3.9 mmol/L (ref 3.5–5.1)
Sodium: 137 mmol/L (ref 135–145)

## 2022-11-01 LAB — ECHOCARDIOGRAM COMPLETE
AR max vel: 0.87 cm2
AV Area VTI: 0.98 cm2
AV Area mean vel: 0.89 cm2
AV Mean grad: 23.3 mmHg
AV Peak grad: 41.9 mmHg
Ao pk vel: 3.24 m/s
Area-P 1/2: 3.08 cm2
Calc EF: 61 %
Height: 62 in
MV VTI: 1.27 cm2
S' Lateral: 3 cm
Single Plane A2C EF: 63.5 %
Single Plane A4C EF: 59.7 %
Weight: 2848 oz

## 2022-11-01 LAB — GLUCOSE, CAPILLARY: Glucose-Capillary: 241 mg/dL — ABNORMAL HIGH (ref 70–99)

## 2022-11-01 LAB — TROPONIN I (HIGH SENSITIVITY): Troponin I (High Sensitivity): 11 ng/L (ref <18)

## 2022-11-01 LAB — MAGNESIUM: Magnesium: 1.6 mg/dL — ABNORMAL LOW (ref 1.7–2.4)

## 2022-11-01 LAB — CBG MONITORING, ED
Glucose-Capillary: 94 mg/dL (ref 70–99)
Glucose-Capillary: 127 mg/dL — ABNORMAL HIGH (ref 70–99)
Glucose-Capillary: 138 mg/dL — ABNORMAL HIGH (ref 70–99)
Glucose-Capillary: 186 mg/dL — ABNORMAL HIGH (ref 70–99)

## 2022-11-01 MED ORDER — FUROSEMIDE 10 MG/ML IJ SOLN
40.0000 mg | Freq: Two times a day (BID) | INTRAMUSCULAR | Status: DC
  Administered 2022-11-01: 40 mg via INTRAVENOUS
  Filled 2022-11-01: qty 4

## 2022-11-01 MED ORDER — PREDNISONE 1 MG PO TABS
3.0000 mg | ORAL_TABLET | Freq: Every day | ORAL | Status: DC
  Administered 2022-11-01 – 2022-11-04 (×4): 3 mg via ORAL
  Filled 2022-11-01 (×4): qty 3

## 2022-11-01 MED ORDER — PANTOPRAZOLE SODIUM 40 MG PO TBEC
40.0000 mg | DELAYED_RELEASE_TABLET | Freq: Every day | ORAL | Status: DC
  Administered 2022-11-01 – 2022-11-04 (×4): 40 mg via ORAL
  Filled 2022-11-01 (×4): qty 1

## 2022-11-01 MED ORDER — FUROSEMIDE 10 MG/ML IJ SOLN
60.0000 mg | Freq: Two times a day (BID) | INTRAMUSCULAR | Status: DC
  Administered 2022-11-02: 60 mg via INTRAVENOUS
  Filled 2022-11-01 (×2): qty 6

## 2022-11-01 MED ORDER — ACETAMINOPHEN 650 MG RE SUPP: 650.0000 mg | Freq: Four times a day (QID) | RECTAL | Status: DC | PRN

## 2022-11-01 MED ORDER — SPIRONOLACTONE 12.5 MG HALF TABLET
12.5000 mg | ORAL_TABLET | Freq: Every day | ORAL | Status: DC
  Administered 2022-11-01 – 2022-11-02 (×2): 12.5 mg via ORAL
  Filled 2022-11-01 (×2): qty 1

## 2022-11-01 MED ORDER — AMLODIPINE BESYLATE 5 MG PO TABS
10.0000 mg | ORAL_TABLET | Freq: Every morning | ORAL | Status: DC
  Administered 2022-11-01: 10 mg via ORAL
  Filled 2022-11-01: qty 2

## 2022-11-01 MED ORDER — EMPAGLIFLOZIN 10 MG PO TABS
10.0000 mg | ORAL_TABLET | Freq: Every day | ORAL | Status: DC
  Administered 2022-11-02: 10 mg via ORAL
  Filled 2022-11-01: qty 1

## 2022-11-01 MED ORDER — LOSARTAN POTASSIUM 25 MG PO TABS
25.0000 mg | ORAL_TABLET | Freq: Every day | ORAL | Status: DC
  Administered 2022-11-02 – 2022-11-04 (×3): 25 mg via ORAL
  Filled 2022-11-01 (×3): qty 1

## 2022-11-01 MED ORDER — INSULIN ASPART 100 UNIT/ML IJ SOLN
0.0000 [IU] | Freq: Three times a day (TID) | INTRAMUSCULAR | Status: DC
  Administered 2022-11-02: 3 [IU] via SUBCUTANEOUS
  Administered 2022-11-03: 2 [IU] via SUBCUTANEOUS

## 2022-11-01 MED ORDER — APIXABAN 5 MG PO TABS
5.0000 mg | ORAL_TABLET | Freq: Two times a day (BID) | ORAL | Status: DC
  Administered 2022-11-01 – 2022-11-04 (×8): 5 mg via ORAL
  Filled 2022-11-01 (×8): qty 1

## 2022-11-01 MED ORDER — ESCITALOPRAM OXALATE 10 MG PO TABS
10.0000 mg | ORAL_TABLET | Freq: Every day | ORAL | Status: DC
  Administered 2022-11-01 – 2022-11-04 (×4): 10 mg via ORAL
  Filled 2022-11-01 (×4): qty 1

## 2022-11-01 MED ORDER — ONDANSETRON HCL 4 MG PO TABS: 4.0000 mg | ORAL_TABLET | Freq: Four times a day (QID) | ORAL | Status: DC | PRN

## 2022-11-01 MED ORDER — CLOPIDOGREL BISULFATE 75 MG PO TABS
75.0000 mg | ORAL_TABLET | Freq: Every day | ORAL | Status: DC
  Administered 2022-11-01 – 2022-11-04 (×4): 75 mg via ORAL
  Filled 2022-11-01 (×4): qty 1

## 2022-11-01 MED ORDER — INSULIN ASPART 100 UNIT/ML IJ SOLN
0.0000 [IU] | Freq: Every day | INTRAMUSCULAR | Status: DC
  Administered 2022-11-01 – 2022-11-03 (×2): 2 [IU] via SUBCUTANEOUS

## 2022-11-01 MED ORDER — SODIUM CHLORIDE 0.9% FLUSH
3.0000 mL | Freq: Two times a day (BID) | INTRAVENOUS | Status: DC
  Administered 2022-11-01 – 2022-11-03 (×6): 3 mL via INTRAVENOUS

## 2022-11-01 MED ORDER — SODIUM CHLORIDE 0.9 % IV SOLN
1.0000 g | Freq: Once | INTRAVENOUS | Status: AC
  Administered 2022-11-01: 1 g via INTRAVENOUS
  Filled 2022-11-01: qty 10

## 2022-11-01 MED ORDER — ATORVASTATIN CALCIUM 40 MG PO TABS
40.0000 mg | ORAL_TABLET | Freq: Every day | ORAL | Status: DC
  Administered 2022-11-01 – 2022-11-03 (×3): 40 mg via ORAL
  Filled 2022-11-01 (×3): qty 1

## 2022-11-01 MED ORDER — METOPROLOL SUCCINATE ER 100 MG PO TB24
100.0000 mg | ORAL_TABLET | Freq: Every morning | ORAL | Status: DC
  Administered 2022-11-01 – 2022-11-04 (×4): 100 mg via ORAL
  Filled 2022-11-01 (×2): qty 1
  Filled 2022-11-01: qty 4
  Filled 2022-11-01: qty 1

## 2022-11-01 MED ORDER — FUROSEMIDE 10 MG/ML IJ SOLN
40.0000 mg | Freq: Once | INTRAMUSCULAR | Status: AC
  Administered 2022-11-01: 40 mg via INTRAVENOUS
  Filled 2022-11-01: qty 4

## 2022-11-01 MED ORDER — MAGNESIUM SULFATE 4 GM/100ML IV SOLN
4.0000 g | Freq: Once | INTRAVENOUS | Status: AC
  Administered 2022-11-01: 4 g via INTRAVENOUS
  Filled 2022-11-01: qty 100

## 2022-11-01 MED ORDER — ONDANSETRON HCL 4 MG/2ML IJ SOLN: 4.0000 mg | Freq: Four times a day (QID) | INTRAMUSCULAR | Status: DC | PRN

## 2022-11-01 MED ORDER — ACETAMINOPHEN 325 MG PO TABS: 650.0000 mg | ORAL_TABLET | Freq: Four times a day (QID) | ORAL | Status: DC | PRN

## 2022-11-01 NOTE — ED Notes (Signed)
Pre- Lasix, ambulated patient with no oxygen. Primarily stayed at 90% room air but did go down to 82% at the end of the stroll. Will let provider know, and will administer Lasix.

## 2022-11-01 NOTE — Assessment & Plan Note (Signed)
Hypomagnesemia.   Renal function with serum cr at 1.15 with K 3,9 and Mg 1,6  Plan to continue diuresis with IV furosemide, add 4 g mag sulfate. Follow up renal function and electrolytes in am.

## 2022-11-01 NOTE — Progress Notes (Addendum)
Progress Note   Patient: Sophia Ward:[id number] DOB: 12-25-1932 DOA: 10/31/2022     0 DOS: the patient was seen and examined on 11/01/2022   Brief hospital course: Mrs. Vossler was admitted to the hospital with the working diagnosis of heart failure exacerbation.   89 yo female with the past medical history of heart failure, atrial fibrillation, hypertension, AS sp TAVR, hyperlipidemia and T2DM who presented after a mechanical fall. Patient fall from her recliner while leaning forward trying to adjust the chair. She had head trauma with laceration over her left eye. No loss of consciousness. Endorsed worsening dyspnea, and edema over last 4 weeks. Positive weight gain. Her symptoms improved transitory with oral furosemide. On her initial physical examination her 02 saturation was 80's on ambulation, blood pressure 120/54, HR 58, RR 23, and 02 saturation 94%, lungs with no wheezing or rales, heart with S1 and S2 present and regular, abdomen with no distention and positive lower extremity edema.  Chest radiograph with cardiomegaly with bilateral hilar vascular congestion and bilateral interstitial infiltrates. Positive fluid in the right fissure.       Assessment and Plan: * Acute on chronic diastolic CHF (congestive heart failure) (HCC) Echocardiogram with preserved LV systolic function with EF 60 to 65%, no LVH, RV with preserved systolic function. RVPS 62.5 mmHg, no atrial dilatation, mild to moderate TR.   Patient continue volume overloaded.  Systolic blood pressure 137 to 117 mmHg.   Plan to continue furosemide 60 mg IV q12 hrs.  Continue metoprolol, add low dose losartan.  Add SGLT 2 inh (patient had yeast infection in the past, but risk vs benefit will try again) and spironolactone.   Acute hypoxemic respiratory failure due to acute cardiogenic pulmonary edema, plan to continue diuresis, oxymetry monitoring and supplemental 02 per [place] Keep 02 saturation 92% or greater.    Essential hypertension, benign Continue blood pressure control with metoprolol Will change amlodipine for losartan.   PAF (paroxysmal atrial fibrillation) (HCC) Continue rate control with metoprolol and anticoagulation with apixaban. Continue telemetry monitoring.   CKD stage 3a, GFR 45-59 ml/min (HCC) Hypomagnesemia.   Renal function with serum cr at 1.15 with K 3,9 and Mg 1,6  Plan to continue diuresis with IV furosemide, add 4 g mag sulfate. Follow up renal function and electrolytes in am.   Coronary atherosclerosis of native coronary artery No chest pain.   Rheumatoid arthritis involving multiple sites with positive rheumatoid factor (HCC) Continue Pt and Ot  Continue low dose of home prednisone.   Diabetes mellitus without complication (HCC) Insulin sliding scale for glucose cover and monitoring         Subjective: Patient continue to have dyspnea and edema no chest pain,   Physical Exam: Vitals:   11/01/22 1200 11/01/22 1300 11/01/22 1400 11/01/22 1448  BP: (!) 122/56 (!) 137/58 (!) 117/53   Pulse: (!) 58 66 66   Resp: (!) 22 (!) 23 (!) 23   Temp:    98.5 F (36.9 C)  TempSrc:      SpO2: 95% 94% 92%   Weight:      Height:       Neurology awake and alert ENT with mild pallor with positive fascial ecchymosis Cardiovascular with S1 and S2 present, irregularly irregular with no gallops, positive systolic murmur at the apex Positive JVD Positive lower extremity edema ++  Respiratory with rales bilaterally, with no wheezing Abdomen with no distention  Data Reviewed:    Family Communication: I spoke with  patient's son at the bedside, we talked in detail about patient's condition, plan of care and prognosis and all questions were addressed.   Disposition: Status is: Inpatient Remains inpatient appropriate because: heart failure need IV diuresis   Planned Discharge Destination: Home    Author: Coralie Keens, MD 11/01/2022 4:12 PM  For on  call review [email or web address].

## 2022-11-01 NOTE — TOC CAGE-AID Note (Signed)
Transition of Care [facility]) - CAGE-AID Screening   Patient Details  Name: SOLIANA VERDUIN MRN: [id number] Date of Birth: 1932/09/12  [facility] of [facility]) CM/SW Contact:    Leota Sauers, RN Phone Number: 11/01/2022, 7:42 PM   Clinical Narrative:  Patient denies use of alcohol and illicit drug use. Education not offered at this time.  CAGE-AID Screening:    Have You Ever Felt You Ought to Cut Down on Your Drinking or Drug Use?: No Have People Annoyed You By Critizing Your Drinking Or Drug Use?: No Have You Felt Bad Or Guilty About Your Drinking Or Drug Use?: No Have You Ever Had a Drink or Used Drugs First Thing In The Morning to Steady Your Nerves or to Get Rid of a Hangover?: No CAGE-AID Score: 0  Substance Abuse Education Offered: No

## 2022-11-01 NOTE — Progress Notes (Signed)
PROGRESS NOTE    Sophia Ward  WGN:[id number] DOB: 1932/09/02 DOA: 10/31/2022 PCP: Georgann Housekeeper, MD  89/F w diastolic CHF, P.Afib, hypertension, AS sp TAVR, hyperlipidemia and T2DM who presented after a mechanical fall.> head trauma with laceration over her left eye. No loss of consciousness. reported worsening dyspnea, and edema over last 4 weeks. In ED, sats 80's on ambulation, positive lower extremity edema. CXR noted cardiomegaly with bilateral hilar vascular congestion and bilateral interstitial infiltrates.   Subjective: -Feels better overall, breathing is improving  Assessment and Plan:  Acute on chronic diastolic CHF Severe Pulm HTN -Echo with EF 60 to 65%, grade 2 diastolic dysfunction, preserved RV, severely elevated PA systolic pressures,  moderate TR.  -Urine output is inaccurate, weight down 3 LB, clinically improving, continue IV Lasix 1 more day  -Continue metoprolol, increase Aldactone to 25 Mg -Will DC Jardiance, urine culture growing E fecalis -Will request cards input regarding pulmonary hypertension, no known history of chronic lung disease, but has RA  Acute hypoxemic respiratory failure  -due to acute cardiogenic pulmonary edema -as above, wean O2 as tolerated  Asymptomatic bacteriuria -UA and culture with E faecalis, she is asymptomatic, but did have mild leukocytosis on admission, will repeat CBC -Monitor without ABX  Essential hypertension, benign Continue metoprolol -changed amlodipine for losartan.   PAF (paroxysmal atrial fibrillation) (HCC) Continue metoprolol and apixaban.  CKD stage 3a, GFR 45-59 ml/min (HCC) Hypomagnesemia.  -stable replaced mag  Coronary atherosclerosis of native coronary artery No chest pain.   Rheumatoid arthritis involving multiple sites with positive rheumatoid factor (HCC) Continue low dose of home prednisone.   Diabetes mellitus without complication (HCC) -SSI  Hypokalemia -Replaced  DVT prophylaxis:  apixaban Code Status: Full Code Family Communication: None present Disposition Plan: Pending cards  Consultants:    Procedures:   Antimicrobials:    Objective: Vitals:   11/01/22 1300 11/01/22 1400 11/01/22 1448 11/01/22 1630  BP: (!) 137/58 (!) 117/53  (!) 135/54  Pulse: 66 66  60  Resp: (!) 23 (!) 23  (!) 23  Temp:   98.5 F (36.9 C)   TempSrc:      SpO2: 94% 92%  95%  Weight:      Height:       No intake or output data in the 24 hours ending 11/01/22 1736 Filed Weights   10/31/22 1708  Weight: 80.7 kg    Examination:  General exam: Appears calm and comfortable  Respiratory system: Clear to auscultation Cardiovascular system: S1 & S2 heard, RRR.  Abd: nondistended, soft and nontender.Normal bowel sounds heard. Central nervous system: Alert and oriented. No focal neurological deficits. Extremities: no edema Skin: No rashes Psychiatry:  Mood & affect appropriate.     Data Reviewed:   CBC: Recent Labs  Lab 10/31/22 1717 10/31/22 1725 11/01/22 0239  WBC 12.3*  --  13.4*  HGB 12.7 13.6 13.7  HCT 38.7 40.0 40.8  MCV 92.6  --  92.3  PLT 342  --  397   Basic Metabolic Panel: Recent Labs  Lab 10/31/22 1717 10/31/22 1725 11/01/22 0239  NA 134* 136 137  K 4.0 4.8 3.9  CL 103 102 100  CO2 23  --  21*  GLUCOSE 198* 194* 101*  BUN 20 27* 18  CREATININE 1.23* 1.20* 1.15*  CALCIUM 8.7*  --  8.9  MG  --   --  1.6*   GFR: Estimated Creatinine Clearance: 33.9 mL/min (A) (by C-G formula based on SCr of 1.15  mg/dL (H)). Liver Function Tests: Recent Labs  Lab 10/31/22 1717  AST 23  ALT 24  ALKPHOS 54  BILITOT 0.6  PROT 6.8  ALBUMIN 3.2*   No results for input(s): "LIPASE", "AMYLASE" in the last 168 hours. No results for input(s): "AMMONIA" in the last 168 hours. Coagulation Profile: Recent Labs  Lab 10/31/22 1717  INR 1.3*   Cardiac Enzymes: No results for input(s): "CKTOTAL", "CKMB", "CKMBINDEX", "TROPONINI" in the last 168 hours. BNP  (last 3 results) No results for input(s): "PROBNP" in the last 8760 hours. HbA1C: No results for input(s): "HGBA1C" in the last 72 hours. CBG: Recent Labs  Lab 11/01/22 0203 11/01/22 0754 11/01/22 1147 11/01/22 1653  GLUCAP 94 127* 138* 186*   Lipid Profile: No results for input(s): "CHOL", "HDL", "LDLCALC", "TRIG", "CHOLHDL", "LDLDIRECT" in the last 72 hours. Thyroid Function Tests: No results for input(s): "TSH", "T4TOTAL", "FREET4", "T3FREE", "THYROIDAB" in the last 72 hours. Anemia Panel: No results for input(s): "VITAMINB12", "FOLATE", "FERRITIN", "TIBC", "IRON", "RETICCTPCT" in the last 72 hours. Urine analysis:    Component Value Date/Time   COLORURINE YELLOW 10/31/2022 2129   APPEARANCEUR CLOUDY (A) 10/31/2022 2129   LABSPEC 1.008 10/31/2022 2129   PHURINE 5.0 10/31/2022 2129   GLUCOSEU NEGATIVE 10/31/2022 2129   HGBUR MODERATE (A) 10/31/2022 2129   BILIRUBINUR NEGATIVE 10/31/2022 2129   KETONESUR NEGATIVE 10/31/2022 2129   PROTEINUR NEGATIVE 10/31/2022 2129   NITRITE NEGATIVE 10/31/2022 2129   LEUKOCYTESUR LARGE (A) 10/31/2022 2129   Sepsis Labs: @LABRCNTIP (procalcitonin:4,lacticidven:4)  )No results found for this or any previous visit (from the past 240 hour(s)).   Radiology Studies: ECHOCARDIOGRAM COMPLETE  Result Date: 11/01/2022    ECHOCARDIOGRAM REPORT   Patient Name:   Sophia Ward Date of Exam: 11/01/2022 Medical Rec #:  [id number]          Height:       62.0 in Accession #:    [id number]         Weight:       178.0 lb Date of Birth:  1932/08/11         BSA:          1.819 m Patient Age:    89 years           BP:           99/80 mmHg Patient Gender: F                  HR:           61 bpm. Exam Location:  Inpatient Procedure: 2D Echo, Color Doppler and Cardiac Doppler Indications:    CHF  History:        Patient has prior history of Echocardiogram examinations, most                 recent 02/21/2022. CHF, CAD, Aortic Valve Disease,                  Arrythmias:Atrial Fibrillation; Risk Factors:Hypertension and                 Diabetes.  Sonographer:    Milbert Coulter Referring Phys: [id number] TIMOTHY S OPYD IMPRESSIONS  1. Left ventricular ejection fraction, by estimation, is 60 to 65%. The left ventricle has normal function. The left ventricle has no regional wall motion abnormalities. Left ventricular diastolic parameters are consistent with Grade II diastolic dysfunction (pseudonormalization).  2. Right ventricular systolic function is normal. The right ventricular size is normal.  There is severely elevated pulmonary artery systolic pressure. The estimated right ventricular systolic pressure is 62.5 mmHg.  3. The mitral valve is normal in structure. No evidence of mitral valve regurgitation. No evidence of mitral stenosis. Moderate mitral annular calcification.  4. Tricuspid valve regurgitation is mild to moderate.  5. The aortic valve is very poorly visualized, however, appears calcified. Gradients are consistent with mild-moderate stenosis.  6. The inferior vena cava is normal in size with greater than 50% respiratory variability, suggesting right atrial pressure of 3 mmHg.  7. Evidence of atrial level shunting detected by color flow Doppler. FINDINGS  Left Ventricle: Left ventricular ejection fraction, by estimation, is 60 to 65%. The left ventricle has normal function. The left ventricle has no regional wall motion abnormalities. The left ventricular internal cavity size was normal in size. There is  no left ventricular hypertrophy. Left ventricular diastolic parameters are consistent with Grade II diastolic dysfunction (pseudonormalization). Right Ventricle: The right ventricular size is normal. No increase in right ventricular wall thickness. Right ventricular systolic function is normal. There is severely elevated pulmonary artery systolic pressure. The tricuspid regurgitant velocity is 3.79 m/s, and with an assumed right atrial pressure of 5 mmHg, the  estimated right ventricular systolic pressure is 62.5 mmHg. Left Atrium: Left atrial size was normal in size. Right Atrium: Right atrial size was normal in size. Pericardium: There is no evidence of pericardial effusion. Mitral Valve: The mitral valve is normal in structure. Moderate mitral annular calcification. No evidence of mitral valve regurgitation. No evidence of mitral valve stenosis. MV peak gradient, 10.0 mmHg. The mean mitral valve gradient is 4.0 mmHg. Tricuspid Valve: The tricuspid valve is normal in structure. Tricuspid valve regurgitation is mild to moderate. No evidence of tricuspid stenosis. Aortic Valve: The aortic valve was not well visualized. Aortic valve regurgitation is not visualized. Aortic valve sclerosis/calcification is present, without any evidence of aortic stenosis. Aortic valve mean gradient measures 23.2 mmHg. Aortic valve peak gradient measures 41.9 mmHg. Aortic valve area, by VTI measures 0.98 cm. Pulmonic Valve: The pulmonic valve was normal in structure. Pulmonic valve regurgitation is mild. No evidence of pulmonic stenosis. Aorta: The aortic root is normal in size and structure. Venous: The inferior vena cava is normal in size with greater than 50% respiratory variability, suggesting right atrial pressure of 3 mmHg. IAS/Shunts: Evidence of atrial level shunting detected by color flow Doppler.  LEFT VENTRICLE PLAX 2D LVIDd:         4.30 cm     Diastology LVIDs:         3.00 cm     LV e' medial:    5.00 cm/s LV PW:         1.00 cm     LV E/e' medial:  31.4 LV IVS:        1.00 cm     LV e' lateral:   8.49 cm/s LVOT diam:     1.80 cm     LV E/e' lateral: 18.5 LV SV:         73 LV SV Index:   40 LVOT Area:     2.54 cm  LV Volumes (MOD) LV vol d, MOD A2C: 71.7 ml LV vol d, MOD A4C: 77.6 ml LV vol s, MOD A2C: 26.2 ml LV vol s, MOD A4C: 31.3 ml LV SV MOD A2C:     45.5 ml LV SV MOD A4C:     77.6 ml LV SV MOD BP:  46.6 ml RIGHT VENTRICLE             IVC RV Basal diam:  3.30 cm      IVC diam: 1.40 cm RV Mid diam:    3.40 cm RV S prime:     13.40 cm/s TAPSE (M-mode): 1.3 cm LEFT ATRIUM             Index        RIGHT ATRIUM           Index LA diam:        3.40 cm 1.87 cm/m   RA Area:     16.10 cm LA Vol (A2C):   62.0 ml 34.08 ml/m  RA Volume:   39.40 ml  21.66 ml/m LA Vol (A4C):   46.8 ml 25.72 ml/m LA Biplane Vol: 58.9 ml 32.37 ml/m  AORTIC VALVE AV Area (Vmax):    0.87 cm AV Area (Vmean):   0.89 cm AV Area (VTI):     0.98 cm AV Vmax:           323.75 cm/s AV Vmean:          222.500 cm/s AV VTI:            0.738 m AV Peak Grad:      41.9 mmHg AV Mean Grad:      23.2 mmHg LVOT Vmax:         111.00 cm/s LVOT Vmean:        77.600 cm/s LVOT VTI:          0.285 m LVOT/AV VTI ratio: 0.39  AORTA Ao Asc diam: 2.40 cm MITRAL VALVE                TRICUSPID VALVE MV Area (PHT): 3.08 cm     TR Peak grad:   57.5 mmHg MV Area VTI:   1.27 cm     TR Vmax:        379.00 cm/s MV Peak grad:  10.0 mmHg MV Mean grad:  4.0 mmHg     SHUNTS MV Vmax:       1.58 m/s     Systemic VTI:  0.29 m MV Vmean:      95.3 cm/s    Systemic Diam: 1.80 cm MV Decel Time: 246 msec MV E velocity: 157.00 cm/s MV A velocity: 99.70 cm/s MV E/A ratio:  1.57 Aditya Sabharwal Electronically signed by Dorthula Nettles Signature Date/Time: 11/01/2022/12:46:19 PM    Final    CT Angio Chest PE W and/or Wo Contrast  Result Date: 11/01/2022 CLINICAL DATA:  Pulmonary embolus suspected with high probability. EXAM: CT ANGIOGRAPHY CHEST WITH CONTRAST TECHNIQUE: Multidetector CT imaging of the chest was performed using the standard protocol during bolus administration of intravenous contrast. Multiplanar CT image reconstructions and MIPs were obtained to evaluate the vascular anatomy. RADIATION DOSE REDUCTION: This exam was performed according to the departmental dose-optimization program which includes automated exposure control, adjustment of the mA and/or kV according to patient size and/or use of iterative reconstruction technique.  CONTRAST:  75mL OMNIPAQUE IOHEXOL 350 MG/ML SOLN COMPARISON:  02/07/2020 FINDINGS: Cardiovascular: Technically adequate study with good opacification of the central and segmental pulmonary arteries. No focal filling defects. No evidence of significant pulmonary embolus. Mild cardiac enlargement. Aortic valve prosthesis. Calcification in the mitral valve annulus. Calcification of the aorta and coronary arteries. No aortic aneurysm. Mediastinum/Nodes: Small esophageal hiatal hernia. Esophagus is decompressed. Thyroid gland is unremarkable. Prominent mediastinal lymph nodes with left aortopulmonic  wall nodes measuring up to 1.2 cm short axis dimension. Lymph nodes are similar to prior study, likely reactive. Lungs/Pleura: Mosaic attenuation pattern to the lungs with scattered interstitial septal thickening and ground-glass infiltrates throughout. Changes are likely due to edema but could indicate multifocal or atypical pneumonia in the appropriate clinical setting. No pleural effusions. No pneumothorax. Upper Abdomen: No acute abnormalities demonstrated. Musculoskeletal: Degenerative changes in the spine. Review of the MIP images confirms the above findings. IMPRESSION: 1. No evidence of significant pulmonary embolus. 2. Diffuse interstitial and alveolar infiltrates throughout the lungs likely representing edema. Multifocal or atypical pneumonia could also have this appearance in the appropriate setting. 3. Small esophageal hiatal hernia. 4. Aortic atherosclerosis. Electronically Signed   By: Burman Nieves M.D.   On: 11/01/2022 00:24   CT CERVICAL SPINE WO CONTRAST  Result Date: 10/31/2022 CLINICAL DATA:  Trauma EXAM: CT CERVICAL SPINE WITHOUT CONTRAST TECHNIQUE: Multidetector CT imaging of the cervical spine was performed without intravenous contrast. Multiplanar CT image reconstructions were also generated. RADIATION DOSE REDUCTION: This exam was performed according to the departmental dose-optimization program  which includes automated exposure control, adjustment of the mA and/or kV according to patient size and/or use of iterative reconstruction technique. COMPARISON:  Cervical spine CT 04/18/2020 FINDINGS: Alignment: Normal. Skull base and vertebrae: No acute fracture. No primary bone lesion or focal pathologic process. Soft tissues and spinal canal: No prevertebral fluid or swelling. No visible canal hematoma. Disc levels: There is moderate disc space narrowing and endplate osteophyte formation at C3-C4, C4-C5 and C5-C6 compatible with degenerative change. There is no significant central canal or neural foraminal stenosis at any level. There is mild central canal stenosis at C5-C6. Upper chest: Negative. Other: None. IMPRESSION: 1. No acute fracture or traumatic subluxation of the cervical spine. 2. Moderate degenerative changes of the cervical spine. Electronically Signed   By: Darliss Cheney M.D.   On: 10/31/2022 18:40   CT HEAD WO CONTRAST  Result Date: 10/31/2022 CLINICAL DATA:  Trauma EXAM: CT HEAD WITHOUT CONTRAST TECHNIQUE: Contiguous axial images were obtained from the base of the skull through the vertex without intravenous contrast. RADIATION DOSE REDUCTION: This exam was performed according to the departmental dose-optimization program which includes automated exposure control, adjustment of the mA and/or kV according to patient size and/or use of iterative reconstruction technique. COMPARISON:  Head CT 04/18/2020 FINDINGS: Brain: No evidence of acute infarction, hemorrhage, hydrocephalus, extra-axial collection or mass lesion/mass effect. There is an old lacunar infarct in the right basal ganglia, unchanged. Vascular: Atherosclerotic calcifications are present within the cavernous internal carotid arteries. Skull: Normal. Negative for fracture or focal lesion. Sinuses/Orbits: No acute finding. Other: None. IMPRESSION: No acute intracranial process. Electronically Signed   By: Darliss Cheney M.D.   On:  10/31/2022 18:37   DG Pelvis Portable  Result Date: 10/31/2022 CLINICAL DATA:  Fall on blood thinners EXAM: PORTABLE PELVIS 1 VIEWS COMPARISON:  None Available. FINDINGS: There is no evidence of pelvic fracture or diastasis. No pelvic bone lesions are seen. IMPRESSION: No acute fracture or dislocation. Electronically Signed   By: Agustin Cree M.D.   On: 10/31/2022 17:38   DG Chest Port 1 View  Result Date: 10/31/2022 CLINICAL DATA:  Fall on thinners EXAM: PORTABLE CHEST 1 VIEW COMPARISON:  Chest radiograph dated 02/28/2020 FINDINGS: Normal lung volumes. No focal consolidations. No pleural effusion or pneumothorax. Mildly enlarged cardiomediastinal silhouette status post aortic valve replacement. No radiographic finding of acute displaced fracture. IMPRESSION: 1. No radiographic finding of  acute displaced fracture. 2. Mildly enlarged cardiomediastinal silhouette status post aortic valve replacement. Electronically Signed   By: Agustin Cree M.D.   On: 10/31/2022 17:37     Scheduled Meds:  apixaban  5 mg Oral BID   atorvastatin  40 mg Oral QHS   clopidogrel  75 mg Oral Daily   empagliflozin  10 mg Oral Daily   escitalopram  10 mg Oral Daily   furosemide  60 mg Intravenous Q12H   insulin aspart  0-5 Units Subcutaneous QHS   insulin aspart  0-6 Units Subcutaneous TID WC   [START ON 11/02/2022] losartan  25 mg Oral Daily   metoprolol succinate  100 mg Oral q morning   pantoprazole  40 mg Oral Daily   predniSONE  3 mg Oral Q breakfast   sodium chloride flush  3 mL Intravenous Q12H   spironolactone  12.5 mg Oral Daily   Continuous Infusions:  magnesium sulfate bolus IVPB 4 g (11/01/22 1634)     LOS: 0 days    Time spent:    Zannie Cove, MD [facility]   11/01/2022, 5:36 PM

## 2022-11-01 NOTE — Discharge Instructions (Signed)
Sutures will need to be removed in 7-10 days.  Return the emergency department if area becomes increasingly swollen, red, painful, or begins draining pus.  Use incentive spirometer to maintain frequent deep breaths.  Follow-up with your primary care doctor.

## 2022-11-01 NOTE — ED Notes (Signed)
CBG 127 

## 2022-11-01 NOTE — Assessment & Plan Note (Signed)
Continue blood pressure control with metoprolol Will change amlodipine for losartan.

## 2022-11-01 NOTE — Assessment & Plan Note (Addendum)
Echocardiogram with preserved LV systolic function with EF 60 to 65%, no LVH, RV with preserved systolic function. RVPS 62.5 mmHg, no atrial dilatation, mild to moderate TR.   Patient continue volume overloaded.  Systolic blood pressure 137 to 117 mmHg.   Plan to continue furosemide 60 mg IV q12 hrs.  Continue metoprolol, add low dose losartan.  Add SGLT 2 inh (patient had yeast infection in the past, but risk vs benefit will try again) and spironolactone.   Acute hypoxemic respiratory failure due to acute cardiogenic pulmonary edema, plan to continue diuresis, oxymetry monitoring and supplemental 02 per [place] Keep 02 saturation 92% or greater.

## 2022-11-01 NOTE — Assessment & Plan Note (Addendum)
Continue Pt and Ot  Continue low dose of home prednisone.

## 2022-11-01 NOTE — ED Provider Notes (Signed)
Patient signed out pending CTA of the chest.  CTA does not show PE.  It does show some infiltrates which could be edema versus pneumonia.  Patient does report recent changes in her Lasix dosage and some decreased urinary output.  She has slightly increased lower extremity swelling.  This could be the culprit of her mild hypoxia.  Urinalysis also appears consistent with infection.  Reports frequent UTIs.  Has not had any frank dysuria.  Patient given IV Lasix.  Will ambulate with pulse ox.  If persistently hypoxic, will need admission for likely diuresis.  Urine culture was sent.  Patient desaturated.  Will admit.   Physical Exam  BP (!) 125/56   Pulse (!) 57   Temp 97.7 F (36.5 C) (Oral)   Resp 15   Ht 1.575 m (5\' 2" )   Wt 80.7 kg   SpO2 91%   BMI 32.56 kg/m   Physical Exam Awake, alert, no acute distress Trauma noted Nasal cannula in place, no significant respiratory distress 2+ edema bilateral lower extremities  Procedures  Procedures  ED Course / MDM    Medical Decision Making Amount and/or Complexity of Data Reviewed Labs: ordered. Radiology: ordered.  Risk OTC drugs. Prescription drug management. Decision regarding hospitalization.   Problem List Items Addressed This Visit       Cardiovascular and Mediastinum   Heart failure (HCC)   Relevant Medications   atorvastatin (LIPITOR) tablet 40 mg   metoprolol succinate (TOPROL-XL) 24 hr tablet 100 mg   apixaban (ELIQUIS) tablet 5 mg   furosemide (LASIX) injection 60 mg   spironolactone (ALDACTONE) tablet 12.5 mg   losartan (COZAAR) tablet 25 mg (Start on 11/02/2022 10:00 AM)   Other Visit Diagnoses     Fall, initial encounter    -  Primary   Laceration of forehead, initial encounter       Hypoxia       Urinary tract infection without hematuria, site unspecified       Relevant Medications   cefTRIAXone (ROCEPHIN) 1 g in sodium chloride 0.9 % 100 mL IVPB (Completed)             Alfredia Desanctis, Mayer Masker,  MD 11/02/22 [phone]

## 2022-11-01 NOTE — Assessment & Plan Note (Signed)
No chest pain

## 2022-11-01 NOTE — ED Notes (Signed)
ED TO INPATIENT HANDOFF REPORT  ED Nurse Name and Phone #: Josh  S Name/Age/Gender Sophia Ward 89 y.o. female Room/Bed: 004C/004C  Code Status   Code Status: Full Code  Home/SNF/Other Home Patient oriented to: self, place, time, and situation Is this baseline? Yes   Triage Complete: Triage complete  Chief Complaint Acute on chronic diastolic CHF (congestive heart failure) (HCC) [I50.33] Heart failure (HCC) [I50.9]  Triage Note See trauma notes please   Allergies Allergies  Allergen Reactions   Codeine Nausea And Vomiting    MAKES HER SICK   Glimepiride Other (See Comments)    Can't remember   Jardiance [Empagliflozin] Other (See Comments)    Yeast   Metformin And Related Diarrhea   Penicillins Rash    Level of Care/Admitting Diagnosis ED Disposition     ED Disposition  Admit   Condition  --   Comment  Hospital Area: [facility] [[id number]]  Level of Care: Telemetry Cardiac [[id number]]  May admit patient to [facility] or [facility] if equivalent level of care is available:: No  Covid Evaluation: Asymptomatic - no recent exposure (last 10 days) testing not required  Diagnosis: Heart failure [facility]) [[id number]]  Admitting Physician: Coralie Keens [[id number]]  Attending Physician: Coralie Keens [[id number]]  Certification:: I certify this patient will need inpatient services for at least 2 midnights  Estimated Length of Stay: 3          B Medical/Surgery History Past Medical History:  Diagnosis Date   Allergic rhinitis    Anemia 09/2019   Arthritis    hands   BPPV (benign paroxysmal positional vertigo)    Coronary artery disease    Diabetes mellitus without complication (HCC)    Essential hypertension, benign 01/30/2014   GERD (gastroesophageal reflux disease)    Hypertension    Lesion of pancreas    Mixed hyperlipidemia 01/30/2014   Myocardial infarction (HCC) 2004   mild MI-no damage- had stents   Osteopenia     Post-menopausal    Pulmonary nodules    Rheumatoid arthritis (HCC)    S/P TAVR (transcatheter aortic valve replacement) 03/03/2020   s/p TAVR with a 23 mm Edwards S3U via the TF approach by Drs Excell Seltzer and Cornelius Moras   Severe aortic stenosis    Spinal stenosis    Stenosing tenosynovitis    Past Surgical History:  Procedure Laterality Date   ABDOMINAL HYSTERECTOMY     APPENDECTOMY     BIOPSY  10/25/2019   Procedure: BIOPSY;  Surgeon: Kathi Der, MD;  Location: [facility] ENDOSCOPY;  Service: Gastroenterology;;   BIOPSY  10/26/2019   Procedure: BIOPSY;  Surgeon: Kathi Der, MD;  Location: [facility];  Service: Gastroenterology;;   CARDIAC CATHETERIZATION  2004   carpel tunnel     COLONOSCOPY WITH PROPOFOL N/A 10/30/2012   Procedure: COLONOSCOPY WITH PROPOFOL;  Surgeon: Charolett Bumpers, MD;  Location: [facility] ENDOSCOPY;  Service: Endoscopy;  Laterality: N/A;   COLONOSCOPY WITH PROPOFOL N/A 10/26/2019   Procedure: COLONOSCOPY WITH PROPOFOL;  Surgeon: Kathi Der, MD;  Location: [facility];  Service: Gastroenterology;  Laterality: N/A;   CORONARY ANGIOPLASTY  2004   CORONARY ATHERECTOMY N/A 02/21/2020   Procedure: CORONARY ATHERECTOMY;  Surgeon: Corky Crafts, MD;  Location: [facility] INVASIVE CV LAB;  Service: Cardiovascular;  Laterality: N/A;   CORONARY PRESSURE/FFR STUDY N/A 01/29/2020   Procedure: INTRAVASCULAR PRESSURE WIRE/FFR STUDY;  Surgeon: Corky Crafts, MD;  Location: [facility] INVASIVE CV LAB;  Service: Cardiovascular;  Laterality:  N/A;   CORONARY STENT INTERVENTION N/A 02/21/2020   Procedure: CORONARY STENT INTERVENTION;  Surgeon: Corky Crafts, MD;  Location: [facility] INVASIVE CV LAB;  Service: Cardiovascular;  Laterality: N/A;   CORONARY ULTRASOUND/IVUS N/A 02/21/2020   Procedure: Intravascular Ultrasound/IVUS;  Surgeon: Corky Crafts, MD;  Location: [facility] INVASIVE CV LAB;  Service: Cardiovascular;  Laterality: N/A;   DILATION AND CURETTAGE OF UTERUS      ESOPHAGOGASTRODUODENOSCOPY N/A 10/25/2019   Procedure: ESOPHAGOGASTRODUODENOSCOPY (EGD);  Surgeon: Kathi Der, MD;  Location: [facility] ENDOSCOPY;  Service: Gastroenterology;  Laterality: N/A;   ESOPHAGOGASTRODUODENOSCOPY (EGD) WITH PROPOFOL N/A 10/30/2012   Procedure: ESOPHAGOGASTRODUODENOSCOPY (EGD) WITH PROPOFOL;  Surgeon: Charolett Bumpers, MD;  Location: [facility];  Service: Endoscopy;  Laterality: N/A;   EYE SURGERY     bilateral cataract with lens implant   EYE SURGERY     INJECTION OF SILICONE OIL Left 02/12/2019   Procedure: Injection Of Silicone Oil;  Surgeon: Carmela Rima, MD;  Location: [facility] OR;  Service: Ophthalmology;  Laterality: Left;   left shouler surgery     PHOTOCOAGULATION WITH LASER Left 02/12/2019   Procedure: Photocoagulation With Laser;  Surgeon: Carmela Rima, MD;  Location: [facility] OR;  Service: Ophthalmology;  Laterality: Left;   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 02/12/2019   Procedure: REPAIR OF COMPLEX TRACTION RETINAL DETACHMENT;  Surgeon: Carmela Rima, MD;  Location: [facility] OR;  Service: Ophthalmology;  Laterality: Left;   RIGHT/LEFT HEART CATH AND CORONARY ANGIOGRAPHY N/A 01/29/2020   Procedure: RIGHT/LEFT HEART CATH AND CORONARY ANGIOGRAPHY;  Surgeon: Corky Crafts, MD;  Location: [facility] INVASIVE CV LAB;  Service: Cardiovascular;  Laterality: N/A;   TEE WITHOUT CARDIOVERSION N/A 03/03/2020   Procedure: TRANSESOPHAGEAL ECHOCARDIOGRAM (TEE);  Surgeon: Tonny Bollman, MD;  Location: [facility] OR;  Service: Open Heart Surgery;  Laterality: N/A;   TONSILLECTOMY     TRANSCATHETER AORTIC VALVE REPLACEMENT, TRANSFEMORAL  03/03/2020   TRANSCATHETER AORTIC VALVE REPLACEMENT, TRANSFEMORAL N/A 03/03/2020   Procedure: TRANSCATHETER AORTIC VALVE REPLACEMENT, TRANSFEMORAL USING EDWARDS SAPIEN 3 23 MM AORTIC VALVE.;  Surgeon: Tonny Bollman, MD;  Location: [facility] OR;  Service: Open Heart Surgery;  Laterality: N/A;   VITRECTOMY 25 GAUGE WITH SCLERAL BUCKLE Left 02/12/2019    Procedure: Vitrectomy 25 Gauge With Scleral Buckle;  Surgeon: Carmela Rima, MD;  Location: [facility] OR;  Service: Ophthalmology;  Laterality: Left;     A IV Location/Drains/Wounds Patient Lines/Drains/Airways Status     Active Line/Drains/Airways     Name Placement date Placement time Site Days   Peripheral IV 10/31/22 20 G Right Antecubital 10/31/22  1717  Antecubital  1   Incision (Closed) 02/12/19 Eye Left 02/12/19  1408  -- 1358   Incision (Closed) 03/03/20 Groin Right 03/03/20  0911  -- 973   Incision (Closed) 03/03/20 Groin Left 03/03/20  0911  -- 973   Pressure Injury 01/14/20 Buttocks Stage 1 -  Intact skin with non-blanchable redness of a localized area usually over a bony prominence. 01/14/20  2350  -- 1022            Intake/Output Last 24 hours No intake or output data in the 24 hours ending 11/01/22 1618  Labs/Imaging Results for orders placed or performed during the hospital encounter of 10/31/22 (from the past 48 hour(s))  Comprehensive metabolic panel     Status: Abnormal   Collection Time: 10/31/22  5:17 PM  Result Value Ref Range   Sodium 134 (L) 135 - 145 mmol/L   Potassium 4.0 3.5 - 5.1  mmol/L   Chloride 103 98 - 111 mmol/L   CO2 23 22 - 32 mmol/L   Glucose, Bld 198 (H) 70 - 99 mg/dL    Comment: Glucose reference range applies only to samples taken after fasting for at least 8 hours.   BUN 20 8 - 23 mg/dL   Creatinine, Ser 1.61 (H) 0.44 - 1.00 mg/dL   Calcium 8.7 (L) 8.9 - 10.3 mg/dL   Total Protein 6.8 6.5 - 8.1 g/dL   Albumin 3.2 (L) 3.5 - 5.0 g/dL   AST 23 15 - 41 U/L   ALT 24 0 - 44 U/L   Alkaline Phosphatase 54 38 - 126 U/L   Total Bilirubin 0.6 0.3 - 1.2 mg/dL   GFR, Estimated 43 (L) >60 mL/min    Comment: (NOTE) Calculated using the CKD-EPI Creatinine Equation (2021)    Anion gap 8 5 - 15    Comment: Performed at [facility] Lab, [address], [place], [place] [address]  CBC     Status: Abnormal   Collection Time: 10/31/22  5:17 PM   Result Value Ref Range   WBC 12.3 (H) 4.0 - 10.5 K/uL   RBC 4.18 3.87 - 5.11 MIL/uL   Hemoglobin 12.7 12.0 - 15.0 g/dL   HCT 54.0 98.1 - 19.1 %   MCV 92.6 80.0 - 100.0 fL   MCH 30.4 26.0 - 34.0 pg   MCHC 32.8 30.0 - 36.0 g/dL   RDW 47.8 29.5 - 62.1 %   Platelets 342 150 - 400 K/uL   nRBC 0.0 0.0 - 0.2 %    Comment: Performed at [facility] Lab, [address], [place], [place] [address]  Ethanol     Status: None   Collection Time: 10/31/22  5:17 PM  Result Value Ref Range   Alcohol, Ethyl (B) <10 <10 mg/dL    Comment: (NOTE) Lowest detectable limit for serum alcohol is 10 mg/dL.  For medical purposes only. Performed at [facility] Lab, [address], [place], [place] [address]   Protime-INR     Status: Abnormal   Collection Time: 10/31/22  5:17 PM  Result Value Ref Range   Prothrombin Time 16.6 (H) 11.4 - 15.2 seconds   INR 1.3 (H) 0.8 - 1.2    Comment: (NOTE) INR goal varies based on device and disease states. Performed at [facility], [address] [place], [place] [address]   Lactic acid, plasma     Status: None   Collection Time: 10/31/22  5:17 PM  Result Value Ref Range   Lactic Acid, Venous 1.8 0.5 - 1.9 mmol/L    Comment: Performed at [facility] Lab, [address] [place], [place] [address]  I-Stat Chem 8, ED     Status: Abnormal   Collection Time: 10/31/22  5:25 PM  Result Value Ref Range   Sodium 136 135 - 145 mmol/L   Potassium 4.8 3.5 - 5.1 mmol/L   Chloride 102 98 - 111 mmol/L   BUN 27 (H) 8 - 23 mg/dL   Creatinine, Ser 4.40 (H) 0.44 - 1.00 mg/dL   Glucose, Bld 102 (H) 70 - 99 mg/dL    Comment: Glucose reference range applies only to samples taken after fasting for at least 8 hours.   Calcium, Ion 1.10 (L) 1.15 - 1.40 mmol/L   TCO2 24 22 - 32 mmol/L   Hemoglobin 13.6 12.0 - 15.0 g/dL   HCT 72.5 36.6 - 44.0 %  Brain natriuretic peptide     Status: Abnormal   Collection Time: 10/31/22  9:15 PM  Result Value Ref Range   B  Natriuretic Peptide 309.9 (H) 0.0 - 100.0 pg/mL    Comment: Performed at [facility] Lab, [address], [place], [place] [address]  Troponin I (High Sensitivity)     Status: None   Collection Time: 10/31/22  9:15 PM  Result Value Ref Range   Troponin I (High Sensitivity) 10 <18 ng/L    Comment: (NOTE) Elevated high sensitivity troponin I (hsTnI) values and significant  changes across serial measurements may suggest ACS but many other  chronic and acute conditions are known to elevate hsTnI results.  Refer to the "Links" section for chest pain algorithms and additional  guidance. Performed at [facility], [address] [place], [place] [address]   Urinalysis, Routine w reflex microscopic -Urine, Clean Catch     Status: Abnormal   Collection Time: 10/31/22  9:29 PM  Result Value Ref Range   Color, Urine YELLOW YELLOW   APPearance CLOUDY (A) CLEAR   Specific Gravity, Urine 1.008 1.005 - 1.030   pH 5.0 5.0 - 8.0   Glucose, UA NEGATIVE NEGATIVE mg/dL   Hgb urine dipstick MODERATE (A) NEGATIVE   Bilirubin Urine NEGATIVE NEGATIVE   Ketones, ur NEGATIVE NEGATIVE mg/dL   Protein, ur NEGATIVE NEGATIVE mg/dL   Nitrite NEGATIVE NEGATIVE   Leukocytes,Ua LARGE (A) NEGATIVE   RBC / HPF 21-50 0 - 5 RBC/hpf   WBC, UA >50 0 - 5 WBC/hpf   Bacteria, UA RARE (A) NONE SEEN   Squamous Epithelial / HPF 0-5 0 - 5 /HPF   WBC Clumps PRESENT    Mucus PRESENT    Hyaline Casts, UA PRESENT     Comment: Performed at [facility] Lab, [address] [place], [place] [address]  Troponin I (High Sensitivity)     Status: None   Collection Time: 10/31/22 11:34 PM  Result Value Ref Range   Troponin I (High Sensitivity) 11 <18 ng/L    Comment: (NOTE) Elevated high sensitivity troponin I (hsTnI) values and significant  changes across serial measurements may suggest ACS but many other  chronic and acute conditions are known to elevate hsTnI results.  Refer to the "Links" section for chest pain  algorithms and additional  guidance. Performed at [facility] Lab, [address] [place], [place] [address]   CBG monitoring, ED     Status: None   Collection Time: 11/01/22  2:03 AM  Result Value Ref Range   Glucose-Capillary 94 70 - 99 mg/dL    Comment: Glucose reference range applies only to samples taken after fasting for at least 8 hours.  Basic metabolic panel     Status: Abnormal   Collection Time: 11/01/22  2:39 AM  Result Value Ref Range   Sodium 137 135 - 145 mmol/L   Potassium 3.9 3.5 - 5.1 mmol/L    Comment: HEMOLYSIS AT THIS LEVEL MAY AFFECT RESULT   Chloride 100 98 - 111 mmol/L   CO2 21 (L) 22 - 32 mmol/L   Glucose, Bld 101 (H) 70 - 99 mg/dL    Comment: Glucose reference range applies only to samples taken after fasting for at least 8 hours.   BUN 18 8 - 23 mg/dL   Creatinine, Ser 6.96 (H) 0.44 - 1.00 mg/dL   Calcium 8.9 8.9 - 29.5 mg/dL   GFR, Estimated 46 (L) >60 mL/min  Comment: (NOTE) Calculated using the CKD-EPI Creatinine Equation (2021)    Anion gap 16 (H) 5 - 15    Comment: Performed at [facility] Lab, [address], [place], [place] [address]  Magnesium     Status: Abnormal   Collection Time: 11/01/22  2:39 AM  Result Value Ref Range   Magnesium 1.6 (L) 1.7 - 2.4 mg/dL    Comment: Performed at [facility], [address] [place], [place] [address]  CBC     Status: Abnormal   Collection Time: 11/01/22  2:39 AM  Result Value Ref Range   WBC 13.4 (H) 4.0 - 10.5 K/uL   RBC 4.42 3.87 - 5.11 MIL/uL   Hemoglobin 13.7 12.0 - 15.0 g/dL   HCT 13.0 86.5 - 78.4 %   MCV 92.3 80.0 - 100.0 fL   MCH 31.0 26.0 - 34.0 pg   MCHC 33.6 30.0 - 36.0 g/dL   RDW 69.6 29.5 - 28.4 %   Platelets 397 150 - 400 K/uL   nRBC 0.0 0.0 - 0.2 %    Comment: Performed at [facility] Lab, [address], [place], [place] [address]  CBG monitoring, ED     Status: Abnormal   Collection Time: 11/01/22  7:54 AM  Result Value Ref Range   Glucose-Capillary 127 (H)  70 - 99 mg/dL    Comment: Glucose reference range applies only to samples taken after fasting for at least 8 hours.  CBG monitoring, ED     Status: Abnormal   Collection Time: 11/01/22 11:47 AM  Result Value Ref Range   Glucose-Capillary 138 (H) 70 - 99 mg/dL    Comment: Glucose reference range applies only to samples taken after fasting for at least 8 hours.   ECHOCARDIOGRAM COMPLETE  Result Date: 11/01/2022    ECHOCARDIOGRAM REPORT   Patient Name:   Sophia Ward Date of Exam: 11/01/2022 Medical Rec #:  [id number]          Height:       62.0 in Accession #:    [id number]         Weight:       178.0 lb Date of Birth:  [birth year]         BSA:          1.819 m Patient Age:    89 years           BP:           99/80 mmHg Patient Gender: F                  HR:           61 bpm. Exam Location:  Inpatient Procedure: 2D Echo, Color Doppler and Cardiac Doppler Indications:    CHF  History:        Patient has prior history of Echocardiogram examinations, most                 recent 02/21/2022. CHF, CAD, Aortic Valve Disease,                 Arrythmias:Atrial Fibrillation; Risk Factors:Hypertension and                 Diabetes.  Sonographer:    Milbert Coulter Referring Phys: [id number] TIMOTHY S OPYD IMPRESSIONS  1. Left ventricular ejection fraction, by estimation, is 60 to 65%. The left ventricle has normal function. The left ventricle has no regional wall motion abnormalities.  Left ventricular diastolic parameters are consistent with Grade II diastolic dysfunction (pseudonormalization).  2. Right ventricular systolic function is normal. The right ventricular size is normal. There is severely elevated pulmonary artery systolic pressure. The estimated right ventricular systolic pressure is 62.5 mmHg.  3. The mitral valve is normal in structure. No evidence of mitral valve regurgitation. No evidence of mitral stenosis. Moderate mitral annular calcification.  4. Tricuspid valve regurgitation is mild to moderate.  5. The  aortic valve is very poorly visualized, however, appears calcified. Gradients are consistent with mild-moderate stenosis.  6. The inferior vena cava is normal in size with greater than 50% respiratory variability, suggesting right atrial pressure of 3 mmHg.  7. Evidence of atrial level shunting detected by color flow Doppler. FINDINGS  Left Ventricle: Left ventricular ejection fraction, by estimation, is 60 to 65%. The left ventricle has normal function. The left ventricle has no regional wall motion abnormalities. The left ventricular internal cavity size was normal in size. There is  no left ventricular hypertrophy. Left ventricular diastolic parameters are consistent with Grade II diastolic dysfunction (pseudonormalization). Right Ventricle: The right ventricular size is normal. No increase in right ventricular wall thickness. Right ventricular systolic function is normal. There is severely elevated pulmonary artery systolic pressure. The tricuspid regurgitant velocity is 3.79 m/s, and with an assumed right atrial pressure of 5 mmHg, the estimated right ventricular systolic pressure is 62.5 mmHg. Left Atrium: Left atrial size was normal in size. Right Atrium: Right atrial size was normal in size. Pericardium: There is no evidence of pericardial effusion. Mitral Valve: The mitral valve is normal in structure. Moderate mitral annular calcification. No evidence of mitral valve regurgitation. No evidence of mitral valve stenosis. MV peak gradient, 10.0 mmHg. The mean mitral valve gradient is 4.0 mmHg. Tricuspid Valve: The tricuspid valve is normal in structure. Tricuspid valve regurgitation is mild to moderate. No evidence of tricuspid stenosis. Aortic Valve: The aortic valve was not well visualized. Aortic valve regurgitation is not visualized. Aortic valve sclerosis/calcification is present, without any evidence of aortic stenosis. Aortic valve mean gradient measures 23.2 mmHg. Aortic valve peak gradient measures  41.9 mmHg. Aortic valve area, by VTI measures 0.98 cm. Pulmonic Valve: The pulmonic valve was normal in structure. Pulmonic valve regurgitation is mild. No evidence of pulmonic stenosis. Aorta: The aortic root is normal in size and structure. Venous: The inferior vena cava is normal in size with greater than 50% respiratory variability, suggesting right atrial pressure of 3 mmHg. IAS/Shunts: Evidence of atrial level shunting detected by color flow Doppler.  LEFT VENTRICLE PLAX 2D LVIDd:         4.30 cm     Diastology LVIDs:         3.00 cm     LV e' medial:    5.00 cm/s LV PW:         1.00 cm     LV E/e' medial:  31.4 LV IVS:        1.00 cm     LV e' lateral:   8.49 cm/s LVOT diam:     1.80 cm     LV E/e' lateral: 18.5 LV SV:         73 LV SV Index:   40 LVOT Area:     2.54 cm  LV Volumes (MOD) LV vol d, MOD A2C: 71.7 ml LV vol d, MOD A4C: 77.6 ml LV vol s, MOD A2C: 26.2 ml LV vol s, MOD A4C: 31.3 ml LV  SV MOD A2C:     45.5 ml LV SV MOD A4C:     77.6 ml LV SV MOD BP:      46.6 ml RIGHT VENTRICLE             IVC RV Basal diam:  3.30 cm     IVC diam: 1.40 cm RV Mid diam:    3.40 cm RV S prime:     13.40 cm/s TAPSE (M-mode): 1.3 cm LEFT ATRIUM             Index        RIGHT ATRIUM           Index LA diam:        3.40 cm 1.87 cm/m   RA Area:     16.10 cm LA Vol (A2C):   62.0 ml 34.08 ml/m  RA Volume:   39.40 ml  21.66 ml/m LA Vol (A4C):   46.8 ml 25.72 ml/m LA Biplane Vol: 58.9 ml 32.37 ml/m  AORTIC VALVE AV Area (Vmax):    0.87 cm AV Area (Vmean):   0.89 cm AV Area (VTI):     0.98 cm AV Vmax:           323.75 cm/s AV Vmean:          222.500 cm/s AV VTI:            0.738 m AV Peak Grad:      41.9 mmHg AV Mean Grad:      23.2 mmHg LVOT Vmax:         111.00 cm/s LVOT Vmean:        77.600 cm/s LVOT VTI:          0.285 m LVOT/AV VTI ratio: 0.39  AORTA Ao Asc diam: 2.40 cm MITRAL VALVE                TRICUSPID VALVE MV Area (PHT): 3.08 cm     TR Peak grad:   57.5 mmHg MV Area VTI:   1.27 cm     TR Vmax:         379.00 cm/s MV Peak grad:  10.0 mmHg MV Mean grad:  4.0 mmHg     SHUNTS MV Vmax:       1.58 m/s     Systemic VTI:  0.29 m MV Vmean:      95.3 cm/s    Systemic Diam: 1.80 cm MV Decel Time: 246 msec MV E velocity: 157.00 cm/s MV A velocity: 99.70 cm/s MV E/A ratio:  1.57 Aditya Sabharwal Electronically signed by Dorthula Nettles Signature Date/Time: 11/01/2022/12:46:19 PM    Final    CT Angio Chest PE W and/or Wo Contrast  Result Date: 11/01/2022 CLINICAL DATA:  Pulmonary embolus suspected with high probability. EXAM: CT ANGIOGRAPHY CHEST WITH CONTRAST TECHNIQUE: Multidetector CT imaging of the chest was performed using the standard protocol during bolus administration of intravenous contrast. Multiplanar CT image reconstructions and MIPs were obtained to evaluate the vascular anatomy. RADIATION DOSE REDUCTION: This exam was performed according to the departmental dose-optimization program which includes automated exposure control, adjustment of the mA and/or kV according to patient size and/or use of iterative reconstruction technique. CONTRAST:  75mL OMNIPAQUE IOHEXOL 350 MG/ML SOLN COMPARISON:  02/07/2020 FINDINGS: Cardiovascular: Technically adequate study with good opacification of the central and segmental pulmonary arteries. No focal filling defects. No evidence of significant pulmonary embolus. Mild cardiac enlargement. Aortic valve prosthesis. Calcification in the mitral valve annulus. Calcification of  the aorta and coronary arteries. No aortic aneurysm. Mediastinum/Nodes: Small esophageal hiatal hernia. Esophagus is decompressed. Thyroid gland is unremarkable. Prominent mediastinal lymph nodes with left aortopulmonic wall nodes measuring up to 1.2 cm short axis dimension. Lymph nodes are similar to prior study, likely reactive. Lungs/Pleura: Mosaic attenuation pattern to the lungs with scattered interstitial septal thickening and ground-glass infiltrates throughout. Changes are likely due to edema but could  indicate multifocal or atypical pneumonia in the appropriate clinical setting. No pleural effusions. No pneumothorax. Upper Abdomen: No acute abnormalities demonstrated. Musculoskeletal: Degenerative changes in the spine. Review of the MIP images confirms the above findings. IMPRESSION: 1. No evidence of significant pulmonary embolus. 2. Diffuse interstitial and alveolar infiltrates throughout the lungs likely representing edema. Multifocal or atypical pneumonia could also have this appearance in the appropriate setting. 3. Small esophageal hiatal hernia. 4. Aortic atherosclerosis. Electronically Signed   By: Burman Nieves M.D.   On: 11/01/2022 00:24   CT CERVICAL SPINE WO CONTRAST  Result Date: 10/31/2022 CLINICAL DATA:  Trauma EXAM: CT CERVICAL SPINE WITHOUT CONTRAST TECHNIQUE: Multidetector CT imaging of the cervical spine was performed without intravenous contrast. Multiplanar CT image reconstructions were also generated. RADIATION DOSE REDUCTION: This exam was performed according to the departmental dose-optimization program which includes automated exposure control, adjustment of the mA and/or kV according to patient size and/or use of iterative reconstruction technique. COMPARISON:  Cervical spine CT 04/18/2020 FINDINGS: Alignment: Normal. Skull base and vertebrae: No acute fracture. No primary bone lesion or focal pathologic process. Soft tissues and spinal canal: No prevertebral fluid or swelling. No visible canal hematoma. Disc levels: There is moderate disc space narrowing and endplate osteophyte formation at C3-C4, C4-C5 and C5-C6 compatible with degenerative change. There is no significant central canal or neural foraminal stenosis at any level. There is mild central canal stenosis at C5-C6. Upper chest: Negative. Other: None. IMPRESSION: 1. No acute fracture or traumatic subluxation of the cervical spine. 2. Moderate degenerative changes of the cervical spine. Electronically Signed   By: Darliss Cheney M.D.   On: 10/31/2022 18:40   CT HEAD WO CONTRAST  Result Date: 10/31/2022 CLINICAL DATA:  Trauma EXAM: CT HEAD WITHOUT CONTRAST TECHNIQUE: Contiguous axial images were obtained from the base of the skull through the vertex without intravenous contrast. RADIATION DOSE REDUCTION: This exam was performed according to the departmental dose-optimization program which includes automated exposure control, adjustment of the mA and/or kV according to patient size and/or use of iterative reconstruction technique. COMPARISON:  Head CT 04/18/2020 FINDINGS: Brain: No evidence of acute infarction, hemorrhage, hydrocephalus, extra-axial collection or mass lesion/mass effect. There is an old lacunar infarct in the right basal ganglia, unchanged. Vascular: Atherosclerotic calcifications are present within the cavernous internal carotid arteries. Skull: Normal. Negative for fracture or focal lesion. Sinuses/Orbits: No acute finding. Other: None. IMPRESSION: No acute intracranial process. Electronically Signed   By: Darliss Cheney M.D.   On: 10/31/2022 18:37   DG Pelvis Portable  Result Date: 10/31/2022 CLINICAL DATA:  Fall on blood thinners EXAM: PORTABLE PELVIS 1 VIEWS COMPARISON:  None Available. FINDINGS: There is no evidence of pelvic fracture or diastasis. No pelvic bone lesions are seen. IMPRESSION: No acute fracture or dislocation. Electronically Signed   By: Agustin Cree M.D.   On: 10/31/2022 17:38   DG Chest Port 1 View  Result Date: 10/31/2022 CLINICAL DATA:  Fall on thinners EXAM: PORTABLE CHEST 1 VIEW COMPARISON:  Chest radiograph dated 02/28/2020 FINDINGS: Normal lung volumes. No focal consolidations.  No pleural effusion or pneumothorax. Mildly enlarged cardiomediastinal silhouette status post aortic valve replacement. No radiographic finding of acute displaced fracture. IMPRESSION: 1. No radiographic finding of acute displaced fracture. 2. Mildly enlarged cardiomediastinal silhouette status post aortic  valve replacement. Electronically Signed   By: Agustin Cree M.D.   On: 10/31/2022 17:37    Pending Labs Unresulted Labs (From admission, onward)     Start     Ordered   11/02/22 0500  Basic metabolic panel  Tomorrow morning,   R        11/01/22 1611   11/02/22 0500  Magnesium  Tomorrow morning,   R        11/01/22 1611   11/01/22 0500  Hemoglobin A1c  Tomorrow morning,   R       Comments: To assess prior glycemic control    11/01/22 0140   11/01/22 0104  Urine Culture  Once,   URGENT       Question:  Indication  Answer:  Dysuria   11/01/22 0103            Vitals/Pain Today's Vitals   11/01/22 1200 11/01/22 1300 11/01/22 1400 11/01/22 1448  BP: (!) 122/56 (!) 137/58 (!) 117/53   Pulse: (!) 58 66 66   Resp: (!) 22 (!) 23 (!) 23   Temp:    98.5 F (36.9 C)  TempSrc:      SpO2: 95% 94% 92%   Weight:      Height:      PainSc:        Isolation Precautions No active isolations  Medications Medications  atorvastatin (LIPITOR) tablet 40 mg (has no administration in time range)  metoprolol succinate (TOPROL-XL) 24 hr tablet 100 mg (100 mg Oral Given 11/01/22 0956)  escitalopram (LEXAPRO) tablet 10 mg (10 mg Oral Given 11/01/22 0956)  predniSONE (DELTASONE) tablet 3 mg (3 mg Oral Given 11/01/22 0955)  pantoprazole (PROTONIX) EC tablet 40 mg (40 mg Oral Given 11/01/22 0956)  apixaban (ELIQUIS) tablet 5 mg (5 mg Oral Given 11/01/22 0955)  clopidogrel (PLAVIX) tablet 75 mg (75 mg Oral Given 11/01/22 0956)  insulin aspart (novoLOG) injection 0-6 Units ( Subcutaneous Not Given 11/01/22 1149)  insulin aspart (novoLOG) injection 0-5 Units ( Subcutaneous Not Given 11/01/22 0204)  sodium chloride flush (NS) 0.9 % injection 3 mL (3 mLs Intravenous Given 11/01/22 0957)  acetaminophen (TYLENOL) tablet 650 mg (has no administration in time range)    Or  acetaminophen (TYLENOL) suppository 650 mg (has no administration in time range)  ondansetron (ZOFRAN) tablet 4 mg (has no administration in time range)     Or  ondansetron (ZOFRAN) injection 4 mg (has no administration in time range)  furosemide (LASIX) injection 60 mg (has no administration in time range)  magnesium sulfate IVPB 4 g 100 mL (has no administration in time range)  spironolactone (ALDACTONE) tablet 12.5 mg (has no administration in time range)  empagliflozin (JARDIANCE) tablet 10 mg (has no administration in time range)  losartan (COZAAR) tablet 25 mg (has no administration in time range)  Tdap (BOOSTRIX) injection 0.5 mL (0.5 mLs Intramuscular Given 10/31/22 1727)  acetaminophen (TYLENOL) tablet 650 mg (650 mg Oral Given 10/31/22 1727)  lidocaine-EPINEPHrine-tetracaine (LET) topical gel (3 mLs Topical Given 10/31/22 1818)  lidocaine-EPINEPHrine (XYLOCAINE W/EPI) 2 %-1:200000 (PF) injection 10 mL (10 mLs Intradermal Given 10/31/22 1859)  iohexol (OMNIPAQUE) 350 MG/ML injection 75 mL (75 mLs Intravenous Contrast Given 10/31/22 2326)  furosemide (LASIX) injection 40 mg (40 mg  Intravenous Given 11/01/22 0124)  cefTRIAXone (ROCEPHIN) 1 g in sodium chloride 0.9 % 100 mL IVPB (0 g Intravenous Stopped 11/01/22 0158)    Mobility walks     Focused Assessments Cardiac Assessment Handoff:  Cardiac Rhythm: Normal sinus rhythm No results found for: "CKTOTAL", "CKMB", "CKMBINDEX", "TROPONINI" No results found for: "DDIMER" Does the Patient currently have chest pain? No    R Recommendations: See Admitting Provider Note  Report given to:   Additional Notes:

## 2022-11-01 NOTE — H&P (Signed)
History and Physical    ILVA HIBDON GMW:[id number] DOB: 08/11/1932 DOA: 10/31/2022  PCP: Georgann Housekeeper, MD   Patient coming from: Home   Chief Complaint: Fall with forehead laceration   HPI: Sophia Ward is a very pleasant 89 y.o. female with medical history significant for hypertension, hyperlipidemia, type 2 diabetes mellitus, rheumatoid arthritis, severe AS status post TAVR, CAD, and PAF on Eliquis who presents to the emergency department with forehead laceration after a fall.   Patient was sitting in her recliner yesterday evening, leaned forward and was and reaching down trying to adjust the chair when she fell forward onto her head.  She suffered a laceration over the left eye but denies losing consciousness or suffering any other injury.  Additionally, patient reports that she had been experiencing increased swelling, weight gain, and shortness of breath little over a month ago which improved with 4 or 5 days of 40 mg Lasix daily.  She since began developing increased swelling and dyspnea again and was instructed to use 20 mg Lasix as needed.  Despite this, her dyspnea has continued to worsen.  She is not particularly dyspneic while at rest, but becomes short of breath with mild exertion.  There has not been any cough, fever, chills, or chest pain.  ED Course: Upon arrival to the ED, patient is found to be afebrile and saturating low 90s initially on room air with mild tachypnea and stable blood pressure.  Patient later desaturated to the low 80s with ambulation in the ED.  EKG demonstrates sinus rhythm.  No acute findings noted on CT head or CT cervical spine.  CTA chest is negative for PE but concerning for pulmonary edema.  Labs are most notable for creatinine 1.23, WBC 12,300, normal lactic acid, normal troponin x 2, and BNP 310.  Laceration was repaired in the ED and the patient was given Tdap, acetaminophen, IV Lasix, and Rocephin.  Review of Systems:  All other  systems reviewed and apart from HPI, are negative.  Past Medical History:  Diagnosis Date   Allergic rhinitis    Anemia 09/2019   Arthritis    hands   BPPV (benign paroxysmal positional vertigo)    Coronary artery disease    Diabetes mellitus without complication (HCC)    Essential hypertension, benign 01/30/2014   GERD (gastroesophageal reflux disease)    Hypertension    Lesion of pancreas    Mixed hyperlipidemia 01/30/2014   Myocardial infarction (HCC) 2004   mild MI-no damage- had stents   Osteopenia    Post-menopausal    Pulmonary nodules    Rheumatoid arthritis (HCC)    S/P TAVR (transcatheter aortic valve replacement) 03/03/2020   s/p TAVR with a 23 mm Edwards S3U via the TF approach by Drs Excell Seltzer and Cornelius Moras   Severe aortic stenosis    Spinal stenosis    Stenosing tenosynovitis     Past Surgical History:  Procedure Laterality Date   ABDOMINAL HYSTERECTOMY     APPENDECTOMY     BIOPSY  10/25/2019   Procedure: BIOPSY;  Surgeon: Kathi Der, MD;  Location: [facility]OSCOPY;  Service: Gastroenterology;;   BIOPSY  10/26/2019   Procedure: BIOPSY;  Surgeon: Kathi Der, MD;  Location: [facility];  Service: Gastroenterology;;   CARDIAC CATHETERIZATION  2004   carpel tunnel     COLONOSCOPY WITH PROPOFOL N/A 10/30/2012   Procedure: COLONOSCOPY WITH PROPOFOL;  Surgeon: Charolett Bumpers, MD;  Location: [facility] ENDOSCOPY;  Service: Endoscopy;  Laterality: N/A;  COLONOSCOPY WITH PROPOFOL N/A 10/26/2019   Procedure: COLONOSCOPY WITH PROPOFOL;  Surgeon: Kathi Der, MD;  Location: [facility];  Service: Gastroenterology;  Laterality: N/A;   CORONARY ANGIOPLASTY  2004   CORONARY ATHERECTOMY N/A 02/21/2020   Procedure: CORONARY ATHERECTOMY;  Surgeon: Corky Crafts, MD;  Location: [facility] INVASIVE CV LAB;  Service: Cardiovascular;  Laterality: N/A;   CORONARY PRESSURE/FFR STUDY N/A 01/29/2020   Procedure: INTRAVASCULAR PRESSURE WIRE/FFR STUDY;  Surgeon: Corky Crafts, MD;   Location: [facility] INVASIVE CV LAB;  Service: Cardiovascular;  Laterality: N/A;   CORONARY STENT INTERVENTION N/A 02/21/2020   Procedure: CORONARY STENT INTERVENTION;  Surgeon: Corky Crafts, MD;  Location: [facility] INVASIVE CV LAB;  Service: Cardiovascular;  Laterality: N/A;   CORONARY ULTRASOUND/IVUS N/A 02/21/2020   Procedure: Intravascular Ultrasound/IVUS;  Surgeon: Corky Crafts, MD;  Location: [facility] INVASIVE CV LAB;  Service: Cardiovascular;  Laterality: N/A;   DILATION AND CURETTAGE OF UTERUS     ESOPHAGOGASTRODUODENOSCOPY N/A 10/25/2019   Procedure: ESOPHAGOGASTRODUODENOSCOPY (EGD);  Surgeon: Kathi Der, MD;  Location: [facility] ENDOSCOPY;  Service: Gastroenterology;  Laterality: N/A;   ESOPHAGOGASTRODUODENOSCOPY (EGD) WITH PROPOFOL N/A 10/30/2012   Procedure: ESOPHAGOGASTRODUODENOSCOPY (EGD) WITH PROPOFOL;  Surgeon: Charolett Bumpers, MD;  Location: [facility] ENDOSCOPY;  Service: Endoscopy;  Laterality: N/A;   EYE SURGERY     bilateral cataract with lens implant   EYE SURGERY     INJECTION OF SILICONE OIL Left 02/12/2019   Procedure: Injection Of Silicone Oil;  Surgeon: Carmela Rima, MD;  Location: [facility] OR;  Service: Ophthalmology;  Laterality: Left;   left shouler surgery     PHOTOCOAGULATION WITH LASER Left 02/12/2019   Procedure: Photocoagulation With Laser;  Surgeon: Carmela Rima, MD;  Location: [facility] OR;  Service: Ophthalmology;  Laterality: Left;   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 02/12/2019   Procedure: REPAIR OF COMPLEX TRACTION RETINAL DETACHMENT;  Surgeon: Carmela Rima, MD;  Location: [facility] OR;  Service: Ophthalmology;  Laterality: Left;   RIGHT/LEFT HEART CATH AND CORONARY ANGIOGRAPHY N/A 01/29/2020   Procedure: RIGHT/LEFT HEART CATH AND CORONARY ANGIOGRAPHY;  Surgeon: Corky Crafts, MD;  Location: [facility] INVASIVE CV LAB;  Service: Cardiovascular;  Laterality: N/A;   TEE WITHOUT CARDIOVERSION N/A 03/03/2020   Procedure: TRANSESOPHAGEAL ECHOCARDIOGRAM (TEE);  Surgeon:  Tonny Bollman, MD;  Location: [facility] OR;  Service: Open Heart Surgery;  Laterality: N/A;   TONSILLECTOMY     TRANSCATHETER AORTIC VALVE REPLACEMENT, TRANSFEMORAL  03/03/2020   TRANSCATHETER AORTIC VALVE REPLACEMENT, TRANSFEMORAL N/A 03/03/2020   Procedure: TRANSCATHETER AORTIC VALVE REPLACEMENT, TRANSFEMORAL USING EDWARDS SAPIEN 3 23 MM AORTIC VALVE.;  Surgeon: Tonny Bollman, MD;  Location: [facility] OR;  Service: Open Heart Surgery;  Laterality: N/A;   VITRECTOMY 25 GAUGE WITH SCLERAL BUCKLE Left 02/12/2019   Procedure: Vitrectomy 25 Gauge With Scleral Buckle;  Surgeon: Carmela Rima, MD;  Location: [facility] OR;  Service: Ophthalmology;  Laterality: Left;    Social History:   reports that she has never smoked. She has never been exposed to tobacco smoke. She has never used smokeless tobacco. She reports that she does not drink alcohol and does not use drugs.  Allergies  Allergen Reactions   Codeine Nausea And Vomiting    MAKES HER SICK   Glimepiride Other (See Comments)    Can't remember   Jardiance [Empagliflozin] Other (See Comments)    Yeast   Metformin And Related Diarrhea   Penicillins Rash    Family History  Problem Relation Age of Onset   CAD Mother  Heart attack Mother    CAD Father    Heart Problems Brother        open heart surgery    Arthritis Daughter        psoriatic arthritis      Prior to Admission medications   Medication Sig Start Date End Date Taking? Authorizing Provider  amLODipine (NORVASC) 10 MG tablet TAKE 1 TABLET BY MOUTH ONCE DAILY IN THE MORNING 02/03/21   Corky Crafts, MD  apixaban Everlene Balls) 5 MG TABS tablet Take 1 tablet by mouth twice daily 09/05/22   Corky Crafts, MD  atorvastatin (LIPITOR) 40 MG tablet Take 1 tablet (40 mg total) by mouth at bedtime. 04/30/19   Corky Crafts, MD  B-D ULTRAFINE III SHORT PEN 31G X 8 MM MISC 1 each by Other route in the morning, at noon, in the evening, and at bedtime.  07/29/19   [provider]   Calcium Carbonate-Vitamin D (CALCIUM + D PO) Take 1 tablet by mouth daily.     [provider]  Cholecalciferol (VITAMIN D3) 125 MCG (5000 UT) CAPS Take 5,000 Units by mouth daily.     [provider]  clopidogrel (PLAVIX) 75 MG tablet Take 4 tablets (300 mg) by mouth this evening, then take 1 tablet (75 mg) by mouth daily Patient taking differently: Take 75 mg by mouth daily. Take 4 tablets (300 mg) by mouth this evening, then take 1 tablet (75 mg) by mouth daily 02/20/20   Corky Crafts, MD  desonide (DESOWEN) 0.05 % cream Apply 1 application  topically 2 (two) times daily as needed. Patient not taking: Reported on 06/21/2022    [provider]  escitalopram (LEXAPRO) 5 MG tablet Take 10 mg by mouth daily. 12/29/19   [provider]  estradiol (ESTRACE) 0.1 MG/GM vaginal cream SMARTSIG:0.5 Gram(s) Vaginal 12/25/20   [provider]  ferrous sulfate 325 (65 FE) MG tablet Take 1 tablet (325 mg total) by mouth 2 (two) times daily with a meal. 02/25/20   Arty Baumgartner, NP  folic acid (FOLVITE) 1 MG tablet Take 1 tablet by mouth once daily 04/26/22   Gearldine Bienenstock, PA-C  furosemide (LASIX) 20 MG tablet Take 1 tablet (20 mg total) by mouth daily as needed. 02/04/22   Gaston Islam., NP  gabapentin (NEURONTIN) 100 MG capsule TAKE 1 TO 2 CAPSULES BY MOUTH ONCE DAILY AT BEDTIME AS NEEDED 05/13/20   [provider]  insulin lispro (HUMALOG) 100 UNIT/ML KiwkPen Inject 6 Units into the skin 3 (three) times daily.     [provider]  LEVEMIR FLEXTOUCH 100 UNIT/ML Pen Inject 14 Units into the skin every morning.  08/25/16   [provider]  methotrexate (RHEUMATREX) 2.5 MG tablet TAKE 3 TABLETS BY MOUTH ONCE A WEEK. CAUTION: CHEMOTHERAPY. PROTECT FROM LIGHT. 10/04/22   Pollyann Savoy, MD  metoprolol succinate (TOPROL-XL) 100 MG 24 hr tablet TAKE 1 TABLET BY MOUTH IN THE MORNING 02/03/21   Corky Crafts, MD  nitroGLYCERIN  (NITROSTAT) 0.4 MG SL tablet DISSOLVE ONE TABLET UNDER THE TONGUE EVERY 5 MINUTES AS NEEDED FOR CHEST PAIN.  DO NOT EXCEED A TOTAL OF 3 DOSES IN 15 MINUTES 11/04/20   Corky Crafts, MD  Omega-3 Fatty Acids (FISH OIL PO) Take 1 capsule by mouth daily.     [provider]  ondansetron (ZOFRAN ODT) 4 MG disintegrating tablet Take 1 tablet (4 mg total) by mouth every 8 (eight) hours  as needed for nausea or vomiting. 04/18/20   Joy, Shawn C, PA-C  pantoprazole (PROTONIX) 40 MG tablet Take 1 tablet (40 mg total) by mouth daily after lunch. 10/27/19   Osvaldo Shipper, MD  predniSONE (DELTASONE) 1 MG tablet Take 3 tablets (3 mg total) by mouth daily. 07/11/22   Gearldine Bienenstock, PA-C  ramipril (ALTACE) 10 MG tablet Take 10 mg by mouth 2 (two) times daily.     [provider]    Physical Exam: Vitals:   10/31/22 2142 10/31/22 2210 10/31/22 2345 11/01/22 0000  BP:  (!) 120/54 (!) 123/53 (!) 125/56  Pulse:  (!) 58 68 (!) 57  Resp:  (!) 23 17 15   Temp: 97.7 F (36.5 C)     TempSrc: Oral     SpO2:  94% 90% 91%  Weight:      Height:        Constitutional: NAD, calm  Eyes: PERTLA, lids and conjunctivae normal ENMT: Mucous membranes are moist. Posterior pharynx clear of any exudate or lesions.   Neck: supple, no masses  Respiratory: no wheezing, fine rales bilaterally. No accessory muscle use.  Cardiovascular: S1 & S2 heard, regular rate and rhythm. Bilateral lower extremity edema.  Abdomen: No distension, no tenderness, soft. Bowel sounds active.  Musculoskeletal: no clubbing / cyanosis. No joint deformity upper and lower extremities.   Skin: Left forehead laceration approximated with sutures with surrounding ecchymosis. Skin otherwise warm, dry, well-perfused. Neurologic: CN 2-12 grossly intact. Moving all extremities. Alert and oriented.  Psychiatric: Pleasant. Cooperative.    Labs and Imaging on Admission: I have personally reviewed following labs and imaging  studies  CBC: Recent Labs  Lab 10/31/22 1717 10/31/22 1725  WBC 12.3*  --   HGB 12.7 13.6  HCT 38.7 40.0  MCV 92.6  --   PLT 342  --    Basic Metabolic Panel: Recent Labs  Lab 10/31/22 1717 10/31/22 1725  NA 134* 136  K 4.0 4.8  CL 103 102  CO2 23  --   GLUCOSE 198* 194*  BUN 20 27*  CREATININE 1.23* 1.20*  CALCIUM 8.7*  --    GFR: Estimated Creatinine Clearance: 32.5 mL/min (A) (by C-G formula based on SCr of 1.2 mg/dL (H)). Liver Function Tests: Recent Labs  Lab 10/31/22 1717  AST 23  ALT 24  ALKPHOS 54  BILITOT 0.6  PROT 6.8  ALBUMIN 3.2*   No results for input(s): "LIPASE", "AMYLASE" in the last 168 hours. No results for input(s): "AMMONIA" in the last 168 hours. Coagulation Profile: Recent Labs  Lab 10/31/22 1717  INR 1.3*   Cardiac Enzymes: No results for input(s): "CKTOTAL", "CKMB", "CKMBINDEX", "TROPONINI" in the last 168 hours. BNP (last 3 results) No results for input(s): "PROBNP" in the last 8760 hours. HbA1C: No results for input(s): "HGBA1C" in the last 72 hours. CBG: No results for input(s): "GLUCAP" in the last 168 hours. Lipid Profile: No results for input(s): "CHOL", "HDL", "LDLCALC", "TRIG", "CHOLHDL", "LDLDIRECT" in the last 72 hours. Thyroid Function Tests: No results for input(s): "TSH", "T4TOTAL", "FREET4", "T3FREE", "THYROIDAB" in the last 72 hours. Anemia Panel: No results for input(s): "VITAMINB12", "FOLATE", "FERRITIN", "TIBC", "IRON", "RETICCTPCT" in the last 72 hours. Urine analysis:    Component Value Date/Time   COLORURINE YELLOW 10/31/2022 2129   APPEARANCEUR CLOUDY (A) 10/31/2022 2129   LABSPEC 1.008 10/31/2022 2129   PHURINE 5.0 10/31/2022 2129   GLUCOSEU NEGATIVE 10/31/2022 2129   HGBUR MODERATE (A) 10/31/2022 2129  BILIRUBINUR NEGATIVE 10/31/2022 2129   KETONESUR NEGATIVE 10/31/2022 2129   PROTEINUR NEGATIVE 10/31/2022 2129   NITRITE NEGATIVE 10/31/2022 2129   LEUKOCYTESUR LARGE (A) 10/31/2022 2129    Sepsis Labs: @LABRCNTIP (procalcitonin:4,lacticidven:4) )No results found for this or any previous visit (from the past 240 hour(s)).   Radiological Exams on Admission: CT Angio Chest PE W and/or Wo Contrast  Result Date: 11/01/2022 CLINICAL DATA:  Pulmonary embolus suspected with high probability. EXAM: CT ANGIOGRAPHY CHEST WITH CONTRAST TECHNIQUE: Multidetector CT imaging of the chest was performed using the standard protocol during bolus administration of intravenous contrast. Multiplanar CT image reconstructions and MIPs were obtained to evaluate the vascular anatomy. RADIATION DOSE REDUCTION: This exam was performed according to the departmental dose-optimization program which includes automated exposure control, adjustment of the mA and/or kV according to patient size and/or use of iterative reconstruction technique. CONTRAST:  75mL OMNIPAQUE IOHEXOL 350 MG/ML SOLN COMPARISON:  02/07/2020 FINDINGS: Cardiovascular: Technically adequate study with good opacification of the central and segmental pulmonary arteries. No focal filling defects. No evidence of significant pulmonary embolus. Mild cardiac enlargement. Aortic valve prosthesis. Calcification in the mitral valve annulus. Calcification of the aorta and coronary arteries. No aortic aneurysm. Mediastinum/Nodes: Small esophageal hiatal hernia. Esophagus is decompressed. Thyroid gland is unremarkable. Prominent mediastinal lymph nodes with left aortopulmonic wall nodes measuring up to 1.2 cm short axis dimension. Lymph nodes are similar to prior study, likely reactive. Lungs/Pleura: Mosaic attenuation pattern to the lungs with scattered interstitial septal thickening and ground-glass infiltrates throughout. Changes are likely due to edema but could indicate multifocal or atypical pneumonia in the appropriate clinical setting. No pleural effusions. No pneumothorax. Upper Abdomen: No acute abnormalities demonstrated. Musculoskeletal: Degenerative changes  in the spine. Review of the MIP images confirms the above findings. IMPRESSION: 1. No evidence of significant pulmonary embolus. 2. Diffuse interstitial and alveolar infiltrates throughout the lungs likely representing edema. Multifocal or atypical pneumonia could also have this appearance in the appropriate setting. 3. Small esophageal hiatal hernia. 4. Aortic atherosclerosis. Electronically Signed   By: Burman Nieves M.D.   On: 11/01/2022 00:24   CT CERVICAL SPINE WO CONTRAST  Result Date: 10/31/2022 CLINICAL DATA:  Trauma EXAM: CT CERVICAL SPINE WITHOUT CONTRAST TECHNIQUE: Multidetector CT imaging of the cervical spine was performed without intravenous contrast. Multiplanar CT image reconstructions were also generated. RADIATION DOSE REDUCTION: This exam was performed according to the departmental dose-optimization program which includes automated exposure control, adjustment of the mA and/or kV according to patient size and/or use of iterative reconstruction technique. COMPARISON:  Cervical spine CT 04/18/2020 FINDINGS: Alignment: Normal. Skull base and vertebrae: No acute fracture. No primary bone lesion or focal pathologic process. Soft tissues and spinal canal: No prevertebral fluid or swelling. No visible canal hematoma. Disc levels: There is moderate disc space narrowing and endplate osteophyte formation at C3-C4, C4-C5 and C5-C6 compatible with degenerative change. There is no significant central canal or neural foraminal stenosis at any level. There is mild central canal stenosis at C5-C6. Upper chest: Negative. Other: None. IMPRESSION: 1. No acute fracture or traumatic subluxation of the cervical spine. 2. Moderate degenerative changes of the cervical spine. Electronically Signed   By: Darliss Cheney M.D.   On: 10/31/2022 18:40   CT HEAD WO CONTRAST  Result Date: 10/31/2022 CLINICAL DATA:  Trauma EXAM: CT HEAD WITHOUT CONTRAST TECHNIQUE: Contiguous axial images were obtained from the base of the  skull through the vertex without intravenous contrast. RADIATION DOSE REDUCTION: This exam was performed according  to the departmental dose-optimization program which includes automated exposure control, adjustment of the mA and/or kV according to patient size and/or use of iterative reconstruction technique. COMPARISON:  Head CT 04/18/2020 FINDINGS: Brain: No evidence of acute infarction, hemorrhage, hydrocephalus, extra-axial collection or mass lesion/mass effect. There is an old lacunar infarct in the right basal ganglia, unchanged. Vascular: Atherosclerotic calcifications are present within the cavernous internal carotid arteries. Skull: Normal. Negative for fracture or focal lesion. Sinuses/Orbits: No acute finding. Other: None. IMPRESSION: No acute intracranial process. Electronically Signed   By: Darliss Cheney M.D.   On: 10/31/2022 18:37   DG Pelvis Portable  Result Date: 10/31/2022 CLINICAL DATA:  Fall on blood thinners EXAM: PORTABLE PELVIS 1 VIEWS COMPARISON:  None Available. FINDINGS: There is no evidence of pelvic fracture or diastasis. No pelvic bone lesions are seen. IMPRESSION: No acute fracture or dislocation. Electronically Signed   By: Agustin Cree M.D.   On: 10/31/2022 17:38   DG Chest Port 1 View  Result Date: 10/31/2022 CLINICAL DATA:  Fall on thinners EXAM: PORTABLE CHEST 1 VIEW COMPARISON:  Chest radiograph dated 02/28/2020 FINDINGS: Normal lung volumes. No focal consolidations. No pleural effusion or pneumothorax. Mildly enlarged cardiomediastinal silhouette status post aortic valve replacement. No radiographic finding of acute displaced fracture. IMPRESSION: 1. No radiographic finding of acute displaced fracture. 2. Mildly enlarged cardiomediastinal silhouette status post aortic valve replacement. Electronically Signed   By: Agustin Cree M.D.   On: 10/31/2022 17:37    EKG: Independently reviewed. Sinus rhythm.   Assessment/Plan   1. Acute on chronic diastolic CHF; acute hypoxic  respiratory failure  - Hypoxic with mild exertion in ED, reports recent increase in peripheral edema, and is found to have pulmonary edema on chest CT  - Given 40 mg IV Lasix in ED  - Continue diuresis with 40 mg IV Lasix q12h, monitor weight and I/Os, update echocardiogram    2. CKD 3A  - SCr is 1.23 on admission, up from 1.03 in February 2024    - Renally-dose medications, monitor    3. CAD - No anginal complaints  - Continue Lipitor, Plavix, metoprolol   4. Hypertension  - Continue amlodipine and metoprolol, hold ACE-i for now given increased creatinine   5. Type II DM  - A1c was only 5.1% in 2021  - Check CBGs, continue short-acting insulin as needed    6. PAF  - Continue Eliquis and metoprolol   7. Rheumatoid arthritis  - Managed with methotrexate and low-dose daily prednisone   8. Forehead laceration  - No underlying fracture or intracranial bleed on CT  - Sutured in ED, patient aware of need for suture removal in 7 days    DVT prophylaxis: Eliquis  Code Status: Full  Level of Care: Level of care: Telemetry Cardiac Family Communication: None present  Disposition Plan:  Patient is from: home  Anticipated d/c is to: Home   Anticipated d/c date is: 6/5 or 11/03/22  Patient currently: Pending improved oxygenation  Consults called: None  Admission status: Observation     Briscoe Deutscher, MD [facility]  11/01/2022, 1:40 AM

## 2022-11-01 NOTE — Assessment & Plan Note (Signed)
Insulin sliding scale for glucose cover and monitoring  

## 2022-11-01 NOTE — Assessment & Plan Note (Signed)
Continue rate control with metoprolol and anticoagulation with apixaban.  Continue telemetry monitoring.  

## 2022-11-01 NOTE — Hospital Course (Addendum)
Mrs. Heironimus was admitted to the hospital with the working diagnosis of heart failure exacerbation.   89 yo female with the past medical history of heart failure, atrial fibrillation, hypertension, AS sp TAVR, hyperlipidemia and T2DM who presented after a mechanical fall. Patient fall from her recliner while leaning forward trying to adjust the chair. She had head trauma with laceration over her left eye. No loss of consciousness. Endorsed worsening dyspnea, and edema over last 4 weeks. Positive weight gain. Her symptoms improved transitory with oral furosemide. On her initial physical examination her 02 saturation was 80's on ambulation, blood pressure 120/54, HR 58, RR 23, and 02 saturation 94%, lungs with no wheezing or rales, heart with S1 and S2 present and regular, abdomen with no distention and positive lower extremity edema.  Chest radiograph with cardiomegaly with bilateral hilar vascular congestion and bilateral interstitial infiltrates. Positive fluid in the right fissure.

## 2022-11-02 DIAGNOSIS — I272 Pulmonary hypertension, unspecified: Secondary | ICD-10-CM | POA: Diagnosis not present

## 2022-11-02 DIAGNOSIS — I480 Paroxysmal atrial fibrillation: Secondary | ICD-10-CM | POA: Diagnosis not present

## 2022-11-02 DIAGNOSIS — I5033 Acute on chronic diastolic (congestive) heart failure: Secondary | ICD-10-CM | POA: Diagnosis not present

## 2022-11-02 DIAGNOSIS — J9601 Acute respiratory failure with hypoxia: Secondary | ICD-10-CM | POA: Diagnosis not present

## 2022-11-02 LAB — BASIC METABOLIC PANEL
Anion gap: 10 (ref 5–15)
Anion gap: 11 (ref 5–15)
BUN: 16 mg/dL (ref 8–23)
BUN: 19 mg/dL (ref 8–23)
CO2: 25 mmol/L (ref 22–32)
CO2: 25 mmol/L (ref 22–32)
Calcium: 8.5 mg/dL — ABNORMAL LOW (ref 8.9–10.3)
Calcium: 8.8 mg/dL — ABNORMAL LOW (ref 8.9–10.3)
Chloride: 100 mmol/L (ref 98–111)
Chloride: 98 mmol/L (ref 98–111)
Creatinine, Ser: 1.06 mg/dL — ABNORMAL HIGH (ref 0.44–1.00)
Creatinine, Ser: 1.31 mg/dL — ABNORMAL HIGH (ref 0.44–1.00)
GFR, Estimated: 51 mL/min — ABNORMAL LOW (ref >60)
GFR, Estimated: 39 mL/min — ABNORMAL LOW (ref >60)
Glucose, Bld: 115 mg/dL — ABNORMAL HIGH (ref 70–99)
Glucose, Bld: 136 mg/dL — ABNORMAL HIGH (ref 70–99)
Potassium: 3.1 mmol/L — ABNORMAL LOW (ref 3.5–5.1)
Potassium: 4.6 mmol/L (ref 3.5–5.1)
Sodium: 135 mmol/L (ref 135–145)
Sodium: 134 mmol/L — ABNORMAL LOW (ref 135–145)

## 2022-11-02 LAB — GLUCOSE, CAPILLARY
Glucose-Capillary: 114 mg/dL — ABNORMAL HIGH (ref 70–99)
Glucose-Capillary: 292 mg/dL — ABNORMAL HIGH (ref 70–99)
Glucose-Capillary: 135 mg/dL — ABNORMAL HIGH (ref 70–99)
Glucose-Capillary: 159 mg/dL — ABNORMAL HIGH (ref 70–99)

## 2022-11-02 LAB — HEMOGLOBIN A1C
Hgb A1c MFr Bld: 6.4 % — ABNORMAL HIGH (ref 4.8–5.6)
Mean Plasma Glucose: 137 mg/dL

## 2022-11-02 LAB — URINE CULTURE: Culture: >=100000 — AB

## 2022-11-02 LAB — MAGNESIUM: Magnesium: 2.4 mg/dL (ref 1.7–2.4)

## 2022-11-02 MED ORDER — SPIRONOLACTONE 25 MG PO TABS
25.0000 mg | ORAL_TABLET | Freq: Every day | ORAL | Status: DC
  Administered 2022-11-03 – 2022-11-04 (×2): 25 mg via ORAL
  Filled 2022-11-02 (×2): qty 1

## 2022-11-02 MED ORDER — POTASSIUM CHLORIDE CRYS ER 20 MEQ PO TBCR
40.0000 meq | EXTENDED_RELEASE_TABLET | Freq: Two times a day (BID) | ORAL | Status: AC
  Administered 2022-11-02 (×2): 40 meq via ORAL
  Filled 2022-11-02 (×2): qty 2

## 2022-11-02 MED ORDER — FUROSEMIDE 10 MG/ML IJ SOLN
60.0000 mg | Freq: Once | INTRAMUSCULAR | Status: AC
  Administered 2022-11-02: 60 mg via INTRAVENOUS
  Filled 2022-11-02: qty 6

## 2022-11-02 MED ORDER — FLUTICASONE PROPIONATE 50 MCG/ACT NA SUSP
1.0000 | Freq: Two times a day (BID) | NASAL | Status: DC | PRN
  Administered 2022-11-02: 1 via NASAL
  Filled 2022-11-02: qty 16

## 2022-11-02 NOTE — Consult Note (Addendum)
Cardiology Consultation   Patient ID: Sophia Ward MRN: [id number]; DOB: 1932/11/11  Admit date: 10/31/2022 Date of Consult: 11/02/2022  PCP:  Georgann Housekeeper, MD   [facility] Providers Cardiologist:  Lance Muss, MD        Patient Profile:   Sophia Ward is a 89 y.o. female with a hx of CAD (DES to LAD 2004, RCA 2021), paroxysmal atrial fibrillation, aortic stenosis s/p TAVR 2021, pulmonary hypertension, hypertension, hyperlipidemia, DM type II who is being seen 11/02/2022 for the evaluation of pulmonary hypertension at the request of Dr. Joya Martyr.  History of Present Illness:   Sophia Ward presented to the hospital on 6/4 for evaluation after a fall at home. This was a mechanical-sounding fall with patient falling after she leaned forward from her recliner. While undergoing evaluation in the ED, patient also reported weight gain, lower extremity edema, and dyspnea, first occurring about a month ago. She was treated with 5 day course of lasix at home (per Dr. Donette Larry, PCP) at that time and had improvement. Symptoms then recurred a few weeks later and she was instructed by Dr. Eldridge Dace to take 20mg  of lasix PRN. Appears that patient was taking 20mg  3 times per week. Patient noted to be dyspneic/hypoxic in the ED with minimal ambulation. CXR with cardiomegaly and vascular congestion. CTA chest concerning for pulmonary edema, negative for PE. Patient received IV lasix and was admitted for further management of heart failure exacerbation.  Patient with longstanding cardiac history. She received DES to LAD in 2004 in setting of STEMI. Patient had early in-stent restenosis and underwent repeat PCI and stenting 5 months later. Patient then without recurrent cardiac issues until May 2021 when she developed paroxysmal atrial fibrillation. She was started on Eliquis and Plavix was stopped. TTE during that admission showed progressive aortic stenosis, severe. She was initiated on  TAVR workup but LHC delayed when she was readmitted in August 2021 with acute hypoxic respiratory failure/acute decompensated diastolic CHF. Patient had LHC/RHC in September 2021 prior to TAVR and was found with severe proximal 2 vessel CAD with ostial stenosis of RCA and 70% proximal stenosis of LAD proximal to prior stents. Nonobstructive CAD in LCX. PA pressures moderately elevated on RHC. Patient had successful staged PCI of ostial RCA and orbital atherectomy and PCI of LAD. Patient then underwent successful TAVR on 03/03/20. Post operative echo with normal TAVR function. Of note, patient was off Eliquis for a period of time in 2021 due to exacerbation of chronic anemia.   Past Medical History:  Diagnosis Date   Allergic rhinitis    Anemia 09/2019   Arthritis    hands   BPPV (benign paroxysmal positional vertigo)    Coronary artery disease    Diabetes mellitus without complication (HCC)    Essential hypertension, benign 01/30/2014   GERD (gastroesophageal reflux disease)    Hypertension    Lesion of pancreas    Mixed hyperlipidemia 01/30/2014   Myocardial infarction (HCC) 2004   mild MI-no damage- had stents   Osteopenia    Post-menopausal    Pulmonary nodules    Rheumatoid arthritis (HCC)    S/P TAVR (transcatheter aortic valve replacement) 03/03/2020   s/p TAVR with a 23 mm Edwards S3U via the TF approach by Drs Excell Seltzer and Cornelius Moras   Severe aortic stenosis    Spinal stenosis    Stenosing tenosynovitis     Past Surgical History:  Procedure Laterality Date   ABDOMINAL HYSTERECTOMY     APPENDECTOMY  BIOPSY  10/25/2019   Procedure: BIOPSY;  Surgeon: Kathi Der, MD;  Location: [facility];  Service: Gastroenterology;;   BIOPSY  10/26/2019   Procedure: BIOPSY;  Surgeon: Kathi Der, MD;  Location: [facility];  Service: Gastroenterology;;   CARDIAC CATHETERIZATION  2004   carpel tunnel     COLONOSCOPY WITH PROPOFOL N/A 10/30/2012   Procedure: COLONOSCOPY WITH PROPOFOL;   Surgeon: Charolett Bumpers, MD;  Location: [facility];  Service: Endoscopy;  Laterality: N/A;   COLONOSCOPY WITH PROPOFOL N/A 10/26/2019   Procedure: COLONOSCOPY WITH PROPOFOL;  Surgeon: Kathi Der, MD;  Location: [facility];  Service: Gastroenterology;  Laterality: N/A;   CORONARY ANGIOPLASTY  2004   CORONARY ATHERECTOMY N/A 02/21/2020   Procedure: CORONARY ATHERECTOMY;  Surgeon: Corky Crafts, MD;  Location: [facility] INVASIVE CV LAB;  Service: Cardiovascular;  Laterality: N/A;   CORONARY PRESSURE/FFR STUDY N/A 01/29/2020   Procedure: INTRAVASCULAR PRESSURE WIRE/FFR STUDY;  Surgeon: Corky Crafts, MD;  Location: [facility] INVASIVE CV LAB;  Service: Cardiovascular;  Laterality: N/A;   CORONARY STENT INTERVENTION N/A 02/21/2020   Procedure: CORONARY STENT INTERVENTION;  Surgeon: Corky Crafts, MD;  Location: [facility] INVASIVE CV LAB;  Service: Cardiovascular;  Laterality: N/A;   CORONARY ULTRASOUND/IVUS N/A 02/21/2020   Procedure: Intravascular Ultrasound/IVUS;  Surgeon: Corky Crafts, MD;  Location: [facility] INVASIVE CV LAB;  Service: Cardiovascular;  Laterality: N/A;   DILATION AND CURETTAGE OF UTERUS     ESOPHAGOGASTRODUODENOSCOPY N/A 10/25/2019   Procedure: ESOPHAGOGASTRODUODENOSCOPY (EGD);  Surgeon: Kathi Der, MD;  Location: [facility] ENDOSCOPY;  Service: Gastroenterology;  Laterality: N/A;   ESOPHAGOGASTRODUODENOSCOPY (EGD) WITH PROPOFOL N/A 10/30/2012   Procedure: ESOPHAGOGASTRODUODENOSCOPY (EGD) WITH PROPOFOL;  Surgeon: Charolett Bumpers, MD;  Location: [facility] ENDOSCOPY;  Service: Endoscopy;  Laterality: N/A;   EYE SURGERY     bilateral cataract with lens implant   EYE SURGERY     INJECTION OF SILICONE OIL Left 02/12/2019   Procedure: Injection Of Silicone Oil;  Surgeon: Carmela Rima, MD;  Location: [facility] OR;  Service: Ophthalmology;  Laterality: Left;   left shouler surgery     PHOTOCOAGULATION WITH LASER Left 02/12/2019   Procedure: Photocoagulation With Laser;  Surgeon: Carmela Rima, MD;  Location: [facility] OR;  Service: Ophthalmology;  Laterality: Left;   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 02/12/2019   Procedure: REPAIR OF COMPLEX TRACTION RETINAL DETACHMENT;  Surgeon: Carmela Rima, MD;  Location: [facility] OR;  Service: Ophthalmology;  Laterality: Left;   RIGHT/LEFT HEART CATH AND CORONARY ANGIOGRAPHY N/A 01/29/2020   Procedure: RIGHT/LEFT HEART CATH AND CORONARY ANGIOGRAPHY;  Surgeon: Corky Crafts, MD;  Location: [facility] INVASIVE CV LAB;  Service: Cardiovascular;  Laterality: N/A;   TEE WITHOUT CARDIOVERSION N/A 03/03/2020   Procedure: TRANSESOPHAGEAL ECHOCARDIOGRAM (TEE);  Surgeon: Tonny Bollman, MD;  Location: [facility] OR;  Service: Open Heart Surgery;  Laterality: N/A;   TONSILLECTOMY     TRANSCATHETER AORTIC VALVE REPLACEMENT, TRANSFEMORAL  03/03/2020   TRANSCATHETER AORTIC VALVE REPLACEMENT, TRANSFEMORAL N/A 03/03/2020   Procedure: TRANSCATHETER AORTIC VALVE REPLACEMENT, TRANSFEMORAL USING EDWARDS SAPIEN 3 23 MM AORTIC VALVE.;  Surgeon: Tonny Bollman, MD;  Location: [facility] OR;  Service: Open Heart Surgery;  Laterality: N/A;   VITRECTOMY 25 GAUGE WITH SCLERAL BUCKLE Left 02/12/2019   Procedure: Vitrectomy 25 Gauge With Scleral Buckle;  Surgeon: Carmela Rima, MD;  Location: [facility] OR;  Service: Ophthalmology;  Laterality: Left;     Home Medications:  Prior to Admission medications   Medication Sig Start Date End Date Taking? Authorizing Provider  amLODipine (NORVASC) 10 MG tablet TAKE 1 TABLET BY MOUTH ONCE DAILY IN THE MORNING 02/03/21  Yes Corky Crafts, MD  apixaban Everlene Balls) 5 MG TABS tablet Take 1 tablet by mouth twice daily 09/05/22  Yes Corky Crafts, MD  atorvastatin (LIPITOR) 40 MG tablet Take 1 tablet (40 mg total) by mouth at bedtime. 04/30/19  Yes Corky Crafts, MD  Calcium Carbonate-Vitamin D (CALCIUM + D PO) Take 1 tablet by mouth daily.    Yes [provider]  Cholecalciferol (VITAMIN D3) 125 MCG (5000 UT) CAPS Take 5,000  Units by mouth daily.    Yes [provider]  clopidogrel (PLAVIX) 75 MG tablet Take 4 tablets (300 mg) by mouth this evening, then take 1 tablet (75 mg) by mouth daily Patient taking differently: Take 75 mg by mouth daily. 02/20/20  Yes Corky Crafts, MD  escitalopram (LEXAPRO) 5 MG tablet Take 10 mg by mouth daily. 12/29/19  Yes [provider]  estradiol (ESTRACE) 0.1 MG/GM vaginal cream Place 1 Applicatorful vaginally 2 (two) times a week. 12/25/20  Yes [provider]  ferrous sulfate 325 (65 FE) MG tablet Take 1 tablet (325 mg total) by mouth 2 (two) times daily with a meal. 02/25/20  Yes Arty Baumgartner, NP  folic acid (FOLVITE) 1 MG tablet Take 1 tablet by mouth once daily 04/26/22  Yes Gearldine Bienenstock, PA-C  furosemide (LASIX) 20 MG tablet Take 1 tablet (20 mg total) by mouth daily as needed. 02/04/22  Yes Gaston Islam., NP  gabapentin (NEURONTIN) 100 MG capsule Take 200 mg by mouth at bedtime. 05/13/20  Yes [provider]  insulin lispro (HUMALOG) 100 UNIT/ML KiwkPen Inject 6 Units into the skin 3 (three) times daily.    Yes [provider]  LEVEMIR FLEXTOUCH 100 UNIT/ML Pen Inject 8 Units into the skin every morning. 08/25/16  Yes [provider]  methotrexate (RHEUMATREX) 2.5 MG tablet TAKE 3 TABLETS BY MOUTH ONCE A WEEK. CAUTION: CHEMOTHERAPY. PROTECT FROM LIGHT. 10/04/22  Yes Deveshwar, Janalyn Rouse, MD  metoprolol succinate (TOPROL-XL) 100 MG 24 hr tablet TAKE 1 TABLET BY MOUTH IN THE MORNING 02/03/21  Yes Corky Crafts, MD  Omega-3 Fatty Acids (FISH OIL PO) Take 1 capsule by mouth daily.    Yes [provider]  pantoprazole (PROTONIX) 40 MG tablet Take 1 tablet (40 mg total) by mouth daily after lunch. 10/27/19  Yes Osvaldo Shipper, MD  predniSONE (DELTASONE) 1 MG tablet Take 3 tablets (3 mg total) by mouth daily. 07/11/22  Yes Gearldine Bienenstock, PA-C  ramipril (ALTACE) 10 MG tablet Take 10 mg by mouth 2 (two) times daily.     Yes [provider]  B-D ULTRAFINE III SHORT PEN 31G X 8 MM MISC 1 each by Other route in the morning, at noon, in the evening, and at bedtime.  07/29/19   [provider]  desonide (DESOWEN) 0.05 % cream Apply 1 application  topically 2 (two) times daily as needed.    [provider]  nitroGLYCERIN (NITROSTAT) 0.4 MG SL tablet DISSOLVE ONE TABLET UNDER THE TONGUE EVERY 5 MINUTES AS NEEDED FOR CHEST PAIN.  DO NOT EXCEED A TOTAL OF 3 DOSES IN 15 MINUTES 11/04/20   Corky Crafts, MD    Inpatient Medications: Scheduled Meds:  apixaban  5 mg Oral BID   atorvastatin  40 mg Oral QHS   clopidogrel  75 mg Oral Daily   escitalopram  10 mg Oral  Daily   furosemide  60 mg Intravenous Q12H   insulin aspart  0-5 Units Subcutaneous QHS   insulin aspart  0-6 Units Subcutaneous TID WC   losartan  25 mg Oral Daily   metoprolol succinate  100 mg Oral q morning   pantoprazole  40 mg Oral Daily   potassium chloride  40 mEq Oral BID   predniSONE  3 mg Oral Q breakfast   sodium chloride flush  3 mL Intravenous Q12H   [START ON 11/03/2022] spironolactone  25 mg Oral Daily   Continuous Infusions:  PRN Meds: acetaminophen **OR** acetaminophen, fluticasone, ondansetron **OR** ondansetron (ZOFRAN) IV  Allergies:    Allergies  Allergen Reactions   Codeine Nausea And Vomiting    MAKES HER SICK   Glimepiride Other (See Comments)    Can't remember   Jardiance [Empagliflozin] Other (See Comments)    Yeast   Metformin And Related Diarrhea   Penicillins Rash    Social History:   Social History   Socioeconomic History   Marital status: Widowed    Spouse name: Not on file   Number of children: Not on file   Years of education: Not on file   Highest education level: Not on file  Occupational History   Not on file  Tobacco Use   Smoking status: Never    Passive exposure: Never   Smokeless tobacco: Never  Vaping Use   Vaping Use: Never used  Substance and Sexual  Activity   Alcohol use: No   Drug use: No   Sexual activity: Not Currently  Other Topics Concern   Not on file  Social History Narrative   Not on file   Social Determinants of Health   Financial Resource Strain: Not on file  Food Insecurity: No Food Insecurity (11/01/2022)   Hunger Vital Sign    Worried About Running Out of Food in the Last Year: Never true    Ran Out of Food in the Last Year: Never true  Transportation Needs: No Transportation Needs (11/01/2022)   PRAPARE - Administrator, Civil Service (Medical): No    Lack of Transportation (Non-Medical): No  Physical Activity: Not on file  Stress: Not on file  Social Connections: Not on file  Intimate Partner Violence: Not At Risk (11/01/2022)   Humiliation, Afraid, Rape, and Kick questionnaire    Fear of Current or Ex-Partner: No    Emotionally Abused: No    Physically Abused: No    Sexually Abused: No    Family History:    Family History  Problem Relation Age of Onset   CAD Mother    Heart attack Mother    CAD Father    Heart Problems Brother        open heart surgery    Arthritis Daughter        psoriatic arthritis      ROS:  Please see the history of present illness.   All other ROS reviewed and negative.     Physical Exam/Data:   Vitals:   11/02/22 0407 11/02/22 0430 11/02/22 0803 11/02/22 1131  BP:  (!) 109/35 (!) 112/52 (!) 115/49  Pulse:  65 68 65  Resp:  18 18 18   Temp:  98.2 F (36.8 C) 98.1 F (36.7 C) 98.2 F (36.8 C)  TempSrc:  Oral Oral Oral  SpO2:  94% 93% 94%  Weight: 79.7 kg     Height:        Intake/Output Summary (Last 24  hours) at 11/02/2022 1308 Last data filed at 11/02/2022 0801 Gross per 24 hour  Intake 595 ml  Output --  Net 595 ml      11/02/2022    4:07 AM 11/01/2022    6:46 PM 10/31/2022    5:08 PM  Last 3 Weights  Weight (lbs) 175 lb 11.2 oz 175 lb 11.3 oz 178 lb  Weight (kg) 79.697 kg 79.7 kg 80.74 kg     Body mass index is 32.14 kg/m.  General:  Well  nourished, well developed, in no acute distress HEENT: normal Neck: no JVD Vascular: No carotid bruits; Distal pulses 2+ bilaterally Cardiac:  normal S1, S2; RRR; no murmur  Lungs:  clear to auscultation bilaterally, no wheezing, rhonchi or rales  Abd: soft, nontender, no hepatomegaly  Ext: no edema Musculoskeletal:  No deformities, BUE and BLE strength normal and equal Skin: warm and dry  Neuro:  CNs 2-12 intact, no focal abnormalities noted Psych:  Normal affect   EKG:  The EKG was personally reviewed and demonstrates:  sinus rhythm Telemetry:  Telemetry was personally reviewed and demonstrates:  sinus rhythm  Relevant CV Studies:  11/02/22 TTE  IMPRESSIONS     1. Left ventricular ejection fraction, by estimation, is 60 to 65%. The  left ventricle has normal function. The left ventricle has no regional  wall motion abnormalities. Left ventricular diastolic parameters are  consistent with Grade II diastolic  dysfunction (pseudonormalization).   2. Right ventricular systolic function is normal. The right ventricular  size is normal. There is severely elevated pulmonary artery systolic  pressure. The estimated right ventricular systolic pressure is 62.5 mmHg.   3. The mitral valve is normal in structure. No evidence of mitral valve  regurgitation. No evidence of mitral stenosis. Moderate mitral annular  calcification.   4. Tricuspid valve regurgitation is mild to moderate.   5. The aortic valve is very poorly visualized, however, appears  calcified. Gradients are consistent with mild-moderate stenosis.   6. The inferior vena cava is normal in size with greater than 50%  respiratory variability, suggesting right atrial pressure of 3 mmHg.   7. Evidence of atrial level shunting detected by color flow Doppler.   FINDINGS   Left Ventricle: Left ventricular ejection fraction, by estimation, is 60  to 65%. The left ventricle has normal function. The left ventricle has no   regional wall motion abnormalities. The left ventricular internal cavity  size was normal in size. There is   no left ventricular hypertrophy. Left ventricular diastolic parameters  are consistent with Grade II diastolic dysfunction (pseudonormalization).   Right Ventricle: The right ventricular size is normal. No increase in  right ventricular wall thickness. Right ventricular systolic function is  normal. There is severely elevated pulmonary artery systolic pressure. The  tricuspid regurgitant velocity is  3.79 m/s, and with an assumed right atrial pressure of 5 mmHg, the  estimated right ventricular systolic pressure is 62.5 mmHg.   Left Atrium: Left atrial size was normal in size.   Right Atrium: Right atrial size was normal in size.   Pericardium: There is no evidence of pericardial effusion.   Mitral Valve: The mitral valve is normal in structure. Moderate mitral  annular calcification. No evidence of mitral valve regurgitation. No  evidence of mitral valve stenosis. MV peak gradient, 10.0 mmHg. The mean  mitral valve gradient is 4.0 mmHg.   Tricuspid Valve: The tricuspid valve is normal in structure. Tricuspid  valve regurgitation is mild to moderate.  No evidence of tricuspid  stenosis.   Aortic Valve: The aortic valve was not well visualized. Aortic valve  regurgitation is not visualized. Aortic valve sclerosis/calcification is  present, without any evidence of aortic stenosis. Aortic valve mean  gradient measures 23.2 mmHg. Aortic valve  peak gradient measures 41.9 mmHg. Aortic valve area, by VTI measures 0.98  cm.   Pulmonic Valve: The pulmonic valve was normal in structure. Pulmonic valve  regurgitation is mild. No evidence of pulmonic stenosis.   Aorta: The aortic root is normal in size and structure.   Venous: The inferior vena cava is normal in size with greater than 50%  respiratory variability, suggesting right atrial pressure of 3 mmHg.   IAS/Shunts:  Evidence of atrial level shunting detected by color flow  Doppler.    Laboratory Data:  High Sensitivity Troponin:   Recent Labs  Lab 10/31/22 2115 10/31/22 2334  TROPONINIHS 10 11     Chemistry Recent Labs  Lab 10/31/22 1717 10/31/22 1725 11/01/22 0239 11/02/22 0055  NA 134* 136 137 135  K 4.0 4.8 3.9 3.1*  CL 103 102 100 100  CO2 23  --  21* 25  GLUCOSE 198* 194* 101* 115*  BUN 20 27* 18 16  CREATININE 1.23* 1.20* 1.15* 1.06*  CALCIUM 8.7*  --  8.9 8.5*  MG  --   --  1.6* 2.4  GFRNONAA 43*  --  46* 51*  ANIONGAP 8  --  16* 10    Recent Labs  Lab 10/31/22 1717  PROT 6.8  ALBUMIN 3.2*  AST 23  ALT 24  ALKPHOS 54  BILITOT 0.6   Lipids No results for input(s): "CHOL", "TRIG", "HDL", "LABVLDL", "LDLCALC", "CHOLHDL" in the last 168 hours.  Hematology Recent Labs  Lab 10/31/22 1717 10/31/22 1725 11/01/22 0239  WBC 12.3*  --  13.4*  RBC 4.18  --  4.42  HGB 12.7 13.6 13.7  HCT 38.7 40.0 40.8  MCV 92.6  --  92.3  MCH 30.4  --  31.0  MCHC 32.8  --  33.6  RDW 14.3  --  14.5  PLT 342  --  397   Thyroid No results for input(s): "TSH", "FREET4" in the last 168 hours.  BNP Recent Labs  Lab 10/31/22 2115  BNP 309.9*    DDimer No results for input(s): "DDIMER" in the last 168 hours.   Radiology/Studies:  ECHOCARDIOGRAM COMPLETE  Result Date: 11/01/2022    ECHOCARDIOGRAM REPORT   Patient Name:   Sophia Ward Date of Exam: 11/01/2022 Medical Rec #:  [id number]          Height:       62.0 in Accession #:    [id number]         Weight:       178.0 lb Date of Birth:  05-30-1933         BSA:          1.819 m Patient Age:    89 years           BP:           99/80 mmHg Patient Gender: F                  HR:           61 bpm. Exam Location:  Inpatient Procedure: 2D Echo, Color Doppler and Cardiac Doppler Indications:    CHF  History:        Patient has prior history  of Echocardiogram examinations, most                 recent 02/21/2022. CHF, CAD, Aortic Valve Disease,                  Arrythmias:Atrial Fibrillation; Risk Factors:Hypertension and                 Diabetes.  Sonographer:    Milbert Coulter Referring Phys: [id number] TIMOTHY S OPYD IMPRESSIONS  1. Left ventricular ejection fraction, by estimation, is 60 to 65%. The left ventricle has normal function. The left ventricle has no regional wall motion abnormalities. Left ventricular diastolic parameters are consistent with Grade II diastolic dysfunction (pseudonormalization).  2. Right ventricular systolic function is normal. The right ventricular size is normal. There is severely elevated pulmonary artery systolic pressure. The estimated right ventricular systolic pressure is 62.5 mmHg.  3. The mitral valve is normal in structure. No evidence of mitral valve regurgitation. No evidence of mitral stenosis. Moderate mitral annular calcification.  4. Tricuspid valve regurgitation is mild to moderate.  5. The aortic valve is very poorly visualized, however, appears calcified. Gradients are consistent with mild-moderate stenosis.  6. The inferior vena cava is normal in size with greater than 50% respiratory variability, suggesting right atrial pressure of 3 mmHg.  7. Evidence of atrial level shunting detected by color flow Doppler. FINDINGS  Left Ventricle: Left ventricular ejection fraction, by estimation, is 60 to 65%. The left ventricle has normal function. The left ventricle has no regional wall motion abnormalities. The left ventricular internal cavity size was normal in size. There is  no left ventricular hypertrophy. Left ventricular diastolic parameters are consistent with Grade II diastolic dysfunction (pseudonormalization). Right Ventricle: The right ventricular size is normal. No increase in right ventricular wall thickness. Right ventricular systolic function is normal. There is severely elevated pulmonary artery systolic pressure. The tricuspid regurgitant velocity is 3.79 m/s, and with an assumed right atrial pressure of  5 mmHg, the estimated right ventricular systolic pressure is 62.5 mmHg. Left Atrium: Left atrial size was normal in size. Right Atrium: Right atrial size was normal in size. Pericardium: There is no evidence of pericardial effusion. Mitral Valve: The mitral valve is normal in structure. Moderate mitral annular calcification. No evidence of mitral valve regurgitation. No evidence of mitral valve stenosis. MV peak gradient, 10.0 mmHg. The mean mitral valve gradient is 4.0 mmHg. Tricuspid Valve: The tricuspid valve is normal in structure. Tricuspid valve regurgitation is mild to moderate. No evidence of tricuspid stenosis. Aortic Valve: The aortic valve was not well visualized. Aortic valve regurgitation is not visualized. Aortic valve sclerosis/calcification is present, without any evidence of aortic stenosis. Aortic valve mean gradient measures 23.2 mmHg. Aortic valve peak gradient measures 41.9 mmHg. Aortic valve area, by VTI measures 0.98 cm. Pulmonic Valve: The pulmonic valve was normal in structure. Pulmonic valve regurgitation is mild. No evidence of pulmonic stenosis. Aorta: The aortic root is normal in size and structure. Venous: The inferior vena cava is normal in size with greater than 50% respiratory variability, suggesting right atrial pressure of 3 mmHg. IAS/Shunts: Evidence of atrial level shunting detected by color flow Doppler.  LEFT VENTRICLE PLAX 2D LVIDd:         4.30 cm     Diastology LVIDs:         3.00 cm     LV e' medial:    5.00 cm/s LV PW:         1.00  cm     LV E/e' medial:  31.4 LV IVS:        1.00 cm     LV e' lateral:   8.49 cm/s LVOT diam:     1.80 cm     LV E/e' lateral: 18.5 LV SV:         73 LV SV Index:   40 LVOT Area:     2.54 cm  LV Volumes (MOD) LV vol d, MOD A2C: 71.7 ml LV vol d, MOD A4C: 77.6 ml LV vol s, MOD A2C: 26.2 ml LV vol s, MOD A4C: 31.3 ml LV SV MOD A2C:     45.5 ml LV SV MOD A4C:     77.6 ml LV SV MOD BP:      46.6 ml RIGHT VENTRICLE             IVC RV Basal diam:   3.30 cm     IVC diam: 1.40 cm RV Mid diam:    3.40 cm RV S prime:     13.40 cm/s TAPSE (M-mode): 1.3 cm LEFT ATRIUM             Index        RIGHT ATRIUM           Index LA diam:        3.40 cm 1.87 cm/m   RA Area:     16.10 cm LA Vol (A2C):   62.0 ml 34.08 ml/m  RA Volume:   39.40 ml  21.66 ml/m LA Vol (A4C):   46.8 ml 25.72 ml/m LA Biplane Vol: 58.9 ml 32.37 ml/m  AORTIC VALVE AV Area (Vmax):    0.87 cm AV Area (Vmean):   0.89 cm AV Area (VTI):     0.98 cm AV Vmax:           323.75 cm/s AV Vmean:          222.500 cm/s AV VTI:            0.738 m AV Peak Grad:      41.9 mmHg AV Mean Grad:      23.2 mmHg LVOT Vmax:         111.00 cm/s LVOT Vmean:        77.600 cm/s LVOT VTI:          0.285 m LVOT/AV VTI ratio: 0.39  AORTA Ao Asc diam: 2.40 cm MITRAL VALVE                TRICUSPID VALVE MV Area (PHT): 3.08 cm     TR Peak grad:   57.5 mmHg MV Area VTI:   1.27 cm     TR Vmax:        379.00 cm/s MV Peak grad:  10.0 mmHg MV Mean grad:  4.0 mmHg     SHUNTS MV Vmax:       1.58 m/s     Systemic VTI:  0.29 m MV Vmean:      95.3 cm/s    Systemic Diam: 1.80 cm MV Decel Time: 246 msec MV E velocity: 157.00 cm/s MV A velocity: 99.70 cm/s MV E/A ratio:  1.57 Aditya Sabharwal Electronically signed by Dorthula Nettles Signature Date/Time: 11/01/2022/12:46:19 PM    Final    CT Angio Chest PE W and/or Wo Contrast  Result Date: 11/01/2022 CLINICAL DATA:  Pulmonary embolus suspected with high probability. EXAM: CT ANGIOGRAPHY CHEST WITH CONTRAST TECHNIQUE: Multidetector CT imaging of the chest was performed using  the standard protocol during bolus administration of intravenous contrast. Multiplanar CT image reconstructions and MIPs were obtained to evaluate the vascular anatomy. RADIATION DOSE REDUCTION: This exam was performed according to the departmental dose-optimization program which includes automated exposure control, adjustment of the mA and/or kV according to patient size and/or use of iterative reconstruction  technique. CONTRAST:  75mL OMNIPAQUE IOHEXOL 350 MG/ML SOLN COMPARISON:  02/07/2020 FINDINGS: Cardiovascular: Technically adequate study with good opacification of the central and segmental pulmonary arteries. No focal filling defects. No evidence of significant pulmonary embolus. Mild cardiac enlargement. Aortic valve prosthesis. Calcification in the mitral valve annulus. Calcification of the aorta and coronary arteries. No aortic aneurysm. Mediastinum/Nodes: Small esophageal hiatal hernia. Esophagus is decompressed. Thyroid gland is unremarkable. Prominent mediastinal lymph nodes with left aortopulmonic wall nodes measuring up to 1.2 cm short axis dimension. Lymph nodes are similar to prior study, likely reactive. Lungs/Pleura: Mosaic attenuation pattern to the lungs with scattered interstitial septal thickening and ground-glass infiltrates throughout. Changes are likely due to edema but could indicate multifocal or atypical pneumonia in the appropriate clinical setting. No pleural effusions. No pneumothorax. Upper Abdomen: No acute abnormalities demonstrated. Musculoskeletal: Degenerative changes in the spine. Review of the MIP images confirms the above findings. IMPRESSION: 1. No evidence of significant pulmonary embolus. 2. Diffuse interstitial and alveolar infiltrates throughout the lungs likely representing edema. Multifocal or atypical pneumonia could also have this appearance in the appropriate setting. 3. Small esophageal hiatal hernia. 4. Aortic atherosclerosis. Electronically Signed   By: Burman Nieves M.D.   On: 11/01/2022 00:24   CT CERVICAL SPINE WO CONTRAST  Result Date: 10/31/2022 CLINICAL DATA:  Trauma EXAM: CT CERVICAL SPINE WITHOUT CONTRAST TECHNIQUE: Multidetector CT imaging of the cervical spine was performed without intravenous contrast. Multiplanar CT image reconstructions were also generated. RADIATION DOSE REDUCTION: This exam was performed according to the departmental  dose-optimization program which includes automated exposure control, adjustment of the mA and/or kV according to patient size and/or use of iterative reconstruction technique. COMPARISON:  Cervical spine CT 04/18/2020 FINDINGS: Alignment: Normal. Skull base and vertebrae: No acute fracture. No primary bone lesion or focal pathologic process. Soft tissues and spinal canal: No prevertebral fluid or swelling. No visible canal hematoma. Disc levels: There is moderate disc space narrowing and endplate osteophyte formation at C3-C4, C4-C5 and C5-C6 compatible with degenerative change. There is no significant central canal or neural foraminal stenosis at any level. There is mild central canal stenosis at C5-C6. Upper chest: Negative. Other: None. IMPRESSION: 1. No acute fracture or traumatic subluxation of the cervical spine. 2. Moderate degenerative changes of the cervical spine. Electronically Signed   By: Darliss Cheney M.D.   On: 10/31/2022 18:40   CT HEAD WO CONTRAST  Result Date: 10/31/2022 CLINICAL DATA:  Trauma EXAM: CT HEAD WITHOUT CONTRAST TECHNIQUE: Contiguous axial images were obtained from the base of the skull through the vertex without intravenous contrast. RADIATION DOSE REDUCTION: This exam was performed according to the departmental dose-optimization program which includes automated exposure control, adjustment of the mA and/or kV according to patient size and/or use of iterative reconstruction technique. COMPARISON:  Head CT 04/18/2020 FINDINGS: Brain: No evidence of acute infarction, hemorrhage, hydrocephalus, extra-axial collection or mass lesion/mass effect. There is an old lacunar infarct in the right basal ganglia, unchanged. Vascular: Atherosclerotic calcifications are present within the cavernous internal carotid arteries. Skull: Normal. Negative for fracture or focal lesion. Sinuses/Orbits: No acute finding. Other: None. IMPRESSION: No acute intracranial process. Electronically Signed  By: Darliss Cheney M.D.   On: 10/31/2022 18:37   DG Pelvis Portable  Result Date: 10/31/2022 CLINICAL DATA:  Fall on blood thinners EXAM: PORTABLE PELVIS 1 VIEWS COMPARISON:  None Available. FINDINGS: There is no evidence of pelvic fracture or diastasis. No pelvic bone lesions are seen. IMPRESSION: No acute fracture or dislocation. Electronically Signed   By: Agustin Cree M.D.   On: 10/31/2022 17:38   DG Chest Port 1 View  Result Date: 10/31/2022 CLINICAL DATA:  Fall on thinners EXAM: PORTABLE CHEST 1 VIEW COMPARISON:  Chest radiograph dated 02/28/2020 FINDINGS: Normal lung volumes. No focal consolidations. No pleural effusion or pneumothorax. Mildly enlarged cardiomediastinal silhouette status post aortic valve replacement. No radiographic finding of acute displaced fracture. IMPRESSION: 1. No radiographic finding of acute displaced fracture. 2. Mildly enlarged cardiomediastinal silhouette status post aortic valve replacement. Electronically Signed   By: Agustin Cree M.D.   On: 10/31/2022 17:37     Assessment and Plan:   Acute on chronic HFpEF Pulmonary hypertension  Patient with hx pulmonary hypertension since 2021. RHC in 2021 showed PA pressure 58/16, PCWP 22. TTE in September of 2023 estimated RV systolic pressure 57.53mmHg, repeat TTE this admission with RVSP 62.39mmHg. LVEF preserved in both studies.  Prior PCWP 22 on RHC in 2021 suggestive of post capillary pulmonary hypertension. Overall, RVSP appearing mildly increased from September 2023, likely in setting of acute volume overload. Patient WHO group 2/3. Suspect that chronic left heart dysfunction has led to some pulmonary vascular resistance.   Patient clinically euvolemic on exam. Recommend optimization of HF GDMT. Would not pursue repeat RHC at this time unless patient unable to come off oxygen. Agree with initiation of Spironolactone 25mg  Patient previously intolerant of SGLT2 for DM secondary to recurrent GU infection and urine culture this admit  with E fecalis. Risks>benefit at this time  Agree with Losartan 25mg  Consider Lasix 20mg  PO at discharge with PRN extra dose for weight gain.  Lasix 60mg  BID was ordered by primary hospital team. Given hypokalemia, will repeat BMP this afternoon. If K still low, would hold Lasix. Otherwise, will plan to given IV lasix x1 additional dose, 60mg .  Could consider outpatient high resolution CT/PFTs but low suspicion for primary pulmonary disease etiology of pulmonary hypertension  Hypertension  BP stable. Continue Metoprolol Succinate 100mg , Losartan 25mg , Spironolactone 25mg .   Paroxysmal atrial fibrillation  Sinus rhythm on recent ECGs and telemetry. Continue Eliquis 5mg  BID and Metoprolol. Will need to closely follow renal function for possible need to reduce Eliquis dosing.  CAD  Patient with hx DES to LAD in 2004, DES to prox LAD and ostial RCA 2021.   Continue Plavix 75mg , Atorvastatin 40mg    Per primary team: CKD Rheumatoid arthritis DM    Risk Assessment/Risk Scores:        New York Heart Association (NYHA) Functional Class NYHA Class II  CHA2DS2-VASc Score = 7   This indicates a 11.2% annual risk of stroke. The patient's score is based upon: CHF History: 1 HTN History: 1 Diabetes History: 1 Stroke History: 0 Vascular Disease History: 1 Age Score: 2 Gender Score: 1         For questions or updates, please contact [facility] Please consult [email or web address] for contact info under    Signed, Perlie Gold, PA-C  11/02/2022 1:08 PM

## 2022-11-03 ENCOUNTER — Encounter: Payer: Self-pay | Admitting: Hematology

## 2022-11-03 ENCOUNTER — Other Ambulatory Visit (HOSPITAL_COMMUNITY): Payer: Self-pay

## 2022-11-03 ENCOUNTER — Encounter (HOSPITAL_COMMUNITY): Payer: Self-pay | Admitting: Internal Medicine

## 2022-11-03 DIAGNOSIS — I480 Paroxysmal atrial fibrillation: Secondary | ICD-10-CM | POA: Diagnosis not present

## 2022-11-03 DIAGNOSIS — I5033 Acute on chronic diastolic (congestive) heart failure: Secondary | ICD-10-CM | POA: Diagnosis not present

## 2022-11-03 LAB — URINE CULTURE

## 2022-11-03 LAB — BASIC METABOLIC PANEL
Anion gap: 13 (ref 5–15)
BUN: 22 mg/dL (ref 8–23)
CO2: 24 mmol/L (ref 22–32)
Calcium: 8.9 mg/dL (ref 8.9–10.3)
Chloride: 98 mmol/L (ref 98–111)
Creatinine, Ser: 1.39 mg/dL — ABNORMAL HIGH (ref 0.44–1.00)
GFR, Estimated: 37 mL/min — ABNORMAL LOW (ref >60)
Glucose, Bld: 113 mg/dL — ABNORMAL HIGH (ref 70–99)
Potassium: 4.2 mmol/L (ref 3.5–5.1)
Sodium: 135 mmol/L (ref 135–145)

## 2022-11-03 LAB — CBC
HCT: 40 % (ref 36.0–46.0)
Hemoglobin: 13.2 g/dL (ref 12.0–15.0)
MCH: 30.1 pg (ref 26.0–34.0)
MCHC: 33 g/dL (ref 30.0–36.0)
MCV: 91.3 fL (ref 80.0–100.0)
Platelets: 353 10*3/uL (ref 150–400)
RBC: 4.38 MIL/uL (ref 3.87–5.11)
RDW: 14.3 % (ref 11.5–15.5)
WBC: 13.3 10*3/uL — ABNORMAL HIGH (ref 4.0–10.5)
nRBC: 0 % (ref 0.0–0.2)

## 2022-11-03 LAB — GLUCOSE, CAPILLARY
Glucose-Capillary: 131 mg/dL — ABNORMAL HIGH (ref 70–99)
Glucose-Capillary: 216 mg/dL — ABNORMAL HIGH (ref 70–99)
Glucose-Capillary: 132 mg/dL — ABNORMAL HIGH (ref 70–99)
Glucose-Capillary: 226 mg/dL — ABNORMAL HIGH (ref 70–99)

## 2022-11-03 MED ORDER — FUROSEMIDE 10 MG/ML IJ SOLN
60.0000 mg | Freq: Two times a day (BID) | INTRAMUSCULAR | Status: DC
  Administered 2022-11-03 (×2): 60 mg via INTRAVENOUS
  Filled 2022-11-03 (×2): qty 6

## 2022-11-03 MED ORDER — NITROFURANTOIN MONOHYD MACRO 100 MG PO CAPS
100.0000 mg | ORAL_CAPSULE | Freq: Two times a day (BID) | ORAL | Status: DC
  Administered 2022-11-03 – 2022-11-04 (×3): 100 mg via ORAL
  Filled 2022-11-03 (×3): qty 1

## 2022-11-03 NOTE — Evaluation (Signed)
Physical Therapy Evaluation Patient Details Name: Sophia Ward MRN: [id number] DOB: [birth year] Today's Date: 11/03/2022  History of Present Illness  Sophia Ward is a 89 y.o. female presents to the emergency department with forehead laceration after a fall and increased DOE. Found to have acute on chronic diastolic CHF and acute hypoxic respiratory failure. PHMx:HTN, hyperlipidemia, DM2, RA, severe AS status post TAVR, CAD, PAF, BPPV, MI, and spinal stenosis.  Clinical Impression  Pt presents with admitting diagnosis above. Today pt was able to ambulate in hallway with no AD at supervision level. Pt received on 1L at 99%. Pt then placed on RA prior to ambulation satting 94%. Pt remained between 87%-90% on RA during ambulation with cues for pursed lip breathing. Pt briefly dropped to 81% on RA once seated however quickly recovered to 99% on RA prior to leaving room. RN made aware. Pt would benefit from 1 additional visit for stair training prior to DC. Pt likely doesn't need any post acute PT.       Recommendations for follow up therapy are one component of a multi-disciplinary discharge planning process, led by the attending physician.  Recommendations may be updated based on patient status, additional functional criteria and insurance authorization.  Follow Up Recommendations       Assistance Recommended at Discharge PRN  Patient can return home with the following  A little help with walking and/or transfers;A little help with bathing/dressing/bathroom;Assistance with cooking/housework;Assist for transportation;Help with stairs or ramp for entrance    Equipment Recommendations None recommended by PT  Recommendations for Other Services       Functional Status Assessment Patient has had a recent decline in their functional status and demonstrates the ability to make significant improvements in function in a reasonable and predictable amount of time.     Precautions /  Restrictions Precautions Precautions: Fall Precaution Comments: monitor O2 Restrictions Weight Bearing Restrictions: No      Mobility  Bed Mobility               General bed mobility comments: Pt up in chair.    Transfers Overall transfer level: Independent Equipment used: None Transfers: Sit to/from Stand Sit to Stand: Independent                Ambulation/Gait Ambulation/Gait assistance: Supervision Gait Distance (Feet): 250 Feet Assistive device: None Gait Pattern/deviations: Decreased stride length, Step-through pattern Gait velocity: decreased     General Gait Details: no LOB noted.  Stairs            Wheelchair Mobility    Modified Rankin (Stroke Patients Only)       Balance Overall balance assessment: Mild deficits observed, not formally tested                                           Pertinent Vitals/Pain Pain Assessment Pain Assessment: No/denies pain    Home Living Family/patient expects to be discharged to:: Private residence Living Arrangements: Alone Available Help at Discharge: Family;Available PRN/intermittently Type of Home: House Home Access: Stairs to enter Entrance Stairs-Rails: Can reach both Entrance Stairs-Number of Steps: 2   Home Layout: One level Home Equipment: Agricultural consultant (2 wheels);Shower seat;Grab bars - toilet;Hand held shower head;Grab bars - tub/shower      Prior Function Prior Level of Function : Independent/Modified Independent;Driving;History of Falls (last six months) (Pt reports 1 other fall  on mother's day where her foot gto caught in a luggage handle.)             Mobility Comments: Ind no AD ADLs Comments: Ind     Hand Dominance   Dominant Hand: Right    Extremity/Trunk Assessment   Upper Extremity Assessment Upper Extremity Assessment: Overall WFL for tasks assessed    Lower Extremity Assessment Lower Extremity Assessment: Overall WFL for tasks assessed     Cervical / Trunk Assessment Cervical / Trunk Assessment: Normal  Communication   Communication: No difficulties  Cognition Arousal/Alertness: Awake/alert Behavior During Therapy: WFL for tasks assessed/performed Overall Cognitive Status: Within Functional Limits for tasks assessed                                          General Comments General comments (skin integrity, edema, etc.): Pt received on 1L at 99%. Pt then placed on RA prior to ambulation satting 94%. Pt remained between 87%-90% on RA during ambulation with cues for pursed lip breathing. Pt briefly dropped to 81% on RA once seated however quickly recovered to 99% on RA prior to leaving room. RN made aware.    Exercises     Assessment/Plan    PT Assessment Patient needs continued PT services  PT Problem List Decreased activity tolerance;Decreased balance;Cardiopulmonary status limiting activity;Decreased mobility;Decreased coordination;Decreased knowledge of precautions       PT Treatment Interventions Gait training;Stair training;Functional mobility training;Therapeutic activities;Therapeutic exercise;Balance training;Neuromuscular re-education;Patient/family education    PT Goals (Current goals can be found in the Care Plan section)       Frequency Min 1X/week     Co-evaluation               AM-PAC PT "6 Clicks" Mobility  Outcome Measure Help needed turning from your back to your side while in a flat bed without using bedrails?: None Help needed moving from lying on your back to sitting on the side of a flat bed without using bedrails?: None Help needed moving to and from a bed to a chair (including a wheelchair)?: None Help needed standing up from a chair using your arms (e.g., wheelchair or bedside chair)?: None Help needed to walk in hospital room?: A Little Help needed climbing 3-5 steps with a railing? : A Little 6 Click Score: 22    End of Session Equipment Utilized During  Treatment: Gait belt Activity Tolerance: Patient tolerated treatment well Patient left: in chair;with call bell/phone within reach;with family/visitor present Nurse Communication: Mobility status PT Visit Diagnosis: Other abnormalities of gait and mobility (R26.89)    Time: 1610-9604 PT Time Calculation (min) (ACUTE ONLY): 18 min   Charges:   PT Evaluation $PT Eval Low Complexity: 1 Low PT Treatments $Gait Training: 8-22 mins        Shela Nevin, PT, DPT Acute Rehab Services [phone]   Gladys Damme 11/03/2022, 3:04 PM

## 2022-11-03 NOTE — Evaluation (Signed)
Occupational Therapy Evaluation Patient Details Name: Sophia Ward MRN: [id number] DOB: Jun 28, 1932 Today's Date: 11/03/2022   History of Present Illness Sophia Ward is a 89 y.o. female presents to the emergency department with forehead laceration after a fall and increased DOE. Found to have acute on chronic diastolic CHF and acute hypoxic respiratory failure. PHMx:HTN, hyperlipidemia, DM2, RA, severe AS status post TAVR, CAD, PAF, BPPV, MI, and spinal stenosis.   Clinical Impression   Thjs 89 yo female admitted with above presents to acute OT with PLOF of being totally independent with basic ADLs, IADLs, and driving. Currently she is limited due to her drop in O2 sats with activity and mild SOB( was moderate before admitted and starting treatment) and thus needing supplemental O2 (that she is not accustomed to). She will continue to benefit from acute OT with follow up HHOT and intermittent A.     Recommendations for follow up therapy are one component of a multi-disciplinary discharge planning process, led by the attending physician.  Recommendations may be updated based on patient status, additional functional criteria and insurance authorization.   Assistance Recommended at Discharge Intermittent Supervision/Assistance  Patient can return home with the following A little help with walking and/or transfers;A little help with bathing/dressing/bathroom;Assistance with cooking/housework;Assist for transportation;Help with stairs or ramp for entrance    Functional Status Assessment  Patient has had a recent decline in their functional status and demonstrates the ability to make significant improvements in function in a reasonable and predictable amount of time.  Equipment Recommendations  None recommended by OT       Precautions / Restrictions Precautions Precaution Comments: monitor O2 Restrictions Weight Bearing Restrictions: No      Mobility Bed Mobility Overal bed  mobility: Independent             General bed mobility comments: HOB up    Transfers Overall transfer level: Needs assistance Equipment used: None Transfers: Sit to/from Stand Sit to Stand: Min guard                  Balance Overall balance assessment: Needs assistance Sitting-balance support: No upper extremity supported, Feet supported Sitting balance-Leahy Scale: Good     Standing balance support: No upper extremity supported Standing balance-Leahy Scale: Fair                             ADL either performed or assessed with clinical judgement   ADL Overall ADL's : Needs assistance/impaired Eating/Feeding: Independent;Sitting   Grooming: Min guard;Standing Grooming Details (indicate cue type and reason): use gel to stand and santize hands Upper Body Bathing: Set up;Supervision/ safety;Sitting   Lower Body Bathing: Min guard;Sit to/from stand   Upper Body Dressing : Set up;Supervision/safety;Sitting   Lower Body Dressing: Min guard;Sit to/from stand   Toilet Transfer: Min guard;Ambulation;Regular Toilet;Grab bars   Toileting- Clothing Manipulation and Hygiene: Min guard;Sit to/from stand         General ADL Comments: initiated education in purse lipped breathing due to O2 dropping into mid 80's with activity. With purse lipped breathing she actually drops her O2 level before it goes back up.     Vision Patient Visual Report: No change from baseline              Pertinent Vitals/Pain Pain Assessment Pain Assessment: No/denies pain     Hand Dominance Right   Extremity/Trunk Assessment Upper Extremity Assessment Upper Extremity Assessment: Overall WFL for  tasks assessed           Communication Communication Communication: No difficulties   Cognition Arousal/Alertness: Awake/alert Behavior During Therapy: WFL for tasks assessed/performed Overall Cognitive Status: Within Functional Limits for tasks assessed                                                   Home Living Family/patient expects to be discharged to:: Private residence Living Arrangements: Alone Available Help at Discharge: Family;Available PRN/intermittently (can be there more often the first few days after pt is discharged home) Type of Home: House Home Access: Stairs to enter [employer] of Steps: 2 Entrance Stairs-Rails: Can reach both Home Layout: One level     Bathroom Shower/Tub: Producer, television/film/video: Handicapped height     Home Equipment: Agricultural consultant (2 wheels);Shower seat;Grab bars - toilet;Hand held shower head;Grab bars - tub/shower          Prior Functioning/Environment Prior Level of Function : Independent/Modified Independent;Driving                        OT Problem List: Cardiopulmonary status limiting activity;Impaired balance (sitting and/or standing)      OT Treatment/Interventions: Self-care/ADL training;DME and/or AE instruction;Patient/family education;Balance training;Energy conservation    OT Goals(Current goals can be found in the care plan section) Acute Rehab OT Goals Patient Stated Goal: for breathing to continue to improve OT Goal Formulation: With patient Time For Goal Achievement: 11/17/22 Potential to Achieve Goals: Good  OT Frequency: Min 1X/week       AM-PAC OT "6 Clicks" Daily Activity     Outcome Measure Help from another person eating meals?: None Help from another person taking care of personal grooming?: A Little Help from another person toileting, which includes using toliet, bedpan, or urinal?: A Little Help from another person bathing (including washing, rinsing, drying)?: A Little Help from another person to put on and taking off regular upper body clothing?: A Little Help from another person to put on and taking off regular lower body clothing?: A Little 6 Click Score: 19   End of Session Equipment Utilized During Treatment:  Gait belt (RA v 1 liter O2)  Activity Tolerance: Patient tolerated treatment well Patient left: in chair;with call bell/phone within reach;with chair alarm set  OT Visit Diagnosis: Unsteadiness on feet (R26.81)                Time: 9147-8295 OT Time Calculation (min): 34 min Charges:  OT General Charges $OT Visit: 1 Visit OT Evaluation $OT Eval Moderate Complexity: 1 Mod OT Treatments $Self Care/Home Management : 8-22 mins  Lindon Romp OT Acute Rehabilitation Services Office [phone]    Evette Georges 11/03/2022, 11:08 AM

## 2022-11-03 NOTE — Progress Notes (Addendum)
PROGRESS NOTE    Sophia Ward  UJW:[id number] DOB: 02-11-1933 DOA: 10/31/2022 PCP: Georgann Housekeeper, MD  89/F w diastolic CHF, P.Afib, hypertension, AS sp TAVR, hyperlipidemia and T2DM who presented after a mechanical fall.> head trauma with laceration over her left eye. No loss of consciousness. reported worsening dyspnea, and edema over last 4 weeks. In ED, sats 80's on ambulation, positive lower extremity edema. CXR noted cardiomegaly with bilateral hilar vascular congestion and bilateral interstitial infiltrates.   Subjective: -Feels better over all, breathing is improving  Assessment and Plan:  Acute on chronic diastolic CHF Severe Pulm HTN -Echo with EF 60 to 65%, grade 2 diastolic dysfunction, preserved RV, severely elevated PA systolic pressures,  moderate TR.  -Urine output is inaccurate, weight down 4 LB  -Continue IV Lasix 1 more day, Aldactone, metoprolol, losartan -Stopped Jardiance, urine culture growing E fecalis -Up appreciate cardiology input, pulmonary hypertension felt to be mixed precapillary and postcapillary  Acute hypoxemic respiratory failure  -due to acute cardiogenic pulmonary edema -as above, wean O2 as tolerated  Enterococcus faecalis UTI -UA and culture with E faecalis, asymptomatic initially however does report some burning and discomfort today, will start antibiotic therapy, check CBC in a.m.  Essential hypertension, benign Continue metoprolol -changed amlodipine for losartan.   PAF (paroxysmal atrial fibrillation) (HCC) Continue metoprolol and apixaban.  CKD stage 3a, GFR 45-59 ml/min (HCC) Hypomagnesemia.  -stable replaced mag  Coronary atherosclerosis of native coronary artery No chest pain.   Rheumatoid arthritis involving multiple sites with positive rheumatoid factor (HCC) Continue low dose of home prednisone.   Diabetes mellitus without complication (HCC) -SSI  Hypokalemia -Replaced  DVT prophylaxis: apixaban Code Status:  Full Code Family Communication: Daughter at bedside Disposition Plan: Home tomorrow  Consultants:    Procedures:   Antimicrobials:    Objective: Vitals:   11/02/22 2358 11/03/22 0435 11/03/22 0839 11/03/22 0953  BP: (!) 135/50 (!) 116/50  (!) 115/47  Pulse: (!) 59 (!) 58 70 (!) 59  Resp: 18 18  18   Temp: 98.1 F (36.7 C) 97.8 F (36.6 C)  98 F (36.7 C)  TempSrc: Oral Oral  Oral  SpO2: 96% 97%  97%  Weight:  79 kg    Height:        Intake/Output Summary (Last 24 hours) at 11/03/2022 1053 Last data filed at 11/03/2022 0440 Gross per 24 hour  Intake 3 ml  Output 900 ml  Net -897 ml   Filed Weights   11/01/22 1846 11/02/22 0407 11/03/22 0435  Weight: 79.7 kg 79.7 kg 79 kg    Examination:  General exam: Pleasant female sitting up in bed, AAOx3, no distress HEENT: Positive JVD CVS: S1-S2, regular rhythm Lungs: Decreased breath sounds at the bases otherwise clear Abdomen: Soft, nontender, bowel sounds present Extremities: No edema  Skin: No rashes Psychiatry:  Mood & affect appropriate.     Data Reviewed:   CBC: Recent Labs  Lab 10/31/22 1717 10/31/22 1725 11/01/22 0239 11/03/22 0155  WBC 12.3*  --  13.4* 13.3*  HGB 12.7 13.6 13.7 13.2  HCT 38.7 40.0 40.8 40.0  MCV 92.6  --  92.3 91.3  PLT 342  --  397 353   Basic Metabolic Panel: Recent Labs  Lab 10/31/22 1717 10/31/22 1725 11/01/22 0239 11/02/22 0055 11/02/22 1539 11/03/22 0155  NA 134* 136 137 135 134* 135  K 4.0 4.8 3.9 3.1* 4.6 4.2  CL 103 102 100 100 98 98  CO2 23  --  21*  25 25 24   GLUCOSE 198* 194* 101* 115* 136* 113*  BUN 20 27* 18 16 19 22   CREATININE 1.23* 1.20* 1.15* 1.06* 1.31* 1.39*  CALCIUM 8.7*  --  8.9 8.5* 8.8* 8.9  MG  --   --  1.6* 2.4  --   --    GFR: Estimated Creatinine Clearance: 27.8 mL/min (A) (by C-G formula based on SCr of 1.39 mg/dL (H)). Liver Function Tests: Recent Labs  Lab 10/31/22 1717  AST 23  ALT 24  ALKPHOS 54  BILITOT 0.6  PROT 6.8  ALBUMIN  3.2*   No results for input(s): "LIPASE", "AMYLASE" in the last 168 hours. No results for input(s): "AMMONIA" in the last 168 hours. Coagulation Profile: Recent Labs  Lab 10/31/22 1717  INR 1.3*   Cardiac Enzymes: No results for input(s): "CKTOTAL", "CKMB", "CKMBINDEX", "TROPONINI" in the last 168 hours. BNP (last 3 results) No results for input(s): "PROBNP" in the last 8760 hours. HbA1C: Recent Labs    11/01/22 0239  HGBA1C 6.4*   CBG: Recent Labs  Lab 11/02/22 0559 11/02/22 1126 11/02/22 1552 11/02/22 2100 11/03/22 0614  GLUCAP 114* 292* 135* 159* 131*   Lipid Profile: No results for input(s): "CHOL", "HDL", "LDLCALC", "TRIG", "CHOLHDL", "LDLDIRECT" in the last 72 hours. Thyroid Function Tests: No results for input(s): "TSH", "T4TOTAL", "FREET4", "T3FREE", "THYROIDAB" in the last 72 hours. Anemia Panel: No results for input(s): "VITAMINB12", "FOLATE", "FERRITIN", "TIBC", "IRON", "RETICCTPCT" in the last 72 hours. Urine analysis:    Component Value Date/Time   COLORURINE YELLOW 10/31/2022 2129   APPEARANCEUR CLOUDY (A) 10/31/2022 2129   LABSPEC 1.008 10/31/2022 2129   PHURINE 5.0 10/31/2022 2129   GLUCOSEU NEGATIVE 10/31/2022 2129   HGBUR MODERATE (A) 10/31/2022 2129   BILIRUBINUR NEGATIVE 10/31/2022 2129   KETONESUR NEGATIVE 10/31/2022 2129   PROTEINUR NEGATIVE 10/31/2022 2129   NITRITE NEGATIVE 10/31/2022 2129   LEUKOCYTESUR LARGE (A) 10/31/2022 2129   Sepsis Labs: @LABRCNTIP (procalcitonin:4,lacticidven:4)  ) Recent Results (from the past 240 hour(s))  Urine Culture     Status: Abnormal   Collection Time: 11/01/22  1:04 AM   Specimen: Urine, Clean Catch  Result Value Ref Range Status   Specimen Description URINE, CLEAN CATCH  Final   Special Requests   Final    NONE Performed at [facility], [address], [place], [place] [address]    Culture >=100,000 COLONIES/mL ENTEROCOCCUS FAECALIS (A)  Final   Report Status 11/03/2022 FINAL  Final    Organism ID, Bacteria ENTEROCOCCUS FAECALIS (A)  Final      Susceptibility   Enterococcus faecalis - MIC*    AMPICILLIN <=2 SENSITIVE Sensitive     NITROFURANTOIN <=16 SENSITIVE Sensitive     VANCOMYCIN 1 SENSITIVE Sensitive     * >=100,000 COLONIES/mL ENTEROCOCCUS FAECALIS     Radiology Studies: ECHOCARDIOGRAM COMPLETE  Result Date: 11/01/2022    ECHOCARDIOGRAM REPORT   Patient Name:   Sophia Ward Date of Exam: 11/01/2022 Medical Rec #:  [id number]          Height:       62.0 in Accession #:    [id number]         Weight:       178.0 lb Date of Birth:  01-08-1933         BSA:          1.819 m Patient Age:    89 years           BP:  99/80 mmHg Patient Gender: F                  HR:           61 bpm. Exam Location:  Inpatient Procedure: 2D Echo, Color Doppler and Cardiac Doppler Indications:    CHF  History:        Patient has prior history of Echocardiogram examinations, most                 recent 02/21/2022. CHF, CAD, Aortic Valve Disease,                 Arrythmias:Atrial Fibrillation; Risk Factors:Hypertension and                 Diabetes.  Sonographer:    Milbert Coulter Referring Phys: [id number] TIMOTHY S OPYD IMPRESSIONS  1. Left ventricular ejection fraction, by estimation, is 60 to 65%. The left ventricle has normal function. The left ventricle has no regional wall motion abnormalities. Left ventricular diastolic parameters are consistent with Grade II diastolic dysfunction (pseudonormalization).  2. Right ventricular systolic function is normal. The right ventricular size is normal. There is severely elevated pulmonary artery systolic pressure. The estimated right ventricular systolic pressure is 62.5 mmHg.  3. The mitral valve is normal in structure. No evidence of mitral valve regurgitation. No evidence of mitral stenosis. Moderate mitral annular calcification.  4. Tricuspid valve regurgitation is mild to moderate.  5. The aortic valve is very poorly visualized, however, appears  calcified. Gradients are consistent with mild-moderate stenosis.  6. The inferior vena cava is normal in size with greater than 50% respiratory variability, suggesting right atrial pressure of 3 mmHg.  7. Evidence of atrial level shunting detected by color flow Doppler. FINDINGS  Left Ventricle: Left ventricular ejection fraction, by estimation, is 60 to 65%. The left ventricle has normal function. The left ventricle has no regional wall motion abnormalities. The left ventricular internal cavity size was normal in size. There is  no left ventricular hypertrophy. Left ventricular diastolic parameters are consistent with Grade II diastolic dysfunction (pseudonormalization). Right Ventricle: The right ventricular size is normal. No increase in right ventricular wall thickness. Right ventricular systolic function is normal. There is severely elevated pulmonary artery systolic pressure. The tricuspid regurgitant velocity is 3.79 m/s, and with an assumed right atrial pressure of 5 mmHg, the estimated right ventricular systolic pressure is 62.5 mmHg. Left Atrium: Left atrial size was normal in size. Right Atrium: Right atrial size was normal in size. Pericardium: There is no evidence of pericardial effusion. Mitral Valve: The mitral valve is normal in structure. Moderate mitral annular calcification. No evidence of mitral valve regurgitation. No evidence of mitral valve stenosis. MV peak gradient, 10.0 mmHg. The mean mitral valve gradient is 4.0 mmHg. Tricuspid Valve: The tricuspid valve is normal in structure. Tricuspid valve regurgitation is mild to moderate. No evidence of tricuspid stenosis. Aortic Valve: The aortic valve was not well visualized. Aortic valve regurgitation is not visualized. Aortic valve sclerosis/calcification is present, without any evidence of aortic stenosis. Aortic valve mean gradient measures 23.2 mmHg. Aortic valve peak gradient measures 41.9 mmHg. Aortic valve area, by VTI measures 0.98 cm.  Pulmonic Valve: The pulmonic valve was normal in structure. Pulmonic valve regurgitation is mild. No evidence of pulmonic stenosis. Aorta: The aortic root is normal in size and structure. Venous: The inferior vena cava is normal in size with greater than 50% respiratory variability, suggesting right atrial pressure of 3 mmHg. IAS/Shunts:  Evidence of atrial level shunting detected by color flow Doppler.  LEFT VENTRICLE PLAX 2D LVIDd:         4.30 cm     Diastology LVIDs:         3.00 cm     LV e' medial:    5.00 cm/s LV PW:         1.00 cm     LV E/e' medial:  31.4 LV IVS:        1.00 cm     LV e' lateral:   8.49 cm/s LVOT diam:     1.80 cm     LV E/e' lateral: 18.5 LV SV:         73 LV SV Index:   40 LVOT Area:     2.54 cm  LV Volumes (MOD) LV vol d, MOD A2C: 71.7 ml LV vol d, MOD A4C: 77.6 ml LV vol s, MOD A2C: 26.2 ml LV vol s, MOD A4C: 31.3 ml LV SV MOD A2C:     45.5 ml LV SV MOD A4C:     77.6 ml LV SV MOD BP:      46.6 ml RIGHT VENTRICLE             IVC RV Basal diam:  3.30 cm     IVC diam: 1.40 cm RV Mid diam:    3.40 cm RV S prime:     13.40 cm/s TAPSE (M-mode): 1.3 cm LEFT ATRIUM             Index        RIGHT ATRIUM           Index LA diam:        3.40 cm 1.87 cm/m   RA Area:     16.10 cm LA Vol (A2C):   62.0 ml 34.08 ml/m  RA Volume:   39.40 ml  21.66 ml/m LA Vol (A4C):   46.8 ml 25.72 ml/m LA Biplane Vol: 58.9 ml 32.37 ml/m  AORTIC VALVE AV Area (Vmax):    0.87 cm AV Area (Vmean):   0.89 cm AV Area (VTI):     0.98 cm AV Vmax:           323.75 cm/s AV Vmean:          222.500 cm/s AV VTI:            0.738 m AV Peak Grad:      41.9 mmHg AV Mean Grad:      23.2 mmHg LVOT Vmax:         111.00 cm/s LVOT Vmean:        77.600 cm/s LVOT VTI:          0.285 m LVOT/AV VTI ratio: 0.39  AORTA Ao Asc diam: 2.40 cm MITRAL VALVE                TRICUSPID VALVE MV Area (PHT): 3.08 cm     TR Peak grad:   57.5 mmHg MV Area VTI:   1.27 cm     TR Vmax:        379.00 cm/s MV Peak grad:  10.0 mmHg MV Mean grad:  4.0  mmHg     SHUNTS MV Vmax:       1.58 m/s     Systemic VTI:  0.29 m MV Vmean:      95.3 cm/s    Systemic Diam: 1.80 cm MV Decel Time: 246 msec MV E velocity: 157.00 cm/s  MV A velocity: 99.70 cm/s MV E/A ratio:  1.57 Aditya Sabharwal Electronically signed by Dorthula Nettles Signature Date/Time: 11/01/2022/12:46:19 PM    Final      Scheduled Meds:  apixaban  5 mg Oral BID   atorvastatin  40 mg Oral QHS   clopidogrel  75 mg Oral Daily   escitalopram  10 mg Oral Daily   furosemide  60 mg Intravenous BID   insulin aspart  0-5 Units Subcutaneous QHS   insulin aspart  0-6 Units Subcutaneous TID WC   losartan  25 mg Oral Daily   metoprolol succinate  100 mg Oral q morning   pantoprazole  40 mg Oral Daily   predniSONE  3 mg Oral Q breakfast   sodium chloride flush  3 mL Intravenous Q12H   spironolactone  25 mg Oral Daily   Continuous Infusions:     LOS: 2 days    Time spent:    Zannie Cove, MD [facility]   11/03/2022, 10:53 AM

## 2022-11-03 NOTE — Progress Notes (Signed)
Cardiology Progress Note  Patient ID: Sophia Ward MRN: [id number] DOB: [birth year] Date of Encounter: 11/03/2022  Primary Cardiologist: Lance Muss, MD  Subjective   Chief Complaint: SOB  HPI: Still volume up.  Down to 1 L nasal cannula during my interview.  Still short of breath.  ROS:  All other ROS reviewed and negative. Pertinent positives noted in the HPI.     Inpatient Medications  Scheduled Meds:  apixaban  5 mg Oral BID   atorvastatin  40 mg Oral QHS   clopidogrel  75 mg Oral Daily   escitalopram  10 mg Oral Daily   insulin aspart  0-5 Units Subcutaneous QHS   insulin aspart  0-6 Units Subcutaneous TID WC   losartan  25 mg Oral Daily   metoprolol succinate  100 mg Oral q morning   pantoprazole  40 mg Oral Daily   predniSONE  3 mg Oral Q breakfast   sodium chloride flush  3 mL Intravenous Q12H   spironolactone  25 mg Oral Daily   Continuous Infusions:  PRN Meds: acetaminophen **OR** acetaminophen, fluticasone, ondansetron **OR** ondansetron (ZOFRAN) IV   Vital Signs   Vitals:   11/02/22 1552 11/02/22 1946 11/02/22 2358 11/03/22 0435  BP: (!) 119/52 (!) 117/46 (!) 135/50 (!) 116/50  Pulse:  60 (!) 59 (!) 58  Resp: 18 18 18 18   Temp: 98.1 F (36.7 C) 98.1 F (36.7 C) 98.1 F (36.7 C) 97.8 F (36.6 C)  TempSrc: Oral Oral Oral Oral  SpO2: 95% 94% 96% 97%  Weight:    79 kg  Height:        Intake/Output Summary (Last 24 hours) at 11/03/2022 0815 Last data filed at 11/03/2022 0440 Gross per 24 hour  Intake 3 ml  Output 900 ml  Net -897 ml      11/03/2022    4:35 AM 11/02/2022    4:07 AM 11/01/2022    6:46 PM  Last 3 Weights  Weight (lbs) 174 lb 3.2 oz 175 lb 11.2 oz 175 lb 11.3 oz  Weight (kg) 79.017 kg 79.697 kg 79.7 kg      Telemetry  Overnight telemetry shows SR 60s, which I personally reviewed.    Physical Exam   Vitals:   11/02/22 1552 11/02/22 1946 11/02/22 2358 11/03/22 0435  BP: (!) 119/52 (!) 117/46 (!) 135/50 (!) 116/50   Pulse:  60 (!) 59 (!) 58  Resp: 18 18 18 18   Temp: 98.1 F (36.7 C) 98.1 F (36.7 C) 98.1 F (36.7 C) 97.8 F (36.6 C)  TempSrc: Oral Oral Oral Oral  SpO2: 95% 94% 96% 97%  Weight:    79 kg  Height:        Intake/Output Summary (Last 24 hours) at 11/03/2022 0815 Last data filed at 11/03/2022 0440 Gross per 24 hour  Intake 3 ml  Output 900 ml  Net -897 ml       11/03/2022    4:35 AM 11/02/2022    4:07 AM 11/01/2022    6:46 PM  Last 3 Weights  Weight (lbs) 174 lb 3.2 oz 175 lb 11.2 oz 175 lb 11.3 oz  Weight (kg) 79.017 kg 79.697 kg 79.7 kg    Body mass index is 31.86 kg/m.  General: Well nourished, well developed, in no acute distress Head: Atraumatic, normal size  Eyes: PEERLA, EOMI  Neck: Supple, JVD 8 to 10 cm of water Endocrine: No thryomegaly Cardiac: Normal S1, S2; RRR; no murmurs, rubs, or gallops Lungs: Crackles  at the lung bases Abd: Soft, nontender, no hepatomegaly  Ext: No edema, pulses 2+ Musculoskeletal: No deformities, BUE and BLE strength normal and equal Skin: Warm and dry, no rashes   Neuro: Alert and oriented to person, place, time, and situation, CNII-XII grossly intact, no focal deficits  Psych: Normal mood and affect   Labs  High Sensitivity Troponin:   Recent Labs  Lab 10/31/22 2115 10/31/22 2334  TROPONINIHS 10 11     Cardiac EnzymesNo results for input(s): "TROPONINI" in the last 168 hours. No results for input(s): "TROPIPOC" in the last 168 hours.  Chemistry Recent Labs  Lab 10/31/22 1717 10/31/22 1725 11/02/22 0055 11/02/22 1539 11/03/22 0155  NA 134*   < > 135 134* 135  K 4.0   < > 3.1* 4.6 4.2  CL 103   < > 100 98 98  CO2 23   < > 25 25 24   GLUCOSE 198*   < > 115* 136* 113*  BUN 20   < > 16 19 22   CREATININE 1.23*   < > 1.06* 1.31* 1.39*  CALCIUM 8.7*   < > 8.5* 8.8* 8.9  PROT 6.8  --   --   --   --   ALBUMIN 3.2*  --   --   --   --   AST 23  --   --   --   --   ALT 24  --   --   --   --   ALKPHOS 54  --   --   --   --    BILITOT 0.6  --   --   --   --   GFRNONAA 43*   < > 51* 39* 37*  ANIONGAP 8   < > 10 11 13    < > = values in this interval not displayed.    Hematology Recent Labs  Lab 10/31/22 1717 10/31/22 1725 11/01/22 0239 11/03/22 0155  WBC 12.3*  --  13.4* 13.3*  RBC 4.18  --  4.42 4.38  HGB 12.7 13.6 13.7 13.2  HCT 38.7 40.0 40.8 40.0  MCV 92.6  --  92.3 91.3  MCH 30.4  --  31.0 30.1  MCHC 32.8  --  33.6 33.0  RDW 14.3  --  14.5 14.3  PLT 342  --  397 353   BNP Recent Labs  Lab 10/31/22 2115  BNP 309.9*    DDimer No results for input(s): "DDIMER" in the last 168 hours.   Radiology  ECHOCARDIOGRAM COMPLETE  Result Date: 11/01/2022    ECHOCARDIOGRAM REPORT   Patient Name:   Sophia Ward Date of Exam: 11/01/2022 Medical Rec #:  [id number]          Height:       62.0 in Accession #:    [id number]         Weight:       178.0 lb Date of Birth:  [birth year]         BSA:          1.819 m Patient Age:    89 years           BP:           99/80 mmHg Patient Gender: F                  HR:           61 bpm. Exam Location:  Inpatient Procedure: 2D Echo, Color Doppler  and Cardiac Doppler Indications:    CHF  History:        Patient has prior history of Echocardiogram examinations, most                 recent 02/21/2022. CHF, CAD, Aortic Valve Disease,                 Arrythmias:Atrial Fibrillation; Risk Factors:Hypertension and                 Diabetes.  Sonographer:    Milbert Coulter Referring Phys: [id number] TIMOTHY S OPYD IMPRESSIONS  1. Left ventricular ejection fraction, by estimation, is 60 to 65%. The left ventricle has normal function. The left ventricle has no regional wall motion abnormalities. Left ventricular diastolic parameters are consistent with Grade II diastolic dysfunction (pseudonormalization).  2. Right ventricular systolic function is normal. The right ventricular size is normal. There is severely elevated pulmonary artery systolic pressure. The estimated right ventricular systolic  pressure is 62.5 mmHg.  3. The mitral valve is normal in structure. No evidence of mitral valve regurgitation. No evidence of mitral stenosis. Moderate mitral annular calcification.  4. Tricuspid valve regurgitation is mild to moderate.  5. The aortic valve is very poorly visualized, however, appears calcified. Gradients are consistent with mild-moderate stenosis.  6. The inferior vena cava is normal in size with greater than 50% respiratory variability, suggesting right atrial pressure of 3 mmHg.  7. Evidence of atrial level shunting detected by color flow Doppler. FINDINGS  Left Ventricle: Left ventricular ejection fraction, by estimation, is 60 to 65%. The left ventricle has normal function. The left ventricle has no regional wall motion abnormalities. The left ventricular internal cavity size was normal in size. There is  no left ventricular hypertrophy. Left ventricular diastolic parameters are consistent with Grade II diastolic dysfunction (pseudonormalization). Right Ventricle: The right ventricular size is normal. No increase in right ventricular wall thickness. Right ventricular systolic function is normal. There is severely elevated pulmonary artery systolic pressure. The tricuspid regurgitant velocity is 3.79 m/s, and with an assumed right atrial pressure of 5 mmHg, the estimated right ventricular systolic pressure is 62.5 mmHg. Left Atrium: Left atrial size was normal in size. Right Atrium: Right atrial size was normal in size. Pericardium: There is no evidence of pericardial effusion. Mitral Valve: The mitral valve is normal in structure. Moderate mitral annular calcification. No evidence of mitral valve regurgitation. No evidence of mitral valve stenosis. MV peak gradient, 10.0 mmHg. The mean mitral valve gradient is 4.0 mmHg. Tricuspid Valve: The tricuspid valve is normal in structure. Tricuspid valve regurgitation is mild to moderate. No evidence of tricuspid stenosis. Aortic Valve: The aortic valve  was not well visualized. Aortic valve regurgitation is not visualized. Aortic valve sclerosis/calcification is present, without any evidence of aortic stenosis. Aortic valve mean gradient measures 23.2 mmHg. Aortic valve peak gradient measures 41.9 mmHg. Aortic valve area, by VTI measures 0.98 cm. Pulmonic Valve: The pulmonic valve was normal in structure. Pulmonic valve regurgitation is mild. No evidence of pulmonic stenosis. Aorta: The aortic root is normal in size and structure. Venous: The inferior vena cava is normal in size with greater than 50% respiratory variability, suggesting right atrial pressure of 3 mmHg. IAS/Shunts: Evidence of atrial level shunting detected by color flow Doppler.  LEFT VENTRICLE PLAX 2D LVIDd:         4.30 cm     Diastology LVIDs:         3.00 cm  LV e' medial:    5.00 cm/s LV PW:         1.00 cm     LV E/e' medial:  31.4 LV IVS:        1.00 cm     LV e' lateral:   8.49 cm/s LVOT diam:     1.80 cm     LV E/e' lateral: 18.5 LV SV:         73 LV SV Index:   40 LVOT Area:     2.54 cm  LV Volumes (MOD) LV vol d, MOD A2C: 71.7 ml LV vol d, MOD A4C: 77.6 ml LV vol s, MOD A2C: 26.2 ml LV vol s, MOD A4C: 31.3 ml LV SV MOD A2C:     45.5 ml LV SV MOD A4C:     77.6 ml LV SV MOD BP:      46.6 ml RIGHT VENTRICLE             IVC RV Basal diam:  3.30 cm     IVC diam: 1.40 cm RV Mid diam:    3.40 cm RV S prime:     13.40 cm/s TAPSE (M-mode): 1.3 cm LEFT ATRIUM             Index        RIGHT ATRIUM           Index LA diam:        3.40 cm 1.87 cm/m   RA Area:     16.10 cm LA Vol (A2C):   62.0 ml 34.08 ml/m  RA Volume:   39.40 ml  21.66 ml/m LA Vol (A4C):   46.8 ml 25.72 ml/m LA Biplane Vol: 58.9 ml 32.37 ml/m  AORTIC VALVE AV Area (Vmax):    0.87 cm AV Area (Vmean):   0.89 cm AV Area (VTI):     0.98 cm AV Vmax:           323.75 cm/s AV Vmean:          222.500 cm/s AV VTI:            0.738 m AV Peak Grad:      41.9 mmHg AV Mean Grad:      23.2 mmHg LVOT Vmax:         111.00 cm/s LVOT  Vmean:        77.600 cm/s LVOT VTI:          0.285 m LVOT/AV VTI ratio: 0.39  AORTA Ao Asc diam: 2.40 cm MITRAL VALVE                TRICUSPID VALVE MV Area (PHT): 3.08 cm     TR Peak grad:   57.5 mmHg MV Area VTI:   1.27 cm     TR Vmax:        379.00 cm/s MV Peak grad:  10.0 mmHg MV Mean grad:  4.0 mmHg     SHUNTS MV Vmax:       1.58 m/s     Systemic VTI:  0.29 m MV Vmean:      95.3 cm/s    Systemic Diam: 1.80 cm MV Decel Time: 246 msec MV E velocity: 157.00 cm/s MV A velocity: 99.70 cm/s MV E/A ratio:  1.57 Aditya Sabharwal Electronically signed by Dorthula Nettles Signature Date/Time: 11/01/2022/12:46:19 PM    Final     Cardiac Studies  TTE 11/01/2022  1. Left ventricular ejection fraction, by estimation, is 60 to 65%.  The  left ventricle has normal function. The left ventricle has no regional  wall motion abnormalities. Left ventricular diastolic parameters are  consistent with Grade II diastolic  dysfunction (pseudonormalization).   2. Right ventricular systolic function is normal. The right ventricular  size is normal. There is severely elevated pulmonary artery systolic  pressure. The estimated right ventricular systolic pressure is 62.5 mmHg.   3. The mitral valve is normal in structure. No evidence of mitral valve  regurgitation. No evidence of mitral stenosis. Moderate mitral annular  calcification.   4. Tricuspid valve regurgitation is mild to moderate.   5. The aortic valve is very poorly visualized, however, appears  calcified. Gradients are consistent with mild-moderate stenosis.   6. The inferior vena cava is normal in size with greater than 50%  respiratory variability, suggesting right atrial pressure of 3 mmHg.   7. Evidence of atrial level shunting detected by color flow Doppler.   Patient Profile  89 year old female with history of CAD status post PCI, paroxysmal atrial fibrillation, aortic stenosis status post TAVR, pulmonary hypertension, HFpEF, hypertension, diabetes who  was admitted on 10/31/2022 for mechanical fall. Cardiology consulted for decompensated HFpEF.   Assessment & Plan   # Acute on chronic diastolic heart failure, EF 60-65% # Pulmonary hypertension, mixed precapillary and postcapillary # Acute hypoxic restaurant failure -Admitted with mechanical fall.  Found to be volume overloaded. -CT PE study shows no PE.  Likely pulmonary edema. -Echo with grade 2 diastolic dysfunction and preserved function. -Left heart cath from 2021 reviewed.  Likely has predominantly group 2 pulmonary hypertension due to left-sided heart disease. -Still requiring oxygen.  Will give her 1 more day of Lasix 60 mg IV twice daily.  We will see if we can get her off oxygen. -Discussed with the patient that this is likely related to left-sided heart disease.  Treatment is diuresis.  She likely has some remodeling from longstanding diastolic heart failure but I do not believe she would benefit from workup for Group 1 pulmonary hypertension.  Will continue with diuresis. -Continue losartan and Aldactone.  # CAD status post PCI -No symptoms of angina.  Continue Plavix and statin therapy.  # Status post TAVR -Gradients are stable.  Likely overestimated on recent echocardiogram.  Recent CT scan with no evidence of leaflet thrombosis.  Stable in my opinion.  # Paroxysmal A-fib -Maintaining sinus rhythm.  Continue Eliquis.      For questions or updates, please contact [facility] Please consult [email or web address] for contact info under        Signed, Gerri Spore T. Flora Lipps, MD, [facility]  [facility]  11/03/2022 8:15 AM

## 2022-11-03 NOTE — Progress Notes (Signed)
Heart Failure Navigator Progress Note  Following this hospitalization to assess for HV TOC readiness.   EF 60-65% Patient currently bathing Plan for HF TOC interview  Rhae Hammock, BSN, RN Heart Failure Print production planner Chat Only

## 2022-11-03 NOTE — Progress Notes (Signed)
   Heart Failure Stewardship Pharmacist Progress Note   PCP: Georgann Housekeeper, MD PCP-Cardiologist: Lance Muss, MD    HPI:  89 yo F with PMH of CHF, CAD, HTN, HLD, RA, gout, anemia, T2DM, and aortic stenosis s/p TAVR.  Presented to the ED on 6/3 following a fall. Also reports she has had increased LE edema, weight gain, and shortness of breath for over a month. EKG demonstrates sinus rhythm. No acute findings noted on CT head or CT cervical spine. CTA chest is negative for PE but concerning for pulmonary edema. BNP 310. ECHO 6/4 showed LVEF 60-65%, no regional wall motion abnormalities, G2DD, RV normal, severely elevated PA pressure, mild to moderate TR.  Current HF Medications: Diuretic: furosemide 60 mg IV BID Beta Blocker: metoprolol XL 100 mg daily ACE/ARB/ARNI: losartan 25 mg daily MRA: spironolactone 25 mg daily  Prior to admission HF Medications: Diuretic: furosemide 20 mg daily PRN Beta blocker: metoprolol XL 100 mg daily ACE/ARB/ARNI: ramipril 10 mg BID  Pertinent Lab Values: Serum creatinine 1.39, BUN 22, Potassium 4.2, Sodium 135, BNP 309.9, Magnesium 2.4, A1c 6.4   Vital Signs: Weight: 174 lbs (admission weight: 178 lbs) Blood pressure: 110/50s  Heart rate: 50-60s  I/O: -0.3L yesterday; net -0.3L  Medication Assistance / Insurance Benefits Check: Does the patient have prescription insurance?  Yes Type of insurance plan: BCBS Medicare  Does the patient qualify for medication assistance through manufacturers or grants?   Pending Eligible grants and/or patient assistance programs: pending Medication assistance applications in progress: none  Medication assistance applications approved: none Approved medication assistance renewals will be completed by: pending  Outpatient Pharmacy:  Prior to admission outpatient pharmacy: [facility] Is the patient willing to use [facility] TOC pharmacy at discharge? Yes Is the patient willing to transition their outpatient pharmacy to  utilize a [facility] outpatient pharmacy?   No    Assessment: 1. Acute on chronic systolic CHF (LVEF 60-65%). NYHA class III symptoms. - Continue furosemide 60 mg IV BID. Strict I/Os and daily weights. Keep K>4 and Mg>2. - Continue metoprolol XL 100 mg daily - Continue losartan 25 mg daily - Agree with increasing to spironolactone 25 mg daily - Holding Jardiance with asymptomatic UTI   Plan: 1) Medication changes recommended at this time: - Agree with changes  2) Patient assistance: - Jardiance/Farxiga copay $45 - Entresto copay $45  3)  Education  - Patient has been educated on current HF medications and potential additions to HF medication regimen - Patient verbalizes understanding that over the next few months, these medication doses may change and more medications may be added to optimize HF regimen - Patient has been educated on basic disease state pathophysiology and goals of therapy   Sharen Hones, PharmD, BCPS Heart Failure Stewardship Pharmacist Phone [phone]

## 2022-11-03 NOTE — TOC CM/SW Note (Addendum)
Transition of Care [facility]) - Inpatient Brief Assessment   Patient Details  Name: Sophia Ward MRN: [id number] Date of Birth: Jul 16, 1932  Transition of Care [facility]) CM/SW Contact:    [name], RN Phone Number: 11/03/2022, 12:40 PM   Clinical Narrative: Patient is from home alone,  she has PCP  and insurance on file. She has a walker that she uses sometimes , walk in shower and rails.   She does not have any HH servces in place at this time.  , she has two daughters, Vanessa Kick is the HPOA and then Darl Pikes, one of them will transport her home at [employer] they are her support system , she gets meds from [place] on [address] in [place].  , she has  a scale , which she weighs herself in the am, she has bp cuff, and she eats a low sodium diet.  Physical therapy seeing her await recs.  NCM spoke with physical therapist, Tomie China, he states she does not need any PT follow up., they will probably check her oxygen sats prior to dc.   Transition of Care Asessment: Insurance and Status: Insurance coverage has been reviewed Patient has primary care physician: Yes Home environment has been reviewed: from home Prior level of function:: self ambulates with walker sometimes Prior/Current Home Services: No current home services Social Determinants of Health Reivew: SDOH reviewed no interventions necessary Readmission risk has been reviewed: Yes Transition of care needs: no transition of care needs at this time

## 2022-11-04 ENCOUNTER — Other Ambulatory Visit (HOSPITAL_COMMUNITY): Payer: Self-pay

## 2022-11-04 DIAGNOSIS — I5033 Acute on chronic diastolic (congestive) heart failure: Secondary | ICD-10-CM | POA: Diagnosis not present

## 2022-11-04 LAB — BASIC METABOLIC PANEL
Anion gap: 12 (ref 5–15)
BUN: 28 mg/dL — ABNORMAL HIGH (ref 8–23)
CO2: 26 mmol/L (ref 22–32)
Calcium: 9.2 mg/dL (ref 8.9–10.3)
Chloride: 97 mmol/L — ABNORMAL LOW (ref 98–111)
Creatinine, Ser: 1.55 mg/dL — ABNORMAL HIGH (ref 0.44–1.00)
GFR, Estimated: 32 mL/min — ABNORMAL LOW (ref >60)
Glucose, Bld: 98 mg/dL (ref 70–99)
Potassium: 3.7 mmol/L (ref 3.5–5.1)
Sodium: 135 mmol/L (ref 135–145)

## 2022-11-04 LAB — GLUCOSE, CAPILLARY
Glucose-Capillary: 130 mg/dL — ABNORMAL HIGH (ref 70–99)
Glucose-Capillary: 299 mg/dL — ABNORMAL HIGH (ref 70–99)

## 2022-11-04 MED ORDER — TORSEMIDE 20 MG PO TABS
20.0000 mg | ORAL_TABLET | Freq: Every day | ORAL | Status: DC
  Administered 2022-11-04: 20 mg via ORAL
  Filled 2022-11-04: qty 1

## 2022-11-04 MED ORDER — LOSARTAN POTASSIUM 25 MG PO TABS
25.0000 mg | ORAL_TABLET | Freq: Every day | ORAL | 0 refills | Status: DC
  Filled 2022-11-04: qty 30, 30d supply, fill #0

## 2022-11-04 MED ORDER — APIXABAN 5 MG PO TABS: 5.0000 mg | ORAL_TABLET | Freq: Two times a day (BID) | ORAL | Status: DC

## 2022-11-04 MED ORDER — NITROFURANTOIN MONOHYD MACRO 100 MG PO CAPS
100.0000 mg | ORAL_CAPSULE | Freq: Two times a day (BID) | ORAL | 0 refills | Status: DC
  Filled 2022-11-04: qty 4, 2d supply, fill #0

## 2022-11-04 MED ORDER — APIXABAN 2.5 MG PO TABS: 2.5000 mg | ORAL_TABLET | Freq: Two times a day (BID) | ORAL | Status: DC

## 2022-11-04 MED ORDER — TORSEMIDE 20 MG PO TABS
20.0000 mg | ORAL_TABLET | Freq: Every day | ORAL | 0 refills | Status: AC
  Filled 2022-11-04: qty 30, 30d supply, fill #0

## 2022-11-04 MED ORDER — AMOXICILLIN 500 MG PO CAPS
500.0000 mg | ORAL_CAPSULE | Freq: Two times a day (BID) | ORAL | 0 refills | Status: AC
  Filled 2022-11-04: qty 4, 2d supply, fill #0

## 2022-11-04 MED ORDER — SPIRONOLACTONE 25 MG PO TABS
25.0000 mg | ORAL_TABLET | Freq: Every day | ORAL | 0 refills | Status: AC
  Filled 2022-11-04: qty 30, 30d supply, fill #0

## 2022-11-04 NOTE — Progress Notes (Signed)
Nurse requested Mobility Specialist to perform oxygen saturation test with pt which includes removing pt from oxygen both at rest and while ambulating.  Below are the results from that testing.     Patient Saturations on Room Air at Rest = spO2 92%  Patient Saturations on Room Air while Ambulating = sp02 85% .   Patient Saturations on 1 Liters of oxygen while Ambulating = sp02 91%  At end of testing pt left in room on RA.  Reported results to nurse.   Addison Lank Mobility Specialist Please contact via SecureChat or  Rehab office at [phone]

## 2022-11-04 NOTE — Progress Notes (Signed)
   Heart Failure Stewardship Pharmacist Progress Note   PCP: Georgann Housekeeper, MD PCP-Cardiologist: Lance Muss, MD    HPI:  89 yo F with PMH of CHF, CAD, HTN, HLD, RA, gout, anemia, T2DM, and aortic stenosis s/p TAVR.  Presented to the ED on 6/3 following a fall. Also reports she has had increased LE edema, weight gain, and shortness of breath for over a month. EKG demonstrates sinus rhythm. No acute findings noted on CT head or CT cervical spine. CTA chest is negative for PE but concerning for pulmonary edema. BNP 310. ECHO 6/4 showed LVEF 60-65%, no regional wall motion abnormalities, G2DD, RV normal, severely elevated PA pressure, mild to moderate TR.  Discharge HF Medications: Diuretic: torsemide 20 mg daily Beta Blocker: metoprolol XL 100 mg daily ACE/ARB/ARNI: losartan 25 mg daily MRA: spironolactone 25 mg daily  Prior to admission HF Medications: Diuretic: furosemide 20 mg daily PRN Beta blocker: metoprolol XL 100 mg daily ACE/ARB/ARNI: ramipril 10 mg BID  Pertinent Lab Values: Serum creatinine 1.55, BUN 28, Potassium 3.7, Sodium 135, BNP 309.9, Magnesium 2.4, A1c 6.4   Vital Signs: Weight: 171 lbs (admission weight: 178 lbs) Blood pressure: 110/50s  Heart rate: 50-60s  I/O: -0.3L yesterday; net -0.3L  Medication Assistance / Insurance Benefits Check: Does the patient have prescription insurance?  Yes Type of insurance plan: BCBS Medicare  Outpatient Pharmacy:  [facility] Is the patient willing to use [facility] TOC pharmacy at discharge? Yes Is the patient willing to transition their outpatient pharmacy to utilize a [facility] outpatient pharmacy?   No    Assessment: 1. Acute on chronic systolic CHF (LVEF 60-65%). NYHA class III symptoms. - Agree with transitioning to torsemide 20 mg daily. Strict I/Os and daily weights. Keep K>4 and Mg>2. - Continue metoprolol XL 100 mg daily - Continue losartan 25 mg daily - Continue  spironolactone 25 mg daily - Holding Jardiance with asymptomatic UTI, has had history of yeast infections and bladder infections - would not resume   Plan: 1) Medication changes recommended at this time: - Agree with changes; discharge today  2) Patient assistance: - Jardiance/Farxiga copay $45 - Entresto copay $45  3)  Education  - Patient has been educated on current HF medications and potential additions to HF medication regimen - Patient verbalizes understanding that over the next few months, these medication doses may change and more medications may be added to optimize HF regimen - Patient has been educated on basic disease state pathophysiology and goals of therapy   Sharen Hones, PharmD, BCPS Heart Failure Stewardship Pharmacist Phone [phone]

## 2022-11-04 NOTE — TOC Transition Note (Addendum)
Transition of Care [facility]) - CM/SW Discharge Note   Patient Details  Name: Sophia Ward MRN: [id number] Date of Birth: [birth year]ophia Ward-1934  [facility]) CM/SW Contact:  [name], RN Phone Number: 11/04/2022, 8:59 AM   Clinical Narrative:    Patient is for dc today, daughter Sophia Ward, is at the bedside, patient will need to be checked for ambulatory saturations before she is dc to make sure she does not need any oxygen. She states she does not need HHRN , per pt eval she does not need any PT f/u.  Daughter is here to transport her home. Per ambulatory sats, she needs home oxygen, NCM offered choice, daughter states she has no preference of the agency.  NCM made referral to [facility] with [employer].  Tank will be delivered to her room and concentrator will be set up at her home.          Patient Goals and CMS Choice      Discharge Placement                         Discharge Plan and Services Additional resources added to the After Visit Summary for                                       Social Determinants of Health (SDOH) Interventions SDOH Screenings   Food Insecurity: No Food Insecurity (11/01/2022)  Housing: Low Risk  (11/03/2022)  Transportation Needs: No Transportation Needs (11/01/2022)  Utilities: Not At Risk (11/01/2022)  Alcohol Screen: Low Risk  (11/03/2022)  Financial Resource Strain: Low Risk  (11/03/2022)  Tobacco Use: Low Risk  (11/03/2022)     Readmission Risk Interventions    11/03/2022   12:39 PM 03/04/2020   12:07 PM  Readmission Risk Prevention Plan  Transportation Screening Complete Complete  PCP or Specialist Appt within 5-7 Days Complete   PCP or Specialist Appt within 3-5 Days  Complete  Home Care Screening Complete   Medication Review (RN CM) Complete   HRI or Home Care Consult  Complete  Social Work Consult for Recovery Care Planning/Counseling  Complete  Palliative Care Screening  Not Applicable  Medication Review Furniture conservator/restorer)  Complete

## 2022-11-04 NOTE — Progress Notes (Signed)
Nurse requested Mobility Specialist to perform oxygen saturation test with pt which includes removing pt from oxygen both at rest and while ambulating.  Below are the results from that testing.     Patient Saturations on Room Air at Rest = spO2 92%  Patient Saturations on Room Air while Ambulating = sp02 85% .   Patient Saturations on 1 Liters of oxygen while Ambulating = sp02 91%  At end of testing pt left in room on RA.  Reported results to nurse.   Anna Kincaid Mobility Specialist Please contact via SecureChat or  Rehab office at [phone]  

## 2022-11-04 NOTE — Progress Notes (Signed)
Cardiology Progress Note  Patient ID: Sophia Ward MRN: [id number] DOB: Dec 17, 1932 Date of Encounter: 11/04/2022  Primary Cardiologist: Lance Muss, MD  Subjective   Chief Complaint: None.   HPI: Volume status much improved.  I weaned her down to 1 L nasal cannula during my interview with saturations 96 to 98%.  ROS:  All other ROS reviewed and negative. Pertinent positives noted in the HPI.     Inpatient Medications  Scheduled Meds:  apixaban  5 mg Oral BID   atorvastatin  40 mg Oral QHS   clopidogrel  75 mg Oral Daily   escitalopram  10 mg Oral Daily   insulin aspart  0-5 Units Subcutaneous QHS   insulin aspart  0-6 Units Subcutaneous TID WC   losartan  25 mg Oral Daily   metoprolol succinate  100 mg Oral q morning   nitrofurantoin (macrocrystal-monohydrate)  100 mg Oral Q12H   pantoprazole  40 mg Oral Daily   predniSONE  3 mg Oral Q breakfast   sodium chloride flush  3 mL Intravenous Q12H   spironolactone  25 mg Oral Daily   torsemide  20 mg Oral Daily   Continuous Infusions:  PRN Meds: acetaminophen **OR** acetaminophen, fluticasone, ondansetron **OR** ondansetron (ZOFRAN) IV   Vital Signs   Vitals:   11/03/22 1935 11/03/22 2040 11/03/22 2312 11/04/22 0413  BP: (!) 121/52  (!) 121/54 (!) 121/46  Pulse: 69  63 64  Resp: 18  18 18   Temp: 98.1 F (36.7 C)  97.9 F (36.6 C) 98.1 F (36.7 C)  TempSrc: Oral  Oral Oral  SpO2: 90% 98% 98% 92%  Weight:    77.9 kg  Height:        Intake/Output Summary (Last 24 hours) at 11/04/2022 0814 Last data filed at 11/04/2022 0809 Gross per 24 hour  Intake 719 ml  Output 450 ml  Net 269 ml      11/04/2022    4:13 AM 11/03/2022    4:35 AM 11/02/2022    4:07 AM  Last 3 Weights  Weight (lbs) 171 lb 11.8 oz 174 lb 3.2 oz 175 lb 11.2 oz  Weight (kg) 77.9 kg 79.017 kg 79.697 kg      Telemetry  Overnight telemetry shows SR 60s, which I personally reviewed.   Physical Exam   Vitals:   11/03/22 1935 11/03/22 2040  11/03/22 2312 11/04/22 0413  BP: (!) 121/52  (!) 121/54 (!) 121/46  Pulse: 69  63 64  Resp: 18  18 18   Temp: 98.1 F (36.7 C)  97.9 F (36.6 C) 98.1 F (36.7 C)  TempSrc: Oral  Oral Oral  SpO2: 90% 98% 98% 92%  Weight:    77.9 kg  Height:        Intake/Output Summary (Last 24 hours) at 11/04/2022 0814 Last data filed at 11/04/2022 0809 Gross per 24 hour  Intake 719 ml  Output 450 ml  Net 269 ml       11/04/2022    4:13 AM 11/03/2022    4:35 AM 11/02/2022    4:07 AM  Last 3 Weights  Weight (lbs) 171 lb 11.8 oz 174 lb 3.2 oz 175 lb 11.2 oz  Weight (kg) 77.9 kg 79.017 kg 79.697 kg    Body mass index is 31.41 kg/m.  General: Well nourished, well developed, in no acute distress Head: Atraumatic, normal size  Eyes: PEERLA, EOMI  Neck: Supple, no JVD Endocrine: No thryomegaly Cardiac: Normal S1, S2; RRR; no murmurs, rubs,  or gallops Lungs: Diminished breath sounds at the lung bases Abd: Soft, nontender, no hepatomegaly  Ext: No edema, pulses 2+ Musculoskeletal: No deformities, BUE and BLE strength normal and equal Skin: Warm and dry, no rashes   Neuro: Alert and oriented to person, place, time, and situation, CNII-XII grossly intact, no focal deficits  Psych: Normal mood and affect   Labs  High Sensitivity Troponin:   Recent Labs  Lab 10/31/22 2115 10/31/22 2334  TROPONINIHS 10 11     Cardiac EnzymesNo results for input(s): "TROPONINI" in the last 168 hours. No results for input(s): "TROPIPOC" in the last 168 hours.  Chemistry Recent Labs  Lab 10/31/22 1717 10/31/22 1725 11/02/22 1539 11/03/22 0155 11/04/22 0100  NA 134*   < > 134* 135 135  K 4.0   < > 4.6 4.2 3.7  CL 103   < > 98 98 97*  CO2 23   < > 25 24 26   GLUCOSE 198*   < > 136* 113* 98  BUN 20   < > 19 22 28*  CREATININE 1.23*   < > 1.31* 1.39* 1.55*  CALCIUM 8.7*   < > 8.8* 8.9 9.2  PROT 6.8  --   --   --   --   ALBUMIN 3.2*  --   --   --   --   AST 23  --   --   --   --   ALT 24  --   --   --   --    ALKPHOS 54  --   --   --   --   BILITOT 0.6  --   --   --   --   GFRNONAA 43*   < > 39* 37* 32*  ANIONGAP 8   < > 11 13 12    < > = values in this interval not displayed.    Hematology Recent Labs  Lab 10/31/22 1717 10/31/22 1725 11/01/22 0239 11/03/22 0155  WBC 12.3*  --  13.4* 13.3*  RBC 4.18  --  4.42 4.38  HGB 12.7 13.6 13.7 13.2  HCT 38.7 40.0 40.8 40.0  MCV 92.6  --  92.3 91.3  MCH 30.4  --  31.0 30.1  MCHC 32.8  --  33.6 33.0  RDW 14.3  --  14.5 14.3  PLT 342  --  397 353   BNP Recent Labs  Lab 10/31/22 2115  BNP 309.9*    DDimer No results for input(s): "DDIMER" in the last 168 hours.   Radiology  No results found.  Cardiac Studies  TTE 11/01/2022  1. Left ventricular ejection fraction, by estimation, is 60 to 65%. The  left ventricle has normal function. The left ventricle has no regional  wall motion abnormalities. Left ventricular diastolic parameters are  consistent with Grade II diastolic  dysfunction (pseudonormalization).   2. Right ventricular systolic function is normal. The right ventricular  size is normal. There is severely elevated pulmonary artery systolic  pressure. The estimated right ventricular systolic pressure is 62.5 mmHg.   3. The mitral valve is normal in structure. No evidence of mitral valve  regurgitation. No evidence of mitral stenosis. Moderate mitral annular  calcification.   4. Tricuspid valve regurgitation is mild to moderate.   5. The aortic valve is very poorly visualized, however, appears  calcified. Gradients are consistent with mild-moderate stenosis.   6. The inferior vena cava is normal in size with greater than 50%  respiratory variability,  suggesting right atrial pressure of 3 mmHg.   7. Evidence of atrial level shunting detected by color flow Doppler.   Patient Profile  89 year old female with history of CAD status post PCI, paroxysmal atrial fibrillation, aortic stenosis status post TAVR, pulmonary hypertension,  HFpEF, hypertension, diabetes who was admitted on 10/31/2022 for mechanical fall. Cardiology consulted for decompensated HFpEF.   Assessment & Plan   # Acute on chronic diastolic heart failure, EF 60-65% #Pulmonary hypertension, mixed precapillary and postcapillary # Acute hypoxic respiratory failure -Volume status much improved.  Can be transition to torsemide 20 mg daily today.  No further IV diuresis recommended. -Oxygen level down to 1 L nasal cannula.  Suspect this could be weaned off. -Pulmonary hypertension is largely due to left-sided heart disease.  Treatment is diuretics.  I do not believe she would benefit from evaluation for Group 1 pulmonary hypertension medications.  Will continue with conservative approach given her age.  This is in agreement with the patient as well. -Continue losartan and Aldactone.  # CAD status post PCI -On Plavix and Eliquis.  On statin.  No symptoms of angina.  # Status post TAVR -Gradients are stable, likely overestimated on recent echocardiogram.  Recent CT scan with no evidence of obstruction.  # Paroxysmal atrial fibrillation -Continue Eliquis.  Maintaining sinus rhythm.  [facility] will sign off.   Medication Recommendations: As above Other recommendations (labs, testing, etc): None Follow up as an outpatient: She has heart failure follow-up on 11/21/2022  For questions or updates, please contact [facility] Please consult [email or web address] for contact info under        Signed, Gerri Spore T. Flora Lipps, MD, [facility] [place]  [facility]  11/04/2022 8:14 AM

## 2022-11-04 NOTE — Discharge Summary (Signed)
Physician Discharge Summary  Sophia Ward ZOX:[id number] DOB: Jul 15, 1932 DOA: 10/31/2022  PCP: Georgann Housekeeper, MD  Admit date: 10/31/2022 Discharge date: 11/04/2022  Time spent: 35 minutes  Recommendations for Outpatient Follow-up:  [facility]F to[facility] 6/24  Cardiology Dr.Varanasi in 1 month   Discharge Diagnoses:  Principal Problem:   Acute on chronic diastolic CHF (congestive heart failure) (HCC) Active Problems:   Essential hypertension, benign   PAF (paroxysmal atrial fibrillation) (HCC)   CKD stage 3a, GFR 45-59 ml/min (HCC)   Coronary atherosclerosis of native coronary artery   Rheumatoid arthritis involving multiple sites with positive rheumatoid factor (HCC)   Diabetes mellitus without complication [facility])   Discharge Condition: improved  Diet recommendation: low na  Filed Weights   11/02/22 0407 11/03/22 0435 11/04/22 0413  Weight: 79.7 kg 79 kg 77.9 kg    History of present illness:  89/F w diastolic CHF, P.Afib, hypertension, AS sp TAVR, hyperlipidemia and T2DM who presented after a mechanical fall.> head trauma with laceration over her left eye. No loss of consciousness. reported worsening dyspnea, and edema over last 4 weeks. In ED, sats 80's on ambulation, positive lower extremity edema. CXR noted cardiomegaly with bilateral hilar vascular congestion and bilateral interstitial infiltrates.   Hospital Course:   Acute on chronic diastolic CHF Severe Pulm HTN -Echo with EF 60 to 65%, grade 2 diastolic dysfunction, preserved RV, severely elevated PA systolic pressures,  moderate TR.  -Urine output is inaccurate, weight down 4 LB -Clinically improved and stable, transition to oral Lasix, Aldactone, losartan and metoprolol  -Stopped Jardiance, urine culture growing E fecalis -appreciate cardiology input, pulmonary hypertension felt to be mixed precapillary and postcapillary -Discharged home in a stable condition, follow-up arranged   Acute hypoxemic respiratory  failure  -due to acute cardiogenic pulmonary edema -Weaned off O2 at rest however did desat with activity, set up with home O2 at discharge yes they   Enterococcus faecalis UTI -UA and culture with E faecalis, asymptomatic initially however does report some burning and discomfort today, discussed with pharmacy, although has report of penicillin allergy with rash has tolerated amoxicillin in the past hence treated with amoxicillin, to be continued for 2 more days   Essential hypertension, benign Continue metoprolol -changed amlodipine for losartan.    PAF (paroxysmal atrial fibrillation) (HCC) Continue metoprolol and apixaban.   CKD stage 3a, GFR 45-59 ml/min (HCC) Hypomagnesemia.  -stable replaced mag   Coronary atherosclerosis of native coronary artery No chest pain.    Rheumatoid arthritis involving multiple sites with positive rheumatoid factor (HCC) Continue low dose of home prednisone.    Diabetes mellitus without complication (HCC) -SSI   Hypokalemia -Replaced  Consultations: Cards Dr.O'Neal  Discharge Exam: Vitals:   11/04/22 0815 11/04/22 0915  BP: (!) 121/57   Pulse: 74   Resp: 18   Temp: 98.2 F (36.8 C)   SpO2: 93% 92%    General exam: Pleasant female sitting up in bed, AAOx3, no distress HEENT: Positive JVD CVS: S1-S2, regular rhythm Lungs: Decreased breath sounds at the bases otherwise clear Abdomen: Soft, nontender, bowel sounds present Extremities: No edema  Skin: No rashes Psychiatry:  Mood & affect appropriate. Discharge Instructions   Discharge Instructions     Diet - low sodium heart healthy   Complete by: As directed    Discharge wound care:   Complete by: As directed    routine   Increase activity slowly   Complete by: As directed       Allergies as  of 11/04/2022       Reactions   Codeine Nausea And Vomiting   MAKES HER SICK   Glimepiride Other (See Comments)   Can't remember   Jardiance [empagliflozin] Other (See Comments)    Yeast   Metformin And Related Diarrhea   Penicillins Rash        Medication List     STOP taking these medications    amLODipine 10 MG tablet Commonly known as: NORVASC   furosemide 20 MG tablet Commonly known as: LASIX   ramipril 10 MG tablet Commonly known as: ALTACE       TAKE these medications    amoxicillin 500 MG capsule Commonly known as: AMOXIL Take 1 capsule (500 mg total) by mouth 2 (two) times daily for 2 days.   atorvastatin 40 MG tablet Commonly known as: LIPITOR Take 1 tablet (40 mg total) by mouth at bedtime.   B-D ULTRAFINE III SHORT PEN 31G X 8 MM Misc Generic drug: Insulin Pen Needle 1 each by Other route in the morning, at noon, in the evening, and at bedtime.   CALCIUM + D PO Take 1 tablet by mouth daily.   clopidogrel 75 MG tablet Commonly known as: PLAVIX Take 4 tablets (300 mg) by mouth this evening, then take 1 tablet (75 mg) by mouth daily What changed:  how much to take how to take this when to take this additional instructions   desonide 0.05 % cream Commonly known as: DESOWEN Apply 1 application  topically 2 (two) times daily as needed.   Eliquis 5 MG Tabs tablet Generic drug: apixaban Take 1 tablet by mouth twice daily   escitalopram 5 MG tablet Commonly known as: LEXAPRO Take 10 mg by mouth daily.   estradiol 0.1 MG/GM vaginal cream Commonly known as: ESTRACE Place 1 Applicatorful vaginally 2 (two) times a week.   ferrous sulfate 325 (65 FE) MG tablet Take 1 tablet (325 mg total) by mouth 2 (two) times daily with a meal.   FISH OIL PO Take 1 capsule by mouth daily.   folic acid 1 MG tablet Commonly known as: FOLVITE Take 1 tablet by mouth once daily   gabapentin 100 MG capsule Commonly known as: NEURONTIN Take 200 mg by mouth at bedtime.   insulin lispro 100 UNIT/ML KiwkPen Commonly known as: HUMALOG Inject 6 Units into the skin 3 (three) times daily.   Levemir FlexTouch 100 UNIT/ML FlexPen Generic  drug: insulin detemir Inject 8 Units into the skin every morning.   losartan 25 MG tablet Commonly known as: COZAAR Take 1 tablet (25 mg total) by mouth daily.   methotrexate 2.5 MG tablet Commonly known as: RHEUMATREX TAKE 3 TABLETS BY MOUTH ONCE A WEEK. CAUTION: CHEMOTHERAPY. PROTECT FROM LIGHT.   metoprolol succinate 100 MG 24 hr tablet Commonly known as: TOPROL-XL TAKE 1 TABLET BY MOUTH IN THE MORNING   nitroGLYCERIN 0.4 MG SL tablet Commonly known as: NITROSTAT DISSOLVE ONE TABLET UNDER THE TONGUE EVERY 5 MINUTES AS NEEDED FOR CHEST PAIN.  DO NOT EXCEED A TOTAL OF 3 DOSES IN 15 MINUTES   pantoprazole 40 MG tablet Commonly known as: PROTONIX Take 1 tablet (40 mg total) by mouth daily after lunch.   predniSONE 1 MG tablet Commonly known as: DELTASONE Take 3 tablets (3 mg total) by mouth daily.   spironolactone 25 MG tablet Commonly known as: ALDACTONE Take 1 tablet (25 mg total) by mouth daily.   torsemide 20 MG tablet Commonly known as: DEMADEX Take 1  tablet (20 mg total) by mouth daily.   Vitamin D3 125 MCG (5000 UT) Caps Take 5,000 Units by mouth daily.               Durable Medical Equipment  (From admission, onward)           Start     Ordered   11/04/22 0954  For home use only DME oxygen  Once       Question Answer Comment  Length of Need 6 Months   Mode or (Route) Nasal cannula   Liters per Minute 2   Oxygen conserving device Yes   Oxygen delivery system Gas      11/04/22 0953              Discharge Care Instructions  (From admission, onward)           Start     Ordered   11/04/22 0000  Discharge wound care:       Comments: routine   11/04/22 0840           Allergies  Allergen Reactions   Codeine Nausea And Vomiting    MAKES HER SICK   Glimepiride Other (See Comments)    Can't remember   Jardiance [Empagliflozin] Other (See Comments)    Yeast   Metformin And Related Diarrhea   Penicillins Rash    Follow-up  Information      [facility]. Go in 17 day(s).   Specialty: Cardiology Why: Hospital follow up 11/21/2022 @ 12 noon PLEASE bring a current medication list to appointment FREE valet parking, Entrance C, off [address] information: [address] [id number] [place] [address] [phone]        Georgann Housekeeper, MD. Go on 11/17/2022.   Specialty: Internal Medicine Why: @9 :15am Contact information: [address] [place] [address] [phone]         [facility] Follow up.   Why: home oxygen Contact information: [phone]                 The results of significant diagnostics from this hospitalization (including imaging, microbiology, ancillary and laboratory) are listed below for reference.    Significant Diagnostic Studies: ECHOCARDIOGRAM COMPLETE  Result Date: 11/01/2022    ECHOCARDIOGRAM REPORT   Patient Name:   Sophia Ward Date of Exam: 11/01/2022 Medical Rec #:  [id number]          Height:       62.0 in Accession #:    [id number]         Weight:       178.0 lb Date of Birth:  06-28-1932         BSA:          1.819 m Patient Age:    89 years           BP:           99/80 mmHg Patient Gender: F                  HR:           61 bpm. Exam Location:  Inpatient Procedure: 2D Echo, Color Doppler and Cardiac Doppler Indications:    CHF  History:        Patient has prior history of Echocardiogram examinations, most                 recent 02/21/2022. CHF,  CAD, Aortic Valve Disease,                 Arrythmias:Atrial Fibrillation; Risk Factors:Hypertension and                 Diabetes.  Sonographer:    Milbert Coulter Referring Phys: [id number] TIMOTHY S OPYD IMPRESSIONS  1. Left ventricular ejection fraction, by estimation, is 60 to 65%. The left ventricle has normal function. The left ventricle has no regional wall motion abnormalities. Left ventricular diastolic parameters are  consistent with Grade II diastolic dysfunction (pseudonormalization).  2. Right ventricular systolic function is normal. The right ventricular size is normal. There is severely elevated pulmonary artery systolic pressure. The estimated right ventricular systolic pressure is 62.5 mmHg.  3. The mitral valve is normal in structure. No evidence of mitral valve regurgitation. No evidence of mitral stenosis. Moderate mitral annular calcification.  4. Tricuspid valve regurgitation is mild to moderate.  5. The aortic valve is very poorly visualized, however, appears calcified. Gradients are consistent with mild-moderate stenosis.  6. The inferior vena cava is normal in size with greater than 50% respiratory variability, suggesting right atrial pressure of 3 mmHg.  7. Evidence of atrial level shunting detected by color flow Doppler. FINDINGS  Left Ventricle: Left ventricular ejection fraction, by estimation, is 60 to 65%. The left ventricle has normal function. The left ventricle has no regional wall motion abnormalities. The left ventricular internal cavity size was normal in size. There is  no left ventricular hypertrophy. Left ventricular diastolic parameters are consistent with Grade II diastolic dysfunction (pseudonormalization). Right Ventricle: The right ventricular size is normal. No increase in right ventricular wall thickness. Right ventricular systolic function is normal. There is severely elevated pulmonary artery systolic pressure. The tricuspid regurgitant velocity is 3.79 m/s, and with an assumed right atrial pressure of 5 mmHg, the estimated right ventricular systolic pressure is 62.5 mmHg. Left Atrium: Left atrial size was normal in size. Right Atrium: Right atrial size was normal in size. Pericardium: There is no evidence of pericardial effusion. Mitral Valve: The mitral valve is normal in structure. Moderate mitral annular calcification. No evidence of mitral valve regurgitation. No evidence of mitral  valve stenosis. MV peak gradient, 10.0 mmHg. The mean mitral valve gradient is 4.0 mmHg. Tricuspid Valve: The tricuspid valve is normal in structure. Tricuspid valve regurgitation is mild to moderate. No evidence of tricuspid stenosis. Aortic Valve: The aortic valve was not well visualized. Aortic valve regurgitation is not visualized. Aortic valve sclerosis/calcification is present, without any evidence of aortic stenosis. Aortic valve mean gradient measures 23.2 mmHg. Aortic valve peak gradient measures 41.9 mmHg. Aortic valve area, by VTI measures 0.98 cm. Pulmonic Valve: The pulmonic valve was normal in structure. Pulmonic valve regurgitation is mild. No evidence of pulmonic stenosis. Aorta: The aortic root is normal in size and structure. Venous: The inferior vena cava is normal in size with greater than 50% respiratory variability, suggesting right atrial pressure of 3 mmHg. IAS/Shunts: Evidence of atrial level shunting detected by color flow Doppler.  LEFT VENTRICLE PLAX 2D LVIDd:         4.30 cm     Diastology LVIDs:         3.00 cm     LV e' medial:    5.00 cm/s LV PW:         1.00 cm     LV E/e' medial:  31.4 LV IVS:        1.00 cm  LV e' lateral:   8.49 cm/s LVOT diam:     1.80 cm     LV E/e' lateral: 18.5 LV SV:         73 LV SV Index:   40 LVOT Area:     2.54 cm  LV Volumes (MOD) LV vol d, MOD A2C: 71.7 ml LV vol d, MOD A4C: 77.6 ml LV vol s, MOD A2C: 26.2 ml LV vol s, MOD A4C: 31.3 ml LV SV MOD A2C:     45.5 ml LV SV MOD A4C:     77.6 ml LV SV MOD BP:      46.6 ml RIGHT VENTRICLE             IVC RV Basal diam:  3.30 cm     IVC diam: 1.40 cm RV Mid diam:    3.40 cm RV S prime:     13.40 cm/s TAPSE (M-mode): 1.3 cm LEFT ATRIUM             Index        RIGHT ATRIUM           Index LA diam:        3.40 cm 1.87 cm/m   RA Area:     16.10 cm LA Vol (A2C):   62.0 ml 34.08 ml/m  RA Volume:   39.40 ml  21.66 ml/m LA Vol (A4C):   46.8 ml 25.72 ml/m LA Biplane Vol: 58.9 ml 32.37 ml/m  AORTIC VALVE AV  Area (Vmax):    0.87 cm AV Area (Vmean):   0.89 cm AV Area (VTI):     0.98 cm AV Vmax:           323.75 cm/s AV Vmean:          222.500 cm/s AV VTI:            0.738 m AV Peak Grad:      41.9 mmHg AV Mean Grad:      23.2 mmHg LVOT Vmax:         111.00 cm/s LVOT Vmean:        77.600 cm/s LVOT VTI:          0.285 m LVOT/AV VTI ratio: 0.39  AORTA Ao Asc diam: 2.40 cm MITRAL VALVE                TRICUSPID VALVE MV Area (PHT): 3.08 cm     TR Peak grad:   57.5 mmHg MV Area VTI:   1.27 cm     TR Vmax:        379.00 cm/s MV Peak grad:  10.0 mmHg MV Mean grad:  4.0 mmHg     SHUNTS MV Vmax:       1.58 m/s     Systemic VTI:  0.29 m MV Vmean:      95.3 cm/s    Systemic Diam: 1.80 cm MV Decel Time: 246 msec MV E velocity: 157.00 cm/s MV A velocity: 99.70 cm/s MV E/A ratio:  1.57 Aditya Sabharwal Electronically signed by Dorthula Nettles Signature Date/Time: 11/01/2022/12:46:19 PM    Final    CT Angio Chest PE W and/or Wo Contrast  Result Date: 11/01/2022 CLINICAL DATA:  Pulmonary embolus suspected with high probability. EXAM: CT ANGIOGRAPHY CHEST WITH CONTRAST TECHNIQUE: Multidetector CT imaging of the chest was performed using the standard protocol during bolus administration of intravenous contrast. Multiplanar CT image reconstructions and MIPs were obtained to evaluate the vascular anatomy. RADIATION DOSE REDUCTION:  This exam was performed according to the departmental dose-optimization program which includes automated exposure control, adjustment of the mA and/or kV according to patient size and/or use of iterative reconstruction technique. CONTRAST:  75mL OMNIPAQUE IOHEXOL 350 MG/ML SOLN COMPARISON:  02/07/2020 FINDINGS: Cardiovascular: Technically adequate study with good opacification of the central and segmental pulmonary arteries. No focal filling defects. No evidence of significant pulmonary embolus. Mild cardiac enlargement. Aortic valve prosthesis. Calcification in the mitral valve annulus. Calcification of the  aorta and coronary arteries. No aortic aneurysm. Mediastinum/Nodes: Small esophageal hiatal hernia. Esophagus is decompressed. Thyroid gland is unremarkable. Prominent mediastinal lymph nodes with left aortopulmonic wall nodes measuring up to 1.2 cm short axis dimension. Lymph nodes are similar to prior study, likely reactive. Lungs/Pleura: Mosaic attenuation pattern to the lungs with scattered interstitial septal thickening and ground-glass infiltrates throughout. Changes are likely due to edema but could indicate multifocal or atypical pneumonia in the appropriate clinical setting. No pleural effusions. No pneumothorax. Upper Abdomen: No acute abnormalities demonstrated. Musculoskeletal: Degenerative changes in the spine. Review of the MIP images confirms the above findings. IMPRESSION: 1. No evidence of significant pulmonary embolus. 2. Diffuse interstitial and alveolar infiltrates throughout the lungs likely representing edema. Multifocal or atypical pneumonia could also have this appearance in the appropriate setting. 3. Small esophageal hiatal hernia. 4. Aortic atherosclerosis. Electronically Signed   By: Burman Nieves M.D.   On: 11/01/2022 00:24   CT CERVICAL SPINE WO CONTRAST  Result Date: 10/31/2022 CLINICAL DATA:  Trauma EXAM: CT CERVICAL SPINE WITHOUT CONTRAST TECHNIQUE: Multidetector CT imaging of the cervical spine was performed without intravenous contrast. Multiplanar CT image reconstructions were also generated. RADIATION DOSE REDUCTION: This exam was performed according to the departmental dose-optimization program which includes automated exposure control, adjustment of the mA and/or kV according to patient size and/or use of iterative reconstruction technique. COMPARISON:  Cervical spine CT 04/18/2020 FINDINGS: Alignment: Normal. Skull base and vertebrae: No acute fracture. No primary bone lesion or focal pathologic process. Soft tissues and spinal canal: No prevertebral fluid or swelling.  No visible canal hematoma. Disc levels: There is moderate disc space narrowing and endplate osteophyte formation at C3-C4, C4-C5 and C5-C6 compatible with degenerative change. There is no significant central canal or neural foraminal stenosis at any level. There is mild central canal stenosis at C5-C6. Upper chest: Negative. Other: None. IMPRESSION: 1. No acute fracture or traumatic subluxation of the cervical spine. 2. Moderate degenerative changes of the cervical spine. Electronically Signed   By: Darliss Cheney M.D.   On: 10/31/2022 18:40   CT HEAD WO CONTRAST  Result Date: 10/31/2022 CLINICAL DATA:  Trauma EXAM: CT HEAD WITHOUT CONTRAST TECHNIQUE: Contiguous axial images were obtained from the base of the skull through the vertex without intravenous contrast. RADIATION DOSE REDUCTION: This exam was performed according to the departmental dose-optimization program which includes automated exposure control, adjustment of the mA and/or kV according to patient size and/or use of iterative reconstruction technique. COMPARISON:  Head CT 04/18/2020 FINDINGS: Brain: No evidence of acute infarction, hemorrhage, hydrocephalus, extra-axial collection or mass lesion/mass effect. There is an old lacunar infarct in the right basal ganglia, unchanged. Vascular: Atherosclerotic calcifications are present within the cavernous internal carotid arteries. Skull: Normal. Negative for fracture or focal lesion. Sinuses/Orbits: No acute finding. Other: None. IMPRESSION: No acute intracranial process. Electronically Signed   By: Darliss Cheney M.D.   On: 10/31/2022 18:37   DG Pelvis Portable  Result Date: 10/31/2022 CLINICAL DATA:  Fall on blood  thinners EXAM: PORTABLE PELVIS 1 VIEWS COMPARISON:  None Available. FINDINGS: There is no evidence of pelvic fracture or diastasis. No pelvic bone lesions are seen. IMPRESSION: No acute fracture or dislocation. Electronically Signed   By: Agustin Cree M.D.   On: 10/31/2022 17:38   DG Chest  Port 1 View  Result Date: 10/31/2022 CLINICAL DATA:  Fall on thinners EXAM: PORTABLE CHEST 1 VIEW COMPARISON:  Chest radiograph dated 02/28/2020 FINDINGS: Normal lung volumes. No focal consolidations. No pleural effusion or pneumothorax. Mildly enlarged cardiomediastinal silhouette status post aortic valve replacement. No radiographic finding of acute displaced fracture. IMPRESSION: 1. No radiographic finding of acute displaced fracture. 2. Mildly enlarged cardiomediastinal silhouette status post aortic valve replacement. Electronically Signed   By: Agustin Cree M.D.   On: 10/31/2022 17:37    Microbiology: Recent Results (from the past 240 hour(s))  Urine Culture     Status: Abnormal   Collection Time: 11/01/22  1:04 AM   Specimen: Urine, Clean Catch  Result Value Ref Range Status   Specimen Description URINE, CLEAN CATCH  Final   Special Requests   Final    NONE Performed at [facility], [address], [place], [place] [address]    Culture >=100,000 COLONIES/mL ENTEROCOCCUS FAECALIS (A)  Final   Report Status 11/03/2022 FINAL  Final   Organism ID, Bacteria ENTEROCOCCUS FAECALIS (A)  Final      Susceptibility   Enterococcus faecalis - MIC*    AMPICILLIN <=2 SENSITIVE Sensitive     NITROFURANTOIN <=16 SENSITIVE Sensitive     VANCOMYCIN 1 SENSITIVE Sensitive     * >=100,000 COLONIES/mL ENTEROCOCCUS FAECALIS     Labs: Basic Metabolic Panel: Recent Labs  Lab 11/01/22 0239 11/02/22 0055 11/02/22 1539 11/03/22 0155 11/04/22 0100  NA 137 135 134* 135 135  K 3.9 3.1* 4.6 4.2 3.7  CL 100 100 98 98 97*  CO2 21* 25 25 24 26   GLUCOSE 101* 115* 136* 113* 98  BUN 18 16 19 22  28*  CREATININE 1.15* 1.06* 1.31* 1.39* 1.55*  CALCIUM 8.9 8.5* 8.8* 8.9 9.2  MG 1.6* 2.4  --   --   --    Liver Function Tests: Recent Labs  Lab 10/31/22 1717  AST 23  ALT 24  ALKPHOS 54  BILITOT 0.6  PROT 6.8  ALBUMIN 3.2*   No results for input(s): "LIPASE", "AMYLASE" in the last 168 hours. No  results for input(s): "AMMONIA" in the last 168 hours. CBC: Recent Labs  Lab 10/31/22 1717 10/31/22 1725 11/01/22 0239 11/03/22 0155  WBC 12.3*  --  13.4* 13.3*  HGB 12.7 13.6 13.7 13.2  HCT 38.7 40.0 40.8 40.0  MCV 92.6  --  92.3 91.3  PLT 342  --  397 353   Cardiac Enzymes: No results for input(s): "CKTOTAL", "CKMB", "CKMBINDEX", "TROPONINI" in the last 168 hours. BNP: BNP (last 3 results) Recent Labs    10/31/22 2115  BNP 309.9*    ProBNP (last 3 results) No results for input(s): "PROBNP" in the last 8760 hours.  CBG: Recent Labs  Lab 11/03/22 1201 11/03/22 1710 11/03/22 2120 11/04/22 0611 11/04/22 1103  GLUCAP 216* 132* 226* 130* 299*       Signed:  Zannie Cove MD.  [facility] 11/04/2022, 11:12 AM

## 2022-11-04 NOTE — Progress Notes (Signed)
Mobility Specialist Progress Note:   11/04/22 0920  Mobility  Activity Ambulated with assistance in hallway  Level of Assistance Standby assist, set-up cues, supervision of patient - no hands on  Assistive Device None  Distance Ambulated (ft) 400 ft  Activity Response Tolerated well  Mobility Referral Yes  $Mobility charge 1 Mobility  Mobility Specialist Start Time (ACUTE ONLY) 0920  Mobility Specialist Stop Time (ACUTE ONLY) 0931  Mobility Specialist Time Calculation (min) (ACUTE ONLY) 11 min   Pt agreeable to mobility session. Required no physical assistance throughout session. 1LO2 required to maintain SpO2 >88%. Pt left sitting in chair with all needs met, on RA.  Addison Lank Mobility Specialist Please contact via SecureChat or  Rehab office at [phone]

## 2022-11-04 NOTE — Progress Notes (Signed)
Occupational Therapy Treatment and Discharge Patient Details Name: Sophia Ward MRN: [id number] DOB: 06-07-1932 Today's Date: 11/04/2022   History of present illness Sophia Ward is a 89 y.o. female presents to the emergency department with forehead laceration after a fall and increased DOE. Found to have acute on chronic diastolic CHF and acute hypoxic respiratory failure. PHMx:HTN, hyperlipidemia, DM2, RA, severe AS status post TAVR, CAD, PAF, BPPV, MI, and spinal stenosis.   OT comments  This 89 yo female reports she is feeling better today breathing wise than yesterday, but can still feel a bit SOB with longer distances. All education completed with pt and dtr in room on energy conservation and monitoring her O2 levels. No further acute OT needs we will sign off.  Pt had just finished with mobility team and they reported her sats dropped in to mid 80's on RA with ambulation. At rest in room after they had finished pt sats were 94% on RA at rest.   Recommendations for follow up therapy are one component of a multi-disciplinary discharge planning process, led by the attending physician.  Recommendations may be updated based on patient status, additional functional criteria and insurance authorization.    Assistance Recommended at Discharge Intermittent Supervision/Assistance  Patient can return home with the following  Assistance with cooking/housework;Assist for transportation   Equipment Recommendations  None recommended by OT       Precautions / Restrictions Precautions Precautions: Fall Precaution Comments: monitor O2 Restrictions Weight Bearing Restrictions: No       Mobility Bed Mobility               General bed mobility comments: Pt up in chair.           ADL either performed or assessed with clinical judgement   ADL                                         General ADL Comments: Continued education on energy conservation both  from a purse lipped standpoint and use of inspriometer she has at home as well as went over the energy conservation handout with her and dtr in room. Dtr was asking about when patient would know when she could stop using O2--she reports pt does have a pulse ox at home. I recommended she check her O2 at rest and with activity (ie: gather items for dressing on RA, check O2, get UB dressed on RA, check O2, get LB dressed on RA, check O2) and also to document these to show her MD.    Extremity/Trunk Assessment Upper Extremity Assessment Upper Extremity Assessment: Overall WFL for tasks assessed            Vision Patient Visual Report: No change from baseline            Cognition Arousal/Alertness: Awake/alert Behavior During Therapy: WFL for tasks assessed/performed Overall Cognitive Status: Within Functional Limits for tasks assessed                                                   Frequency  Min 1X/week        Progress Toward Goals  OT Goals(current goals can now be found in the care plan section)  Progress towards OT goals:  Progressing toward goals  Acute Rehab OT Goals Patient Stated Goal: to go home today OT Goal Formulation: With patient Time For Goal Achievement: 11/17/22 Potential to Achieve Goals: Good  Plan Discharge plan remains appropriate       AM-PAC OT "6 Clicks" Daily Activity     Outcome Measure   Help from another person eating meals?: None Help from another person taking care of personal grooming?: None Help from another person toileting, which includes using toliet, bedpan, or urinal?: None Help from another person bathing (including washing, rinsing, drying)?: None Help from another person to put on and taking off regular upper body clothing?: None Help from another person to put on and taking off regular lower body clothing?: None 6 Click Score: 24    End of Session    OT Visit Diagnosis: Unsteadiness on feet (R26.81)    Activity Tolerance Patient tolerated treatment well   Patient Left in chair;with call bell/phone within reach   Nurse Communication  (RN OK'd for telemetry to come off so pt could get dressed)        Time: 1610-9604 OT Time Calculation (min): 24 min  Charges: OT General Charges $OT Visit: 1 Visit OT Treatments $Self Care/Home Management : 23-37 mins  Lindon Romp OT Acute Rehabilitation Services Office [phone]    Evette Georges 11/04/2022, 11:59 AM

## 2022-11-04 NOTE — Care Management Important Message (Signed)
Important Message  Patient Details  Name: Sophia Ward MRN: [id number] Date of Birth: 1932-10-19   Medicare Important Message Given:  Yes     Renie Ora 11/04/2022, 8:53 AM

## 2022-11-08 DIAGNOSIS — E119 Type 2 diabetes mellitus without complications: Secondary | ICD-10-CM | POA: Diagnosis not present

## 2022-11-08 DIAGNOSIS — Z4802 Encounter for removal of sutures: Secondary | ICD-10-CM | POA: Diagnosis not present

## 2022-11-09 NOTE — Progress Notes (Signed)
Heart Failure Nurse Navigator Progress Note  PCP: Georgann Housekeeper, MD PCP-Cardiologist: Eldridge Dace Admission Diagnosis: Fall Admitted from: Home   Presentation:   Sophia Ward presented after falling forward in her chair reaching down, and fell hitting her head. Suffered a laceration to forehead, patient also reported BLE edema, weight gain, and shortness of breath. Reports to taking her lasix as prescribed. BNP 309, EKG shows sinus rhythm, no acute findings on CT of head or CTA.   Patient was educated on the sign and symptoms of heart failure, daily weights, when to call her doctor, or go to the ED, Diet/ fluid restrictions, taking all medications as prescribed and attending all medical appointments. Patient verbalized her understanding, a HF TOC appointment was scheduled for 11/21/2022 @ 12 noon.   ECHO/ LVEF: 60-65%  Clinical Course:  Past Medical History:  Diagnosis Date   Allergic rhinitis    Anemia 09/2019   Arthritis    hands   BPPV (benign paroxysmal positional vertigo)    Coronary artery disease    Diabetes mellitus without complication (HCC)    Essential hypertension, benign 01/30/2014   GERD (gastroesophageal reflux disease)    Hypertension    Lesion of pancreas    Mixed hyperlipidemia 01/30/2014   Myocardial infarction (HCC) 2004   mild MI-no damage- had stents   Osteopenia    Post-menopausal    Pulmonary nodules    Rheumatoid arthritis (HCC)    S/P TAVR (transcatheter aortic valve replacement) 03/03/2020   s/p TAVR with a 23 mm Edwards S3U via the TF approach by Drs Excell Seltzer and Cornelius Moras   Severe aortic stenosis    Spinal stenosis    Stenosing tenosynovitis      Social History   Socioeconomic History   Marital status: Widowed    Spouse name: Not on file   Number of children: Not on file   Years of education: Not on file   Highest education level: High school graduate  Occupational History   Occupation: Retired  Tobacco Use   Smoking status: Never    Passive  exposure: Never   Smokeless tobacco: Never  Vaping Use   Vaping Use: Never used  Substance and Sexual Activity   Alcohol use: No   Drug use: No   Sexual activity: Not Currently  Other Topics Concern   Not on file  Social History Narrative   Not on file   Social Determinants of Health   Financial Resource Strain: Low Risk  (11/03/2022)   Overall Financial Resource Strain (CARDIA)    Difficulty of Paying Living Expenses: Not very hard  Food Insecurity: No Food Insecurity (11/01/2022)   Hunger Vital Sign    Worried About Running Out of Food in the Last Year: Never true    Ran Out of Food in the Last Year: Never true  Transportation Needs: No Transportation Needs (11/01/2022)   PRAPARE - Administrator, Civil Service (Medical): No    Lack of Transportation (Non-Medical): No  Physical Activity: Not on file  Stress: Not on file  Social Connections: Not on file   High Risk Criteria for Readmission and/or Poor Patient Outcomes: Heart failure hospital admissions (last 6 months): 1  No Show rate: 0 Difficult social situation: No Demonstrates medication adherence: Yes Primary Language: English Literacy level: Reading, writing and comprehension  Barriers of Care:   Diet/ fluids Daily weights  Considerations/Referrals:   Referral made to Heart Failure Pharmacist Stewardship: Yes Referral made to Heart Failure CSW/NCM TOC: No  Referral made to Heart & Vascular TOC clinic: Yes, per Dr. Jomarie Longs 11/21/2022 @ 12 noon  Items for Follow-up on DC/TOC: Diet/ fluids restrictions/ Daily weights Continued HF education   Rhae Hammock, BSN, RN Heart Failure Print production planner Chat Only

## 2022-11-10 NOTE — Progress Notes (Signed)
Office Visit Note  Patient: Sophia Ward             Date of Birth: Mar 14, 1933           MRN: [id number]             PCP: Georgann Housekeeper, MD Referring: Georgann Housekeeper, MD Visit Date: 11/24/2022 Occupation: @GUAROCC @  Subjective:  Medication management  History of Present Illness: Sophia Ward is a 89 y.o. female with history of rheumatoid arthritis, osteoarthritis degenerative disc disease and osteopenia.  Patient was recently hospitalized in the first week of June after a fall.  She had a laceration over her left eye.  Chest x-ray revealed cardiomegaly with bilateral hilar vascular congestion and bilateral interstitial infiltrates.  Echocardiogram was consistent with diastolic dysfunction.  She was diuresed while in the hospital and her symptoms improved.  She required oxygen which was weaned off after the cardiogenic pulmonary edema improved.  She also developed urinary tract infection due to Enterococcus faecalis.  She was treated with amoxicillin.  She continues to be on methotrexate 2.5 mg, 3 tablets p.o. weekly along with folic acid.  She also has been taking prednisone 3 mg p.o. daily.  Her recent GFR has been low for most likely due to diuresis.  Denies any increased joint pain or joint swelling.  Her symptoms are well-controlled on methotrexate.    Activities of Daily Living:  Patient reports morning stiffness for 30 minutes.   Patient Denies nocturnal pain.  Difficulty dressing/grooming: Denies Difficulty climbing stairs: Reports Difficulty getting out of chair: Denies Difficulty using hands for taps, buttons, cutlery, and/or writing: Reports  Review of Systems  Constitutional:  Positive for fatigue.  HENT:  Positive for mouth dryness. Negative for mouth sores.   Eyes:  Negative for dryness.  Respiratory:  Positive for shortness of breath.   Cardiovascular:  Negative for chest pain and palpitations.  Gastrointestinal:  Positive for diarrhea. Negative for blood  in stool and constipation.  Endocrine: Positive for increased urination.  Genitourinary:  Negative for involuntary urination.  Musculoskeletal:  Positive for joint pain, gait problem, joint pain, muscle weakness and morning stiffness. Negative for joint swelling, myalgias, muscle tenderness and myalgias.  Skin:  Positive for sensitivity to sunlight. Negative for color change, rash and hair loss.  Allergic/Immunologic: Positive for susceptible to infections.  Neurological:  Negative for dizziness and headaches.  Hematological:  Negative for swollen glands.  Psychiatric/Behavioral:  Negative for depressed mood and sleep disturbance. The patient is not nervous/anxious.     PMFS History:  Patient Active Problem List   Diagnosis Date Noted   Acute on chronic diastolic CHF (congestive heart failure) (HCC) 11/01/2022   CKD stage 3a, GFR 45-59 ml/min (HCC) 11/01/2022   PAF (paroxysmal atrial fibrillation) (HCC) 11/01/2022   Heart failure (HCC) 11/01/2022   Acute on chronic diastolic heart failure (HCC) 03/04/2020   S/P TAVR (transcatheter aortic valve replacement) 03/03/2020   Diabetes mellitus without complication (HCC)    Pulmonary nodules    Lesion of pancreas    Severe aortic stenosis    Pressure injury of skin 01/15/2020   Acute respiratory failure with hypoxia (HCC) 01/13/2020   Iron deficiency anemia 01/06/2020   Anemia 10/22/2019   Polymyalgia rheumatica (HCC) 11/25/2016   Primary osteoarthritis of both hands 11/25/2016   Bilateral primary osteoarthritis of knee 11/25/2016   Osteopenia of multiple sites 11/25/2016   Chronic gout involving toe without tophus 10/28/2016   Osteoarthritis of lumbar spine 10/28/2016  History of gastroesophageal reflux (GERD) 10/27/2016   Rheumatoid arthritis involving multiple sites with positive rheumatoid factor (HCC) 10/14/2016   Coronary atherosclerosis of native coronary artery 01/30/2014   Mixed hyperlipidemia 01/30/2014   Essential  hypertension, benign 01/30/2014   Obesity, unspecified 01/30/2014    Past Medical History:  Diagnosis Date   Allergic rhinitis    Anemia 09/2019   Arthritis    hands   BPPV (benign paroxysmal positional vertigo)    Coronary artery disease    Diabetes mellitus without complication (HCC)    Essential hypertension, benign 01/30/2014   GERD (gastroesophageal reflux disease)    Hypertension    Lesion of pancreas    Mixed hyperlipidemia 01/30/2014   Myocardial infarction (HCC) 2004   mild MI-no damage- had stents   Osteopenia    Post-menopausal    Pulmonary nodules    Rheumatoid arthritis (HCC)    S/P TAVR (transcatheter aortic valve replacement) 03/03/2020   s/p TAVR with a 23 mm Edwards S3U via the TF approach by Drs Excell Seltzer and Cornelius Moras   Severe aortic stenosis    Spinal stenosis    Stenosing tenosynovitis     Family History  Problem Relation Age of Onset   CAD Mother    Heart attack Mother    CAD Father    Heart Problems Brother        open heart surgery    Arthritis Daughter        psoriatic arthritis    Past Surgical History:  Procedure Laterality Date   ABDOMINAL HYSTERECTOMY     APPENDECTOMY     BIOPSY  10/25/2019   Procedure: BIOPSY;  Surgeon: Kathi Der, MD;  Location: [facility];  Service: Gastroenterology;;   BIOPSY  10/26/2019   Procedure: BIOPSY;  Surgeon: Kathi Der, MD;  Location: [facility];  Service: Gastroenterology;;   CARDIAC CATHETERIZATION  2004   carpel tunnel     COLONOSCOPY WITH PROPOFOL N/A 10/30/2012   Procedure: COLONOSCOPY WITH PROPOFOL;  Surgeon: Charolett Bumpers, MD;  Location: [facility];  Service: Endoscopy;  Laterality: N/A;   COLONOSCOPY WITH PROPOFOL N/A 10/26/2019   Procedure: COLONOSCOPY WITH PROPOFOL;  Surgeon: Kathi Der, MD;  Location: [facility];  Service: Gastroenterology;  Laterality: N/A;   CORONARY ANGIOPLASTY  2004   CORONARY ATHERECTOMY N/A 02/21/2020   Procedure: CORONARY ATHERECTOMY;  Surgeon: Corky Crafts, MD;  Location: [facility] INVASIVE CV LAB;  Service: Cardiovascular;  Laterality: N/A;   CORONARY PRESSURE/FFR STUDY N/A 01/29/2020   Procedure: INTRAVASCULAR PRESSURE WIRE/FFR STUDY;  Surgeon: Corky Crafts, MD;  Location: [facility] INVASIVE CV LAB;  Service: Cardiovascular;  Laterality: N/A;   CORONARY STENT INTERVENTION N/A 02/21/2020   Procedure: CORONARY STENT INTERVENTION;  Surgeon: Corky Crafts, MD;  Location: [facility] INVASIVE CV LAB;  Service: Cardiovascular;  Laterality: N/A;   CORONARY ULTRASOUND/IVUS N/A 02/21/2020   Procedure: Intravascular Ultrasound/IVUS;  Surgeon: Corky Crafts, MD;  Location: [facility] INVASIVE CV LAB;  Service: Cardiovascular;  Laterality: N/A;   DILATION AND CURETTAGE OF UTERUS     ESOPHAGOGASTRODUODENOSCOPY N/A 10/25/2019   Procedure: ESOPHAGOGASTRODUODENOSCOPY (EGD);  Surgeon: Kathi Der, MD;  Location: [facility] ENDOSCOPY;  Service: Gastroenterology;  Laterality: N/A;   ESOPHAGOGASTRODUODENOSCOPY (EGD) WITH PROPOFOL N/A 10/30/2012   Procedure: ESOPHAGOGASTRODUODENOSCOPY (EGD) WITH PROPOFOL;  Surgeon: Charolett Bumpers, MD;  Location: [facility]OSCOPY;  Service: Endoscopy;  Laterality: N/A;   EYE SURGERY     bilateral cataract with lens implant   EYE SURGERY     INJECTION OF  SILICONE OIL Left 02/12/2019   Procedure: Injection Of Silicone Oil;  Surgeon: Carmela Rima, MD;  Location: [facility] OR;  Service: Ophthalmology;  Laterality: Left;   left shouler surgery     PHOTOCOAGULATION WITH LASER Left 02/12/2019   Procedure: Photocoagulation With Laser;  Surgeon: Carmela Rima, MD;  Location: [facility] OR;  Service: Ophthalmology;  Laterality: Left;   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 02/12/2019   Procedure: REPAIR OF COMPLEX TRACTION RETINAL DETACHMENT;  Surgeon: Carmela Rima, MD;  Location: [facility] OR;  Service: Ophthalmology;  Laterality: Left;   RIGHT/LEFT HEART CATH AND CORONARY ANGIOGRAPHY N/A 01/29/2020   Procedure: RIGHT/LEFT HEART CATH AND CORONARY  ANGIOGRAPHY;  Surgeon: Corky Crafts, MD;  Location: [facility] INVASIVE CV LAB;  Service: Cardiovascular;  Laterality: N/A;   TEE WITHOUT CARDIOVERSION N/A 03/03/2020   Procedure: TRANSESOPHAGEAL ECHOCARDIOGRAM (TEE);  Surgeon: Tonny Bollman, MD;  Location: [facility] OR;  Service: Open Heart Surgery;  Laterality: N/A;   TONSILLECTOMY     TRANSCATHETER AORTIC VALVE REPLACEMENT, TRANSFEMORAL  03/03/2020   TRANSCATHETER AORTIC VALVE REPLACEMENT, TRANSFEMORAL N/A 03/03/2020   Procedure: TRANSCATHETER AORTIC VALVE REPLACEMENT, TRANSFEMORAL USING EDWARDS SAPIEN 3 23 MM AORTIC VALVE.;  Surgeon: Tonny Bollman, MD;  Location: [facility] OR;  Service: Open Heart Surgery;  Laterality: N/A;   VITRECTOMY 25 GAUGE WITH SCLERAL BUCKLE Left 02/12/2019   Procedure: Vitrectomy 25 Gauge With Scleral Buckle;  Surgeon: Carmela Rima, MD;  Location: [facility] OR;  Service: Ophthalmology;  Laterality: Left;   Social History   Social History Narrative   Not on file   Immunization History  Administered Date(s) Administered   Fluad Quad(high Dose 65+) 03/04/2020   Influenza, High Dose Seasonal PF 03/09/2014, 04/09/2015, 03/16/2016, 02/25/2017, 03/20/2018   PFIZER(Purple Top)SARS-COV-2 Vaccination 06/11/2019, 07/01/2019, 03/19/2020   Tdap 10/31/2022   Zoster Recombinat (Shingrix) 02/07/2018, 06/04/2018     Objective: Vital Signs: BP 96/61 (BP Location: Left Arm, Patient Position: Sitting, Cuff Size: Large)   Pulse 66   Resp 14   Ht 5\' 2"  (1.575 m)   Wt 172 lb 12.8 oz (78.4 kg)   BMI 31.61 kg/m    Physical Exam Vitals and nursing note reviewed.  Constitutional:      Appearance: She is well-developed.  HENT:     Head: Normocephalic and atraumatic.  Eyes:     Conjunctiva/sclera: Conjunctivae normal.  Cardiovascular:     Rate and Rhythm: Normal rate and regular rhythm.     Heart sounds: Normal heart sounds.  Pulmonary:     Effort: Pulmonary effort is normal.     Breath sounds: Normal breath sounds.  Abdominal:      General: Bowel sounds are normal.     Palpations: Abdomen is soft.  Musculoskeletal:     Cervical back: Normal range of motion.  Lymphadenopathy:     Cervical: No cervical adenopathy.  Skin:    General: Skin is warm and dry.     Capillary Refill: Capillary refill takes less than 2 seconds.  Neurological:     Mental Status: She is alert and oriented to person, place, and time.  Psychiatric:        Behavior: Behavior normal.      Musculoskeletal Exam: Cervical spine was not good range of motion.  She had thoracic kyphosis.  There was no tenderness over thoracic or lumbar region.  Shoulder joints, elbow joints, wrist joints, MCPs PIPs and DIPs were in good range of motion with no synovitis.  Bilateral CMC PIP and DIP thickening with no synovitis  was noted.  Hip joints could not be assessed in the seated position.  Knee joints were in good range of motion.  No effusion or warmth was noted.  There was no tenderness over ankles or MTPs.  CDAI Exam: CDAI Score: -- Patient Global: 10 / 100; Provider Global: 10 / 100 Swollen: --; Tender: -- Joint Exam 11/24/2022   No joint exam has been documented for this visit   There is currently no information documented on the homunculus. Go to the Rheumatology activity and complete the homunculus joint exam.  Investigation: No additional findings.  Imaging: ECHOCARDIOGRAM COMPLETE  Result Date: 11/01/2022    ECHOCARDIOGRAM REPORT   Patient Name:   Sophia Ward Date of Exam: 11/01/2022 Medical Rec #:  [id number]          Height:       62.0 in Accession #:    [id number]         Weight:       178.0 lb Date of Birth:  08-21-1932         BSA:          1.819 m Patient Age:    89 years           BP:           99/80 mmHg Patient Gender: F                  HR:           61 bpm. Exam Location:  Inpatient Procedure: 2D Echo, Color Doppler and Cardiac Doppler Indications:    CHF  History:        Patient has prior history of Echocardiogram examinations, most                  recent 02/21/2022. CHF, CAD, Aortic Valve Disease,                 Arrythmias:Atrial Fibrillation; Risk Factors:Hypertension and                 Diabetes.  Sonographer:    Milbert Coulter Referring Phys: [id number] TIMOTHY S OPYD IMPRESSIONS  1. Left ventricular ejection fraction, by estimation, is 60 to 65%. The left ventricle has normal function. The left ventricle has no regional wall motion abnormalities. Left ventricular diastolic parameters are consistent with Grade II diastolic dysfunction (pseudonormalization).  2. Right ventricular systolic function is normal. The right ventricular size is normal. There is severely elevated pulmonary artery systolic pressure. The estimated right ventricular systolic pressure is 62.5 mmHg.  3. The mitral valve is normal in structure. No evidence of mitral valve regurgitation. No evidence of mitral stenosis. Moderate mitral annular calcification.  4. Tricuspid valve regurgitation is mild to moderate.  5. The aortic valve is very poorly visualized, however, appears calcified. Gradients are consistent with mild-moderate stenosis.  6. The inferior vena cava is normal in size with greater than 50% respiratory variability, suggesting right atrial pressure of 3 mmHg.  7. Evidence of atrial level shunting detected by color flow Doppler. FINDINGS  Left Ventricle: Left ventricular ejection fraction, by estimation, is 60 to 65%. The left ventricle has normal function. The left ventricle has no regional wall motion abnormalities. The left ventricular internal cavity size was normal in size. There is  no left ventricular hypertrophy. Left ventricular diastolic parameters are consistent with Grade II diastolic dysfunction (pseudonormalization). Right Ventricle: The right ventricular size is normal. No increase in right ventricular wall thickness. Right ventricular  systolic function is normal. There is severely elevated pulmonary artery systolic pressure. The tricuspid regurgitant  velocity is 3.79 m/s, and with an assumed right atrial pressure of 5 mmHg, the estimated right ventricular systolic pressure is 62.5 mmHg. Left Atrium: Left atrial size was normal in size. Right Atrium: Right atrial size was normal in size. Pericardium: There is no evidence of pericardial effusion. Mitral Valve: The mitral valve is normal in structure. Moderate mitral annular calcification. No evidence of mitral valve regurgitation. No evidence of mitral valve stenosis. MV peak gradient, 10.0 mmHg. The mean mitral valve gradient is 4.0 mmHg. Tricuspid Valve: The tricuspid valve is normal in structure. Tricuspid valve regurgitation is mild to moderate. No evidence of tricuspid stenosis. Aortic Valve: The aortic valve was not well visualized. Aortic valve regurgitation is not visualized. Aortic valve sclerosis/calcification is present, without any evidence of aortic stenosis. Aortic valve mean gradient measures 23.2 mmHg. Aortic valve peak gradient measures 41.9 mmHg. Aortic valve area, by VTI measures 0.98 cm. Pulmonic Valve: The pulmonic valve was normal in structure. Pulmonic valve regurgitation is mild. No evidence of pulmonic stenosis. Aorta: The aortic root is normal in size and structure. Venous: The inferior vena cava is normal in size with greater than 50% respiratory variability, suggesting right atrial pressure of 3 mmHg. IAS/Shunts: Evidence of atrial level shunting detected by color flow Doppler.  LEFT VENTRICLE PLAX 2D LVIDd:         4.30 cm     Diastology LVIDs:         3.00 cm     LV e' medial:    5.00 cm/s LV PW:         1.00 cm     LV E/e' medial:  31.4 LV IVS:        1.00 cm     LV e' lateral:   8.49 cm/s LVOT diam:     1.80 cm     LV E/e' lateral: 18.5 LV SV:         73 LV SV Index:   40 LVOT Area:     2.54 cm  LV Volumes (MOD) LV vol d, MOD A2C: 71.7 ml LV vol d, MOD A4C: 77.6 ml LV vol s, MOD A2C: 26.2 ml LV vol s, MOD A4C: 31.3 ml LV SV MOD A2C:     45.5 ml LV SV MOD A4C:     77.6 ml LV SV MOD  BP:      46.6 ml RIGHT VENTRICLE             IVC RV Basal diam:  3.30 cm     IVC diam: 1.40 cm RV Mid diam:    3.40 cm RV S prime:     13.40 cm/s TAPSE (M-mode): 1.3 cm LEFT ATRIUM             Index        RIGHT ATRIUM           Index LA diam:        3.40 cm 1.87 cm/m   RA Area:     16.10 cm LA Vol (A2C):   62.0 ml 34.08 ml/m  RA Volume:   39.40 ml  21.66 ml/m LA Vol (A4C):   46.8 ml 25.72 ml/m LA Biplane Vol: 58.9 ml 32.37 ml/m  AORTIC VALVE AV Area (Vmax):    0.87 cm AV Area (Vmean):   0.89 cm AV Area (VTI):     0.98 cm AV Vmax:  323.75 cm/s AV Vmean:          222.500 cm/s AV VTI:            0.738 m AV Peak Grad:      41.9 mmHg AV Mean Grad:      23.2 mmHg LVOT Vmax:         111.00 cm/s LVOT Vmean:        77.600 cm/s LVOT VTI:          0.285 m LVOT/AV VTI ratio: 0.39  AORTA Ao Asc diam: 2.40 cm MITRAL VALVE                TRICUSPID VALVE MV Area (PHT): 3.08 cm     TR Peak grad:   57.5 mmHg MV Area VTI:   1.27 cm     TR Vmax:        379.00 cm/s MV Peak grad:  10.0 mmHg MV Mean grad:  4.0 mmHg     SHUNTS MV Vmax:       1.58 m/s     Systemic VTI:  0.29 m MV Vmean:      95.3 cm/s    Systemic Diam: 1.80 cm MV Decel Time: 246 msec MV E velocity: 157.00 cm/s MV A velocity: 99.70 cm/s MV E/A ratio:  1.57 Aditya Sabharwal Electronically signed by Dorthula Nettles Signature Date/Time: 11/01/2022/12:46:19 PM    Final    CT Angio Chest PE W and/or Wo Contrast  Result Date: 11/01/2022 CLINICAL DATA:  Pulmonary embolus suspected with high probability. EXAM: CT ANGIOGRAPHY CHEST WITH CONTRAST TECHNIQUE: Multidetector CT imaging of the chest was performed using the standard protocol during bolus administration of intravenous contrast. Multiplanar CT image reconstructions and MIPs were obtained to evaluate the vascular anatomy. RADIATION DOSE REDUCTION: This exam was performed according to the departmental dose-optimization program which includes automated exposure control, adjustment of the mA and/or kV  according to patient size and/or use of iterative reconstruction technique. CONTRAST:  75mL OMNIPAQUE IOHEXOL 350 MG/ML SOLN COMPARISON:  02/07/2020 FINDINGS: Cardiovascular: Technically adequate study with good opacification of the central and segmental pulmonary arteries. No focal filling defects. No evidence of significant pulmonary embolus. Mild cardiac enlargement. Aortic valve prosthesis. Calcification in the mitral valve annulus. Calcification of the aorta and coronary arteries. No aortic aneurysm. Mediastinum/Nodes: Small esophageal hiatal hernia. Esophagus is decompressed. Thyroid gland is unremarkable. Prominent mediastinal lymph nodes with left aortopulmonic wall nodes measuring up to 1.2 cm short axis dimension. Lymph nodes are similar to prior study, likely reactive. Lungs/Pleura: Mosaic attenuation pattern to the lungs with scattered interstitial septal thickening and ground-glass infiltrates throughout. Changes are likely due to edema but could indicate multifocal or atypical pneumonia in the appropriate clinical setting. No pleural effusions. No pneumothorax. Upper Abdomen: No acute abnormalities demonstrated. Musculoskeletal: Degenerative changes in the spine. Review of the MIP images confirms the above findings. IMPRESSION: 1. No evidence of significant pulmonary embolus. 2. Diffuse interstitial and alveolar infiltrates throughout the lungs likely representing edema. Multifocal or atypical pneumonia could also have this appearance in the appropriate setting. 3. Small esophageal hiatal hernia. 4. Aortic atherosclerosis. Electronically Signed   By: Burman Nieves M.D.   On: 11/01/2022 00:24   CT CERVICAL SPINE WO CONTRAST  Result Date: 10/31/2022 CLINICAL DATA:  Trauma EXAM: CT CERVICAL SPINE WITHOUT CONTRAST TECHNIQUE: Multidetector CT imaging of the cervical spine was performed without intravenous contrast. Multiplanar CT image reconstructions were also generated. RADIATION DOSE REDUCTION:  This exam was performed according to  the departmental dose-optimization program which includes automated exposure control, adjustment of the mA and/or kV according to patient size and/or use of iterative reconstruction technique. COMPARISON:  Cervical spine CT 04/18/2020 FINDINGS: Alignment: Normal. Skull base and vertebrae: No acute fracture. No primary bone lesion or focal pathologic process. Soft tissues and spinal canal: No prevertebral fluid or swelling. No visible canal hematoma. Disc levels: There is moderate disc space narrowing and endplate osteophyte formation at C3-C4, C4-C5 and C5-C6 compatible with degenerative change. There is no significant central canal or neural foraminal stenosis at any level. There is mild central canal stenosis at C5-C6. Upper chest: Negative. Other: None. IMPRESSION: 1. No acute fracture or traumatic subluxation of the cervical spine. 2. Moderate degenerative changes of the cervical spine. Electronically Signed   By: Darliss Cheney M.D.   On: 10/31/2022 18:40   CT HEAD WO CONTRAST  Result Date: 10/31/2022 CLINICAL DATA:  Trauma EXAM: CT HEAD WITHOUT CONTRAST TECHNIQUE: Contiguous axial images were obtained from the base of the skull through the vertex without intravenous contrast. RADIATION DOSE REDUCTION: This exam was performed according to the departmental dose-optimization program which includes automated exposure control, adjustment of the mA and/or kV according to patient size and/or use of iterative reconstruction technique. COMPARISON:  Head CT 04/18/2020 FINDINGS: Brain: No evidence of acute infarction, hemorrhage, hydrocephalus, extra-axial collection or mass lesion/mass effect. There is an old lacunar infarct in the right basal ganglia, unchanged. Vascular: Atherosclerotic calcifications are present within the cavernous internal carotid arteries. Skull: Normal. Negative for fracture or focal lesion. Sinuses/Orbits: No acute finding. Other: None. IMPRESSION: No acute  intracranial process. Electronically Signed   By: Darliss Cheney M.D.   On: 10/31/2022 18:37   DG Pelvis Portable  Result Date: 10/31/2022 CLINICAL DATA:  Fall on blood thinners EXAM: PORTABLE PELVIS 1 VIEWS COMPARISON:  None Available. FINDINGS: There is no evidence of pelvic fracture or diastasis. No pelvic bone lesions are seen. IMPRESSION: No acute fracture or dislocation. Electronically Signed   By: Agustin Cree M.D.   On: 10/31/2022 17:38   DG Chest Port 1 View  Result Date: 10/31/2022 CLINICAL DATA:  Fall on thinners EXAM: PORTABLE CHEST 1 VIEW COMPARISON:  Chest radiograph dated 02/28/2020 FINDINGS: Normal lung volumes. No focal consolidations. No pleural effusion or pneumothorax. Mildly enlarged cardiomediastinal silhouette status post aortic valve replacement. No radiographic finding of acute displaced fracture. IMPRESSION: 1. No radiographic finding of acute displaced fracture. 2. Mildly enlarged cardiomediastinal silhouette status post aortic valve replacement. Electronically Signed   By: Agustin Cree M.D.   On: 10/31/2022 17:37    Recent Labs: Lab Results  Component Value Date   WBC 13.3 (H) 11/03/2022   HGB 13.2 11/03/2022   PLT 353 11/03/2022   NA 135 11/04/2022   K 3.7 11/04/2022   CL 97 (L) 11/04/2022   CO2 26 11/04/2022   GLUCOSE 98 11/04/2022   BUN 28 (H) 11/04/2022   CREATININE 1.55 (H) 11/04/2022   BILITOT 0.6 10/31/2022   ALKPHOS 54 10/31/2022   AST 23 10/31/2022   ALT 24 10/31/2022   PROT 6.8 10/31/2022   ALBUMIN 3.2 (L) 10/31/2022   CALCIUM 9.2 11/04/2022   GFRAA 66 10/16/2020    Speciality Comments: No specialty comments available.  Procedures:  No procedures performed Allergies: Codeine, Glimepiride, Jardiance [empagliflozin], Metformin and related, and Penicillins   Assessment / Plan:     Visit Diagnoses: Rheumatoid arthritis involving multiple sites with positive rheumatoid factor (HCC) - RF 309, ANA negative, CRP  5.8, synovitis: Patient had no synovitis on  the examination.  She states that she is doing well on methotrexate 3 tablets p.o. weekly along with prednisone 3 mg p.o. daily.  She does not want to taper prednisone.  Her recent labs showed elevated creatinine and low GFR.  She is also needing diuretics for congestive heart failure after the recent hospitalization.  I did detailed discussion with the patient regarding coming off methotrexate.  She was in agreement.  Will discontinue methotrexate and start her on leflunomide 10 mg p.o. every other day.  Indications side effects contraindications of leflunomide were discussed at length.  Handout was placed in the AVS.  She was advised to get labs in 2 weeks, 2 months and then every 3 months.  Medication counseling:   Baseline Immunosuppressant Therapy Labs        Latest Ref Rng & Units 10/28/2016   11:24 AM  Hepatitis  Hep B IgM NON REACTIVE NON REACTIVE     Lab Results  Component Value Date   HIV Non Reactive 01/13/2020       Latest Ref Rng & Units 10/28/2016   11:24 AM  Immunoglobulin Electrophoresis  IgG 694 - 1,618 mg/dL 010   IgM 48 - 932 mg/dL 355       Patient was counseled on the purpose, proper use, and adverse effects of leflunomide including risk of infection, nausea/diarrhea/weight loss, increase in blood pressure, rash, hair loss, tingling in the hands and feet, and signs and symptoms of interstitial lung disease.   Also counseled on Black Box warning of liver injury and importance of avoiding alcohol while on therapy. Discussed that there is the possibility of an increased risk of malignancy but it is not well understood if this increased risk is due to the medication or the disease state.  Counseled patient to avoid live vaccines. Recommend annual influenza, Pneumovax 23, Prevnar 13, and Shingrix as indicated.   Discussed the importance of frequent monitoring of liver function and blood count.  Standing orders placed.  Provided patient with educational materials on  leflunomide and answered all questions.  Patient consented to [place] use, and consent will be uploaded into the media tab.      High risk medication use -patient will discontinue methotrexate (methotrexate 2.5 mg 3 tablets by mouth every 7 days, folic acid 1 mg  daily,) and prednisone 3 mg po daily.  She was advised to get labs in 2 weeks, 2 months and then every 3 months.  Information on immunization was placed in the wrist.  She was advised to hold leflunomide if she develops an infection and resume after the infection resolves.  Polymyalgia rheumatica (HCC)-she denies any muscular weakness or tenderness.  Primary osteoarthritis of both hands-she had bilateral PIP and DIP thickening with no synovitis.  Primary osteoarthritis of both knees-she had good range motion of bilateral knee joints without any warmth swelling or effusion.  She denies any discomfort today.  Primary osteoarthritis of both feet-she had no tenderness over ankles or MTPs.  History of humerus fracture - Followed by Dr. Jerl Santos.  DDD (degenerative disc disease), lumbar - With a spinal stenosis.  Osteopenia of multiple sites - DEXA 05/15/18: The BMD measured at Femur Neck Left is 0.820 g/cm2 with a T-score of -1.6.  Calcium rich diet was advised.  Other medical problems are listed as follows:  Status post aortic valve replacement - October 2021.  Patient is on chronic anticoagulation.  Chronic anticoagulation - on Plavix and Eliquis  History of hypertension  History of diabetes mellitus  History of hyperlipidemia  History of gastroesophageal reflux (GERD)  History of recent fall-patient had recent fall with laceration over her left eye.  She was hospitalized with congestive heart failure and required diuresis.  She also had urinary tract infection which was treated with amoxicillin.  I offered physical therapy for fall prevention which she declined.  Orders: No orders of the defined types were placed in this  encounter.  Meds ordered this encounter  Medications   leflunomide (ARAVA) 10 MG tablet    Sig: Take 1 tablet (10 mg total) by mouth every other day.    Dispense:  15 tablet    Refill:  2     Follow-Up Instructions: Return in about 2 months (around 01/24/2023) for Rheumatoid arthritis, Osteoarthritis.   Pollyann Savoy, MD  Note - This record has been created using Animal nutritionist.  Chart creation errors have been sought, but may not always  have been located. Such creation errors do not reflect on  the standard of medical care.

## 2022-11-11 ENCOUNTER — Other Ambulatory Visit: Payer: Self-pay | Admitting: *Deleted

## 2022-11-11 MED ORDER — PREDNISONE 1 MG PO TABS: 3.0000 mg | ORAL_TABLET | Freq: Every day | ORAL | 0 refills | Status: DC

## 2022-11-11 NOTE — Telephone Encounter (Signed)
Last Fill: 07/11/2022  Next Visit: 11/24/2022  Last Visit: 06/21/2022  Dx: Rheumatoid arthritis involving multiple sites with positive rheumatoid factor   Current Dose per office note on 06/21/2022: prednisone 3 mg po daily   Okay to refill Prednisone?

## 2022-11-11 NOTE — Telephone Encounter (Signed)
From: [name] To: Office of Gearldine Bienenstock, [place] Sent: 11/10/2022 5:47 PM EDT Subject: Medication Renewal Request  Refills have been requested for the following medications:   predniSONE (DELTASONE) 1 MG tablet [place]]  Preferred pharmacy: [id number] - [place], [place] - [address] Delivery method: [employer]

## 2022-11-17 DIAGNOSIS — S0181XA Laceration without foreign body of other part of head, initial encounter: Secondary | ICD-10-CM | POA: Diagnosis not present

## 2022-11-17 DIAGNOSIS — I272 Pulmonary hypertension, unspecified: Secondary | ICD-10-CM | POA: Diagnosis not present

## 2022-11-17 DIAGNOSIS — I5033 Acute on chronic diastolic (congestive) heart failure: Secondary | ICD-10-CM | POA: Diagnosis not present

## 2022-11-17 DIAGNOSIS — W19XXXA Unspecified fall, initial encounter: Secondary | ICD-10-CM | POA: Diagnosis not present

## 2022-11-17 DIAGNOSIS — I480 Paroxysmal atrial fibrillation: Secondary | ICD-10-CM | POA: Diagnosis not present

## 2022-11-17 DIAGNOSIS — N1831 Chronic kidney disease, stage 3a: Secondary | ICD-10-CM | POA: Diagnosis not present

## 2022-11-21 ENCOUNTER — Ambulatory Visit (HOSPITAL_COMMUNITY)
Admit: 2022-11-21 | Discharge: 2022-11-21 | Disposition: A | Discharge status: Home / Self Care | Payer: Medicare Other | Attending: Cardiology | Admitting: Cardiology

## 2022-11-21 ENCOUNTER — Encounter (HOSPITAL_COMMUNITY): Payer: Self-pay

## 2022-11-21 VITALS — BP 130/62 | HR 71 | Wt 173.8 lb

## 2022-11-21 DIAGNOSIS — I110 Hypertensive heart disease with heart failure: Secondary | ICD-10-CM | POA: Diagnosis not present

## 2022-11-21 DIAGNOSIS — Z7952 Long term (current) use of systemic steroids: Secondary | ICD-10-CM | POA: Diagnosis not present

## 2022-11-21 DIAGNOSIS — Z79899 Other long term (current) drug therapy: Secondary | ICD-10-CM | POA: Insufficient documentation

## 2022-11-21 DIAGNOSIS — I2510 Atherosclerotic heart disease of native coronary artery without angina pectoris: Secondary | ICD-10-CM | POA: Diagnosis not present

## 2022-11-21 DIAGNOSIS — N390 Urinary tract infection, site not specified: Secondary | ICD-10-CM | POA: Diagnosis not present

## 2022-11-21 DIAGNOSIS — M069 Rheumatoid arthritis, unspecified: Secondary | ICD-10-CM | POA: Diagnosis not present

## 2022-11-21 DIAGNOSIS — Z955 Presence of coronary angioplasty implant and graft: Secondary | ICD-10-CM | POA: Diagnosis not present

## 2022-11-21 DIAGNOSIS — I5032 Chronic diastolic (congestive) heart failure: Secondary | ICD-10-CM | POA: Insufficient documentation

## 2022-11-21 DIAGNOSIS — I272 Pulmonary hypertension, unspecified: Secondary | ICD-10-CM

## 2022-11-21 DIAGNOSIS — R42 Dizziness and giddiness: Secondary | ICD-10-CM | POA: Insufficient documentation

## 2022-11-21 DIAGNOSIS — Z87440 Personal history of urinary (tract) infections: Secondary | ICD-10-CM | POA: Diagnosis not present

## 2022-11-21 DIAGNOSIS — I2729 Other secondary pulmonary hypertension: Secondary | ICD-10-CM | POA: Diagnosis not present

## 2022-11-21 NOTE — Patient Instructions (Signed)
EKG done today.  No labs done today.  Thank you for allowing [place] to provide your heart failure care after your recent hospitalization. Please follow-up with General Cardiology.

## 2022-11-21 NOTE — Progress Notes (Signed)
HEART & VASCULAR TRANSITION OF CARE CONSULT NOTE     Referring Physician: Dr. Jomarie Longs  Primary Care: Georgann Housekeeper, MD Primary Cardiologist: Lance Muss, MD   HPI: Referred to clinic by Dr. Jomarie Longs for heart failure consultation.   89 year old female with history of CAD status post PCI, paroxysmal atrial fibrillation, aortic stenosis status post TAVR, pulmonary hypertension, HFpEF, hypertension, diabetes and Rheumatoid arthritis, who was admitted on 10/31/2022 for mechanical fall and also endorsed 1 month h/o wt gain, LEE and dyapnea. Found to have a UTI and acute CHF. UCx + for Enterococcus faecalis. Treated w/ abx. Cardiology consulted for decompensated HFpEF and PH. Echo showed normal LVEF 60-65%, GIIDD, RV normal w/ severely elevated PASP, 62.5 mmHg. No MR or MS. Aov was very poorly visualized however appeared calcified w/ gradients c/w mild-mod AS. TR was mild-mod. No evidence of atrial level shunting detected by color flow doppler. Chest CT was negative for PE. She was evaluated by cardiology. RHC was not perused. Of note she had prior L/RHC in 2021 that demonstrated a mean PCWP of 22 mmHg and mea PA pressure of 37 w/ transpulmonary gradient 15 with pulmonary vascular resistance estimated at 3.8 Woods units, c/w mixed pre and post capillary pulmonary hypertension. It was felt that her pulmonary hypertension is largely due to left-sided heart disease w/ treatment management being diuretics. It was not believed she would benefit from evaluation for Group 1 pulmonary hypertension medications. It was recommended to continue with conservative approach given her age. Patient was in agreement with this as well. After diuresis w/ IV Lasix, she was transitioned to torsemide. Not placed on SGLT2i given UTI. D/c wt 171 lb.   Referred to [facility] clinic at discharge. She presents today for f/u. Here w/ her 2 daughters. Reports doing well. Dyspnea improved. No LEE. Wt has been stable, checking daily at  home. Wt 172 lb at home today, 173 lb on clinic scale. Report full compliance w/ meds. Tolerating well w/o side effects. She reports she had seen her PCP last week post hospital d/c and he discontinued her losartan b/c she felt lightheaded. This has resolved since stopping the medication. BP has been controlled at home and controlled in clinic today at 130/62. EKG shows NSR 62 bpm. She has finished abx for UTI. No urinary symptoms.     Cardiac Testing  TTE 11/01/2022  1. Left ventricular ejection fraction, by estimation, is 60 to 65%. The  left ventricle has normal function. The left ventricle has no regional  wall motion abnormalities. Left ventricular diastolic parameters are  consistent with Grade II diastolic  dysfunction (pseudonormalization).   2. Right ventricular systolic function is normal. The right ventricular  size is normal. There is severely elevated pulmonary artery systolic  pressure. The estimated right ventricular systolic pressure is 62.5 mmHg.   3. The mitral valve is normal in structure. No evidence of mitral valve  regurgitation. No evidence of mitral stenosis. Moderate mitral annular  calcification.   4. Tricuspid valve regurgitation is mild to moderate.   5. The aortic valve is very poorly visualized, however, appears  calcified. Gradients are consistent with mild-moderate stenosis.   6. The inferior vena cava is normal in size with greater than 50%  respiratory variability, suggesting right atrial pressure of 3 mmHg.   7. Evidence of atrial level shunting detected by color flow Doppler.  Review of Systems: [y] = yes, [ ]  = no   General: Weight gain [ ] ; Weight loss [ ] ;  Anorexia [ ] ; Fatigue [ ] ; Fever [ ] ; Chills [ ] ; Weakness [ ]   Cardiac: Chest pain/pressure [ ] ; Resting SOB [ ] ; Exertional SOB [ ] ; Orthopnea [ ] ; Pedal Edema [ ] ; Palpitations [ ] ; Syncope [ ] ; Presyncope [ ] ; Paroxysmal nocturnal dyspnea[ ]   Pulmonary: Cough [ ] ; Wheezing[ ] ; Hemoptysis[ ] ;  Sputum [ ] ; Snoring [ ]   GI: Vomiting[ ] ; Dysphagia[ ] ; Melena[ ] ; Hematochezia [ ] ; Heartburn[ ] ; Abdominal pain [ ] ; Constipation [ ] ; Diarrhea [ ] ; BRBPR [ ]   GU: Hematuria[ ] ; Dysuria [ ] ; Nocturia[ ]   Vascular: Pain in legs with walking [ ] ; Pain in feet with lying flat [ ] ; Non-healing sores [ ] ; Stroke [ ] ; TIA [ ] ; Slurred speech [ ] ;  Neuro: Headaches[ ] ; Vertigo[ ] ; Seizures[ ] ; Paresthesias[ ] ;Blurred vision [ ] ; Diplopia [ ] ; Vision changes [ ]   Ortho/Skin: Arthritis [ ] ; Joint pain [ ] ; Muscle pain [ ] ; Joint swelling [ ] ; Back Pain [ ] ; Rash [ ]   Psych: Depression[ ] ; Anxiety[ ]   Heme: Bleeding problems [ ] ; Clotting disorders [ ] ; Anemia [ ]   Endocrine: Diabetes [ ] ; Thyroid dysfunction[ ]    Past Medical History:  Diagnosis Date   Allergic rhinitis    Anemia 09/2019   Arthritis    hands   BPPV (benign paroxysmal positional vertigo)    Coronary artery disease    Diabetes mellitus without complication (HCC)    Essential hypertension, benign 01/30/2014   GERD (gastroesophageal reflux disease)    Hypertension    Lesion of pancreas    Mixed hyperlipidemia 01/30/2014   Myocardial infarction (HCC) 2004   mild MI-no damage- had stents   Osteopenia    Post-menopausal    Pulmonary nodules    Rheumatoid arthritis (HCC)    S/P TAVR (transcatheter aortic valve replacement) 03/03/2020   s/p TAVR with a 23 mm Edwards S3U via the TF approach by Drs Excell Seltzer and Cornelius Moras   Severe aortic stenosis    Spinal stenosis    Stenosing tenosynovitis     Current Outpatient Medications  Medication Sig Dispense Refill   apixaban (ELIQUIS) 5 MG TABS tablet Take 1 tablet by mouth twice daily 180 tablet 1   atorvastatin (LIPITOR) 40 MG tablet Take 1 tablet (40 mg total) by mouth at bedtime. 90 tablet 3   B-D ULTRAFINE III SHORT PEN 31G X 8 MM MISC 1 each by Other route in the morning, at noon, in the evening, and at bedtime.      Calcium Carbonate-Vitamin D (CALCIUM + D PO) Take 1 tablet by mouth  daily.      Cholecalciferol (VITAMIN D3) 125 MCG (5000 UT) CAPS Take 5,000 Units by mouth daily.      clopidogrel (PLAVIX) 75 MG tablet Take 4 tablets (300 mg) by mouth this evening, then take 1 tablet (75 mg) by mouth daily 90 tablet 0   desonide (DESOWEN) 0.05 % cream Apply 1 application  topically 2 (two) times daily as needed.     escitalopram (LEXAPRO) 10 MG tablet Take 10 mg by mouth daily.     escitalopram (LEXAPRO) 5 MG tablet Take 10 mg by mouth daily.     estradiol (ESTRACE) 0.1 MG/GM vaginal cream Place 1 Applicatorful vaginally 2 (two) times a week.     ferrous sulfate 325 (65 FE) MG tablet Take 1 tablet (325 mg total) by mouth 2 (two) times daily with a meal. 60 tablet 1   folic acid (FOLVITE) 1  MG tablet Take 1 tablet by mouth once daily 90 tablet 3   gabapentin (NEURONTIN) 100 MG capsule Take 200 mg by mouth at bedtime.     Insulin Degludec (TRESIBA [place]) Inject 8 Units into the skin daily.     insulin lispro (HUMALOG) 100 UNIT/ML KiwkPen Inject 6 Units into the skin 3 (three) times daily.      methotrexate (RHEUMATREX) 2.5 MG tablet TAKE 3 TABLETS BY MOUTH ONCE A WEEK. CAUTION: CHEMOTHERAPY. PROTECT FROM LIGHT. 36 tablet 0   metoprolol succinate (TOPROL-XL) 100 MG 24 hr tablet TAKE 1 TABLET BY MOUTH IN THE MORNING 90 tablet 2   nitroGLYCERIN (NITROSTAT) 0.4 MG SL tablet DISSOLVE ONE TABLET UNDER THE TONGUE EVERY 5 MINUTES AS NEEDED FOR CHEST PAIN.  DO NOT EXCEED A TOTAL OF 3 DOSES IN 15 MINUTES 25 tablet 5   Omega-3 Fatty Acids (FISH OIL PO) Take 1 capsule by mouth daily.      pantoprazole (PROTONIX) 40 MG tablet Take 1 tablet (40 mg total) by mouth daily after lunch. 30 tablet 1   potassium chloride (MICRO-K) 10 MEQ CR capsule Take 10 mEq by mouth daily.     predniSONE (DELTASONE) 1 MG tablet Take 3 tablets (3 mg total) by mouth daily. 270 tablet 0   spironolactone (ALDACTONE) 25 MG tablet Take 1 tablet (25 mg total) by mouth daily. 30 tablet 0   torsemide (DEMADEX) 20 MG tablet  Take 1 tablet (20 mg total) by mouth daily. 30 tablet 0   No current facility-administered medications for this encounter.    Allergies  Allergen Reactions   Codeine Nausea And Vomiting    MAKES HER SICK   Glimepiride Other (See Comments)    Can't remember   Jardiance [Empagliflozin] Other (See Comments)    Yeast   Metformin And Related Diarrhea   Penicillins Rash      Social History   Socioeconomic History   Marital status: Widowed    Spouse name: Not on file   Number of children: Not on file   Years of education: Not on file   Highest education level: High school graduate  Occupational History   Occupation: Retired  Tobacco Use   Smoking status: Never    Passive exposure: Never   Smokeless tobacco: Never  Vaping Use   Vaping Use: Never used  Substance and Sexual Activity   Alcohol use: No   Drug use: No   Sexual activity: Not Currently  Other Topics Concern   Not on file  Social History Narrative   Not on file   Social Determinants of Health   Financial Resource Strain: Low Risk  (11/03/2022)   Overall Financial Resource Strain (CARDIA)    Difficulty of Paying Living Expenses: Not very hard  Food Insecurity: No Food Insecurity (11/01/2022)   Hunger Vital Sign    Worried About Running Out of Food in the Last Year: Never true    Ran Out of Food in the Last Year: Never true  Transportation Needs: No Transportation Needs (11/01/2022)   PRAPARE - Administrator, Civil Service (Medical): No    Lack of Transportation (Non-Medical): No  Physical Activity: Not on file  Stress: Not on file  Social Connections: Not on file  Intimate Partner Violence: Not At Risk (11/01/2022)   Humiliation, Afraid, Rape, and Kick questionnaire    Fear of Current or Ex-Partner: No    Emotionally Abused: No    Physically Abused: No    Sexually Abused:  No      Family History  Problem Relation Age of Onset   CAD Mother    Heart attack Mother    CAD Father    Heart  Problems Brother        open heart surgery    Arthritis Daughter        psoriatic arthritis     Vitals:   11/21/22 1154  BP: 130/62  Pulse: 71  SpO2: 94%  Weight: 78.8 kg (173 lb 12.8 oz)    PHYSICAL EXAM: General:  Well appearing elderly female. No respiratory difficulty HEENT: normal Neck: supple. no JVD. Carotids 2+ bilat; no bruits. No lymphadenopathy or thryomegaly appreciated. Cor: PMI nondisplaced. Regular rate & rhythm. 2/6 AS murmur  Lungs: clear Abdomen: soft, nontender, nondistended. No hepatosplenomegaly. No bruits or masses. Good bowel sounds. Extremities: no cyanosis, clubbing, rash, edema Neuro: alert & oriented x 3, cranial nerves grossly intact. moves all 4 extremities w/o difficulty. Affect pleasant.  ECG: NSR 62 bpm    ASSESSMENT & PLAN:  1. HFpEF - Echo 6/24 EF 60-65%, GIIDD, RV normal  - NYHA II. Euvolemic on exam. Wt stable and c/w hospital dry wt at d/c  - Continue Torsemide 20 mg daily  - Continue Spironolactone 25 mg daily  - Did not tolerate Losartan due to lightheadedness  - No SGLT2i given recent UTI  - Continue Metoprolol Succinate 100 mg daily  - PCP follows labs (recently checked)  - discussed continuation of daily wts. Low sodium diet and fluid restriction    2. Pulmonary Hypertension  - prior L/RHC in 2021 demonstrated mPCWP 22 mmHg and mPAP 37 w/ transpulmonary gradient 15 with pulmonary vascular resistance estimated at 3.8 Woods units, c/w mixed pre and post capillary pulmonary hypertension. - Echo 6/24 RVSP 62.5 mmHg. RV systolic function normal. LVEF 60-65%, +GIIDD. No MR/MS, at least mild-mod AS by gradient measurement.  No evidence of atrial level shunting detected by color flow doppler.  -Chest CT - for PE.  -Suspected to be largely secondary to left sided heart disease, WHO Group 2, Though cannot r/o WHO Group 1 PH as she also has a h/o rheumatoid arthritis. However given her age, plan is to continue with conservative approach. Pt  also in agreement w/ this. No plans for repeat RHC/further w/u - continue diuretics per above   3. Rheumatoid Arthritis  - followed by rheumatology  - on prednisone + methotrexate   4. CAD  - s/p PCI to mLAD and pRCA in 2021 - stable w/o CP - continue medical therapy    Referred to HFSW (PCP, Medications, Transportation, ETOH Abuse, Drug Abuse, Insurance, Financial ): No Refer to Pharmacy:  No Refer to Home Health: No Refer to Advanced Heart Failure Clinic: No  Refer to General Cardiology: Yes (already followed by Dr. Eldridge Dace)   Follow up  w/ cardiology in 6 wks.   Robbie Lis, PA-C

## 2022-11-24 ENCOUNTER — Encounter: Payer: Self-pay | Admitting: Rheumatology

## 2022-11-24 ENCOUNTER — Ambulatory Visit: Payer: Medicare Other | Attending: Rheumatology | Admitting: Rheumatology

## 2022-11-24 VITALS — BP 96/61 | HR 66 | Resp 14 | Ht 62.0 in | Wt 172.8 lb

## 2022-11-24 DIAGNOSIS — M170 Bilateral primary osteoarthritis of knee: Secondary | ICD-10-CM | POA: Insufficient documentation

## 2022-11-24 DIAGNOSIS — Z8781 Personal history of (healed) traumatic fracture: Secondary | ICD-10-CM | POA: Insufficient documentation

## 2022-11-24 DIAGNOSIS — Z952 Presence of prosthetic heart valve: Secondary | ICD-10-CM | POA: Insufficient documentation

## 2022-11-24 DIAGNOSIS — M19072 Primary osteoarthritis, left ankle and foot: Secondary | ICD-10-CM | POA: Insufficient documentation

## 2022-11-24 DIAGNOSIS — Z9181 History of falling: Secondary | ICD-10-CM | POA: Diagnosis not present

## 2022-11-24 DIAGNOSIS — Z7901 Long term (current) use of anticoagulants: Secondary | ICD-10-CM | POA: Diagnosis not present

## 2022-11-24 DIAGNOSIS — M19042 Primary osteoarthritis, left hand: Secondary | ICD-10-CM | POA: Diagnosis not present

## 2022-11-24 DIAGNOSIS — M19041 Primary osteoarthritis, right hand: Secondary | ICD-10-CM | POA: Diagnosis not present

## 2022-11-24 DIAGNOSIS — M0579 Rheumatoid arthritis with rheumatoid factor of multiple sites without organ or systems involvement: Secondary | ICD-10-CM | POA: Diagnosis not present

## 2022-11-24 DIAGNOSIS — M5136 Other intervertebral disc degeneration, lumbar region: Secondary | ICD-10-CM | POA: Diagnosis not present

## 2022-11-24 DIAGNOSIS — Z79899 Other long term (current) drug therapy: Secondary | ICD-10-CM | POA: Diagnosis not present

## 2022-11-24 DIAGNOSIS — Z8679 Personal history of other diseases of the circulatory system: Secondary | ICD-10-CM | POA: Diagnosis not present

## 2022-11-24 DIAGNOSIS — M353 Polymyalgia rheumatica: Secondary | ICD-10-CM | POA: Insufficient documentation

## 2022-11-24 DIAGNOSIS — Z8639 Personal history of other endocrine, nutritional and metabolic disease: Secondary | ICD-10-CM | POA: Insufficient documentation

## 2022-11-24 DIAGNOSIS — M8589 Other specified disorders of bone density and structure, multiple sites: Secondary | ICD-10-CM | POA: Insufficient documentation

## 2022-11-24 DIAGNOSIS — M19071 Primary osteoarthritis, right ankle and foot: Secondary | ICD-10-CM | POA: Diagnosis not present

## 2022-11-24 DIAGNOSIS — Z8719 Personal history of other diseases of the digestive system: Secondary | ICD-10-CM | POA: Diagnosis not present

## 2022-11-24 MED ORDER — LEFLUNOMIDE 10 MG PO TABS
10.0000 mg | ORAL_TABLET | ORAL | 2 refills | Status: DC
Start: 2022-11-24 — End: 2023-01-24

## 2022-11-24 NOTE — Patient Instructions (Signed)
Please discontinue methotrexate   Leflunomide Tablets What is this medication? LEFLUNOMIDE (le FLOO na mide) treats the symptoms of rheumatoid arthritis. It works by slowing down an overactive immune system. This decreases inflammation. It belongs to a group of medications called DMARDs. This medicine may be used for other purposes; ask your health care provider or pharmacist if you have questions. COMMON BRAND NAME(S): Arava What should I tell my care team before I take this medication? They need to know if you have any of these conditions: Cancer Diabetes High blood pressure Immune system problems Infection Kidney disease Liver disease Low blood cell levels (white cells, red cells, and platelets) Lung or breathing disease, such as asthma or COPD Recent or upcoming vaccine Skin conditions Tingling of the fingers or toes, or other nerve disorder An unusual or allergic reaction to leflunomide, other medications, food, dyes, or preservatives Pregnant or trying to get pregnant Breastfeeding How should I use this medication? Take this medication by mouth with a full glass of water. Take it as directed on the prescription label at the same time every day. Keep taking it unless your care team tells you to stop. Talk to your care team about the use of this medication in children. Special care may be needed. Overdosage: If you think you have taken too much of this medicine contact a poison control center or emergency room at once. NOTE: This medicine is only for you. Do not share this medicine with others. What if I miss a dose? If you miss a dose, take it as soon as you can. If it is almost time for your next dose, take only that dose. Do not take double or extra doses. What may interact with this medication? Do not take this medication with any of the following: Teriflunomide This medication may also interact with the following: Alosetron Caffeine Cefaclor Certain medications for  diabetes, such as nateglinide, repaglinide, rosiglitazone, pioglitazone Certain medications for high cholesterol, such as atorvastatin, pravastatin, rosuvastatin, simvastatin Charcoal Cholestyramine Ciprofloxacin Duloxetine Estrogen and progestin hormones Furosemide Ketoprofen Live virus vaccines Medications that increase your risk for infection Methotrexate Mitoxantrone Paclitaxel Penicillin Theophylline Tizanidine Warfarin This list may not describe all possible interactions. Give your health care provider a list of all the medicines, herbs, non-prescription drugs, or dietary supplements you use. Also tell them if you smoke, drink alcohol, or use illegal drugs. Some items may interact with your medicine. What should I watch for while using this medication? Visit your care team for regular checks on your progress. Tell your care team if your symptoms do not start to get better or if they get worse. You may need blood work done while you are taking this medication. This medication may cause serious skin reactions. They can happen weeks to months after starting the medication. Contact your care team right away if you notice fevers or flu-like symptoms with a rash. The rash may be red or purple and then turn into blisters or peeling of the skin. You may also notice a red rash with swelling of the face, lips, or lymph nodes in your neck or under your arms. You should not receive certain vaccines during your treatment and for a certain time after your treatment with this medication ends. Talk to your care team for more information. This medication may stay in your body for up to 2 years after your last dose. Tell your care team about any unusual side effects or symptoms. A medication can be given to help lower  your blood levels of this medication more quickly. Talk to your care team if you may be pregnant. This medication can cause serious birth defects if taken during pregnancy and for a while  after the last dose. You will need a negative pregnancy test before starting this medication. Contraception is recommended while taking this medication and for a while after the last dose. Your care team can help you find the option that works for you. Do not breastfeed while taking this medication. What side effects may I notice from receiving this medication? Side effects that you should report to your care team as soon as possible: Allergic reactions--skin rash, itching, hives, swelling of the face, lips, tongue, or throat Dry cough, shortness of breath or trouble breathing Increase in blood pressure Infection--fever, chills, cough, sore throat, wounds that don't heal, pain or trouble when passing urine, general feeling of discomfort or being unwell Redness, blistering, peeling, or loosening of the skin, including inside the mouth Liver injury--right upper belly pain, loss of appetite, nausea, light-colored stool, dark yellow or brown urine, yellowing skin or eyes, unusual weakness or fatigue Pain, tingling, or numbness in the hands or feet Unusual bruising or bleeding Side effects that usually do not require medical attention (report to your care team if they continue or are bothersome): Back pain Diarrhea Hair loss Headache Nausea This list may not describe all possible side effects. Call your doctor for medical advice about side effects. You may report side effects to FDA at 1-800-FDA-1088. Where should I keep my medication? Keep out of the reach of children and pets. Store at room temperature between 20 and 25 degrees C (68 and 77 degrees F). Protect from moisture and light. Keep the container tightly closed. Get rid of any unused medication after the expiration date. To get rid of medications that are no longer needed or have expired: Take the medication to a medication take-back program. Check with your pharmacy or law enforcement to find a location. If you cannot return the  medication, ask your pharmacist or care team how to get rid of this medication safely. NOTE: This sheet is a summary. It may not cover all possible information. If you have questions about this medicine, talk to your doctor, pharmacist, or health care provider.  2024 Elsevier/Gold Standard (2021-10-14 00:00:00)  Standing Labs We placed an order today for your standing lab work.   Please have your standing labs (CBC with differential and CMP with GFR) drawn in 2 weeks, 2 months and then every 3 months  Please have your labs drawn 2 weeks prior to your appointment so that the provider can discuss your lab results at your appointment, if possible.  Please note that you may see your imaging and lab results in MyChart before we have reviewed them. We will contact you once all results are reviewed. Please allow our office up to 72 hours to thoroughly review all of the results before contacting the office for clarification of your results.  WALK-IN LAB HOURS  Monday through Thursday from 8:00 am -12:30 pm and 1:00 pm-5:00 pm and Friday from 8:00 am-12:00 pm.  Patients with office visits requiring labs will be seen before walk-in labs.  You may encounter longer than normal wait times. Please allow additional time. Wait times may be shorter on  Monday and Thursday afternoons.  We do not book appointments for walk-in labs. We appreciate your patience and understanding with our staff.   Labs are drawn by [facility]. Please bring your  co-pay at the time of your lab draw.  You may receive a bill from [facility] for your lab work.  Please note if you are on Hydroxychloroquine and and an order has been placed for a Hydroxychloroquine level,  you will need to have it drawn 4 hours or more after your last dose.  If you wish to have your labs drawn at another location, please call the office 24 hours in advance so we can fax the orders.  The office is located at [address], [address], [place], [place]  [address]   If you have any questions regarding directions or hours of operation,  please call [phone].   As a reminder, please drink plenty of water prior to coming for your lab work. Thanks!  Vaccines You are taking a medication(s) that can suppress your immune system.  The following immunizations are recommended: Flu annually Covid-19  Td/Tdap (tetanus, diphtheria, pertussis) every 10 years Pneumonia (Prevnar 15 then Pneumovax 23 at least 1 year apart.  Alternatively, can take Prevnar 20 without needing additional dose) Shingrix: 2 doses from 4 weeks to 6 months apart  Please check with your PCP to make sure you are up to date.   If you have signs or symptoms of an infection or start antibiotics: First, call your PCP for workup of your infection. Hold your medication through the infection, until you complete your antibiotics, and until symptoms resolve if you take the following: Injectable medication (Actemra, Benlysta, Cimzia, Cosentyx, Enbrel, Humira, Kevzara, Orencia, Remicade, Simponi, Stelara, Taltz, Tremfya) Methotrexate Leflunomide (Arava) Mycophenolate (Cellcept) Harriette Ohara, Olumiant, or Rinvoq

## 2022-12-06 DIAGNOSIS — N1831 Chronic kidney disease, stage 3a: Secondary | ICD-10-CM | POA: Diagnosis not present

## 2022-12-19 ENCOUNTER — Other Ambulatory Visit: Payer: Self-pay | Admitting: *Deleted

## 2022-12-19 DIAGNOSIS — Z79899 Other long term (current) drug therapy: Secondary | ICD-10-CM

## 2022-12-19 LAB — CBC WITH DIFFERENTIAL/PLATELET
Basophils Relative: 0.6 %
Eosinophils Absolute: 417 cells/uL (ref 15–500)
HCT: 40.4 % (ref 35.0–45.0)
Hemoglobin: 13.4 g/dL (ref 11.7–15.5)
Monocytes Relative: 11.2 %
Neutro Abs: 10022 cells/uL — ABNORMAL HIGH (ref 1500–7800)
RDW: 13.6 % (ref 11.0–15.0)
Total Lymphocyte: 13.1 %

## 2022-12-20 LAB — COMPLETE METABOLIC PANEL WITH GFR
AG Ratio: 1.4 (calc) (ref 1.0–2.5)
ALT: 14 U/L (ref 6–29)
AST: 17 U/L (ref 10–35)
Albumin: 4 g/dL (ref 3.6–5.1)
Alkaline phosphatase (APISO): 61 U/L (ref 37–153)
BUN/Creatinine Ratio: 18 (calc) (ref 6–22)
BUN: 21 mg/dL (ref 7–25)
CO2: 27 mmol/L (ref 20–32)
Calcium: 9.5 mg/dL (ref 8.6–10.4)
Chloride: 103 mmol/L (ref 98–110)
Creat: 1.14 mg/dL — ABNORMAL HIGH (ref 0.60–0.95)
Globulin: 2.9 g/dL (calc) (ref 1.9–3.7)
Glucose, Bld: 167 mg/dL — ABNORMAL HIGH (ref 65–99)
Potassium: 4.3 mmol/L (ref 3.5–5.3)
Sodium: 138 mmol/L (ref 135–146)
Total Bilirubin: 0.6 mg/dL (ref 0.2–1.2)
Total Protein: 6.9 g/dL (ref 6.1–8.1)
eGFR: 47 mL/min/{1.73_m2} — ABNORMAL LOW (ref >=60)

## 2022-12-20 LAB — CBC WITH DIFFERENTIAL/PLATELET
Absolute Monocytes: 1557 cells/uL — ABNORMAL HIGH (ref 200–950)
Basophils Absolute: 83 cells/uL (ref 0–200)
Eosinophils Relative: 3 %
Lymphs Abs: 1821 cells/uL (ref 850–3900)
MCH: 30.5 pg (ref 27.0–33.0)
MCHC: 33.2 g/dL (ref 32.0–36.0)
MCV: 91.8 fL (ref 80.0–100.0)
MPV: 10.7 fL (ref 7.5–12.5)
Neutrophils Relative %: 72.1 %
Platelets: 282 10*3/uL (ref 140–400)
RBC: 4.4 10*6/uL (ref 3.80–5.10)
WBC: 13.9 10*3/uL — ABNORMAL HIGH (ref 3.8–10.8)

## 2022-12-20 NOTE — Progress Notes (Signed)
White cell count is elevated and stable.  Creatinine is elevated and stable.  Glucose is elevated.  Please forward results to her PCP.

## 2023-01-12 NOTE — Progress Notes (Signed)
Office Visit Note  Patient: Sophia Ward             Date of Birth: 04-Feb-1933           MRN: [id number]             PCP: Georgann Housekeeper, MD Referring: Georgann Housekeeper, MD Visit Date: 01/24/2023 Occupation: @GUAROCC @  Subjective:  Medication monitoring   History of Present Illness: Sophia Ward is a 89 y.o. female with history of seropositive rheumatoid arthritis, PMR, and osteoarthritis.  Patient is currently taking arava 10 mg 1 tablet by mouth every other day.  [name] was added after her last office visit on 11/24/22.  She is tolerating Arava without any side effects and has not missed any doses recently.  She remains on prednisone 3 mg daily.  Patient states that she has noticed improvement since initiating Arava.  She states she had a small gap in therapy when switching from methotrexate to [place] and at that time was having an increase in symptoms which have since subsided.  She denies any joint swelling at this time.   Activities of Daily Living:  Patient reports morning stiffness for 1.5 hours.   Patient Denies nocturnal pain.  Difficulty dressing/grooming: Denies Difficulty climbing stairs: Reports Difficulty getting out of chair: Denies Difficulty using hands for taps, buttons, cutlery, and/or writing: Denies  Review of Systems  Constitutional:  Negative for fatigue.  HENT:  Positive for mouth dryness. Negative for mouth sores.   Eyes:  Positive for dryness.  Respiratory:  Positive for shortness of breath.   Cardiovascular:  Negative for chest pain and palpitations.  Gastrointestinal:  Positive for diarrhea. Negative for blood in stool and constipation.  Endocrine: Positive for increased urination.  Genitourinary:  Negative for involuntary urination.  Musculoskeletal:  Positive for joint pain, joint pain and morning stiffness. Negative for gait problem, joint swelling, myalgias, muscle weakness, muscle tenderness and myalgias.  Skin:  Positive for rash, hair  loss and sensitivity to sunlight. Negative for color change.  Allergic/Immunologic: Positive for susceptible to infections.  Neurological:  Negative for dizziness and headaches.  Hematological:  Negative for swollen glands.  Psychiatric/Behavioral:  Negative for depressed mood and sleep disturbance. The patient is not nervous/anxious.     PMFS History:  Patient Active Problem List   Diagnosis Date Noted   Acute on chronic diastolic CHF (congestive heart failure) (HCC) 11/01/2022   CKD stage 3a, GFR 45-59 ml/min (HCC) 11/01/2022   PAF (paroxysmal atrial fibrillation) (HCC) 11/01/2022   Heart failure (HCC) 11/01/2022   Acute on chronic diastolic heart failure (HCC) 03/04/2020   S/P TAVR (transcatheter aortic valve replacement) 03/03/2020   Diabetes mellitus without complication (HCC)    Pulmonary nodules    Lesion of pancreas    Severe aortic stenosis    Pressure injury of skin 01/15/2020   Acute respiratory failure with hypoxia (HCC) 01/13/2020   Iron deficiency anemia 01/06/2020   Anemia 10/22/2019   Polymyalgia rheumatica (HCC) 11/25/2016   Primary osteoarthritis of both hands 11/25/2016   Bilateral primary osteoarthritis of knee 11/25/2016   Osteopenia of multiple sites 11/25/2016   Chronic gout involving toe without tophus 10/28/2016   Osteoarthritis of lumbar spine 10/28/2016   History of gastroesophageal reflux (GERD) 10/27/2016   Rheumatoid arthritis involving multiple sites with positive rheumatoid factor (HCC) 10/14/2016   Coronary atherosclerosis of native coronary artery 01/30/2014   Mixed hyperlipidemia 01/30/2014   Essential hypertension, benign 01/30/2014   Obesity, unspecified 01/30/2014  Past Medical History:  Diagnosis Date   Allergic rhinitis    Anemia 09/2019   Arthritis    hands   BPPV (benign paroxysmal positional vertigo)    Coronary artery disease    Diabetes mellitus without complication (HCC)    Essential hypertension, benign 01/30/2014   GERD  (gastroesophageal reflux disease)    Hypertension    Lesion of pancreas    Mixed hyperlipidemia 01/30/2014   Myocardial infarction (HCC) 2004   mild MI-no damage- had stents   Osteopenia    Post-menopausal    Pulmonary nodules    Rheumatoid arthritis (HCC)    S/P TAVR (transcatheter aortic valve replacement) 03/03/2020   s/p TAVR with a 23 mm Edwards S3U via the TF approach by Drs Excell Seltzer and Cornelius Moras   Severe aortic stenosis    Spinal stenosis    Stenosing tenosynovitis     Family History  Problem Relation Age of Onset   CAD Mother    Heart attack Mother    CAD Father    Heart Problems Brother        open heart surgery    Arthritis Daughter        psoriatic arthritis    Past Surgical History:  Procedure Laterality Date   ABDOMINAL HYSTERECTOMY     APPENDECTOMY     BIOPSY  10/25/2019   Procedure: BIOPSY;  Surgeon: Kathi Der, MD;  Location: [facility];  Service: Gastroenterology;;   BIOPSY  10/26/2019   Procedure: BIOPSY;  Surgeon: Kathi Der, MD;  Location: [facility];  Service: Gastroenterology;;   CARDIAC CATHETERIZATION  2004   carpel tunnel     COLONOSCOPY WITH PROPOFOL N/A 10/30/2012   Procedure: COLONOSCOPY WITH PROPOFOL;  Surgeon: Charolett Bumpers, MD;  Location: [facility];  Service: Endoscopy;  Laterality: N/A;   COLONOSCOPY WITH PROPOFOL N/A 10/26/2019   Procedure: COLONOSCOPY WITH PROPOFOL;  Surgeon: Kathi Der, MD;  Location: [facility];  Service: Gastroenterology;  Laterality: N/A;   CORONARY ANGIOPLASTY  2004   CORONARY ATHERECTOMY N/A 02/21/2020   Procedure: CORONARY ATHERECTOMY;  Surgeon: Corky Crafts, MD;  Location: [facility] INVASIVE CV LAB;  Service: Cardiovascular;  Laterality: N/A;   CORONARY PRESSURE/FFR STUDY N/A 01/29/2020   Procedure: INTRAVASCULAR PRESSURE WIRE/FFR STUDY;  Surgeon: Corky Crafts, MD;  Location: [facility] INVASIVE CV LAB;  Service: Cardiovascular;  Laterality: N/A;   CORONARY STENT INTERVENTION N/A 02/21/2020    Procedure: CORONARY STENT INTERVENTION;  Surgeon: Corky Crafts, MD;  Location: [facility] INVASIVE CV LAB;  Service: Cardiovascular;  Laterality: N/A;   CORONARY ULTRASOUND/IVUS N/A 02/21/2020   Procedure: Intravascular Ultrasound/IVUS;  Surgeon: Corky Crafts, MD;  Location: [facility] INVASIVE CV LAB;  Service: Cardiovascular;  Laterality: N/A;   DILATION AND CURETTAGE OF UTERUS     ESOPHAGOGASTRODUODENOSCOPY N/A 10/25/2019   Procedure: ESOPHAGOGASTRODUODENOSCOPY (EGD);  Surgeon: Kathi Der, MD;  Location: [facility] ENDOSCOPY;  Service: Gastroenterology;  Laterality: N/A;   ESOPHAGOGASTRODUODENOSCOPY (EGD) WITH PROPOFOL N/A 10/30/2012   Procedure: ESOPHAGOGASTRODUODENOSCOPY (EGD) WITH PROPOFOL;  Surgeon: Charolett Bumpers, MD;  Location: [facility];  Service: Endoscopy;  Laterality: N/A;   EYE SURGERY     bilateral cataract with lens implant   EYE SURGERY     INJECTION OF SILICONE OIL Left 02/12/2019   Procedure: Injection Of Silicone Oil;  Surgeon: Carmela Rima, MD;  Location: [facility] OR;  Service: Ophthalmology;  Laterality: Left;   left shouler surgery     PHOTOCOAGULATION WITH LASER Left 02/12/2019   Procedure: Photocoagulation With Laser;  Surgeon: Carmela Rima, MD;  Location: [facility] OR;  Service: Ophthalmology;  Laterality: Left;   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 INVASIVE CV LAB;  Service: Cardiovascular;  Laterality: N/A;   TEE WITHOUT CARDIOVERSION N/A 03/03/2020   Procedure: TRANSESOPHAGEAL ECHOCARDIOGRAM (TEE);  Surgeon: Tonny Bollman, MD;  Location: [facility] OR;  Service: Open Heart Surgery;  Laterality: N/A;   TONSILLECTOMY     TRANSCATHETER  AORTIC VALVE REPLACEMENT, TRANSFEMORAL  03/03/2020   TRANSCATHETER AORTIC VALVE REPLACEMENT, TRANSFEMORAL N/A 03/03/2020   Procedure: TRANSCATHETER AORTIC VALVE REPLACEMENT, TRANSFEMORAL USING EDWARDS SAPIEN 3 23 MM AORTIC VALVE.;  Surgeon: Tonny Bollman, MD;  Location: [facility] OR;  Service: Open Heart Surgery;  Laterality: N/A;   VITRECTOMY 25 GAUGE WITH SCLERAL BUCKLE Left 02/12/2019   Procedure: Vitrectomy 25 Gauge With Scleral Buckle;  Surgeon: Carmela Rima, MD;  Location: [facility] OR;  Service: Ophthalmology;  Laterality: Left;   Social History   Social History Narrative   Not on file   Immunization History  Administered Date(s) Administered   Fluad Quad(high Dose 65+) 03/04/2020   Influenza, High Dose Seasonal PF 03/09/2014, 04/09/2015, 03/16/2016, 02/25/2017, 03/20/2018   PFIZER(Purple Top)SARS-COV-2 Vaccination 06/11/2019, 07/01/2019, 03/19/2020   Tdap 10/31/2022   Zoster Recombinant(Shingrix) 02/07/2018, 06/04/2018     Objective: Vital Signs: BP 118/72 (BP Location: Left Arm, Patient Position: Sitting, Cuff Size: Normal)   Pulse 64   Resp 17   Ht 5\' 2"  (1.575 m)   Wt 176 lb (79.8 kg)   BMI 32.19 kg/m    Physical Exam Vitals and nursing note reviewed.  Constitutional:      Appearance: She is well-developed.  HENT:     Head: Normocephalic and atraumatic.  Eyes:     Conjunctiva/sclera: Conjunctivae normal.  Cardiovascular:     Rate and Rhythm: Normal rate and regular rhythm.     Heart sounds: Murmur heard.  Pulmonary:     Effort: Pulmonary effort is normal.     Breath sounds: Normal breath sounds.  Abdominal:     General: Bowel sounds are normal.     Palpations: Abdomen is soft.  Musculoskeletal:     Cervical back: Normal range of motion.  Skin:    General: Skin is warm and dry.     Capillary Refill: Capillary refill takes less than 2 seconds.  Neurological:     Mental Status: She is alert and oriented to person, place, and time.  Psychiatric:        Behavior:  Behavior normal.      Musculoskeletal Exam: C-spine has limited range of motion.  Thoracic kyphosis noted.  Shoulder joints have slightly limited range of motion with abduction.  Elbow joints, wrist joints, MCPs, PIPs, DIPs have good range of motion with no synovitis.  CMC, PIP, DIP thickening consistent with osteoarthritis of both hands.  Hip joints have good range of motion with no groin pain.  Knee joints have good range of motion with no warmth or effusion.  Ankle joints have good range of motion with no joint tenderness.  CDAI Exam: CDAI Score: -- Patient Global: 30 / 100; Provider Global: 30 / 100 Swollen: --; Tender: -- Joint Exam 01/24/2023   No joint exam has  been documented for this visit   There is currently no information documented on the homunculus. Go to the Rheumatology activity and complete the homunculus joint exam.  Investigation: No additional findings.  Imaging: No results found.  Recent Labs: Lab Results  Component Value Date   WBC 13.9 (H) 12/19/2022   HGB 13.0 01/23/2023   PLT 282 12/19/2022   NA 138 12/19/2022   K 4.3 12/19/2022   CL 103 12/19/2022   CO2 27 12/19/2022   GLUCOSE 167 (H) 12/19/2022   BUN 21 12/19/2022   CREATININE 1.14 (H) 12/19/2022   BILITOT 0.6 12/19/2022   ALKPHOS 54 10/31/2022   AST 17 12/19/2022   ALT 14 12/19/2022   PROT 6.9 12/19/2022   ALBUMIN 3.2 (L) 10/31/2022   CALCIUM 9.5 12/19/2022   GFRAA 66 10/16/2020    Speciality Comments: No specialty comments available.  Procedures:  No procedures performed Allergies: Codeine, Glimepiride, Jardiance [empagliflozin], Metformin and related, and Penicillins    Assessment / Plan:     Visit Diagnoses: Rheumatoid arthritis involving multiple sites with positive rheumatoid factor (HCC) - RF 309, ANA negative, CRP 5.8, synovitis: She has no synovitis on examination today.  Patient is currently taking Arava 10 mg 1 tablet by mouth every other day and remains on prednisone 3 mg  daily.  She has been switched from methotrexate to Arava due to elevated creatinine and low GFR.  She has been tolerating Arava without any side effects or recurrent infections.  She has noticed a gradual improvement in her symptoms while taking Arava.  No medication changes will be made at this time.  She will continue to require close lab monitoring.  Patient has requested a 90-day supply of Arava to be sent to the pharmacy.  She was vies notify us if she develops signs or symptoms of a flare.  She will follow-up in the office in 3 months or sooner if needed.  High risk medication use - Arava 10 mg 1 tablet by mouth every other day.  [name] was added after her last office visit on 11/24/2022. Discontinued methotrexate due to elevated creatinine (methotrexate 2.5 mg 3 tablets by mouth every 7 days, folic acid 1 mg  daily.) She remains on prednisone 3 mg po daily. CBC and CMP updated on 12/19/22.  She will require updated lab work in October and every 3 months to monitor for drug toxicity. No recent or recurrent infections.  Discussed the importance of holding arava if she develops signs or symptoms of an infection and to resume once the infection has completely cleared.   Polymyalgia rheumatica (HCC): She has not had any signs or symptoms of a polymyalgia rheumatica flare.  She has good range of motion of both shoulder joints on examination today with some stiffness with full abduction.  Hip joints have good range of motion with no increased discomfort.  She remains on prednisone 3 mg daily and has been unable to taper.  Primary osteoarthritis of both hands: CMC, PIP, DIP thickening consistent with osteoarthritis of both hands.  No synovitis noted on examination today.   Primary osteoarthritis of both knees: Good range of motion of both knee joints on examination today.  No warmth or effusion noted.  Primary osteoarthritis of both feet: Joints have good range of motion with no tenderness or joint  swelling.  History of humerus fracture - Followed by Dr. Jerl Santos.  DDD (degenerative disc disease), lumbar - With spinal stenosis  Osteopenia of multiple sites - DEXA 05/15/18: The  BMD measured at Femur Neck Left is 0.820 g/cm2 with a T-score of -1.6.  Calcium rich diet was advised. She is taking a calcium and vitamin D supplement daily.   Other medical conditions are listed as follows:   Status post aortic valve replacement: October 2021. Patient is on chronic anticoagulation.   Chronic anticoagulation: Followed closely by cardiology.  History of hypertension: BP was 118/72 today in the office.   History of diabetes mellitus  History of hyperlipidemia  History of gastroesophageal reflux (GERD)  Orders: No orders of the defined types were placed in this encounter.  No orders of the defined types were placed in this encounter.   Follow-Up Instructions: Return in about 3 months (around 04/26/2023) for Rheumatoid arthritis.   Gearldine Bienenstock, PA-C  Note - This record has been created using Dragon software.  Chart creation errors have been sought, but may not always  have been located. Such creation errors do not reflect on  the standard of medical care.

## 2023-01-16 DIAGNOSIS — E1122 Type 2 diabetes mellitus with diabetic chronic kidney disease: Secondary | ICD-10-CM | POA: Diagnosis not present

## 2023-01-16 DIAGNOSIS — M353 Polymyalgia rheumatica: Secondary | ICD-10-CM | POA: Diagnosis not present

## 2023-01-16 DIAGNOSIS — I272 Pulmonary hypertension, unspecified: Secondary | ICD-10-CM | POA: Diagnosis not present

## 2023-01-16 DIAGNOSIS — I5030 Unspecified diastolic (congestive) heart failure: Secondary | ICD-10-CM | POA: Diagnosis not present

## 2023-01-16 DIAGNOSIS — Z0000 Encounter for general adult medical examination without abnormal findings: Secondary | ICD-10-CM | POA: Diagnosis not present

## 2023-01-16 DIAGNOSIS — E782 Mixed hyperlipidemia: Secondary | ICD-10-CM | POA: Diagnosis not present

## 2023-01-16 DIAGNOSIS — M069 Rheumatoid arthritis, unspecified: Secondary | ICD-10-CM | POA: Diagnosis not present

## 2023-01-16 DIAGNOSIS — N1831 Chronic kidney disease, stage 3a: Secondary | ICD-10-CM | POA: Diagnosis not present

## 2023-01-16 DIAGNOSIS — I10 Essential (primary) hypertension: Secondary | ICD-10-CM | POA: Diagnosis not present

## 2023-01-16 DIAGNOSIS — D6869 Other thrombophilia: Secondary | ICD-10-CM | POA: Diagnosis not present

## 2023-01-16 DIAGNOSIS — I700 Atherosclerosis of aorta: Secondary | ICD-10-CM | POA: Diagnosis not present

## 2023-01-16 DIAGNOSIS — D509 Iron deficiency anemia, unspecified: Secondary | ICD-10-CM | POA: Diagnosis not present

## 2023-01-16 DIAGNOSIS — Z1331 Encounter for screening for depression: Secondary | ICD-10-CM | POA: Diagnosis not present

## 2023-01-16 DIAGNOSIS — I25119 Atherosclerotic heart disease of native coronary artery with unspecified angina pectoris: Secondary | ICD-10-CM | POA: Diagnosis not present

## 2023-01-19 NOTE — Progress Notes (Signed)
Cardiology Office Note    Date:  01/23/2023  ID:  Sophia Ward, DOB [birth year], MRN [id number] PCP:  Georgann Housekeeper, MD  Cardiologist:  Lance Muss, MD  Electrophysiologist:  None   Chief Complaint: f/u CAD, Afib, valve disease  History of Present Illness: Sophia Ward is a 89 y.o. female with visit-pertinent history of CAD (AMI 2004 s/p PCI to LAD with early ISR with repeat stenting 5 months later, PCI to [facility] and pRCA 2021), chronic HFpEF, pulm HTN, CKD 3b, paroxysmal atrial fibrillation, aortic stenosis s/p TAVR 2021, prior ABLA anemia, hypertension, diabetes, RA seen for follow-up.   She has very complex hx with remote PCI 2004 as above. She was admitted in 09/2019 with Hgb 7.9. She underwent EGD and colonoscopy which were unremarkable except for a few ulcers in the terminal ileum.  During this admission she was also noted to go into new paroxysmal atrial fibrillation. She was also found to have progression of her aortic stenosis with plan to proceed with TAVR workup. That year was also complicated by UTI/PNA and decompensated HF in 12/2019. She recovered and underwent cath 01/2020 with subsequent PCI of the ostial RCA, followed by orbital atherectomy and PCI of the left main/LAD. She underwent TAVR 02/2020. Given anemia she was initially treated with DAPT off Eliquis. Eliquis was later resumed with continuation of Plavix. More recently she was admitted 10/2022 with mechanical fall, volume overload, and UTI. Echo showed normal LVEF 60-65%, GIIDD, RV normal w/ severely elevated PASP, 62.5 mmHg. No MR or MS. Aov was very poorly visualized however appeared calcified w/ gradients c/w mild-mod AS. TR was mild-mod. No evidence of atrial level shunting detected by color flow doppler. Chest CT was negative for PE. She was evaluated by cardiology. RHC was not pursued  (but of note, had RHC at time of [facility] 2021 showing mean PCWP of 22 mmHg and mea PA pressure of 37 w/ transpulmonary  gradient 15 with pulmonary vascular resistance estimated at 3.8 [place] units, c/w mixed pre and post capillary pulmonary hypertension). It was felt that her pulmonary hypertension is largely due to left-sided heart disease w/ treatment management being diuretics. It was not believed she would benefit from evaluation for Group 1 pulmonary hypertension medications. It was recommended to continue with conservative approach given her age. Patient was in agreement with this as well. After diuresis w/ IV Lasix, she was transitioned to torsemide. Not placed on SGLT2i given UTI. She later stopped losartan in follow-up due to lightheadedness.  She is seen for follow-up today overall doing well without any recent CP, SOB, palpitations, dizziness, or syncope. She has noticed at times at home that she'll check her pulse ox and it will be in the 80s randomly at home but jumping around erratically. We checked her O2 sat here in the office. She has severe curvature of the fingers related to her RA. When we use the fingers that have more pronounced bending, the reading registers 91-92%. However, when we utilize the straighter fingers that lie flat against the pulse ox, we are seeing readings of 96%. She is asymptomatic as well. She reports her previously elevated WBC count was attributed to chronic prednisone for her RA.  Labwork independently reviewed: PCP [facility] 01/16/23 Tchol 166, trig 203, LDL 75, Cr 1.370, Hgb 12.7, TSH wnl 11/2022 Hgb 13.4, plt ok, K 4.3, Cr 1.14, LFTs ok 10/2022 Mg last 2.4, A1c 6.4  2021 LDL 57  ROS: .  Please see the history of present illness.  All other systems are reviewed and otherwise negative.  Studies Reviewed: Marland Kitchen    EKG:  EKG is not ordered today but reviewed from 11/21/22, NSR, possible prior anterior infarct without acute STT changes  CV Studies: Cardiac studies reviewed are outlined and summarized above. Otherwise please see EMR for full report.   Current Reported Medications:.     Current Meds  Medication Sig   apixaban (ELIQUIS) 5 MG TABS tablet Take 1 tablet by mouth twice daily   atorvastatin (LIPITOR) 40 MG tablet Take 1 tablet (40 mg total) by mouth at bedtime.   azithromycin (ZITHROMAX) 500 MG tablet Take 1 tablet (500 mg) by mouth 30-60 minutes before dental work.   B-D ULTRAFINE III SHORT PEN 31G X 8 MM MISC 1 each by Other route in the morning, at noon, in the evening, and at bedtime.    Calcium Carbonate-Vitamin D (CALCIUM + D PO) Take 1 tablet by mouth daily.    Cholecalciferol (VITAMIN D3) 125 MCG (5000 UT) CAPS Take 5,000 Units by mouth daily.    clopidogrel (PLAVIX) 75 MG tablet Take 4 tablets (300 mg) by mouth this evening, then take 1 tablet (75 mg) by mouth daily   desonide (DESOWEN) 0.05 % cream Apply 1 application  topically 2 (two) times daily as needed.   escitalopram (LEXAPRO) 10 MG tablet Take 10 mg by mouth daily.   estradiol (ESTRACE) 0.1 MG/GM vaginal cream Place 1 Applicatorful vaginally 2 (two) times a week.   ferrous sulfate 325 (65 FE) MG tablet Take 1 tablet (325 mg total) by mouth 2 (two) times daily with a meal.   folic acid (FOLVITE) 1 MG tablet Take 1 tablet by mouth once daily   gabapentin (NEURONTIN) 100 MG capsule Take 200 mg by mouth at bedtime.   Insulin Degludec (TRESIBA [place]) Inject 8 Units into the skin daily.   insulin lispro (HUMALOG) 100 UNIT/ML KiwkPen Inject 6 Units into the skin 3 (three) times daily.    leflunomide (ARAVA) 10 MG tablet Take 1 tablet (10 mg total) by mouth every other day.   metoprolol succinate (TOPROL-XL) 100 MG 24 hr tablet TAKE 1 TABLET BY MOUTH IN THE MORNING   nitroGLYCERIN (NITROSTAT) 0.4 MG SL tablet DISSOLVE ONE TABLET UNDER THE TONGUE EVERY 5 MINUTES AS NEEDED FOR CHEST PAIN.  DO NOT EXCEED A TOTAL OF 3 DOSES IN 15 MINUTES   Omega-3 Fatty Acids (FISH OIL PO) Take 1 capsule by mouth daily.    pantoprazole (PROTONIX) 40 MG tablet Take 1 tablet (40 mg total) by mouth daily after lunch.   potassium  chloride (MICRO-K) 10 MEQ CR capsule Take 10 mEq by mouth daily.   predniSONE (DELTASONE) 1 MG tablet Take 3 tablets (3 mg total) by mouth daily.   spironolactone (ALDACTONE) 25 MG tablet Take 1 tablet (25 mg total) by mouth daily.   torsemide (DEMADEX) 20 MG tablet Take 1 tablet (20 mg total) by mouth daily.    Physical Exam:    VS:  BP 112/60 (BP Location: Left Arm, Patient Position: Sitting, Cuff Size: Normal)   Pulse 70   Ht 5\' 2"  (1.575 m)   Wt 178 lb (80.7 kg)   SpO2 96%   BMI 32.56 kg/m    Wt Readings from Last 3 Encounters:  01/23/23 178 lb (80.7 kg)  11/24/22 172 lb 12.8 oz (78.4 kg)  11/21/22 173 lb 12.8 oz (78.8 kg)    GEN: Well nourished, well  developed in no acute distress NECK: No JVD; No carotid bruits CARDIAC: RRR, soft flow murmur at RUSB, no rubs gallops RESPIRATORY:  Clear to auscultation without rales, wheezing or rhonchi  ABDOMEN: Soft, non-tender, non-distended EXTREMITIES:  No edema; No acute deformity   Asessement and Plan:.    1. Chronic HFpEF - weight up a few lb from prior trends but appears euvolemic on examination. Recent labs demonstrated mild increase in Cr to 1.37 compared to previous value in 11/2022 but the most recent value actually appears closer to prior baseline. Would continue present regimen which includes BB, MRA, and torsemide.  2. Pulmonary HTN - it was previously felt that her pulmonary hypertension is largely due to left-sided heart disease w/ treatment management being diuretics. It was not believed she would benefit from evaluation for Group 1 pulmonary hypertension medications. She appears euvolemic on examination. I counseled her on following her O2 saturation at home and notifying for any recurrently low readings using her straight fingers, as the ones that are bent do not seem to be making adequate contact with the POx device. If she is seeing true hypoxia at home will need to refer to pulmonary. She is asymptomatic.  3. CAD - doing  well without angina. Lipids have been more recently managed by PCP. Continue metoprolol. I will reach out to Dr. Eldridge Dace to confirm that Plavix is intended to be long term in conjunction with Eliquis. F/u H/H today to ensure stable.  4. H/o AS s/p TAVR - clinically stable. Consider f/u echo 10/2022. SBE ppx reviewed. Has PCN allergy. Will rx azithromycin 500mg  to take 1 tablet 30-60 min before dental work.  5. PAF - maintaining NSR on examination and echocardiogram from 10/2022. Continue Eliquis, 5mg  BID dose remains appropriate. Recommend 6 month interval f/u to keep an eye on Cr. If she develops progression in CKD beyond the 1.5 cutoff for creatinine, will require dose reduction.     Disposition: F/u in 6 months to establish with new [facility] physician given Dr. Hoyle Barr departure.  Signed, Laurann Montana, PA-C

## 2023-01-23 ENCOUNTER — Ambulatory Visit: Payer: Medicare Other | Attending: Physician Assistant | Admitting: Physician Assistant

## 2023-01-23 ENCOUNTER — Encounter: Payer: Self-pay | Admitting: Physician Assistant

## 2023-01-23 VITALS — BP 112/60 | HR 70 | Ht 62.0 in | Wt 178.0 lb

## 2023-01-23 DIAGNOSIS — Z952 Presence of prosthetic heart valve: Secondary | ICD-10-CM | POA: Insufficient documentation

## 2023-01-23 DIAGNOSIS — I5032 Chronic diastolic (congestive) heart failure: Secondary | ICD-10-CM | POA: Insufficient documentation

## 2023-01-23 DIAGNOSIS — I2510 Atherosclerotic heart disease of native coronary artery without angina pectoris: Secondary | ICD-10-CM | POA: Insufficient documentation

## 2023-01-23 DIAGNOSIS — I480 Paroxysmal atrial fibrillation: Secondary | ICD-10-CM | POA: Insufficient documentation

## 2023-01-23 DIAGNOSIS — E1122 Type 2 diabetes mellitus with diabetic chronic kidney disease: Secondary | ICD-10-CM | POA: Diagnosis not present

## 2023-01-23 DIAGNOSIS — I272 Pulmonary hypertension, unspecified: Secondary | ICD-10-CM | POA: Insufficient documentation

## 2023-01-23 MED ORDER — AZITHROMYCIN 500 MG PO TABS: ORAL_TABLET | ORAL | 3 refills | Status: DC

## 2023-01-23 NOTE — Patient Instructions (Signed)
Medication Instructions:  Your physician has recommended you make the following change in your medication:   1) NEW azithromycin 500mg --take 1 tablet by mouth 30-60 minutes before dental work.  *If you need a refill on your cardiac medications before your next appointment, please call your pharmacy*  Lab Work: TODAY: hemoglobin and hematocrit If you have labs (blood work) drawn today and your tests are completely normal, you will receive your results only by: MyChart Message (if you have MyChart) OR A paper copy in the mail If you have any lab test that is abnormal or we need to change your treatment, we will call you to review the results.  Testing/Procedures: None ordered today.  Follow-Up: At [facility], you and your health needs are our priority.  As part of our continuing mission to provide you with exceptional heart care, we have created designated Provider Care Teams.  These Care Teams include your primary Cardiologist (physician) and Advanced Practice Providers (APPs -  Physician Assistants and Nurse Practitioners) who all work together to provide you with the care you need, when you need it.  Your next appointment:   6 month(s)  The format for your next appointment:   In Person  Provider:   New Cardiologist

## 2023-01-24 ENCOUNTER — Other Ambulatory Visit: Payer: Self-pay

## 2023-01-24 ENCOUNTER — Ambulatory Visit: Payer: Medicare Other | Attending: Physician Assistant | Admitting: Physician Assistant

## 2023-01-24 ENCOUNTER — Encounter: Payer: Self-pay | Admitting: Physician Assistant

## 2023-01-24 ENCOUNTER — Telehealth: Payer: Self-pay | Admitting: Physician Assistant

## 2023-01-24 ENCOUNTER — Telehealth: Payer: Self-pay

## 2023-01-24 VITALS — BP 118/72 | HR 64 | Resp 17 | Ht 62.0 in | Wt 176.0 lb

## 2023-01-24 DIAGNOSIS — M353 Polymyalgia rheumatica: Secondary | ICD-10-CM

## 2023-01-24 DIAGNOSIS — M5136 Other intervertebral disc degeneration, lumbar region: Secondary | ICD-10-CM | POA: Insufficient documentation

## 2023-01-24 DIAGNOSIS — Z8719 Personal history of other diseases of the digestive system: Secondary | ICD-10-CM

## 2023-01-24 DIAGNOSIS — Z8781 Personal history of (healed) traumatic fracture: Secondary | ICD-10-CM | POA: Diagnosis not present

## 2023-01-24 DIAGNOSIS — M19041 Primary osteoarthritis, right hand: Secondary | ICD-10-CM | POA: Diagnosis not present

## 2023-01-24 DIAGNOSIS — Z7901 Long term (current) use of anticoagulants: Secondary | ICD-10-CM | POA: Diagnosis not present

## 2023-01-24 DIAGNOSIS — M0579 Rheumatoid arthritis with rheumatoid factor of multiple sites without organ or systems involvement: Secondary | ICD-10-CM

## 2023-01-24 DIAGNOSIS — M19071 Primary osteoarthritis, right ankle and foot: Secondary | ICD-10-CM

## 2023-01-24 DIAGNOSIS — M19072 Primary osteoarthritis, left ankle and foot: Secondary | ICD-10-CM | POA: Diagnosis not present

## 2023-01-24 DIAGNOSIS — Z8679 Personal history of other diseases of the circulatory system: Secondary | ICD-10-CM

## 2023-01-24 DIAGNOSIS — M8589 Other specified disorders of bone density and structure, multiple sites: Secondary | ICD-10-CM | POA: Diagnosis not present

## 2023-01-24 DIAGNOSIS — M51369 Other intervertebral disc degeneration, lumbar region without mention of lumbar back pain or lower extremity pain: Secondary | ICD-10-CM

## 2023-01-24 DIAGNOSIS — M19042 Primary osteoarthritis, left hand: Secondary | ICD-10-CM | POA: Insufficient documentation

## 2023-01-24 DIAGNOSIS — Z952 Presence of prosthetic heart valve: Secondary | ICD-10-CM

## 2023-01-24 DIAGNOSIS — Z8639 Personal history of other endocrine, nutritional and metabolic disease: Secondary | ICD-10-CM

## 2023-01-24 DIAGNOSIS — M170 Bilateral primary osteoarthritis of knee: Secondary | ICD-10-CM

## 2023-01-24 DIAGNOSIS — Z79899 Other long term (current) drug therapy: Secondary | ICD-10-CM | POA: Diagnosis not present

## 2023-01-24 LAB — HEMOGLOBIN AND HEMATOCRIT, BLOOD
Hematocrit: 38.6 % (ref 34.0–46.6)
Hemoglobin: 13 g/dL (ref 11.1–15.9)

## 2023-01-24 MED ORDER — LEFLUNOMIDE 10 MG PO TABS: 10.0000 mg | ORAL_TABLET | ORAL | 0 refills | Status: DC

## 2023-01-24 NOTE — Telephone Encounter (Signed)
Please let pt know I talked to Dr. Eldridge Dace about her blood thinner regimen. I had wanted to confirm that Plavix (clopidogrel) was intended long term along with the Eliquis. He confirms the plan is to continue both these medications. He specifically chose Plavix because of the location of her stent in the past. Notify for any concerns for bleeding, otherwise f/u as planned. Thank you!

## 2023-01-24 NOTE — Telephone Encounter (Signed)
Spoke with patient. She is aware of lab results. She verbalized understanding

## 2023-01-24 NOTE — Patient Instructions (Signed)

## 2023-01-24 NOTE — Telephone Encounter (Signed)
-----   Message from Laurann Montana sent at 01/24/2023  7:47 AM EDT ----- Please let Sophia Ward know that her hemoglobin is stable from the one PCP checked earlier this month at outside office. Good news. Continue plan as discussed.

## 2023-01-24 NOTE — Telephone Encounter (Addendum)
Pt.notified

## 2023-01-24 NOTE — Telephone Encounter (Signed)
Please review and sign pended refill of Arava. Thanks!

## 2023-01-24 NOTE — Telephone Encounter (Signed)
Left message to call back  

## 2023-01-24 NOTE — Telephone Encounter (Signed)
Pt returning nurses phone call. Please advise ?

## 2023-02-01 ENCOUNTER — Other Ambulatory Visit: Payer: Self-pay | Admitting: Interventional Cardiology

## 2023-02-01 DIAGNOSIS — I480 Paroxysmal atrial fibrillation: Secondary | ICD-10-CM

## 2023-02-01 NOTE — Telephone Encounter (Signed)
Prescription refill request for Eliquis received. Indication: Afib  Last office visit: 01/23/23 Shea Evans)  Scr: 1.14 (12/19/22)  Age: 89 Weight: 79.8kg  Appropriate dose. Refill sent.

## 2023-02-13 DIAGNOSIS — H59812 Chorioretinal scars after surgery for detachment, left eye: Secondary | ICD-10-CM | POA: Diagnosis not present

## 2023-02-13 DIAGNOSIS — H35373 Puckering of macula, bilateral: Secondary | ICD-10-CM | POA: Diagnosis not present

## 2023-02-13 DIAGNOSIS — Z961 Presence of intraocular lens: Secondary | ICD-10-CM | POA: Diagnosis not present

## 2023-02-13 DIAGNOSIS — H338 Other retinal detachments: Secondary | ICD-10-CM | POA: Diagnosis not present

## 2023-02-23 ENCOUNTER — Other Ambulatory Visit: Payer: Self-pay | Admitting: Physician Assistant

## 2023-02-23 DIAGNOSIS — M48062 Spinal stenosis, lumbar region with neurogenic claudication: Secondary | ICD-10-CM | POA: Diagnosis not present

## 2023-02-23 NOTE — Telephone Encounter (Signed)
Last Fill: 11/11/2022  Next Visit: 05/01/2023  Last Visit: 01/24/2023  Dx: Rheumatoid arthritis involving multiple sites with positive rheumatoid factor   Current Dose per office note on 01/24/2023: prednisone 3 mg po daily.   Okay to refill Prednisone?

## 2023-03-02 DIAGNOSIS — Z23 Encounter for immunization: Secondary | ICD-10-CM | POA: Diagnosis not present

## 2023-03-08 ENCOUNTER — Ambulatory Visit: Payer: Medicare Other | Admitting: Interventional Cardiology

## 2023-03-20 DIAGNOSIS — Z7185 Encounter for immunization safety counseling: Secondary | ICD-10-CM | POA: Diagnosis not present

## 2023-03-20 DIAGNOSIS — E119 Type 2 diabetes mellitus without complications: Secondary | ICD-10-CM | POA: Diagnosis not present

## 2023-03-20 DIAGNOSIS — I2510 Atherosclerotic heart disease of native coronary artery without angina pectoris: Secondary | ICD-10-CM | POA: Diagnosis not present

## 2023-03-20 DIAGNOSIS — Z794 Long term (current) use of insulin: Secondary | ICD-10-CM | POA: Diagnosis not present

## 2023-04-04 ENCOUNTER — Other Ambulatory Visit: Payer: Self-pay | Admitting: *Deleted

## 2023-04-04 DIAGNOSIS — Z79899 Other long term (current) drug therapy: Secondary | ICD-10-CM

## 2023-04-04 NOTE — Progress Notes (Signed)
WBC count remains elevated. Absolute neutrophils and monocytes are elevated but stable. Rest of CBC WNL.

## 2023-04-05 LAB — CBC WITH DIFFERENTIAL/PLATELET
Absolute Lymphocytes: 1946 {cells}/uL (ref 850–3900)
Absolute Monocytes: 1636 {cells}/uL — ABNORMAL HIGH (ref 200–950)
Basophils Absolute: 99 {cells}/uL (ref 0–200)
Basophils Relative: 0.7 %
Eosinophils Absolute: 282 {cells}/uL (ref 15–500)
Eosinophils Relative: 2 %
HCT: 42.1 % (ref 35.0–45.0)
Hemoglobin: 13.8 g/dL (ref 11.7–15.5)
MCH: 30.7 pg (ref 27.0–33.0)
MCHC: 32.8 g/dL (ref 32.0–36.0)
MCV: 93.8 fL (ref 80.0–100.0)
MPV: 10.9 fL (ref 7.5–12.5)
Monocytes Relative: 11.6 %
Neutro Abs: 10138 {cells}/uL — ABNORMAL HIGH (ref 1500–7800)
Neutrophils Relative %: 71.9 %
Platelets: 280 10*3/uL (ref 140–400)
RBC: 4.49 10*6/uL (ref 3.80–5.10)
RDW: 11.8 % (ref 11.0–15.0)
Total Lymphocyte: 13.8 %
WBC: 14.1 10*3/uL — ABNORMAL HIGH (ref 3.8–10.8)

## 2023-04-05 LAB — COMPLETE METABOLIC PANEL WITH GFR
AG Ratio: 1.2 (calc) (ref 1.0–2.5)
ALT: 11 U/L (ref 6–29)
AST: 16 U/L (ref 10–35)
Albumin: 4.1 g/dL (ref 3.6–5.1)
Alkaline phosphatase (APISO): 57 U/L (ref 37–153)
BUN/Creatinine Ratio: 24 (calc) — ABNORMAL HIGH (ref 6–22)
BUN: 32 mg/dL — ABNORMAL HIGH (ref 7–25)
CO2: 29 mmol/L (ref 20–32)
Calcium: 10 mg/dL (ref 8.6–10.4)
Chloride: 103 mmol/L (ref 98–110)
Creat: 1.36 mg/dL — ABNORMAL HIGH (ref 0.60–0.95)
Globulin: 3.3 g/dL (ref 1.9–3.7)
Glucose, Bld: 96 mg/dL (ref 65–99)
Potassium: 4.4 mmol/L (ref 3.5–5.3)
Sodium: 140 mmol/L (ref 135–146)
Total Bilirubin: 0.4 mg/dL (ref 0.2–1.2)
Total Protein: 7.4 g/dL (ref 6.1–8.1)
eGFR: 38 mL/min/{1.73_m2} — ABNORMAL LOW (ref >=60)

## 2023-04-05 NOTE — Progress Notes (Signed)
Creatinine is elevated-1.36 and GFR is low -38. Avoid the use of NSAIDs.  Please clarify if she has a nephrologist

## 2023-04-05 NOTE — Progress Notes (Signed)
Please forward results to PCP

## 2023-04-17 NOTE — Progress Notes (Unsigned)
Office Visit Note  Patient: Sophia Ward             Date of Birth: 31-May-1932           MRN: [id number]             PCP: Georgann Housekeeper, MD Referring: Georgann Housekeeper, MD Visit Date: 05/01/2023 Occupation: @GUAROCC @  Subjective:  Medication monitoring   History of Present Illness: Sophia Ward is a 89 y.o. female with history of seropositive rheumatoid arthritis. Patient remains on Arava 10 mg 1 tab every other day. Arava was added 11/24/2022.  She remains on prednisone 2 mg daily.  She is tolerating low-dose arava without any side effects.  Patient states since mid-October she has been experiencing increased arthralgias especially in her lower back, both shoulders, both hands, and both feet.  She denies any joint swelling.  She takes Tylenol as needed for pain relief.  Patient states that she is up-to-date with her RSV vaccine, COVID-vaccine, and annual flu shot.  She denies any recent infections. Her arthralgias have gradually started to improve.    Activities of Daily Living:  Patient reports morning stiffness for all day. Patient Reports nocturnal pain.  Difficulty dressing/grooming: Denies Difficulty climbing stairs: Reports Difficulty getting out of chair: Denies Difficulty using hands for taps, buttons, cutlery, and/or writing: Denies  Review of Systems  Constitutional:  Positive for fatigue.  HENT:  Positive for mouth dryness. Negative for mouth sores.   Eyes:  Positive for dryness. Negative for pain and visual disturbance.  Respiratory:  Negative for shortness of breath.   Cardiovascular:  Negative for chest pain and palpitations.  Gastrointestinal:  Positive for diarrhea. Negative for blood in stool and constipation.  Endocrine: Negative for increased urination.  Genitourinary:  Negative for involuntary urination.  Musculoskeletal:  Positive for joint pain, gait problem, joint pain, myalgias, muscle weakness, morning stiffness, muscle tenderness and myalgias.  Negative for joint swelling.  Skin:  Positive for hair loss. Negative for color change, rash and sensitivity to sunlight.  Allergic/Immunologic: Positive for susceptible to infections.  Neurological:  Positive for headaches. Negative for dizziness.  Hematological:  Negative for swollen glands.  Psychiatric/Behavioral:  Negative for depressed mood and sleep disturbance. The patient is not nervous/anxious.     PMFS History:  Patient Active Problem List   Diagnosis Date Noted   Acute on chronic diastolic CHF (congestive heart failure) (HCC) 11/01/2022   CKD stage 3a, GFR 45-59 ml/min (HCC) 11/01/2022   PAF (paroxysmal atrial fibrillation) (HCC) 11/01/2022   Heart failure (HCC) 11/01/2022   Acute on chronic diastolic heart failure (HCC) 03/04/2020   S/P TAVR (transcatheter aortic valve replacement) 03/03/2020   Diabetes mellitus without complication (HCC)    Pulmonary nodules    Lesion of pancreas    Severe aortic stenosis    Pressure injury of skin 01/15/2020   Acute respiratory failure with hypoxia (HCC) 01/13/2020   Iron deficiency anemia 01/06/2020   Anemia 10/22/2019   Polymyalgia rheumatica (HCC) 11/25/2016   Primary osteoarthritis of both hands 11/25/2016   Bilateral primary osteoarthritis of knee 11/25/2016   Osteopenia of multiple sites 11/25/2016   Chronic gout involving toe without tophus 10/28/2016   Osteoarthritis of lumbar spine 10/28/2016   History of gastroesophageal reflux (GERD) 10/27/2016   Rheumatoid arthritis involving multiple sites with positive rheumatoid factor (HCC) 10/14/2016   Coronary atherosclerosis of native coronary artery 01/30/2014   Mixed hyperlipidemia 01/30/2014   Essential hypertension, benign 01/30/2014   Obesity, unspecified 01/30/2014  Past Medical History:  Diagnosis Date   Allergic rhinitis    Anemia 09/2019   Arthritis    hands   BPPV (benign paroxysmal positional vertigo)    Coronary artery disease    Diabetes mellitus without  complication (HCC)    Essential hypertension, benign 01/30/2014   GERD (gastroesophageal reflux disease)    Hypertension    Lesion of pancreas    Mixed hyperlipidemia 01/30/2014   Myocardial infarction (HCC) 2004   mild MI-no damage- had stents   Osteopenia    Post-menopausal    Pulmonary nodules    Rheumatoid arthritis (HCC)    S/P TAVR (transcatheter aortic valve replacement) 03/03/2020   s/p TAVR with a 23 mm Edwards S3U via the TF approach by Drs Excell Seltzer and Cornelius Moras   Severe aortic stenosis    Spinal stenosis    Stenosing tenosynovitis     Family History  Problem Relation Age of Onset   CAD Mother    Heart attack Mother    CAD Father    Heart Problems Brother        open heart surgery    Arthritis Daughter        psoriatic arthritis    Past Surgical History:  Procedure Laterality Date   ABDOMINAL HYSTERECTOMY     APPENDECTOMY     BIOPSY  10/25/2019   Procedure: BIOPSY;  Surgeon: Kathi Der, MD;  Location: [facility];  Service: Gastroenterology;;   BIOPSY  10/26/2019   Procedure: BIOPSY;  Surgeon: Kathi Der, MD;  Location: [facility];  Service: Gastroenterology;;   CARDIAC CATHETERIZATION  2004   carpel tunnel     COLONOSCOPY WITH PROPOFOL N/A 10/30/2012   Procedure: COLONOSCOPY WITH PROPOFOL;  Surgeon: Charolett Bumpers, MD;  Location: [facility];  Service: Endoscopy;  Laterality: N/A;   COLONOSCOPY WITH PROPOFOL N/A 10/26/2019   Procedure: COLONOSCOPY WITH PROPOFOL;  Surgeon: Kathi Der, MD;  Location: [facility];  Service: Gastroenterology;  Laterality: N/A;   CORONARY ANGIOPLASTY  2004   CORONARY ATHERECTOMY N/A 02/21/2020   Procedure: CORONARY ATHERECTOMY;  Surgeon: Corky Crafts, MD;  Location: [facility] INVASIVE CV LAB;  Service: Cardiovascular;  Laterality: N/A;   CORONARY PRESSURE/FFR STUDY N/A 01/29/2020   Procedure: INTRAVASCULAR PRESSURE WIRE/FFR STUDY;  Surgeon: Corky Crafts, MD;  Location: [facility] INVASIVE CV LAB;  Service:  Cardiovascular;  Laterality: N/A;   CORONARY STENT INTERVENTION N/A 02/21/2020   Procedure: CORONARY STENT INTERVENTION;  Surgeon: Corky Crafts, MD;  Location: [facility] INVASIVE CV LAB;  Service: Cardiovascular;  Laterality: N/A;   CORONARY ULTRASOUND/IVUS N/A 02/21/2020   Procedure: Intravascular Ultrasound/IVUS;  Surgeon: Corky Crafts, MD;  Location: [facility] INVASIVE CV LAB;  Service: Cardiovascular;  Laterality: N/A;   DILATION AND CURETTAGE OF UTERUS     ESOPHAGOGASTRODUODENOSCOPY N/A 10/25/2019   Procedure: ESOPHAGOGASTRODUODENOSCOPY (EGD);  Surgeon: Kathi Der, MD;  Location: [facility] ENDOSCOPY;  Service: Gastroenterology;  Laterality: N/A;   ESOPHAGOGASTRODUODENOSCOPY (EGD) WITH PROPOFOL N/A 10/30/2012   Procedure: ESOPHAGOGASTRODUODENOSCOPY (EGD) WITH PROPOFOL;  Surgeon: Charolett Bumpers, MD;  Location: [facility];  Service: Endoscopy;  Laterality: N/A;   EYE SURGERY     bilateral cataract with lens implant   EYE SURGERY     INJECTION OF SILICONE OIL Left 02/12/2019   Procedure: Injection Of Silicone Oil;  Surgeon: Carmela Rima, MD;  Location: [facility] OR;  Service: Ophthalmology;  Laterality: Left;   left shouler surgery     PHOTOCOAGULATION WITH LASER Left 02/12/2019   Procedure: Photocoagulation With Laser;  Surgeon: Carmela Rima, MD;  Location: [facility] OR;  Service: Ophthalmology;  Laterality: Left;   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 02/12/2019   Procedure: REPAIR OF COMPLEX TRACTION RETINAL DETACHMENT;  Surgeon: Carmela Rima, MD;  Location: [facility] OR;  Service: Ophthalmology;  Laterality: Left;   RIGHT/LEFT HEART CATH AND CORONARY ANGIOGRAPHY N/A 01/29/2020   Procedure: RIGHT/LEFT HEART CATH AND CORONARY ANGIOGRAPHY;  Surgeon: Corky Crafts, MD;  Location: [facility] INVASIVE CV LAB;  Service: Cardiovascular;  Laterality: N/A;   TEE WITHOUT CARDIOVERSION N/A 03/03/2020   Procedure: TRANSESOPHAGEAL ECHOCARDIOGRAM (TEE);  Surgeon: Tonny Bollman, MD;  Location: [facility] OR;   Service: Open Heart Surgery;  Laterality: N/A;   TONSILLECTOMY     TRANSCATHETER AORTIC VALVE REPLACEMENT, TRANSFEMORAL  03/03/2020   TRANSCATHETER AORTIC VALVE REPLACEMENT, TRANSFEMORAL N/A 03/03/2020   Procedure: TRANSCATHETER AORTIC VALVE REPLACEMENT, TRANSFEMORAL USING EDWARDS SAPIEN 3 23 MM AORTIC VALVE.;  Surgeon: Tonny Bollman, MD;  Location: [facility] OR;  Service: Open Heart Surgery;  Laterality: N/A;   VITRECTOMY 25 GAUGE WITH SCLERAL BUCKLE Left 02/12/2019   Procedure: Vitrectomy 25 Gauge With Scleral Buckle;  Surgeon: Carmela Rima, MD;  Location: [facility] OR;  Service: Ophthalmology;  Laterality: Left;   Social History   Social History Narrative   Not on file   Immunization History  Administered Date(s) Administered   Fluad Quad(high Dose 65+) 03/04/2020   Influenza, High Dose Seasonal PF 03/09/2014, 04/09/2015, 03/16/2016, 02/25/2017, 03/20/2018   PFIZER(Purple Top)SARS-COV-2 Vaccination 06/11/2019, 07/01/2019, 03/19/2020   Tdap 10/31/2022   Zoster Recombinant(Shingrix) 02/07/2018, 06/04/2018     Objective: Vital Signs: BP 124/76 (BP Location: Right Arm, Patient Position: Sitting, Cuff Size: Large)   Pulse 74   Resp 14   Ht 5' 2.5" (1.588 m)   Wt 177 lb 6.4 oz (80.5 kg)   BMI 31.93 kg/m    Physical Exam Vitals and nursing note reviewed.  Constitutional:      Appearance: She is well-developed.  HENT:     Head: Normocephalic and atraumatic.  Eyes:     Conjunctiva/sclera: Conjunctivae normal.  Cardiovascular:     Rate and Rhythm: Normal rate and regular rhythm.     Heart sounds: Murmur heard.  Pulmonary:     Effort: Pulmonary effort is normal.     Breath sounds: Normal breath sounds.  Abdominal:     General: Bowel sounds are normal.     Palpations: Abdomen is soft.  Musculoskeletal:     Cervical back: Normal range of motion.  Skin:    General: Skin is warm and dry.     Capillary Refill: Capillary refill takes less than 2 seconds.  Neurological:     Mental Status:  She is alert and oriented to person, place, and time.  Psychiatric:        Behavior: Behavior normal.      Musculoskeletal Exam: C-spine has limited range of motion.  Thoracic kyphosis noted.  Limited mobility of the lumbar spine.  Shoulder joints have slightly limited abduction and internal rotation.  All joints, wrist joints, MCPs, PIPs, DIPs have good range of motion with no synovitis.  PIP and DIP thickening consistent with osteoarthritis of both hands.  CMC joint prominence noted bilaterally.  Hip joints have good range of motion with no groin pain.  Knee joints have good range of motion no warmth or effusion.  Ankle joints have good range of motion no tenderness or joint swelling.  CDAI Exam: CDAI Score: -- Patient Global: --; Provider Global: -- Swollen: --; Tender: --  Joint Exam 05/01/2023   No joint exam has been documented for this visit   There is currently no information documented on the homunculus. Go to the Rheumatology activity and complete the homunculus joint exam.  Investigation: No additional findings.  Imaging: No results found.  Recent Labs: Lab Results  Component Value Date   WBC 14.1 (H) 04/04/2023   HGB 13.8 04/04/2023   PLT 280 04/04/2023   NA 140 04/04/2023   K 4.4 04/04/2023   CL 103 04/04/2023   CO2 29 04/04/2023   GLUCOSE 96 04/04/2023   BUN 32 (H) 04/04/2023   CREATININE 1.36 (H) 04/04/2023   BILITOT 0.4 04/04/2023   ALKPHOS 54 10/31/2022   AST 16 04/04/2023   ALT 11 04/04/2023   PROT 7.4 04/04/2023   ALBUMIN 3.2 (L) 10/31/2022   CALCIUM 10.0 04/04/2023   GFRAA 66 10/16/2020    Speciality Comments: No specialty comments available.  Procedures:  No procedures performed Allergies: Codeine, Glimepiride, Jardiance [empagliflozin], Metformin and related, and Penicillins   Assessment / Plan:     Visit Diagnoses: Rheumatoid arthritis involving multiple sites with positive rheumatoid factor (HCC) - RF 309, ANA negative, CRP 5.8, synovitis:  She has no synovitis on examination today.  She has been experiencing increased arthralgias for the past 6 weeks and is unsure if it is related to updating her RSV, annual flu shot, and COVID-vaccine.  She has not noticed any joint swelling but has been experiencing increased stiffness in both hands.  She is also at increased discomfort in her lower back and both feet.  She has been taking Arava 10 mg 1 tablet by mouth every other day.  She continues to tolerate Arava without any side effects and has not had any recent gaps in therapy.  She is currently taking prednisone 2 mg daily.  She is unable to take NSAIDs given history of chronic kidney disease as well as being on Eliquis.  She takes Tylenol as needed for pain relief.  Overall her arthralgias have gradually started to improve.  No inflammation was noted on examination today.  Patient will remain on low-dose Arava as prescribed.  She will follow-up in the office in 3 months or sooner if needed.  High risk medication use - Arava 10 mg 1 tab every other day.  Arava was added  11/24/2022.Dc mtx due to elevated cr (methotrexate 2.5 mg 3 tab q7 days, folic acid 1 mg  daily.) She remains on prednisone 2 mg daily. CBC and CMP updated on 04/04/23.  Her next lab work will be due in February and every 3 months No recent or recurrent infections.  Discussed the importance of holding arava if she develops signs or symptoms of an infection and to resume once the infection has completely cleared.  Up to date with Covid vaccine, annual flu, and RSV vaccine.   Polymyalgia rheumatica (HCC): She has not had any signs or symptoms of a polymyalgia rheumatica flare.  She has been taking prednisone 2 mg daily along with low-dose Arava 10 mg every other day.  She is advised to notify us if she develops signs or symptoms of a flare.  Primary osteoarthritis of both hands: CMC, PIP, DIP thickening consistent with osteoarthritis of both hands.  She has been experiencing some  increased joint stiffness but has no active inflammation.  Primary osteoarthritis of both knees: She has good range of motion of both knee joints on examination.  No warmth or effusion noted.  Primary osteoarthritis of both  feet: She has been experiencing increased discomfort in both feet but has not noticed any joint swelling.  She has good range of motion of both ankle joints with no inflammation.  She is wearing proper fitting shoes.  History of humerus fracture - Dr. Jerl Santos  Degeneration of intervertebral disc of lumbar region without discogenic back pain or lower extremity pain: Chronic pain.  Striae of spinal stenosis.  Followed by Dr. Regino Schultze.  Underwent an injection on 02/23/23.  Osteopenia of multiple sites - DEXA 05/15/18: The BMD measured at Femur Neck Left is 0.820 g/cm2 with a T-score of -1.6.  Other medical conditions are listed as follows:   Status post aortic valve replacement  Chronic anticoagulation  History of hypertension: BP was 124/76 today in the office.   History of diabetes mellitus  History of hyperlipidemia  History of gastroesophageal reflux (GERD)  Orders: No orders of the defined types were placed in this encounter.  Meds ordered this encounter  Medications   leflunomide (ARAVA) 10 MG tablet    Sig: Take 1 tablet (10 mg total) by mouth every other day.    Dispense:  45 tablet    Refill:  0     Follow-Up Instructions: Return in about 3 months (around 07/30/2023) for Rheumatoid arthritis.   Gearldine Bienenstock, PA-C  Note - This record has been created using Dragon software.  Chart creation errors have been sought, but may not always  have been located. Such creation errors do not reflect on  the standard of medical care.

## 2023-05-01 ENCOUNTER — Encounter: Payer: Self-pay | Admitting: Physician Assistant

## 2023-05-01 ENCOUNTER — Ambulatory Visit: Payer: Medicare Other | Attending: Physician Assistant | Admitting: Physician Assistant

## 2023-05-01 VITALS — BP 124/76 | HR 74 | Resp 14 | Ht 62.5 in | Wt 177.4 lb

## 2023-05-01 DIAGNOSIS — Z79899 Other long term (current) drug therapy: Secondary | ICD-10-CM | POA: Insufficient documentation

## 2023-05-01 DIAGNOSIS — Z952 Presence of prosthetic heart valve: Secondary | ICD-10-CM | POA: Insufficient documentation

## 2023-05-01 DIAGNOSIS — M0579 Rheumatoid arthritis with rheumatoid factor of multiple sites without organ or systems involvement: Secondary | ICD-10-CM | POA: Diagnosis not present

## 2023-05-01 DIAGNOSIS — M19071 Primary osteoarthritis, right ankle and foot: Secondary | ICD-10-CM | POA: Diagnosis not present

## 2023-05-01 DIAGNOSIS — Z8719 Personal history of other diseases of the digestive system: Secondary | ICD-10-CM | POA: Insufficient documentation

## 2023-05-01 DIAGNOSIS — M19041 Primary osteoarthritis, right hand: Secondary | ICD-10-CM | POA: Insufficient documentation

## 2023-05-01 DIAGNOSIS — M19042 Primary osteoarthritis, left hand: Secondary | ICD-10-CM | POA: Insufficient documentation

## 2023-05-01 DIAGNOSIS — M353 Polymyalgia rheumatica: Secondary | ICD-10-CM | POA: Diagnosis not present

## 2023-05-01 DIAGNOSIS — Z8781 Personal history of (healed) traumatic fracture: Secondary | ICD-10-CM | POA: Diagnosis not present

## 2023-05-01 DIAGNOSIS — M19072 Primary osteoarthritis, left ankle and foot: Secondary | ICD-10-CM | POA: Insufficient documentation

## 2023-05-01 DIAGNOSIS — Z8679 Personal history of other diseases of the circulatory system: Secondary | ICD-10-CM | POA: Diagnosis not present

## 2023-05-01 DIAGNOSIS — M170 Bilateral primary osteoarthritis of knee: Secondary | ICD-10-CM | POA: Diagnosis not present

## 2023-05-01 DIAGNOSIS — M8589 Other specified disorders of bone density and structure, multiple sites: Secondary | ICD-10-CM | POA: Insufficient documentation

## 2023-05-01 DIAGNOSIS — Z8639 Personal history of other endocrine, nutritional and metabolic disease: Secondary | ICD-10-CM | POA: Insufficient documentation

## 2023-05-01 DIAGNOSIS — Z7901 Long term (current) use of anticoagulants: Secondary | ICD-10-CM | POA: Diagnosis not present

## 2023-05-01 DIAGNOSIS — M51369 Other intervertebral disc degeneration, lumbar region without mention of lumbar back pain or lower extremity pain: Secondary | ICD-10-CM | POA: Insufficient documentation

## 2023-05-01 MED ORDER — LEFLUNOMIDE 10 MG PO TABS: 10.0000 mg | ORAL_TABLET | ORAL | 0 refills | Status: DC

## 2023-05-01 NOTE — Patient Instructions (Signed)

## 2023-05-10 DIAGNOSIS — H52223 Regular astigmatism, bilateral: Secondary | ICD-10-CM | POA: Diagnosis not present

## 2023-05-10 DIAGNOSIS — H524 Presbyopia: Secondary | ICD-10-CM | POA: Diagnosis not present

## 2023-05-10 DIAGNOSIS — E119 Type 2 diabetes mellitus without complications: Secondary | ICD-10-CM | POA: Diagnosis not present

## 2023-05-10 DIAGNOSIS — H40013 Open angle with borderline findings, low risk, bilateral: Secondary | ICD-10-CM | POA: Diagnosis not present

## 2023-05-10 DIAGNOSIS — H35371 Puckering of macula, right eye: Secondary | ICD-10-CM | POA: Diagnosis not present

## 2023-05-10 DIAGNOSIS — H53452 Other localized visual field defect, left eye: Secondary | ICD-10-CM | POA: Diagnosis not present

## 2023-06-13 DIAGNOSIS — R059 Cough, unspecified: Secondary | ICD-10-CM | POA: Diagnosis not present

## 2023-06-13 DIAGNOSIS — J189 Pneumonia, unspecified organism: Secondary | ICD-10-CM | POA: Diagnosis not present

## 2023-06-13 DIAGNOSIS — R062 Wheezing: Secondary | ICD-10-CM | POA: Diagnosis not present

## 2023-06-13 DIAGNOSIS — Z20822 Contact with and (suspected) exposure to covid-19: Secondary | ICD-10-CM | POA: Diagnosis not present

## 2023-06-13 DIAGNOSIS — R0602 Shortness of breath: Secondary | ICD-10-CM | POA: Diagnosis not present

## 2023-06-19 DIAGNOSIS — J158 Pneumonia due to other specified bacteria: Secondary | ICD-10-CM | POA: Diagnosis not present

## 2023-06-19 DIAGNOSIS — R062 Wheezing: Secondary | ICD-10-CM | POA: Diagnosis not present

## 2023-06-19 DIAGNOSIS — R0902 Hypoxemia: Secondary | ICD-10-CM | POA: Diagnosis not present

## 2023-06-19 DIAGNOSIS — E119 Type 2 diabetes mellitus without complications: Secondary | ICD-10-CM | POA: Diagnosis not present

## 2023-06-19 DIAGNOSIS — R0602 Shortness of breath: Secondary | ICD-10-CM | POA: Diagnosis not present

## 2023-06-19 DIAGNOSIS — R051 Acute cough: Secondary | ICD-10-CM | POA: Diagnosis not present

## 2023-06-19 DIAGNOSIS — R0981 Nasal congestion: Secondary | ICD-10-CM | POA: Diagnosis not present

## 2023-06-19 DIAGNOSIS — R0982 Postnasal drip: Secondary | ICD-10-CM | POA: Diagnosis not present

## 2023-06-23 DIAGNOSIS — R059 Cough, unspecified: Secondary | ICD-10-CM | POA: Diagnosis not present

## 2023-06-23 DIAGNOSIS — Z043 Encounter for examination and observation following other accident: Secondary | ICD-10-CM | POA: Diagnosis not present

## 2023-06-23 DIAGNOSIS — W19XXXA Unspecified fall, initial encounter: Secondary | ICD-10-CM | POA: Diagnosis not present

## 2023-06-23 DIAGNOSIS — J189 Pneumonia, unspecified organism: Secondary | ICD-10-CM | POA: Diagnosis not present

## 2023-07-05 ENCOUNTER — Other Ambulatory Visit: Payer: Self-pay | Admitting: Rheumatology

## 2023-07-05 NOTE — Telephone Encounter (Signed)
Last Fill: 02/23/2024  Next Visit: 07/31/2023  Last Visit: 05/01/2023  Dx: Rheumatoid arthritis involving multiple sites with positive rheumatoid factor   Current Dose per office note on 05/01/2023: prednisone 2 mg daily.   Okay to refill Prednisone?

## 2023-07-05 NOTE — Telephone Encounter (Signed)
 Please clarify if she is taking 2 mg or 3 mg daily

## 2023-07-05 NOTE — Telephone Encounter (Signed)
 Spoke with patient and she verified that she is taking Prednisone  2 mg po daily.

## 2023-07-17 NOTE — Progress Notes (Signed)
 Office Visit Note  Patient: Sophia Ward             Date of Birth: [birth year]           MRN: [id number]             PCP: Georgann Housekeeper, MD Referring: Georgann Housekeeper, MD Visit Date: 07/31/2023 Occupation: @GUAROCC @  Subjective:  Medication monitoring   History of Present Illness: Sophia Ward is a 89 y.o. female with history of seropositive rheumatoid arthritis and osteoarthritis.  Patient remains on Arava 10 mg 1 tab every other day. Arava was added 11/24/2022.    She is tolerating arava without any side effects.  Patient reports that she was diagnosed with pneumonia in January 2025 for which she was treated outpatient.  Patient states that she held [place] temporarily while her symptoms resolved but states that she has not had any other recent or recurrent infections.  She denies any other gaps in therapy.  Patient remains on prednisone 2 mg daily.  She states that her symptoms have been tolerable with the current treatment regimen.  She denies any joint swelling at this time.  She denies any new medical conditions.  Activities of Daily Living:  Patient reports morning stiffness for a few minutes.   Patient Denies nocturnal pain.  Difficulty dressing/grooming: Denies Difficulty climbing stairs: Reports Difficulty getting out of chair: Reports Difficulty using hands for taps, buttons, cutlery, and/or writing: Denies  Review of Systems  Constitutional:  Negative for fatigue.  HENT:  Positive for mouth dryness. Negative for mouth sores and nose dryness.   Eyes:  Positive for dryness. Negative for pain.  Respiratory:  Positive for shortness of breath. Negative for difficulty breathing.   Cardiovascular:  Negative for chest pain and palpitations.  Gastrointestinal:  Positive for diarrhea. Negative for blood in stool and constipation.  Endocrine: Negative for increased urination.  Genitourinary:  Negative for involuntary urination.  Musculoskeletal:  Positive for muscle  weakness and morning stiffness. Negative for joint pain, gait problem, joint pain, joint swelling, myalgias, muscle tenderness and myalgias.  Skin:  Positive for rash. Negative for color change, hair loss and sensitivity to sunlight.  Allergic/Immunologic: Positive for susceptible to infections.  Neurological:  Negative for dizziness and headaches.  Hematological:  Positive for bruising/bleeding tendency. Negative for swollen glands.  Psychiatric/Behavioral:  Negative for depressed mood and sleep disturbance. The patient is not nervous/anxious.     PMFS History:  Patient Active Problem List   Diagnosis Date Noted   Acute on chronic diastolic CHF (congestive heart failure) (HCC) 11/01/2022   CKD stage 3a, GFR 45-59 ml/min (HCC) 11/01/2022   PAF (paroxysmal atrial fibrillation) (HCC) 11/01/2022   Heart failure (HCC) 11/01/2022   Acute on chronic diastolic heart failure (HCC) 03/04/2020   S/P TAVR (transcatheter aortic valve replacement) 03/03/2020   Diabetes mellitus without complication (HCC)    Pulmonary nodules    Lesion of pancreas    Severe aortic stenosis    Pressure injury of skin 01/15/2020   Acute respiratory failure with hypoxia (HCC) 01/13/2020   Iron deficiency anemia 01/06/2020   Anemia 10/22/2019   Polymyalgia rheumatica (HCC) 11/25/2016   Primary osteoarthritis of both hands 11/25/2016   Bilateral primary osteoarthritis of knee 11/25/2016   Osteopenia of multiple sites 11/25/2016   Chronic gout involving toe without tophus 10/28/2016   Osteoarthritis of lumbar spine 10/28/2016   History of gastroesophageal reflux (GERD) 10/27/2016   Rheumatoid arthritis involving multiple sites with positive rheumatoid factor (  HCC) 10/14/2016   Coronary atherosclerosis of native coronary artery 01/30/2014   Mixed hyperlipidemia 01/30/2014   Essential hypertension, benign 01/30/2014   Obesity, unspecified 01/30/2014    Past Medical History:  Diagnosis Date   Allergic rhinitis     Anemia 09/2019   Arthritis    hands   BPPV (benign paroxysmal positional vertigo)    Coronary artery disease    Diabetes mellitus without complication (HCC)    Essential hypertension, benign 01/30/2014   GERD (gastroesophageal reflux disease)    Hypertension    Lesion of pancreas    Mixed hyperlipidemia 01/30/2014   Myocardial infarction (HCC) 2004   mild MI-no damage- had stents   Osteopenia    Post-menopausal    Pulmonary nodules    Rheumatoid arthritis (HCC)    S/P TAVR (transcatheter aortic valve replacement) 03/03/2020   s/p TAVR with a 23 mm Edwards S3U via the TF approach by Drs Excell Seltzer and Cornelius Moras   Severe aortic stenosis    Spinal stenosis    Stenosing tenosynovitis     Family History  Problem Relation Age of Onset   CAD Mother    Heart attack Mother    CAD Father    Heart Problems Brother        open heart surgery    Arthritis Daughter        psoriatic arthritis    Past Surgical History:  Procedure Laterality Date   ABDOMINAL HYSTERECTOMY     APPENDECTOMY     BIOPSY  10/25/2019   Procedure: BIOPSY;  Surgeon: Kathi Der, MD;  Location: [facility];  Service: Gastroenterology;;   BIOPSY  10/26/2019   Procedure: BIOPSY;  Surgeon: Kathi Der, MD;  Location: [facility];  Service: Gastroenterology;;   CARDIAC CATHETERIZATION  2004   carpel tunnel     COLONOSCOPY WITH PROPOFOL N/A 10/30/2012   Procedure: COLONOSCOPY WITH PROPOFOL;  Surgeon: Charolett Bumpers, MD;  Location: [facility];  Service: Endoscopy;  Laterality: N/A;   COLONOSCOPY WITH PROPOFOL N/A 10/26/2019   Procedure: COLONOSCOPY WITH PROPOFOL;  Surgeon: Kathi Der, MD;  Location: [facility];  Service: Gastroenterology;  Laterality: N/A;   CORONARY ANGIOPLASTY  2004   CORONARY ATHERECTOMY N/A 02/21/2020   Procedure: CORONARY ATHERECTOMY;  Surgeon: Corky Crafts, MD;  Location: [facility] INVASIVE CV LAB;  Service: Cardiovascular;  Laterality: N/A;   CORONARY PRESSURE/FFR STUDY N/A 01/29/2020    Procedure: INTRAVASCULAR PRESSURE WIRE/FFR STUDY;  Surgeon: Corky Crafts, MD;  Location: [facility] INVASIVE CV LAB;  Service: Cardiovascular;  Laterality: N/A;   CORONARY STENT INTERVENTION N/A 02/21/2020   Procedure: CORONARY STENT INTERVENTION;  Surgeon: Corky Crafts, MD;  Location: [facility] INVASIVE CV LAB;  Service: Cardiovascular;  Laterality: N/A;   CORONARY ULTRASOUND/IVUS N/A 02/21/2020   Procedure: Intravascular Ultrasound/IVUS;  Surgeon: Corky Crafts, MD;  Location: [facility] INVASIVE CV LAB;  Service: Cardiovascular;  Laterality: N/A;   DILATION AND CURETTAGE OF UTERUS     ESOPHAGOGASTRODUODENOSCOPY N/A 10/25/2019   Procedure: ESOPHAGOGASTRODUODENOSCOPY (EGD);  Surgeon: Kathi Der, MD;  Location[facility] ENDOSCOPY;  Service: Gastroenterology;  Laterality: N/A;   ESOPHAGOGASTRODUODENOSCOPY (EGD) WITH PROPOFOL N/A 10/30/2012   Procedure: ESOPHAGOGASTRODUODENOSCOPY (EGD) WITH PROPOFOL;  Surgeon: Charolett Bumpers, MD;  Location: [facility]OS[facility];  Service: Endoscopy;  Laterality: N/A;   EYE SURGERY     bilateral cataract with lens implant   EYE SURGERY     INJECTION OF SILICONE OIL Left 02/12/2019   Procedure: Injection Of Silicone Oil;  Surgeon: Carmela Rima, MD;  Location: [facility] OR;  Service: Ophthalmology;  Laterality: Left;   left shouler surgery     PHOTOCOAGULATION WITH LASER Left 02/12/2019   Procedure: Photocoagulation With Laser;  Surgeon: Carmela Rima, MD;  Location[facility] OR;  Service: Ophthalmology;  Laterality: Left;   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 02/12/2019   Procedure: REPAIR OF COMPLEX TRACTION RETINAL DETACHMENT;  Surgeon: Carmela Rima, MD;  Location: [facility] OR;  Service: Ophthalmology;  Laterality: Left;   RIGHT/LEFT HEART CATH AND CORONARY ANGIOGRAPHY N/A 01/29/2020   Procedure: RIGHT/LEFT HEART CATH AND CORONARY ANGIOGRAPHY;  Surgeon: Corky Crafts, MD;  Location: [facility] INVASIVE CV LAB;  Service: Cardiovascular;  Laterality: N/A;   TEE WITHOUT  CARDIOVERSION N/A 03/03/2020   Procedure: TRANSESOPHAGEAL ECHOCARDIOGRAM (TEE);  Surgeon: Tonny Bollman, MD;  Location: [facility] OR;  Service: Open Heart Surgery;  Laterality: N/A;   TONSILLECTOMY     TRANSCATHETER AORTIC VALVE REPLACEMENT, TRANSFEMORAL  03/03/2020   TRANSCATHETER AORTIC VALVE REPLACEMENT, TRANSFEMORAL N/A 03/03/2020   Procedure: TRANSCATHETER AORTIC VALVE REPLACEMENT, TRANSFEMORAL USING EDWARDS SAPIEN 3 23 MM AORTIC VALVE.;  Surgeon: Tonny Bollman, MD;  Location: [facility] OR;  Service: Open Heart Surgery;  Laterality: N/A;   VITRECTOMY 25 GAUGE WITH SCLERAL BUCKLE Left 02/12/2019   Procedure: Vitrectomy 25 Gauge With Scleral Buckle;  Surgeon: Carmela Rima, MD;  Location: [facility] OR;  Service: Ophthalmology;  Laterality: Left;   Social History   Social History Narrative   Not on file   Immunization History  Administered Date(s) Administered   Fluad Quad(high Dose 65+) 03/04/2020   Influenza, High Dose Seasonal PF 03/09/2014, 04/09/2015, 03/16/2016, 02/25/2017, 03/20/2018   PFIZER(Purple Top)SARS-COV-2 Vaccination 06/11/2019, 07/01/2019, 03/19/2020   Tdap 10/31/2022   Zoster Recombinant(Shingrix) 02/07/2018, 06/04/2018     Objective: Vital Signs: BP 123/69 (BP Location: Left Arm, Patient Position: Sitting, Cuff Size: Large)   Pulse 65   Resp 15   Ht 5' 2.5" (1.588 m)   Wt 176 lb 3.2 oz (79.9 kg)   BMI 31.71 kg/m    Physical Exam Vitals and nursing note reviewed.  Constitutional:      Appearance: She is well-developed.  HENT:     Head: Normocephalic and atraumatic.  Eyes:     Conjunctiva/sclera: Conjunctivae normal.  Cardiovascular:     Rate and Rhythm: Normal rate and regular rhythm.     Heart sounds: Murmur heard.  Pulmonary:     Effort: Pulmonary effort is normal.     Breath sounds: Normal breath sounds.  Abdominal:     General: Bowel sounds are normal.     Palpations: Abdomen is soft.  Musculoskeletal:     Cervical back: Normal range of motion.  Skin:     General: Skin is warm and dry.     Capillary Refill: Capillary refill takes less than 2 seconds.  Neurological:     Mental Status: She is alert and oriented to person, place, and time.  Psychiatric:        Behavior: Behavior normal.      Musculoskeletal Exam: C-spine has limited range of motion without rotation.  Thoracic and ptosis noted.  Limited mobility of the lumbar spine.  Shoulder joints have slightly limited internal rotation bilaterally.  Elbow joints, wrist joints, MCPs, PIPs, DIPs have good range of motion with no synovitis.  PIP and DIP thickening consistent with osteoarthritis of both hands.  CMC joint prominence and thickening noted bilaterally.  Hip joints have good range of motion with no groin pain.  Knee joints have good range  of motion no warmth or effusion.  Ankle joints have good range of motion with no tenderness or joint swelling.  CDAI Exam: CDAI Score: -- Patient Global: --; Provider Global: -- Swollen: --; Tender: -- Joint Exam 07/31/2023   No joint exam has been documented for this visit   There is currently no information documented on the homunculus. Go to the Rheumatology activity and complete the homunculus joint exam.  Investigation: No additional findings.  Imaging: No results found.  Recent Labs: Lab Results  Component Value Date   WBC 14.1 (H) 04/04/2023   HGB 13.8 04/04/2023   PLT 280 04/04/2023   NA 140 04/04/2023   K 4.4 04/04/2023   CL 103 04/04/2023   CO2 29 04/04/2023   GLUCOSE 96 04/04/2023   BUN 32 (H) 04/04/2023   CREATININE 1.36 (H) 04/04/2023   BILITOT 0.4 04/04/2023   ALKPHOS 54 10/31/2022   AST 16 04/04/2023   ALT 11 04/04/2023   PROT 7.4 04/04/2023   ALBUMIN 3.2 (L) 10/31/2022   CALCIUM 10.0 04/04/2023   GFRAA 66 10/16/2020    Speciality Comments: No specialty comments available.  Procedures:  No procedures performed Allergies: Codeine, Glimepiride, Jardiance [empagliflozin], Metformin and related, and Penicillins      Assessment / Plan:     Visit Diagnoses: Rheumatoid arthritis involving multiple sites with positive rheumatoid factor (HCC) - RF 309, ANA negative, CRP 5.8, synovitis: She has no synovitis on examination today.  She has not had any signs or symptoms of a rheumatoid arthritis flare.  She has clinically been doing well taking Arava 10 mg 1 tablet by mouth every other day and prednisone 2 mg daily.  She is tolerating combination therapy without any side effects and has not had any recent gaps in therapy.  She has not noticed any increased inflammation.  No medication changes will be made at this time.  She was advised to notify us if she develops signs or symptoms of a flare.  She will follow-up in the office in 3 months or sooner if needed.  High risk medication use - Arava 10 mg 1 tab every other day.  Arava was added  11/24/2022. She remains on prednisone 2 mg by mouth daily.  Discontinued mtx due to elevated cr (methotrexate 2.5 mg 3 tab q7 days). CBC and CMP updated on 04/04/23. BMP updated on 07/28/23.  Orders for CBC and hepatic function panel released today.  No recurrent infections. Discussed the importance of holding arava if she develops signs or symptoms of an infection and to resume once the infection has completely cleared.  - Plan: CBC with Differential/Platelet, Hepatic function panel  Polymyalgia rheumatica (HCC) -She has not had any signs or symptoms of a flare.  She remains on prednisone 2 mg daily along with low-dose Arava 10 mg every other day.  Primary osteoarthritis of both hands: CMC, PIP, DIP thickening consistent with osteoarthritis of both hands.  No synovitis noted.  Primary osteoarthritis of both knees: No warmth or effusion noted.  Primary osteoarthritis of both feet: Good range of motion of both ankle joints with no tenderness or joint swelling.  She is wearing proper fitting shoes.  History of humerus fracture - Dr. Jerl Santos  Degeneration of intervertebral disc of  lumbar region without discogenic back pain or lower extremity pain - Chronic pain.  Spinal stenosis.  Followed by Dr. Regino Schultze.  Underwent an injection on 02/23/23.  Her discomfort has been manageable.   Osteopenia of multiple sites - DEXA 05/15/18:  The BMD measured at Femur Neck Left is 0.820 g/cm2 with a T-score of -1.6.  She is taking a calcium and vitamin D supplement daily. She remains on prednisone 2 mg daily long term.   Other medical conditions are listed as follows:  Status post aortic valve replacement  History of hypertension: Blood pressure was 123/69 today in the office.  History of diabetes mellitus  History of hyperlipidemia  History of gastroesophageal reflux (GERD)  Chronic anticoagulation  Orders: Orders Placed This Encounter  Procedures   CBC with Differential/Platelet   Hepatic function panel   Meds ordered this encounter  Medications   leflunomide (ARAVA) 10 MG tablet    Sig: Take 1 tablet (10 mg total) by mouth every other day.    Dispense:  45 tablet    Refill:  0     Follow-Up Instructions: Return in about 3 months (around 10/31/2023) for Rheumatoid arthritis.   Gearldine Bienenstock, PA-C  Note - This record has been created using Dragon software.  Chart creation errors have been sought, but may not always  have been located. Such creation errors do not reflect on  the standard of medical care.

## 2023-07-25 DIAGNOSIS — I10 Essential (primary) hypertension: Secondary | ICD-10-CM | POA: Diagnosis not present

## 2023-07-25 DIAGNOSIS — Z23 Encounter for immunization: Secondary | ICD-10-CM | POA: Diagnosis not present

## 2023-07-25 DIAGNOSIS — I480 Paroxysmal atrial fibrillation: Secondary | ICD-10-CM | POA: Diagnosis not present

## 2023-07-25 DIAGNOSIS — M109 Gout, unspecified: Secondary | ICD-10-CM | POA: Diagnosis not present

## 2023-07-25 DIAGNOSIS — E1122 Type 2 diabetes mellitus with diabetic chronic kidney disease: Secondary | ICD-10-CM | POA: Diagnosis not present

## 2023-07-25 DIAGNOSIS — I5030 Unspecified diastolic (congestive) heart failure: Secondary | ICD-10-CM | POA: Diagnosis not present

## 2023-07-25 DIAGNOSIS — I700 Atherosclerosis of aorta: Secondary | ICD-10-CM | POA: Diagnosis not present

## 2023-07-25 DIAGNOSIS — D6869 Other thrombophilia: Secondary | ICD-10-CM | POA: Diagnosis not present

## 2023-07-25 DIAGNOSIS — N1831 Chronic kidney disease, stage 3a: Secondary | ICD-10-CM | POA: Diagnosis not present

## 2023-07-25 DIAGNOSIS — I272 Pulmonary hypertension, unspecified: Secondary | ICD-10-CM | POA: Diagnosis not present

## 2023-07-25 DIAGNOSIS — Z794 Long term (current) use of insulin: Secondary | ICD-10-CM | POA: Diagnosis not present

## 2023-07-25 DIAGNOSIS — F341 Dysthymic disorder: Secondary | ICD-10-CM | POA: Diagnosis not present

## 2023-07-25 LAB — HEMOGLOBIN A1C: A1c: 6.8

## 2023-07-27 DIAGNOSIS — Z794 Long term (current) use of insulin: Secondary | ICD-10-CM | POA: Diagnosis not present

## 2023-07-27 DIAGNOSIS — E119 Type 2 diabetes mellitus without complications: Secondary | ICD-10-CM | POA: Diagnosis not present

## 2023-07-27 DIAGNOSIS — I2510 Atherosclerotic heart disease of native coronary artery without angina pectoris: Secondary | ICD-10-CM | POA: Diagnosis not present

## 2023-07-28 ENCOUNTER — Encounter: Payer: Self-pay | Admitting: Cardiology

## 2023-07-28 ENCOUNTER — Ambulatory Visit: Payer: Medicare Other | Attending: Cardiology | Admitting: Cardiology

## 2023-07-28 VITALS — BP 120/60 | HR 78 | Ht 62.5 in | Wt 174.8 lb

## 2023-07-28 DIAGNOSIS — I5032 Chronic diastolic (congestive) heart failure: Secondary | ICD-10-CM | POA: Diagnosis not present

## 2023-07-28 DIAGNOSIS — Z952 Presence of prosthetic heart valve: Secondary | ICD-10-CM | POA: Diagnosis not present

## 2023-07-28 DIAGNOSIS — I2510 Atherosclerotic heart disease of native coronary artery without angina pectoris: Secondary | ICD-10-CM | POA: Insufficient documentation

## 2023-07-28 DIAGNOSIS — I272 Pulmonary hypertension, unspecified: Secondary | ICD-10-CM | POA: Insufficient documentation

## 2023-07-28 DIAGNOSIS — I480 Paroxysmal atrial fibrillation: Secondary | ICD-10-CM | POA: Insufficient documentation

## 2023-07-28 NOTE — Progress Notes (Signed)
 Cardiology Office Note:  .   Date:  07/28/2023  ID:  Sophia BUDREAU, DOB 1932-11-20, MRN [id number] PCP: Georgann Housekeeper, MD  [facility] Providers Cardiologist:  Donato Schultz, MD     History of Present Illness: Sophia Ward is a 89 y.o. female Discussed the use of AI scribe software for clinical note transcription with the patient, who gave verbal consent to proceed.  History of Present Illness Sophia Ward is an 89 year old female with coronary artery disease and chronic diastolic heart failure who presents for follow-up. She is accompanied by her daughter, [name].  She has a history of coronary artery disease and chronic diastolic heart failure. She experienced a myocardial infarction in 2004, treated with PCI to the LAD, and had early in-stent restenosis requiring repeat stenting five months later. In 2021, she underwent PCI to the mid LAD and proximal RCA. She also underwent TAVR for aortic stenosis in 2021. Her ejection fraction is 60-65% with grade 2 diastolic dysfunction. No new cardiac symptoms have been reported.  She has pulmonary hypertension with an elevated pulmonary artery systolic pressure of 63 mmHg, attributed to left-sided heart disease. A right heart catheterization confirmed this, and she is managed with goal-directed medical therapy. She is not on group one pulmonary hypertension medications.  She has paroxysmal atrial fibrillation and is on Eliquis for anticoagulation. No recent episodes have been reported.  She has chronic kidney disease stage 3B with stable creatinine levels. Her most recent creatinine is 1.36.  She has hypertension and previously had issues with lightheadedness on losartan. She is stable on her current antihypertensive regimen.  She has type 2 diabetes with a recent HbA1c of 6.8%. She is not on SGLT2 inhibitors due to a history of UTIs.  She has rheumatoid arthritis, which is under ongoing management with no recent  exacerbations.  She has a history of anemia, with a prior hemoglobin of 7.9, but no significant anemia recently. Her most recent hemoglobin is 12.7, and hematocrit is 13.4. Her LDL is 75.  She experienced pneumonia this past winter and has a history of urinary tract infections, though no recent UTIs. She uses an over-the-counter product, Euplora, for UTI prevention, which has been effective.  She has spinal stenosis and sees Doctor Regino Schultze every four months for management.  She lives independently and previously had a place in [place], [place], but sold it after her husband's passing three years ago. She is described as 'full of life'.      Studies Reviewed: .        Results LABS Hemoglobin: 12.7 LDL: 75 Creatinine: 1.36 Hematocrit: 13.4 Hemoglobin A1c: 6.8 ALT: 11  RADIOLOGY Chest CT: Negative for PE  DIAGNOSTIC Echocardiography: Ejection fraction 60-65%, grade 2 diastolic dysfunction, elevated pulmonary artery systolic pressure 63 mmHg Right heart catheterization: Pulmonary hypertension due to left-sided heart disease Risk Assessment/Calculations:            Physical Exam:   VS:  BP 120/60   Pulse 78   Ht 5' 2.5" (1.588 m)   Wt 174 lb 12.8 oz (79.3 kg)   SpO2 91%   BMI 31.46 kg/m    Wt Readings from Last 3 Encounters:  07/28/23 174 lb 12.8 oz (79.3 kg)  05/01/23 177 lb 6.4 oz (80.5 kg)  01/24/23 176 lb (79.8 kg)    GEN: Well nourished, well developed in no acute distress NECK: No JVD; No carotid bruits CARDIAC: RRR, no murmurs, no rubs, no gallops RESPIRATORY:  Clear to auscultation without rales, wheezing or rhonchi  ABDOMEN: Soft, non-tender, non-distended EXTREMITIES:  No edema; No deformity   ASSESSMENT AND PLAN: .    Assessment and Plan Assessment & Plan Coronary Artery Disease (CAD) CAD with a history of myocardial infarction in 2004, PCI to LAD with early in-stent restenosis and repeat stenting five months later, and PCI to mid LAD and proximal RCA in  2021. Currently asymptomatic. Emphasized the importance of continuing current medications and regular follow-ups. - Continue current medications - Schedule six-month follow-up with Dunn - Schedule one-year follow-up with me  Chronic Diastolic Heart Failure Chronic diastolic heart failure with an ejection fraction of 60-65% and grade 2 diastolic dysfunction. Well-managed with no new symptoms. Emphasized goal-directed medical therapy. - Continue goal-directed medical therapy  Pulmonary Hypertension Pulmonary hypertension with elevated pulmonary artery systolic pressure of 63 mmHg, likely secondary to left-sided heart disease. Managed with goal-directed medical therapy. No indication for group 1 pulmonary hypertension medications as determined during right heart catheterization. - Continue current management  Paroxysmal Atrial Fibrillation Paroxysmal atrial fibrillation managed with Eliquis. No new episodes reported. Emphasized adherence to anticoagulation therapy to prevent thromboembolic events. - Continue Eliquis  Aortic Stenosis, Status Post TAVR Aortic stenosis status post TAVR in 2021. The new valve is functioning well. Regular follow-up and monitoring are necessary. - Continue routine follow-up and monitoring  Hypertension Hypertension with previous issues of lightheadedness on losartan. Currently well-controlled. Emphasized maintaining current antihypertensive regimen to prevent complications. - Continue current antihypertensive regimen  Chronic Kidney Disease, Stage 3B Chronic kidney disease, stage 3B, with a stable creatinine level of 1.36. Regular monitoring of renal function is essential. - Monitor renal function regularly  Diabetes Mellitus Diabetes mellitus with a recent hemoglobin A1c of 6.8%. Current management is effective. - Continue current diabetes management  Rheumatoid Arthritis Rheumatoid arthritis with ongoing management. No new issues reported. - Continue  current management  Spinal Stenosis Spinal stenosis managed with periodic injections. Significant improvement in mobility reported. Discussed the need for Dr. Juanda Chance office to send a request for injection approval. - Ensure Dr. Juanda Chance office sends a request for injection approval to our office for review and approval - Continue current pain management regimen  General Health Maintenance None - Continue azithromycin 500 mg prior to dental procedures  Follow-up - Schedule six-month follow-up with Ronie Spies - Schedule one-year follow-up with Dr. Anne Fu.          Signed, Donato Schultz, MD

## 2023-07-28 NOTE — Patient Instructions (Signed)
 Medication Instructions:  The current medical regimen is effective;  continue present plan and medications.  *If you need a refill on your cardiac medications before your next appointment, please call your pharmacy*  Follow-Up: At [facility], you and your health needs are our priority.  As part of our continuing mission to provide you with exceptional heart care, we have created designated Provider Care Teams.  These Care Teams include your primary Cardiologist (physician) and Advanced Practice Providers (APPs -  Physician Assistants and Nurse Practitioners) who all work together to provide you with the care you need, when you need it.  We recommend signing up for the patient portal called "MyChart".  Sign up information is provided on this After Visit Summary.  MyChart is used to connect with patients for Virtual Visits (Telemedicine).  Patients are able to view lab/test results, encounter notes, upcoming appointments, etc.  Non-urgent messages can be sent to your provider as well.   To learn more about what you can do with MyChart, go to [email or web address].    Your next appointment:   6 month(s)  Provider:   Ronie Spies, PA-C     Then, Donato Schultz, MD will plan to see you again in 1 year(s).      1st Floor: - Lobby - Registration  - Pharmacy  - Lab - Cafe  2nd Floor: - PV Lab - Diagnostic Testing (echo, CT, nuclear med)  3rd Floor: - Vacant  4th Floor: - TCTS (cardiothoracic surgery) - AFib Clinic - Structural Heart Clinic - Vascular Surgery  - Vascular Ultrasound  5th Floor: - HeartCare Cardiology (general and EP) - Clinical Pharmacy for coumadin, hypertension, lipid, weight-loss medications, and med management appointments    Valet parking services will be available as well.

## 2023-07-31 ENCOUNTER — Ambulatory Visit: Payer: Medicare Other | Attending: Physician Assistant | Admitting: Physician Assistant

## 2023-07-31 ENCOUNTER — Encounter: Payer: Self-pay | Admitting: Physician Assistant

## 2023-07-31 VITALS — BP 123/69 | HR 65 | Resp 15 | Ht 62.5 in | Wt 176.2 lb

## 2023-07-31 DIAGNOSIS — M19041 Primary osteoarthritis, right hand: Secondary | ICD-10-CM | POA: Insufficient documentation

## 2023-07-31 DIAGNOSIS — M51369 Other intervertebral disc degeneration, lumbar region without mention of lumbar back pain or lower extremity pain: Secondary | ICD-10-CM | POA: Diagnosis not present

## 2023-07-31 DIAGNOSIS — Z8719 Personal history of other diseases of the digestive system: Secondary | ICD-10-CM | POA: Diagnosis not present

## 2023-07-31 DIAGNOSIS — Z7901 Long term (current) use of anticoagulants: Secondary | ICD-10-CM | POA: Diagnosis not present

## 2023-07-31 DIAGNOSIS — Z79899 Other long term (current) drug therapy: Secondary | ICD-10-CM | POA: Insufficient documentation

## 2023-07-31 DIAGNOSIS — Z952 Presence of prosthetic heart valve: Secondary | ICD-10-CM | POA: Insufficient documentation

## 2023-07-31 DIAGNOSIS — Z8679 Personal history of other diseases of the circulatory system: Secondary | ICD-10-CM | POA: Insufficient documentation

## 2023-07-31 DIAGNOSIS — Z8781 Personal history of (healed) traumatic fracture: Secondary | ICD-10-CM | POA: Insufficient documentation

## 2023-07-31 DIAGNOSIS — M0579 Rheumatoid arthritis with rheumatoid factor of multiple sites without organ or systems involvement: Secondary | ICD-10-CM | POA: Insufficient documentation

## 2023-07-31 DIAGNOSIS — M170 Bilateral primary osteoarthritis of knee: Secondary | ICD-10-CM | POA: Diagnosis not present

## 2023-07-31 DIAGNOSIS — Z8639 Personal history of other endocrine, nutritional and metabolic disease: Secondary | ICD-10-CM | POA: Diagnosis not present

## 2023-07-31 DIAGNOSIS — M19042 Primary osteoarthritis, left hand: Secondary | ICD-10-CM | POA: Diagnosis not present

## 2023-07-31 DIAGNOSIS — M19072 Primary osteoarthritis, left ankle and foot: Secondary | ICD-10-CM | POA: Diagnosis not present

## 2023-07-31 DIAGNOSIS — M19071 Primary osteoarthritis, right ankle and foot: Secondary | ICD-10-CM | POA: Diagnosis not present

## 2023-07-31 DIAGNOSIS — M353 Polymyalgia rheumatica: Secondary | ICD-10-CM | POA: Diagnosis not present

## 2023-07-31 DIAGNOSIS — M8589 Other specified disorders of bone density and structure, multiple sites: Secondary | ICD-10-CM | POA: Diagnosis not present

## 2023-07-31 MED ORDER — LEFLUNOMIDE 10 MG PO TABS: 10.0000 mg | ORAL_TABLET | ORAL | 0 refills | Status: DC

## 2023-08-01 LAB — CBC WITH DIFFERENTIAL/PLATELET
Absolute Lymphocytes: 1794 {cells}/uL (ref 850–3900)
Absolute Monocytes: 1560 {cells}/uL — ABNORMAL HIGH (ref 200–950)
Basophils Absolute: 104 {cells}/uL (ref 0–200)
Basophils Relative: 0.8 %
Eosinophils Absolute: 247 {cells}/uL (ref 15–500)
Eosinophils Relative: 1.9 %
HCT: 37.8 % (ref 35.0–45.0)
Hemoglobin: 12.2 g/dL (ref 11.7–15.5)
MCH: 30.4 pg (ref 27.0–33.0)
MCHC: 32.3 g/dL (ref 32.0–36.0)
MCV: 94.3 fL (ref 80.0–100.0)
MPV: 11.3 fL (ref 7.5–12.5)
Monocytes Relative: 12 %
Neutro Abs: 9295 {cells}/uL — ABNORMAL HIGH (ref 1500–7800)
Neutrophils Relative %: 71.5 %
Platelets: 277 10*3/uL (ref 140–400)
RBC: 4.01 10*6/uL (ref 3.80–5.10)
RDW: 12.3 % (ref 11.0–15.0)
Total Lymphocyte: 13.8 %
WBC: 13 10*3/uL — ABNORMAL HIGH (ref 3.8–10.8)

## 2023-08-01 LAB — HEPATIC FUNCTION PANEL
AG Ratio: 1.3 (calc) (ref 1.0–2.5)
ALT: 13 U/L (ref 6–29)
AST: 17 U/L (ref 10–35)
Albumin: 4.1 g/dL (ref 3.6–5.1)
Alkaline phosphatase (APISO): 54 U/L (ref 37–153)
Bilirubin, Direct: 0.1 mg/dL (ref 0.0–0.2)
Globulin: 3.1 g/dL (ref 1.9–3.7)
Indirect Bilirubin: 0.3 mg/dL (ref 0.2–1.2)
Total Bilirubin: 0.4 mg/dL (ref 0.2–1.2)
Total Protein: 7.2 g/dL (ref 6.1–8.1)

## 2023-08-01 NOTE — Progress Notes (Signed)
 WBC count remains elevated.  Absolute neutrophils and monocytes elevated. Rest of CBC WNL.   Hepatic function panel WNL.

## 2023-08-21 DIAGNOSIS — Z8669 Personal history of other diseases of the nervous system and sense organs: Secondary | ICD-10-CM | POA: Diagnosis not present

## 2023-08-21 DIAGNOSIS — H43811 Vitreous degeneration, right eye: Secondary | ICD-10-CM | POA: Diagnosis not present

## 2023-08-21 DIAGNOSIS — H35373 Puckering of macula, bilateral: Secondary | ICD-10-CM | POA: Diagnosis not present

## 2023-08-21 DIAGNOSIS — H59812 Chorioretinal scars after surgery for detachment, left eye: Secondary | ICD-10-CM | POA: Diagnosis not present

## 2023-09-03 ENCOUNTER — Other Ambulatory Visit: Payer: Self-pay | Admitting: Interventional Cardiology

## 2023-09-03 DIAGNOSIS — I480 Paroxysmal atrial fibrillation: Secondary | ICD-10-CM

## 2023-09-04 NOTE — Telephone Encounter (Signed)
 Prescription refill request for Eliquis received. Indication:afib Last office visit:2/25 Scr:1.36  11/24 Age: 89 Weight:79.9  kg  Prescription refilled

## 2023-09-26 DIAGNOSIS — M48062 Spinal stenosis, lumbar region with neurogenic claudication: Secondary | ICD-10-CM | POA: Diagnosis not present

## 2023-10-03 ENCOUNTER — Telehealth: Payer: Self-pay

## 2023-10-03 NOTE — Telephone Encounter (Signed)
   Pre-operative Risk Assessment    Patient Name: Sophia Ward  DOB: 07/02/1932 MRN: [id number]   Date of last office visit: 07/28/23 Date of next office visit: n/a   Request for Surgical Clearance    Procedure:   epidural injection   Date of Surgery:  Clearance TBD    Surgeon:  Cindee Crazier, MD  Surgeon's Group or Practice Name:  [facility]  Phone number:  [phone] Fax number:  [phone]   Type of Clearance Requested:   - Medical  - Pharmacy:  Hold Clopidogrel  (Plavix ) and Apixaban  (Eliquis ) Not indicated    Type of Anesthesia:  Not Indicated   Additional requests/questions:    [name]   10/03/2023, 5:11 PM

## 2023-10-04 NOTE — Telephone Encounter (Signed)
 Patient with diagnosis of A Fib on Eliquis  for anticoagulation.    Procedure: epidural injection Date of procedure: TBD   CHA2DS2-VASc Score = 7  This indicates a 11.2% annual risk of stroke. The patient's score is based upon: CHF History: 1 HTN History: 1 Diabetes History: 1 Stroke History: 0 Vascular Disease History: 1 Age Score: 2 Gender Score: 1   CrCl 53ml/min Platelet count 277K  Routing back to Dr Renna Cary to see if a 3 day Eliquis  hold is acceptable due to patient's elevated risk score    **This guidance is not considered finalized until pre-operative APP has relayed final recommendations.**

## 2023-10-06 NOTE — Telephone Encounter (Signed)
   Name: Sophia Ward  DOB: Sep 22, 1932  MRN: [id number]   Primary Cardiologist: Dorothye Gathers, MD  Chart reviewed as part of pre-operative protocol coverage. Sophia Ward was last seen on 07/28/2023 by Dr. Renna Cary.  Per Dr. Renna Cary "Given the restenosis incident that occurred 20 years ago is very remote, I think it is reasonable to proceed with holding Plavix  prior to epidural injection.  Okay to hold for 5 days prior.  She would be at low risk at this point for in-stent thrombosis.  However she must understand that there is always a possible risk involved.  She may proceed."  Therefore, based on ACC/AHA guidelines, the patient would be an acceptable risk for the planned procedure without further cardiovascular testing.   Per office protocol, he may hold Plavix  for 5 days prior to procedure and should resume as soon as hemodynamically stable postoperatively.   Per Pharm D, patient may hold Eliquis  for 3 days prior to procedure. Per Dr. Renna Cary "3 day of Eliquis  hold is reasonable for procedure. Stroke risk is fairly small but unable to perform procedure without Eliquis  hold."   I will route this recommendation to the requesting party via Epic fax function and remove from pre-op pool. Please call with questions.  Morey Ar, NP 10/06/2023, 4:46 PM

## 2023-10-10 ENCOUNTER — Encounter: Payer: Self-pay | Admitting: Cardiology

## 2023-10-12 ENCOUNTER — Other Ambulatory Visit: Payer: Self-pay | Admitting: Physician Assistant

## 2023-10-12 NOTE — Telephone Encounter (Signed)
 Last Fill: 07/05/2023  Next Visit: 11/01/2023  Last Visit: 07/31/2023  Dx: Rheumatoid arthritis involving multiple sites with positive rheumatoid factor   Current Dose per office note on 07/31/2023: prednisone  2 mg by mouth daily   Okay to refill Prednisone ?

## 2023-10-17 DIAGNOSIS — M48062 Spinal stenosis, lumbar region with neurogenic claudication: Secondary | ICD-10-CM | POA: Diagnosis not present

## 2023-10-18 NOTE — Progress Notes (Deleted)
 Office Visit Note  Patient: Sophia Ward             Date of Birth: January 23, 1933           MRN: [id number]             PCP: Jearldine Mina, MD Referring: Jearldine Mina, MD Visit Date: 11/01/2023 Occupation: @GUAROCC @  Subjective:    History of Present Illness: Sophia Ward is a 89 y.o. female with history of seropositive rheumatoid and polymyalgia rheumatica.  Patient remains on  Arava  10 mg 1 tab every other day. Arava  was added 11/24/2022. She remains on prednisone  2 mg daily.   Hepatic function 07/31/23.  CBC updated on 07/31/23   Orders for CBC and CMP released today.  Discussed the importance of holding arava  if she develops signs or symptoms of an infection and to resume once the infection has completely cleared.      Activities of Daily Living:  Patient reports morning stiffness for *** {minute/hour:19697}.   Patient {ACTIONS;DENIES/REPORTS:21021675::"Denies"} nocturnal pain.  Difficulty dressing/grooming: {ACTIONS;DENIES/REPORTS:21021675::"Denies"} Difficulty climbing stairs: {ACTIONS;DENIES/REPORTS:21021675::"Denies"} Difficulty getting out of chair: {ACTIONS;DENIES/REPORTS:21021675::"Denies"} Difficulty using hands for taps, buttons, cutlery, and/or writing: {ACTIONS;DENIES/REPORTS:21021675::"Denies"}  No Rheumatology ROS completed.   PMFS History:  Patient Active Problem List   Diagnosis Date Noted   Acute on chronic diastolic CHF (congestive heart failure) (HCC) 11/01/2022   CKD stage 3a, GFR 45-59 ml/min (HCC) 11/01/2022   PAF (paroxysmal atrial fibrillation) (HCC) 11/01/2022   Heart failure (HCC) 11/01/2022   Acute on chronic diastolic heart failure (HCC) 03/04/2020   S/P TAVR (transcatheter aortic valve replacement) 03/03/2020   Diabetes mellitus without complication (HCC)    Pulmonary nodules    Lesion of pancreas    Severe aortic stenosis    Pressure injury of skin 01/15/2020   Acute respiratory failure with hypoxia (HCC) 01/13/2020   Iron  deficiency anemia 01/06/2020   Anemia 10/22/2019   Polymyalgia rheumatica (HCC) 11/25/2016   Primary osteoarthritis of both hands 11/25/2016   Bilateral primary osteoarthritis of knee 11/25/2016   Osteopenia of multiple sites 11/25/2016   Chronic gout involving toe without tophus 10/28/2016   Osteoarthritis of lumbar spine 10/28/2016   History of gastroesophageal reflux (GERD) 10/27/2016   Rheumatoid arthritis involving multiple sites with positive rheumatoid factor (HCC) 10/14/2016   Coronary atherosclerosis of native coronary artery 01/30/2014   Mixed hyperlipidemia 01/30/2014   Essential hypertension, benign 01/30/2014   Obesity, unspecified 01/30/2014    Past Medical History:  Diagnosis Date   Allergic rhinitis    Anemia 09/2019   Arthritis    hands   BPPV (benign paroxysmal positional vertigo)    Coronary artery disease    Diabetes mellitus without complication (HCC)    Essential hypertension, benign 01/30/2014   GERD (gastroesophageal reflux disease)    Hypertension    Lesion of pancreas    Mixed hyperlipidemia 01/30/2014   Myocardial infarction (HCC) 2004   mild MI-no damage- had stents   Osteopenia    Post-menopausal    Pulmonary nodules    Rheumatoid arthritis (HCC)    S/P TAVR (transcatheter aortic valve replacement) 03/03/2020   s/p TAVR with a 23 mm Edwards S3U via the TF approach by Drs Arlester Ladd and Alva Jewels   Severe aortic stenosis    Spinal stenosis    Stenosing tenosynovitis     Family History  Problem Relation Age of Onset   CAD Mother    Heart attack Mother    CAD Father    Heart  Problems Brother        open heart surgery    Arthritis Daughter        psoriatic arthritis    Past Surgical History:  Procedure Laterality Date   ABDOMINAL HYSTERECTOMY     APPENDECTOMY     BIOPSY  10/25/2019   Procedure: BIOPSY;  Surgeon: Felecia Hopper, MD;  Location: [facility];  Service: Gastroenterology;;   BIOPSY  10/26/2019   Procedure: BIOPSY;  Surgeon:  Felecia Hopper, MD;  Location: [facility];  Service: Gastroenterology;;   CARDIAC CATHETERIZATION  2004   carpel tunnel     COLONOSCOPY WITH PROPOFOL  N/A 10/30/2012   Procedure: COLONOSCOPY WITH PROPOFOL ;  Surgeon: Garrett Kallman, MD;  Location: [facility];  Service: Endoscopy;  Laterality: N/A;   COLONOSCOPY WITH PROPOFOL  N/A 10/26/2019   Procedure: COLONOSCOPY WITH PROPOFOL ;  Surgeon: Felecia Hopper, MD;  Location: [facility];  Service: Gastroenterology;  Laterality: N/A;   CORONARY ANGIOPLASTY  2004   CORONARY ATHERECTOMY N/A 02/21/2020   Procedure: CORONARY ATHERECTOMY;  Surgeon: Lucendia Rusk, MD;  Location: [facility] INVASIVE CV LAB;  Service: Cardiovascular;  Laterality: N/A;   CORONARY PRESSURE/FFR STUDY N/A 01/29/2020   Procedure: INTRAVASCULAR PRESSURE WIRE/FFR STUDY;  Surgeon: Lucendia Rusk, MD;  [facility] INVASIVE CV LAB;  Service: Cardiovascular;  Laterality: N/A;   CORONARY STENT INTERVENTION N/A 02/21/2020   Procedure: CORONARY STENT INTERVENTION;  Surgeon: Lucendia Rusk, MD;  Location: [facility] INVASIVE CV LAB;  Service: Cardiovascular;  Laterality: N/A;   CORONARY ULTRASOUND/IVUS N/A 02/21/2020   Procedure: Intravascular Ultrasound/IVUS;  Surgeon: Lucendia Rusk, MD;  Location: [facility] INVASIVE CV LAB;  Service: Cardiovascular;  Laterality: N/A;   DILATION AND CURETTAGE OF UTERUS     ESOPHAGOGASTRODUODENOSCOPY N/A 10/25/2019   Procedure: ESOPHAGOGASTRODUODENOSCOPY (EGD);  Surgeon: Felecia Hopper, MD;  Location: [facility] ENDOSCOPY;  Service: Gastroenterology;  Laterality: N/A;   ESOPHAGOGASTRODUODENOSCOPY (EGD) WITH PROPOFOL  N/A 10/30/2012   Procedure: ESOPHAGOGASTRODUODENOSCOPY (EGD) WITH PROPOFOL ;  Surgeon: Garrett Kallman, MD;  Location: [facility];  Service: Endoscopy;  Laterality: N/A;   EYE SURGERY     bilateral cataract with lens implant   EYE SURGERY     INJECTION OF SILICONE OIL Left 02/12/2019   Procedure: Injection Of Silicone Oil;  Surgeon: Jearline Minder, MD;  Location: [facility] OR;  Service: Ophthalmology;  Laterality: Left;   left shouler surgery     PHOTOCOAGULATION WITH LASER Left 02/12/2019   Procedure: Photocoagulation With Laser;  Surgeon: Jearline Minder, MD;  Location: [facility] OR;  Service: Ophthalmology;  Laterality: Left;   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 02/12/2019   Procedure: REPAIR OF COMPLEX TRACTION RETINAL DETACHMENT;  Surgeon: Jearline Minder, MD;  Location: [facility] OR;  Service: Ophthalmology;  Laterality: Left;   RIGHT/LEFT HEART CATH AND CORONARY ANGIOGRAPHY N/A 01/29/2020   Procedure: RIGHT/LEFT HEART CATH AND CORONARY ANGIOGRAPHY;  Surgeon: Lucendia Rusk, MD;  Location: [facility] INVASIVE CV LAB;  Service: Cardiovascular;  Laterality: N/A;   TEE WITHOUT CARDIOVERSION N/A 03/03/2020   Procedure: TRANSESOPHAGEAL ECHOCARDIOGRAM (TEE);  Surgeon: Arnoldo Lapping, MD;  Location: [facility] OR;  Service: Open Heart Surgery;  Laterality: N/A;   TONSILLECTOMY     TRANSCATHETER AORTIC VALVE REPLACEMENT, TRANSFEMORAL  03/03/2020   TRANSCATHETER AORTIC VALVE REPLACEMENT, TRANSFEMORAL N/A 03/03/2020   Procedure: TRANSCATHETER AORTIC VALVE REPLACEMENT, TRANSFEMORAL USING EDWARDS SAPIEN 3 23 MM AORTIC VALVE.;  Surgeon: Arnoldo Lapping, MD;  Location: [facility] OR;  Service: Open Heart Surgery;  Laterality: N/A;   VITRECTOMY 25 GAUGE WITH SCLERAL  BUCKLE Left 02/12/2019   Procedure: Vitrectomy 25 Gauge With Scleral Buckle;  Surgeon: Jearline Minder, MD;  Location: [facility] OR;  Service: Ophthalmology;  Laterality: Left;   Social History   Social History Narrative   Not on file   Immunization History  Administered Date(s) Administered   Fluad Quad(high Dose 65+) 03/04/2020   Influenza, High Dose Seasonal PF 03/09/2014, 04/09/2015, 03/16/2016, 02/25/2017, 03/20/2018   PFIZER(Purple Top)SARS-COV-2 Vaccination 06/11/2019, 07/01/2019, 03/19/2020   Tdap 10/31/2022   Zoster Recombinant(Shingrix) 02/07/2018, 06/04/2018     Objective: Vital Signs:  There were no vitals taken for this visit.   Physical Exam Vitals and nursing note reviewed.  Constitutional:      Appearance: She is well-developed.  HENT:     Head: Normocephalic and atraumatic.  Eyes:     Conjunctiva/sclera: Conjunctivae normal.  Cardiovascular:     Rate and Rhythm: Normal rate and regular rhythm.     Heart sounds: Normal heart sounds.  Pulmonary:     Effort: Pulmonary effort is normal.     Breath sounds: Normal breath sounds.  Abdominal:     General: Bowel sounds are normal.     Palpations: Abdomen is soft.  Musculoskeletal:     Cervical back: Normal range of motion.  Lymphadenopathy:     Cervical: No cervical adenopathy.  Skin:    General: Skin is warm and dry.     Capillary Refill: Capillary refill takes less than 2 seconds.  Neurological:     Mental Status: She is alert and oriented to person, place, and time.  Psychiatric:        Behavior: Behavior normal.      Musculoskeletal Exam: ***  CDAI Exam: CDAI Score: -- Patient Global: --; Provider Global: -- Swollen: --; Tender: -- Joint Exam 11/01/2023   No joint exam has been documented for this visit   There is currently no information documented on the homunculus. Go to the Rheumatology activity and complete the homunculus joint exam.  Investigation: No additional findings.  Imaging: No results found.  Recent Labs: Lab Results  Component Value Date   WBC 13.0 (H) 07/31/2023   HGB 12.2 07/31/2023   PLT 277 07/31/2023   NA 140 04/04/2023   K 4.4 04/04/2023   CL 103 04/04/2023   CO2 29 04/04/2023   GLUCOSE 96 04/04/2023   BUN 32 (H) 04/04/2023   CREATININE 1.36 (H) 04/04/2023   BILITOT 0.4 07/31/2023   ALKPHOS 54 10/31/2022   AST 17 07/31/2023   ALT 13 07/31/2023   PROT 7.2 07/31/2023   ALBUMIN 3.2 (L) 10/31/2022   CALCIUM  10.0 04/04/2023   GFRAA 66 10/16/2020    Speciality Comments: No specialty comments available.  Procedures:  No procedures performed Allergies:  Codeine, Glimepiride, Jardiance  [empagliflozin ], Metformin and related, and Penicillins   Assessment / Plan:     Visit Diagnoses: Rheumatoid arthritis involving multiple sites with positive rheumatoid factor (HCC)  High risk medication use  Polymyalgia rheumatica (HCC)  Primary osteoarthritis of both hands  Primary osteoarthritis of both knees  Primary osteoarthritis of both feet  History of humerus fracture  Degeneration of intervertebral disc of lumbar region without discogenic back pain or lower extremity pain  Osteopenia of multiple sites  Status post aortic valve replacement  History of hypertension  History of diabetes mellitus  History of hyperlipidemia  History of gastroesophageal reflux (GERD)  Chronic anticoagulation  Orders: No orders of the defined types were placed in this encounter.  No orders of the defined types  were placed in this encounter.   Face-to-face time spent with patient was *** minutes. Greater than 50% of time was spent in counseling and coordination of care.  Follow-Up Instructions: No follow-ups on file.   Romayne Clubs, PA-C  Note - This record has been created using Dragon software.  Chart creation errors have been sought, but may not always  have been located. Such creation errors do not reflect on  the standard of medical care.

## 2023-11-01 ENCOUNTER — Ambulatory Visit: Admitting: Physician Assistant

## 2023-11-01 DIAGNOSIS — M51369 Other intervertebral disc degeneration, lumbar region without mention of lumbar back pain or lower extremity pain: Secondary | ICD-10-CM

## 2023-11-01 DIAGNOSIS — Z8639 Personal history of other endocrine, nutritional and metabolic disease: Secondary | ICD-10-CM

## 2023-11-01 DIAGNOSIS — Z7901 Long term (current) use of anticoagulants: Secondary | ICD-10-CM

## 2023-11-01 DIAGNOSIS — Z952 Presence of prosthetic heart valve: Secondary | ICD-10-CM

## 2023-11-01 DIAGNOSIS — M19071 Primary osteoarthritis, right ankle and foot: Secondary | ICD-10-CM

## 2023-11-01 DIAGNOSIS — M19041 Primary osteoarthritis, right hand: Secondary | ICD-10-CM

## 2023-11-01 DIAGNOSIS — Z8781 Personal history of (healed) traumatic fracture: Secondary | ICD-10-CM

## 2023-11-01 DIAGNOSIS — M170 Bilateral primary osteoarthritis of knee: Secondary | ICD-10-CM

## 2023-11-01 DIAGNOSIS — M0579 Rheumatoid arthritis with rheumatoid factor of multiple sites without organ or systems involvement: Secondary | ICD-10-CM

## 2023-11-01 DIAGNOSIS — Z8679 Personal history of other diseases of the circulatory system: Secondary | ICD-10-CM

## 2023-11-01 DIAGNOSIS — M8589 Other specified disorders of bone density and structure, multiple sites: Secondary | ICD-10-CM

## 2023-11-01 DIAGNOSIS — Z8719 Personal history of other diseases of the digestive system: Secondary | ICD-10-CM

## 2023-11-01 DIAGNOSIS — M353 Polymyalgia rheumatica: Secondary | ICD-10-CM

## 2023-11-01 DIAGNOSIS — Z79899 Other long term (current) drug therapy: Secondary | ICD-10-CM

## 2023-11-15 ENCOUNTER — Other Ambulatory Visit: Payer: Self-pay | Admitting: Physician Assistant

## 2023-11-15 DIAGNOSIS — Z79899 Other long term (current) drug therapy: Secondary | ICD-10-CM

## 2023-11-15 NOTE — Telephone Encounter (Signed)
 Please schedule patient a follow up visit. Patient due June 2025. Thanks!   Follow-Up Instructions: Return in about 3 months (around 10/31/2023) for Rheumatoid arthritis.

## 2023-11-15 NOTE — Telephone Encounter (Signed)
 Last Fill: 07/31/2023  Labs: 07/31/2023  WBC count remains elevated.  Absolute neutrophils and monocytes elevated. Rest of CBC WNL.   Hepatic function panel WNL.   Next Visit: Please schedule patient a follow up visit. Patient due June 2025. Thanks!   Last Visit: 07/31/2023  DX: Rheumatoid arthritis involving multiple sites with positive rheumatoid factor   Current Dose per office note 07/31/2023: Arava  10 mg 1 tab every other day.   Patient advised she is due for labs. Patient states she will come tomorrow to update.   Okay to refill Arava  ?

## 2023-11-16 ENCOUNTER — Other Ambulatory Visit: Payer: Self-pay | Admitting: *Deleted

## 2023-11-16 DIAGNOSIS — Z79899 Other long term (current) drug therapy: Secondary | ICD-10-CM | POA: Diagnosis not present

## 2023-11-16 LAB — COMPREHENSIVE METABOLIC PANEL WITH GFR
AG Ratio: 1.5 (calc) (ref 1.0–2.5)
ALT: 16 U/L (ref 6–29)
AST: 17 U/L (ref 10–35)
Albumin: 3.8 g/dL (ref 3.6–5.1)
Alkaline phosphatase (APISO): 66 U/L (ref 37–153)
BUN/Creatinine Ratio: 15 (calc) (ref 6–22)
BUN: 22 mg/dL (ref 7–25)
CO2: 29 mmol/L (ref 20–32)
Calcium: 9.2 mg/dL (ref 8.6–10.4)
Chloride: 103 mmol/L (ref 98–110)
Creat: 1.45 mg/dL — ABNORMAL HIGH (ref 0.60–0.95)
Globulin: 2.6 g/dL (ref 1.9–3.7)
Glucose, Bld: 152 mg/dL — ABNORMAL HIGH (ref 65–99)
Potassium: 4.5 mmol/L (ref 3.5–5.3)
Sodium: 141 mmol/L (ref 135–146)
Total Bilirubin: 0.4 mg/dL (ref 0.2–1.2)
Total Protein: 6.4 g/dL (ref 6.1–8.1)
eGFR: 35 mL/min/{1.73_m2} — ABNORMAL LOW (ref >=60)

## 2023-11-16 LAB — CBC WITH DIFFERENTIAL/PLATELET
Absolute Lymphocytes: 1484 {cells}/uL (ref 850–3900)
Absolute Monocytes: 1213 {cells}/uL — ABNORMAL HIGH (ref 200–950)
Basophils Absolute: 77 {cells}/uL (ref 0–200)
Basophils Relative: 0.6 %
Eosinophils Absolute: 413 {cells}/uL (ref 15–500)
Eosinophils Relative: 3.2 %
HCT: 38.7 % (ref 35.0–45.0)
Hemoglobin: 12.4 g/dL (ref 11.7–15.5)
MCH: 30.2 pg (ref 27.0–33.0)
MCHC: 32 g/dL (ref 32.0–36.0)
MCV: 94.2 fL (ref 80.0–100.0)
MPV: 10.2 fL (ref 7.5–12.5)
Monocytes Relative: 9.4 %
Neutro Abs: 9714 {cells}/uL — ABNORMAL HIGH (ref 1500–7800)
Neutrophils Relative %: 75.3 %
Platelets: 374 10*3/uL (ref 140–400)
RBC: 4.11 10*6/uL (ref 3.80–5.10)
RDW: 12.4 % (ref 11.0–15.0)
Total Lymphocyte: 11.5 %
WBC: 12.9 10*3/uL — ABNORMAL HIGH (ref 3.8–10.8)

## 2023-11-16 NOTE — Progress Notes (Deleted)
 Office Visit Note  Patient: Sophia Ward             Date of Birth: 11/05/1932           MRN: [id number]             PCP: Jearldine Mina, MD Referring: Jearldine Mina, MD Visit Date: 11/20/2023 Occupation: @GUAROCC @  Subjective:    History of Present Illness: Sophia Ward is a 89 y.o. female with history of seropositive rheumatoid and polymyalgia rheumatica.  Patient remains on  Arava  10 mg 1 tab every other day. Arava  was added 11/24/2022. She remains on prednisone  2 mg daily.   Hepatic function 07/31/23.  CBC updated on 07/31/23   Orders for CBC and CMP released today.  Discussed the importance of holding arava  if she develops signs or symptoms of an infection and to resume once the infection has completely cleared.      Activities of Daily Living:  Patient reports morning stiffness for *** {minute/hour:19697}.   Patient {ACTIONS;DENIES/REPORTS:21021675::Denies} nocturnal pain.  Difficulty dressing/grooming: {ACTIONS;DENIES/REPORTS:21021675::Denies} Difficulty climbing stairs: {ACTIONS;DENIES/REPORTS:21021675::Denies} Difficulty getting out of chair: {ACTIONS;DENIES/REPORTS:21021675::Denies} Difficulty using hands for taps, buttons, cutlery, and/or writing: {ACTIONS;DENIES/REPORTS:21021675::Denies}  No Rheumatology ROS completed.   PMFS History:  Patient Active Problem List   Diagnosis Date Noted   Acute on chronic diastolic CHF (congestive heart failure) (HCC) 11/01/2022   CKD stage 3a, GFR 45-59 ml/min (HCC) 11/01/2022   PAF (paroxysmal atrial fibrillation) (HCC) 11/01/2022   Heart failure (HCC) 11/01/2022   Acute on chronic diastolic heart failure (HCC) 03/04/2020   S/P TAVR (transcatheter aortic valve replacement) 03/03/2020   Diabetes mellitus without complication (HCC)    Pulmonary nodules    Lesion of pancreas    Severe aortic stenosis    Pressure injury of skin 01/15/2020   Acute respiratory failure with hypoxia (HCC) 01/13/2020   Iron  deficiency anemia 01/06/2020   Anemia 10/22/2019   Polymyalgia rheumatica (HCC) 11/25/2016   Primary osteoarthritis of both hands 11/25/2016   Bilateral primary osteoarthritis of knee 11/25/2016   Osteopenia of multiple sites 11/25/2016   Chronic gout involving toe without tophus 10/28/2016   Osteoarthritis of lumbar spine 10/28/2016   History of gastroesophageal reflux (GERD) 10/27/2016   Rheumatoid arthritis involving multiple sites with positive rheumatoid factor (HCC) 10/14/2016   Coronary atherosclerosis of native coronary artery 01/30/2014   Mixed hyperlipidemia 01/30/2014   Essential hypertension, benign 01/30/2014   Obesity, unspecified 01/30/2014    Past Medical History:  Diagnosis Date   Allergic rhinitis    Anemia 09/2019   Arthritis    hands   BPPV (benign paroxysmal positional vertigo)    Coronary artery disease    Diabetes mellitus without complication (HCC)    Essential hypertension, benign 01/30/2014   GERD (gastroesophageal reflux disease)    Hypertension    Lesion of pancreas    Mixed hyperlipidemia 01/30/2014   Myocardial infarction (HCC) 2004   mild MI-no damage- had stents   Osteopenia    Post-menopausal    Pulmonary nodules    Rheumatoid arthritis (HCC)    S/P TAVR (transcatheter aortic valve replacement) 03/03/2020   s/p TAVR with a 23 mm Edwards S3U via the TF approach by Drs Arlester Ladd and Alva Jewels   Severe aortic stenosis    Spinal stenosis    Stenosing tenosynovitis     Family History  Problem Relation Age of Onset   CAD Mother    Heart attack Mother    CAD Father    Heart  Problems Brother        open heart surgery    Arthritis Daughter        psoriatic arthritis    Past Surgical History:  Procedure Laterality Date   ABDOMINAL HYSTERECTOMY     APPENDECTOMY     BIOPSY  10/25/2019   Procedure: BIOPSY;  Surgeon: Felecia Hopper, MD;  Location: [facility];  Service: Gastroenterology;;   BIOPSY  10/26/2019   Procedure: BIOPSY;  Surgeon:  Felecia Hopper, MD;  Location: [facility];  Service: Gastroenterology;;   CARDIAC CATHETERIZATION  2004   carpel tunnel     COLONOSCOPY WITH PROPOFOL  N/A 10/30/2012   Procedure: COLONOSCOPY WITH PROPOFOL ;  Surgeon: Garrett Kallman, MD;  Location: [facility];  Service: Endoscopy;  Laterality: N/A;   COLONOSCOPY WITH PROPOFOL  N/A 10/26/2019   Procedure: COLONOSCOPY WITH PROPOFOL ;  Surgeon: Felecia Hopper, MD;  Location: [facility];  Service: Gastroenterology;  Laterality: N/A;   CORONARY ANGIOPLASTY  2004   CORONARY ATHERECTOMY N/A 02/21/2020   Procedure: CORONARY ATHERECTOMY;  Surgeon: Lucendia Rusk, MD;  Location: [facility] INVASIVE CV LAB;  Service: Cardiovascular;  Laterality: N/A;   CORONARY PRESSURE/FFR STUDY N/A 01/29/2020   Procedure: INTRAVASCULAR PRESSURE WIRE/FFR STUDY;  Surgeon: Lucendia Rusk, MD;  Location: [facility] INVASIVE CV LAB;  Service: Cardiovascular;  Laterality: N/A;   CORONARY STENT INTERVENTION N/A 02/21/2020   Procedure: CORONARY STENT INTERVENTION;  Surgeon: Lucendia Rusk, MD;  Location: [facility] INVASIVE CV LAB;  Service: Cardiovascular;  Laterality: N/A;   CORONARY ULTRASOUND/IVUS N/A 02/21/2020   Procedure: Intravascular Ultrasound/IVUS;  Surgeon: Lucendia Rusk, MD;  Location: [facility] INVASIVE CV LAB;  Service: Cardiovascular;  Laterality: N/A;   DILATION AND CURETTAGE OF UTERUS     ESOPHAGOGASTRODUODENOSCOPY N/A 10/25/2019   Procedure: ESOPHAGOGASTRODUODENOSCOPY (EGD);  Surgeon: Felecia Hopper, MD;  Location: [facility] ENDOSCOPY;  Service: Gastroenterology;  Laterality: N/A;   ESOPHAGOGASTRODUODENOSCOPY (EGD) WITH PROPOFOL  N/A 10/30/2012   Procedure: ESOPHAGOGASTRODUODENOSCOPY (EGD) WITH PROPOFOL ;  Surgeon: Garrett Kallman, MD;  Location: [facility];  Service: Endoscopy;  Laterality: N/A;   EYE SURGERY     bilateral cataract with lens implant   EYE SURGERY     INJECTION OF SILICONE OIL Left 02/12/2019   Procedure: Injection Of Silicone Oil;  Surgeon: Jearline Minder, MD;  Location[facility];  Service: Ophthalmology;  Laterality: Left;   left shouler surgery     PHOTOCOAGULATION WITH LASER Left 02/12/2019   Procedure: Photocoagulation With Laser;  Surgeon: Jearline Minder, MD;  Location: [facility] OR;  Service: Ophthalmology;  Laterality: Left;   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 02/12/2019   Procedure: REPAIR OF COMPLEX TRACTION RETINAL DETACHMENT;  Surgeon: Jearline Minder, MD;  Location: [facility] OR;  Service: Ophthalmology;  Laterality: Left;   RIGHT/LEFT HEART CATH AND CORONARY ANGIOGRAPHY N/A 01/29/2020   Procedure: RIGHT/LEFT HEART CATH AND CORONARY ANGIOGRAPHY;  Surgeon: Lucendia Rusk, MD;  Location: [facility] INVASIVE CV LAB;  Service: Cardiovascular;  Laterality: N/A;   TEE WITHOUT CARDIOVERSION N/A 03/03/2020   Procedure: TRANSESOPHAGEAL ECHOCARDIOGRAM (TEE);  Surgeon: Arnoldo Lapping, MD;  Location: [facility] OR;  Service: Open Heart Surgery;  Laterality: N/A;   TONSILLECTOMY     TRANSCATHETER AORTIC VALVE REPLACEMENT, TRANSFEMORAL  03/03/2020   TRANSCATHETER AORTIC VALVE REPLACEMENT, TRANSFEMORAL N/A 03/03/2020   Procedure: TRANSCATHETER AORTIC VALVE REPLACEMENT, TRANSFEMORAL USING EDWARDS SAPIEN 3 23 MM AORTIC VALVE.;  Surgeon: Arnoldo Lapping, MD;  Location: [facility] OR;  Service: Open Heart Surgery;  Laterality: N/A;   VITRECTOMY 25 GAUGE WITH SCLERAL  BUCKLE Left 02/12/2019   Procedure: Vitrectomy 25 Gauge With Scleral Buckle;  Surgeon: Jearline Minder, MD;  Location: [facility] OR;  Service: Ophthalmology;  Laterality: Left;   Social History   Social History Narrative   Not on file   Immunization History  Administered Date(s) Administered   Fluad Quad(high Dose 65+) 03/04/2020   Influenza, High Dose Seasonal PF 03/09/2014, 04/09/2015, 03/16/2016, 02/25/2017, 03/20/2018   PFIZER(Purple Top)SARS-COV-2 Vaccination 06/11/2019, 07/01/2019, 03/19/2020   Tdap 10/31/2022   Zoster Recombinant(Shingrix) 02/07/2018, 06/04/2018     Objective: Vital Signs:  There were no vitals taken for this visit.   Physical Exam Vitals and nursing note reviewed.  Constitutional:      Appearance: She is well-developed.  HENT:     Head: Normocephalic and atraumatic.   Eyes:     Conjunctiva/sclera: Conjunctivae normal.    Cardiovascular:     Rate and Rhythm: Normal rate and regular rhythm.     Heart sounds: Normal heart sounds.  Pulmonary:     Effort: Pulmonary effort is normal.     Breath sounds: Normal breath sounds.  Abdominal:     General: Bowel sounds are normal.     Palpations: Abdomen is soft.   Musculoskeletal:     Cervical back: Normal range of motion.  Lymphadenopathy:     Cervical: No cervical adenopathy.   Skin:    General: Skin is warm and dry.     Capillary Refill: Capillary refill takes less than 2 seconds.   Neurological:     Mental Status: She is alert and oriented to person, place, and time.   Psychiatric:        Behavior: Behavior normal.      Musculoskeletal Exam: ***  CDAI Exam: CDAI Score: -- Patient Global: --; Provider Global: -- Swollen: --; Tender: -- Joint Exam 11/20/2023   No joint exam has been documented for this visit   There is currently no information documented on the homunculus. Go to the Rheumatology activity and complete the homunculus joint exam.  Investigation: No additional findings.  Imaging: No results found.  Recent Labs: Lab Results  Component Value Date   WBC 13.0 (H) 07/31/2023   HGB 12.2 07/31/2023   PLT 277 07/31/2023   NA 140 04/04/2023   K 4.4 04/04/2023   CL 103 04/04/2023   CO2 29 04/04/2023   GLUCOSE 96 04/04/2023   BUN 32 (H) 04/04/2023   CREATININE 1.36 (H) 04/04/2023   BILITOT 0.4 07/31/2023   ALKPHOS 54 10/31/2022   AST 17 07/31/2023   ALT 13 07/31/2023   PROT 7.2 07/31/2023   ALBUMIN 3.2 (L) 10/31/2022   CALCIUM  10.0 04/04/2023   GFRAA 66 10/16/2020    Speciality Comments: No specialty comments available.  Procedures:  No procedures  performed Allergies: Codeine, Glimepiride, Jardiance  [empagliflozin ], Metformin and related, and Penicillins   Assessment / Plan:     Visit Diagnoses: Rheumatoid arthritis involving multiple sites with positive rheumatoid factor (HCC)  High risk medication use  Polymyalgia rheumatica (HCC)  Primary osteoarthritis of both hands  Primary osteoarthritis of both knees  Primary osteoarthritis of both feet  History of humerus fracture  Degeneration of intervertebral disc of lumbar region without discogenic back pain or lower extremity pain  Osteopenia of multiple sites  Status post aortic valve replacement  History of hypertension  History of diabetes mellitus  History of hyperlipidemia  History of gastroesophageal reflux (GERD)  Chronic anticoagulation  Orders: No orders of the defined types were placed in this encounter.  No orders of the defined types were placed in this encounter.   Face-to-face time spent with patient was *** minutes. Greater than 50% of time was spent in counseling and coordination of care.  Follow-Up Instructions: No follow-ups on file.   Romayne Clubs, PA-C  Note - This record has been created using Dragon software.  Chart creation errors have been sought, but may not always  have been located. Such creation errors do not reflect on  the standard of medical care.

## 2023-11-17 ENCOUNTER — Ambulatory Visit: Payer: Self-pay | Admitting: Rheumatology

## 2023-11-17 NOTE — Progress Notes (Signed)
 Glucose is elevated at 152, creatinine is elevated at 1.45, most likely due to diuretic use.  Please forward results to her PCP.  White cell count is elevated and stable.

## 2023-11-20 ENCOUNTER — Ambulatory Visit: Admitting: Physician Assistant

## 2023-11-20 DIAGNOSIS — Z952 Presence of prosthetic heart valve: Secondary | ICD-10-CM

## 2023-11-20 DIAGNOSIS — Z8639 Personal history of other endocrine, nutritional and metabolic disease: Secondary | ICD-10-CM

## 2023-11-20 DIAGNOSIS — Z79899 Other long term (current) drug therapy: Secondary | ICD-10-CM

## 2023-11-20 DIAGNOSIS — Z7901 Long term (current) use of anticoagulants: Secondary | ICD-10-CM

## 2023-11-20 DIAGNOSIS — Z8679 Personal history of other diseases of the circulatory system: Secondary | ICD-10-CM

## 2023-11-20 DIAGNOSIS — M353 Polymyalgia rheumatica: Secondary | ICD-10-CM

## 2023-11-20 DIAGNOSIS — M0579 Rheumatoid arthritis with rheumatoid factor of multiple sites without organ or systems involvement: Secondary | ICD-10-CM

## 2023-11-20 DIAGNOSIS — Z8719 Personal history of other diseases of the digestive system: Secondary | ICD-10-CM

## 2023-11-20 DIAGNOSIS — M51369 Other intervertebral disc degeneration, lumbar region without mention of lumbar back pain or lower extremity pain: Secondary | ICD-10-CM

## 2023-11-20 DIAGNOSIS — M19041 Primary osteoarthritis, right hand: Secondary | ICD-10-CM

## 2023-11-20 DIAGNOSIS — M19071 Primary osteoarthritis, right ankle and foot: Secondary | ICD-10-CM

## 2023-11-20 DIAGNOSIS — Z8781 Personal history of (healed) traumatic fracture: Secondary | ICD-10-CM

## 2023-11-20 DIAGNOSIS — M170 Bilateral primary osteoarthritis of knee: Secondary | ICD-10-CM

## 2023-11-20 DIAGNOSIS — M8589 Other specified disorders of bone density and structure, multiple sites: Secondary | ICD-10-CM

## 2023-11-21 DIAGNOSIS — R1084 Generalized abdominal pain: Secondary | ICD-10-CM | POA: Diagnosis not present

## 2023-11-21 DIAGNOSIS — R82998 Other abnormal findings in urine: Secondary | ICD-10-CM | POA: Diagnosis not present

## 2023-11-21 DIAGNOSIS — R0789 Other chest pain: Secondary | ICD-10-CM | POA: Diagnosis not present

## 2023-11-21 DIAGNOSIS — R300 Dysuria: Secondary | ICD-10-CM | POA: Diagnosis not present

## 2023-12-04 NOTE — Progress Notes (Unsigned)
 Office Visit Note  Patient: Sophia Ward             Date of Birth: January 25, 1933           MRN: [id number]             PCP: Ransom Other, MD Referring: Ransom Other, MD Visit Date: 12/07/2023 Occupation: @GUAROCC @  Subjective:  Medication monitoring  History of Present Illness: PHYILLIS DASCOLI is a 89 y.o. female with history of seropositive rheumatoid and polymyalgia rheumatica.  Patient remains on  Arava  10 mg 1 tab every other day. Arava  was added 11/24/2022. She remains on prednisone  2 mg daily.  She is tolerating Arava  without any side effects.  Patient reports that she had to cancel her last office visit due to being diagnosed with UTI.  Patient states that she was treated with a course of antibiotics during which she held her Arava .  She denies any other gaps in therapy.  She denies any signs or symptoms of a rheumatoid arthritis flare.  She has noted some increased deformity and swelling in her right 2nd and 3rd toe but otherwise has not noticed any changes in her symptoms. Patient states that she is scheduled for an upcoming physical in August 2025.     Activities of Daily Living:  Patient reports morning stiffness for 1-2 hours.   Patient Denies nocturnal pain.  Difficulty dressing/grooming: Denies Difficulty climbing stairs: Reports Difficulty getting out of chair: Reports Difficulty using hands for taps, buttons, cutlery, and/or writing: Reports  Review of Systems  Constitutional:  Positive for fatigue.  HENT:  Positive for mouth dryness. Negative for mouth sores.   Eyes:  Positive for dryness.  Respiratory:  Positive for shortness of breath.   Cardiovascular:  Negative for chest pain and palpitations.  Gastrointestinal:  Positive for diarrhea. Negative for blood in stool and constipation.  Endocrine: Negative for increased urination.  Genitourinary:  Negative for involuntary urination.  Musculoskeletal:  Positive for joint pain, gait problem, joint pain,  myalgias, muscle weakness, morning stiffness, muscle tenderness and myalgias. Negative for joint swelling.  Skin:  Positive for rash. Negative for color change, hair loss and sensitivity to sunlight.  Allergic/Immunologic: Positive for susceptible to infections.  Neurological:  Negative for dizziness and headaches.  Hematological:  Negative for swollen glands.  Psychiatric/Behavioral:  Negative for depressed mood and sleep disturbance. The patient is not nervous/anxious.     PMFS History:  Patient Active Problem List   Diagnosis Date Noted   Acute on chronic diastolic CHF (congestive heart failure) (HCC) 11/01/2022   CKD stage 3a, GFR 45-59 ml/min (HCC) 11/01/2022   PAF (paroxysmal atrial fibrillation) (HCC) 11/01/2022   Heart failure (HCC) 11/01/2022   Acute on chronic diastolic heart failure (HCC) 03/04/2020   S/P TAVR (transcatheter aortic valve replacement) 03/03/2020   Diabetes mellitus without complication (HCC)    Pulmonary nodules    Lesion of pancreas    Severe aortic stenosis    Pressure injury of skin 01/15/2020   Acute respiratory failure with hypoxia (HCC) 01/13/2020   Iron deficiency anemia 01/06/2020   Anemia 10/22/2019   Polymyalgia rheumatica (HCC) 11/25/2016   Primary osteoarthritis of both hands 11/25/2016   Bilateral primary osteoarthritis of knee 11/25/2016   Osteopenia of multiple sites 11/25/2016   Chronic gout involving toe without tophus 10/28/2016   Osteoarthritis of lumbar spine 10/28/2016   History of gastroesophageal reflux (GERD) 10/27/2016   Rheumatoid arthritis involving multiple sites with positive rheumatoid factor (HCC) 10/14/2016  Coronary atherosclerosis of native coronary artery 01/30/2014   Mixed hyperlipidemia 01/30/2014   Essential hypertension, benign 01/30/2014   Obesity, unspecified 01/30/2014    Past Medical History:  Diagnosis Date   Allergic rhinitis    Anemia 09/2019   Arthritis    hands   BPPV (benign paroxysmal positional  vertigo)    Coronary artery disease    Diabetes mellitus without complication (HCC)    Essential hypertension, benign 01/30/2014   GERD (gastroesophageal reflux disease)    Hypertension    Lesion of pancreas    Mixed hyperlipidemia 01/30/2014   Myocardial infarction (HCC) 2004   mild MI-no damage- had stents   Osteopenia    Post-menopausal    Pulmonary nodules    Rheumatoid arthritis (HCC)    S/P TAVR (transcatheter aortic valve replacement) 03/03/2020   s/p TAVR with a 23 mm Edwards S3U via the TF approach by Drs Wonda and Dusty   Severe aortic stenosis    Spinal stenosis    Stenosing tenosynovitis     Family History  Problem Relation Age of Onset   CAD Mother    Heart attack Mother    CAD Father    Heart Problems Brother        open heart surgery    Arthritis Daughter        psoriatic arthritis    Past Surgical History:  Procedure Laterality Date   ABDOMINAL HYSTERECTOMY     APPENDECTOMY     BIOPSY  10/25/2019   Procedure: BIOPSY;  Surgeon: Elicia Claw, MD;  Location: [facility];  Service: Gastroenterology;;   BIOPSY  10/26/2019   Procedure: BIOPSY;  Surgeon: Elicia Claw, MD;  Location: [facility];  Service: Gastroenterology;;   CARDIAC CATHETERIZATION  2004   carpel tunnel     COLONOSCOPY WITH PROPOFOL  N/A 10/30/2012   Procedure: COLONOSCOPY WITH PROPOFOL ;  Surgeon: Gladis MARLA Louder, MD;  Location: [facility];  Service: Endoscopy;  Laterality: N/A;   COLONOSCOPY WITH PROPOFOL  N/A 10/26/2019   Procedure: COLONOSCOPY WITH PROPOFOL ;  Surgeon: Elicia Claw, MD;  Location: [facility];  Service: Gastroenterology;  Laterality: N/A;   CORONARY ANGIOPLASTY  2004   CORONARY ATHERECTOMY N/A 02/21/2020   Procedure: CORONARY ATHERECTOMY;  Surgeon: Dann Candyce RAMAN, MD;  Location: [facility] INVASIVE CV LAB;  Service: Cardiovascular;  Laterality: N/A;   CORONARY PRESSURE/FFR STUDY N/A 01/29/2020   Procedure: INTRAVASCULAR PRESSURE WIRE/FFR STUDY;  Surgeon: Dann Candyce RAMAN, MD;  Location: [facility] INVASIVE CV LAB;  Service: Cardiovascular;  Laterality: N/A;   CORONARY STENT INTERVENTION N/A 02/21/2020   Procedure: CORONARY STENT INTERVENTION;  Surgeon: Dann Candyce RAMAN, MD;  Location: [facility] INVASIVE CV LAB;  Service: Cardiovascular;  Laterality: N/A;   CORONARY ULTRASOUND/IVUS N/A 02/21/2020   Procedure: Intravascular Ultrasound/IVUS;  Surgeon: Dann Candyce RAMAN, MD;  Location: [facility] INVASIVE CV LAB;  Service: Cardiovascular;  Laterality: N/A;   DILATION AND CURETTAGE OF UTERUS     ESOPHAGOGASTRODUODENOSCOPY N/A 10/25/2019   Procedure: ESOPHAGOGASTRODUODENOSCOPY (EGD);  Surgeon: Elicia Claw, MD;  Location: [facility] ENDOSCOPY;  Service: Gastroenterology;  Laterality: N/A;   ESOPHAGOGASTRODUODENOSCOPY (EGD) WITH PROPOFOL  N/A 10/30/2012   Procedure: ESOPHAGOGASTRODUODENOSCOPY (EGD) WITH PROPOFOL ;  Surgeon: Gladis MARLA Louder, MD;  Location: [facility];  Service: Endoscopy;  Laterality: N/A;   EYE SURGERY     bilateral cataract with lens implant   EYE SURGERY     INJECTION OF SILICONE OIL Left 02/12/2019   Procedure: Injection Of Silicone Oil;  Surgeon: Tobie Baptist, MD;  Location: [facility] OR;  Service: Ophthalmology;  Laterality: Left;   left shouler surgery     PHOTOCOAGULATION WITH LASER Left 02/12/2019   Procedure: Photocoagulation With Laser;  Surgeon: Tobie Baptist, MD;  Location: [facility] OR;  Service: Ophthalmology;  Laterality: Left;   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 02/12/2019   Procedure: REPAIR OF COMPLEX TRACTION RETINAL DETACHMENT;  Surgeon: Tobie Baptist, MD;  Location: [facility] OR;  Service: Ophthalmology;  Laterality: Left;   RIGHT/LEFT HEART CATH AND CORONARY ANGIOGRAPHY N/A 01/29/2020   Procedure: RIGHT/LEFT HEART CATH AND CORONARY ANGIOGRAPHY;  Surgeon: Dann Candyce RAMAN, MD;  Location: [facility] INVASIVE CV LAB;  Service: Cardiovascular;  Laterality: N/A;   TEE WITHOUT CARDIOVERSION N/A 03/03/2020   Procedure: TRANSESOPHAGEAL ECHOCARDIOGRAM  (TEE);  Surgeon: Wonda Sharper, MD;  Location: [facility] OR;  Service: Open Heart Surgery;  Laterality: N/A;   TONSILLECTOMY     TRANSCATHETER AORTIC VALVE REPLACEMENT, TRANSFEMORAL  03/03/2020   TRANSCATHETER AORTIC VALVE REPLACEMENT, TRANSFEMORAL N/A 03/03/2020   Procedure: TRANSCATHETER AORTIC VALVE REPLACEMENT, TRANSFEMORAL USING EDWARDS SAPIEN 3 23 MM AORTIC VALVE.;  Surgeon: Wonda Sharper, MD;  Location: [facility] OR;  Service: Open Heart Surgery;  Laterality: N/A;   VITRECTOMY 25 GAUGE WITH SCLERAL BUCKLE Left 02/12/2019   Procedure: Vitrectomy 25 Gauge With Scleral Buckle;  Surgeon: Tobie Baptist, MD;  Location: [facility] OR;  Service: Ophthalmology;  Laterality: Left;   Social History   Social History Narrative   Not on file   Immunization History  Administered Date(s) Administered   Fluad Quad(high Dose 65+) 03/04/2020   Influenza, High Dose Seasonal PF 03/09/2014, 04/09/2015, 03/16/2016, 02/25/2017, 03/20/2018   PFIZER(Purple Top)SARS-COV-2 Vaccination 06/11/2019, 07/01/2019, 03/19/2020   Tdap 10/31/2022   Zoster Recombinant(Shingrix) 02/07/2018, 06/04/2018     Objective: Vital Signs: BP 109/66 (BP Location: Right Arm, Patient Position: Sitting, Cuff Size: Normal)   Pulse 65   Resp 16   Ht 5' 2.5 (1.588 m)   Wt 172 lb (78 kg)   BMI 30.96 kg/m    Physical Exam Vitals and nursing note reviewed.  Constitutional:      Appearance: She is well-developed.  HENT:     Head: Normocephalic and atraumatic.  Eyes:     Conjunctiva/sclera: Conjunctivae normal.  Cardiovascular:     Rate and Rhythm: Normal rate and regular rhythm.     Heart sounds: Murmur heard.  Pulmonary:     Effort: Pulmonary effort is normal.     Breath sounds: Normal breath sounds.  Abdominal:     General: Bowel sounds are normal.     Palpations: Abdomen is soft.  Musculoskeletal:     Cervical back: Normal range of motion.  Lymphadenopathy:     Cervical: No cervical adenopathy.  Skin:    General: Skin is warm and  dry.     Capillary Refill: Capillary refill takes less than 2 seconds.  Neurological:     Mental Status: She is alert and oriented to person, place, and time.  Psychiatric:        Behavior: Behavior normal.      Musculoskeletal Exam: C-spine is limited range of motion.  Thoracic kyphosis noted.  Limited mobility of the lumbar spine.  Shoulder joints have slightly limited internal rotation.  No sternoclavicular joint tenderness or inflammation noted.  Elbow joints have good range of motion with no tenderness along the joint line.  CMC joint prominence noted bilaterally.  PIP and DIP thickening consistent with osteoarthritis of both hands.  Subluxation of several DIP joints.  No tenderness or synovitis over MCP  joints.  Hip joints have slightly limited range of motion but no groin pain currently.  Knee joints have good range of motion with no warmth or effusion.  Ankle joints have good range of motion with no tenderness or joint swelling.  Mild tenderness over the right third MTP joint.  PIP and DIP thickening consistent with osteoarthritis of both feet.  No synovitis noted.  CDAI Exam: CDAI Score: -- Patient Global: --; Provider Global: -- Swollen: --; Tender: -- Joint Exam 12/07/2023   No joint exam has been documented for this visit   There is currently no information documented on the homunculus. Go to the Rheumatology activity and complete the homunculus joint exam.  Investigation: No additional findings.  Imaging: No results found.  Recent Labs: Lab Results  Component Value Date   WBC 12.9 (H) 11/16/2023   HGB 12.4 11/16/2023   PLT 374 11/16/2023   NA 141 11/16/2023   K 4.5 11/16/2023   CL 103 11/16/2023   CO2 29 11/16/2023   GLUCOSE 152 (H) 11/16/2023   BUN 22 11/16/2023   CREATININE 1.45 (H) 11/16/2023   BILITOT 0.4 11/16/2023   ALKPHOS 54 10/31/2022   AST 17 11/16/2023   ALT 16 11/16/2023   PROT 6.4 11/16/2023   ALBUMIN 3.2 (L) 10/31/2022   CALCIUM  9.2 11/16/2023    GFRAA 66 10/16/2020    Speciality Comments: No specialty comments available.  Procedures:  No procedures performed Allergies: Codeine, Glimepiride, Jardiance  [empagliflozin ], Metformin and related, and Penicillins   Assessment / Plan:     Visit Diagnoses: Rheumatoid arthritis involving multiple sites with positive rheumatoid factor (HCC) - - RF 309, ANA negative, CRP 5.8, synovitis: She has no synovitis on examination today.  She has not had any signs or symptoms of a rheumatoid arthritis flare.  She has clinically been doing well taking Arava  10 mg 1 tablet by mouth every other day and prednisone  2 mg daily.  No active inflammation was noted on exam.  She has mild tenderness over the right third MTP joint. No medication changes will be made at this time.  She was advised to notify us  if she develops any signs or symptoms of a flare.  She will follow-up in the office in 5 months or sooner if needed.  High risk medication use - Arava  10 mg 1 tab every other day.  Arava  was added  11/24/2022. prednisone  2 mg daily. D/c mtx due to elevated cr (methotrexate  2.5 mg 3 tab q7 days). CBC and CMP updated 11/16/23.  Her next lab work will be due in September and every 3 months.  Standing orders for CBC and CMP remain in place.  Lipid panel and hgb A1c followed by PCP and endocrinologist--plan to call for records. Discussed the importance of holding arava  if she develops signs or symptoms of an infection and to resume once the infection has completely cleared.    Polymyalgia rheumatica (HCC) -No signs or symptoms of a polymyalgia rheumatica flare.  No difficulty raising her arms above her head or rising from a seated position.  She remains on prednisone  2 mg daily along with low-dose Arava  10 mg every other day.  She will notify us  if she develops any new or worsening symptoms.  Primary osteoarthritis of both hands: She has PIP and DIP thickening consistent with osteoarthritis of both hands.  CMC joint  prominence noted bilaterally.  Subluxation of several DIP joints.  No active inflammation noted on exam.  Primary osteoarthritis of both knees: She  has good range of motion of both knee joints on examination today.  No warmth or effusion noted.  Primary osteoarthritis of both feet: She has PIP and DIP thickening consistent with osteoarthritis of both feet.  Overcrowding of toes noted.  Mild tenderness over the right third MTP joint  Degeneration of intervertebral disc of lumbar region without discogenic back pain or lower extremity pain - Chronic pain.  Spinal stenosis.  Followed by Dr. Cesario.  Underwent an injection on 02/23/23.  Intermittent discomfort.  Osteopenia of multiple sites - DEXA 05/15/18:BMD LFN0.820 g/cm2  T-score of -1.6.  She is taking a calcium  and vitamin D supplement daily. She remains on prednisone  2 mg daily long term. No recent falls.  Other medical conditions are listed as follows:  Status post aortic valve replacement  History of hypertension: Blood pressure was 109/66 today in the office.  History of diabetes mellitus  History of hyperlipidemia  History of gastroesophageal reflux (GERD)  Chronic anticoagulation  History of humerus fracture  Orders: No orders of the defined types were placed in this encounter.  No orders of the defined types were placed in this encounter.    Follow-Up Instructions: Return in about 5 months (around 05/08/2024) for Rheumatoid arthritis.   Waddell CHRISTELLA Craze, PA-C  Note - This record has been created using Dragon software.  Chart creation errors have been sought, but may not always  have been located. Such creation errors do not reflect on  the standard of medical care.

## 2023-12-07 ENCOUNTER — Ambulatory Visit: Attending: Physician Assistant | Admitting: Physician Assistant

## 2023-12-07 ENCOUNTER — Encounter: Payer: Self-pay | Admitting: Physician Assistant

## 2023-12-07 VITALS — BP 109/66 | HR 65 | Resp 16 | Ht 62.5 in | Wt 172.0 lb

## 2023-12-07 DIAGNOSIS — M51369 Other intervertebral disc degeneration, lumbar region without mention of lumbar back pain or lower extremity pain: Secondary | ICD-10-CM | POA: Insufficient documentation

## 2023-12-07 DIAGNOSIS — M19071 Primary osteoarthritis, right ankle and foot: Secondary | ICD-10-CM | POA: Insufficient documentation

## 2023-12-07 DIAGNOSIS — M19041 Primary osteoarthritis, right hand: Secondary | ICD-10-CM | POA: Diagnosis not present

## 2023-12-07 DIAGNOSIS — M353 Polymyalgia rheumatica: Secondary | ICD-10-CM | POA: Diagnosis present

## 2023-12-07 DIAGNOSIS — Z8719 Personal history of other diseases of the digestive system: Secondary | ICD-10-CM | POA: Insufficient documentation

## 2023-12-07 DIAGNOSIS — M19042 Primary osteoarthritis, left hand: Secondary | ICD-10-CM | POA: Diagnosis present

## 2023-12-07 DIAGNOSIS — M8589 Other specified disorders of bone density and structure, multiple sites: Secondary | ICD-10-CM | POA: Diagnosis present

## 2023-12-07 DIAGNOSIS — Z952 Presence of prosthetic heart valve: Secondary | ICD-10-CM | POA: Insufficient documentation

## 2023-12-07 DIAGNOSIS — Z8679 Personal history of other diseases of the circulatory system: Secondary | ICD-10-CM | POA: Insufficient documentation

## 2023-12-07 DIAGNOSIS — M19072 Primary osteoarthritis, left ankle and foot: Secondary | ICD-10-CM | POA: Insufficient documentation

## 2023-12-07 DIAGNOSIS — Z7901 Long term (current) use of anticoagulants: Secondary | ICD-10-CM | POA: Diagnosis not present

## 2023-12-07 DIAGNOSIS — M170 Bilateral primary osteoarthritis of knee: Secondary | ICD-10-CM | POA: Insufficient documentation

## 2023-12-07 DIAGNOSIS — Z8781 Personal history of (healed) traumatic fracture: Secondary | ICD-10-CM | POA: Insufficient documentation

## 2023-12-07 DIAGNOSIS — Z79899 Other long term (current) drug therapy: Secondary | ICD-10-CM | POA: Insufficient documentation

## 2023-12-07 DIAGNOSIS — M0579 Rheumatoid arthritis with rheumatoid factor of multiple sites without organ or systems involvement: Secondary | ICD-10-CM | POA: Insufficient documentation

## 2023-12-07 DIAGNOSIS — Z8639 Personal history of other endocrine, nutritional and metabolic disease: Secondary | ICD-10-CM | POA: Diagnosis present

## 2023-12-07 NOTE — Patient Instructions (Signed)
 Standing Labs We placed an order today for your standing lab work.   Please have your standing labs drawn in September and every 3 months   Please have your labs drawn 2 weeks prior to your appointment so that the provider can discuss your lab results at your appointment, if possible.  Please note that you may see your imaging and lab results in MyChart before we have reviewed them. We will contact you once all results are reviewed. Please allow our office up to 72 hours to thoroughly review all of the results before contacting the office for clarification of your results.  WALK-IN LAB HOURS  Monday through Thursday from 8:00 am -12:30 pm and 1:00 pm-4:30 pm and Friday from 8:00 am-12:00 pm.  Patients with office visits requiring labs will be seen before walk-in labs.  You may encounter longer than normal wait times. Please allow additional time. Wait times may be shorter on  Monday and Thursday afternoons.  We do not book appointments for walk-in labs. We appreciate your patience and understanding with our staff.   Labs are drawn by [facility]. Please bring your co-pay at the time of your lab draw.  You may receive a bill from [facility] for your lab work.  Please note if you are on Hydroxychloroquine and and an order has been placed for a Hydroxychloroquine level,  you will need to have it drawn 4 hours or more after your last dose.  If you wish to have your labs drawn at another location, please call the office 24 hours in advance so we can fax the orders.  The office is located at [address], [address], [place], [place] [address]   If you have any questions regarding directions or hours of operation,  please call [phone].   As a reminder, please drink plenty of water prior to coming for your lab work. Thanks!

## 2024-01-05 NOTE — Telephone Encounter (Signed)
 Contacted patient to advise Patient has been on prednisone  for many years, so it is unlikely to be related since her dose is unchanged.  I would recommend evaluation by PCP to rule out infectious cause.Patient verbalized understanding

## 2024-01-08 ENCOUNTER — Other Ambulatory Visit: Payer: Self-pay | Admitting: Physician Assistant

## 2024-01-08 NOTE — Telephone Encounter (Signed)
 Last Fill: 10/12/2023  Next Visit: 05/09/2024  Last Visit: 12/07/2023  Dx: Rheumatoid arthritis involving multiple sites with positive rheumatoid factor   Current Dose per office note on 12/07/2023: prednisone  2 mg daily   Okay to refill Prednisone ?

## 2024-01-09 DIAGNOSIS — R300 Dysuria: Secondary | ICD-10-CM | POA: Diagnosis not present

## 2024-01-09 DIAGNOSIS — N390 Urinary tract infection, site not specified: Secondary | ICD-10-CM | POA: Diagnosis not present

## 2024-01-09 DIAGNOSIS — R350 Frequency of micturition: Secondary | ICD-10-CM | POA: Diagnosis not present

## 2024-01-19 DIAGNOSIS — Z0000 Encounter for general adult medical examination without abnormal findings: Secondary | ICD-10-CM | POA: Diagnosis not present

## 2024-01-19 DIAGNOSIS — E1122 Type 2 diabetes mellitus with diabetic chronic kidney disease: Secondary | ICD-10-CM | POA: Diagnosis not present

## 2024-01-19 DIAGNOSIS — D509 Iron deficiency anemia, unspecified: Secondary | ICD-10-CM | POA: Diagnosis not present

## 2024-01-19 DIAGNOSIS — M8588 Other specified disorders of bone density and structure, other site: Secondary | ICD-10-CM | POA: Diagnosis not present

## 2024-01-19 DIAGNOSIS — Z794 Long term (current) use of insulin: Secondary | ICD-10-CM | POA: Diagnosis not present

## 2024-01-19 DIAGNOSIS — N1831 Chronic kidney disease, stage 3a: Secondary | ICD-10-CM | POA: Diagnosis not present

## 2024-01-19 DIAGNOSIS — Z1331 Encounter for screening for depression: Secondary | ICD-10-CM | POA: Diagnosis not present

## 2024-01-19 DIAGNOSIS — I25119 Atherosclerotic heart disease of native coronary artery with unspecified angina pectoris: Secondary | ICD-10-CM | POA: Diagnosis not present

## 2024-01-19 DIAGNOSIS — I480 Paroxysmal atrial fibrillation: Secondary | ICD-10-CM | POA: Diagnosis not present

## 2024-01-19 DIAGNOSIS — M109 Gout, unspecified: Secondary | ICD-10-CM | POA: Diagnosis not present

## 2024-01-19 DIAGNOSIS — I10 Essential (primary) hypertension: Secondary | ICD-10-CM | POA: Diagnosis not present

## 2024-01-19 DIAGNOSIS — I272 Pulmonary hypertension, unspecified: Secondary | ICD-10-CM | POA: Diagnosis not present

## 2024-01-19 DIAGNOSIS — I5030 Unspecified diastolic (congestive) heart failure: Secondary | ICD-10-CM | POA: Diagnosis not present

## 2024-01-19 DIAGNOSIS — M069 Rheumatoid arthritis, unspecified: Secondary | ICD-10-CM | POA: Diagnosis not present

## 2024-01-19 DIAGNOSIS — D6869 Other thrombophilia: Secondary | ICD-10-CM | POA: Diagnosis not present

## 2024-01-19 DIAGNOSIS — E782 Mixed hyperlipidemia: Secondary | ICD-10-CM | POA: Diagnosis not present

## 2024-01-30 NOTE — Progress Notes (Unsigned)
 Cardiology Office Note    Date:  02/01/2024  ID:  Sophia Ward, DOB Jun 09, 1932, MRN [id number] PCP:  Ransom Other, MD  Cardiologist:  Oneil Parchment, MD  Electrophysiologist:  None   Chief Complaint: f/u CAD, PAF, HFpEF  History of Present Illness: .    Sophia Ward is a 89 y.o. female with visit-pertinent history of CAD (AMI 2004 s/p PCI to LAD with early ISR with repeat stenting 5 months later, PCI to [facility] and pRCA 2021), chronic HFpEF, pulm HTN, CKD 3b, paroxysmal atrial fibrillation, aortic stenosis s/p TAVR 2021, prior ABLA anemia, hypertension, diabetes, RA, spinal stenosis seen for follow-up.    She has very complex hx with remote PCI 2004. She was admitted in 09/2019 with Hgb 7.9. She underwent EGD and colonoscopy which were unremarkable except for a few ulcers in the terminal ileum. During this admission she was also noted to go into new PAF. She was also found to have progression of her aortic stenosis with plan to proceed with TAVR workup. That year was also complicated by UTI/PNA and decompensated HF in 12/2019. She recovered and underwent cath 01/2020 with subsequent PCI of the ostial RCA, followed by orbital atherectomy and PCI of the left main/LAD. She underwent TAVR 02/2020. Given anemia she was initially treated with DAPT off Eliquis . Eliquis  was later resumed with continuation of Plavix . She was readmitted 10/2022 with mechanical fall, volume overload, and UTI. Echo showed normal LVEF 60-65%, GIIDD, RV normal w/ severely elevated PASP, 62.5 mmHg. No MR or MS. Aov was very poorly visualized however appeared calcified w/ gradients c/w mild-mod AS. TR was mild-mod. Chest CT was negative for PE. It was felt that her pulmonary hypertension is largely due to left-sided heart disease w/ treatment management being diuretics (IV->PO). It was not believed she would benefit from evaluation for Group 1 pulmonary hypertension medications or RHC. It was recommended to continue with  conservative approach given her age. Patient was in agreement with this as well. She was not placed on SGLT2i given UTI. She later stopped losartan  in follow-up due to lightheadedness.  She returns for follow-up with her daughter. She has been doing well. Denies any CP, SOB, palpitations, edema, syncope, bleeding or any new concerns. Thinks she stopped potassium supplement a long time ago, removed from med list. She reports that her dentist prescribes her dental prophylaxis for her before all dental procedures (azithromycin ).  Labwork independently reviewed: 12/2023 KPN K 4.0, Cr not reported, Hgb 12.7, LDL 86, trig 149, TSH OK 10/2023 Hgb 12.4, plt 374, K 4.5, Cr 1.45 PCP KPN 01/16/23 Tchol 166, trig 203, LDL 75, Cr 1.370, Hgb 12.7, TSH wnl  10/2022 Mg last 2.4, trops neg  ROS: .    Please see the history of present illness.  All other systems are reviewed and otherwise negative.  Studies Reviewed: Sophia Ward    EKG:  EKG is ordered today, personally reviewed, demonstrating   EKG Interpretation Date/Time:  Thursday February 01 2024 11:33:57 EDT Ventricular Rate:  62 PR Interval:  156 QRS Duration:  66 QT Interval:  432 QTC Calculation: 438 R Axis:   51  Text Interpretation: Normal sinus rhythm Normal ECG Confirmed by Keano Guggenheim [phone]) on 02/01/2024 12:08:36 PM         CV Studies: Cardiac studies reviewed are outlined and summarized above. Otherwise please see EMR for full report.   Current Reported Medications:.    Current Meds  Medication Sig   amLODipine  (NORVASC ) 5 MG tablet  1 tablet Orally Once a day   apixaban  (ELIQUIS ) 5 MG TABS tablet Take 1 tablet by mouth twice daily   atorvastatin  (LIPITOR) 40 MG tablet Take 1 tablet (40 mg total) by mouth at bedtime.   B-D ULTRAFINE III SHORT PEN 31G X 8 MM MISC 1 each by Other route in the morning, at noon, in the evening, and at bedtime.    Calcium  Carbonate-Vitamin D (CALCIUM  + D PO) Take 1 tablet by mouth daily.    Cholecalciferol  (VITAMIN D3) 125 MCG (5000 UT) CAPS Take 5,000 Units by mouth daily.    clopidogrel  (PLAVIX ) 75 MG tablet Take 75 mg by mouth daily.   Continuous Glucose Receiver (FREESTYLE LIBRE READER) DEVI daily three times a day   desonide (DESOWEN) 0.05 % cream Apply 1 application  topically 2 (two) times daily as needed.   escitalopram  (LEXAPRO ) 10 MG tablet Take 10 mg by mouth daily.   estradiol  (ESTRACE ) 0.1 MG/GM vaginal cream Place 1 Applicatorful vaginally 2 (two) times a week.   fluticasone  (FLONASE ) 50 MCG/ACT nasal spray 1 spray in each nostril Nasally Once a day   gabapentin (NEURONTIN) 100 MG capsule Take 100 mg by mouth at bedtime.   insulin  glargine (LANTUS) 100 UNIT/ML injection 7 units Subcutaneous Once a day   leflunomide  (ARAVA ) 10 MG tablet TAKE 1 TABLET BY MOUTH EVERY OTHER DAY   metoprolol  succinate (TOPROL -XL) 100 MG 24 hr tablet TAKE 1 TABLET BY MOUTH IN THE MORNING   nitroGLYCERIN  (NITROSTAT ) 0.4 MG SL tablet DISSOLVE ONE TABLET UNDER THE TONGUE EVERY 5 MINUTES AS NEEDED FOR CHEST PAIN.  DO NOT EXCEED A TOTAL OF 3 DOSES IN 15 MINUTES   NOVOLOG  FLEXPEN 100 UNIT/ML FlexPen 6 units Subcutaneous up to 3 times daily; Duration: 30 days   Omega-3 Fatty Acids (FISH OIL PO) Take 1 capsule by mouth daily.    pantoprazole  (PROTONIX ) 40 MG tablet Take 1 tablet (40 mg total) by mouth daily after lunch.   predniSONE  (DELTASONE ) 1 MG tablet Take 2 tablets by mouth once daily   spironolactone  (ALDACTONE ) 25 MG tablet Take 1 tablet (25 mg total) by mouth daily.   torsemide  (DEMADEX ) 20 MG tablet Take 1 tablet (20 mg total) by mouth daily.   clopidogrel  (PLAVIX ) 75 MG tablet Take 1 tablet (75 mg) by mouth daily    Physical Exam:    VS:  BP (!) 120/58   Pulse 62   Ht 5' 2.5 (1.588 m)   Wt 173 lb (78.5 kg)   SpO2 94%   BMI 31.14 kg/m    Wt Readings from Last 3 Encounters:  02/01/24 173 lb (78.5 kg)  12/07/23 172 lb (78 kg)  07/31/23 176 lb 3.2 oz (79.9 kg)    GEN: Well nourished, well  developed in no acute distress NECK: No JVD; No carotid bruits CARDIAC: RRR, soft SEM. No rubs or gallops RESPIRATORY:  Clear to auscultation without rales, wheezing or rhonchi  ABDOMEN: Soft, non-tender, non-distended EXTREMITIES:  No edema; No acute deformity   Asessement and Plan:.    1. Chronic HFpEF, Pulmonary HTN -  it was previously felt that her pulmonary hypertension is largely due to left-sided heart disease w/ treatment management being diuretics. It was not believed she would benefit from evaluation for Group 1 pulmonary hypertension medications. She appears euvolemic on examination. No additional changes at this time. Current regimen includes amlodipine  5mg  daily, Toprol  100mg  daily, spironolactone  25mg  daily, torsemide  20mg  daily. F/u BMET planned today.  2.  CAD - no recent angina. Lipids have been managed by PCP. Do not feel strongly about intensifying statin therapy from current atorvastatin  40mg  dose. Continue to follow clinically. I previously confirmed with Dr. Dann that Plavix  75mg  daily was intended to be a longterm medication. Not on ASA due to concomitant Plavix /Eliquis . Recent Hgb by PCP stable.  3. H/o AS s/p TAVR - clinically stable. F/u echocardiogram for surveillance. SBE ppx reviewed. She uses azithromycin  due to PCN allergy. She denies needing refills, states her dentist calls this in. Told her to let us  know if ever an issue accessing this, we can send in as well.  4. Paroxysmal atrial fibrillation - maintaining NSR. Continue Eliquis  - 5mg  BID dose remains appropriate but will update labs today to reassess creatinine. Once/if she hits the 1.5 threshold, would reduce dose to 2.5mg  BID.    Disposition: F/u with Dr. Jeffrie in 6 months.  Signed, Riniyah Speich N Miski Feldpausch, PA-C

## 2024-02-01 ENCOUNTER — Encounter: Payer: Self-pay | Admitting: Physician Assistant

## 2024-02-01 ENCOUNTER — Ambulatory Visit: Attending: Physician Assistant | Admitting: Physician Assistant

## 2024-02-01 VITALS — BP 120/58 | HR 62 | Ht 62.5 in | Wt 173.0 lb

## 2024-02-01 DIAGNOSIS — I480 Paroxysmal atrial fibrillation: Secondary | ICD-10-CM | POA: Diagnosis not present

## 2024-02-01 DIAGNOSIS — I350 Nonrheumatic aortic (valve) stenosis: Secondary | ICD-10-CM | POA: Insufficient documentation

## 2024-02-01 DIAGNOSIS — I2510 Atherosclerotic heart disease of native coronary artery without angina pectoris: Secondary | ICD-10-CM | POA: Diagnosis not present

## 2024-02-01 DIAGNOSIS — I272 Pulmonary hypertension, unspecified: Secondary | ICD-10-CM | POA: Diagnosis not present

## 2024-02-01 DIAGNOSIS — I5032 Chronic diastolic (congestive) heart failure: Secondary | ICD-10-CM | POA: Insufficient documentation

## 2024-02-01 DIAGNOSIS — Z952 Presence of prosthetic heart valve: Secondary | ICD-10-CM | POA: Diagnosis not present

## 2024-02-01 NOTE — Patient Instructions (Addendum)
 Medication Instructions:  Your physician recommends that you continue on your current medications as directed. Please refer to the Current Medication list given to you today.  *If you need a refill on your cardiac medications before your next appointment, please call your pharmacy*  Lab Work: TODAY:  BMET  If you have labs (blood work) drawn today and your tests are completely normal, you will receive your results only by: MyChart Message (if you have MyChart) OR A paper copy in the mail If you have any lab test that is abnormal or we need to change your treatment, we will call you to review the results.  Testing/Procedures: Your physician has requested that you have an echocardiogram. Echocardiography is a painless test that uses sound waves to create images of your heart. It provides your doctor with information about the size and shape of your heart and how well your heart's chambers and valves are working. This procedure takes approximately one hour. There are no restrictions for this procedure. Please do NOT wear cologne, perfume, aftershave, or lotions (deodorant is allowed). Please arrive 15 minutes prior to your appointment time.  Please note: We ask at that you not bring children with you during ultrasound (echo/ vascular) testing. Due to room size and safety concerns, children are not allowed in the ultrasound rooms during exams. Our front office staff cannot provide observation of children in our lobby area while testing is being conducted. An adult accompanying a patient to their appointment will only be allowed in the ultrasound room at the discretion of the ultrasound technician under special circumstances. We apologize for any inconvenience.    Follow-Up: At [facility], you and your health needs are our priority.  As part of our continuing mission to provide you with exceptional heart care, our providers are all part of one team.  This team includes your primary  Cardiologist (physician) and Advanced Practice Providers or APPs (Physician Assistants and Nurse Practitioners) who all work together to provide you with the care you need, when you need it.  Your next appointment:   6 month(s)  Provider:   Oneil Parchment, MD    We recommend signing up for the patient portal called MyChart.  Sign up information is provided on this After Visit Summary.  MyChart is used to connect with patients for Virtual Visits (Telemedicine).  Patients are able to view lab/test results, encounter notes, upcoming appointments, etc.  Non-urgent messages can be sent to your provider as well.   To learn more about what you can do with MyChart, go to [email or web address].   Other Instructions  Endocarditis Information  You may be at risk for developing endocarditis since you have an artificial heart valve or a repaired heart valve. Endocarditis is an infection of the lining of the heart or heart valves. Certain surgical and dental procedures may put you at risk, such as teeth cleaning or other dental procedures or other medical procedures. Notify our office or your dentist before having any dental work or invasive/surgical procedures. You will need to take antibiotics before certain procedures. To prevent endocarditis, maintain good oral health. Seek prompt medical attention for any mouth/gum, skin or urinary tract infections.

## 2024-02-02 ENCOUNTER — Ambulatory Visit: Payer: Self-pay | Admitting: Physician Assistant

## 2024-02-02 DIAGNOSIS — Z79899 Other long term (current) drug therapy: Secondary | ICD-10-CM

## 2024-02-02 LAB — BASIC METABOLIC PANEL WITH GFR
BUN/Creatinine Ratio: 28 (ref 12–28)
BUN: 39 mg/dL — ABNORMAL HIGH (ref 8–27)
CO2: 22 mmol/L (ref 20–29)
Calcium: 9.8 mg/dL (ref 8.7–10.3)
Chloride: 100 mmol/L (ref 96–106)
Creatinine, Ser: 1.41 mg/dL — ABNORMAL HIGH (ref 0.57–1.00)
Glucose: 127 mg/dL — ABNORMAL HIGH (ref 70–99)
Potassium: 4.6 mmol/L (ref 3.5–5.2)
Sodium: 141 mmol/L (ref 134–144)
eGFR: 36 mL/min/1.73 — ABNORMAL LOW (ref >59)

## 2024-02-06 DIAGNOSIS — Z79899 Other long term (current) drug therapy: Secondary | ICD-10-CM | POA: Diagnosis not present

## 2024-02-06 LAB — HEMOGLOBIN AND HEMATOCRIT, BLOOD

## 2024-02-07 LAB — BASIC METABOLIC PANEL WITH GFR
BUN/Creatinine Ratio: 19 (ref 12–28)
BUN: 29 mg/dL — ABNORMAL HIGH (ref 8–27)
CO2: 20 mmol/L (ref 20–29)
Calcium: 9.8 mg/dL (ref 8.7–10.3)
Chloride: 103 mmol/L (ref 96–106)
Creatinine, Ser: 1.53 mg/dL — ABNORMAL HIGH (ref 0.57–1.00)
Glucose: 161 mg/dL — ABNORMAL HIGH (ref 70–99)
Potassium: 4.3 mmol/L (ref 3.5–5.2)
Sodium: 141 mmol/L (ref 134–144)
eGFR: 33 mL/min/1.73 — ABNORMAL LOW (ref >59)

## 2024-02-07 LAB — HEMOGLOBIN AND HEMATOCRIT, BLOOD
Hematocrit: 37.7 (ref 34.0–46.6)
Hemoglobin: 12 g/dL (ref 11.1–15.9)

## 2024-02-09 MED ORDER — APIXABAN 2.5 MG PO TABS: 2.5000 mg | ORAL_TABLET | Freq: Two times a day (BID) | ORAL | 3 refills | Status: DC

## 2024-02-21 DIAGNOSIS — H43811 Vitreous degeneration, right eye: Secondary | ICD-10-CM | POA: Diagnosis not present

## 2024-02-21 DIAGNOSIS — H43391 Other vitreous opacities, right eye: Secondary | ICD-10-CM | POA: Diagnosis not present

## 2024-02-21 DIAGNOSIS — Z8669 Personal history of other diseases of the nervous system and sense organs: Secondary | ICD-10-CM | POA: Diagnosis not present

## 2024-02-21 DIAGNOSIS — H59812 Chorioretinal scars after surgery for detachment, left eye: Secondary | ICD-10-CM | POA: Diagnosis not present

## 2024-02-21 DIAGNOSIS — H35363 Drusen (degenerative) of macula, bilateral: Secondary | ICD-10-CM | POA: Diagnosis not present

## 2024-02-21 DIAGNOSIS — H35373 Puckering of macula, bilateral: Secondary | ICD-10-CM | POA: Diagnosis not present

## 2024-02-28 ENCOUNTER — Other Ambulatory Visit: Payer: Self-pay | Admitting: Cardiology

## 2024-02-28 DIAGNOSIS — I480 Paroxysmal atrial fibrillation: Secondary | ICD-10-CM

## 2024-02-28 NOTE — Telephone Encounter (Addendum)
 Eliquis  5mg  refill request received. Patient is 89 years old, weight-78.5kg, Crea-1.53 on 02/06/24, Diagnosis-Afib, and last seen by Dayna Dunn on 02/01/24. Dose is inappropriate based on dosing criteria.  Last dose of eliquis  2.5mg  was filled on 9/15-25 due to dose decrease. Will deny refill at this time.   Per last OV note it states: Paroxysmal atrial fibrillation - maintaining NSR. Continue Eliquis  - 5mg  BID dose remains appropriate but will update labs today to reassess creatinine. Once/if she hits the 1.5 threshold, would reduce dose to 2.5mg  BID.   Last Creatinine levels: 02/06/24-1.53 02/01/24-1.41  11/16/23-1.45 04/04/23-1.36 12/19/22-1.14 11/04/22-1.55

## 2024-03-04 ENCOUNTER — Other Ambulatory Visit: Payer: Self-pay | Admitting: Rheumatology

## 2024-03-04 DIAGNOSIS — M0579 Rheumatoid arthritis with rheumatoid factor of multiple sites without organ or systems involvement: Secondary | ICD-10-CM

## 2024-03-04 DIAGNOSIS — Z79899 Other long term (current) drug therapy: Secondary | ICD-10-CM

## 2024-03-04 DIAGNOSIS — M48062 Spinal stenosis, lumbar region with neurogenic claudication: Secondary | ICD-10-CM | POA: Diagnosis not present

## 2024-03-04 NOTE — Telephone Encounter (Signed)
 Last Fill: 11/15/2023  Labs: 11/16/2023 Glucose is elevated at 152, creatinine is elevated at 1.45, most likely due to diuretic use. Please forward results to her PCP. White cell count is elevated and stable.   Next Visit: 05/09/2024  Last Visit: 12/07/2023  DX: Rheumatoid arthritis involving multiple sites with positive rheumatoid factor   Current Dose per office note 12/07/2023: Arava  10 mg 1 tab every other day   Okay to refill Arava  ?  Contacted patient to update labs, patient would like to have them done at [facility] next week when she goes for another appointment [facility]. Labs pended

## 2024-03-06 DIAGNOSIS — Z23 Encounter for immunization: Secondary | ICD-10-CM | POA: Diagnosis not present

## 2024-03-12 ENCOUNTER — Telehealth: Payer: Self-pay | Admitting: Rheumatology

## 2024-03-12 DIAGNOSIS — Z79899 Other long term (current) drug therapy: Secondary | ICD-10-CM

## 2024-03-12 DIAGNOSIS — M0579 Rheumatoid arthritis with rheumatoid factor of multiple sites without organ or systems involvement: Secondary | ICD-10-CM

## 2024-03-12 NOTE — Telephone Encounter (Signed)
 Pt called stating she will be having her labs done at [facility] (heart center) on 10/16 during her appt.

## 2024-03-12 NOTE — Telephone Encounter (Signed)
 Lab Orders released.

## 2024-03-14 ENCOUNTER — Ambulatory Visit (HOSPITAL_COMMUNITY)
Admission: RE | Admit: 2024-03-14 | Discharge: 2024-03-14 | Disposition: A | Discharge status: Home / Self Care | Source: Ambulatory Visit | Attending: Cardiology | Admitting: Cardiology

## 2024-03-14 DIAGNOSIS — Z952 Presence of prosthetic heart valve: Secondary | ICD-10-CM | POA: Diagnosis present

## 2024-03-14 DIAGNOSIS — Z79899 Other long term (current) drug therapy: Secondary | ICD-10-CM | POA: Diagnosis not present

## 2024-03-14 DIAGNOSIS — M0579 Rheumatoid arthritis with rheumatoid factor of multiple sites without organ or systems involvement: Secondary | ICD-10-CM | POA: Diagnosis not present

## 2024-03-14 LAB — ECHOCARDIOGRAM COMPLETE
AV Mean grad: 8.5 mmHg
AV Peak grad: 15.7 mmHg
Ao pk vel: 1.98 m/s
Area-P 1/2: 4.02 cm2
S' Lateral: 2.8 cm

## 2024-03-15 ENCOUNTER — Ambulatory Visit: Payer: Self-pay | Admitting: Physician Assistant

## 2024-03-15 LAB — CMP14+EGFR
ALT: 13 IU/L (ref 0–32)
AST: 22 IU/L (ref 0–40)
Albumin: 4.2 g/dL (ref 3.7–4.7)
Alkaline Phosphatase: 80 IU/L (ref 48–129)
BUN/Creatinine Ratio: 21 (ref 12–28)
BUN: 32 mg/dL — ABNORMAL HIGH (ref 8–27)
Bilirubin Total: 0.5 mg/dL (ref 0.0–1.2)
CO2: 24 mmol/L (ref 20–29)
Calcium: 10 mg/dL (ref 8.7–10.3)
Chloride: 100 mmol/L (ref 96–106)
Creatinine, Ser: 1.49 mg/dL — ABNORMAL HIGH (ref 0.57–1.00)
Globulin, Total: 3.1 g/dL (ref 1.5–4.5)
Glucose: 135 mg/dL — ABNORMAL HIGH (ref 70–99)
Potassium: 4.2 mmol/L (ref 3.5–5.2)
Sodium: 141 mmol/L (ref 134–144)
Total Protein: 7.3 g/dL (ref 6.0–8.5)
eGFR: 34 mL/min/1.73 — ABNORMAL LOW (ref >59)

## 2024-03-15 LAB — CBC WITH DIFFERENTIAL/PLATELET
Basophils Absolute: 0.1 x10E3/uL (ref 0.0–0.2)
Basos: 1 %
EOS (ABSOLUTE): 0.5 x10E3/uL — ABNORMAL HIGH (ref 0.0–0.4)
Eos: 5 %
Hematocrit: 40.4 % (ref 34.0–46.6)
Hemoglobin: 13 g/dL (ref 11.1–15.9)
Immature Grans (Abs): 0 x10E3/uL (ref 0.0–0.1)
Immature Granulocytes: 0 %
Lymphocytes Absolute: 1.7 x10E3/uL (ref 0.7–3.1)
Lymphs: 16 %
MCH: 29.7 pg (ref 26.6–33.0)
MCHC: 32.2 g/dL (ref 31.5–35.7)
MCV: 92 fL (ref 79–97)
Monocytes Absolute: 1.5 x10E3/uL — ABNORMAL HIGH (ref 0.1–0.9)
Monocytes: 13 %
Neutrophils Absolute: 7.3 x10E3/uL — ABNORMAL HIGH (ref 1.4–7.0)
Neutrophils: 65 %
Platelets: 269 x10E3/uL (ref 150–450)
RBC: 4.37 x10E6/uL (ref 3.77–5.28)
RDW: 12 % (ref 11.7–15.4)
WBC: 11.2 x10E3/uL — ABNORMAL HIGH (ref 3.4–10.8)

## 2024-03-15 NOTE — Progress Notes (Signed)
 WBC count remains elevated. Absolute neutrophils and monocytes remain elevated.   Glucose is 135.  Creatinine remains elevated-1.49 and GFR is low but stable-34.  Rest of CMP WNL

## 2024-03-19 DIAGNOSIS — M48062 Spinal stenosis, lumbar region with neurogenic claudication: Secondary | ICD-10-CM | POA: Diagnosis not present

## 2024-04-01 ENCOUNTER — Other Ambulatory Visit: Payer: Self-pay | Admitting: Physician Assistant

## 2024-04-01 DIAGNOSIS — M0579 Rheumatoid arthritis with rheumatoid factor of multiple sites without organ or systems involvement: Secondary | ICD-10-CM

## 2024-04-01 NOTE — Telephone Encounter (Signed)
 Last Fill: 03/04/2024  Labs: 03/14/2024 WBC count remains elevated. Absolute neutrophils and monocytes remain elevated.   Glucose is 135.  Creatinine remains elevated-1.49 and GFR is low but stable-34.  Rest of CMP WNL  Next Visit: 05/09/2024  Last Visit: 12/07/2023  DX: Rheumatoid arthritis involving multiple sites with positive rheumatoid factor (HCC)   Current Dose per office note 12/07/2023: Arava  10 mg 1 tab every other day.   Okay to refill Arava  ?

## 2024-04-17 ENCOUNTER — Other Ambulatory Visit: Payer: Self-pay | Admitting: Physician Assistant

## 2024-04-17 NOTE — Telephone Encounter (Signed)
 Last Fill: 01/08/2024  Next Visit: 05/09/2024  Last Visit: 12/07/2023  Dx: Rheumatoid arthritis involving multiple sites with positive rheumatoid factor   Current Dose per office note on 12/07/2023: prednisone  2 mg daily   Okay to refill Prednisone ?

## 2024-04-28 NOTE — Progress Notes (Unsigned)
 Office Visit Note  Patient: Sophia Ward             Date of Birth: 1932/10/04           MRN: [id number]             PCP: Ransom Other, MD Referring: Ransom Other, MD Visit Date: 05/09/2024 Occupation: Data Unavailable  Subjective:  No chief complaint on file.   History of Present Illness: [name] is a 89 y.o. female ***     Activities of Daily Living:  Patient reports morning stiffness for *** {minute/hour:19697}.   Patient {ACTIONS;DENIES/REPORTS:21021675::Denies} nocturnal pain.  Difficulty dressing/grooming: {ACTIONS;DENIES/REPORTS:21021675::Denies} Difficulty climbing stairs: {ACTIONS;DENIES/REPORTS:21021675::Denies} Difficulty getting out of chair: {ACTIONS;DENIES/REPORTS:21021675::Denies} Difficulty using hands for taps, buttons, cutlery, and/or writing: {ACTIONS;DENIES/REPORTS:21021675::Denies}  No Rheumatology ROS completed.   PMFS History:  Patient Active Problem List   Diagnosis Date Noted   Acute on chronic diastolic CHF (congestive heart failure) (HCC) 11/01/2022   CKD stage 3a, GFR 45-59 ml/min (HCC) 11/01/2022   PAF (paroxysmal atrial fibrillation) (HCC) 11/01/2022   Heart failure (HCC) 11/01/2022   Acute on chronic diastolic heart failure (HCC) 03/04/2020   S/P TAVR (transcatheter aortic valve replacement) 03/03/2020   Diabetes mellitus without complication (HCC)    Pulmonary nodules    Lesion of pancreas    Severe aortic stenosis    Pressure injury of skin 01/15/2020   Acute respiratory failure with hypoxia (HCC) 01/13/2020   Iron deficiency anemia 01/06/2020   Anemia 10/22/2019   Polymyalgia rheumatica 11/25/2016   Primary osteoarthritis of both hands 11/25/2016   Bilateral primary osteoarthritis of knee 11/25/2016   Osteopenia of multiple sites 11/25/2016   Chronic gout involving toe without tophus 10/28/2016   Osteoarthritis of lumbar spine 10/28/2016   History of gastroesophageal reflux (GERD) 10/27/2016    Rheumatoid arthritis involving multiple sites with positive rheumatoid factor (HCC) 10/14/2016   Coronary atherosclerosis of native coronary artery 01/30/2014   Mixed hyperlipidemia 01/30/2014   Essential hypertension, benign 01/30/2014   Obesity, unspecified 01/30/2014    Past Medical History:  Diagnosis Date   Allergic rhinitis    Anemia 09/2019   Arthritis    hands   BPPV (benign paroxysmal positional vertigo)    Coronary artery disease    Diabetes mellitus without complication (HCC)    Essential hypertension, benign 01/30/2014   GERD (gastroesophageal reflux disease)    Hypertension    Lesion of pancreas    Mixed hyperlipidemia 01/30/2014   Myocardial infarction (HCC) 2004   mild MI-no damage- had stents   Osteopenia    Post-menopausal    Pulmonary nodules    Rheumatoid arthritis (HCC)    S/P TAVR (transcatheter aortic valve replacement) 03/03/2020   s/p TAVR with a 23 mm Edwards S3U via the TF approach by Drs Wonda and Dusty   Severe aortic stenosis    Spinal stenosis    Stenosing tenosynovitis     Family History  Problem Relation Age of Onset   CAD Mother    Heart attack Mother    CAD Father    Heart Problems Brother        open heart surgery    Arthritis Daughter        psoriatic arthritis    Past Surgical History:  Procedure Laterality Date   ABDOMINAL HYSTERECTOMY     APPENDECTOMY     BIOPSY  10/25/2019   Procedure: BIOPSY;  Surgeon: Elicia Claw, MD;  Location: [facility];  Service: Gastroenterology;;   BIOPSY  10/26/2019   Procedure: BIOPSY;  Surgeon: Elicia Claw, MD;  Location: [facility];  Service: Gastroenterology;;   CARDIAC CATHETERIZATION  2004   carpel tunnel     COLONOSCOPY WITH PROPOFOL  N/A 10/30/2012   Procedure: COLONOSCOPY WITH PROPOFOL ;  Surgeon: Gladis MARLA Louder, MD;  Location: [facility];  Service: Endoscopy;  Laterality: N/A;   COLONOSCOPY WITH PROPOFOL  N/A 10/26/2019   Procedure: COLONOSCOPY WITH PROPOFOL ;  Surgeon:  Elicia Claw, MD;  Location: [facility];  Service: Gastroenterology;  Laterality: N/A;   CORONARY ANGIOPLASTY  2004   CORONARY ATHERECTOMY N/A 02/21/2020   Procedure: CORONARY ATHERECTOMY;  Surgeon: Dann Candyce RAMAN, MD;  Location: [facility] INVASIVE CV LAB;  Service: Cardiovascular;  Laterality: N/A;   CORONARY PRESSURE/FFR STUDY N/A 01/29/2020   Procedure: INTRAVASCULAR PRESSURE WIRE/FFR STUDY;  Surgeon: Dann Candyce RAMAN, MD;  Location: [facility] INVASIVE CV LAB;  Service: Cardiovascular;  Laterality: N/A;   CORONARY STENT INTERVENTION N/A 02/21/2020   Procedure: CORONARY STENT INTERVENTION;  Surgeon: Dann Candyce RAMAN, MD;  Location: [facility] INVASIVE CV LAB;  Service: Cardiovascular;  Laterality: N/A;   CORONARY ULTRASOUND/IVUS N/A 02/21/2020   Procedure: Intravascular Ultrasound/IVUS;  Surgeon: Dann Candyce RAMAN, MD;  Location: [facility] INVASIVE CV LAB;  Service: Cardiovascular;  Laterality: N/A;   DILATION AND CURETTAGE OF UTERUS     ESOPHAGOGASTRODUODENOSCOPY N/A 10/25/2019   Procedure: ESOPHAGOGASTRODUODENOSCOPY (EGD);  Surgeon: Elicia Claw, MD;  Location: [facility] ENDOSCOPY;  Service: Gastroenterology;  Laterality: N/A;   ESOPHAGOGASTRODUODENOSCOPY (EGD) WITH PROPOFOL  N/A 10/30/2012   Procedure: ESOPHAGOGASTRODUODENOSCOPY (EGD) WITH PROPOFOL ;  Surgeon: Gladis MARLA Louder, MD;  Location: [facility] ENDOSCOPY;  Service: Endoscopy;  Laterality: N/A;   EYE SURGERY     bilateral cataract with lens implant   EYE SURGERY     INJECTION OF SILICONE OIL Left 02/12/2019   Procedure: Injection Of Silicone Oil;  Surgeon: Tobie Baptist, MD;  Location: [facility] OR;  Service: Ophthalmology;  Laterality: Left;   left shouler surgery     PHOTOCOAGULATION WITH LASER Left 02/12/2019   Procedure: Photocoagulation With Laser;  Surgeon: Tobie Baptist, MD;  Location: [facility] OR;  Service: Ophthalmology;  Laterality: Left;   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 02/12/2019   Procedure: REPAIR OF COMPLEX TRACTION RETINAL  DETACHMENT;  Surgeon: Tobie Baptist, MD;  Location: [facility] OR;  Service: Ophthalmology;  Laterality: Left;   RIGHT/LEFT HEART CATH AND CORONARY ANGIOGRAPHY N/A 01/29/2020   Procedure: RIGHT/LEFT HEART CATH AND CORONARY ANGIOGRAPHY;  Surgeon: Dann Candyce RAMAN, MD;  Location: [facility] INVASIVE CV LAB;  Service: Cardiovascular;  Laterality: N/A;   TEE WITHOUT CARDIOVERSION N/A 03/03/2020   Procedure: TRANSESOPHAGEAL ECHOCARDIOGRAM (TEE);  Surgeon: Wonda Sharper, MD;  Location: [facility] OR;  Service: Open Heart Surgery;  Laterality: N/A;   TONSILLECTOMY     TRANSCATHETER AORTIC VALVE REPLACEMENT, TRANSFEMORAL  03/03/2020   TRANSCATHETER AORTIC VALVE REPLACEMENT, TRANSFEMORAL N/A 03/03/2020   Procedure: TRANSCATHETER AORTIC VALVE REPLACEMENT, TRANSFEMORAL USING EDWARDS SAPIEN 3 23 MM AORTIC VALVE.;  Surgeon: Wonda Sharper, MD;  Location: [facility] OR;  Service: Open Heart Surgery;  Laterality: N/A;   VITRECTOMY 25 GAUGE WITH SCLERAL BUCKLE Left 02/12/2019   Procedure: Vitrectomy 25 Gauge With Scleral Buckle;  Surgeon: Tobie Baptist, MD;  Location: [facility] OR;  Service: Ophthalmology;  Laterality: Left;   Social History   Tobacco Use   Smoking status: Never    Passive exposure: Never   Smokeless tobacco: Never  Vaping Use   Vaping status: Never Used  Substance Use Topics   Alcohol use: No   Drug  use: No   Social History   Social History Narrative   Not on file     Immunization History  Administered Date(s) Administered   Fluad Quad(high Dose 65+) 03/04/2020   INFLUENZA, HIGH DOSE SEASONAL PF 03/09/2014, 04/09/2015, 03/16/2016, 02/25/2017, 03/20/2018   PFIZER(Purple Top)SARS-COV-2 Vaccination 06/11/2019, 07/01/2019, 03/19/2020   Tdap 10/31/2022   Zoster Recombinant(Shingrix) 02/07/2018, 06/04/2018     Objective: Vital Signs: There were no vitals taken for this visit.   Physical Exam   Musculoskeletal Exam: ***  CDAI Exam: CDAI Score: -- Patient Global: --; Provider Global: -- Swollen: --;  Tender: -- Joint Exam 05/09/2024   No joint exam has been documented for this visit   There is currently no information documented on the homunculus. Go to the Rheumatology activity and complete the homunculus joint exam.  Investigation: No additional findings.  Imaging: No results found.  Recent Labs: Lab Results  Component Value Date   WBC 11.2 (H) 03/14/2024   HGB 13.0 03/14/2024   PLT 269 03/14/2024   NA 141 03/14/2024   K 4.2 03/14/2024   CL 100 03/14/2024   CO2 24 03/14/2024   GLUCOSE 135 (H) 03/14/2024   BUN 32 (H) 03/14/2024   CREATININE 1.49 (H) 03/14/2024   BILITOT 0.5 03/14/2024   ALKPHOS 80 03/14/2024   AST 22 03/14/2024   ALT 13 03/14/2024   PROT 7.3 03/14/2024   ALBUMIN 4.2 03/14/2024   CALCIUM  10.0 03/14/2024   GFRAA 66 10/16/2020    Speciality Comments: No specialty comments available.  Procedures:  No procedures performed Allergies: Codeine, Glimepiride, Jardiance  [empagliflozin ], Metformin and related, and Penicillins   Assessment / Plan:     Visit Diagnoses: Rheumatoid arthritis involving multiple sites with positive rheumatoid factor (HCC)  High risk medication use  Polymyalgia rheumatica  Primary osteoarthritis of both hands  Primary osteoarthritis of both knees  Primary osteoarthritis of both feet  History of humerus fracture  Degeneration of intervertebral disc of lumbar region without discogenic back pain or lower extremity pain  Osteopenia of multiple sites  Status post aortic valve replacement  History of hypertension  History of diabetes mellitus  History of hyperlipidemia  History of gastroesophageal reflux (GERD)  Chronic anticoagulation  Orders: No orders of the defined types were placed in this encounter.  No orders of the defined types were placed in this encounter.   Face-to-face time spent with patient was *** minutes. Greater than 50% of time was spent in counseling and coordination of care.  Follow-Up  Instructions: No follow-ups on file.   Waddell CHRISTELLA Craze, PA-C  Note - This record has been created using Dragon software.  Chart creation errors have been sought, but may not always  have been located. Such creation errors do not reflect on  the standard of medical care.

## 2024-05-09 ENCOUNTER — Ambulatory Visit: Attending: Physician Assistant | Admitting: Physician Assistant

## 2024-05-09 ENCOUNTER — Encounter: Payer: Self-pay | Admitting: Physician Assistant

## 2024-05-09 VITALS — BP 122/71 | HR 78 | Temp 98.0°F | Resp 14 | Ht 62.0 in | Wt 171.2 lb

## 2024-05-09 DIAGNOSIS — M8589 Other specified disorders of bone density and structure, multiple sites: Secondary | ICD-10-CM | POA: Diagnosis present

## 2024-05-09 DIAGNOSIS — Z7901 Long term (current) use of anticoagulants: Secondary | ICD-10-CM | POA: Insufficient documentation

## 2024-05-09 DIAGNOSIS — M353 Polymyalgia rheumatica: Secondary | ICD-10-CM | POA: Insufficient documentation

## 2024-05-09 DIAGNOSIS — M19042 Primary osteoarthritis, left hand: Secondary | ICD-10-CM | POA: Diagnosis present

## 2024-05-09 DIAGNOSIS — Z8639 Personal history of other endocrine, nutritional and metabolic disease: Secondary | ICD-10-CM | POA: Insufficient documentation

## 2024-05-09 DIAGNOSIS — M0579 Rheumatoid arthritis with rheumatoid factor of multiple sites without organ or systems involvement: Secondary | ICD-10-CM

## 2024-05-09 DIAGNOSIS — Z8679 Personal history of other diseases of the circulatory system: Secondary | ICD-10-CM | POA: Diagnosis present

## 2024-05-09 DIAGNOSIS — M19072 Primary osteoarthritis, left ankle and foot: Secondary | ICD-10-CM | POA: Diagnosis present

## 2024-05-09 DIAGNOSIS — M19071 Primary osteoarthritis, right ankle and foot: Secondary | ICD-10-CM

## 2024-05-09 DIAGNOSIS — M51369 Other intervertebral disc degeneration, lumbar region without mention of lumbar back pain or lower extremity pain: Secondary | ICD-10-CM | POA: Insufficient documentation

## 2024-05-09 DIAGNOSIS — M170 Bilateral primary osteoarthritis of knee: Secondary | ICD-10-CM | POA: Diagnosis present

## 2024-05-09 DIAGNOSIS — Z79899 Other long term (current) drug therapy: Secondary | ICD-10-CM | POA: Diagnosis present

## 2024-05-09 DIAGNOSIS — Z8719 Personal history of other diseases of the digestive system: Secondary | ICD-10-CM | POA: Insufficient documentation

## 2024-05-09 DIAGNOSIS — M19041 Primary osteoarthritis, right hand: Secondary | ICD-10-CM | POA: Diagnosis present

## 2024-05-09 DIAGNOSIS — Z952 Presence of prosthetic heart valve: Secondary | ICD-10-CM | POA: Diagnosis present

## 2024-05-09 DIAGNOSIS — Z8781 Personal history of (healed) traumatic fracture: Secondary | ICD-10-CM | POA: Diagnosis present

## 2024-05-09 NOTE — Patient Instructions (Signed)
 Standing Labs We placed an order today for your standing lab work.   Please have your standing labs drawn in Mid-January and every 3 months    Please have your labs drawn 2 weeks prior to your appointment so that the provider can discuss your lab results at your appointment, if possible.  Please note that you may see your imaging and lab results in MyChart before we have reviewed them. We will contact you once all results are reviewed. Please allow our office up to 72 hours to thoroughly review all of the results before contacting the office for clarification of your results.  WALK-IN LAB HOURS  Monday through Thursday from 8:00 am - 4:30 pm and Friday from 8:00 am-12:00 pm.  Patients with office visits requiring labs will be seen before walk-in labs.  You may encounter longer than normal wait times. Please allow additional time. Wait times may be shorter on  Monday and Thursday afternoons.  We do not book appointments for walk-in labs. We appreciate your patience and understanding with our staff.   Labs are drawn by [facility]. Please bring your co-pay at the time of your lab draw.  You may receive a bill from [facility] for your lab work.  Please note if you are on Hydroxychloroquine and and an order has been placed for a Hydroxychloroquine level,  you will need to have it drawn 4 hours or more after your last dose.  If you wish to have your labs drawn at another location, please call the office 24 hours in advance so we can fax the orders.  The office is located at [address], [place], [place] [address]   If you have any questions regarding directions or hours of operation,  please call [phone].   As a reminder, please drink plenty of water prior to coming for your lab work. Thanks!

## 2024-06-10 ENCOUNTER — Other Ambulatory Visit: Payer: Self-pay | Admitting: Physician Assistant

## 2024-06-10 ENCOUNTER — Other Ambulatory Visit: Payer: Self-pay

## 2024-06-10 DIAGNOSIS — M0579 Rheumatoid arthritis with rheumatoid factor of multiple sites without organ or systems involvement: Secondary | ICD-10-CM

## 2024-06-10 MED ORDER — LEFLUNOMIDE 10 MG PO TABS: 10.0000 mg | ORAL_TABLET | ORAL | 0 refills | Status: DC

## 2024-06-10 MED ORDER — APIXABAN 2.5 MG PO TABS: 2.5000 mg | ORAL_TABLET | Freq: Two times a day (BID) | ORAL | 1 refills | Status: AC

## 2024-06-10 NOTE — Telephone Encounter (Signed)
 Pt last saw Raphael Bring, PA on 02/01/24, last labs 03/14/24 Creat 1.49, age 89, weight 77.7kg, based on specified criteria pt is on appropriate dosage of Eliquis  2.5mg  BID for afib.  Will refill rx.

## 2024-06-10 NOTE — Telephone Encounter (Signed)
 Refill request received via fax from [facility] for Arava    Last Fill: 04/01/2024  Labs: 03/14/2024 WBC count remains elevated. Absolute neutrophils and monocytes remain elevated.   Glucose is 135.  Creatinine remains elevated-1.49 and GFR is low but stable-34.  Rest of CMP WNL  Next Visit: 10/07/2024  Last Visit: 05/09/2024  DX: Rheumatoid arthritis involving multiple sites with positive rheumatoid factor (HCC)   Current Dose per office note 05/09/2024: Arava  10 mg 1 tab every other day   Okay to refill Arava  ?

## 2024-06-18 ENCOUNTER — Other Ambulatory Visit: Payer: Self-pay | Admitting: Physician Assistant

## 2024-06-18 DIAGNOSIS — M0579 Rheumatoid arthritis with rheumatoid factor of multiple sites without organ or systems involvement: Secondary | ICD-10-CM

## 2024-06-19 ENCOUNTER — Telehealth: Payer: Self-pay

## 2024-06-19 NOTE — Telephone Encounter (Signed)
 Patient contacted the office for a refill of Arave and Prednisone . Advised the patient her Prednisone  will not be due until the middle of February and the Arava  was filled on 06/10/2024. Patient verbalized understanding. Did advise the patient she is due to update CBC and CMP. Patient states she will try to get her labs done this week at the office. CBC and CMP already standing in order review.

## 2024-06-27 ENCOUNTER — Other Ambulatory Visit: Payer: Self-pay | Admitting: *Deleted

## 2024-06-27 DIAGNOSIS — Z79899 Other long term (current) drug therapy: Secondary | ICD-10-CM

## 2024-06-27 LAB — COMPREHENSIVE METABOLIC PANEL WITH GFR
AG Ratio: 1.2 (calc) (ref 1.0–2.5)
ALT: 9 U/L (ref 6–29)
AST: 12 U/L (ref 10–35)
Albumin: 3.9 g/dL (ref 3.6–5.1)
Alkaline phosphatase (APISO): 72 U/L (ref 37–153)
BUN/Creatinine Ratio: 14 (calc) (ref 6–22)
BUN: 26 mg/dL — ABNORMAL HIGH (ref 7–25)
CO2: 30 mmol/L (ref 20–32)
Calcium: 9.3 mg/dL (ref 8.6–10.4)
Chloride: 101 mmol/L (ref 98–110)
Creat: 1.88 mg/dL — ABNORMAL HIGH (ref 0.60–0.95)
Globulin: 3.2 g/dL (ref 1.9–3.7)
Glucose, Bld: 114 mg/dL — ABNORMAL HIGH (ref 65–99)
Potassium: 4.6 mmol/L (ref 3.5–5.3)
Sodium: 140 mmol/L (ref 135–146)
Total Bilirubin: 0.5 mg/dL (ref 0.2–1.2)
Total Protein: 7.1 g/dL (ref 6.1–8.1)
eGFR: 25 mL/min/{1.73_m2} — ABNORMAL LOW

## 2024-06-27 LAB — CBC WITH DIFFERENTIAL/PLATELET
Absolute Lymphocytes: 1600 {cells}/uL (ref 850–3900)
Absolute Monocytes: 1625 {cells}/uL — ABNORMAL HIGH (ref 200–950)
Basophils Absolute: 90 {cells}/uL (ref 0–200)
Basophils Relative: 0.7 %
Eosinophils Absolute: 439 {cells}/uL (ref 15–500)
Eosinophils Relative: 3.4 %
HCT: 36.5 % (ref 35.9–46.0)
Hemoglobin: 11.9 g/dL (ref 11.7–15.5)
MCH: 30.1 pg (ref 27.0–33.0)
MCHC: 32.6 g/dL (ref 31.6–35.4)
MCV: 92.4 fL (ref 81.4–101.7)
MPV: 10.7 fL (ref 7.5–12.5)
Monocytes Relative: 12.6 %
Neutro Abs: 9146 {cells}/uL — ABNORMAL HIGH (ref 1500–7800)
Neutrophils Relative %: 70.9 %
Platelets: 270 10*3/uL (ref 140–400)
RBC: 3.95 Million/uL (ref 3.80–5.10)
RDW: 12.1 % (ref 11.0–15.0)
Total Lymphocyte: 12.4 %
WBC: 12.9 10*3/uL — ABNORMAL HIGH (ref 3.8–10.8)

## 2024-07-01 ENCOUNTER — Ambulatory Visit: Payer: Self-pay | Admitting: Rheumatology

## 2024-07-01 NOTE — Progress Notes (Signed)
 Creatinine is higher than before (could be due to diuretic use).  White cell count is elevated.  Please forward results to her PCP and to her nephrologist if she has one.  Patient should contact her PCP regarding higher creatinine.

## 2024-07-03 ENCOUNTER — Other Ambulatory Visit: Payer: Self-pay

## 2024-07-03 DIAGNOSIS — M0579 Rheumatoid arthritis with rheumatoid factor of multiple sites without organ or systems involvement: Secondary | ICD-10-CM

## 2024-07-03 MED ORDER — LEFLUNOMIDE 10 MG PO TABS: 10.0000 mg | ORAL_TABLET | ORAL | 0 refills | Status: AC

## 2024-07-03 NOTE — Telephone Encounter (Signed)
 Refill request received via fax from [facility] for Arava   Last Fill: 06/10/2024  Labs: 06/27/2024 Creatinine is higher than before (could be due to diuretic use).  White cell count is elevated.  Please forward results to her PCP and to her nephrologist if she has one.  Patient should contact her PCP regarding higher creatinine.  Next Visit: 10/07/2024  Last Visit: 05/09/2024  DX: Rheumatoid arthritis involving multiple sites with positive rheumatoid factor (HCC)   Current Dose per office note 05/09/2024: Arava  10 mg 1 tab every other day.   Okay to refill Arava  ?

## 2024-09-02 ENCOUNTER — Ambulatory Visit: Admitting: Cardiology

## 2024-10-07 ENCOUNTER — Ambulatory Visit: Admitting: Physician Assistant
